# Patient Record
Sex: Female | Born: 1940
Health system: Southern US, Community
[De-identification: ages and names within clinical notes are randomized; demographics above are authoritative.]

## PROBLEM LIST (undated history)

## (undated) DIAGNOSIS — I499 Cardiac arrhythmia, unspecified: Secondary | ICD-10-CM

## (undated) DIAGNOSIS — I482 Chronic atrial fibrillation, unspecified: Secondary | ICD-10-CM

## (undated) DIAGNOSIS — R609 Edema, unspecified: Secondary | ICD-10-CM

## (undated) DIAGNOSIS — R011 Cardiac murmur, unspecified: Secondary | ICD-10-CM

## (undated) DIAGNOSIS — R0600 Dyspnea, unspecified: Secondary | ICD-10-CM

## (undated) DIAGNOSIS — R569 Unspecified convulsions: Secondary | ICD-10-CM

## (undated) DIAGNOSIS — B019 Varicella without complication: Secondary | ICD-10-CM

## (undated) DIAGNOSIS — N329 Bladder disorder, unspecified: Secondary | ICD-10-CM

## (undated) DIAGNOSIS — I10 Essential (primary) hypertension: Secondary | ICD-10-CM

## (undated) DIAGNOSIS — I639 Cerebral infarction, unspecified: Secondary | ICD-10-CM

## (undated) DIAGNOSIS — M419 Scoliosis, unspecified: Secondary | ICD-10-CM

## (undated) DIAGNOSIS — A419 Sepsis, unspecified organism: Secondary | ICD-10-CM

## (undated) DIAGNOSIS — J301 Allergic rhinitis due to pollen: Secondary | ICD-10-CM

## (undated) DIAGNOSIS — I82409 Acute embolism and thrombosis of unspecified deep veins of unspecified lower extremity: Secondary | ICD-10-CM

## (undated) DIAGNOSIS — I4891 Unspecified atrial fibrillation: Secondary | ICD-10-CM

## (undated) DIAGNOSIS — M1990 Unspecified osteoarthritis, unspecified site: Secondary | ICD-10-CM

## (undated) HISTORY — DX: Allergic rhinitis due to pollen: J30.1

## (undated) HISTORY — PX: COLONOSCOPY: SHX174

## (undated) HISTORY — PX: BREAST SURGERY: SHX581

## (undated) HISTORY — DX: Chronic atrial fibrillation, unspecified: I48.20

## (undated) HISTORY — DX: Unspecified atrial fibrillation: I48.91

## (undated) HISTORY — DX: Cardiac arrhythmia, unspecified: I49.9

## (undated) HISTORY — PX: DILATION AND CURETTAGE OF UTERUS: SHX78

## (undated) HISTORY — PX: LAMINECTOMY: SHX219

## (undated) HISTORY — DX: Scoliosis, unspecified: M41.9

## (undated) HISTORY — PX: ABLATION: SHX5711

## (undated) HISTORY — PX: TONSILLECTOMY: SHX5217

## (undated) HISTORY — PX: TOTAL KNEE ARTHROPLASTY: SHX125

## (undated) HISTORY — DX: Unspecified convulsions: R56.9

## (undated) HISTORY — PX: JOINT REPLACEMENT: SHX530

## (undated) HISTORY — DX: Cerebral infarction, unspecified: I63.9

## (undated) HISTORY — PX: BREAST BIOPSY: SHX20

## (undated) HISTORY — DX: Acute embolism and thrombosis of unspecified deep veins of unspecified lower extremity: I82.409

## (undated) HISTORY — DX: Varicella without complication: B01.9

## (undated) HISTORY — DX: Essential (primary) hypertension: I10

---
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
## 2001-10-23 HISTORY — PX: OTHER SURGICAL HISTORY: SHX169

## 2005-10-23 HISTORY — PX: OTHER SURGICAL HISTORY: SHX169

## 2005-10-23 HISTORY — PX: CARDIAC ELECTROPHYSIOLOGY MAPPING AND ABLATION: SHX1292

## 2007-06-12 ENCOUNTER — Ambulatory Visit: Payer: Self-pay | Admitting: General Practice

## 2007-07-12 ENCOUNTER — Ambulatory Visit: Payer: Self-pay | Admitting: General Practice

## 2007-07-26 ENCOUNTER — Ambulatory Visit: Payer: Self-pay | Admitting: General Practice

## 2009-01-19 ENCOUNTER — Encounter: Payer: Self-pay | Admitting: Cardiovascular Disease

## 2009-01-19 ENCOUNTER — Ambulatory Visit: Payer: Self-pay | Admitting: Cardiovascular Disease

## 2009-05-11 ENCOUNTER — Telehealth: Payer: Self-pay | Admitting: Cardiovascular Disease

## 2009-06-08 ENCOUNTER — Encounter: Payer: Self-pay | Admitting: Cardiovascular Disease

## 2009-06-29 ENCOUNTER — Ambulatory Visit: Payer: Self-pay | Admitting: Cardiovascular Disease

## 2009-06-29 DIAGNOSIS — I482 Chronic atrial fibrillation, unspecified: Secondary | ICD-10-CM | POA: Insufficient documentation

## 2009-06-29 DIAGNOSIS — R0602 Shortness of breath: Secondary | ICD-10-CM | POA: Insufficient documentation

## 2009-07-14 ENCOUNTER — Encounter: Payer: Self-pay | Admitting: Cardiovascular Disease

## 2009-07-14 LAB — CONVERTED CEMR LAB
BUN: 18 mg/dL
CO2: 22 meq/L
Calcium: 9.9 mg/dL
Chloride: 101 meq/L
Cholesterol: 197 mg/dL
Creatinine, Ser: 0.88 mg/dL
HDL: 56 mg/dL
LDL Cholesterol: 118 mg/dL
Potassium: 4.2 meq/L
Sodium: 139 meq/L
TSH: 2.27 microintl units/mL
Triglycerides: 117 mg/dL

## 2009-07-21 ENCOUNTER — Encounter: Payer: Self-pay | Admitting: Cardiovascular Disease

## 2009-09-07 ENCOUNTER — Encounter: Payer: Self-pay | Admitting: Cardiovascular Disease

## 2009-10-06 ENCOUNTER — Ambulatory Visit: Payer: Self-pay | Admitting: Surgery

## 2010-11-03 ENCOUNTER — Encounter: Payer: Self-pay | Admitting: Cardiovascular Disease

## 2010-11-22 NOTE — Progress Notes (Signed)
Summary: APPT  Phone Note Call from Patient Call back at Home Phone [phone]   Caller: SELF Call For: COOPER Summary of Call: PAT NEEDS AN APPT WITH A VEIN SPECIALIST BECAUSE THE STOCKINGS THAT COOPER PRESCRIBED DID NOT HELP. Initial call taken by: Harlon Flor,  May 11, 2009 10:44 AM  Follow-up for Phone Call        COOPER SAID TO REFER, WE WILL FOLLOW UP WITH DEW AND HEARN FOR AN APPOINTMENT. [facility] :) Follow-up by: Norva Karvonen, MD,  May 12, 2009 3:16 PM  Additional Follow-up for Phone Call Additional follow up Details #1::        noted, thanks.     Appended Document: APPT pt has appointment set up with dr dew 08/17 @ 9:30 am. We will send over the last O/V note. Cala Bradford :)

## 2010-11-22 NOTE — Letter (Signed)
Summary: Letter from [facility]  Letter from [facility]   Imported By: Harlon Flor 06/17/2009 11:48:45  _____________________________________________________________________  External Attachment:    Type:   Image     Comment:   External Document

## 2010-11-22 NOTE — Assessment & Plan Note (Signed)
Summary: f69m   Visit Type:  Follow-up Primary Provider:  Sherrie Mustache @ [facility]  CC:  6 month ROV;SOB/DOE.  History of Present Illness: This is a 70 year-old woman with chronic atrial fibrillation, HTN, and painful leg varicosities, presenting for follow-up today.  She fell onto her right chest one week ago, and has been feeling short of breath sporadically since then. She also c/o right sided chest pain with movement and deep breathing. Symptoms are improving slowly. No chest pressure, lightheadedness, edema, or syncope. Reports episodic fatigue that she relates to atrial fib. This occurs in spells, but denies palpitations or a feeling that her heart is racing. Has an upcoming appointment with Dr Wyn Quaker for a venous ultrasound to determine whether she is a candidate for treatment of her varicose veins.  No other complaints today.  Current Medications (verified): 1)  Metoprolol Tartrate 50 Mg Tabs (Metoprolol Tartrate) .... Take 1 1/2 Tablet By Mouth Twice A Day 2)  Warfarin Sodium 4 Mg Tabs (Warfarin Sodium) .... Use As Directed By Anticoagulation Clinic 3)  Co Q-10 100 Mg Caps (Coenzyme Q10) .Marland Kitchen.. 1 By Mouth Daily 4)  Cvs Vitamin E 400 Unit Caps (Vitamin E) .Marland Kitchen.. 1 By Mouth Daily 5)  Dilt-Cd 120 Mg Xr24h-Cap (Diltiazem Hcl Coated Beads) .Marland Kitchen.. 1 By Mouth Daily 6)  Digoxin 0.125 Mg Tabs (Digoxin) .Marland Kitchen.. 1 By Mouth Daily 7)  B Complex 100  Tabs (B Complex Vitamins) .Marland Kitchen.. 1 By Mouth Daily 8)  Vitamin C Cr 500 Mg Cr-Caps (Ascorbic Acid) .Marland Kitchen.. 1 By Mouth Daily 9)  Vitamin D 400mg  .... 1 By Mouth Daily 10)  All Day Calcium 600-500 Mg-Unit Xr24h-Tab (Calcium Carb-Cholecalciferol) .... 2 By Mouth Daily 11)  Eql Fish Oil 1000 Mg Caps (Omega-3 Fatty Acids) .Marland Kitchen.. 1 By Mouth Daily 12)  Probiotic Formula  Caps (Misc Intestinal Flora Regulat) .Marland Kitchen.. 1 By Mouth Daily 13)  Aspartate Mg & K 90-90 Mg Caps (Mag Aspart-Potassium Aspart) .Marland Kitchen.. 1 By Mouth Daily 14)  Chromium Picolinate Klb6  Tabs (Misc  Natural Products) .Marland Kitchen.. 1 By Mouth Daily (On Occasion) 15)  Acai Berry 500 Mg Caps (Acai) .... Take 2 Once Daily  Allergies (verified): No Known Drug Allergies  Past History:  Past medical history reviewed for relevance to current acute and chronic problems.  Past Medical History: Reviewed history from 01/19/2009 and no changes required. Chronic A-Fib Hx: DVT Hypertension  Review of Systems       Negative except as per HPI   Vital Signs:  Patient profile:   70 year old female Height:      62 inches Weight:      163 pounds BMI:     29.92 Pulse rate:   72 / minute BP sitting:   126 / 68  (left arm)  Vitals Entered By: Stanton Kidney, EMT-P (June 29, 2009 12:22 PM)  Physical Exam  General:  Pt is alert and oriented, in no acute distress. HEENT: normal Neck: normal carotid upstrokes without bruits, JVP normal Lungs: CTA CV: irregularly irregular without murmur or gallop Abd: soft, NT, positive BS, no bruit, no organomegaly Ext: no clubbing, cyanosis, or edema. peripheral pulses 2+ and equal. Lower extremity varicosities noted bilaterally. Skin: warm and dry without rash    EKG  Procedure date:  06/29/2009  Findings:      Atrial fibrillation, otherwise within normal limits.  Impression & Recommendations:  Problem # 1:  ATRIAL FIBRILLATION (ICD-427.31) Remains stable with apparent good rate-control. Anticoagulated chronically with coumadin.  She has upcoming physical with her PCP. Will ask that Dig level be drawn with her annual bloodwork. I discussed the option of an Event Recorder to determine if her episodic fatigue is related to herat rhythm, but she declined.  Her updated medication list for this problem includes:    Metoprolol Tartrate 50 Mg Tabs (Metoprolol tartrate) .Marland Kitchen... Take 1 1/2 tablet by mouth twice a day    Warfarin Sodium 4 Mg Tabs (Warfarin sodium) ..... Use as directed by anticoagulation clinic    Digoxin 0.125 Mg Tabs (Digoxin) .Marland Kitchen... 1 by mouth  daily  Orders: T-Digoxin ([address]-60454) EKG w/ Interpretation (93000)  Problem # 2:  SHORTNESS OF BREATH (ICD-786.05) Symptoms have occurred since her fall, and she also has pleuritic chest pain. Will check chest XRay with rib series to rule out rib fracture or other abnormality.  Her updated medication list for this problem includes:    Metoprolol Tartrate 50 Mg Tabs (Metoprolol tartrate) .Marland Kitchen... Take 1 1/2 tablet by mouth twice a day    Dilt-cd 120 Mg Xr24h-cap (Diltiazem hcl coated beads) .Marland Kitchen... 1 by mouth daily    Digoxin 0.125 Mg Tabs (Digoxin) .Marland Kitchen... 1 by mouth daily  Orders: T-2 View CXR (71020TC)  Patient Instructions: 1)  Your physician recommends that you schedule a follow-up appointment in: 6 months 2)  Your physician recommends that you get Dig level drawn by your primary care doctor.  3)  Your physician recommends that you continue on your current medications as directed. Please refer to the Current Medication list given to you today. 4)  A chest x-ray takes a picture of the organs and structures inside the chest, including the heart, lungs, and blood vessels. This test can show several things, including, whether the heart is enlarged; whether fluid is building up in the lungs; and whether pacemaker / defibrillator leads are still in place. Prescriptions: DIGOXIN 0.125 MG TABS (DIGOXIN) 1 by mouth daily  #90 x 3   Entered by:   Mercer Pod   Authorized by:   Norva Karvonen, MD   Signed by:   Mercer Pod on 06/29/2009   Method used:   Electronically to        [facility] [address] (retail)       [address], [address]       [place], [place]  [address]       Ph: [phone]       Fax: [phone]   RxID:   [id number] DILT-CD 120 MG XR24H-CAP (DILTIAZEM HCL COATED BEADS) 1 by mouth daily  #90 x 3   Entered by:   Mercer Pod   Authorized by:   Norva Karvonen, MD   Signed by:   Mercer Pod on  06/29/2009   Method used:   Electronically to        [facility] [address] (retail)       [address], [address]       [place], [place]  [address]       Ph: [phone]       Fax: [phone]   RxID:   [id number] METOPROLOL TARTRATE 50 MG TABS (METOPROLOL TARTRATE) Take 1 1/2 tablet by mouth twice a day  #270 x 3   Entered by:   Mercer Pod   Authorized by:   Norva Karvonen, MD   Signed by:   Mercer Pod  on 06/29/2009   Method used:   Electronically to        [facility] [address] (retail)       [address]       [place], [place]  [address]       Ph: [phone]       Fax: [phone]   RxID:   [id number]

## 2010-11-22 NOTE — Assessment & Plan Note (Signed)
Summary: [facility]   Visit Type:  New Patient Referring Provider:  Merrily Pew Primary Provider:  Sherrie Mustache @ [facility]   History of Present Illness: see dictated office note  Current Medications (verified): 1)  Metoprolol Tartrate 50 Mg Tabs (Metoprolol Tartrate) .... Take 1 1/2 Tablet By Mouth Twice A Day 2)  Warfarin Sodium 4 Mg Tabs (Warfarin Sodium) .... Use As Directed By Anticoagulation Clinic 3)  Co Q-10 100 Mg Caps (Coenzyme Q10) .Marland Kitchen.. 1 By Mouth Daily 4)  Cvs Vitamin E 400 Unit Caps (Vitamin E) .Marland Kitchen.. 1 By Mouth Daily 5)  Dilt-Cd 120 Mg Xr24h-Cap (Diltiazem Hcl Coated Beads) .Marland Kitchen.. 1 By Mouth Daily 6)  Celebrex 200 Mg Caps (Celecoxib) .Marland Kitchen.. 1 By Mouth Daily As Needed 7)  Digoxin 0.125 Mg Tabs (Digoxin) .Marland Kitchen.. 1 By Mouth Daily 8)  Vitamin A 8000iu .Marland Kitchen.. 1 By Mouth Daily 9)  B Complex 100  Tabs (B Complex Vitamins) .Marland Kitchen.. 1 By Mouth Daily 10)  Vitamin C Cr 500 Mg Cr-Caps (Ascorbic Acid) .Marland Kitchen.. 1 By Mouth Daily 11)  Vitamin D 400mg  .... 1 By Mouth Daily 12)  All Day Calcium 600-500 Mg-Unit Xr24h-Tab (Calcium Carb-Cholecalciferol) .... 2 By Mouth Daily 13)  Eql Fish Oil 1000 Mg Caps (Omega-3 Fatty Acids) .Marland Kitchen.. 1 By Mouth Daily 14)  Tonalin 1 Gram .... 1 By Mouth Daily 15)  Probiotic Formula  Caps (Misc Intestinal Flora Regulat) .Marland Kitchen.. 1 By Mouth Daily 16)  Aspartate Mg & K 90-90 Mg Caps (Mag Aspart-Potassium Aspart) .Marland Kitchen.. 1 By Mouth Daily 17)  Chromium Picolinate Klb6  Tabs (Misc Natural Products) .Marland Kitchen.. 1 By Mouth Daily  Allergies (verified): No Known Drug Allergies  Vital Signs:  Patient profile:   70 year old female Height:      5.3 inches Weight:      168.25 pounds BMI:     4226.42 Pulse rate:   66 / minute Pulse rhythm:   irregular BP sitting:   116 / 72  (left arm) Cuff size:   regular  Vitals Entered By: Mercer Pod (January 19, 2009 11:39 AM)

## 2010-11-22 NOTE — Miscellaneous (Signed)
Summary: labs from Dr Sherrie Mustache  Clinical Lists Changes  Observations: Added new observation of TSH: 2.270 microintl units/mL (07/14/2009 16:14) Added new observation of CALCIUM: 9.9 mg/dL (98/08/9146 82:95) Added new observation of CO2 PLSM/SER: 22 meq/L (07/14/2009 16:14) Added new observation of CL SERUM: 101 meq/L (07/14/2009 16:14) Added new observation of K SERUM: 4.2 meq/L (07/14/2009 16:14) Added new observation of NA: 139 meq/L (07/14/2009 16:14) Added new observation of CREATININE: .88 mg/dL (62/13/0865 78:46) Added new observation of BUN: 18 mg/dL (96/29/5284 13:24) Added new observation of LDL: 118 mg/dL (40/07/2724 36:64) Added new observation of HDL: 56 mg/dL (40/34/7425 95:63) Added new observation of TRIGLYC TOT: 117 mg/dL (87/56/4332 95:18) Added new observation of CHOLESTEROL: 197 mg/dL (84/16/6063 01:60)

## 2010-11-22 NOTE — Progress Notes (Signed)
Summary: [facility]  [facility]   Imported By: Harlon Flor 09/21/2009 12:31:37  _____________________________________________________________________  External Attachment:    Type:   Image     Comment:   External Document

## 2010-12-07 ENCOUNTER — Ambulatory Visit (INDEPENDENT_AMBULATORY_CARE_PROVIDER_SITE_OTHER): Payer: Medicare Other | Admitting: Cardiovascular Disease

## 2010-12-07 ENCOUNTER — Encounter: Payer: Self-pay | Admitting: Cardiovascular Disease

## 2010-12-07 DIAGNOSIS — I10 Essential (primary) hypertension: Secondary | ICD-10-CM

## 2010-12-07 DIAGNOSIS — I4891 Unspecified atrial fibrillation: Secondary | ICD-10-CM

## 2010-12-14 NOTE — Assessment & Plan Note (Signed)
Summary: NP-ESTABLISH CARE/SAB   Visit Type:  Initial Consult Referring Provider:  Merrily Pew Primary Provider:  Sherrie Mustache @ [facility]  CC:  Occas. has shortness of breath..  History of Present Illness: This is a 70 year-old woman with chronic atrial fibrillation, atrial flutter ablation, HTN, and painful leg varicosities, presenting for follow-up today.  she does have chronic fatigue in the evenings. This has been coming on for one year. She works out at [employer] 3-4 times per week and has no symptoms with shortness of breath or chest pain. She is stressed as she lives with her daughter and 4 grandchildren. No tachycardia palpitations. She did reduce her metoprolol tartrate from 75 mg b.i.d. to 50 mg b.i.d. secondary to hypotension and fatigue.  No other complaints today.  old EKG done at [facility] family practice shows atrial fibrillation with ventricular rate 74 beats per minute, no significant ST or T wave changes, left axis deviation  Preventive Screening-Counseling & Management  Alcohol-Tobacco     Smoking Status: never  Caffeine-Diet-Exercise     Does Patient Exercise: yes  Current Medications (verified): 1)  Metoprolol Tartrate 50 Mg Tabs (Metoprolol Tartrate) .... Take 1 1/2 Tablet By Mouth Twice A Day 2)  Warfarin Sodium 5 Mg Tabs (Warfarin Sodium) .... Take As Directed By Pcp 3)  Co Q-10 100 Mg Caps (Coenzyme Q10) .Marland Kitchen.. 1 By Mouth Daily 4)  Cvs Vitamin E 400 Unit Caps (Vitamin E) .Marland Kitchen.. 1 By Mouth Daily 5)  Dilt-Cd 120 Mg Xr24h-Cap (Diltiazem Hcl Coated Beads) .Marland Kitchen.. 1 By Mouth Daily 6)  Digoxin 0.125 Mg Tabs (Digoxin) .Marland Kitchen.. 1 By Mouth Daily 7)  B Complex 100  Tabs (B Complex Vitamins) .Marland Kitchen.. 1 By Mouth Daily 8)  Vitamin C Cr 500 Mg Cr-Caps (Ascorbic Acid) .Marland Kitchen.. 1 By Mouth Daily 9)  Vitamin D 400mg  .... 1 By Mouth Daily 10)  All Day Calcium 600-500 Mg-Unit Xr24h-Tab (Calcium Carb-Cholecalciferol) .... 2 By Mouth Daily 11)  Eql Fish Oil 1000 Mg Caps (Omega-3  Fatty Acids) .Marland Kitchen.. 1 By Mouth Daily 12)  Probiotic Formula  Caps (Misc Intestinal Flora Regulat) .Marland Kitchen.. 1 By Mouth Daily 13)  Aspartate Mg & K 90-90 Mg Caps (Mag Aspart-Potassium Aspart) .Marland Kitchen.. 1 By Mouth Daily 14)  Chromium Picolinate Klb6  Tabs (Misc Natural Products) .Marland Kitchen.. 1 By Mouth Daily (On Occasion) 15)  Gabapentin 100 Mg Caps (Gabapentin) .... One Capsule Two Times A Day  Allergies (verified): No Known Drug Allergies  Past History:  Family History: Last updated: 2011/01/02 Father: Deceased; unknown age 21 Mother:Deceased age 63  Social History: Last updated: 01/02/11 Retired  Divorced  Tobacco Use - No.  Alcohol Use - yes--wine occas. Regular Exercise - yes--[employer] 3 x weekly; walks her dog.  Risk Factors: Exercise: yes (02-Jan-2011)  Risk Factors: Smoking Status: never (01-02-11)  Past Medical History: Chronic A-Fib--Ablation 2007 Hx: DVT Hypertension  Past Surgical History: Ablastion 2007; [place] tonsillectomy bilateral wrist surgery 2003  Family History: Father: Deceased; unknown age 17 Mother:Deceased age 58  Social History: Retired  Divorced  Tobacco Use - No.  Alcohol Use - yes--wine occas. Regular Exercise - yes--[employer] 3 x weekly; walks her dog. Smoking Status:  never Does Patient Exercise:  yes  Review of Systems  The patient denies fever, weight loss, weight gain, vision loss, decreased hearing, hoarseness, chest pain, syncope, dyspnea on exertion, peripheral edema, prolonged cough, abdominal pain, incontinence, muscle weakness, depression, and enlarged lymph nodes.         fatigue  Vital Signs:  Patient profile:   70 year old female Height:      62 inches Weight:      157.75 pounds BMI:     28.96 Pulse rate:   80 / minute Pulse rhythm:   irregular BP sitting:   120 / 82  (left arm) Cuff size:   regular  Vitals Entered By: Bishop Dublin, CMA (December 07, 2010 11:14 AM)  Physical Exam  General:  Pt is alert and  oriented, in no acute distress. HEENT: normal Neck: normal carotid upstrokes without bruits, JVP normal Lungs: CTA CV: irregularly irregular without murmur or gallop Abd: soft, NT, positive BS, no bruit, no organomegaly Ext: no clubbing, cyanosis, or edema. peripheral pulses 2+ and equal. Lower extremity varicosities noted bilaterally. Skin: warm and dry without rash  Msk:  Back normal, normal gait. Muscle strength and tone normal. Neurologic:  Alert and oriented x 3. Psych:  Normal affect.   Impression & Recommendations:  Problem # 1:  ATRIAL FIBRILLATION (ICD-427.31) rate is well controlled, no medication changes made. We did discuss the various treatment options with her which would include repeat echocardiogram and possible cardioversion. She would like to think about this as she is relatively asymptomatic. Continue anticoagulation, followed at [facility] family [facility]. We will try to obtain the blood work from Dr. Sherrie Mustache for our records including digoxin level.  Her updated medication list for this problem includes:    Metoprolol Tartrate 50 Mg Tabs (Metoprolol tartrate) .Marland Kitchen... Take 1 tablet by mouth two times a day.    Warfarin Sodium 5 Mg Tabs (Warfarin sodium) .Marland Kitchen... Take as directed by pcp    Digoxin 0.125 Mg Tabs (Digoxin) .Marland Kitchen... 1 by mouth daily  Problem # 2:  HYPERTENSION, BENIGN (ICD-401.1) Well-controlled on her current medication regimen.  Her updated medication list for this problem includes:    Metoprolol Tartrate 50 Mg Tabs (Metoprolol tartrate) .Marland Kitchen... Take 1 tablet by mouth two times a day.    Dilt-cd 120 Mg Xr24h-cap (Diltiazem hcl coated beads) .Marland Kitchen... 1 by mouth daily  Problem # 3:  PREVENTIVE HEALTH CARE (ICD-V70.0) We will try to obtain her most recent lipid panel for a records  Patient Instructions: 1)  Your physician recommends that you schedule a follow-up appointment in: 1 year 2)  Your physician recommends that you continue on your current medications  as directed. Please refer to the Current Medication list given to you today.

## 2011-02-06 ENCOUNTER — Encounter: Payer: Self-pay | Admitting: Cardiovascular Disease

## 2011-03-07 NOTE — Assessment & Plan Note (Signed)
[facility] OFFICE NOTE   NAME:[name], [name]                    MRN:          [id number]  DATE:01/19/2009                            DOB:          [birth year]    REASON FOR EVALUATION:  Atrial fibrillation.   HISTORY OF PRESENT ILLNESS:  [name] [name] is a very nice 70 year old  woman here to establish care.  She has been followed by Dr. Mariah Milling with  [facility].  She is changing to our care, as Dr. Mariah Milling has moved to  [facility].  The patient has a long-standing history of atrial  fibrillation.  She has been managed with rate control and Coumadin for  anticoagulation.  She underwent an ablation procedure in [place] in  2007.  She is not sure of the details, but thinks may have been done for  atrial flutter.  Overall, she has done well with respect to her atrial  fib.  She has no history of TIA or stroke.  She has occasional episodes  of palpitations.  She had a pronounced episode approximately 1 week ago  while shopping.  She developed palpitations and felt very poorly during  an approximate 30-minute time period.  She ultimately was able to abort  the episode with a Valsalva maneuver.  This is her only recent episode  of similar symptoms.  She denies chest pain, dyspnea, edema, orthopnea,  PND, lightheadedness, or near-syncope.   Studies reviewed showed an echocardiogram from September 2008,  demonstrating normal left ventricular size and systolic function with an  LVEF of greater than 55%.  There was mild enlargement of the left atrium  with only trace valvular disease.  The patient also underwent an  adenosine myocardial perfusion study in September 2008 that showed  normal perfusion in all regions with a gated LVEF of 57%.  There is no  significant ischemia.   MEDICATIONS:  1. Digoxin 0.125 mg daily.  2. Metoprolol 75 mg twice daily.  3. Diltiazem ER 120 mg daily.  4. Warfarin as directed.  5.  Vitamin A.  6. Vitamin B.  7. Vitamin B12.  8. Vitamin C.  9. Vitamin D.  10.Vitamin E.  11.Calcium 600 mg plus D twice daily.  12.Coenzyme Q10 100 mg daily.  13.Multiple other over-the-counter supplements.   ALLERGIES:  NKDA.   PAST MEDICAL HISTORY:  Hypertension, chronic atrial fibrillation as  outlined above, history of an ablation procedure, left knee arthroscopy  in 2008, surgical repair of bilateral wrist fracture in 2003, and  hypertension.   FAMILY HISTORY:  The patient's mother is 60 years old.  Her father died  at 1 after a hip fracture.  There is no history of premature coronary  artery disease in the family.   SOCIAL HISTORY:  The patient is divorced.  She has 2 children, both are  grown.  She has never smoked cigarettes.  She does not drink alcohol.  She exercises at [employer] 5 times per week and she walks 1 mile 3 times  per week.  She has lost a few pounds since starting [employer] approximately  1 month ago.  REVIEW OF SYSTEMS:  Twelve point review of systems was performed.  The  patient complains of painful varicosities of both lower extremities.  Also positive for seasonal allergies.  All other systems were reviewed  are negative except as outlined above.   PHYSICAL EXAMINATION:  GENERAL:  The patient is alert and oriented.  She  is in no acute distress.  VITAL SIGNS:  Weight 168 pounds, blood pressure 116/72, heart rate 66,  and respiratory rate is 12.  HEENT:  Normal.  NECK:  Normal carotid upstrokes, no bruits.  JVP normal.  No thyromegaly  or thyroid nodules.  LUNGS:  Clear bilaterally.  HEART:  The apex is discrete, nondisplaced.  Irregularly irregular.  There are no murmurs or gallops.  There is no right ventricular heave or  lift.  ABDOMEN:  Soft, nontender, no organomegaly.  No abdominal bruits.  BACK:  No CVA tenderness.  EXTREMITIES:  No clubbing, cyanosis, or edema.  There are varicosities  of the bilateral lower extremities that are painful to  palpation.  The  patient also has extensive spider veins noted.  There is no edema.  SKIN:  Warm and dry without rash.  NEUROLOGIC:  Cranial nerves II-XII are intact.  Strength is intact and  equal bilaterally.   EKG shows atrial fibrillation with left axis deviation, otherwise within  normal limits.   ASSESSMENT:  1. Chronic atrial fibrillation.  The patient appears to be doing well      with a strategy of rate control and anticoagulation.  I suspect her      episode of palpitations was related to another arrhythmia.  If she      has truly had long-standing atrial fibrillation, it would make      sense that her ablation may have been done for a supraventricular      tachycardia such as atrioventricular nodal reentry tachycardia.      Her symptoms sound more consistent with that, as the recent episode      was aborted by the Valsalva maneuver.  I offered her event      recording in an electrophysiology evaluation.  She prefers to defer      that for the present time.  We will see if she has any recurrent      episodes.  Continue Atrioventricular nodal blockade with digoxin,      metoprolol, and diltiazem at present.  2. Painful varicosities of the low extremities.  The patient was      written a prescription for medium compression thigh-high stockings      and was referred to Vascular Surgery for consideration of a venous      ablative procedure.  3. Hypertension.  Blood pressure is under excellent control on her      current regimen.  No changes were made.  4. Followup.  I would like to see the patient back in 6 months for      followup.  She was advised to call if she has recurrent symptomatic      palpitations.      Veverly Fells. Excell Seltzer, MD  Electronically Signed    MDC/MedQ  DD: 01/19/2009  DT: 01/20/2009  Job #: [id number]

## 2011-04-13 ENCOUNTER — Encounter: Payer: Self-pay | Admitting: Cardiovascular Disease

## 2011-12-12 ENCOUNTER — Ambulatory Visit (INDEPENDENT_AMBULATORY_CARE_PROVIDER_SITE_OTHER): Payer: Medicare Other | Admitting: Cardiovascular Disease

## 2011-12-12 ENCOUNTER — Encounter: Payer: Self-pay | Admitting: Cardiovascular Disease

## 2011-12-12 VITALS — BP 117/79 | HR 49 | Ht 62.0 in | Wt 164.0 lb

## 2011-12-12 DIAGNOSIS — I4891 Unspecified atrial fibrillation: Secondary | ICD-10-CM

## 2011-12-12 DIAGNOSIS — I10 Essential (primary) hypertension: Secondary | ICD-10-CM

## 2011-12-12 NOTE — Patient Instructions (Addendum)
Please decrease the diltiazem to one a day to raise your heart rate If you continue to have fatigue,  Cut the metoprolol in 1/2 to 25 mg twice a day   Please call us if you have new issues that need to be addressed before your next appt.  Your physician wants you to follow-up in: 6 months.  You will receive a reminder letter in the mail two months in advance. If you don't receive a letter, please call our office to schedule the follow-up appointment.

## 2011-12-12 NOTE — Assessment & Plan Note (Signed)
Heart rate is well controlled and she does continue to have fatigue. We have suggested that she decrease the diltiazem to one a day. If she continues to have fatigue, she could decrease the metoprolol tartrate from 50 mg b.i.d. To 25 mg b.i.d..

## 2011-12-12 NOTE — Assessment & Plan Note (Signed)
Blood pressure is well controlled on today's visit. Blood pressure medication changes as above.

## 2011-12-12 NOTE — Progress Notes (Signed)
Patient ID: [name] [name], female    DOB: 03/24/1941, 71 y.o.   MRN: [id number]  HPI Comments:  71 year-old woman with chronic atrial fibrillation, atrial flutter ablation, HTN, and painful leg varicosities, presenting for follow-up today.   Overall she has been doing well. She does have fatigue sometimes in her arms and legs. She tries to push herself on these days to go to the gym.  She works out at [employer] 3-4 times per week and has no symptoms with shortness of breath or chest pain.  No tachycardia palpitations. Heart rate does run low frequently. She started taking diltiazem 120 mg b.i.d. As that's what it was written on the label.   No other complaints today.   EKG shows atrial fibrillation with ventricular rate 55 beats per minute with no significant ST or T wave changes   Outpatient Encounter Prescriptions as of 12/12/2011  Medication Sig Dispense Refill  . B Complex Vitamins (VITAMIN-B COMPLEX PO) Take 100 mg by mouth 1 dose over 46 hours.        . Calcium Carb-Cholecalciferol (ALL DAY CALCIUM) 600-500 MG-UNIT TB24 Take by mouth.        . Coenzyme Q10 (CO Q-10) 100 MG CAPS Take 100 mg by mouth 1 dose over 46 hours.        . digoxin (LANOXIN) 0.125 MG tablet Take 125 mcg by mouth daily.        Marland Kitchen diltiazem (CARDIZEM CD) 120 MG 24 hr capsule Take 120 mg by mouth 2 (two) times daily.       Marland Kitchen gabapentin (NEURONTIN) 100 MG capsule Take 1 tablet by mouth Twice daily.      Marland Kitchen Hyaluronic Acid-Vitamin C (HYALURONIC ACID PO) Take 100 mg by mouth daily.      . LevOCARNitine (CARNITINE, L,) POWD by Does not apply route daily.      Jodelle Green Aspart-Potassium Aspart (ASPARTATE MG & K) 90-90 MG CAPS Take by mouth.        . metoprolol (LOPRESSOR) 50 MG tablet Take 50 mg by mouth 2 (two) times daily.       . Misc Natural Products (CHROMIUM PICOLINATE KLB6 PO) Take by mouth. Take one tablet daily.       . Omega-3 Fatty Acids (FISH OIL) 1000 MG CAPS Take 1,000 mg by mouth 1 dose over 46 hours.        .  Probiotic Product (PROBIOTIC FORMULA) CAPS Take by mouth.        . vitamin E 400 UNIT capsule Take 400 Units by mouth daily.        Marland Kitchen warfarin (COUMADIN) 4 MG tablet Take 4 mg by mouth daily.        . Ascorbic Acid (VITAMIN C) 500 MG tablet Take 500 mg by mouth daily.          Review of Systems  Constitutional: Positive for fatigue.  HENT: Negative.   Eyes: Negative.   Respiratory: Negative.   Cardiovascular: Negative.   Gastrointestinal: Negative.   Musculoskeletal: Negative.   Skin: Negative.   Neurological: Negative.   Hematological: Negative.   Psychiatric/Behavioral: Negative.   All other systems reviewed and are negative.    BP 117/79  Pulse 49  Ht 5\' 2"  (1.575 m)  Wt 164 lb (74.39 kg)  BMI 30.00 kg/m2  Physical Exam  Nursing note and vitals reviewed. Constitutional: She is oriented to person, place, and time. She appears well-developed and well-nourished.  HENT:  Head: Normocephalic.  Nose:  Nose normal.  Mouth/Throat: Oropharynx is clear and moist.  Eyes: Conjunctivae are normal. Pupils are equal, round, and reactive to light.  Neck: Normal range of motion. Neck supple. No JVD present.  Cardiovascular: Normal rate, S1 normal, S2 normal, normal heart sounds and intact distal pulses.  An irregularly irregular rhythm present. Exam reveals no gallop and no friction rub.   No murmur heard. Pulmonary/Chest: Effort normal and breath sounds normal. No respiratory distress. She has no wheezes. She has no rales. She exhibits no tenderness.  Abdominal: Soft. Bowel sounds are normal. She exhibits no distension. There is no tenderness.  Musculoskeletal: Normal range of motion. She exhibits no edema and no tenderness.  Lymphadenopathy:    She has no cervical adenopathy.  Neurological: She is alert and oriented to person, place, and time. Coordination normal.  Skin: Skin is warm and dry. No rash noted. No erythema.  Psychiatric: She has a normal mood and affect. Her behavior is  normal. Judgment and thought content normal.         Assessment and Plan

## 2012-02-21 ENCOUNTER — Emergency Department: Payer: Self-pay | Admitting: Unknown Physician Specialty

## 2012-07-02 ENCOUNTER — Encounter: Payer: Self-pay | Admitting: Cardiovascular Disease

## 2012-07-02 ENCOUNTER — Ambulatory Visit (INDEPENDENT_AMBULATORY_CARE_PROVIDER_SITE_OTHER): Payer: Medicare Other | Admitting: Cardiovascular Disease

## 2012-07-02 VITALS — BP 108/78 | HR 79 | Ht 62.0 in | Wt 162.5 lb

## 2012-07-02 DIAGNOSIS — I4891 Unspecified atrial fibrillation: Secondary | ICD-10-CM

## 2012-07-02 DIAGNOSIS — I10 Essential (primary) hypertension: Secondary | ICD-10-CM

## 2012-07-02 MED ORDER — DILTIAZEM HCL 30 MG PO TABS: 30.0000 mg | ORAL_TABLET | Freq: Three times a day (TID) | ORAL | Status: DC | PRN

## 2012-07-02 NOTE — Assessment & Plan Note (Signed)
Rate is sometimes elevated, particularly with exertion. Uncertain if this is secondary to deconditioning. We have suggested she try diltiazem 30 mg when necessary for tachycardia, particularly before going to the gym.

## 2012-07-02 NOTE — Assessment & Plan Note (Signed)
Blood pressure is well controlled on today's visit. No changes made to the medications. 

## 2012-07-02 NOTE — Patient Instructions (Addendum)
You are doing well. Please take diltiazem 30 mg as needed for fast heart rate  Please call us if you have new issues that need to be addressed before your next appt.  Your physician wants you to follow-up in: 6 months.  You will receive a reminder letter in the mail two months in advance. If you don't receive a letter, please call our office to schedule the follow-up appointment.

## 2012-07-02 NOTE — Progress Notes (Signed)
Patient ID: [name] [name], female    DOB: Sep 27, 1941, 71 y.o.   MRN: [id number]  HPI Comments:  71 year-old woman with chronic atrial fibrillation, atrial flutter ablation, HTN, and painful leg varicosities, presenting for follow-up today. She reports having problems with atrial fibrillation and arrhythmia for the past 10 years.   Overall she has been doing well. She does have fatigue sometimes in her arms and legs. She has not been going to the gym recently. She joined the [employer]. She does have neuropathy in her feet and takes gabapentin. She reports that sometimes her heart rate is high with exertion. Blood pressure has been running borderline low on her current medication regimen.   EKG shows atrial fibrillation with ventricular rate 79 beats per minute with no significant ST or T wave changes   Outpatient Encounter Prescriptions as of 07/02/2012  Medication Sig Dispense Refill  . Ascorbic Acid (VITAMIN C) 500 MG tablet Take 500 mg by mouth as needed.       Marland Kitchen CALCIUM-VITAMIN D PO Take 600 mg by mouth 2 (two) times daily.      . Coenzyme Q10 (CO Q-10) 100 MG CAPS Take 100 mg by mouth 1 day or 1 dose.        . digoxin (LANOXIN) 0.125 MG tablet Take 125 mcg by mouth daily.        Marland Kitchen diltiazem (CARDIZEM CD) 120 MG 24 hr capsule Take 120 mg by mouth daily.       Marland Kitchen gabapentin (NEURONTIN) 100 MG capsule Take 1 tablet by mouth Twice daily.      Marland Kitchen MAG ASPART-POTASSIUM ASPART PO Take by mouth. Takes 600-198 mg daily.      . metoprolol (LOPRESSOR) 50 MG tablet Take 50 mg by mouth 2 (two) times daily.       . Omega-3 Fatty Acids (FISH OIL) 1000 MG CAPS Take 1,000 mg by mouth 1 dose over 46 hours.        . Probiotic Product (PROBIOTIC FORMULA) CAPS Take by mouth.        . vitamin E 400 UNIT capsule Take 400 Units by mouth daily.        Marland Kitchen warfarin (COUMADIN) 5 MG tablet As directed.      . LevOCARNitine (CARNITINE, L,) POWD 100 mg daily.         Review of Systems  Constitutional: Positive for  fatigue.  HENT: Negative.   Eyes: Negative.   Respiratory: Negative.   Cardiovascular: Positive for palpitations.  Gastrointestinal: Negative.   Musculoskeletal: Negative.   Skin: Negative.   Neurological: Negative.   Hematological: Negative.   Psychiatric/Behavioral: Negative.   All other systems reviewed and are negative.    BP 108/78  Pulse 79  Ht 5\' 2"  (1.575 m)  Wt 162 lb 8 oz (73.71 kg)  BMI 29.72 kg/m2  Physical Exam  Nursing note and vitals reviewed. Constitutional: She is oriented to person, place, and time. She appears well-developed and well-nourished.  HENT:  Head: Normocephalic.  Nose: Nose normal.  Mouth/Throat: Oropharynx is clear and moist.  Eyes: Conjunctivae are normal. Pupils are equal, round, and reactive to light.  Neck: Normal range of motion. Neck supple. No JVD present.  Cardiovascular: Normal rate, S1 normal, S2 normal, normal heart sounds and intact distal pulses.  An irregularly irregular rhythm present. Exam reveals no gallop and no friction rub.   No murmur heard. Pulmonary/Chest: Effort normal and breath sounds normal. No respiratory distress. She has no wheezes. She has  no rales. She exhibits no tenderness.  Abdominal: Soft. Bowel sounds are normal. She exhibits no distension. There is no tenderness.  Musculoskeletal: Normal range of motion. She exhibits no edema and no tenderness.  Lymphadenopathy:    She has no cervical adenopathy.  Neurological: She is alert and oriented to person, place, and time. Coordination normal.  Skin: Skin is warm and dry. No rash noted. No erythema.  Psychiatric: She has a normal mood and affect. Her behavior is normal. Judgment and thought content normal.         Assessment and Plan

## 2012-07-04 ENCOUNTER — Other Ambulatory Visit: Payer: Self-pay

## 2012-07-04 MED ORDER — DILTIAZEM HCL 30 MG PO TABS: 30.0000 mg | ORAL_TABLET | Freq: Three times a day (TID) | ORAL | Status: DC | PRN

## 2012-07-04 NOTE — Telephone Encounter (Signed)
Refill sent for dilitiazem 30 mg take one tablet three times a day as needed.

## 2012-11-19 ENCOUNTER — Telehealth: Payer: Self-pay | Admitting: *Deleted

## 2012-11-19 NOTE — Telephone Encounter (Signed)
Pt was suggested she take fosmax. Wants to know if this is ok to take.

## 2012-11-21 NOTE — Telephone Encounter (Signed)
Should be fine.

## 2012-11-21 NOTE — Telephone Encounter (Signed)
Is this ok?

## 2012-11-22 NOTE — Telephone Encounter (Signed)
Pt informed Understanding verb 

## 2013-01-01 ENCOUNTER — Encounter: Payer: Self-pay | Admitting: Cardiovascular Disease

## 2013-01-01 ENCOUNTER — Ambulatory Visit (INDEPENDENT_AMBULATORY_CARE_PROVIDER_SITE_OTHER): Payer: Medicare Other | Admitting: Cardiovascular Disease

## 2013-01-01 VITALS — BP 118/62 | HR 60 | Ht 60.0 in | Wt 164.5 lb

## 2013-01-01 DIAGNOSIS — R0602 Shortness of breath: Secondary | ICD-10-CM

## 2013-01-01 DIAGNOSIS — I10 Essential (primary) hypertension: Secondary | ICD-10-CM

## 2013-01-01 DIAGNOSIS — I4891 Unspecified atrial fibrillation: Secondary | ICD-10-CM

## 2013-01-01 MED ORDER — METOPROLOL TARTRATE 50 MG PO TABS: 50.0000 mg | ORAL_TABLET | Freq: Two times a day (BID) | ORAL | Status: DC

## 2013-01-01 MED ORDER — DILTIAZEM HCL ER COATED BEADS 120 MG PO CP24: 120.0000 mg | ORAL_CAPSULE | Freq: Two times a day (BID) | ORAL | Status: DC

## 2013-01-01 NOTE — Assessment & Plan Note (Signed)
Blood pressure is well controlled on today's visit. No changes made to the medications. 

## 2013-01-01 NOTE — Assessment & Plan Note (Signed)
Heart rate is well controlled. No changes made to her medications. Continue anticoagulation

## 2013-01-01 NOTE — Patient Instructions (Addendum)
You are doing well. No medication changes were made.  Please call us if you have new issues that need to be addressed before your next appt.  Your physician wants you to follow-up in: 6 months.  You will receive a reminder letter in the mail two months in advance. If you don't receive a letter, please call our office to schedule the follow-up appointment.   

## 2013-01-01 NOTE — Progress Notes (Signed)
Patient ID: [name] [name], female    DOB: Nov 18, 1940, 72 y.o.   MRN: [id number]  HPI Comments:  72 year-old woman with chronic atrial fibrillation, atrial flutter ablation, HTN, and painful leg varicosities, presenting for follow-up today. She reports having problems with atrial fibrillation and arrhythmia for the past 10 years.   Overall she has been doing well.  She has not been going to the gym recently.  She does have neuropathy in her feet.  She reports having occasional leg pain, but sometimes improves with exertion. No significant shortness of breath, chest pain, lightheadedness or dizziness   EKG shows atrial fibrillation with ventricular rate 60 beats per minute with no significant ST or T wave changes   Outpatient Encounter Prescriptions as of 01/01/2013  Medication Sig Dispense Refill  . CALCIUM-VITAMIN D PO Take 600 mg by mouth 2 (two) times daily.      . Coenzyme Q10 (CO Q-10) 100 MG CAPS Take 100 mg by mouth 1 day or 1 dose.        . digoxin (LANOXIN) 0.125 MG tablet Take 125 mcg by mouth daily.        Marland Kitchen diltiazem (CARDIZEM CD) 120 MG 24 hr capsule Take 1 capsule (120 mg total) by mouth 2 (two) times daily.  180 capsule  3  . diltiazem (CARDIZEM) 30 MG tablet Take 1 tablet (30 mg total) by mouth 3 (three) times daily as needed.  90 tablet  3  . gabapentin (NEURONTIN) 100 MG capsule Take 1 tablet by mouth Twice daily.      Boris Lown Oil 300 MG CAPS Take 300 mg by mouth daily.      . LevOCARNitine (CARNITINE, L,) POWD 500 mg daily.       Marland Kitchen MAG ASPART-POTASSIUM ASPART PO Take by mouth. Takes 600-198 mg daily.      . metoprolol (LOPRESSOR) 50 MG tablet Take 1 tablet (50 mg total) by mouth 2 (two) times daily.  180 tablet  3  . Misc Natural Products (TART CHERRY ADVANCED PO) Take 1,200 mg by mouth daily.      . Probiotic Product (PROBIOTIC FORMULA) CAPS Take by mouth.        . Turmeric 500 MG CAPS Take 500 mg by mouth daily.      . vitamin E 400 UNIT capsule Take 400 Units by  mouth daily.        Marland Kitchen warfarin (COUMADIN) 5 MG tablet As directed.        Review of Systems  HENT: Negative.   Eyes: Negative.   Respiratory: Negative.   Cardiovascular: Positive for palpitations.  Gastrointestinal: Negative.   Musculoskeletal: Negative.   Skin: Negative.   Neurological: Negative.   Psychiatric/Behavioral: Negative.   All other systems reviewed and are negative.    BP 118/62  Pulse 60  Ht 5' (1.524 m)  Wt 164 lb 8 oz (74.617 kg)  BMI 32.13 kg/m2  Physical Exam  Nursing note and vitals reviewed. Constitutional: She is oriented to person, place, and time. She appears well-developed and well-nourished.  HENT:  Head: Normocephalic.  Nose: Nose normal.  Mouth/Throat: Oropharynx is clear and moist.  Eyes: Conjunctivae are normal. Pupils are equal, round, and reactive to light.  Neck: Normal range of motion. Neck supple. No JVD present.  Cardiovascular: Normal rate, S1 normal, S2 normal, normal heart sounds and intact distal pulses.  An irregularly irregular rhythm present. Exam reveals no gallop and no friction rub.   No murmur heard. Pulmonary/Chest: Effort  normal and breath sounds normal. No respiratory distress. She has no wheezes. She has no rales. She exhibits no tenderness.  Abdominal: Soft. Bowel sounds are normal. She exhibits no distension. There is no tenderness.  Musculoskeletal: Normal range of motion. She exhibits no edema and no tenderness.  Lymphadenopathy:    She has no cervical adenopathy.  Neurological: She is alert and oriented to person, place, and time. Coordination normal.  Skin: Skin is warm and dry. No rash noted. No erythema.  Psychiatric: She has a normal mood and affect. Her behavior is normal. Judgment and thought content normal.    Assessment and Plan

## 2013-02-10 ENCOUNTER — Other Ambulatory Visit: Payer: Self-pay

## 2013-02-10 MED ORDER — DILTIAZEM HCL 30 MG PO TABS: 30.0000 mg | ORAL_TABLET | Freq: Three times a day (TID) | ORAL | Status: DC | PRN

## 2013-02-10 NOTE — Telephone Encounter (Signed)
Pt states this needs to be 3 months supply

## 2013-05-15 ENCOUNTER — Ambulatory Visit: Payer: Self-pay | Admitting: Family Medicine

## 2013-07-02 ENCOUNTER — Ambulatory Visit (INDEPENDENT_AMBULATORY_CARE_PROVIDER_SITE_OTHER): Payer: Medicare Other | Admitting: Cardiovascular Disease

## 2013-07-02 ENCOUNTER — Encounter: Payer: Self-pay | Admitting: Cardiovascular Disease

## 2013-07-02 VITALS — BP 118/82 | HR 81 | Ht 62.0 in | Wt 160.5 lb

## 2013-07-02 DIAGNOSIS — R0602 Shortness of breath: Secondary | ICD-10-CM

## 2013-07-02 DIAGNOSIS — I4891 Unspecified atrial fibrillation: Secondary | ICD-10-CM

## 2013-07-02 DIAGNOSIS — R5381 Other malaise: Secondary | ICD-10-CM

## 2013-07-02 DIAGNOSIS — I10 Essential (primary) hypertension: Secondary | ICD-10-CM

## 2013-07-02 DIAGNOSIS — R5383 Other fatigue: Secondary | ICD-10-CM

## 2013-07-02 NOTE — Assessment & Plan Note (Signed)
She stopped digoxin on her own. Suggested she stay on diltiazem and metoprolol. Rate is reasonably well controlled. Tolerating anticoagulation.

## 2013-07-02 NOTE — Progress Notes (Signed)
Patient ID: [name] [name], female    DOB: August 21, 1941, 72 y.o.   MRN: [id number]  HPI Comments: 72 year-old woman with chronic atrial fibrillation for over 10 years, atrial flutter ablation in 2007, HTN, and painful leg varicosities, presenting for follow-up today.    Overall she has been doing well. Tired at baseline. She does not do much exercise.  She has not been going to the gym recently.  She does have neuropathy in her feet and has been taking Neurontin with minimal improvement.  She reports having leg pain, below her knees bilaterally. No significant shortness of breath, chest pain, lightheadedness or dizziness Recently Painted the bathroom in her house.  Stopped digoxin for uncertain reasons. Has not noticed any change in her heart rate with the way she feels without digoxin.   EKG shows atrial fibrillation with ventricular rate 81 beats per minute with no significant ST or T wave changes   Outpatient Encounter Prescriptions as of 07/02/2013  Medication Sig Dispense Refill  . CALCIUM-VITAMIN D PO Take 600 mg by mouth 2 (two) times daily.      . Coenzyme Q10 (CO Q-10) 100 MG CAPS Take 100 mg by mouth 1 day or 1 dose.        . diltiazem (CARDIZEM CD) 120 MG 24 hr capsule Take 1 capsule (120 mg total) by mouth 2 (two) times daily.  180 capsule  3  . diltiazem (CARDIZEM) 30 MG tablet Take 1 tablet (30 mg total) by mouth 3 (three) times daily as needed.  90 tablet  3  . gabapentin (NEURONTIN) 100 MG capsule Take 1 tablet by mouth daily.       Boris Lown Oil 300 MG CAPS Take 300 mg by mouth daily.      . LevOCARNitine (CARNITINE, L,) POWD 500 mg daily.       Marland Kitchen MAG ASPART-POTASSIUM ASPART PO Take by mouth. Takes 600-198 mg daily.      . metoprolol (LOPRESSOR) 50 MG tablet Take 1 tablet (50 mg total) by mouth 2 (two) times daily.  180 tablet  3  . Probiotic Product (PROBIOTIC FORMULA) CAPS Take by mouth.        . Turmeric 500 MG CAPS Take 500 mg by mouth daily.      . vitamin E 400 UNIT  capsule Take 400 Units by mouth daily.        Marland Kitchen warfarin (COUMADIN) 5 MG tablet As directed.       Review of Systems  HENT: Negative.   Eyes: Negative.   Respiratory: Negative.   Cardiovascular: Positive for palpitations.  Gastrointestinal: Negative.   Musculoskeletal: Negative.   Skin: Negative.   Neurological: Negative.   Psychiatric/Behavioral: Negative.   All other systems reviewed and are negative.    BP 118/82  Pulse 81  Ht 5\' 2"  (1.575 m)  Wt 160 lb 8 oz (72.802 kg)  BMI 29.35 kg/m2  Physical Exam  Nursing note and vitals reviewed. Constitutional: She is oriented to person, place, and time. She appears well-developed and well-nourished.  HENT:  Head: Normocephalic.  Nose: Nose normal.  Mouth/Throat: Oropharynx is clear and moist.  Eyes: Conjunctivae are normal. Pupils are equal, round, and reactive to light.  Neck: Normal range of motion. Neck supple. No JVD present.  Cardiovascular: Normal rate, S1 normal, S2 normal, normal heart sounds and intact distal pulses.  An irregularly irregular rhythm present. Exam reveals no gallop and no friction rub.   No murmur heard. Pulmonary/Chest: Effort normal and  breath sounds normal. No respiratory distress. She has no wheezes. She has no rales. She exhibits no tenderness.  Abdominal: Soft. Bowel sounds are normal. She exhibits no distension. There is no tenderness.  Musculoskeletal: Normal range of motion. She exhibits no edema and no tenderness.  Lymphadenopathy:    She has no cervical adenopathy.  Neurological: She is alert and oriented to person, place, and time. Coordination normal.  Skin: Skin is warm and dry. No rash noted. No erythema.  Psychiatric: She has a normal mood and affect. Her behavior is normal. Judgment and thought content normal.    Assessment and Plan

## 2013-07-02 NOTE — Assessment & Plan Note (Signed)
Blood pressure is well controlled on today's visit. No changes made to the medications. 

## 2013-07-02 NOTE — Patient Instructions (Addendum)
You are doing well. Ok to hold the digoxin  As Dr. Sherrie Mustache about medications for leg pain. cymbalta maybe?  Please call us if you have new issues that need to be addressed before your next appt.  Your physician wants you to follow-up in: 6 months.  You will receive a reminder letter in the mail two months in advance. If you don't receive a letter, please call our office to schedule the follow-up appointment.

## 2013-07-02 NOTE — Assessment & Plan Note (Signed)
Suspect she is deconditioning, not exercising much. Encouraged her to be active daily. Watch her diet.

## 2013-08-24 ENCOUNTER — Observation Stay: Payer: Self-pay | Admitting: Internal Medicine

## 2013-08-24 LAB — COMPREHENSIVE METABOLIC PANEL
Albumin: 3.8 g/dL (ref 3.4–5.0)
Alkaline Phosphatase: 89 U/L (ref 50–136)
Anion Gap: 3 — ABNORMAL LOW (ref 7–16)
BUN: 14 mg/dL (ref 7–18)
Bilirubin,Total: 0.6 mg/dL (ref 0.2–1.0)
Calcium, Total: 9.6 mg/dL (ref 8.5–10.1)
Chloride: 107 mmol/L (ref 98–107)
Co2: 28 mmol/L (ref 21–32)
Creatinine: 0.75 mg/dL (ref 0.60–1.30)
EGFR (African American): >60
EGFR (Non-African Amer.): >60
Glucose: 103 mg/dL — ABNORMAL HIGH (ref 65–99)
Osmolality: 276 (ref 275–301)
Potassium: 3.5 mmol/L (ref 3.5–5.1)
SGOT(AST): 23 U/L (ref 15–37)
SGPT (ALT): 26 U/L (ref 12–78)
Sodium: 138 mmol/L (ref 136–145)
Total Protein: 7.4 g/dL (ref 6.4–8.2)

## 2013-08-24 LAB — CBC
HCT: 41.8 % (ref 35.0–47.0)
HGB: 14.8 g/dL (ref 12.0–16.0)
MCH: 33.6 pg (ref 26.0–34.0)
MCHC: 35.4 g/dL (ref 32.0–36.0)
MCV: 95 fL (ref 80–100)
Platelet: 151 10*3/uL (ref 150–440)
RBC: 4.39 10*6/uL (ref 3.80–5.20)
RDW: 13.5 % (ref 11.5–14.5)
WBC: 8.7 10*3/uL (ref 3.6–11.0)

## 2013-08-24 LAB — URINALYSIS, COMPLETE
Bacteria: NONE SEEN
Bilirubin,UR: NEGATIVE
Glucose,UR: NEGATIVE mg/dL (ref 0–75)
Ketone: NEGATIVE
Nitrite: NEGATIVE
Ph: 6 (ref 4.5–8.0)
Protein: NEGATIVE
RBC,UR: 3 /HPF (ref 0–5)
Specific Gravity: 1.005 (ref 1.003–1.030)
Squamous Epithelial: <1
WBC UR: 6 /HPF (ref 0–5)

## 2013-08-24 LAB — CK TOTAL AND CKMB (NOT AT ARMC)
CK, Total: 84 U/L (ref 21–215)
CK-MB: 1.7 ng/mL (ref 0.5–3.6)

## 2013-08-24 LAB — TROPONIN I: Troponin-I: <0.02 ng/mL

## 2013-08-24 LAB — APTT: Activated PTT: 29.7 secs (ref 23.6–35.9)

## 2013-08-24 LAB — PROTIME-INR
INR: 1.6
Prothrombin Time: 18.9 secs — ABNORMAL HIGH (ref 11.5–14.7)

## 2013-08-25 LAB — BASIC METABOLIC PANEL
Anion Gap: 3 — ABNORMAL LOW (ref 7–16)
BUN: 15 mg/dL (ref 7–18)
Calcium, Total: 9.1 mg/dL (ref 8.5–10.1)
Chloride: 108 mmol/L — ABNORMAL HIGH (ref 98–107)
Co2: 28 mmol/L (ref 21–32)
Creatinine: 0.66 mg/dL (ref 0.60–1.30)
EGFR (African American): >60
EGFR (Non-African Amer.): >60
Glucose: 83 mg/dL (ref 65–99)
Osmolality: 278 (ref 275–301)
Potassium: 3.8 mmol/L (ref 3.5–5.1)
Sodium: 139 mmol/L (ref 136–145)

## 2013-08-25 LAB — CBC WITH DIFFERENTIAL/PLATELET
Basophil #: 0.1 10*3/uL (ref 0.0–0.1)
Basophil %: 1 %
Eosinophil #: 0.4 10*3/uL (ref 0.0–0.7)
Eosinophil %: 6.7 %
HCT: 39.4 % (ref 35.0–47.0)
HGB: 13.7 g/dL (ref 12.0–16.0)
Lymphocyte #: 1.8 10*3/uL (ref 1.0–3.6)
Lymphocyte %: 26.5 %
MCH: 33.1 pg (ref 26.0–34.0)
MCHC: 34.8 g/dL (ref 32.0–36.0)
MCV: 95 fL (ref 80–100)
Monocyte #: 0.6 x10 3/mm (ref 0.2–0.9)
Monocyte %: 8.8 %
Neutrophil #: 3.8 10*3/uL (ref 1.4–6.5)
Neutrophil %: 57 %
Platelet: 148 10*3/uL — ABNORMAL LOW (ref 150–440)
RBC: 4.15 10*6/uL (ref 3.80–5.20)
RDW: 13.1 % (ref 11.5–14.5)
WBC: 6.6 10*3/uL (ref 3.6–11.0)

## 2013-08-25 LAB — LIPID PANEL
Cholesterol: 150 mg/dL (ref 0–200)
HDL Cholesterol: 66 mg/dL — ABNORMAL HIGH (ref 40–60)
Ldl Cholesterol, Calc: 71 mg/dL (ref 0–100)
Triglycerides: 64 mg/dL (ref 0–200)
VLDL Cholesterol, Calc: 13 mg/dL (ref 5–40)

## 2013-08-25 LAB — PROTIME-INR
INR: 1.6
Prothrombin Time: 19.2 secs — ABNORMAL HIGH (ref 11.5–14.7)

## 2013-08-25 LAB — MAGNESIUM: Magnesium: 1.8 mg/dL

## 2013-08-25 LAB — HEMOGLOBIN A1C: Hemoglobin A1C: 5.4 % (ref 4.2–6.3)

## 2013-08-25 LAB — TSH: Thyroid Stimulating Horm: 2.18 u[IU]/mL

## 2013-08-26 LAB — PROTIME-INR
INR: 1.6
Prothrombin Time: 18.5 s — ABNORMAL HIGH

## 2013-08-29 ENCOUNTER — Other Ambulatory Visit: Payer: Self-pay | Admitting: Family Medicine

## 2013-08-29 LAB — CBC WITH DIFFERENTIAL/PLATELET
Basophil #: 0.1 10*3/uL (ref 0.0–0.1)
Basophil %: 1.4 %
Eosinophil #: 0.4 10*3/uL (ref 0.0–0.7)
Eosinophil %: 6 %
HCT: 41.7 % (ref 35.0–47.0)
HGB: 14.4 g/dL (ref 12.0–16.0)
Lymphocyte #: 0.7 10*3/uL — ABNORMAL LOW (ref 1.0–3.6)
Lymphocyte %: 11.9 %
MCH: 33.1 pg (ref 26.0–34.0)
MCHC: 34.4 g/dL (ref 32.0–36.0)
MCV: 96 fL (ref 80–100)
Monocyte #: 0.7 x10 3/mm (ref 0.2–0.9)
Monocyte %: 11.2 %
Neutrophil #: 4.1 10*3/uL (ref 1.4–6.5)
Neutrophil %: 69.5 %
Platelet: 158 10*3/uL (ref 150–440)
RBC: 4.34 10*6/uL (ref 3.80–5.20)
RDW: 13.2 % (ref 11.5–14.5)
WBC: 5.9 10*3/uL (ref 3.6–11.0)

## 2014-02-16 ENCOUNTER — Encounter: Payer: Self-pay | Admitting: Cardiovascular Disease

## 2014-02-16 ENCOUNTER — Ambulatory Visit: Payer: Medicare Other | Admitting: Cardiovascular Disease

## 2014-02-16 ENCOUNTER — Ambulatory Visit (INDEPENDENT_AMBULATORY_CARE_PROVIDER_SITE_OTHER): Payer: Commercial Managed Care - HMO | Admitting: Cardiovascular Disease

## 2014-02-16 VITALS — BP 121/71 | HR 76 | Ht 62.0 in | Wt 163.5 lb

## 2014-02-16 DIAGNOSIS — R0602 Shortness of breath: Secondary | ICD-10-CM

## 2014-02-16 DIAGNOSIS — G459 Transient cerebral ischemic attack, unspecified: Secondary | ICD-10-CM

## 2014-02-16 DIAGNOSIS — Z8673 Personal history of transient ischemic attack (TIA), and cerebral infarction without residual deficits: Secondary | ICD-10-CM | POA: Insufficient documentation

## 2014-02-16 DIAGNOSIS — R569 Unspecified convulsions: Secondary | ICD-10-CM

## 2014-02-16 DIAGNOSIS — I4891 Unspecified atrial fibrillation: Secondary | ICD-10-CM

## 2014-02-16 DIAGNOSIS — M79606 Pain in leg, unspecified: Secondary | ICD-10-CM

## 2014-02-16 DIAGNOSIS — M79609 Pain in unspecified limb: Secondary | ICD-10-CM

## 2014-02-16 DIAGNOSIS — I10 Essential (primary) hypertension: Secondary | ICD-10-CM

## 2014-02-16 DIAGNOSIS — G8929 Other chronic pain: Secondary | ICD-10-CM

## 2014-02-16 NOTE — Assessment & Plan Note (Signed)
Etiology of her leg pain is unclear. Possibly from arthritis. Encouraged she start a regular walking program as tolerated. Consider aquatic therapy.

## 2014-02-16 NOTE — Patient Instructions (Signed)
You are doing well. No medication changes were made.  Please call us if you have new issues that need to be addressed before your next appt.  Your physician wants you to follow-up in: 6 months.  You will receive a reminder letter in the mail two months in advance. If you don't receive a letter, please call our office to schedule the follow-up appointment.   

## 2014-02-16 NOTE — Assessment & Plan Note (Signed)
Chronic atrial fibrillation, heart rate well controlled. Back on warfarin with therapeutic INR
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
## 2014-02-16 NOTE — Assessment & Plan Note (Signed)
Chronic mild shortness of breath likely from atrial fibrillation, general deconditioning, mild obesity. Encouraged regular exercise
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
## 2014-02-16 NOTE — Assessment & Plan Note (Signed)
Suspected TIA in November 2014. INR at that time was 1.6

## 2014-02-16 NOTE — Assessment & Plan Note (Signed)
Blood pressure is well controlled on today's visit. No changes made to the medications. 

## 2014-02-20 ENCOUNTER — Ambulatory Visit: Payer: Commercial Managed Care - HMO | Admitting: Cardiovascular Disease

## 2014-08-17 ENCOUNTER — Ambulatory Visit (INDEPENDENT_AMBULATORY_CARE_PROVIDER_SITE_OTHER): Payer: Commercial Managed Care - HMO | Admitting: Cardiovascular Disease

## 2014-08-17 ENCOUNTER — Encounter: Payer: Self-pay | Admitting: Cardiovascular Disease

## 2014-08-17 VITALS — BP 110/78 | HR 54 | Ht 62.0 in | Wt 166.8 lb

## 2014-08-17 DIAGNOSIS — R0602 Shortness of breath: Secondary | ICD-10-CM

## 2014-08-17 DIAGNOSIS — I4891 Unspecified atrial fibrillation: Secondary | ICD-10-CM

## 2014-08-17 DIAGNOSIS — R569 Unspecified convulsions: Secondary | ICD-10-CM

## 2014-08-17 DIAGNOSIS — I10 Essential (primary) hypertension: Secondary | ICD-10-CM

## 2014-08-17 NOTE — Assessment & Plan Note (Signed)
No seizure over the past year. Suggested she talk with neurology about whether she can drive again

## 2014-08-17 NOTE — Assessment & Plan Note (Signed)
Blood pressure is well controlled on today's visit. No changes made to the medications. 

## 2014-08-17 NOTE — Assessment & Plan Note (Signed)
Shortness of breath likely secondary to deconditioning and sedentary lifestyle. Recommended she start a regular walking program

## 2014-08-17 NOTE — Assessment & Plan Note (Signed)
Heart rate well controlled. Tolerating warfarin. No recent falls
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
## 2014-08-17 NOTE — Patient Instructions (Signed)
You are doing well. No medication changes were made.  Try omeprazole for hoarse throat  For shortness of breath, restart an exercise program  Please call us if you have new issues that need to be addressed before your next appt.  Your physician wants you to follow-up in: 12 months.  You will receive a reminder letter in the mail two months in advance. If you don't receive a letter, please call our office to schedule the follow-up appointment.

## 2015-02-05 ENCOUNTER — Ambulatory Visit
Admit: 2015-02-05 | Disposition: A | Discharge status: Home / Self Care | Payer: Self-pay | Attending: Neurology | Admitting: Neurology

## 2015-02-12 DIAGNOSIS — I671 Cerebral aneurysm, nonruptured: Secondary | ICD-10-CM | POA: Insufficient documentation
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
## 2015-02-12 NOTE — H&P (Signed)
PATIENT NAME:  [name] [name], [name] [name] MR#:  [id number] DATE OF BIRTH:  1940-11-27  DATE OF ADMISSION:  08/24/2013  PRIMARY CARE PHYSICIAN:  Kirstie Peri. Caryn Section, MD  REFERRING PHYSICIAN:  Conni Slipper, MD  CHIEF COMPLAINT:  Slurred speech this afternoon.   HISTORY OF PRESENT ILLNESS: The patient is a 74 year old Caucasian female with a history of chronic A. fib on Coumadin, DVT, hypertension presenting to the ED with slurred speech today. The patient is alert, awake, oriented, in no acute distress. The patient said that the patient started to have slurred speech at 2:00 p.m. today which lasted about 7 minutes. Slurred speech disappeared but she still feels throat is uncomfortable now. The patient had a left frontal cortical contusion due to a fall on October 22nd. A CAT scan of head shows small hemorrhage. The patient was taken off Coumadin for 3 days and then resumed the Coumadin on October 25th according to neurologist's recommendation. The patient's INR is 1.6 today. The patient denies any weakness, numbness, no tingling. No loss of consciousness, syncope or incontinence.   PAST MEDICAL HISTORY: Hypertension, A. fib on Coumadin, history of DVT 45 years ago.   SOCIAL HISTORY: No smoking or drinking or illicit drugs.   FAMILY HISTORY: Mother had stroke and A. fib.    PAST SURGICAL HISTORY: Bilateral wrist surgery polypectomy.   REVIEW OF SYSTEMS: CONSTITUTIONAL: The patient denies any fever or chills. No headache or dizziness.  EYES: No double vision or blurred vision.  ENT: No postnasal drip but has slurred speech. No dysphagia.  CARDIOVASCULAR: No chest pain, palpitation, orthopnea or nocturnal dyspnea. No leg edema.  PULMONARY: No cough, sputum, shortness of breath. No hemoptysis.  GASTROINTESTINAL: No abdominal pain, nausea, vomiting or diarrhea. No melena or bloody stool.  GENITOURINARY: No dysuria, hematuria or incontinence.  SKIN: No rash or jaundice.  NEUROLOGY: No syncope, loss of  consciousness or seizure.  ENDOCRINOLOGY: No polyuria, polydipsia, heat or cold intolerance.  HEMATOLOGIC: No easy bruising or bleeding.   ALLERGIES:  None.  HOME MEDICATIONS:   1.  Coumadin 5 mg p.o. daily.  2.  Lopressor 50 mg p.o. b.i.d. 3.  Gabapentin 100 mg p.o. p.o. 2 tablets b.i.d.  4.  Diltiazem120 mg p.o. daily.   PHYSICAL EXAMINATION: VITAL SIGNS: Temperature 97.4, blood pressure 135/84, pulse 102, respirations 18, O2  saturation 98% on room air.  GENERAL: The patient is alert, awake, oriented, in no acute distress.  HEENT: Pupils round, equal and reactive to light and accommodation. No discharge from ear or nose. Moist oral mucosa. Clear oropharynx.  NECK: Supple. No JVD or carotid bruit. No lymphadenopathy. No thyromegaly.  CARDIOVASCULAR: S1, S2. Regular rate and rhythm. No murmurs or gallops.   PULMONARY: Bilateral air entry. No wheezing or rales. No use of accessory muscle to breathe.  ABDOMEN: Soft. No distention. No tenderness. No organomegaly. Bowel sounds present.  EXTREMITIES: No edema, clubbing or cyanosis. No calf tenderness. Strong bilateral pedal pulses.  SKIN: No rash or jaundice.  NEUROLOGIC: A and O x 3. No focal deficit. Power 5/5. Sensation intact to light.   LABORATORY AND RADIOLOGICAL DATA:   1.  CAT scan of head showed mild atrophy, no acute intracranial abnormality.  2.  CBC in normal range.  3.  Glucose 103, BUN 14, creatinine 0.75, electrolytes normal.  4.  Troponin less than 0.02.  5.  INR 1.6.  6.  Urinalysis is negative.  7.  EKG shows A. fib at 88 BPM.   IMPRESSIONS: 1.  Transient  ischemic attack.  2.  Need to rule out cerebrovascular accident.  3.  Chronic atrial fibrillation.  4.  Hypertension.  5.  History of deep venous thrombosis.   PLAN OF TREATMENT: 1.  The patient will be placed for observation. We will continue telemonitor, neuro checks. We will get an MRI of brain and echocardiograph.  2.  We will start aspirin and statin,  follow up lipid panel.  3.  We will give Lovenox 1 mg/kg subcutaneous q.12 hours and continue Coumadin. Follow up INR.  4.  Continue Lopressor, Cardizem.   I discussed the patient's condition and plan of treatment with the patient and the patient's daughter.   TIME SPENT: About 50 minutes.     ____________________________ Demetrios Loll, MD qc:cs D: 08/24/2013 19:05:37 ET T: 08/24/2013 19:39:07 ET JOB#: [id number]  cc: Demetrios Loll, MD, <Dictator> Demetrios Loll MD ELECTRONICALLY SIGNED 08/26/2013 11:34

## 2015-03-11 ENCOUNTER — Other Ambulatory Visit: Payer: Self-pay | Admitting: Family Medicine

## 2015-03-11 DIAGNOSIS — M810 Age-related osteoporosis without current pathological fracture: Secondary | ICD-10-CM

## 2015-03-11 DIAGNOSIS — Z1231 Encounter for screening mammogram for malignant neoplasm of breast: Secondary | ICD-10-CM

## 2015-03-25 ENCOUNTER — Other Ambulatory Visit: Payer: Commercial Managed Care - HMO

## 2015-03-25 ENCOUNTER — Ambulatory Visit: Payer: Commercial Managed Care - HMO

## 2015-03-29 ENCOUNTER — Other Ambulatory Visit: Payer: Commercial Managed Care - HMO

## 2015-03-29 ENCOUNTER — Ambulatory Visit: Payer: Commercial Managed Care - HMO

## 2015-03-29 ENCOUNTER — Telehealth: Payer: Self-pay

## 2015-03-29 NOTE — Telephone Encounter (Signed)
Please try patient again she tried to return your call.

## 2015-03-29 NOTE — Telephone Encounter (Signed)
Pt called, states she has had leg pain. No other symptoms. No heart pain. Foot has been swelling. Please call.

## 2015-03-29 NOTE — Telephone Encounter (Signed)
S/w patient who indicates she is having bilateral leg pain, more in the left than right which is chronic. States she has feeling in her legs, toes are normal color. Legs slightly edematous. Recommended to patient to elevate legs, follow low sodium diet, continue current medications and notify PCP of chronic leg pain. Pt verbalized understanding and stated she would contact PCP

## 2015-03-29 NOTE — Telephone Encounter (Signed)
Left message on machine for patient to contact the office.   

## 2015-03-30 ENCOUNTER — Other Ambulatory Visit: Payer: Commercial Managed Care - HMO

## 2015-03-30 ENCOUNTER — Ambulatory Visit: Payer: Commercial Managed Care - HMO

## 2015-03-30 MED ORDER — OXYCODONE HCL 5 MG PO CAPS: 5.0000 mg | ORAL_CAPSULE | Freq: Four times a day (QID) | ORAL | Status: DC | PRN

## 2015-03-30 NOTE — Telephone Encounter (Signed)
Pt is requesting a refill for Oxycodone HCl 10 MG TABS.  JW#[phone]/[id number]

## 2015-04-01 ENCOUNTER — Ambulatory Visit
Admission: RE | Admit: 2015-04-01 | Discharge: 2015-04-01 | Disposition: A | Discharge status: Home / Self Care | Payer: PPO | Source: Ambulatory Visit | Attending: Family Medicine | Admitting: Family Medicine

## 2015-04-01 DIAGNOSIS — M810 Age-related osteoporosis without current pathological fracture: Secondary | ICD-10-CM

## 2015-04-01 DIAGNOSIS — Z1231 Encounter for screening mammogram for malignant neoplasm of breast: Secondary | ICD-10-CM | POA: Diagnosis not present

## 2015-04-06 ENCOUNTER — Encounter: Payer: Self-pay | Admitting: Family Medicine

## 2015-04-06 ENCOUNTER — Ambulatory Visit (INDEPENDENT_AMBULATORY_CARE_PROVIDER_SITE_OTHER): Payer: PPO | Admitting: Family Medicine

## 2015-04-06 VITALS — BP 116/58 | HR 62 | Temp 98.2°F | Resp 16 | Ht 61.0 in | Wt 167.0 lb

## 2015-04-06 DIAGNOSIS — M170 Bilateral primary osteoarthritis of knee: Secondary | ICD-10-CM | POA: Diagnosis not present

## 2015-04-06 MED ORDER — CELECOXIB 200 MG PO CAPS: 200.0000 mg | ORAL_CAPSULE | Freq: Every day | ORAL | Status: DC

## 2015-04-06 NOTE — Patient Instructions (Signed)
Have your blood thinning time checked a few days after your start Celebrex. May take Tylenol up to to 3000 mg/ day

## 2015-04-06 NOTE — Progress Notes (Signed)
Subjective:     Patient ID: [name] [name], female   DOB: [birth year], 74 y.o.   MRN: [id number]  HPI  Chief Complaint  Patient presents with  . Knee Pain    patient reports over the last few days she has had pain and swelling in both of her knees but mostly in right, patient reports decreased mobility and having to use walker. Patient states the more active she is in the day the more pain she feels. She states that this is not related to any injury or accident  States she has prior hx of knee arthritis and left arthroscopy. Walks her dog twice daily for 20-30 minutes and that is when she feels the pain (R>L) and that her right lower leg is fuller.Has pressure stockings due to significant varicose veins but does not wear them. Takes up to 3 Tylenol for pain daily.   Review of Systems  Musculoskeletal: Negative for joint swelling (no falls).       Objective:   Physical Exam  Constitutional: She appears well-developed and well-nourished. No distress.  Musculoskeletal: She exhibits no edema or tenderness.  right knee with increased pain on flexion > 90 degrees. No increased warmth or erythema.     Assessment:     1. Primary osteoarthritis of both knees  - celecoxib (CELEBREX) 200 MG capsule; Take 1 capsule (200 mg total) by mouth daily. Take with food  Dispense: 14 capsule; Refill: 0    Plan:     Check pro-time in a few days while on Celebrex. May use Tylenol up to 3000 mg/day.

## 2015-04-06 NOTE — Telephone Encounter (Signed)
Pt is requesting the Rx for Celebrex sent to [facility] not the mail order/[facility]

## 2015-05-25 ENCOUNTER — Encounter
Admission: RE | Admit: 2015-05-25 | Discharge: 2015-05-25 | Disposition: A | Discharge status: Home / Self Care | Payer: PPO | Source: Ambulatory Visit | Attending: Ophthalmology | Admitting: Ophthalmology

## 2015-05-25 DIAGNOSIS — Z01812 Encounter for preprocedural laboratory examination: Secondary | ICD-10-CM | POA: Diagnosis not present

## 2015-05-25 DIAGNOSIS — Z01810 Encounter for preprocedural cardiovascular examination: Secondary | ICD-10-CM | POA: Insufficient documentation

## 2015-05-25 LAB — PROTIME-INR
INR: 2.75
Prothrombin Time: 29.2 seconds — ABNORMAL HIGH (ref 11.4–15.0)

## 2015-05-26 ENCOUNTER — Other Ambulatory Visit: Payer: Self-pay | Admitting: Family Medicine

## 2015-05-27 ENCOUNTER — Encounter: Payer: Self-pay | Admitting: *Deleted

## 2015-05-27 DIAGNOSIS — Z955 Presence of coronary angioplasty implant and graft: Secondary | ICD-10-CM | POA: Diagnosis not present

## 2015-05-27 DIAGNOSIS — I10 Essential (primary) hypertension: Secondary | ICD-10-CM | POA: Diagnosis not present

## 2015-05-27 DIAGNOSIS — M1990 Unspecified osteoarthritis, unspecified site: Secondary | ICD-10-CM | POA: Diagnosis not present

## 2015-05-27 DIAGNOSIS — M7989 Other specified soft tissue disorders: Secondary | ICD-10-CM | POA: Diagnosis not present

## 2015-05-27 DIAGNOSIS — Z86718 Personal history of other venous thrombosis and embolism: Secondary | ICD-10-CM | POA: Diagnosis not present

## 2015-05-27 DIAGNOSIS — I4891 Unspecified atrial fibrillation: Secondary | ICD-10-CM | POA: Diagnosis not present

## 2015-05-27 DIAGNOSIS — I25119 Atherosclerotic heart disease of native coronary artery with unspecified angina pectoris: Secondary | ICD-10-CM | POA: Diagnosis not present

## 2015-05-27 DIAGNOSIS — I252 Old myocardial infarction: Secondary | ICD-10-CM | POA: Diagnosis not present

## 2015-05-27 DIAGNOSIS — R011 Cardiac murmur, unspecified: Secondary | ICD-10-CM | POA: Diagnosis not present

## 2015-05-27 DIAGNOSIS — Z951 Presence of aortocoronary bypass graft: Secondary | ICD-10-CM | POA: Diagnosis not present

## 2015-05-27 DIAGNOSIS — H2511 Age-related nuclear cataract, right eye: Secondary | ICD-10-CM | POA: Diagnosis not present

## 2015-05-27 DIAGNOSIS — M810 Age-related osteoporosis without current pathological fracture: Secondary | ICD-10-CM | POA: Diagnosis not present

## 2015-05-28 ENCOUNTER — Emergency Department
Admission: EM | Admit: 2015-05-28 | Discharge: 2015-05-28 | Disposition: A | Discharge status: Home / Self Care | Payer: PPO | Attending: Emergency Medicine | Admitting: Emergency Medicine

## 2015-05-28 ENCOUNTER — Telehealth: Payer: Self-pay | Admitting: Family Medicine

## 2015-05-28 ENCOUNTER — Emergency Department: Payer: PPO

## 2015-05-28 ENCOUNTER — Encounter: Payer: Self-pay | Admitting: Emergency Medicine

## 2015-05-28 DIAGNOSIS — I10 Essential (primary) hypertension: Secondary | ICD-10-CM | POA: Diagnosis not present

## 2015-05-28 DIAGNOSIS — M545 Low back pain: Secondary | ICD-10-CM | POA: Diagnosis not present

## 2015-05-28 DIAGNOSIS — R109 Unspecified abdominal pain: Secondary | ICD-10-CM | POA: Diagnosis not present

## 2015-05-28 LAB — URINALYSIS COMPLETE WITH MICROSCOPIC (ARMC ONLY)
Bilirubin Urine: NEGATIVE
Glucose, UA: NEGATIVE mg/dL
Ketones, ur: NEGATIVE mg/dL
Leukocytes, UA: NEGATIVE
Nitrite: NEGATIVE
Protein, ur: NEGATIVE mg/dL
Specific Gravity, Urine: 1.009 (ref 1.005–1.030)
WBC, UA: NONE SEEN WBC/hpf (ref 0–5)
pH: 8 (ref 5.0–8.0)

## 2015-05-28 LAB — CBC WITH DIFFERENTIAL/PLATELET
Basophils Absolute: 0.1 10*3/uL (ref 0–0.1)
Basophils Relative: 1 %
Eosinophils Absolute: 0.2 10*3/uL (ref 0–0.7)
Eosinophils Relative: 2 %
HCT: 44.4 % (ref 35.0–47.0)
Hemoglobin: 14.8 g/dL (ref 12.0–16.0)
Lymphocytes Relative: 14 %
Lymphs Abs: 1.3 10*3/uL (ref 1.0–3.6)
MCH: 31.9 pg (ref 26.0–34.0)
MCHC: 33.4 g/dL (ref 32.0–36.0)
MCV: 95.5 fL (ref 80.0–100.0)
Monocytes Absolute: 0.7 10*3/uL (ref 0.2–0.9)
Monocytes Relative: 8 %
Neutro Abs: 6.7 10*3/uL — ABNORMAL HIGH (ref 1.4–6.5)
Neutrophils Relative %: 75 %
Platelets: 165 10*3/uL (ref 150–440)
RBC: 4.66 MIL/uL (ref 3.80–5.20)
RDW: 14 % (ref 11.5–14.5)
WBC: 9 10*3/uL (ref 3.6–11.0)

## 2015-05-28 LAB — COMPREHENSIVE METABOLIC PANEL
ALT: 23 U/L (ref 14–54)
AST: 31 U/L (ref 15–41)
Albumin: 4.4 g/dL (ref 3.5–5.0)
Alkaline Phosphatase: 59 U/L (ref 38–126)
Anion gap: 7 (ref 5–15)
BUN: 16 mg/dL (ref 6–20)
CO2: 27 mmol/L (ref 22–32)
Calcium: 9.5 mg/dL (ref 8.9–10.3)
Chloride: 104 mmol/L (ref 101–111)
Creatinine, Ser: 0.58 mg/dL (ref 0.44–1.00)
GFR calc Af Amer: >60 mL/min (ref >60)
GFR calc non Af Amer: >60 mL/min (ref >60)
Glucose, Bld: 89 mg/dL (ref 65–99)
Potassium: 4.1 mmol/L (ref 3.5–5.1)
Sodium: 138 mmol/L (ref 135–145)
Total Bilirubin: 0.6 mg/dL (ref 0.3–1.2)
Total Protein: 7.3 g/dL (ref 6.5–8.1)

## 2015-05-28 LAB — PROTIME-INR
INR: 2.72
Prothrombin Time: 28.9 seconds — ABNORMAL HIGH (ref 11.4–15.0)

## 2015-05-28 MED ORDER — MORPHINE SULFATE 4 MG/ML IJ SOLN: 4.0000 mg | Freq: Once | INTRAMUSCULAR | Status: DC

## 2015-05-28 MED ORDER — OXYCODONE-ACETAMINOPHEN 5-325 MG PO TABS: 1.0000 | ORAL_TABLET | Freq: Four times a day (QID) | ORAL | Status: DC | PRN

## 2015-05-28 MED ORDER — MORPHINE SULFATE 4 MG/ML IJ SOLN
4.0000 mg | Freq: Once | INTRAMUSCULAR | Status: AC
  Administered 2015-05-28: 4 mg via INTRAVENOUS
  Filled 2015-05-28: qty 1

## 2015-05-28 MED ORDER — KETOROLAC TROMETHAMINE 30 MG/ML IJ SOLN
15.0000 mg | Freq: Once | INTRAMUSCULAR | Status: AC
  Administered 2015-05-28: 15 mg via INTRAVENOUS
  Filled 2015-05-28: qty 1

## 2015-05-28 NOTE — Telephone Encounter (Signed)
Pt would lik esomeone to call her back.  She has test results that she needs to give Dr, Caryn Section.  [phone]  Thank [name]

## 2015-05-28 NOTE — ED Provider Notes (Signed)
[facility] Emergency Department Provider Note     Time seen: ----------------------------------------- 9:54 AM on 05/28/2015 -----------------------------------------    I have reviewed the triage vital signs and the nursing notes.   HISTORY  Chief Complaint Back Pain    HPI [name] [name] is a 74 y.o. female who presents ER with left lower back pain and left flank pain. Patient states sharp and stabbing, nothing makes it better or worse. States she felt it when she rolled over in bed last night but his been hurting her for the last week, denies any known injury, doesn't feel like her sciatica. Denies any fevers chills or other complaints.   Past Medical History  Diagnosis Date  . Chronic a-fib   . DVT (deep venous thrombosis)     H/O  . Hypertension   . Seizure   . Dysrhythmia     a fib  . Heart murmur   . Arthritis   . Edema     Patient Active Problem List   Diagnosis Date Noted  . TIA (transient ischemic attack) 02/16/2014  . Seizures 02/16/2014  . Chronic leg pain 02/16/2014  . Fatigue 07/02/2013  . HYPERTENSION, BENIGN 12/07/2010  . ATRIAL FIBRILLATION 06/29/2009  . SHORTNESS OF BREATH 06/29/2009    Past Surgical History  Procedure Laterality Date  . Ablation  [place], [place]  . Tonsillectomy    . Bilateral wrist surgery  2003  . Colonoscopy    . Tonsillectomy    . Breast surgery      Allergies Review of patient's allergies indicates no known allergies.  Social History History  Substance Use Topics  . Smoking status: Never Smoker   . Smokeless tobacco: Never Used  . Alcohol Use: No     Comment: wine- occasionally    Review of Systems Constitutional: Negative for fever. Eyes: Negative for visual changes. ENT: Negative for sore throat. Cardiovascular: Negative for chest pain. Respiratory: Negative for shortness of breath. Gastrointestinal: Positive for left flank pain, negative for vomiting and  diarrhea. Genitourinary: Negative for dysuria. Musculoskeletal: Positive for left-sided lower back pain Skin: Negative for rash. Neurological: Negative for headaches, focal weakness or numbness.  10-point ROS otherwise negative.  ____________________________________________   PHYSICAL EXAM:  VITAL SIGNS: ED Triage Vitals  Enc Vitals Group     BP 05/28/15 0948 137/48 mmHg     Pulse Rate 05/28/15 0948 78     Resp 05/28/15 0948 18     Temp 05/28/15 0948 97.9 F (36.6 C)     Temp Source 05/28/15 0948 Oral     SpO2 05/28/15 0948 99 %     Weight 05/28/15 0948 165 lb (74.844 kg)     Height 05/28/15 0948 5\' 1"  (1.549 m)     Head Cir --      Peak Flow --      Pain Score 05/28/15 0949 10     Pain Loc --      Pain Edu? --      Excl. in [place]? --     Constitutional: Alert and oriented. Well appearing and in no distress. Eyes: Conjunctivae are normal. PERRL. Normal extraocular movements. ENT   Head: Normocephalic and atraumatic.   Nose: No congestion/rhinnorhea.   Mouth/Throat: Mucous membranes are moist.   Neck: No stridor. Cardiovascular: Normal rate, regular rhythm. Normal and symmetric distal pulses are present in all extremities. No murmurs, rubs, or gallops. Respiratory: Normal respiratory effort without tachypnea nor retractions. Breath sounds are  clear and equal bilaterally. No wheezes/rales/rhonchi. Gastrointestinal: Soft and nontender. No distention. No abdominal bruits.  Musculoskeletal: Nontender with normal range of motion in all extremities. No joint effusions.  No lower extremity tenderness nor edema. Neurologic:  Normal speech and language. No gross focal neurologic deficits are appreciated. Speech is normal. No gait instability. Skin:  Skin is warm, dry and intact. No rash noted. Psychiatric: Mood and affect are normal. Speech and behavior are normal. Patient exhibits appropriate insight and judgment. ____________________________________________  ED  COURSE:  Pertinent labs & imaging results that were available during my care of the patient were reviewed by me and considered in my medical decision making (see chart for details). We'll check basic abdominal labs, consider CT ____________________________________________    LABS (pertinent positives/negatives)  Labs Reviewed  CBC WITH DIFFERENTIAL/PLATELET - Abnormal; Notable for the following:    Neutro Abs 6.7 (*)    All other components within normal limits  PROTIME-INR - Abnormal; Notable for the following:    Prothrombin Time 28.9 (*)    All other components within normal limits  COMPREHENSIVE METABOLIC PANEL  URINALYSIS COMPLETEWITH MICROSCOPIC ([facility] ONLY)    RADIOLOGY Images were viewed by me  CT renal protocol  IMPRESSION: 1. There is dextroscoliosis and degenerative changes lumbar spine are noted. 2. No hydronephrosis or hydroureter. 3. No calcified ureteral calculi. 4. No pericecal inflammation. 5. Distal colonic diverticulosis without evidence of acute diverticulitis. 6. No calcified calculi are noted within urinary bladder.  ____________________________________________  FINAL ASSESSMENT AND PLAN  Flank pain, low back pain  Plan: Patient with labs and imaging as dictated above. No clear etiology for the pain is identified other than possible scoliosis or arthritis. She'll be discharged with pain medication and encouraged close follow-up with her doctor to ensure improvement. Labs are otherwise unremarkable.   Earleen Newport, MD   Earleen Newport, MD 05/28/15 [phone]

## 2015-05-28 NOTE — Telephone Encounter (Signed)
Pt would like to give you information regarding test results .  Please return her call.  [phone]  Thanks [name]

## 2015-05-28 NOTE — Discharge Instructions (Signed)
Flank Pain °Flank pain refers to pain that is located on the side of the body between the upper abdomen and the back. The pain may occur over a short period of time (acute) or may be long-term or reoccurring (chronic). It may be mild or severe. Flank pain can be caused by many things. °CAUSES  °Some of the more common causes of flank pain include: °· Muscle strains.   °· Muscle spasms.   °· A disease of your spine (vertebral disk disease).   °· A lung infection (pneumonia).   °· Fluid around your lungs (pulmonary edema).   °· A kidney infection.   °· Kidney stones.   °· A very painful skin rash caused by the chickenpox virus (shingles).   °· Gallbladder disease.   °HOME CARE INSTRUCTIONS  °Home care will depend on the cause of your pain. In general, °· Rest as directed by your caregiver. °· Drink enough fluids to keep your urine clear or pale yellow. °· Only take over-the-counter or prescription medicines as directed by your caregiver. Some medicines may help relieve the pain. °· Tell your caregiver about any changes in your pain. °· Follow up with your caregiver as directed. °SEEK IMMEDIATE MEDICAL CARE IF:  °· Your pain is not controlled with medicine.   °· You have new or worsening symptoms. °· Your pain increases.   °· You have abdominal pain.   °· You have shortness of breath.   °· You have persistent nausea or vomiting.   °· You have swelling in your abdomen.   °· You feel faint or pass out.   °· You have blood in your urine. °· You have a fever or persistent symptoms for more than 2-3 days. °· You have a fever and your symptoms suddenly get worse. °MAKE SURE YOU:  °· Understand these instructions. °· Will watch your condition. °· Will get help right away if you are not doing well or get worse. °Document Released: 11/30/2005 Document Revised: 07/03/2012 Document Reviewed: 05/23/2012 °Exit[employer]® Patient Information ©2015 [employer]. This information is not intended to replace advice given to you by your  health care provider. Make sure you discuss any questions you have with your health care provider. ° °

## 2015-05-28 NOTE — ED Notes (Addendum)
Patient to ER for c/o left lower back pain/left flank pain. States she rolled over in bed last night and back was hurting. Denies any known injury. Patient has h/o sciatica, but states feels worse than normal sciatica pain.

## 2015-05-28 NOTE — Telephone Encounter (Signed)
Patient wanted to let us know she was in ER at [facility] and had labs done. Also, pt is having cataract surgery Tuesday 9 2016.

## 2015-05-28 NOTE — ED Notes (Signed)
Patient in room getting discharge papers at this time.

## 2015-06-01 ENCOUNTER — Ambulatory Visit
Admission: RE | Admit: 2015-06-01 | Discharge: 2015-06-01 | Disposition: A | Discharge status: Home / Self Care | Payer: PPO | Source: Ambulatory Visit | Attending: Ophthalmology | Admitting: Ophthalmology

## 2015-06-01 ENCOUNTER — Ambulatory Visit: Payer: PPO | Admitting: Anesthesiology

## 2015-06-01 ENCOUNTER — Encounter: Payer: Self-pay | Admitting: *Deleted

## 2015-06-01 ENCOUNTER — Encounter
Admission: RE | Disposition: A | Discharge status: Home / Self Care | Payer: Self-pay | Source: Ambulatory Visit | Attending: Ophthalmology

## 2015-06-01 DIAGNOSIS — R011 Cardiac murmur, unspecified: Secondary | ICD-10-CM | POA: Insufficient documentation

## 2015-06-01 DIAGNOSIS — H2511 Age-related nuclear cataract, right eye: Secondary | ICD-10-CM | POA: Insufficient documentation

## 2015-06-01 DIAGNOSIS — Z955 Presence of coronary angioplasty implant and graft: Secondary | ICD-10-CM | POA: Insufficient documentation

## 2015-06-01 DIAGNOSIS — M1990 Unspecified osteoarthritis, unspecified site: Secondary | ICD-10-CM | POA: Insufficient documentation

## 2015-06-01 DIAGNOSIS — I10 Essential (primary) hypertension: Secondary | ICD-10-CM | POA: Insufficient documentation

## 2015-06-01 DIAGNOSIS — Z86718 Personal history of other venous thrombosis and embolism: Secondary | ICD-10-CM | POA: Insufficient documentation

## 2015-06-01 DIAGNOSIS — M810 Age-related osteoporosis without current pathological fracture: Secondary | ICD-10-CM | POA: Insufficient documentation

## 2015-06-01 DIAGNOSIS — I25119 Atherosclerotic heart disease of native coronary artery with unspecified angina pectoris: Secondary | ICD-10-CM | POA: Insufficient documentation

## 2015-06-01 DIAGNOSIS — M7989 Other specified soft tissue disorders: Secondary | ICD-10-CM | POA: Insufficient documentation

## 2015-06-01 DIAGNOSIS — I4891 Unspecified atrial fibrillation: Secondary | ICD-10-CM | POA: Insufficient documentation

## 2015-06-01 DIAGNOSIS — I252 Old myocardial infarction: Secondary | ICD-10-CM | POA: Insufficient documentation

## 2015-06-01 DIAGNOSIS — Z951 Presence of aortocoronary bypass graft: Secondary | ICD-10-CM | POA: Insufficient documentation

## 2015-06-01 HISTORY — PX: CATARACT EXTRACTION W/PHACO: SHX586

## 2015-06-01 HISTORY — DX: Unspecified osteoarthritis, unspecified site: M19.90

## 2015-06-01 HISTORY — DX: Edema, unspecified: R60.9

## 2015-06-01 HISTORY — DX: Cardiac arrhythmia, unspecified: I49.9

## 2015-06-01 HISTORY — DX: Cardiac murmur, unspecified: R01.1

## 2015-06-01 LAB — PROTIME-INR
INR: 2.53
Prothrombin Time: 27.4 seconds — ABNORMAL HIGH (ref 11.4–15.0)

## 2015-06-01 SURGERY — PHACOEMULSIFICATION, CATARACT, WITH IOL INSERTION
Anesthesia: Monitor Anesthesia Care | Site: Eye | Laterality: Right | Wound class: Clean

## 2015-06-01 MED ORDER — MOXIFLOXACIN HCL 0.5 % OP SOLN: 1.0000 [drp] | OPHTHALMIC | Status: DC | PRN

## 2015-06-01 MED ORDER — CEFUROXIME OPHTHALMIC INJECTION 1 MG/0.1 ML
INJECTION | OPHTHALMIC | Status: AC
  Filled 2015-06-01: qty 0.1

## 2015-06-01 MED ORDER — FENTANYL CITRATE (PF) 100 MCG/2ML IJ SOLN
INTRAMUSCULAR | Status: DC | PRN
  Administered 2015-06-01: 50 ug via INTRAVENOUS

## 2015-06-01 MED ORDER — BSS IO SOLN
INTRAOCULAR | Status: DC | PRN
  Administered 2015-06-01: 200 mL via OPHTHALMIC

## 2015-06-01 MED ORDER — ARMC OPHTHALMIC DILATING GEL
OPHTHALMIC | Status: AC
  Administered 2015-06-01: 1 via OPHTHALMIC
  Filled 2015-06-01: qty 0.25

## 2015-06-01 MED ORDER — TETRACAINE HCL 0.5 % OP SOLN
1.0000 [drp] | OPHTHALMIC | Status: AC | PRN
  Administered 2015-06-01: 1 [drp] via OPHTHALMIC

## 2015-06-01 MED ORDER — TETRACAINE HCL 0.5 % OP SOLN
OPHTHALMIC | Status: AC
  Administered 2015-06-01: 1 [drp] via OPHTHALMIC
  Filled 2015-06-01: qty 2

## 2015-06-01 MED ORDER — ARMC OPHTHALMIC DILATING GEL
1.0000 "application " | OPHTHALMIC | Status: DC | PRN
  Administered 2015-06-01: 1 via OPHTHALMIC

## 2015-06-01 MED ORDER — NA CHONDROIT SULF-NA HYALURON 40-17 MG/ML IO SOLN
INTRAOCULAR | Status: DC | PRN
  Administered 2015-06-01: 1 mL via INTRAOCULAR

## 2015-06-01 MED ORDER — NA CHONDROIT SULF-NA HYALURON 40-17 MG/ML IO SOLN
INTRAOCULAR | Status: AC
  Filled 2015-06-01: qty 1

## 2015-06-01 MED ORDER — POVIDONE-IODINE 5 % OP SOLN
1.0000 "application " | OPHTHALMIC | Status: AC | PRN
  Administered 2015-06-01: 1 via OPHTHALMIC

## 2015-06-01 MED ORDER — CEFUROXIME OPHTHALMIC INJECTION 1 MG/0.1 ML
INJECTION | OPHTHALMIC | Status: DC | PRN
  Administered 2015-06-01: 0.1 mL via INTRACAMERAL

## 2015-06-01 MED ORDER — CARBACHOL 0.01 % IO SOLN
INTRAOCULAR | Status: DC | PRN
  Administered 2015-06-01: 0.5 mL via INTRAOCULAR

## 2015-06-01 MED ORDER — MIDAZOLAM HCL 2 MG/2ML IJ SOLN
INTRAMUSCULAR | Status: DC | PRN
  Administered 2015-06-01: 1 mg via INTRAVENOUS

## 2015-06-01 MED ORDER — MOXIFLOXACIN HCL 0.5 % OP SOLN
OPHTHALMIC | Status: DC | PRN
  Administered 2015-06-01: 1 [drp] via OPHTHALMIC

## 2015-06-01 MED ORDER — SODIUM CHLORIDE 0.9 % IV SOLN: INTRAVENOUS | Status: DC

## 2015-06-01 MED ORDER — POVIDONE-IODINE 5 % OP SOLN
OPHTHALMIC | Status: AC
  Administered 2015-06-01: 1 via OPHTHALMIC
  Filled 2015-06-01: qty 30

## 2015-06-01 MED ORDER — SODIUM CHLORIDE 0.9 % IV SOLN
INTRAVENOUS | Status: DC
  Administered 2015-06-01 (×2): via INTRAVENOUS

## 2015-06-01 MED ORDER — MOXIFLOXACIN HCL 0.5 % OP SOLN
OPHTHALMIC | Status: AC
  Filled 2015-06-01: qty 3

## 2015-06-01 MED ORDER — EPINEPHRINE HCL 1 MG/ML IJ SOLN
INTRAMUSCULAR | Status: AC
  Filled 2015-06-01: qty 1

## 2015-06-01 SURGICAL SUPPLY — 22 items
CANNULA ANT/CHMB 27GA (MISCELLANEOUS) ×2 IMPLANT
CUP MEDICINE 2OZ PLAST GRAD ST (MISCELLANEOUS) ×2 IMPLANT
GLOVE BIO SURGEON STRL SZ8 (GLOVE) ×2 IMPLANT
GLOVE BIOGEL M 6.5 STRL (GLOVE) ×2 IMPLANT
GLOVE SURG LX 8.0 MICRO (GLOVE) ×1
GLOVE SURG LX STRL 8.0 MICRO (GLOVE) ×1 IMPLANT
GOWN STRL REUS W/ TWL LRG LVL3 (GOWN DISPOSABLE) ×2 IMPLANT
GOWN STRL REUS W/TWL LRG LVL3 (GOWN DISPOSABLE) ×2
LENS IOL TECNIS 16.0 (Intraocular Lens) ×2 IMPLANT
LENS IOL TECNIS MONO 1P 16.0 (Intraocular Lens) ×1 IMPLANT
PACK CATARACT (MISCELLANEOUS) ×2 IMPLANT
PACK CATARACT BRASINGTON LX (MISCELLANEOUS) ×2 IMPLANT
PACK EYE AFTER SURG (MISCELLANEOUS) ×2 IMPLANT
SOL BSS BAG (MISCELLANEOUS) ×2
SOL PREP PVP 2OZ (MISCELLANEOUS) ×2
SOLUTION BSS BAG (MISCELLANEOUS) ×1 IMPLANT
SOLUTION PREP PVP 2OZ (MISCELLANEOUS) ×1 IMPLANT
SYR 3ML LL SCALE MARK (SYRINGE) ×2 IMPLANT
SYR 5ML LL (SYRINGE) ×2 IMPLANT
SYR TB 1ML 27GX1/2 LL (SYRINGE) ×2 IMPLANT
WATER STERILE IRR 1000ML POUR (IV SOLUTION) ×2 IMPLANT
WIPE NON LINTING 3.25X3.25 (MISCELLANEOUS) ×2 IMPLANT

## 2015-06-01 NOTE — Anesthesia Postprocedure Evaluation (Signed)
  Anesthesia Post-op Note  Patient: [name] [name]  Procedure(s) Performed: Procedure(s) with comments: CATARACT EXTRACTION PHACO AND INTRAOCULAR LENS PLACEMENT (IOC) (Right) - [place]: 01:02.0 AP%: 23.1 CDE: 14.33 Fluid lot# [id number]   Anesthesia type:MAC  Patient location: PACU  Post pain: Pain level controlled  Post assessment: Post-op Vital signs reviewed, Patient's Cardiovascular Status Stable, Respiratory Function Stable, Patent Airway and No signs of Nausea or vomiting  Post vital signs: Reviewed and stable  Last Vitals:  Filed Vitals:   06/01/15 1145  BP: 126/59  Pulse:   Temp:   Resp: 16    Level of consciousness: awake, alert  and patient cooperative  Complications: No apparent anesthesia complications

## 2015-06-01 NOTE — Anesthesia Procedure Notes (Addendum)
Procedure Name: MAC Date/Time: 06/01/2015 11:22 AM Performed by: Nelda Marseille Pre-anesthesia Checklist: Patient identified, Emergency Drugs available, Suction available, Patient being monitored and Timeout performed Oxygen Delivery Method: Nasal cannula

## 2015-06-01 NOTE — Discharge Instructions (Signed)
AMBULATORY SURGERY  DISCHARGE INSTRUCTIONS   1) The drugs that you were given will stay in your system until tomorrow so for the next 24 hours you should not:  A) Drive an automobile B) Make any legal decisions C) Drink any alcoholic beverage   2) You may resume regular meals tomorrow.  Today it is better to start with liquids and gradually work up to solid foods.  You may eat anything you prefer, but it is better to start with liquids, then soup and crackers, and gradually work up to solid foods.   3) Please notify your doctor immediately if you have any unusual bleeding, trouble breathing, redness and pain at the surgery site, drainage, fever, or pain not relieved by medication.    4) Additional Instructions:    Eye Surgery Discharge Instructions  Expect mild scratchy sensation or mild soreness. DO NOT RUB YOUR EYE!  The day of surgery:  Minimal physical activity, but bed rest is not required  No reading, computer work, or close hand work  No bending, lifting, or straining.  May watch TV  For 24 hours:  No driving, legal decisions, or alcoholic beverages  Safety precautions  Eat anything you prefer: It is better to start with liquids, then soup then solid foods.  _____ Eye patch should be worn until postoperative exam tomorrow.  ____ Solar shield eyeglasses should be worn for comfort in the sunlight/patch while sleeping  Resume all regular medications including aspirin or Coumadin if these were discontinued prior to surgery. You may shower, bathe, shave, or wash your hair. Tylenol may be taken for mild discomfort.  Call your doctor if you experience significant pain, nausea, or vomiting, fever > 101 or other signs of infection. [phone] or [phone] Specific instructions:  Follow-up Information    Follow up with PORFILIO,WILLIAM LOUIS, MD In 1 day.   Specialty:  Ophthalmology   Why:  August 10 at 9:35am   Contact information:   [address] [place] [address] [phone]         Please contact your physician with any problems or Same Day Surgery at [phone], Monday through Friday 6 am to 4 pm, or [place] at [facility] number at [phone].

## 2015-06-01 NOTE — Anesthesia Preprocedure Evaluation (Signed)
Anesthesia Evaluation  Patient identified by MRN, date of birth, ID band Patient awake    Reviewed: Allergy & Precautions, H&P , NPO status , Patient's Chart, lab work & pertinent test results, reviewed documented beta blocker date and time   History of Anesthesia Complications Negative for: history of anesthetic complications  Airway Mallampati: II  TM Distance: >3 FB Neck ROM: full    Dental no notable dental hx.    Pulmonary neg pulmonary ROS,  breath sounds clear to auscultation  Pulmonary exam normal       Cardiovascular Exercise Tolerance: Good hypertension, - angina- CAD, - Past MI, - Cardiac Stents and - CABG Normal cardiovascular exam+ dysrhythmias Atrial Fibrillation + Valvular Problems/Murmurs Rhythm:regular Rate:Normal     Neuro/Psych Seizures -, Well Controlled,  TIAnegative psych ROS   GI/Hepatic negative GI ROS, Neg liver ROS,   Endo/Other  negative endocrine ROS  Renal/GU negative Renal ROS  negative genitourinary   Musculoskeletal   Abdominal   Peds  Hematology negative hematology ROS (+)   Anesthesia Other Findings Past Medical History:   Chronic a-fib                                                DVT (deep venous thrombosis)                                   Comment:H/O   Hypertension                                                 Seizure                                                      Dysrhythmia                                                    Comment:a fib   Heart murmur                                                 Arthritis                                                    Edema                                                        Reproductive/Obstetrics negative OB ROS  Anesthesia Physical Anesthesia Plan  ASA: III  Anesthesia Plan: MAC   Post-op Pain Management:    Induction:   Airway Management Planned:    Additional Equipment:   Intra-op Plan:   Post-operative Plan:   Informed Consent: I have reviewed the patients History and Physical, chart, labs and discussed the procedure including the risks, benefits and alternatives for the proposed anesthesia with the patient or authorized representative who has indicated his/her understanding and acceptance.   Dental Advisory Given  Plan Discussed with: Anesthesiologist, CRNA and Surgeon  Anesthesia Plan Comments:         Anesthesia Quick Evaluation

## 2015-06-01 NOTE — Transfer of Care (Signed)
Immediate Anesthesia Transfer of Care Note  Patient: [name]  Procedure(s) Performed: Procedure(s) with comments: CATARACT EXTRACTION PHACO AND INTRAOCULAR LENS PLACEMENT (IOC) (Right) - [place]: 01:02.0 AP%: 23.1 CDE: 14.33 Fluid lot# [id number]   Patient Location: PACU  Anesthesia Type:MAC  Level of Consciousness: awake, alert  and oriented  Airway & Oxygen Therapy: Patient Spontanous Breathing  Post-op Assessment: Report given to RN and Post -op Vital signs reviewed and stable  Post vital signs: Reviewed and stable  Last Vitals:  Filed Vitals:   06/01/15 0954  BP: 130/71  Pulse: 74  Temp: 36.6 C  Resp: 18    Complications: No apparent anesthesia complications

## 2015-06-01 NOTE — Op Note (Signed)
PREOPERATIVE DIAGNOSIS:  Nuclear sclerotic cataract of the right eye.   POSTOPERATIVE DIAGNOSIS: right nuclear sclerotic cataract   OPERATIVE PROCEDURE:  Procedure(s): CATARACT EXTRACTION PHACO AND INTRAOCULAR LENS PLACEMENT (IOC)   SURGEON:  Birder Robson, MD.   ANESTHESIA:  No anesthesia staff entered.  1.      Managed anesthesia care. 2.      Topical tetracaine drops followed by 2% Xylocaine jelly applied in the preoperative holding area.   COMPLICATIONS:  None.   TECHNIQUE:   Stop and chop   DESCRIPTION OF PROCEDURE:  The patient was examined and consented in the preoperative holding area where the aforementioned topical anesthesia was applied to the right eye and then brought back to the Operating Room where the right eye was prepped and draped in the usual sterile ophthalmic fashion and a lid speculum was placed. A paracentesis was created with the side port blade and the anterior chamber was filled with viscoelastic. A near clear corneal incision was performed with the steel keratome. A continuous curvilinear capsulorrhexis was performed with a cystotome followed by the capsulorrhexis forceps. Hydrodissection and hydrodelineation were carried out with BSS on a blunt cannula. The lens was removed in a stop and chop  technique and the remaining cortical material was removed with the irrigation-aspiration handpiece. The capsular bag was inflated with viscoelastic and the Technis ZCB00  lens was placed in the capsular bag without complication. The remaining viscoelastic was removed from the eye with the irrigation-aspiration handpiece. The wounds were hydrated. The anterior chamber was flushed with Miostat and the eye was inflated to physiologic pressure. 0.1 mL of cefuroxime concentration 10 mg/mL was placed in the anterior chamber. The wounds were found to be water tight. The eye was dressed with Vigamox. The patient was given protective glasses to wear throughout the day and a shield  with which to sleep tonight. The patient was also given drops with which to begin a drop regimen today and will follow-up with me in one day.  Implant Name Type Inv. Item Serial No. Manufacturer Lot No. LRB No. Used  LENS IMPL INTRAOC ZCB00 16.0 - [id number] Intraocular Lens LENS IMPL INTRAOC ZCB00 16.0 [id number]   Right 1   Procedure(s) with comments: CATARACT EXTRACTION PHACO AND INTRAOCULAR LENS PLACEMENT (IOC) (Right) - [place]: 01:02.0 AP%: 23.1 CDE: 14.33 Fluid lot# [id number]   Electronically signed: Perry Hall 06/01/2015 11:36 AM

## 2015-06-01 NOTE — H&P (Signed)
  All labs reviewed. Abnormal studies sent to patients PCP when indicated.  Previous H&P reviewed, patient examined, there are NO CHANGES.  [name] [name] LOUIS8/9/201611:05 AM

## 2015-06-21 DIAGNOSIS — H2512 Age-related nuclear cataract, left eye: Secondary | ICD-10-CM | POA: Diagnosis not present

## 2015-06-21 DIAGNOSIS — M1990 Unspecified osteoarthritis, unspecified site: Secondary | ICD-10-CM | POA: Diagnosis not present

## 2015-06-21 DIAGNOSIS — R0602 Shortness of breath: Secondary | ICD-10-CM | POA: Diagnosis not present

## 2015-06-21 DIAGNOSIS — M810 Age-related osteoporosis without current pathological fracture: Secondary | ICD-10-CM | POA: Diagnosis not present

## 2015-06-21 DIAGNOSIS — M7989 Other specified soft tissue disorders: Secondary | ICD-10-CM | POA: Diagnosis not present

## 2015-06-21 DIAGNOSIS — I10 Essential (primary) hypertension: Secondary | ICD-10-CM | POA: Diagnosis not present

## 2015-06-21 DIAGNOSIS — R011 Cardiac murmur, unspecified: Secondary | ICD-10-CM | POA: Diagnosis not present

## 2015-06-21 DIAGNOSIS — Z86718 Personal history of other venous thrombosis and embolism: Secondary | ICD-10-CM | POA: Diagnosis not present

## 2015-06-21 DIAGNOSIS — Z9841 Cataract extraction status, right eye: Secondary | ICD-10-CM | POA: Diagnosis not present

## 2015-06-21 DIAGNOSIS — I4891 Unspecified atrial fibrillation: Secondary | ICD-10-CM | POA: Diagnosis not present

## 2015-06-22 ENCOUNTER — Ambulatory Visit: Payer: PPO | Admitting: Anesthesiology

## 2015-06-22 ENCOUNTER — Ambulatory Visit
Admission: RE | Admit: 2015-06-22 | Discharge: 2015-06-22 | Disposition: A | Discharge status: Home / Self Care | Payer: PPO | Source: Ambulatory Visit | Attending: Ophthalmology | Admitting: Ophthalmology

## 2015-06-22 ENCOUNTER — Encounter: Payer: Self-pay | Admitting: *Deleted

## 2015-06-22 ENCOUNTER — Encounter
Admission: RE | Disposition: A | Discharge status: Home / Self Care | Payer: Self-pay | Source: Ambulatory Visit | Attending: Ophthalmology

## 2015-06-22 DIAGNOSIS — M810 Age-related osteoporosis without current pathological fracture: Secondary | ICD-10-CM | POA: Insufficient documentation

## 2015-06-22 DIAGNOSIS — Z9841 Cataract extraction status, right eye: Secondary | ICD-10-CM | POA: Insufficient documentation

## 2015-06-22 DIAGNOSIS — H2512 Age-related nuclear cataract, left eye: Secondary | ICD-10-CM | POA: Insufficient documentation

## 2015-06-22 DIAGNOSIS — R0602 Shortness of breath: Secondary | ICD-10-CM | POA: Insufficient documentation

## 2015-06-22 DIAGNOSIS — I10 Essential (primary) hypertension: Secondary | ICD-10-CM | POA: Insufficient documentation

## 2015-06-22 DIAGNOSIS — I4891 Unspecified atrial fibrillation: Secondary | ICD-10-CM | POA: Insufficient documentation

## 2015-06-22 DIAGNOSIS — Z86718 Personal history of other venous thrombosis and embolism: Secondary | ICD-10-CM | POA: Insufficient documentation

## 2015-06-22 DIAGNOSIS — M1990 Unspecified osteoarthritis, unspecified site: Secondary | ICD-10-CM | POA: Insufficient documentation

## 2015-06-22 DIAGNOSIS — R011 Cardiac murmur, unspecified: Secondary | ICD-10-CM | POA: Insufficient documentation

## 2015-06-22 DIAGNOSIS — M7989 Other specified soft tissue disorders: Secondary | ICD-10-CM | POA: Insufficient documentation

## 2015-06-22 HISTORY — PX: CATARACT EXTRACTION W/PHACO: SHX586

## 2015-06-22 LAB — PROTIME-INR
INR: 1.19
Prothrombin Time: 15.3 seconds — ABNORMAL HIGH (ref 11.4–15.0)

## 2015-06-22 SURGERY — PHACOEMULSIFICATION, CATARACT, WITH IOL INSERTION
Anesthesia: Monitor Anesthesia Care | Site: Eye | Laterality: Left | Wound class: Clean

## 2015-06-22 MED ORDER — TETRACAINE HCL 0.5 % OP SOLN
OPHTHALMIC | Status: AC
  Administered 2015-06-22: 1 [drp] via OPHTHALMIC
  Filled 2015-06-22: qty 2

## 2015-06-22 MED ORDER — MIDAZOLAM HCL 2 MG/2ML IJ SOLN
INTRAMUSCULAR | Status: DC | PRN
  Administered 2015-06-22: 1 mg via INTRAVENOUS

## 2015-06-22 MED ORDER — CEFUROXIME OPHTHALMIC INJECTION 1 MG/0.1 ML
INJECTION | OPHTHALMIC | Status: AC
  Filled 2015-06-22: qty 0.1

## 2015-06-22 MED ORDER — NA CHONDROIT SULF-NA HYALURON 40-17 MG/ML IO SOLN
INTRAOCULAR | Status: DC | PRN
  Administered 2015-06-22: 1 mL via INTRAOCULAR

## 2015-06-22 MED ORDER — POVIDONE-IODINE 5 % OP SOLN
1.0000 "application " | Freq: Once | OPHTHALMIC | Status: AC
  Administered 2015-06-22: 1 via OPHTHALMIC

## 2015-06-22 MED ORDER — CEFUROXIME OPHTHALMIC INJECTION 1 MG/0.1 ML
INJECTION | OPHTHALMIC | Status: DC | PRN
  Administered 2015-06-22: 0.1 mL via INTRACAMERAL

## 2015-06-22 MED ORDER — ARMC OPHTHALMIC DILATING GEL
OPHTHALMIC | Status: AC
  Administered 2015-06-22: 1 via OPHTHALMIC
  Filled 2015-06-22: qty 0.25

## 2015-06-22 MED ORDER — FENTANYL CITRATE (PF) 100 MCG/2ML IJ SOLN: 25.0000 ug | INTRAMUSCULAR | Status: DC | PRN

## 2015-06-22 MED ORDER — ONDANSETRON HCL 4 MG/2ML IJ SOLN: 4.0000 mg | Freq: Once | INTRAMUSCULAR | Status: DC | PRN

## 2015-06-22 MED ORDER — EPINEPHRINE HCL 1 MG/ML IJ SOLN
INTRAMUSCULAR | Status: AC
  Filled 2015-06-22: qty 1

## 2015-06-22 MED ORDER — MOXIFLOXACIN HCL 0.5 % OP SOLN
OPHTHALMIC | Status: DC | PRN
  Administered 2015-06-22: 1 [drp] via OPHTHALMIC

## 2015-06-22 MED ORDER — MOXIFLOXACIN HCL 0.5 % OP SOLN
OPHTHALMIC | Status: AC
  Filled 2015-06-22: qty 3

## 2015-06-22 MED ORDER — ARMC OPHTHALMIC DILATING GEL
1.0000 | OPHTHALMIC | Status: DC | PRN
Start: 2015-06-22 — End: 2015-06-22
  Administered 2015-06-22: 1 via OPHTHALMIC

## 2015-06-22 MED ORDER — SODIUM CHLORIDE 0.9 % IV SOLN
INTRAVENOUS | Status: DC
  Administered 2015-06-22: 11:00:00 via INTRAVENOUS

## 2015-06-22 MED ORDER — TETRACAINE HCL 0.5 % OP SOLN
1.0000 [drp] | Freq: Once | OPHTHALMIC | Status: AC
  Administered 2015-06-22: 1 [drp] via OPHTHALMIC

## 2015-06-22 MED ORDER — NA CHONDROIT SULF-NA HYALURON 40-17 MG/ML IO SOLN
INTRAOCULAR | Status: AC
  Filled 2015-06-22: qty 1

## 2015-06-22 MED ORDER — BSS IO SOLN
INTRAOCULAR | Status: DC | PRN
  Administered 2015-06-22: 200 mL via OPHTHALMIC

## 2015-06-22 MED ORDER — MOXIFLOXACIN HCL 0.5 % OP SOLN: 1.0000 [drp] | OPHTHALMIC | Status: DC | PRN

## 2015-06-22 MED ORDER — FENTANYL CITRATE (PF) 100 MCG/2ML IJ SOLN
INTRAMUSCULAR | Status: DC | PRN
  Administered 2015-06-22: 50 ug via INTRAVENOUS

## 2015-06-22 MED ORDER — POVIDONE-IODINE 5 % OP SOLN
OPHTHALMIC | Status: AC
  Administered 2015-06-22: 1 via OPHTHALMIC
  Filled 2015-06-22: qty 30

## 2015-06-22 SURGICAL SUPPLY — 22 items
CANNULA ANT/CHMB 27GA (MISCELLANEOUS) ×2 IMPLANT
CUP MEDICINE 2OZ PLAST GRAD ST (MISCELLANEOUS) ×2 IMPLANT
GLOVE BIO SURGEON STRL SZ8 (GLOVE) ×2 IMPLANT
GLOVE BIOGEL M 6.5 STRL (GLOVE) ×2 IMPLANT
GLOVE SURG LX 8.0 MICRO (GLOVE) ×1
GLOVE SURG LX STRL 8.0 MICRO (GLOVE) ×1 IMPLANT
GOWN STRL REUS W/ TWL LRG LVL3 (GOWN DISPOSABLE) ×2 IMPLANT
GOWN STRL REUS W/TWL LRG LVL3 (GOWN DISPOSABLE) ×2
LENS IOL TECNIS 20.5 (Intraocular Lens) ×2 IMPLANT
LENS IOL TECNIS MONO 1P 20.5 (Intraocular Lens) ×1 IMPLANT
PACK CATARACT (MISCELLANEOUS) ×2 IMPLANT
PACK CATARACT BRASINGTON LX (MISCELLANEOUS) ×2 IMPLANT
PACK EYE AFTER SURG (MISCELLANEOUS) ×2 IMPLANT
SOL BSS BAG (MISCELLANEOUS) ×2
SOL PREP PVP 2OZ (MISCELLANEOUS) ×2
SOLUTION BSS BAG (MISCELLANEOUS) ×1 IMPLANT
SOLUTION PREP PVP 2OZ (MISCELLANEOUS) ×1 IMPLANT
SYR 3ML LL SCALE MARK (SYRINGE) ×2 IMPLANT
SYR 5ML LL (SYRINGE) ×2 IMPLANT
SYR TB 1ML 27GX1/2 LL (SYRINGE) ×2 IMPLANT
WATER STERILE IRR 1000ML POUR (IV SOLUTION) ×2 IMPLANT
WIPE NON LINTING 3.25X3.25 (MISCELLANEOUS) ×2 IMPLANT

## 2015-06-22 NOTE — H&P (Signed)
  All labs reviewed. Abnormal studies sent to patients PCP when indicated.  Previous H&P reviewed, patient examined, there are NO CHANGES.  [name] [name] LOUIS8/30/201611:09 AM

## 2015-06-22 NOTE — Op Note (Signed)
PREOPERATIVE DIAGNOSIS:  Nuclear sclerotic cataract of the left eye.   POSTOPERATIVE DIAGNOSIS:  left nuclear sclerotic cataract   OPERATIVE PROCEDURE:  Procedure(s): CATARACT EXTRACTION PHACO AND INTRAOCULAR LENS PLACEMENT (IOC)   SURGEON:  Birder Robson, MD.   ANESTHESIA:   Anesthesiologist: Alvin Critchley, MD CRNA: Delaney Meigs, CRNA  1.      Managed anesthesia care. 2.      Topical tetracaine drops followed by 2% Xylocaine jelly applied in the preoperative holding area.   COMPLICATIONS:  None.   TECHNIQUE:   Stop and chop   DESCRIPTION OF PROCEDURE:  The patient was examined and consented in the preoperative holding area where the aforementioned topical anesthesia was applied to the left eye and then brought back to the Operating Room where the left eye was prepped and draped in the usual sterile ophthalmic fashion and a lid speculum was placed. A paracentesis was created with the side port blade and the anterior chamber was filled with viscoelastic. A near clear corneal incision was performed with the steel keratome. A continuous curvilinear capsulorrhexis was performed with a cystotome followed by the capsulorrhexis forceps. Hydrodissection and hydrodelineation were carried out with BSS on a blunt cannula. The lens was removed in a stop and chop  technique and the remaining cortical material was removed with the irrigation-aspiration handpiece. The capsular bag was inflated with viscoelastic and the Technis ZCB00 lens was placed in the capsular bag without complication. The remaining viscoelastic was removed from the eye with the irrigation-aspiration handpiece. The wounds were hydrated. The anterior chamber was flushed with Miostat and the eye was inflated to physiologic pressure. 0.1 mL of cefuroxime concentration 10 mg/mL was placed in the anterior chamber. The wounds were found to be water tight. The eye was dressed with Vigamox. The patient was given protective glasses to wear  throughout the day and a shield with which to sleep tonight. The patient was also given drops with which to begin a drop regimen today and will follow-up with me in one day.  Implant Name Type Inv. Item Serial No. Manufacturer Lot No. LRB No. Used  LENS IMPL INTRAOC ZCB00 20.5 - [id number] Intraocular Lens LENS IMPL INTRAOC ZCB00 20.5 [id number] AMO   Left 1   Procedure(s) with comments: CATARACT EXTRACTION PHACO AND INTRAOCULAR LENS PLACEMENT (IOC) (Left) - [place]: 01:00.7 AP%: 23.1 CDE: 14.01 Lot # [id number]  Electronically signed: [place] 06/22/2015 11:33 AM

## 2015-06-22 NOTE — Transfer of Care (Signed)
Immediate Anesthesia Transfer of Care Note  Patient: [name] [name]  Procedure(s) Performed: Procedure(s) with comments: CATARACT EXTRACTION PHACO AND INTRAOCULAR LENS PLACEMENT (IOC) (Left) - [place]: 01:00.7 AP%: 23.1 CDE: 14.01 Lot # [id number]  Patient Location: short stay  Anesthesia Type:MAC  Level of Consciousness: awake, alert  and oriented  Airway & Oxygen Therapy: Patient Spontanous Breathing and Patient connected to nasal cannula oxygen  Post-op Assessment: Report given to RN and Post -op Vital signs reviewed and stable  Post vital signs: Reviewed and stable  Last Vitals: 100% 74hr 97/79 Jorje Guild Vitals:   06/22/15 1028  BP: 132/67  Pulse: 69  Temp: 36.8 C  Resp: 16    Complications: No apparent anesthesia complications

## 2015-06-22 NOTE — Anesthesia Postprocedure Evaluation (Signed)
  Anesthesia Post-op Note  Patient: [name] [name]  Procedure(s) Performed: Procedure(s) with comments: CATARACT EXTRACTION PHACO AND INTRAOCULAR LENS PLACEMENT (IOC) (Left) - [place]: 01:00.7 AP%: 23.1 CDE: 14.01 Lot # [id number]  Anesthesia type:MAC  Patient location: short stay  Post pain: Pain level controlled  Post assessment: Post-op Vital signs reviewed, Patient's Cardiovascular Status Stable, Respiratory Function Stable, Patent Airway and No signs of Nausea or vomiting  Post vital signs: Reviewed and stable  Last Vitals:  Filed Vitals:   06/22/15 1028  BP: 132/67  Pulse: 69  Temp: 36.8 C  Resp: 16    Level of consciousness: awake, alert  and patient cooperative  Complications: No apparent anesthesia complications

## 2015-06-22 NOTE — Anesthesia Preprocedure Evaluation (Signed)
Anesthesia Evaluation  Patient identified by MRN, date of birth, ID band Patient awake    Reviewed: Allergy & Precautions, NPO status , Patient's Chart, lab work & pertinent test results, reviewed documented beta blocker date and time   Airway Mallampati: II       Dental  (+) Caps, Chipped   Pulmonary shortness of breath and with exertion,  breath sounds clear to auscultation  Pulmonary exam normal       Cardiovascular hypertension, Pt. on medications and Pt. on home beta blockers + dysrhythmias Atrial Fibrillation + Valvular Problems/Murmurs Rhythm:Irregular     Neuro/Psych Seizures -, Well Controlled,  TIAnegative psych ROS   GI/Hepatic negative GI ROS, Neg liver ROS,   Endo/Other  negative endocrine ROS  Renal/GU negative Renal ROS  negative genitourinary   Musculoskeletal  (+) Arthritis -, Osteoarthritis,    Abdominal Normal abdominal exam  (+)   Peds negative pediatric ROS (+)  Hematology   Anesthesia Other Findings   Reproductive/Obstetrics                             Anesthesia Physical Anesthesia Plan  ASA: III  Anesthesia Plan: MAC   Post-op Pain Management:    Induction: Intravenous  Airway Management Planned: Nasal Cannula  Additional Equipment:   Intra-op Plan:   Post-operative Plan:   Informed Consent: I have reviewed the patients History and Physical, chart, labs and discussed the procedure including the risks, benefits and alternatives for the proposed anesthesia with the patient or authorized representative who has indicated his/her understanding and acceptance.   Dental advisory given  Plan Discussed with: CRNA and Surgeon  Anesthesia Plan Comments:         Anesthesia Quick Evaluation

## 2015-06-22 NOTE — Discharge Instructions (Signed)
AMBULATORY SURGERY  DISCHARGE INSTRUCTIONS   1) The drugs that you were given will stay in your system until tomorrow so for the next 24 hours you should not:  A) Drive an automobile B) Make any legal decisions C) Drink any alcoholic beverage   2) You may resume regular meals tomorrow.  Today it is better to start with liquids and gradually work up to solid foods.  You may eat anything you prefer, but it is better to start with liquids, then soup and crackers, and gradually work up to solid foods.   3) Please notify your doctor immediately if you have any unusual bleeding, trouble breathing, redness and pain at the surgery site, drainage, fever, or pain not relieved by medication.    4) Additional Instructions:    Eye Surgery Discharge Instructions  Expect mild scratchy sensation or mild soreness. DO NOT RUB YOUR EYE!  The day of surgery:  Minimal physical activity, but bed rest is not required  No reading, computer work, or close hand work  No bending, lifting, or straining.  May watch TV  For 24 hours:  No driving, legal decisions, or alcoholic beverages  Safety precautions  Eat anything you prefer: It is better to start with liquids, then soup then solid foods.  _____ Eye patch should be worn until postoperative exam tomorrow.  ____ Solar shield eyeglasses should be worn for comfort in the sunlight/patch while sleeping  Resume all regular medications including aspirin or Coumadin if these were discontinued prior to surgery. You may shower, bathe, shave, or wash your hair. Tylenol may be taken for mild discomfort.  Call your doctor if you experience significant pain, nausea, or vomiting, fever > 101 or other signs of infection. [phone] or [phone] Specific instructions:  Follow-up Information    Follow up with PORFILIO,WILLIAM LOUIS, MD In 1 day.   Specialty:  Ophthalmology   Why:  August 31 at 9:10am   Contact information:   [address] [address] [phone]         Please contact your physician with any problems or Same Day Surgery at [phone], Monday through Friday 6 am to 4 pm, or [place] at [facility] number at [phone].

## 2015-06-29 ENCOUNTER — Telehealth: Payer: Self-pay | Admitting: Family Medicine

## 2015-06-29 NOTE — Telephone Encounter (Signed)
Pt contacted office for refill request on the following medications:  warfarin (COUMADIN) 5 MG tablet and

## 2015-07-15 ENCOUNTER — Ambulatory Visit (INDEPENDENT_AMBULATORY_CARE_PROVIDER_SITE_OTHER): Payer: PPO | Admitting: Family Medicine

## 2015-07-15 ENCOUNTER — Encounter: Payer: Self-pay | Admitting: Family Medicine

## 2015-07-15 VITALS — BP 134/76 | HR 90 | Temp 97.8°F | Resp 16 | Wt 166.0 lb

## 2015-07-15 DIAGNOSIS — S61259A Open bite of unspecified finger without damage to nail, initial encounter: Secondary | ICD-10-CM | POA: Diagnosis not present

## 2015-07-15 DIAGNOSIS — W540XXA Bitten by dog, initial encounter: Principal | ICD-10-CM

## 2015-07-15 MED ORDER — AMOXICILLIN-POT CLAVULANATE 875-125 MG PO TABS
1.0000 | ORAL_TABLET | Freq: Two times a day (BID) | ORAL | Status: DC
Start: 2015-07-15 — End: 2015-08-17

## 2015-07-15 NOTE — Patient Instructions (Signed)
Discussed use of warm salt water soaks twice daily.

## 2015-07-15 NOTE — Progress Notes (Signed)
Subjective:     Patient ID: [name] [name], female   DOB: 1941/09/28, 74 y.o.   MRN: [id number]  HPI  Chief Complaint  Patient presents with  . Animal Bite    Patient comes in office today with concerns of animal bite to left hand. Patient reports that jer dog had got a hold of a chicken bone and when she tried taking it out its mouth it bit down. Patient last reported Tdap 12/2010.  States this occurred in the last two days. Reports she washed with soap and water and applied peroxide.   Review of Systems  Constitutional: Negative for fever and chills.       Objective:   Physical Exam  Constitutional: She appears well-developed and well-nourished. No distress.  Skin:  Left fifth finger with two superficial lacerations on both anterior and posterior surfaces. Posterior fourth finger is erythematous and mildy tender without deformity. Erythema extends to dorsum of hand distally.       Assessment:    1. Dog bite of finger, initial encounter - amoxicillin-clavulanate (AUGMENTIN) 875-125 MG per tablet; Take 1 tablet by mouth 2 (two) times daily.  Dispense: 14 tablet; Refill: 0    Plan:    Discussed warm salt water soaks twice daily and application of bandaids. To return if not improving over the next 2-3 days.

## 2015-08-04 ENCOUNTER — Emergency Department: Payer: PPO

## 2015-08-04 ENCOUNTER — Ambulatory Visit: Payer: PPO | Admitting: Family Medicine

## 2015-08-04 ENCOUNTER — Emergency Department
Admission: EM | Admit: 2015-08-04 | Discharge: 2015-08-04 | Disposition: A | Discharge status: Home / Self Care | Payer: PPO | Attending: Emergency Medicine | Admitting: Emergency Medicine

## 2015-08-04 DIAGNOSIS — Y9389 Activity, other specified: Secondary | ICD-10-CM | POA: Diagnosis not present

## 2015-08-04 DIAGNOSIS — S8991XA Unspecified injury of right lower leg, initial encounter: Secondary | ICD-10-CM | POA: Diagnosis not present

## 2015-08-04 DIAGNOSIS — Y998 Other external cause status: Secondary | ICD-10-CM | POA: Diagnosis not present

## 2015-08-04 DIAGNOSIS — W010XXA Fall on same level from slipping, tripping and stumbling without subsequent striking against object, initial encounter: Secondary | ICD-10-CM | POA: Diagnosis not present

## 2015-08-04 DIAGNOSIS — N3000 Acute cystitis without hematuria: Secondary | ICD-10-CM | POA: Insufficient documentation

## 2015-08-04 DIAGNOSIS — Z7901 Long term (current) use of anticoagulants: Secondary | ICD-10-CM | POA: Insufficient documentation

## 2015-08-04 DIAGNOSIS — Z792 Long term (current) use of antibiotics: Secondary | ICD-10-CM | POA: Diagnosis not present

## 2015-08-04 DIAGNOSIS — S79911A Unspecified injury of right hip, initial encounter: Secondary | ICD-10-CM | POA: Diagnosis not present

## 2015-08-04 DIAGNOSIS — I10 Essential (primary) hypertension: Secondary | ICD-10-CM | POA: Insufficient documentation

## 2015-08-04 DIAGNOSIS — M7918 Myalgia, other site: Secondary | ICD-10-CM

## 2015-08-04 DIAGNOSIS — Y92002 Bathroom of unspecified non-institutional (private) residence single-family (private) house as the place of occurrence of the external cause: Secondary | ICD-10-CM | POA: Diagnosis not present

## 2015-08-04 DIAGNOSIS — M545 Low back pain: Secondary | ICD-10-CM | POA: Diagnosis present

## 2015-08-04 DIAGNOSIS — Z79899 Other long term (current) drug therapy: Secondary | ICD-10-CM | POA: Insufficient documentation

## 2015-08-04 DIAGNOSIS — M1990 Unspecified osteoarthritis, unspecified site: Secondary | ICD-10-CM | POA: Diagnosis not present

## 2015-08-04 LAB — URINALYSIS COMPLETE WITH MICROSCOPIC (ARMC ONLY)
Bacteria, UA: NONE SEEN
Bilirubin Urine: NEGATIVE
Glucose, UA: NEGATIVE mg/dL
Ketones, ur: NEGATIVE mg/dL
Nitrite: NEGATIVE
Protein, ur: NEGATIVE mg/dL
Specific Gravity, Urine: 1.029 (ref 1.005–1.030)
pH: 5 (ref 5.0–8.0)

## 2015-08-04 MED ORDER — DIAZEPAM 2 MG PO TABS: 2.0000 mg | ORAL_TABLET | Freq: Three times a day (TID) | ORAL | Status: DC | PRN

## 2015-08-04 MED ORDER — ACETAMINOPHEN 500 MG PO TABS
ORAL_TABLET | ORAL | Status: AC
  Filled 2015-08-04: qty 2

## 2015-08-04 MED ORDER — NITROFURANTOIN MONOHYD MACRO 100 MG PO CAPS: 100.0000 mg | ORAL_CAPSULE | Freq: Two times a day (BID) | ORAL | Status: AC

## 2015-08-04 MED ORDER — DIAZEPAM 2 MG PO TABS
2.0000 mg | ORAL_TABLET | Freq: Once | ORAL | Status: AC
  Administered 2015-08-04: 2 mg via ORAL
  Filled 2015-08-04: qty 1

## 2015-08-04 NOTE — ED Provider Notes (Signed)
[facility] Emergency Department Provider Note  ____________________________________________  Time seen: Approximately 3:02 PM  I have reviewed the triage vital signs and the nursing notes.   HISTORY  Chief Complaint Leg Pain    HPI [name] [name] is a 74 y.o. female who presents to the emergency department for evaluation of right lower back pain and right knee pain after slipping while getting out of the bathtub yesterday. She denies actually falling, but feels as though she may have pulled a muscle in her leg or knee. She states that she has had some lower back pain for a few months but has had negative exams and urinalysis at the primary care provider's office. She states that the slip has increased pain in the lower back. She has not had recent x-rays.   Past Medical History  Diagnosis Date  . Chronic a-fib ([place])   . DVT (deep venous thrombosis) ([place])     H/O  . Hypertension   . Seizure ([place])   . Dysrhythmia     a fib  . Heart murmur   . Arthritis   . Edema     Patient Active Problem List   Diagnosis Date Noted  . 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 Arthritis of knee, degenerative 05/26/2014  . TIA (transient ischemic attack) 02/16/2014  . Seizures ([place]) 02/16/2014  . Chronic leg pain 02/16/2014  . Fatigue 07/02/2013  . HYPERTENSION, BENIGN 12/07/2010  . ATRIAL FIBRILLATION 06/29/2009  . SHORTNESS OF BREATH 06/29/2009    Past Surgical History  Procedure Laterality Date  . Ablation  [place], [place]  . Tonsillectomy    . Bilateral wrist surgery  2003  . Colonoscopy    . Tonsillectomy    . Breast surgery    . Cataract extraction w/phaco Right 06/01/2015    Procedure: CATARACT EXTRACTION PHACO AND INTRAOCULAR LENS PLACEMENT (IOC);  Surgeon: Birder Robson, MD;  Location: [facility] ORS;  Service: Ophthalmology;  Laterality: Right;  [place]: 01:02.0 AP%: 23.1 CDE: 14.33 Fluid lot# [id number]   . Cataract  extraction w/phaco Left 06/22/2015    Procedure: CATARACT EXTRACTION PHACO AND INTRAOCULAR LENS PLACEMENT (IOC);  Surgeon: Birder Robson, MD;  Location: [facility] ORS;  Service: Ophthalmology;  Laterality: Left;  [place]: 01:00.7 AP%: 23.1 CDE: 14.01 Lot # [id number]    Current Outpatient Rx  Name  Route  Sig  Dispense  Refill  . amoxicillin-clavulanate (AUGMENTIN) 875-125 MG per tablet   Oral   Take 1 tablet by mouth 2 (two) times daily.   14 tablet   0   . B Complex-Biotin-FA (HM VITAMIN B100 COMPLEX PO)   Oral   Take 1 tablet by mouth daily.         . Biotin 5000 MCG CAPS   Oral   Take 1 capsule by mouth daily.         Marland Kitchen CALCIUM-VITAMIN D PO   Oral   Take 600 mg by mouth daily.          . Coenzyme Q10 (CO Q-10) 100 MG CAPS   Oral   Take 100 mg by mouth 1 day or 1 dose.           . Cyanocobalamin (VITAMIN B 12 PO)   Oral   Take by mouth daily.         . diazepam (VALIUM) 2 MG tablet   Oral   Take 1 tablet (2 mg total) by mouth every  8 (eight) hours as needed.   12 tablet   0   . diltiazem (CARDIZEM CD) 120 MG 24 hr capsule   Oral   Take 1 capsule (120 mg total) by mouth 2 (two) times daily. Patient taking differently: Take 120 mg by mouth daily.    180 capsule   3   . donepezil (ARICEPT) 10 MG tablet   Oral   Take 10 mg by mouth daily.         Marland Kitchen gabapentin (NEURONTIN) 100 MG capsule   Oral   Take 1 tablet by mouth daily.          Javier Docker Oil 300 MG CAPS   Oral   Take 500 mg by mouth daily.          Marland Kitchen lamoTRIgine (LAMICTAL) 100 MG tablet   Oral   Take 25 mg by mouth 2 (two) times daily.          . metoprolol (LOPRESSOR) 50 MG tablet   Oral   Take 1 tablet (50 mg total) by mouth 2 (two) times daily.   180 tablet   3   . nitrofurantoin, macrocrystal-monohydrate, (MACROBID) 100 MG capsule   Oral   Take 1 capsule (100 mg total) by mouth 2 (two) times daily.   14 capsule   0   . oxycodone (OXY-IR) 5 MG capsule   Oral   Take 5 mg by  mouth 4 (four) times daily as needed.         Marland Kitchen oxyCODONE-acetaminophen (ROXICET) 5-325 MG per tablet   Oral   Take 1-2 tablets by mouth every 6 (six) hours as needed.   20 tablet   0   . Probiotic Product (PROBIOTIC FORMULA) CAPS   Oral   Take by mouth.           . Turmeric 500 MG CAPS   Oral   Take 800 mg by mouth daily.          . vitamin C (ASCORBIC ACID) 500 MG tablet   Oral   Take 500 mg by mouth daily.         . Vitamin D, Cholecalciferol, 1000 UNITS TABS   Oral   Take 1,000 Units by mouth daily.         . vitamin E 400 UNIT capsule   Oral   Take 400 Units by mouth daily.           Marland Kitchen warfarin (COUMADIN) 5 MG tablet      Take 1 tablet by mouth once daily or as directed by physician Patient taking differently: Take 1 tablet by mouth once daily or as directed by physician. Take 7.5mg  on Wednesdays   90 tablet   4     Refill [id number]     Allergies Review of patient's allergies indicates no known allergies.  Family History  Problem Relation Age of Onset  . Family history unknown: Yes    Social History Social History  Substance Use Topics  . Smoking status: Never Smoker   . Smokeless tobacco: Never Used  . Alcohol Use: No     Comment: wine- occasionally    Review of Systems Constitutional: No recent illness. Eyes: No visual changes. ENT: No sore throat. Cardiovascular: Denies chest pain or palpitations. Respiratory: Denies shortness of breath. Gastrointestinal: No abdominal pain.  Genitourinary: Negative for dysuria. Musculoskeletal: Pain in right lower back/hip and knee. Skin: Negative for rash. Neurological: Negative for headaches, focal weakness or numbness. 10-point  ROS otherwise negative.  ____________________________________________   PHYSICAL EXAM:  VITAL SIGNS: ED Triage Vitals  Enc Vitals Group     BP 08/04/15 1358 107/55 mmHg     Pulse Rate 08/04/15 1358 68     Resp 08/04/15 1358 16     Temp 08/04/15 1358 98.2 F  (36.8 C)     Temp Source 08/04/15 1358 Oral     SpO2 08/04/15 1358 98 %     Weight 08/04/15 1358 160 lb (72.576 kg)     Height 08/04/15 1358 5\' 2"  (1.575 m)     Head Cir --      Peak Flow --      Pain Score 08/04/15 1359 9     Pain Loc --      Pain Edu? --      Excl. in [place]? --    Constitutional: Alert and oriented. Well appearing and in no acute distress. Eyes: Conjunctivae are normal. EOMI. Head: Atraumatic. Nose: No congestion/rhinnorhea. Neck: No stridor.  Respiratory: Normal respiratory effort.   Musculoskeletal: No pain to hip with external rotation. Generalized tenderness to the right knee anterior and posterior.  Neurologic:  Normal speech and language. No gross focal neurologic deficits are appreciated. Speech is normal. No gait instability. Skin:  Skin is warm, dry and intact. Atraumatic. Psychiatric: Mood and affect are normal. Speech and behavior are normal.  ____________________________________________   LABS (all labs ordered are listed, but only abnormal results are displayed)  Labs Reviewed  URINALYSIS COMPLETEWITH MICROSCOPIC ([place] ONLY) - Abnormal; Notable for the following:    Color, Urine AMBER (*)    APPearance HAZY (*)    Hgb urine dipstick 1+ (*)    Leukocytes, UA TRACE (*)    Squamous Epithelial / LPF 6-30 (*)    All other components within normal limits   ____________________________________________  RADIOLOGY  Right Knee, Hip, and Pelvis without acute bony abnormality. Degenerative joint disease is noted in the knee. Early osteoarthritis is present in the hip joints. ____________________________________________   PROCEDURES  Procedure(s) performed: None   ____________________________________________   INITIAL IMPRESSION / ASSESSMENT AND PLAN / ED COURSE  Pertinent labs & imaging results that were available during my care of the patient were reviewed by me and considered in my medical decision making (see chart for details).  Patient  was advised to follow up with orthopedics. She was advised to follow up with the primary care provider for recheck of the urine in about 10 days. She was advised to return to the ER for symptoms that change or worsen if unable to schedule an appointment. ____________________________________________   FINAL CLINICAL IMPRESSION(S) / ED DIAGNOSES  Final diagnoses:  Acute cystitis without hematuria  Musculoskeletal pain  Osteoarthritis, unspecified osteoarthritis type, unspecified site       Victorino Dike, FNP 08/04/15 1811  Carrie Mew, MD 08/05/15 2127

## 2015-08-04 NOTE — ED Notes (Signed)
Patient comes in complaining that yesterday she slipped getting out of bathtub and caught self before falling and this morning she woke with increased pain to right leg and right lower back.  Patient stated "I may have pulled something yesterday when I slipped".  Patient able to move about, but complains that she is sore and slower than normal.

## 2015-08-05 ENCOUNTER — Other Ambulatory Visit: Payer: Self-pay | Admitting: Family Medicine

## 2015-08-05 MED ORDER — OXYCODONE-ACETAMINOPHEN 5-325 MG PO TABS: 1.0000 | ORAL_TABLET | Freq: Four times a day (QID) | ORAL | Status: DC | PRN

## 2015-08-05 NOTE — Telephone Encounter (Signed)
Pt contacted office for refill request on the following medications:  oxyCODONE-acetaminophen (ROXICET) 5-325 MG.  CB#[phone]/MW

## 2015-08-12 ENCOUNTER — Telehealth: Payer: Self-pay | Admitting: Family Medicine

## 2015-08-12 NOTE — Telephone Encounter (Signed)
Pt contacted office for refill request on the following medications: metoprolol (LOPRESSOR) 50 MG tablet to [employer]. Pt would also like the mg changed from 50 to 25 mg since she is supposed to take a half tablet twice a day. Pt doesn't want to have to cut the pills in half every day like she has been. Pt stated that she is also having a lot of lower back pain and her legs are hurting. Pt stated this is keeping her up and wanted to know if she could get Flexeril or something like it to be sent to [facility]. Thanks TNP

## 2015-08-13 MED ORDER — METOPROLOL TARTRATE 25 MG PO TABS: 25.0000 mg | ORAL_TABLET | Freq: Two times a day (BID) | ORAL | Status: DC

## 2015-08-13 NOTE — Telephone Encounter (Signed)
Pt advised, appt made-aa

## 2015-08-13 NOTE — Telephone Encounter (Signed)
Left message to call back.  Thanks, 

## 2015-08-13 NOTE — Telephone Encounter (Signed)
Metoprolol order sent to [facility]. She will need to have back and leg pain evaluated n office before they can be treated. Also, I don't see that she has had PT done since August. If she is still taking warfarin then she needs to schedule a PT check.

## 2015-08-17 ENCOUNTER — Encounter: Payer: Self-pay | Admitting: Family Medicine

## 2015-08-17 ENCOUNTER — Ambulatory Visit (INDEPENDENT_AMBULATORY_CARE_PROVIDER_SITE_OTHER): Payer: PPO | Admitting: Family Medicine

## 2015-08-17 VITALS — BP 102/52 | HR 68 | Temp 97.5°F | Resp 16 | Wt 166.0 lb

## 2015-08-17 DIAGNOSIS — I4891 Unspecified atrial fibrillation: Secondary | ICD-10-CM

## 2015-08-17 DIAGNOSIS — M1712 Unilateral primary osteoarthritis, left knee: Secondary | ICD-10-CM | POA: Diagnosis not present

## 2015-08-17 DIAGNOSIS — M545 Low back pain, unspecified: Secondary | ICD-10-CM

## 2015-08-17 DIAGNOSIS — N390 Urinary tract infection, site not specified: Secondary | ICD-10-CM

## 2015-08-17 LAB — POCT URINALYSIS DIPSTICK
Bilirubin, UA: NEGATIVE
Glucose, UA: NEGATIVE
Ketones, UA: NEGATIVE
Leukocytes, UA: NEGATIVE
Nitrite, UA: NEGATIVE
Protein, UA: NEGATIVE
Spec Grav, UA: >=1.03
Urobilinogen, UA: 0.2
pH, UA: 6

## 2015-08-17 LAB — POCT INR
INR: 2.5
PT: 30.6

## 2015-08-17 MED ORDER — OXYCODONE-ACETAMINOPHEN 5-325 MG PO TABS: 1.0000 | ORAL_TABLET | Freq: Four times a day (QID) | ORAL | Status: DC | PRN

## 2015-08-17 NOTE — Progress Notes (Signed)
Patient: [name] [name] Female    DOB: 1941/02/07   74 y.o.   MRN: [id number] Visit Date: 08/17/2015  Today's Provider: Lelon Huh, MD   Chief Complaint  Patient presents with  . Follow-up  . Cystitis   Subjective:    HPI   Follow up ER visit  Patient was seen in ER for Cystitis on 08/04/2015. She was treated for Cystitis. Treatment for this included starting Cipro. She reports good compliance with treatment. She reports this condition is Improved.  ------------------------------------------------------------------------------------ Was seen in ED on 08/04/2015 for leg pain and back pain after slipping while getting out of the bath tube. She was prescribed diazepam for muscle spasm and nitrofurantoin for UTI. Only UTI symptom at the time was burning in her back which has resolved, but still having lower back pain. She states that diazepam made her very sleepy. Next day we prescrived oxycodone/apap which has helped, and only taken about 10 of them. However she states back and knee pain have been bothering her for months and only worsened when she slipped in tub. She had Synvisc injections and arthroplasty in left knee by Dr. Marry Guan many years ago, which she states helped quite a bit. She is wondering if it may be time for injections or surgeries  Dg Knee Complete 4 Views Right  08/04/2015  RIGHT KNEE - COMPLETE 4+ VIEW COMPARISON:  None. FINDINGS: There is no evidence of fracture, dislocation, or joint effusion. Moderate narrowing of patellofemoral space is noted with osteophyte formation. Moderate narrowing of medial joint space is noted with osteophyte formation. Soft tissues are unremarkable. IMPRESSION: Moderate degenerative joint disease is noted. No acute abnormality seen in the right knee. Electronically Signed   By: Marijo Conception, M.D.   On: 08/04/2015 15:41    Dg Hip Unilat With Pelvis 2-3 Views Right  08/04/2015  DG HIP (WITH OR WITHOUT PELVIS) 2-3V RIGHT  COMPARISON:  None. FINDINGS: Mild joint space narrowing and early spurring in the hip joints bilaterally. Degenerative changes in the visualized lower lumbar spine. SI joints are symmetric and unremarkable. No acute bony abnormality. Specifically, no fracture, subluxation, or dislocation. Soft tissues are intact. IMPRESSION: Early osteoarthritis in the hips bilaterally. No acute bony abnormality. Electronically Signed   By: Rolm Baptise M.D.   On: 08/04/2015 15:45         No Known Allergies Previous Medications   B COMPLEX-BIOTIN-FA (HM VITAMIN B100 COMPLEX PO)    Take 1 tablet by mouth daily.   BIOTIN 5000 MCG CAPS    Take 1 capsule by mouth daily.   CALCIUM-VITAMIN D PO    Take 600 mg by mouth daily.    COENZYME Q10 (CO Q-10) 100 MG CAPS    Take 100 mg by mouth 1 day or 1 dose.     CYANOCOBALAMIN (VITAMIN B 12 PO)    Take by mouth daily.   DIAZEPAM (VALIUM) 2 MG TABLET    Take 1 tablet (2 mg total) by mouth every 8 (eight) hours as needed.   DILTIAZEM (CARDIZEM CD) 120 MG 24 HR CAPSULE    Take 1 capsule (120 mg total) by mouth 2 (two) times daily.   DONEPEZIL (ARICEPT) 10 MG TABLET    Take 10 mg by mouth daily.   GABAPENTIN (NEURONTIN) 100 MG CAPSULE    Take 1 tablet by mouth daily.    KRILL OIL 300 MG CAPS    Take 500 mg by mouth daily.  LAMOTRIGINE (LAMICTAL) 100 MG TABLET    Take 25 mg by mouth 2 (two) times daily.    METOPROLOL (LOPRESSOR) 25 MG TABLET    Take 1 tablet (25 mg total) by mouth 2 (two) times daily.   OXYCODONE (OXY-IR) 5 MG CAPSULE    Take 5 mg by mouth 4 (four) times daily as needed.   OXYCODONE-ACETAMINOPHEN (ROXICET) 5-325 MG TABLET    Take 1-2 tablets by mouth every 6 (six) hours as needed.   PROBIOTIC PRODUCT (PROBIOTIC FORMULA) CAPS    Take by mouth.     TURMERIC 500 MG CAPS    Take 800 mg by mouth daily.    VITAMIN C (ASCORBIC ACID) 500 MG TABLET    Take 500 mg by mouth daily.   VITAMIN D, CHOLECALCIFEROL, 1000 UNITS TABS    Take 1,000 Units by mouth daily.    VITAMIN E 400 UNIT CAPSULE    Take 400 Units by mouth daily.     WARFARIN (COUMADIN) 5 MG TABLET    Take 1 tablet by mouth once daily or as directed by physician    Review of Systems  Constitutional: Negative for fever, chills, appetite change and fatigue.  Respiratory: Negative for chest tightness and shortness of breath.   Cardiovascular: Negative for chest pain and palpitations.  Gastrointestinal: Negative for nausea, vomiting and abdominal pain.  Genitourinary: Positive for urgency and frequency. Negative for dysuria, flank pain, vaginal bleeding, vaginal discharge and vaginal pain.  Musculoskeletal: Positive for back pain and arthralgias.  Neurological: Negative for dizziness and weakness.    Social History  Substance Use Topics  . Smoking status: Never Smoker   . Smokeless tobacco: Never Used  . Alcohol Use: 0.0 oz/week    0 Standard drinks or equivalent per week     Comment: wine- occasionally   Objective:   BP 102/52 mmHg  Pulse 68  Temp(Src) 97.5 F (36.4 C) (Oral)  Resp 16  Wt 166 lb (75.297 kg)  SpO2 97%  Physical Exam  General appearance: alert, well developed, well nourished, cooperative and in no distress Head: Normocephalic, without obvious abnormality, atraumatic Lungs: Respirations even and unlabored Extremities: No gross deformities Skin: Skin color, texture, turgor normal. No rashes seen  Psych: Appropriate mood and affect. Neurologic: Mental status: Alert, oriented to person, place, and time, thought content appropriate.  Results for orders placed or performed in visit on 08/17/15  POCT Urinalysis Dipstick  Result Value Ref Range   Color, UA yellow    Clarity, UA clear    Glucose, UA negative    Bilirubin, UA negative    Ketones, UA negative    Spec Grav, UA >=1.030    Blood, UA moderate (hem)    pH, UA 6.0    Protein, UA negative    Urobilinogen, UA 0.2    Nitrite, UA negative    Leukocytes, UA Negative Negative  POCT INR  Result Value Ref  Range   INR 2.5    PT 30.6        Assessment & Plan:     1. Bilateral low back pain without sciatica Recommend she see PT, but she states it is very expensive with her insurance. She will consider and call back if she changes her mind. Refilled #30 oxycodone/apap 5/325 today.   2. Primary osteoarthritis of left knee  - AMB referral to orthopedics  3. Urinary tract infection without hematuria, site unspecified Symptomatically resolved.   4. Atrial fibrillation Therapeutic INR, repeat in 1 month  Lelon Huh, MD  [facility]

## 2015-08-17 NOTE — Patient Instructions (Signed)
No change. Continue taking 5mg  daily. Recheck in 4 weeks (around  09/14/2015)

## 2015-08-18 ENCOUNTER — Encounter: Payer: Self-pay | Admitting: Cardiovascular Disease

## 2015-08-18 ENCOUNTER — Ambulatory Visit (INDEPENDENT_AMBULATORY_CARE_PROVIDER_SITE_OTHER): Payer: PPO | Admitting: Cardiovascular Disease

## 2015-08-18 VITALS — BP 108/54 | HR 61 | Ht 61.0 in | Wt 165.5 lb

## 2015-08-18 DIAGNOSIS — R5382 Chronic fatigue, unspecified: Secondary | ICD-10-CM | POA: Diagnosis not present

## 2015-08-18 DIAGNOSIS — I4891 Unspecified atrial fibrillation: Secondary | ICD-10-CM | POA: Diagnosis not present

## 2015-08-18 DIAGNOSIS — I10 Essential (primary) hypertension: Secondary | ICD-10-CM | POA: Diagnosis not present

## 2015-08-18 NOTE — Patient Instructions (Addendum)
You are doing well. No medication changes were made.  For fatigue, Decrease the metoprolol down to 1/2 pill twice a day If you have low blood pressure, fatigue, Ok to hold the metoprolol  Please call us if you have new issues that need to be addressed before your next appt.  Your physician wants you to follow-up in: 6 months.  You will receive a reminder letter in the mail two months in advance. If you don't receive a letter, please call our office to schedule the follow-up appointment.
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
## 2015-08-18 NOTE — Assessment & Plan Note (Signed)
Fatigue likely secondary to poor sleep hygiene. Also recommended she start a regular exercise program in addition to walking her dog Weight loss

## 2015-08-18 NOTE — Assessment & Plan Note (Signed)
Chronic atrial fibrillation, rate well controlled No changes to her medications

## 2015-08-18 NOTE — Assessment & Plan Note (Signed)
Blood pressure is well controlled on today's visit. No changes made to the medications. 

## 2015-08-30 ENCOUNTER — Other Ambulatory Visit: Payer: Self-pay | Admitting: Family Medicine

## 2015-08-30 NOTE — Telephone Encounter (Signed)
Pt wants to know if she can get the reg and nor the ER.diltiazem (CARDIZEM CD) 120 MG 24 hr capsule  She uses [employer] order.  Call back is [phone]  Thanks [name]

## 2015-09-01 NOTE — Telephone Encounter (Signed)
Pt is returning call.  CB#[phone]/MW

## 2015-09-02 NOTE — Telephone Encounter (Signed)
Patient is requesting call. CB# [phone]

## 2015-09-03 NOTE — Telephone Encounter (Signed)
No, the regular diltiazem increases rates of heart attack and death, and is only used short term for extremely high blood pressure that needs to be brought down right away.

## 2015-09-06 NOTE — Telephone Encounter (Signed)
Advised patient as directed. Patient is requesting 90 day supply of diltaizem to be sent into her mail order pharmacy ([facility]). Thanks!

## 2015-09-06 NOTE — Telephone Encounter (Signed)
Tried calling Patient. No answer. Left message to call back.  

## 2015-09-22 ENCOUNTER — Ambulatory Visit (INDEPENDENT_AMBULATORY_CARE_PROVIDER_SITE_OTHER): Payer: PPO

## 2015-09-22 DIAGNOSIS — I4891 Unspecified atrial fibrillation: Secondary | ICD-10-CM

## 2015-09-22 DIAGNOSIS — Z23 Encounter for immunization: Secondary | ICD-10-CM | POA: Diagnosis not present

## 2015-09-22 LAB — POCT INR
INR: 2
PT: 24.3

## 2015-09-22 MED ORDER — DILTIAZEM HCL ER 120 MG PO CP12: 120.0000 mg | ORAL_CAPSULE | Freq: Two times a day (BID) | ORAL | Status: DC

## 2015-09-22 NOTE — Patient Instructions (Signed)
No change. Continue 5mg  daily. Call if any questions or concerns. Return in 4 weeks.

## 2015-09-22 NOTE — Progress Notes (Signed)
Patient ID: [name] [name], female   DOB: [birth year], 74 y.o.   MRN: [id number] Anticoagulation Dose Instructions as of 09/22/2015      Dorene Grebe Tue Wed Thu Fri Sat   New Dose 5 mg 5 mg 5 mg 5 mg 5 mg 5 mg 5 mg    Description        No change. Continue 5mg  daily. Call if any questions or concerns. Return in 4 weeks.

## 2015-09-27 ENCOUNTER — Ambulatory Visit (INDEPENDENT_AMBULATORY_CARE_PROVIDER_SITE_OTHER): Payer: PPO | Admitting: Family Medicine

## 2015-09-27 ENCOUNTER — Encounter: Payer: Self-pay | Admitting: Family Medicine

## 2015-09-27 VITALS — BP 118/72 | HR 60 | Temp 97.9°F | Resp 16 | Wt 162.0 lb

## 2015-09-27 DIAGNOSIS — R531 Weakness: Secondary | ICD-10-CM

## 2015-09-27 NOTE — Progress Notes (Signed)
Patient ID: [name] [name], female   DOB: [birth year], 74 y.o.   MRN: [id number]       Patient: [name] [name] Female    DOB: Jul 14, 1941   74 y.o.   MRN: [id number] Visit Date: 09/27/2015  Today's Provider: Lelon Huh, MD   Chief Complaint  Patient presents with  . Extremity Weakness    X 2 days.    Subjective:    Extremity Weakness  This is a new problem. The current episode started in the past 7 days. There has been no history of extremity trauma. The problem has been gradually worsening. The patient is experiencing no pain. Associated symptoms include an inability to bear weight, a limited range of motion and stiffness. Pertinent negatives include no numbness or tingling.  Patient reports that 2 days ago she rapid onset of generalized weakness of arms and legs and had trouble getting off of her couch. She felt well all day prior to onset of weakness. She reports that her legs were so weak, that she could barely lift herself up. It lasted about 30 minutes before she got enough of her strength back to get herself up and around. She states she had some trouble formulating speech for a few hours as well. She had no chest pains, dyspnea, or palpiations.  She feels very fatigued today, but feels muscle strength is about back to normal.   Patient reports that today is the first day she has not had to use her walker but she is still very weak. She contacted Dr. Trena Platt office to see if may be related to recent reduction in dose of lamotrigine and was advised to see her PCP     No Known Allergies Previous Medications   B COMPLEX-BIOTIN-FA (HM VITAMIN B100 COMPLEX PO)    Take 1 tablet by mouth daily.   BIOTIN 5000 MCG CAPS    Take 1 capsule by mouth daily.   CALCIUM-VITAMIN D PO    Take 600 mg by mouth daily.    COENZYME Q10 (CO Q-10) 100 MG CAPS    Take 100 mg by mouth 1 day or 1 dose.     CYANOCOBALAMIN (VITAMIN B 12 PO)    Take by mouth daily.   DILTIAZEM (CARDIZEM CD) 120 MG 24  HR CAPSULE    Take 1 capsule (120 mg total) by mouth 2 (two) times daily.   DILTIAZEM (CARDIZEM SR) 120 MG 12 HR CAPSULE    Take 1 capsule (120 mg total) by mouth 2 (two) times daily.   DONEPEZIL (ARICEPT) 10 MG TABLET    Take 10 mg by mouth daily.   GABAPENTIN (NEURONTIN) 100 MG CAPSULE    Take 1 tablet by mouth daily.    KRILL OIL 300 MG CAPS    Take 500 mg by mouth daily.    LAMOTRIGINE (LAMICTAL) 100 MG TABLET    Take 25 mg by mouth 2 (two) times daily.    METOPROLOL (LOPRESSOR) 25 MG TABLET    Take 1 tablet (25 mg total) by mouth 2 (two) times daily.   OXYCODONE-ACETAMINOPHEN (ROXICET) 5-325 MG TABLET    Take 1-2 tablets by mouth every 6 (six) hours as needed.   PROBIOTIC PRODUCT (PROBIOTIC FORMULA) CAPS    Take by mouth.     TURMERIC 500 MG CAPS    Take 800 mg by mouth daily.    VITAMIN C (ASCORBIC ACID) 500 MG TABLET    Take 500 mg by mouth daily.   VITAMIN D,  CHOLECALCIFEROL, 1000 UNITS TABS    Take 1,000 Units by mouth daily.   VITAMIN E 400 UNIT CAPSULE    Take 400 Units by mouth daily.     WARFARIN (COUMADIN) 5 MG TABLET    Take 1 tablet by mouth once daily or as directed by physician    Review of Systems  Constitutional: Positive for activity change and fatigue.  Respiratory: Positive for cough and shortness of breath. Negative for apnea, choking, chest tightness, wheezing and stridor.   Cardiovascular: Positive for leg swelling. Negative for chest pain and palpitations.  Musculoskeletal: Positive for myalgias, gait problem, extremity weakness and stiffness.  Neurological: Positive for weakness. Negative for tingling and numbness.  Psychiatric/Behavioral: Positive for decreased concentration.    Social History  Substance Use Topics  . Smoking status: Never Smoker   . Smokeless tobacco: Never Used  . Alcohol Use: No     Comment: wine- occasionally   Objective:   BP 118/72 mmHg  Pulse 60  Temp(Src) 97.9 F (36.6 C)  Resp 16  Wt 162 lb (73.483 kg)  Physical  Exam   General Appearance:    Alert, cooperative, no distress  Eyes:    PERRL, conjunctiva/corneas clear, EOM's intact       Lungs:     Clear to auscultation bilaterally, respirations unlabored  Heart:    Irregularly irregular rate and rhythm, II/VI systolic murmur. No carotid bruits.   Neurologic:   Awake, alert, oriented x 3. No apparent focal neurological           defect. +5/5 UE and LE muscle strength. No somatosensory deficits. Normal speech. CN II/XII grossly intact. DTR +3 UE and LEs. (this is typical for her by patient report)          Assessment & Plan:     1. Weakness Rapid onset, mostly resolve, but still some residual fatigue. She is not aware of any altered consciousness so I doubt this is related to seizures. TIA is a possibility. Possible she had some type of cardiac event. She is wondering about flu vaccine which she 3 days prior to episode, although this would be a very unusual event considering the time frame. Check labs as below. Consider carotids and neuroimaging if labs normal.  - CBC - Magnesium - Comprehensive metabolic panel - TSH - Troponin I       Lelon Huh, MD  [facility]

## 2015-09-28 LAB — CBC
Hematocrit: 45.6 % (ref 34.0–46.6)
Hemoglobin: 15.3 g/dL (ref 11.1–15.9)
MCH: 31.9 pg (ref 26.6–33.0)
MCHC: 33.6 g/dL (ref 31.5–35.7)
MCV: 95 fL (ref 79–97)
Platelets: 189 10*3/uL (ref 150–379)
RBC: 4.8 x10E6/uL (ref 3.77–5.28)
RDW: 13.9 % (ref 12.3–15.4)
WBC: 9.9 10*3/uL (ref 3.4–10.8)

## 2015-09-28 LAB — COMPREHENSIVE METABOLIC PANEL
ALT: 21 IU/L (ref 0–32)
AST: 23 IU/L (ref 0–40)
Albumin/Globulin Ratio: 2.2 (ref 1.1–2.5)
Albumin: 4.6 g/dL (ref 3.5–4.8)
Alkaline Phosphatase: 69 IU/L (ref 39–117)
BUN/Creatinine Ratio: 16 (ref 11–26)
BUN: 21 mg/dL (ref 8–27)
Bilirubin Total: 0.5 mg/dL (ref 0.0–1.2)
CO2: 23 mmol/L (ref 18–29)
Calcium: 10.2 mg/dL (ref 8.7–10.3)
Chloride: 98 mmol/L (ref 97–106)
Creatinine, Ser: 1.34 mg/dL — ABNORMAL HIGH (ref 0.57–1.00)
GFR calc Af Amer: 45 mL/min/{1.73_m2} — ABNORMAL LOW (ref >59)
GFR calc non Af Amer: 39 mL/min/{1.73_m2} — ABNORMAL LOW (ref >59)
Globulin, Total: 2.1 g/dL (ref 1.5–4.5)
Glucose: 76 mg/dL (ref 65–99)
Potassium: 4.1 mmol/L (ref 3.5–5.2)
Sodium: 139 mmol/L (ref 136–144)
Total Protein: 6.7 g/dL (ref 6.0–8.5)

## 2015-09-28 LAB — MAGNESIUM: Magnesium: 2.4 mg/dL — ABNORMAL HIGH (ref 1.6–2.3)

## 2015-09-28 LAB — TROPONIN I: Troponin I: <0.01 ng/mL (ref 0.00–0.04)

## 2015-09-28 LAB — TSH: TSH: 2.17 u[IU]/mL (ref 0.450–4.500)

## 2015-09-29 ENCOUNTER — Telehealth: Payer: Self-pay | Admitting: Family Medicine

## 2015-09-29 DIAGNOSIS — R531 Weakness: Secondary | ICD-10-CM

## 2015-09-29 NOTE — Telephone Encounter (Signed)
Patient notified of results. Patient expressed understanding. MRI and [place] ordered.

## 2015-09-29 NOTE — Telephone Encounter (Signed)
-----   Message from Birdie Sons, MD sent at 09/28/2015  7:47 AM EST ----- Labs are normal. Need to order MRI of brain can carotid doppler ultrasound for generalized weakness. Rule out CVA/TIA  Also please have [facility] add Total CK to labs that were drawn.

## 2015-09-29 NOTE — Telephone Encounter (Signed)
Pt is calling for lab results.  CB#[phone]/MW

## 2015-09-29 NOTE — Telephone Encounter (Signed)
Please schedule MRI and [place]. Thanks

## 2015-09-30 LAB — CK: Total CK: 95 U/L (ref 24–173)

## 2015-09-30 LAB — SPECIMEN STATUS REPORT

## 2015-10-06 ENCOUNTER — Ambulatory Visit
Admission: RE | Admit: 2015-10-06 | Discharge: 2015-10-06 | Disposition: A | Discharge status: Home / Self Care | Payer: PPO | Source: Ambulatory Visit | Attending: Family Medicine | Admitting: Family Medicine

## 2015-10-06 DIAGNOSIS — R531 Weakness: Secondary | ICD-10-CM

## 2015-10-06 DIAGNOSIS — I6523 Occlusion and stenosis of bilateral carotid arteries: Secondary | ICD-10-CM | POA: Diagnosis not present

## 2015-10-07 ENCOUNTER — Telehealth: Payer: Self-pay

## 2015-10-07 NOTE — Telephone Encounter (Signed)
Patient called wanting to know results of Ultrasound she had done yesterday. Results are in patient chart waiting to be reviewed. Call patient at [phone] with results.

## 2015-10-08 ENCOUNTER — Telehealth: Payer: Self-pay | Admitting: Family Medicine

## 2015-10-08 NOTE — Telephone Encounter (Signed)
Pt called for her results from her carotid Doppler that she had done earlier this week..  Please call her back at [phone]  Thanks teri

## 2015-10-08 NOTE — Telephone Encounter (Signed)
See result note. Patient is scheduled for MRI

## 2015-10-08 NOTE — Telephone Encounter (Signed)
Patient was notified of results. Patient expressed understanding. 

## 2015-10-21 ENCOUNTER — Ambulatory Visit: Payer: PPO

## 2015-10-27 ENCOUNTER — Other Ambulatory Visit: Payer: Self-pay | Admitting: Family Medicine

## 2015-10-27 MED ORDER — OXYCODONE-ACETAMINOPHEN 5-325 MG PO TABS: 1.0000 | ORAL_TABLET | Freq: Four times a day (QID) | ORAL | Status: DC | PRN

## 2015-10-27 NOTE — Telephone Encounter (Signed)
Pt contacted office for refill request on the following medications:  oxyCODONE-acetaminophen (ROXICET) 5-325 MG tablet.  CB#[phone]/MW

## 2015-10-28 ENCOUNTER — Ambulatory Visit
Admission: RE | Admit: 2015-10-28 | Discharge: 2015-10-28 | Disposition: A | Discharge status: Home / Self Care | Payer: PPO | Source: Ambulatory Visit | Attending: Family Medicine | Admitting: Family Medicine

## 2015-10-28 DIAGNOSIS — R531 Weakness: Secondary | ICD-10-CM | POA: Insufficient documentation

## 2015-10-28 DIAGNOSIS — I638 Other cerebral infarction: Secondary | ICD-10-CM | POA: Insufficient documentation

## 2015-10-28 DIAGNOSIS — G319 Degenerative disease of nervous system, unspecified: Secondary | ICD-10-CM | POA: Insufficient documentation

## 2015-10-28 MED ORDER — GADOBENATE DIMEGLUMINE 529 MG/ML IV SOLN
10.0000 mL | Freq: Once | INTRAVENOUS | Status: AC | PRN
  Administered 2015-10-28: 7 mL via INTRAVENOUS

## 2015-10-29 ENCOUNTER — Encounter: Payer: Self-pay | Admitting: Family Medicine

## 2015-10-29 ENCOUNTER — Ambulatory Visit (INDEPENDENT_AMBULATORY_CARE_PROVIDER_SITE_OTHER): Payer: PPO

## 2015-10-29 DIAGNOSIS — I639 Cerebral infarction, unspecified: Secondary | ICD-10-CM | POA: Diagnosis not present

## 2015-10-29 DIAGNOSIS — I482 Chronic atrial fibrillation, unspecified: Secondary | ICD-10-CM

## 2015-10-29 LAB — POCT INR
INR: 3.1
PT: 36.9

## 2015-10-29 NOTE — Addendum Note (Signed)
Addended by: Birdie Sons on: 10/29/2015 01:36 PM   Modules accepted: Orders

## 2015-10-29 NOTE — Patient Instructions (Signed)
Patient is to take Coumadin 5mg  daily and recheck in 3 weeks. Patient verbalized understanding and agrees to treatment plan.

## 2015-10-29 NOTE — Progress Notes (Addendum)
Anticoagulation Dose Instructions as of 10/29/2015      Dorene Grebe Tue Wed Thu Fri Sat   New Dose 5 mg 5 mg 5 mg 5 mg 5 mg 5 mg 5 mg    Description        Take Coumadin 5mg  daily.       MRI done yesterday shows subacute left frontal CVA. Patient states she has been having weakness in her left foot which often drags when she walks and is unable to use her left thumb. Will arrange early follow up with Dr. Manuella Ghazi for follow up new CVA and OT of left hand therapy.

## 2015-11-03 ENCOUNTER — Telehealth: Payer: Self-pay | Admitting: Family Medicine

## 2015-11-03 DIAGNOSIS — R531 Weakness: Secondary | ICD-10-CM

## 2015-11-03 DIAGNOSIS — Z8673 Personal history of transient ischemic attack (TIA), and cerebral infarction without residual deficits: Secondary | ICD-10-CM

## 2015-11-03 DIAGNOSIS — M1712 Unilateral primary osteoarthritis, left knee: Secondary | ICD-10-CM

## 2015-11-03 NOTE — Telephone Encounter (Signed)
OK for PT referral. Order in EMR

## 2015-11-03 NOTE — Telephone Encounter (Signed)
Pt would like an order sent to [facility] for physical therapy.She states that she has a lot of back and knee pain.She also has issues with gait instability

## 2015-11-08 ENCOUNTER — Encounter: Payer: Self-pay | Admitting: Physical Therapy

## 2015-11-08 ENCOUNTER — Encounter: Payer: PPO | Admitting: Occupational Therapy

## 2015-11-08 ENCOUNTER — Ambulatory Visit: Payer: PPO | Attending: Family Medicine | Admitting: Physical Therapy

## 2015-11-08 DIAGNOSIS — R531 Weakness: Secondary | ICD-10-CM | POA: Diagnosis not present

## 2015-11-08 DIAGNOSIS — R269 Unspecified abnormalities of gait and mobility: Secondary | ICD-10-CM | POA: Insufficient documentation

## 2015-11-09 DIAGNOSIS — R531 Weakness: Secondary | ICD-10-CM | POA: Diagnosis not present

## 2015-11-09 NOTE — Therapy (Signed)
[place] MAIN [facility] SERVICES [address] [place], [place], [address] Phone: [phone]   Fax:  [phone]  Physical Therapy Evaluation  Patient Details  Name: [name] [name] MRN: [id number] Date of Birth: [birth year] Referring Provider: Kirstie Peri. Fisher MD  Encounter Date: 11/08/2015      PT End of Session - 11/09/15 1057    Visit Number 1   Number of Visits 17   Date for PT Re-Evaluation 01/04/16   Authorization Type Gcode 1   Authorization Time Period 10   PT Start Time 1332   PT Stop Time 1430   PT Time Calculation (min) 58 min   Equipment Utilized During Treatment Gait belt   Activity Tolerance Patient tolerated treatment well;Patient limited by pain   Behavior During Therapy WFL for tasks assessed/performed      Past Medical History  Diagnosis Date  . Chronic a-fib ([place])   . DVT (deep venous thrombosis) ([place])     H/O  . Dysrhythmia     a fib  . Heart murmur   . Edema   . Hypertension     controlled  . Seizure ([place])     2 years ago knocked unconscious; hospitalized and diagnosed with seizures; no spells in last year;   . Arthritis     osteo; in B knees;   . Stroke [facility])     1 month ago (possible stroke)    Past Surgical History  Procedure Laterality Date  . Ablation  [place], [place]  . Tonsillectomy    . Bilateral wrist surgery  2003  . Colonoscopy    . Tonsillectomy    . Breast surgery    . Cataract extraction w/phaco Right 06/01/2015    Procedure: CATARACT EXTRACTION PHACO AND INTRAOCULAR LENS PLACEMENT (IOC);  Surgeon: Birder Robson, MD;  Location: [facility] ORS;  Service: Ophthalmology;  Laterality: Right;  [place]: 01:02.0 AP%: 23.1 CDE: 14.33 Fluid lot# [id number]   . Cataract extraction w/phaco Left 06/22/2015    Procedure: CATARACT EXTRACTION PHACO AND INTRAOCULAR LENS PLACEMENT (IOC);  Surgeon: Birder Robson, MD;  Location: [facility] ORS;  Service: Ophthalmology;  Laterality: Left;  [place]:  01:00.7 AP%: 23.1 CDE: 14.01 Lot # [id number]    There were no vitals filed for this visit.  Visit Diagnosis:  Weakness - Plan: PT plan of care cert/re-cert  Abnormality of gait - Plan: PT plan of care cert/re-cert      Subjective Assessment - 11/08/15 1339    Subjective 75 yo Female s/p CVA and B knee pain; Patient reports using a 3 wheeled walker for last few months due to increased back pain after walking dogs; Patient reports 1 month ago she came home after a concert and she fell in the floor off the couch. She was then able to get up and was okay. She reports "I have been deteriorating in my balance and having increased difficulty walking without knee/back pain"; Patient is s/p LLE knee arthroscope in 2007 and otherwise denies any history of knee surgery; She denies any numbness/tingling;    Pertinent History personal factors affecting rehab: fall risk, OA (progressive knee pain);    How long can you stand comfortably? 5-10 min   How long can you walk comfortably? walks 1 mile 2x a day with 3 wheeled walker   Diagnostic tests X-ray of R knee a few months ago which was unremarkable except of arthritis; has had MRI of brain and carotid doppler; Status post right  frontal lobe CVA 09/2015   Patient Stated Goals " Be able to walk without a walker and without severe pain"    Currently in Pain? Yes   Pain Score 4    Pain Location Back   Pain Orientation Right;Lower   Pain Descriptors / Indicators Stabbing   Pain Type Chronic pain   Pain Onset More than a month ago  4 months   Pain Frequency Intermittent   Pain Relieving Factors take pain medicine   Effect of Pain on Daily Activities decreased tolerance with walking;             [facility] PT Assessment - 11/09/15 0001    Assessment   Medical Diagnosis s/p CVA with B knee pain;    Onset Date/Surgical Date 07/24/15   Hand Dominance Right   Next MD Visit beginning of February   Prior Therapy has had PT following knee surgery; reports  good results; no PT for this current condition;    Precautions   Precautions Fall   Restrictions   Weight Bearing Restrictions No   Home Environment   Additional Comments lives in retirement community in an apartment; no stairs to enter house;    Prior Function   Level of Independence Independent with gait;Independent with transfers;Independent with basic ADLs  only started using walking about 3 months ago   Vocation Retired   Leisure likes to walk Automotive engineer   Overall Cognitive Status Within Functional Limits for tasks assessed   Sensation   Light Touch Appears Intact   Additional Comments denies any numbness/tingling   Coordination   Gross Motor Movements are Fluid and Coordinated Yes   Posture/Postural Control   Posture Comments has scoliosis "S" curve throughout thoracic/lumbar spine (right hip lower than left); increased prominence of right thoracolumbar muscles;    AROM   Overall AROM Comments BUE and BLE AROM is WFL; lumbar AROM functional flexion, decreased lumbar extension;    Strength   Overall Strength Comments BUE gross strength is 4/5; BLE: hip flexion 3+/5, abduction 4/5, adduction 4/5, knee flexion: 4+/5, extension: 3+/5 R, 4/5 L; ankle 4/5;    Palpation   Palpation comment denies any tenderness to palpation;   Ambulation/Gait   Gait Comments ambulates modified independent with 3 wheeled walker; demonstrates increased foot drag, reciprocal gait pattern, slower gait speed, increased forward trunk flexion;    6 Minute Walk- Baseline   BP (mmHg) 124/59 mmHg   HR (bpm) 76   02 Sat (%RA) 98 %   6 Minute walk- Post Test   BP (mmHg) 115/60 mmHg   HR (bpm) 75   02 Sat (%RA) 98 %   6 minute walk test results    Aerobic Endurance Distance Walked 785   Endurance additional comments with 3 wheeled walker with frequent foot drag and increased forward flexed posture;    Standardized Balance Assessment   10 Meter Walk 0.74 m/s with 3 wheeled walker; (limited community  ambulator, increased risk for falls;    High Level Balance   High Level Balance Comments able to hold SLS for approximately 3 sec each LE; tandem stance for 5 sec each LE without rail assist;                            PT Education - 11/09/15 1057    Education provided Yes   Education Details findings, recommendations   Person(s) Educated Patient   Methods Explanation  Comprehension Verbalized understanding             PT Long Term Goals - 02-Dec-2015 1108    PT LONG TERM GOAL #1   Title Patient will be independent in home exercise program to improve strength/mobility for better functional independence with ADLs by 01/04/16   Time 8   Period Weeks   Status New   PT LONG TERM GOAL #2   Title Patient (> 42 years old) will complete five times sit to stand test in < 15 seconds indicating an increased LE strength and improved balance by 01/04/16   Time 8   Period Weeks   Status New   PT LONG TERM GOAL #3   Title Patient will increase six minute walk test distance to >1000 for progression to community ambulator and improve gait ability by 01/04/16   Time 8   Period Weeks   Status New   PT LONG TERM GOAL #4   Title Patient will increase 10 meter walk test to >1.2m/s as to improve gait speed for better community ambulation and to reduce fall risk by 01/04/16   Time 8   Period Weeks   Status New   PT LONG TERM GOAL #5   Title Patient will tolerate 5 seconds of single leg stance without loss of balance to improve ability to get in and out of shower safely by 01/04/16   Time 8   Period Weeks   Status New               Plan - 12-02-2015 1058    Clinical Impression Statement 75 yo Female reports increased B knee pain due to OA and has impaired balance. She is s/p right fronta lobe CVA in Dec 2016. In addition, patient has chronic back pain which has been getting worse with walking. She walks her dog 2x a day and reports increased difficulty requiring AD over last  few months. Patient has scoliosis which she reports she has had since birth. She tested as an increased risk for falls and also demonstrates decreased gait endurance with decreased distance on 6 min walk test. Patient would benefit from additional skilled PT intervention to improve balance/gait safety and reduce knee/back pain;    Pt will benefit from skilled therapeutic intervention in order to improve on the following deficits Abnormal gait;Decreased endurance;Obesity;Cardiopulmonary status limiting activity;Decreased activity tolerance;Decreased strength;Decreased knowledge of use of DME;Pain;Difficulty walking;Decreased mobility;Decreased balance;Postural dysfunction;Impaired flexibility;Decreased safety awareness   Rehab Potential Fair   Clinical Impairments Affecting Rehab Potential positive: motivated, walks dogs 2x a day; negative: chronic condition, co-morbidities; Patient's clinical presentation is evolving as she has new back pain (approx 3 months) which increases with gait tasks, her OA is progressive in B knees;    PT Frequency 2x / week   PT Duration 8 weeks   PT Treatment/Interventions Aquatic Therapy;Cryotherapy;Moist Heat;Balance training;Therapeutic exercise;Therapeutic activities;Functional mobility training;Stair training;Gait training;DME Instruction;Neuromuscular re-education;Patient/family education;Energy conservation   PT Next Visit Plan initiate HEP- balance and LE strengthening   PT Home Exercise Plan will initiate next visit   Consulted and Agree with Plan of Care Patient          G-Codes - 2015/12/02 1111    Functional Assessment Tool Used 10 meter walk, 6 min walk, clinical judgement   Functional Limitation Mobility: Walking and moving around   Mobility: Walking and Moving Around Current Status [id number]) At least 40 percent but less than 60 percent impaired, limited or restricted   Mobility: Walking and Moving Around  Goal Status [phone]) At least 1 percent but less than 20  percent impaired, limited or restricted       Problem List Patient Active Problem List   Diagnosis Date Noted  . Weakness 09/27/2015  . 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 Arthritis of knee, degenerative 05/26/2014  . History of CVA (cerebrovascular accident) 02/16/2014  . Seizures ([place]) 02/16/2014  . Chronic leg pain 02/16/2014  . Fatigue 07/02/2013  . HYPERTENSION, BENIGN 12/07/2010  . ATRIAL FIBRILLATION 06/29/2009  . SHORTNESS OF BREATH 06/29/2009    Bobbe Quilter PT, DPT 11/09/2015, 11:12 AM  [place] MAIN [facility] SERVICES [address] [place], [place], [address] Phone: [phone]   Fax:  [phone]  Name: AMELIAMAE HECKEL MRN: [id number] Date of Birth: 1941/04/28

## 2015-11-11 DIAGNOSIS — I63511 Cerebral infarction due to unspecified occlusion or stenosis of right middle cerebral artery: Secondary | ICD-10-CM | POA: Diagnosis not present

## 2015-11-11 DIAGNOSIS — I69319 Unspecified symptoms and signs involving cognitive functions following cerebral infarction: Secondary | ICD-10-CM | POA: Diagnosis not present

## 2015-11-11 DIAGNOSIS — F0280 Dementia in other diseases classified elsewhere without behavioral disturbance: Secondary | ICD-10-CM | POA: Diagnosis not present

## 2015-11-11 DIAGNOSIS — G309 Alzheimer's disease, unspecified: Secondary | ICD-10-CM | POA: Diagnosis not present

## 2015-11-11 DIAGNOSIS — G40909 Epilepsy, unspecified, not intractable, without status epilepticus: Secondary | ICD-10-CM | POA: Diagnosis not present

## 2015-11-15 ENCOUNTER — Encounter: Payer: PPO | Admitting: Occupational Therapy

## 2015-11-16 ENCOUNTER — Encounter: Payer: Self-pay | Admitting: Physical Therapy

## 2015-11-16 ENCOUNTER — Ambulatory Visit: Payer: PPO | Admitting: Physical Therapy

## 2015-11-16 NOTE — Therapy (Signed)
[place] MAIN [facility] SERVICES [address] [place], [place], [address] Phone: [phone]   Fax:  [phone]  November 16, 2015   @[email or web address] @  Physical Therapy Discharge Summary  Patient: [name] [name]  MRN: [id number]  Date of Birth: August 21, 1941   Diagnosis: No diagnosis found. Referring Provider: Kirstie Peri. Fisher MD  The above patient had been seen in Physical Therapy 1 times of 2 treatments scheduled with 0 no shows and 1 cancellations.  The treatment consisted of evaluation; Patient called and cancelled all remaining appointments as she was going to go to [facility] PT because it was closer to her.  Please see evaluation for objective findings.   Sincerely,   Trotter,Margaret, PT, DPT   CC @[email or web address] @  [place] [address] [place], [place], [address] Phone: [phone]   Fax:  [phone]  Patient: [name] [name]  MRN: [id number]  Date of Birth: 1941-04-27

## 2015-11-17 ENCOUNTER — Encounter: Payer: PPO | Admitting: Occupational Therapy

## 2015-11-17 DIAGNOSIS — M545 Low back pain: Secondary | ICD-10-CM | POA: Diagnosis not present

## 2015-11-17 DIAGNOSIS — R262 Difficulty in walking, not elsewhere classified: Secondary | ICD-10-CM | POA: Diagnosis not present

## 2015-11-17 DIAGNOSIS — M25569 Pain in unspecified knee: Secondary | ICD-10-CM | POA: Diagnosis not present

## 2015-11-18 ENCOUNTER — Ambulatory Visit: Payer: PPO | Admitting: Physical Therapy

## 2015-11-22 ENCOUNTER — Encounter: Payer: PPO | Admitting: Occupational Therapy

## 2015-11-22 DIAGNOSIS — M545 Low back pain: Secondary | ICD-10-CM | POA: Diagnosis not present

## 2015-11-22 DIAGNOSIS — M25569 Pain in unspecified knee: Secondary | ICD-10-CM | POA: Diagnosis not present

## 2015-11-22 DIAGNOSIS — R262 Difficulty in walking, not elsewhere classified: Secondary | ICD-10-CM | POA: Diagnosis not present

## 2015-11-23 ENCOUNTER — Ambulatory Visit: Payer: PPO | Admitting: Physical Therapy

## 2015-11-24 ENCOUNTER — Encounter: Payer: PPO | Admitting: Occupational Therapy

## 2015-11-25 ENCOUNTER — Ambulatory Visit: Payer: PPO | Admitting: Physical Therapy

## 2015-11-25 DIAGNOSIS — M25569 Pain in unspecified knee: Secondary | ICD-10-CM | POA: Diagnosis not present

## 2015-11-25 DIAGNOSIS — R262 Difficulty in walking, not elsewhere classified: Secondary | ICD-10-CM | POA: Diagnosis not present

## 2015-11-25 DIAGNOSIS — M545 Low back pain: Secondary | ICD-10-CM | POA: Diagnosis not present

## 2015-11-26 ENCOUNTER — Telehealth: Payer: Self-pay | Admitting: Family Medicine

## 2015-11-26 ENCOUNTER — Ambulatory Visit (INDEPENDENT_AMBULATORY_CARE_PROVIDER_SITE_OTHER): Payer: PPO

## 2015-11-26 DIAGNOSIS — I482 Chronic atrial fibrillation, unspecified: Secondary | ICD-10-CM

## 2015-11-26 LAB — POCT INR
INR: 2
PT: 24.5

## 2015-11-26 NOTE — Patient Instructions (Signed)
Anticoagulation Dose Instructions as of 11/26/2015      [name] [name] Tue Wed Thu Fri Sat   New Dose 5 mg 5 mg 5 mg 5 mg 5 mg 5 mg 5 mg    Description        Take Coumadin 5mg  daily.

## 2015-11-26 NOTE — Telephone Encounter (Signed)
Please advise patient, due to recent stroke and after reviewing Dr. Janyth Pupa records, we want to keep her INR 2.5 to 3.5.  recommend she increase coumadin to 7.5 mg on Mondays and Fridays, and continue 5 mg on all other days. . Please move her from the coumadin clinic to my schedule on March 3rd for follow up. Thanks.

## 2015-11-26 NOTE — Telephone Encounter (Signed)
Patient was notified. Patient expressed understanding. Appointment was changed to follow-up.

## 2015-11-29 ENCOUNTER — Encounter: Payer: PPO | Admitting: Occupational Therapy

## 2015-11-30 ENCOUNTER — Ambulatory Visit: Payer: PPO | Admitting: Physical Therapy

## 2015-11-30 DIAGNOSIS — R262 Difficulty in walking, not elsewhere classified: Secondary | ICD-10-CM | POA: Diagnosis not present

## 2015-11-30 DIAGNOSIS — M545 Low back pain: Secondary | ICD-10-CM | POA: Diagnosis not present

## 2015-11-30 DIAGNOSIS — M25569 Pain in unspecified knee: Secondary | ICD-10-CM | POA: Diagnosis not present

## 2015-12-01 ENCOUNTER — Encounter: Payer: PPO | Admitting: Occupational Therapy

## 2015-12-02 ENCOUNTER — Ambulatory Visit: Payer: PPO | Admitting: Physical Therapy

## 2015-12-02 DIAGNOSIS — R262 Difficulty in walking, not elsewhere classified: Secondary | ICD-10-CM | POA: Diagnosis not present

## 2015-12-02 DIAGNOSIS — M25569 Pain in unspecified knee: Secondary | ICD-10-CM | POA: Diagnosis not present

## 2015-12-02 DIAGNOSIS — M545 Low back pain: Secondary | ICD-10-CM | POA: Diagnosis not present

## 2015-12-07 ENCOUNTER — Ambulatory Visit: Payer: PPO | Admitting: Physical Therapy

## 2015-12-07 DIAGNOSIS — M25569 Pain in unspecified knee: Secondary | ICD-10-CM | POA: Diagnosis not present

## 2015-12-07 DIAGNOSIS — R262 Difficulty in walking, not elsewhere classified: Secondary | ICD-10-CM | POA: Diagnosis not present

## 2015-12-07 DIAGNOSIS — M545 Low back pain: Secondary | ICD-10-CM | POA: Diagnosis not present

## 2015-12-09 ENCOUNTER — Ambulatory Visit: Payer: PPO | Admitting: Physical Therapy

## 2015-12-10 DIAGNOSIS — M545 Low back pain: Secondary | ICD-10-CM | POA: Diagnosis not present

## 2015-12-10 DIAGNOSIS — R262 Difficulty in walking, not elsewhere classified: Secondary | ICD-10-CM | POA: Diagnosis not present

## 2015-12-10 DIAGNOSIS — M25569 Pain in unspecified knee: Secondary | ICD-10-CM | POA: Diagnosis not present

## 2015-12-13 DIAGNOSIS — M545 Low back pain: Secondary | ICD-10-CM | POA: Diagnosis not present

## 2015-12-13 DIAGNOSIS — R262 Difficulty in walking, not elsewhere classified: Secondary | ICD-10-CM | POA: Diagnosis not present

## 2015-12-13 DIAGNOSIS — M25569 Pain in unspecified knee: Secondary | ICD-10-CM | POA: Diagnosis not present

## 2015-12-14 ENCOUNTER — Other Ambulatory Visit: Payer: Self-pay | Admitting: *Deleted

## 2015-12-14 ENCOUNTER — Ambulatory Visit: Payer: PPO | Admitting: Physical Therapy

## 2015-12-14 MED ORDER — GABAPENTIN 100 MG PO CAPS: 100.0000 mg | ORAL_CAPSULE | Freq: Two times a day (BID) | ORAL | Status: DC

## 2015-12-16 ENCOUNTER — Ambulatory Visit: Payer: PPO | Admitting: Physical Therapy

## 2015-12-16 DIAGNOSIS — R262 Difficulty in walking, not elsewhere classified: Secondary | ICD-10-CM | POA: Diagnosis not present

## 2015-12-16 DIAGNOSIS — M545 Low back pain: Secondary | ICD-10-CM | POA: Diagnosis not present

## 2015-12-16 DIAGNOSIS — M25569 Pain in unspecified knee: Secondary | ICD-10-CM | POA: Diagnosis not present

## 2015-12-21 ENCOUNTER — Ambulatory Visit: Payer: PPO | Admitting: Physical Therapy

## 2015-12-23 ENCOUNTER — Ambulatory Visit: Payer: PPO | Admitting: Physical Therapy

## 2015-12-24 ENCOUNTER — Ambulatory Visit: Payer: PPO

## 2015-12-24 ENCOUNTER — Ambulatory Visit: Payer: Self-pay | Admitting: Family Medicine

## 2015-12-24 DIAGNOSIS — M170 Bilateral primary osteoarthritis of knee: Secondary | ICD-10-CM | POA: Diagnosis not present

## 2015-12-24 DIAGNOSIS — M25562 Pain in left knee: Secondary | ICD-10-CM | POA: Diagnosis not present

## 2015-12-24 DIAGNOSIS — M25561 Pain in right knee: Secondary | ICD-10-CM | POA: Diagnosis not present

## 2015-12-28 ENCOUNTER — Ambulatory Visit: Payer: PPO | Admitting: Physical Therapy

## 2015-12-29 ENCOUNTER — Ambulatory Visit (INDEPENDENT_AMBULATORY_CARE_PROVIDER_SITE_OTHER): Payer: PPO | Admitting: Family Medicine

## 2015-12-29 ENCOUNTER — Encounter: Payer: Self-pay | Admitting: Family Medicine

## 2015-12-29 VITALS — BP 98/54 | HR 55 | Temp 98.3°F | Resp 16 | Ht 61.0 in | Wt 160.0 lb

## 2015-12-29 DIAGNOSIS — N3001 Acute cystitis with hematuria: Secondary | ICD-10-CM | POA: Diagnosis not present

## 2015-12-29 DIAGNOSIS — M79606 Pain in leg, unspecified: Secondary | ICD-10-CM | POA: Diagnosis not present

## 2015-12-29 DIAGNOSIS — Z8673 Personal history of transient ischemic attack (TIA), and cerebral infarction without residual deficits: Secondary | ICD-10-CM | POA: Diagnosis not present

## 2015-12-29 DIAGNOSIS — G8929 Other chronic pain: Secondary | ICD-10-CM

## 2015-12-29 DIAGNOSIS — I482 Chronic atrial fibrillation, unspecified: Secondary | ICD-10-CM

## 2015-12-29 DIAGNOSIS — R350 Frequency of micturition: Secondary | ICD-10-CM

## 2015-12-29 LAB — POCT URINALYSIS DIPSTICK
Bilirubin, UA: NEGATIVE
Glucose, UA: NEGATIVE
Leukocytes, UA: NEGATIVE
Nitrite, UA: NEGATIVE
Protein, UA: NEGATIVE
Spec Grav, UA: 1.02
Urobilinogen, UA: 0.2
pH, UA: 5

## 2015-12-29 LAB — POCT INR
INR: 3.7
INR: 44.7

## 2015-12-29 MED ORDER — CIPROFLOXACIN HCL 500 MG PO TABS: 500.0000 mg | ORAL_TABLET | Freq: Two times a day (BID) | ORAL | Status: AC

## 2015-12-29 MED ORDER — OXYCODONE-ACETAMINOPHEN 5-325 MG PO TABS: 1.0000 | ORAL_TABLET | Freq: Four times a day (QID) | ORAL | Status: DC | PRN

## 2015-12-29 NOTE — Patient Instructions (Signed)
Stop taking coumadin for the next two days. Then we will decide whether to change to Xarelto

## 2015-12-29 NOTE — Progress Notes (Signed)
Patient: [name] [name] Female    DOB: 1940-12-04   75 y.o.   MRN: [id number] Visit Date: 12/29/2015  Today's Provider: Lelon Huh, MD   Chief Complaint  Patient presents with  . Follow-up  . Cerebrovascular Accident   Subjective:    HPI  Follow-up for history of cva from 11/26/15; we set her INR goal to 2.5-3.5 due to new CVA occuring when she had INR of 2.0 in December.. We increased coumadin to 7.5 mg on Mondays and fridays. Patient to continue 5 mg al other days.     No Known Allergies Previous Medications   B COMPLEX-BIOTIN-FA (HM VITAMIN B100 COMPLEX PO)    Take 1 tablet by mouth daily.   BIOTIN 5000 MCG CAPS    Take 1 capsule by mouth daily.   CALCIUM-VITAMIN D PO    Take 600 mg by mouth daily.    COENZYME Q10 (CO Q-10) 100 MG CAPS    Take 100 mg by mouth 1 day or 1 dose.     CYANOCOBALAMIN (VITAMIN B 12 PO)    Take by mouth daily.   DILTIAZEM (CARDIZEM CD) 120 MG 24 HR CAPSULE    Take 1 capsule (120 mg total) by mouth 2 (two) times daily.   DILTIAZEM (CARDIZEM SR) 120 MG 12 HR CAPSULE    Take 1 capsule (120 mg total) by mouth 2 (two) times daily.   DONEPEZIL (ARICEPT) 10 MG TABLET    Take 10 mg by mouth daily.   GABAPENTIN (NEURONTIN) 100 MG CAPSULE    Take 1 capsule (100 mg total) by mouth 2 (two) times daily.   KRILL OIL 300 MG CAPS    Take 500 mg by mouth daily.    LAMOTRIGINE (LAMICTAL) 100 MG TABLET    Take 25 mg by mouth 2 (two) times daily.    METOPROLOL (LOPRESSOR) 25 MG TABLET    Take 1 tablet (25 mg total) by mouth 2 (two) times daily.   OXYCODONE-ACETAMINOPHEN (ROXICET) 5-325 MG TABLET    Take 1-2 tablets by mouth every 6 (six) hours as needed.   PROBIOTIC PRODUCT (PROBIOTIC FORMULA) CAPS    Take by mouth.     TURMERIC 500 MG CAPS    Take 800 mg by mouth daily.    VITAMIN C (ASCORBIC ACID) 500 MG TABLET    Take 500 mg by mouth daily.   VITAMIN D, CHOLECALCIFEROL, 1000 UNITS TABS    Take 1,000 Units by mouth daily.   VITAMIN E 400 UNIT CAPSULE     Take 400 Units by mouth daily.     WARFARIN (COUMADIN) 5 MG TABLET    Take 1 tablet by mouth once daily or as directed by physician    Review of Systems  Constitutional: Negative for fever, chills, appetite change and fatigue.  Respiratory: Negative for chest tightness and shortness of breath.   Cardiovascular: Negative for chest pain and palpitations.  Gastrointestinal: Negative for nausea, vomiting and abdominal pain.  Genitourinary: Positive for dysuria, urgency and frequency.  Musculoskeletal: Positive for back pain.  Neurological: Negative for dizziness and weakness.    Social History  Substance Use Topics  . Smoking status: Never Smoker   . Smokeless tobacco: Never Used  . Alcohol Use: No     Comment: wine- occasionally   Objective:   BP 98/54 mmHg  Pulse 55  Temp(Src) 98.3 F (36.8 C) (Oral)  Resp 16  Ht _0  (1.549 m)  Wt 160 lb (  72.576 kg)  BMI 30.25 kg/m2  SpO2 96%  Physical Exam   General Appearance:    Alert, cooperative, no distress  Eyes:    PERRL, conjunctiva/corneas clear, EOM's intact       Lungs:     Clear to auscultation bilaterally, respirations unlabored  Heart:    Irregularly irregular  Neurologic:   Awake, alert, oriented x 3. No apparent focal neurological           defect.   Skin:   Small non-tender lipoma medial aspect left upper arm   Results for orders placed or performed in visit on 12/29/15  POCT INR  Result Value Ref Range   INR 3.7    INR 44.7   POCT urinalysis dipstick  Result Value Ref Range   Color, UA Dark Yellow    Clarity, UA Clear    Glucose, UA Neg    Bilirubin, UA Neg    Ketones, UA Large    Spec Grav, UA 1.020    Blood, UA Moderate    pH, UA 5.0    Protein, UA Neg    Urobilinogen, UA 0.2    Nitrite, UA Neg    Leukocytes, UA Negative Negative       Assessment & Plan:     1. Chronic atrial fibrillation (HCC) She is interested in changing to Xarelto, however her eGFR was under 45 when last checked. She was not  feeling well at that time. Will recheck renal functions and consider switch to Xarelto if improved. Put warfarin on hold while awaiting labs since INR was 3.7 - POCT INR - Renal function panel - CBC  2. Frequency of urination  - POCT urinalysis dipstick - Urine culture - Renal function panel - CBC  3. Chronic leg pain, unspecified laterality Refiled hydrocodone today which states has been effetive.  - Renal function panel - CBC  4. History of CVA (cerebrovascular accident) Last CVA occurred while on therapeutic dose of warfarin. Consider change to xarelto - Renal function panel - CBC  5. Acute cystitis with hematuria  - ciprofloxacin (CIPRO) 500 MG tablet; Take 1 tablet (500 mg total) by mouth 2 (two) times daily.  Dispense: 14 tablet; Refill: 0       Lelon Huh, MD  [facility]

## 2015-12-30 ENCOUNTER — Other Ambulatory Visit: Payer: Self-pay | Admitting: Family Medicine

## 2015-12-30 ENCOUNTER — Ambulatory Visit: Payer: PPO | Admitting: Physical Therapy

## 2015-12-30 ENCOUNTER — Telehealth: Payer: Self-pay

## 2015-12-30 LAB — CBC
Hematocrit: 40.4 % (ref 34.0–46.6)
Hemoglobin: 13.8 g/dL (ref 11.1–15.9)
MCH: 32.2 pg (ref 26.6–33.0)
MCHC: 34.2 g/dL (ref 31.5–35.7)
MCV: 94 fL (ref 79–97)
Platelets: 203 10*3/uL (ref 150–379)
RBC: 4.28 x10E6/uL (ref 3.77–5.28)
RDW: 14 % (ref 12.3–15.4)
WBC: 8.1 10*3/uL (ref 3.4–10.8)

## 2015-12-30 LAB — RENAL FUNCTION PANEL
Albumin: 4.1 g/dL (ref 3.5–4.8)
BUN/Creatinine Ratio: 20 (ref 11–26)
BUN: 20 mg/dL (ref 8–27)
CO2: 23 mmol/L (ref 18–29)
Calcium: 9.9 mg/dL (ref 8.7–10.3)
Chloride: 99 mmol/L (ref 96–106)
Creatinine, Ser: 0.99 mg/dL (ref 0.57–1.00)
GFR calc Af Amer: 65 mL/min/{1.73_m2} (ref >59)
GFR calc non Af Amer: 57 mL/min/{1.73_m2} — ABNORMAL LOW (ref >59)
Glucose: 100 mg/dL — ABNORMAL HIGH (ref 65–99)
Phosphorus: 3.6 mg/dL (ref 2.5–4.5)
Potassium: 4.6 mmol/L (ref 3.5–5.2)
Sodium: 139 mmol/L (ref 134–144)

## 2015-12-30 MED ORDER — RIVAROXABAN 20 MG PO TABS: 20.0000 mg | ORAL_TABLET | Freq: Every day | ORAL | Status: DC

## 2015-12-30 NOTE — Telephone Encounter (Signed)
-----   Message from Birdie Sons, MD sent at 12/30/2015  1:42 PM EST ----- Kidney functions are back to normal. Can go ahead and switch from warfarin to Xarelto 20mg  per day. Recommend start with 30day prescription to local pharmacy. If doing well with this , will send 90 day rx to her mail order. She should start medication before warfarin gets out of her system, so I would recommend starting on Friday.

## 2015-12-30 NOTE — Telephone Encounter (Signed)
LMTCB

## 2015-12-30 NOTE — Telephone Encounter (Signed)
Patient advised as directed below. Patient verbalized understanding. RX sent to local pharmacy.

## 2015-12-31 LAB — URINE CULTURE: Organism ID, Bacteria: NO GROWTH

## 2016-01-03 ENCOUNTER — Telehealth: Payer: Self-pay | Admitting: Family Medicine

## 2016-01-03 NOTE — Telephone Encounter (Signed)
There was no sign of infection on the culture. If she is still having urinary frequency then she will need to see a urologist, otherwise she will need to come in to recheck urinalysis to see if blood has cleared up... She may have had blood in urine due to her INR being elevated last week but we do need to recheck it.

## 2016-01-03 NOTE — Telephone Encounter (Signed)
Pt stated she spoke with Lexine Baton last week and got her lab results but the urine culture wasn't back at that time. Pt would like a call back to get the results of her urine culture that was done on 12/29/15. Please advise. Thanks TNP

## 2016-01-03 NOTE — Telephone Encounter (Signed)
Dr. Caryn Section, patients urine culture results are in and showed no growth. Is it ok to advise patient of this, and do you have any other recommendations for urinary frequency ( in case patient asks)? Thanks

## 2016-01-04 NOTE — Telephone Encounter (Signed)
LMOVM for pt to return call 

## 2016-01-05 NOTE — Telephone Encounter (Signed)
Patient advised as directed below. Patient verbalized understanding. Patient scheduled for a follow up to recheck urine on 01/19/2016.

## 2016-01-19 ENCOUNTER — Encounter: Payer: Self-pay | Admitting: Family Medicine

## 2016-01-19 ENCOUNTER — Ambulatory Visit (INDEPENDENT_AMBULATORY_CARE_PROVIDER_SITE_OTHER): Payer: PPO | Admitting: Family Medicine

## 2016-01-19 VITALS — BP 120/80 | HR 83 | Temp 97.8°F | Resp 16 | Ht 61.0 in | Wt 162.0 lb

## 2016-01-19 DIAGNOSIS — R319 Hematuria, unspecified: Secondary | ICD-10-CM

## 2016-01-19 DIAGNOSIS — R350 Frequency of micturition: Secondary | ICD-10-CM

## 2016-01-19 LAB — POCT URINALYSIS DIPSTICK
Bilirubin, UA: NEGATIVE
Glucose, UA: NEGATIVE
Leukocytes, UA: NEGATIVE
Nitrite, UA: NEGATIVE
Spec Grav, UA: 1.025
Urobilinogen, UA: 0.2
pH, UA: 5

## 2016-01-19 NOTE — Progress Notes (Signed)
Patient: [name] [name] Female    DOB: 12-11-1940   75 y.o.   MRN: [id number] Visit Date: 01/19/2016  Today's Provider: Lelon Huh, MD   Chief Complaint  Patient presents with  . Follow-up  . Urinary Frequency   Subjective:    HPI  Follow-up for urinary frequency from 12/29/2015; UA showed blood. Urine culture was negative for infection. Patient advised to return for office visit to recheck urine to make sure blood has cleared. She states she has had blood in urine for many years and had extensive evaluation for the before becoming a patient here.   Patient is still having urinary frequency with urgency. Patient also reports some irritation in vaginal area.  She is also here to follow up since changing from warfarin to Xarelto at last office visit. Changed was made due to labile INR readings and patient having CVA in December despite therapeutic INR at the time. She states she is doing well with Xarelto with no adverse effects.    No Known Allergies Previous Medications   BIOTIN 5000 MCG CAPS    Take 1 capsule by mouth daily.   CALCIUM-VITAMIN D PO    Take 600 mg by mouth daily.    COENZYME Q10 (CO Q-10) 100 MG CAPS    Take 100 mg by mouth 1 day or 1 dose.     CYANOCOBALAMIN (VITAMIN B 12 PO)    Take by mouth daily.   DILTIAZEM (CARDIZEM CD) 120 MG 24 HR CAPSULE    Take 1 capsule (120 mg total) by mouth 2 (two) times daily.   DONEPEZIL (ARICEPT) 10 MG TABLET    Take 10 mg by mouth daily.   GABAPENTIN (NEURONTIN) 100 MG CAPSULE    Take 1 capsule (100 mg total) by mouth 2 (two) times daily.   KRILL OIL 300 MG CAPS    Take 500 mg by mouth daily.    METOPROLOL (LOPRESSOR) 25 MG TABLET    Take 1 tablet (25 mg total) by mouth 2 (two) times daily.   OXYCODONE-ACETAMINOPHEN (ROXICET) 5-325 MG TABLET    Take 1-2 tablets by mouth every 6 (six) hours as needed.   PROBIOTIC PRODUCT (PROBIOTIC FORMULA) CAPS    Take by mouth.     RIVAROXABAN (XARELTO) 20 MG TABS TABLET    Take 1  tablet (20 mg total) by mouth daily with supper.   TURMERIC PO    Take 800 mg by mouth 2 (two) times daily.   VITAMIN C (ASCORBIC ACID) 500 MG TABLET    Take 500 mg by mouth daily.   VITAMIN D, CHOLECALCIFEROL, 1000 UNITS TABS    Take 1,000 Units by mouth daily.    Review of Systems  Constitutional: Negative for fever, chills, appetite change and fatigue.  Respiratory: Negative for chest tightness and shortness of breath.   Cardiovascular: Negative for chest pain and palpitations.  Gastrointestinal: Negative for nausea, vomiting and abdominal pain.  Genitourinary: Positive for urgency and frequency.  Musculoskeletal: Positive for arthralgias.  Neurological: Negative for dizziness and weakness.    Social History  Substance Use Topics  . Smoking status: Never Smoker   . Smokeless tobacco: Never Used  . Alcohol Use: No     Comment: wine- occasionally   Objective:   BP 120/80 mmHg  Pulse 83  Temp(Src) 97.8 F (36.6 C) (Oral)  Resp 16  Ht 5\' 1"  (1.549 m)  Wt 162 lb (73.483 kg)  BMI 30.63 kg/m2  SpO2  98%  Physical Exam  General appearance: alert, well developed, well nourished, cooperative and in no distress Head: Normocephalic, without obvious abnormality, atraumatic Lungs: Respirations even and unlabored Extremities: No gross deformities Skin: Skin color, texture, turgor normal. No rashes seen  Psych: Appropriate mood and affect. Neurologic: Mental status: Alert, oriented to person, place, and time, thought content appropriate.       Assessment & Plan:     1. Frequency of urination I think this is likely due to irritable bladder. If it becomes more bothersome will consider anti-cholinergic medication.  - POCT urinalysis dipstick - Urinalysis, Routine w reflex microscopic  2. Hematuria Send to [facility] for microscopic evaluation. By patient report this has been throroughly evaluated in remote past and may be exacerbated by chronic anticoagulation.  - Urinalysis,  Routine w reflex microscopic       Lelon Huh, MD  [place]

## 2016-01-20 DIAGNOSIS — M1711 Unilateral primary osteoarthritis, right knee: Secondary | ICD-10-CM | POA: Diagnosis not present

## 2016-01-20 LAB — URINALYSIS, ROUTINE W REFLEX MICROSCOPIC
Bilirubin, UA: NEGATIVE
Glucose, UA: NEGATIVE
Ketones, UA: NEGATIVE
Leukocytes, UA: NEGATIVE
Nitrite, UA: NEGATIVE
RBC, UA: NEGATIVE
Specific Gravity, UA: 1.028 (ref 1.005–1.030)
Urobilinogen, Ur: 0.2 mg/dL (ref 0.2–1.0)
pH, UA: 6 (ref 5.0–7.5)

## 2016-01-25 DIAGNOSIS — R0781 Pleurodynia: Secondary | ICD-10-CM | POA: Diagnosis not present

## 2016-01-25 DIAGNOSIS — M1712 Unilateral primary osteoarthritis, left knee: Secondary | ICD-10-CM | POA: Diagnosis not present

## 2016-01-28 ENCOUNTER — Telehealth: Payer: Self-pay | Admitting: Family Medicine

## 2016-01-28 DIAGNOSIS — M79606 Pain in leg, unspecified: Principal | ICD-10-CM

## 2016-01-28 DIAGNOSIS — G8929 Other chronic pain: Secondary | ICD-10-CM

## 2016-01-28 MED ORDER — OXYCODONE-ACETAMINOPHEN 5-325 MG PO TABS: 1.0000 | ORAL_TABLET | Freq: Four times a day (QID) | ORAL | Status: DC | PRN

## 2016-01-28 NOTE — Telephone Encounter (Signed)
Advised patient as below.  

## 2016-01-28 NOTE — Telephone Encounter (Signed)
Pt contacted office for refill request on the following medications: oxyCODONE-acetaminophen (ROXICET) 5-325 MG tablet Last written on 12/29/15. Last OV: 01/19/16  Pt stated that on Tuesday 01/25/16 she fell off a park bench. Pt stated she went to Urgent Care later that evening and was advised that nothing was broken. Pt stated that she doesn't have any pain as long as she is taking the oxyCODONE-acetaminophen (ROXICET) 5-325 MG tablet. Pt stated that she is concerned about possibly developing a blood clot and wanted to speak with a nurse. Pt stated she doesn't have any swelling or bruising. Pt also request a call back as soon as the RX is ready b/c she only has 4 pills left and she has to find a ride to the office. I advised pt that refill request can take 24 to 48 hours. Please advise. Thanks TNP

## 2016-01-28 NOTE — Telephone Encounter (Signed)
Patient reports that she was sitting on a park bench and it broke and fell with her sitting in it. She reports that she has some soreness on her right side, but does not have any other symptoms. She reports that she went to urgent care and they told her that nothing was broken. She reports that she is on Xarelto and she wants to know due to the fall, is she at risk for a blood clot? She is also requesting a refill on her pain medication (Oxycodone) because she is in a lot of pain. Patient denies any shortness of breath, chest pain, or dizziness. Contact info is correct. Thanks!

## 2016-01-28 NOTE — Telephone Encounter (Signed)
The Xarelto will help prevent blood clots, but she will likely bruise more since she is taking it. She should apply ice to any bruised areas every 3-4 hours.... But it is probably not an issue now since the fall was 3 days ago.   Have printed up oxycodone prescription for her to pick up.

## 2016-01-31 ENCOUNTER — Other Ambulatory Visit: Payer: Self-pay | Admitting: Family Medicine

## 2016-01-31 MED ORDER — RIVAROXABAN 20 MG PO TABS: 20.0000 mg | ORAL_TABLET | Freq: Every day | ORAL | Status: DC

## 2016-01-31 NOTE — Telephone Encounter (Signed)
Pt needs refill on her rivaroxaban (XARELTO) 20 MG TABS tablet  She wants to know if she can have 3 months supply.  She uses [facility]  Thanks, [employer]

## 2016-01-31 NOTE — Telephone Encounter (Signed)
Requesting 90 day supply.

## 2016-02-01 DIAGNOSIS — M1712 Unilateral primary osteoarthritis, left knee: Secondary | ICD-10-CM | POA: Diagnosis not present

## 2016-02-08 DIAGNOSIS — M1712 Unilateral primary osteoarthritis, left knee: Secondary | ICD-10-CM | POA: Diagnosis not present

## 2016-02-09 DIAGNOSIS — H26491 Other secondary cataract, right eye: Secondary | ICD-10-CM | POA: Diagnosis not present

## 2016-02-16 ENCOUNTER — Ambulatory Visit (INDEPENDENT_AMBULATORY_CARE_PROVIDER_SITE_OTHER): Payer: PPO | Admitting: Family Medicine

## 2016-02-16 ENCOUNTER — Encounter: Payer: Self-pay | Admitting: Family Medicine

## 2016-02-16 ENCOUNTER — Ambulatory Visit (INDEPENDENT_AMBULATORY_CARE_PROVIDER_SITE_OTHER): Payer: PPO | Admitting: Cardiovascular Disease

## 2016-02-16 ENCOUNTER — Encounter: Payer: Self-pay | Admitting: Cardiovascular Disease

## 2016-02-16 ENCOUNTER — Encounter
Admission: RE | Admit: 2016-02-16 | Discharge: 2016-02-16 | Disposition: A | Discharge status: Home / Self Care | Payer: PPO | Source: Ambulatory Visit | Attending: Orthopedic Surgery | Admitting: Orthopedic Surgery

## 2016-02-16 VITALS — BP 118/58 | HR 77 | Temp 98.2°F | Resp 16 | Ht 61.0 in | Wt 159.0 lb

## 2016-02-16 VITALS — BP 130/64 | HR 65 | Ht 61.0 in | Wt 159.0 lb

## 2016-02-16 DIAGNOSIS — I482 Chronic atrial fibrillation, unspecified: Secondary | ICD-10-CM

## 2016-02-16 DIAGNOSIS — M170 Bilateral primary osteoarthritis of knee: Secondary | ICD-10-CM

## 2016-02-16 DIAGNOSIS — R569 Unspecified convulsions: Secondary | ICD-10-CM

## 2016-02-16 DIAGNOSIS — I10 Essential (primary) hypertension: Secondary | ICD-10-CM | POA: Diagnosis not present

## 2016-02-16 DIAGNOSIS — Z01812 Encounter for preprocedural laboratory examination: Secondary | ICD-10-CM | POA: Diagnosis not present

## 2016-02-16 DIAGNOSIS — Z8673 Personal history of transient ischemic attack (TIA), and cerebral infarction without residual deficits: Secondary | ICD-10-CM

## 2016-02-16 DIAGNOSIS — M1712 Unilateral primary osteoarthritis, left knee: Secondary | ICD-10-CM | POA: Diagnosis not present

## 2016-02-16 DIAGNOSIS — M1710 Unilateral primary osteoarthritis, unspecified knee: Secondary | ICD-10-CM | POA: Insufficient documentation

## 2016-02-16 DIAGNOSIS — Z01818 Encounter for other preprocedural examination: Secondary | ICD-10-CM | POA: Diagnosis not present

## 2016-02-16 DIAGNOSIS — M179 Osteoarthritis of knee, unspecified: Secondary | ICD-10-CM | POA: Insufficient documentation

## 2016-02-16 LAB — URINALYSIS COMPLETE WITH MICROSCOPIC (ARMC ONLY)
Bacteria, UA: NONE SEEN
Bilirubin Urine: NEGATIVE
Glucose, UA: NEGATIVE mg/dL
Ketones, ur: NEGATIVE mg/dL
Leukocytes, UA: NEGATIVE
Nitrite: NEGATIVE
Protein, ur: NEGATIVE mg/dL
Specific Gravity, Urine: 1.026 (ref 1.005–1.030)
pH: 5 (ref 5.0–8.0)

## 2016-02-16 LAB — PROTIME-INR
INR: 1.79
Prothrombin Time: 20.8 seconds — ABNORMAL HIGH (ref 11.4–15.0)

## 2016-02-16 LAB — COMPREHENSIVE METABOLIC PANEL
ALT: 19 U/L (ref 14–54)
AST: 24 U/L (ref 15–41)
Albumin: 4.4 g/dL (ref 3.5–5.0)
Alkaline Phosphatase: 79 U/L (ref 38–126)
Anion gap: 11 (ref 5–15)
BUN: 21 mg/dL — ABNORMAL HIGH (ref 6–20)
CO2: 25 mmol/L (ref 22–32)
Calcium: 10 mg/dL (ref 8.9–10.3)
Chloride: 103 mmol/L (ref 101–111)
Creatinine, Ser: 0.82 mg/dL (ref 0.44–1.00)
GFR calc Af Amer: >60 mL/min (ref >60)
GFR calc non Af Amer: >60 mL/min (ref >60)
Glucose, Bld: 119 mg/dL — ABNORMAL HIGH (ref 65–99)
Potassium: 4 mmol/L (ref 3.5–5.1)
Sodium: 139 mmol/L (ref 135–145)
Total Bilirubin: 0.8 mg/dL (ref 0.3–1.2)
Total Protein: 7.2 g/dL (ref 6.5–8.1)

## 2016-02-16 LAB — ABO/RH: ABO/RH(D): A POS

## 2016-02-16 LAB — CBC
HCT: 44.2 % (ref 35.0–47.0)
Hemoglobin: 15 g/dL (ref 12.0–16.0)
MCH: 32.8 pg (ref 26.0–34.0)
MCHC: 33.9 g/dL (ref 32.0–36.0)
MCV: 96.9 fL (ref 80.0–100.0)
Platelets: 187 10*3/uL (ref 150–440)
RBC: 4.57 MIL/uL (ref 3.80–5.20)
RDW: 12.8 % (ref 11.5–14.5)
WBC: 8.7 10*3/uL (ref 3.6–11.0)

## 2016-02-16 LAB — TYPE AND SCREEN
ABO/RH(D): A POS
Antibody Screen: NEGATIVE

## 2016-02-16 LAB — APTT: aPTT: 30 seconds (ref 24–36)

## 2016-02-16 LAB — MRSA PCR SCREENING: MRSA by PCR: NEGATIVE

## 2016-02-16 LAB — SEDIMENTATION RATE: Sed Rate: 6 mm/hr (ref 0–30)

## 2016-02-16 NOTE — Patient Instructions (Addendum)
You are doing well. No medication changes were made.  Please call us if you have new issues that need to be addressed before your next appt.  Your physician wants you to follow-up in: 12 months.  You will receive a reminder letter in the mail two months in advance. If you don't receive a letter, please call our office to schedule the follow-up appointment. 

## 2016-02-16 NOTE — Patient Instructions (Addendum)
  Your procedure is scheduled on: 03/01/16 Wed Report to Same Day Surgery 2nd floor medical mall To find out your arrival time please call [phone] between 1PM - 3PM on  02/29/16 Tues   Remember: Instructions that are not followed completely may result in serious medical risk, up to and including death, or upon the discretion of your surgeon and anesthesiologist your surgery may need to be rescheduled.    _x___ 1. Do not eat food or drink liquids after midnight. No gum chewing or hard candies.     ____ 2. No Alcohol for 24 hours before or after surgery.   ____ 3. Bring all medications with you on the day of surgery if instructed.    __x__ 4. Notify your doctor if there is any change in your medical condition     (cold, fever, infections).     Do not wear jewelry, make-up, hairpins, clips or nail polish.  Do not wear lotions, powders, or perfumes. You may wear deodorant.  Do not shave 48 hours prior to surgery. Men may shave face and neck.  Do not bring valuables to the hospital.    [facility] is not responsible for any belongings or valuables.               Contacts, dentures or bridgework may not be worn into surgery.  Leave your suitcase in the car. After surgery it may be brought to your room.  For patients admitted to the hospital, discharge time is determined by your treatment team.   Patients discharged the day of surgery will not be allowed to drive home.    Please read over the following fact sheets that you were given:   [facility] Preparing for Surgery and or MRSA Information   _x___ Take these medicines the morning of surgery with A SIP OF WATER:    1. diltiazem (CARDIZEM CD) 120 MG 24 hr capsule  2.donepezil (ARICEPT) 5 MG tablet  3.gabapentin (NEURONTIN) 100 MG capsule  4.oxyCODONE-acetaminophen (ROXICET) 5-325 MG tablet if needed  5.  6.  ____ Fleet Enema (as directed)   ____ Use CHG Soap or sage wipes as directed on instruction sheet   ____ Use inhalers  on the day of surgery and bring to hospital day of surgery  ____ Stop metformin 2 days prior to surgery    ____ Take 1/2 of usual insulin dose the night before surgery and none on the morning of           surgery.   __x__ Stop aspirin or coumadin, or plavix  Stop xarelto 2 days before surgery or per Dr Rockey Situ  _x__ Stop Anti-inflammatories such as Advil, Aleve, Ibuprofen, Motrin, Naproxen,          Naprosyn, Goodies powders or aspirin products. Ok to take Tylenol.   _x___ Stop supplements until after surgery.  Stop fish oil 1 week before surgery ____ Bring C-Pap to the hospital.

## 2016-02-16 NOTE — Assessment & Plan Note (Signed)
Blood pressure is well controlled on today's visit. No changes made to the medications. 

## 2016-02-16 NOTE — Assessment & Plan Note (Signed)
Chronic atrial fibrillation, rate well controlled No changes to her medications Tolerating anticoagulation, NOAC

## 2016-02-16 NOTE — Progress Notes (Signed)
Patient: [name] [name] Female    DOB: 1941/09/18   75 y.o.   MRN: [id number] Visit Date: 02/16/2016  Today's Provider: Lelon Huh, MD   Chief Complaint  Patient presents with  . Pre-op Exam   Subjective:    HPI  Surgical clearance visit; computer assisted total knee arthoplasty, of right knee. Surgeon Dr. Marry Guan. Date of surgery:03/01/2016. She is on Xareto for stroke prophylaxis due to atrial fibrillation and is tolerating it well. She takes it at 6pm every night. Has had no neurological symptoms of cerebral ischemia, or seizure activity since CVA in November. She had follow up with Dr. Manuella Ghazi in January and advised to follow up yearly. She last saw Dr. Rockey Situ and October, 2016, and advised to follow up after six months. She denies chest pain or dyspnea on exertion. She has no history of surgical or anesthesia complications.    No Known Allergies Previous Medications   BIOTIN 5000 MCG CAPS    Take 1 capsule by mouth daily.   CALCIUM-VITAMIN D PO    Take 600 mg by mouth daily.    CHOLECALCIFEROL (VITAMIN D3) 2000 UNITS TABS    Take 200 Units by mouth daily.   COENZYME Q10 (CO Q-10) 100 MG CAPS    Take 100 mg by mouth 1 day or 1 dose.     CYANOCOBALAMIN (VITAMIN B 12 PO)    Take 100 mg by mouth daily.    DILTIAZEM (CARDIZEM CD) 120 MG 24 HR CAPSULE    Take 1 capsule (120 mg total) by mouth 2 (two) times daily.   DONEPEZIL (ARICEPT) 10 MG TABLET    Take 10 mg by mouth daily.   GABAPENTIN (NEURONTIN) 100 MG CAPSULE    Take 1 capsule (100 mg total) by mouth 2 (two) times daily.   KRILL OIL 300 MG CAPS    Take 350 mg by mouth daily.    MAG ASPART-POTASSIUM ASPART (POTASSIUM & MAGNESIUM ASPARTAT PO)    Take by mouth daily.   METOPROLOL (LOPRESSOR) 25 MG TABLET    Take 1 tablet (25 mg total) by mouth 2 (two) times daily.   OXYCODONE-ACETAMINOPHEN (ROXICET) 5-325 MG TABLET    Take 1-2 tablets by mouth every 6 (six) hours as needed.   PROBIOTIC PRODUCT (PROBIOTIC FORMULA) CAPS     Take by mouth.     RIVAROXABAN (XARELTO) 20 MG TABS TABLET    Take 1 tablet (20 mg total) by mouth daily with supper.   TURMERIC PO    Take by mouth. Takes 800 mg in the morning and 500 mg in the evening   Past Surgical History  Procedure Laterality Date  . Ablation  [place], [place]  . Tonsillectomy    . Bilateral wrist surgery  2003  . Colonoscopy    . Tonsillectomy    . Breast surgery    . Cataract extraction w/phaco Right 06/01/2015    Procedure: CATARACT EXTRACTION PHACO AND INTRAOCULAR LENS PLACEMENT (IOC);  Surgeon: Birder Robson, MD;  Location: [facility] ORS;  Service: Ophthalmology;  Laterality: Right;  [place]: 01:02.0 AP%: 23.1 CDE: 14.33 Fluid lot# [id number]   . Cataract extraction w/phaco Left 06/22/2015    Procedure: CATARACT EXTRACTION PHACO AND INTRAOCULAR LENS PLACEMENT (IOC);  Surgeon: Birder Robson, MD;  Location: [facility] ORS;  Service: Ophthalmology;  Laterality: Left;  [place]: 01:00.7 AP%: 23.1 CDE: 14.01 Lot # [id number]    Review of Systems  Constitutional: Negative for fever, chills, appetite  change and fatigue.  Respiratory: Negative for chest tightness and shortness of breath.   Cardiovascular: Negative for chest pain and palpitations.  Gastrointestinal: Negative for nausea, vomiting and abdominal pain.  Musculoskeletal: Positive for myalgias.       Back ache  Neurological: Negative for dizziness and weakness.    Social History  Substance Use Topics  . Smoking status: Never Smoker   . Smokeless tobacco: Never Used  . Alcohol Use: No     Comment: wine- occasionally   Objective:   BP 118/58 mmHg  Pulse 77  Temp(Src) 98.2 F (36.8 C) (Oral)  Resp 16  Ht 5\' 1"  (1.549 m)  Wt 159 lb (72.122 kg)  BMI 30.06 kg/m2  SpO2 97%  Physical Exam   General Appearance:    Alert, cooperative, no distress  Eyes:    PERRL, conjunctiva/corneas clear, EOM's intact       Lungs:     Clear to auscultation bilaterally, respirations unlabored  Heart:    Irregularly  irregular rhythm. Normal rate No murmurs or gallops. No carotid bruits.    Neurologic:   Awake, alert, oriented x 3. No apparent focal neurological           defect.           Assessment & Plan:     1. Pre-op exam Is due for routine follow up cardiology and needs cardiac clearance for surgery. Is otherwise at low risk for complications and has no relative or absolution contraindications for surgery or GA.   2. HYPERTENSION, BENIGN Well controlled.  Continue current medications.    3. Chronic atrial fibrillation (HCC) Doing well on Plavix. Advised she should not take Plavix within 24 prior to surgery, but period off of anticoagulation should be as short as possible to minimize risk of atrial thrombus.  - Ambulatory referral to Cardiology for pre-op  4. History of CVA (cerebrovascular accident) Now asymptomatic. Continue anticoagulation.   5. Seizures ([place]) No seizures on current medication regiment.   6. Primary osteoarthritis of left knee Knee replacement planned for May 10th, 2017.    Patient Instructions   Do not take Xarelto the day before and the day of your surgery.                                                                                         Your last dose of Xarelto should be at least 24 hours before the start of your surgery.   You should resume Xarelto within 24 hours of completing surgery if there are no bleeding complications.          Lelon Huh, MD  [facility]

## 2016-02-16 NOTE — Assessment & Plan Note (Signed)
She has indicated that she would like to have knee or placement surgery in the future as pain is severe. We will hold the anticoagulation several days prior to the surgery, no further testing would be needed. She would be acceptable risk.

## 2016-02-16 NOTE — Patient Instructions (Addendum)
   Do not take Xarelto the day before and the day of your surgery.                                                                                         Your last dose of Xarelto should be at least 24 hours before the start of your surgery.   You should resume Xarelto within 24 hours of completing surgery if there are no bleeding complications.
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
## 2016-02-18 LAB — URINE CULTURE: Special Requests: NORMAL

## 2016-02-24 ENCOUNTER — Ambulatory Visit: Payer: PPO | Admitting: Nurse Practitioner

## 2016-03-01 ENCOUNTER — Encounter
Admission: RE | Disposition: A | Discharge status: Skilled Nursing Facility | Payer: Self-pay | Source: Ambulatory Visit | Attending: Orthopedic Surgery

## 2016-03-01 ENCOUNTER — Encounter: Payer: Self-pay | Admitting: Orthopedic Surgery

## 2016-03-01 ENCOUNTER — Inpatient Hospital Stay: Payer: PPO | Admitting: Registered Nurse

## 2016-03-01 ENCOUNTER — Inpatient Hospital Stay
Admission: RE | Admit: 2016-03-01 | Discharge: 2016-03-04 | DRG: 470 | Disposition: A | Discharge status: Skilled Nursing Facility | Payer: PPO | Source: Ambulatory Visit | Attending: Orthopedic Surgery | Admitting: Orthopedic Surgery

## 2016-03-01 ENCOUNTER — Inpatient Hospital Stay: Payer: PPO

## 2016-03-01 DIAGNOSIS — Z79899 Other long term (current) drug therapy: Secondary | ICD-10-CM

## 2016-03-01 DIAGNOSIS — Z471 Aftercare following joint replacement surgery: Secondary | ICD-10-CM | POA: Diagnosis not present

## 2016-03-01 DIAGNOSIS — Z86718 Personal history of other venous thrombosis and embolism: Secondary | ICD-10-CM | POA: Diagnosis not present

## 2016-03-01 DIAGNOSIS — Z8673 Personal history of transient ischemic attack (TIA), and cerebral infarction without residual deficits: Secondary | ICD-10-CM | POA: Diagnosis not present

## 2016-03-01 DIAGNOSIS — R569 Unspecified convulsions: Secondary | ICD-10-CM | POA: Diagnosis not present

## 2016-03-01 DIAGNOSIS — I10 Essential (primary) hypertension: Secondary | ICD-10-CM | POA: Diagnosis not present

## 2016-03-01 DIAGNOSIS — M1711 Unilateral primary osteoarthritis, right knee: Secondary | ICD-10-CM | POA: Diagnosis not present

## 2016-03-01 DIAGNOSIS — Z96651 Presence of right artificial knee joint: Secondary | ICD-10-CM | POA: Diagnosis not present

## 2016-03-01 DIAGNOSIS — I482 Chronic atrial fibrillation: Secondary | ICD-10-CM | POA: Diagnosis not present

## 2016-03-01 DIAGNOSIS — Z96659 Presence of unspecified artificial knee joint: Secondary | ICD-10-CM

## 2016-03-01 HISTORY — PX: KNEE ARTHROPLASTY: SHX992

## 2016-03-01 SURGERY — ARTHROPLASTY, KNEE, TOTAL, USING IMAGELESS COMPUTER-ASSISTED NAVIGATION
Anesthesia: Spinal | Laterality: Right | Wound class: Clean

## 2016-03-01 MED ORDER — FAMOTIDINE 20 MG PO TABS
ORAL_TABLET | ORAL | Status: AC
  Administered 2016-03-01: 20 mg
  Filled 2016-03-01: qty 1

## 2016-03-01 MED ORDER — MORPHINE SULFATE (PF) 2 MG/ML IV SOLN
2.0000 mg | INTRAVENOUS | Status: DC | PRN
  Administered 2016-03-01: 2 mg via INTRAVENOUS
  Filled 2016-03-01: qty 1

## 2016-03-01 MED ORDER — CALCIUM CARBONATE-VITAMIN D 500-200 MG-UNIT PO TABS
1.0000 | ORAL_TABLET | Freq: Every day | ORAL | Status: DC
  Administered 2016-03-02 – 2016-03-04 (×3): 1 via ORAL
  Filled 2016-03-01 (×3): qty 1

## 2016-03-01 MED ORDER — OMEGA-3-ACID ETHYL ESTERS 1 G PO CAPS
1000.0000 mg | ORAL_CAPSULE | Freq: Every day | ORAL | Status: DC
  Administered 2016-03-02 – 2016-03-04 (×3): 1000 mg via ORAL
  Filled 2016-03-01 (×3): qty 1

## 2016-03-01 MED ORDER — RENA-VITE PO TABS
1.0000 | ORAL_TABLET | Freq: Every day | ORAL | Status: DC
Start: 2016-03-01 — End: 2016-03-04
  Administered 2016-03-02 – 2016-03-04 (×3): 1 via ORAL
  Filled 2016-03-01 (×4): qty 1

## 2016-03-01 MED ORDER — VITAMIN B-12 100 MCG PO TABS
100.0000 ug | ORAL_TABLET | Freq: Every day | ORAL | Status: DC
  Administered 2016-03-02 – 2016-03-04 (×3): 100 ug via ORAL
  Filled 2016-03-01 (×5): qty 1

## 2016-03-01 MED ORDER — DILTIAZEM HCL ER COATED BEADS 120 MG PO CP24
120.0000 mg | ORAL_CAPSULE | Freq: Every day | ORAL | Status: DC
  Administered 2016-03-02 – 2016-03-04 (×3): 120 mg via ORAL
  Filled 2016-03-01 (×3): qty 1

## 2016-03-01 MED ORDER — MAGNESIUM HYDROXIDE 400 MG/5ML PO SUSP: 30.0000 mL | Freq: Every day | ORAL | Status: DC | PRN

## 2016-03-01 MED ORDER — CO Q-10 100 MG PO CAPS: 100.0000 mg | ORAL_CAPSULE | ORAL | Status: DC

## 2016-03-01 MED ORDER — ONDANSETRON HCL 4 MG/2ML IJ SOLN: 4.0000 mg | Freq: Once | INTRAMUSCULAR | Status: DC | PRN

## 2016-03-01 MED ORDER — ACETAMINOPHEN 10 MG/ML IV SOLN
1000.0000 mg | Freq: Four times a day (QID) | INTRAVENOUS | Status: AC
  Administered 2016-03-01 – 2016-03-02 (×4): 1000 mg via INTRAVENOUS
  Filled 2016-03-01 (×4): qty 100

## 2016-03-01 MED ORDER — OXYCODONE HCL 5 MG PO TABS
5.0000 mg | ORAL_TABLET | ORAL | Status: DC | PRN
Start: 2016-03-01 — End: 2016-03-04
  Administered 2016-03-01: 10 mg via ORAL
  Administered 2016-03-01 (×2): 5 mg via ORAL
  Administered 2016-03-02: 10 mg via ORAL
  Administered 2016-03-02: 5 mg via ORAL
  Administered 2016-03-02 – 2016-03-04 (×6): 10 mg via ORAL
  Filled 2016-03-01: qty 2
  Filled 2016-03-01: qty 1
  Filled 2016-03-01 (×5): qty 2
  Filled 2016-03-01: qty 1
  Filled 2016-03-01: qty 2
  Filled 2016-03-01: qty 1
  Filled 2016-03-01 (×2): qty 2

## 2016-03-01 MED ORDER — PROBIOTIC FORMULA PO CAPS: 1.0000 | ORAL_CAPSULE | Freq: Every day | ORAL | Status: DC

## 2016-03-01 MED ORDER — VITAMIN D 1000 UNITS PO TABS
2000.0000 [IU] | ORAL_TABLET | Freq: Every day | ORAL | Status: DC
  Administered 2016-03-02 – 2016-03-04 (×3): 2000 [IU] via ORAL
  Filled 2016-03-01 (×3): qty 2

## 2016-03-01 MED ORDER — RIVAROXABAN 20 MG PO TABS
20.0000 mg | ORAL_TABLET | Freq: Every day | ORAL | Status: DC
  Administered 2016-03-02 – 2016-03-03 (×2): 20 mg via ORAL
  Filled 2016-03-01 (×2): qty 1

## 2016-03-01 MED ORDER — POTASSIUM CHLORIDE CRYS ER 10 MEQ PO TBCR
10.0000 meq | EXTENDED_RELEASE_TABLET | Freq: Every day | ORAL | Status: DC
  Administered 2016-03-01 – 2016-03-03 (×3): 10 meq via ORAL
  Filled 2016-03-01 (×3): qty 1

## 2016-03-01 MED ORDER — LACTATED RINGERS IV SOLN
INTRAVENOUS | Status: DC
  Administered 2016-03-01: 11:00:00 via INTRAVENOUS

## 2016-03-01 MED ORDER — CEFAZOLIN SODIUM-DEXTROSE 2-4 GM/100ML-% IV SOLN
2.0000 g | Freq: Four times a day (QID) | INTRAVENOUS | Status: AC
  Administered 2016-03-01 – 2016-03-02 (×4): 2 g via INTRAVENOUS
  Filled 2016-03-01 (×5): qty 100

## 2016-03-01 MED ORDER — ACETAMINOPHEN 650 MG RE SUPP: 650.0000 mg | Freq: Four times a day (QID) | RECTAL | Status: DC | PRN

## 2016-03-01 MED ORDER — ACETAMINOPHEN 10 MG/ML IV SOLN
INTRAVENOUS | Status: AC
  Filled 2016-03-01: qty 100

## 2016-03-01 MED ORDER — NAPHAZOLINE-GLYCERIN 0.012-0.2 % OP SOLN: 1.0000 [drp] | Freq: Every day | OPHTHALMIC | Status: DC | PRN

## 2016-03-01 MED ORDER — METOCLOPRAMIDE HCL 10 MG PO TABS
10.0000 mg | ORAL_TABLET | Freq: Three times a day (TID) | ORAL | Status: AC
  Administered 2016-03-01 – 2016-03-03 (×8): 10 mg via ORAL
  Filled 2016-03-01 (×8): qty 1

## 2016-03-01 MED ORDER — TRAMADOL HCL 50 MG PO TABS
50.0000 mg | ORAL_TABLET | ORAL | Status: DC | PRN
  Administered 2016-03-02 – 2016-03-03 (×2): 100 mg via ORAL
  Administered 2016-03-03: 50 mg via ORAL
  Filled 2016-03-01: qty 1
  Filled 2016-03-01 (×2): qty 2

## 2016-03-01 MED ORDER — PROPOFOL 10 MG/ML IV BOLUS
INTRAVENOUS | Status: DC | PRN
  Administered 2016-03-01: 20 mg via INTRAVENOUS
  Administered 2016-03-01: 30 mg via INTRAVENOUS
  Administered 2016-03-01: 20 mg via INTRAVENOUS
  Administered 2016-03-01: 30 mg via INTRAVENOUS

## 2016-03-01 MED ORDER — CEFAZOLIN SODIUM-DEXTROSE 2-4 GM/100ML-% IV SOLN: 2.0000 g | INTRAVENOUS | Status: DC

## 2016-03-01 MED ORDER — NEOMYCIN-POLYMYXIN B GU 40-200000 IR SOLN
Status: AC
  Filled 2016-03-01: qty 20

## 2016-03-01 MED ORDER — FENTANYL CITRATE (PF) 100 MCG/2ML IJ SOLN
INTRAMUSCULAR | Status: DC | PRN
  Administered 2016-03-01 (×5): 25 ug via INTRAVENOUS
  Administered 2016-03-01: 50 ug via INTRAVENOUS
  Administered 2016-03-01: 25 ug via INTRAVENOUS

## 2016-03-01 MED ORDER — FENTANYL CITRATE (PF) 100 MCG/2ML IJ SOLN
25.0000 ug | INTRAMUSCULAR | Status: AC | PRN
  Administered 2016-03-01 (×6): 25 ug via INTRAVENOUS

## 2016-03-01 MED ORDER — ACETAMINOPHEN 325 MG PO TABS: 650.0000 mg | ORAL_TABLET | Freq: Four times a day (QID) | ORAL | Status: DC | PRN

## 2016-03-01 MED ORDER — MIDAZOLAM HCL 5 MG/5ML IJ SOLN
INTRAMUSCULAR | Status: DC | PRN
  Administered 2016-03-01: 2 mg via INTRAVENOUS

## 2016-03-01 MED ORDER — BUPIVACAINE LIPOSOME 1.3 % IJ SUSP
INTRAMUSCULAR | Status: AC
  Filled 2016-03-01: qty 20

## 2016-03-01 MED ORDER — LIDOCAINE HCL 2 % EX GEL
CUTANEOUS | Status: DC | PRN
  Administered 2016-03-01: 1 via TOPICAL

## 2016-03-01 MED ORDER — FERROUS SULFATE 325 (65 FE) MG PO TABS
325.0000 mg | ORAL_TABLET | Freq: Two times a day (BID) | ORAL | Status: DC
  Administered 2016-03-02 – 2016-03-03 (×3): 325 mg via ORAL
  Filled 2016-03-01 (×5): qty 1

## 2016-03-01 MED ORDER — TETRACAINE HCL 1 % IJ SOLN
INTRAMUSCULAR | Status: DC | PRN
  Administered 2016-03-01: 10 mg via INTRASPINAL

## 2016-03-01 MED ORDER — SODIUM CHLORIDE 0.9 % IV SOLN
INTRAVENOUS | Status: DC | PRN
  Administered 2016-03-01: 60 mL

## 2016-03-01 MED ORDER — PHENYLEPHRINE HCL 10 MG/ML IJ SOLN
INTRAMUSCULAR | Status: DC | PRN
  Administered 2016-03-01 (×3): 100 ug via INTRAVENOUS

## 2016-03-01 MED ORDER — SODIUM CHLORIDE 0.9 % IV SOLN
Freq: Once | INTRAVENOUS | Status: AC
  Administered 2016-03-01: 18:00:00 via INTRAVENOUS

## 2016-03-01 MED ORDER — SODIUM CHLORIDE 0.9 % IV SOLN
INTRAVENOUS | Status: DC
  Administered 2016-03-01 – 2016-03-02 (×2): via INTRAVENOUS

## 2016-03-01 MED ORDER — BIOTIN 5000 MCG PO CAPS: 1.0000 | ORAL_CAPSULE | Freq: Every day | ORAL | Status: DC

## 2016-03-01 MED ORDER — PHENOL 1.4 % MT LIQD: 1.0000 | OROMUCOSAL | Status: DC | PRN

## 2016-03-01 MED ORDER — ONDANSETRON HCL 4 MG PO TABS: 4.0000 mg | ORAL_TABLET | Freq: Four times a day (QID) | ORAL | Status: DC | PRN

## 2016-03-01 MED ORDER — PANTOPRAZOLE SODIUM 40 MG PO TBEC
40.0000 mg | DELAYED_RELEASE_TABLET | Freq: Two times a day (BID) | ORAL | Status: DC
  Administered 2016-03-01 – 2016-03-04 (×6): 40 mg via ORAL
  Filled 2016-03-01 (×6): qty 1

## 2016-03-01 MED ORDER — SODIUM CHLORIDE FLUSH 0.9 % IV SOLN
INTRAVENOUS | Status: AC
  Administered 2016-03-01: 10 mL
  Filled 2016-03-01: qty 10

## 2016-03-01 MED ORDER — BISACODYL 10 MG RE SUPP: 10.0000 mg | Freq: Every day | RECTAL | Status: DC | PRN

## 2016-03-01 MED ORDER — FAMOTIDINE 20 MG PO TABS: 20.0000 mg | ORAL_TABLET | Freq: Once | ORAL | Status: DC

## 2016-03-01 MED ORDER — DONEPEZIL HCL 5 MG PO TABS
5.0000 mg | ORAL_TABLET | Freq: Every morning | ORAL | Status: DC
  Administered 2016-03-02 – 2016-03-04 (×3): 5 mg via ORAL
  Filled 2016-03-01 (×3): qty 1

## 2016-03-01 MED ORDER — MAGNESIUM OXIDE 400 (241.3 MG) MG PO TABS
400.0000 mg | ORAL_TABLET | Freq: Every day | ORAL | Status: DC
  Administered 2016-03-02 – 2016-03-04 (×3): 400 mg via ORAL
  Filled 2016-03-01 (×3): qty 1

## 2016-03-01 MED ORDER — GABAPENTIN 100 MG PO CAPS
100.0000 mg | ORAL_CAPSULE | Freq: Two times a day (BID) | ORAL | Status: DC
  Administered 2016-03-01 – 2016-03-04 (×6): 100 mg via ORAL
  Filled 2016-03-01 (×6): qty 1

## 2016-03-01 MED ORDER — FENTANYL CITRATE (PF) 100 MCG/2ML IJ SOLN
INTRAMUSCULAR | Status: AC
  Filled 2016-03-01: qty 2

## 2016-03-01 MED ORDER — MENTHOL 3 MG MT LOZG: 1.0000 | LOZENGE | OROMUCOSAL | Status: DC | PRN

## 2016-03-01 MED ORDER — PROPOFOL 500 MG/50ML IV EMUL
INTRAVENOUS | Status: DC | PRN
  Administered 2016-03-01: 50 ug/kg/min via INTRAVENOUS

## 2016-03-01 MED ORDER — EPHEDRINE SULFATE 50 MG/ML IJ SOLN
INTRAMUSCULAR | Status: DC | PRN
  Administered 2016-03-01 (×2): 10 mg via INTRAVENOUS

## 2016-03-01 MED ORDER — METOPROLOL TARTRATE 25 MG PO TABS
25.0000 mg | ORAL_TABLET | Freq: Two times a day (BID) | ORAL | Status: DC
  Administered 2016-03-01 – 2016-03-04 (×6): 25 mg via ORAL
  Filled 2016-03-01 (×6): qty 1

## 2016-03-01 MED ORDER — CHLORHEXIDINE GLUCONATE 4 % EX LIQD: 60.0000 mL | Freq: Once | CUTANEOUS | Status: DC

## 2016-03-01 MED ORDER — BUPIVACAINE HCL (PF) 0.5 % IJ SOLN
INTRAMUSCULAR | Status: DC | PRN
  Administered 2016-03-01: 2 mL

## 2016-03-01 MED ORDER — ACETAMINOPHEN 10 MG/ML IV SOLN
INTRAVENOUS | Status: DC | PRN
  Administered 2016-03-01: 1000 mg via INTRAVENOUS

## 2016-03-01 MED ORDER — ALUM & MAG HYDROXIDE-SIMETH 200-200-20 MG/5ML PO SUSP
30.0000 mL | ORAL | Status: DC | PRN
  Administered 2016-03-02: 30 mL via ORAL
  Filled 2016-03-01: qty 30

## 2016-03-01 MED ORDER — SENNOSIDES-DOCUSATE SODIUM 8.6-50 MG PO TABS
1.0000 | ORAL_TABLET | Freq: Two times a day (BID) | ORAL | Status: DC
  Administered 2016-03-01 – 2016-03-03 (×4): 1 via ORAL
  Filled 2016-03-01 (×6): qty 1

## 2016-03-01 MED ORDER — TETRACAINE HCL 1 % IJ SOLN
INTRAMUSCULAR | Status: AC
  Filled 2016-03-01: qty 2

## 2016-03-01 MED ORDER — BUPIVACAINE-EPINEPHRINE (PF) 0.25% -1:200000 IJ SOLN
INTRAMUSCULAR | Status: AC
  Filled 2016-03-01: qty 30

## 2016-03-01 MED ORDER — BUPIVACAINE-EPINEPHRINE 0.25% -1:200000 IJ SOLN
INTRAMUSCULAR | Status: DC | PRN
  Administered 2016-03-01: 30 mL

## 2016-03-01 MED ORDER — NEOMYCIN-POLYMYXIN B GU 40-200000 IR SOLN
Status: DC | PRN
  Administered 2016-03-01: 14 mL

## 2016-03-01 MED ORDER — ONDANSETRON HCL 4 MG/2ML IJ SOLN: 4.0000 mg | Freq: Four times a day (QID) | INTRAMUSCULAR | Status: DC | PRN

## 2016-03-01 MED ORDER — LACTATED RINGERS IV SOLN: INTRAVENOUS | Status: DC

## 2016-03-01 MED ORDER — DIPHENHYDRAMINE HCL 12.5 MG/5ML PO ELIX
12.5000 mg | ORAL_SOLUTION | ORAL | Status: DC | PRN
  Filled 2016-03-01: qty 10

## 2016-03-01 MED ORDER — SODIUM CHLORIDE 0.9 % IJ SOLN
INTRAMUSCULAR | Status: AC
  Filled 2016-03-01: qty 50

## 2016-03-01 MED ORDER — FLEET ENEMA 7-19 GM/118ML RE ENEM: 1.0000 | ENEMA | Freq: Once | RECTAL | Status: DC | PRN

## 2016-03-01 MED ORDER — CEFAZOLIN SODIUM-DEXTROSE 2-4 GM/100ML-% IV SOLN
INTRAVENOUS | Status: AC
  Filled 2016-03-01: qty 100

## 2016-03-01 SURGICAL SUPPLY — 59 items
AUTOTRANSFUS HAS 1/8 (MISCELLANEOUS) ×2
BATTERY INSTRU NAVIGATION (MISCELLANEOUS) ×8 IMPLANT
BLADE SAW 1 (BLADE) ×2 IMPLANT
BLADE SAW 1/2 (BLADE) ×2 IMPLANT
CANISTER SUCT 1200ML W/VALVE (MISCELLANEOUS) ×2 IMPLANT
CANISTER SUCT 3000ML (MISCELLANEOUS) ×4 IMPLANT
CAPT KNEE TOTAL 3 ATTUNE ×2 IMPLANT
CATH TRAY METER 16FR LF (MISCELLANEOUS) ×2 IMPLANT
CEMENT HV SMART SET (Cement) ×4 IMPLANT
COOLER POLAR GLACIER W/PUMP (MISCELLANEOUS) ×2 IMPLANT
CUFF TOURN 24 STER (MISCELLANEOUS) ×2 IMPLANT
CUFF TOURN 30 STER DUAL PORT (MISCELLANEOUS) IMPLANT
DECANTER SPIKE VIAL GLASS SM (MISCELLANEOUS) ×4 IMPLANT
DRAPE SHEET LG 3/4 BI-LAMINATE (DRAPES) ×2 IMPLANT
DRSG DERMACEA 8X12 NADH (GAUZE/BANDAGES/DRESSINGS) ×2 IMPLANT
DRSG OPSITE POSTOP 4X14 (GAUZE/BANDAGES/DRESSINGS) ×2 IMPLANT
DRSG TEGADERM 4X4.75 (GAUZE/BANDAGES/DRESSINGS) ×2 IMPLANT
DURAPREP 26ML APPLICATOR (WOUND CARE) ×4 IMPLANT
ELECT CAUTERY BLADE 6.4 (BLADE) ×2 IMPLANT
ELECT REM PT RETURN 9FT ADLT (ELECTROSURGICAL) ×2
ELECTRODE REM PT RTRN 9FT ADLT (ELECTROSURGICAL) ×1 IMPLANT
EX-PIN ORTHOLOCK NAV 4X150 (PIN) ×4 IMPLANT
GLOVE BIOGEL M STRL SZ7.5 (GLOVE) ×4 IMPLANT
GLOVE INDICATOR 8.0 STRL GRN (GLOVE) ×2 IMPLANT
GLOVE SURG 9.0 ORTHO LTXF (GLOVE) ×2 IMPLANT
GLOVE SURG ORTHO 9.0 STRL STRW (GLOVE) ×2 IMPLANT
GOWN STRL REUS W/ TWL LRG LVL3 (GOWN DISPOSABLE) ×2 IMPLANT
GOWN STRL REUS W/TWL 2XL LVL3 (GOWN DISPOSABLE) ×2 IMPLANT
GOWN STRL REUS W/TWL LRG LVL3 (GOWN DISPOSABLE) ×2
HANDPIECE SUCTION TUBG SURGILV (MISCELLANEOUS) ×2 IMPLANT
HOLDER FOLEY CATH W/STRAP (MISCELLANEOUS) ×2 IMPLANT
HOOD PEEL AWAY FLYTE STAYCOOL (MISCELLANEOUS) ×4 IMPLANT
KIT RM TURNOVER STRD PROC AR (KITS) ×2 IMPLANT
KNIFE SCULPS 14X20 (INSTRUMENTS) ×2 IMPLANT
NDL SAFETY 18GX1.5 (NEEDLE) ×2 IMPLANT
NEEDLE SPNL 20GX3.5 QUINCKE YW (NEEDLE) ×2 IMPLANT
NS IRRIG 500ML POUR BTL (IV SOLUTION) ×2 IMPLANT
PACK TOTAL KNEE (MISCELLANEOUS) ×2 IMPLANT
PAD WRAPON POLAR KNEE (MISCELLANEOUS) ×1 IMPLANT
PIN DRILL QUICK PACK ×2 IMPLANT
PIN FIXATION 1/8DIA X 3INL (PIN) ×2 IMPLANT
SOL .9 NS 3000ML IRR  AL (IV SOLUTION) ×1
SOL .9 NS 3000ML IRR UROMATIC (IV SOLUTION) ×1 IMPLANT
SOL PREP PVP 2OZ (MISCELLANEOUS) ×2
SOLUTION PREP PVP 2OZ (MISCELLANEOUS) ×1 IMPLANT
SPONGE DRAIN TRACH 4X4 STRL 2S (GAUZE/BANDAGES/DRESSINGS) ×2 IMPLANT
STAPLER SKIN PROX 35W (STAPLE) ×2 IMPLANT
SUCTION FRAZIER HANDLE 10FR (MISCELLANEOUS) ×1
SUCTION TUBE FRAZIER 10FR DISP (MISCELLANEOUS) ×1 IMPLANT
SUT VIC AB 0 CT1 36 (SUTURE) ×2 IMPLANT
SUT VIC AB 1 CT1 36 (SUTURE) ×4 IMPLANT
SUT VIC AB 2-0 CT2 27 (SUTURE) ×2 IMPLANT
SYR 20CC LL (SYRINGE) ×2 IMPLANT
SYR 30ML LL (SYRINGE) ×2 IMPLANT
SYR 50ML LL SCALE MARK (SYRINGE) ×2 IMPLANT
SYSTEM AUTOTRANSFUS DUAL TROCR (MISCELLANEOUS) ×1 IMPLANT
TOWEL OR 17X26 4PK STRL BLUE (TOWEL DISPOSABLE) ×2 IMPLANT
TOWER CARTRIDGE SMART MIX (DISPOSABLE) ×2 IMPLANT
WRAPON POLAR PAD KNEE (MISCELLANEOUS) ×2

## 2016-03-01 NOTE — Brief Op Note (Signed)
03/01/2016  3:11 PM  PATIENT:  [name] [name]  75 y.o. female  PRE-OPERATIVE DIAGNOSIS:  PRIMARY OSTEOARTHRITIS of the right knee  POST-OPERATIVE DIAGNOSIS:  same  PROCEDURE:  Procedure(s): COMPUTER ASSISTED TOTAL KNEE ARTHROPLASTY (Right)  SURGEON:  Surgeon(s) and Role:    * Dereck Leep, MD - Primary  ASSISTANTS: Vance Peper, PA   ANESTHESIA:   spinal  EBL:  Total I/O In: 1200 [I.V.:1200] Out: 550 [Urine:500; Blood:50]  BLOOD ADMINISTERED:none  DRAINS: 2 medium drains to a reinfusion system   LOCAL MEDICATIONS USED:  MARCAINE    and OTHER Exparel  SPECIMEN:  No Specimen  DISPOSITION OF SPECIMEN:  N/A  COUNTS:  YES  TOURNIQUET:   99 minutes  DICTATION: .Dragon Dictation  PLAN OF CARE: Admit to inpatient   PATIENT DISPOSITION:  PACU - hemodynamically stable.   Delay start of Pharmacological VTE agent (>24hrs) due to surgical blood loss or risk of bleeding: yes

## 2016-03-01 NOTE — Anesthesia Procedure Notes (Signed)
Spinal Patient location during procedure: OR Start time: 03/01/2016 11:27 AM End time: 03/01/2016 11:31 AM Staffing Anesthesiologist: Martha Clan Resident/CRNA: Doreen Salvage Performed by: resident/CRNA  Preanesthetic Checklist Completed: patient identified, site marked, surgical consent, pre-op evaluation, timeout performed, IV checked, risks and benefits discussed and monitors and equipment checked Spinal Block Patient position: sitting Prep: ChloraPrep Patient monitoring: heart rate, continuous pulse ox, blood pressure and cardiac monitor Approach: midline Location: L4-5 Injection technique: single-shot Needle Needle type: Whitacre and Introducer  Needle gauge: 24 G Needle length: 9 cm Additional Notes Negative paresthesia. Negative blood return. Positive free-flowing CSF. Expiration date of kit checked and confirmed. Patient tolerated procedure well, without complications.

## 2016-03-01 NOTE — NC FL2 (Signed)
[place] LEVEL OF CARE SCREENING TOOL     IDENTIFICATION  Patient Name: [name] [name] Birthdate: Mar 15, 1941 Sex: female Admission Date (Current Location): 03/01/2016  [place] and [place] Number:  Engineering geologist and Address:  [facility], [address] [place], [place] [address]      Provider Number: [id number]  Attending Physician Name and Address:  Dereck Leep, MD  Relative Name and Phone Number:       Current Level of Care: Hospital Recommended Level of Care: [place] Prior Approval Number:    Date Approved/Denied:   PASRR Number:  ([id number])  Discharge Plan: SNF    Current Diagnoses: Patient Active Problem List   Diagnosis Date Noted  . S/P total knee arthroplasty 03/01/2016  . Knee osteoarthritis 02/16/2016  . Weakness 09/27/2015  . 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 Arthritis of knee, degenerative 05/26/2014  . History of CVA (cerebrovascular accident) 02/16/2014  . Seizures ([place]) 02/16/2014  . Chronic leg pain 02/16/2014  . Fatigue 07/02/2013  . HYPERTENSION, BENIGN 12/07/2010  . ATRIAL FIBRILLATION 06/29/2009  . SHORTNESS OF BREATH 06/29/2009    Orientation RESPIRATION BLADDER Height & Weight     Self, Time, Place, Situation  Normal Continent Weight: 174 lb 1.6 oz (78.971 kg) Height:  5\' 1"  (154.9 cm)  BEHAVIORAL SYMPTOMS/MOOD NEUROLOGICAL BOWEL NUTRITION STATUS   (none )  (none ) Continent Diet (Regular Diet )  AMBULATORY STATUS COMMUNICATION OF NEEDS Skin   Extensive Assist Verbally Surgical wounds (Incision: Right Knee )                       Personal Care Assistance Level of Assistance  Bathing, Feeding, Dressing Bathing Assistance: Limited assistance Feeding assistance: Independent Dressing Assistance: Limited assistance     Functional Limitations Info  Sight, Hearing, Speech Sight Info: Adequate Hearing Info: Adequate Speech Info:  Adequate    SPECIAL CARE FACTORS FREQUENCY  PT (By licensed PT), OT (By licensed OT)     PT Frequency:  (5) OT Frequency:  (5)            Contractures      Additional Factors Info  Code Status, Allergies Code Status Info:  (Not on File ) Allergies Info:  (Aspirin )           Current Medications (03/01/2016):  This is the current hospital active medication list Current Facility-Administered Medications  Medication Dose Route Frequency Provider Last Rate Last Dose  . 0.9 %  sodium chloride infusion   Intravenous Continuous Dereck Leep, MD 100 mL/hr at 03/01/16 1702    . acetaminophen (OFIRMEV) IV 1,000 mg  1,000 mg Intravenous Q6H Dereck Leep, MD      . acetaminophen (TYLENOL) tablet 650 mg  650 mg Oral Q6H PRN Dereck Leep, MD       Or  . acetaminophen (TYLENOL) suppository 650 mg  650 mg Rectal Q6H PRN Dereck Leep, MD      . alum & mag hydroxide-simeth (MAALOX/MYLANTA) 200-200-20 MG/5ML suspension 30 mL  30 mL Oral Q4H PRN Dereck Leep, MD      . bisacodyl (DULCOLAX) suppository 10 mg  10 mg Rectal Daily PRN Dereck Leep, MD      . calcium-vitamin D (OSCAL WITH D) 500-200 MG-UNIT per tablet 1 tablet  1 tablet Oral Daily Dereck Leep, MD   1 tablet at 03/01/16 1630  .  ceFAZolin (ANCEF) 2-4 GM/100ML-% IVPB           . ceFAZolin (ANCEF) IVPB 2g/100 mL premix  2 g Intravenous Q6H Dereck Leep, MD      . cholecalciferol (VITAMIN D) tablet 2,000 Units  2,000 Units Oral Daily Dereck Leep, MD   2,000 Units at 03/01/16 1630  . diltiazem (CARDIZEM CD) 24 hr capsule 120 mg  120 mg Oral Daily Dereck Leep, MD   120 mg at 03/01/16 1630  . diphenhydrAMINE (BENADRYL) 12.5 MG/5ML elixir 12.5-25 mg  12.5-25 mg Oral Q4H PRN Dereck Leep, MD      . Derrill Memo ON 03/02/2016] donepezil (ARICEPT) tablet 5 mg  5 mg Oral q morning - 10a Dereck Leep, MD      . fentaNYL (SUBLIMAZE) 100 MCG/2ML injection           . fentaNYL (SUBLIMAZE) 100 MCG/2ML injection           .  ferrous sulfate tablet 325 mg  325 mg Oral BID WC Dereck Leep, MD      . gabapentin (NEURONTIN) capsule 100 mg  100 mg Oral BID Dereck Leep, MD      . lactated ringers infusion   Intravenous Continuous Martha Clan, MD 50 mL/hr at 03/01/16 1054    . magnesium hydroxide (MILK OF MAGNESIA) suspension 30 mL  30 mL Oral Daily PRN Dereck Leep, MD      . magnesium oxide (MAG-OX) tablet 400 mg  400 mg Oral Daily Dereck Leep, MD   400 mg at 03/01/16 1645  . menthol-cetylpyridinium (CEPACOL) lozenge 3 mg  1 lozenge Oral PRN Dereck Leep, MD       Or  . phenol (CHLORASEPTIC) mouth spray 1 spray  1 spray Mouth/Throat PRN Dereck Leep, MD      . metoCLOPramide (REGLAN) tablet 10 mg  10 mg Oral TID AC & HS Dereck Leep, MD      . metoprolol tartrate (LOPRESSOR) tablet 25 mg  25 mg Oral BID Dereck Leep, MD      . morphine 2 MG/ML injection 2 mg  2 mg Intravenous Q2H PRN Dereck Leep, MD   2 mg at 03/01/16 1625  . multivitamin (RENA-VIT) tablet 1 tablet  1 tablet Oral Daily Dereck Leep, MD   1 tablet at 03/01/16 1630  . naphazoline-glycerin (CLEAR EYES) ophth solution 1 drop  1 drop Both Eyes Daily PRN Dereck Leep, MD      . omega-3 acid ethyl esters (LOVAZA) capsule 1,000 mg  1,000 mg Oral Daily Dereck Leep, MD   1,000 mg at 03/01/16 1630  . ondansetron (ZOFRAN) tablet 4 mg  4 mg Oral Q6H PRN Dereck Leep, MD       Or  . ondansetron (ZOFRAN) injection 4 mg  4 mg Intravenous Q6H PRN Dereck Leep, MD      . oxyCODONE (Oxy IR/ROXICODONE) immediate release tablet 5-10 mg  5-10 mg Oral Q4H PRN Dereck Leep, MD   10 mg at 03/01/16 1700  . pantoprazole (PROTONIX) EC tablet 40 mg  40 mg Oral BID Dereck Leep, MD      . potassium chloride (K-DUR,KLOR-CON) CR tablet 10 mEq  10 mEq Oral QHS Dereck Leep, MD      . Derrill Memo ON 03/02/2016] rivaroxaban (XARELTO) tablet 20 mg  20 mg Oral Q supper Dereck Leep, MD      .  senna-docusate (Senokot-S) tablet 1 tablet  1 tablet Oral BID  Dereck Leep, MD      . sodium phosphate (FLEET) 7-19 GM/118ML enema 1 enema  1 enema Rectal Once PRN Dereck Leep, MD      . traMADol Veatrice Bourbon) tablet 50-100 mg  50-100 mg Oral Q4H PRN Dereck Leep, MD      . vitamin B-12 (CYANOCOBALAMIN) tablet 100 mcg  100 mcg Oral Daily Dereck Leep, MD   100 mcg at 03/01/16 1630     Discharge Medications: Please see discharge summary for a list of discharge medications.  Relevant Imaging Results:  Relevant Lab Results:   Additional Information  (SSN: [id number])  Loralyn Freshwater, LCSW
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
## 2016-03-01 NOTE — Op Note (Signed)
OPERATIVE NOTE  DATE OF SURGERY:  03/01/2016  PATIENT NAME:  [name] [name]   DOB: Jul 05, 1941  MRN: [id number]  PRE-OPERATIVE DIAGNOSIS: Degenerative arthrosis of the right knee, primary  POST-OPERATIVE DIAGNOSIS:  Same  PROCEDURE:  Right total knee arthroplasty using computer-assisted navigation  SURGEON:  Marciano Sequin. M.D.  ASSISTANT:  Vance Peper, PA (present and scrubbed throughout the case, critical for assistance with exposure, retraction, instrumentation, and closure)  ANESTHESIA: spinal  ESTIMATED BLOOD LOSS: 50 mL  FLUIDS REPLACED: 1200 mL of crystalloid  TOURNIQUET TIME: 99 minutes  DRAINS: 2 medium drains to a reinfusion system  SOFT TISSUE RELEASES: Anterior cruciate ligament, posterior cruciate ligament, deep medial collateral ligament, patellofemoral ligament   IMPLANTS UTILIZED: DePuy Attune size 6 posterior stabilized femoral component (cemented), size 5 rotating platform tibial component (cemented), 38 mm medialized dome patella (cemented), and a 10 mm stabilized rotating platform polyethylene insert.  INDICATIONS FOR SURGERY: [name] [name] is a 75 y.o. year old female with a long history of progressive knee pain. X-rays demonstrated severe degenerative changes in tricompartmental fashion. The patient had not seen any significant improvement despite conservative nonsurgical intervention. After discussion of the risks and benefits of surgical intervention, the patient expressed understanding of the risks benefits and agree with plans for total knee arthroplasty.   The risks, benefits, and alternatives were discussed at length including but not limited to the risks of infection, bleeding, nerve injury, stiffness, blood clots, the need for revision surgery, cardiopulmonary complications, among others, and they were willing to proceed.  PROCEDURE IN DETAIL: The patient was brought into the operating room and, after adequate spinal anesthesia was achieved,  a tourniquet was placed on the patient's upper thigh. The patient's knee and leg were cleaned and prepped with alcohol and DuraPrep and draped in the usual sterile fashion. A "timeout" was performed as per usual protocol. The lower extremity was exsanguinated using an Esmarch, and the tourniquet was inflated to 300 mmHg. An anterior longitudinal incision was made followed by a standard mid vastus approach. The deep fibers of the medial collateral ligament were elevated in a subperiosteal fashion off of the medial flare of the tibia so as to maintain a continuous soft tissue sleeve. The patella was subluxed laterally and the patellofemoral ligament was incised. Inspection of the knee demonstrated severe degenerative changes with full-thickness loss of articular cartilage. Osteophytes were debrided using a rongeur. Anterior and posterior cruciate ligaments were excised. Two 4.0 mm Schanz pins were inserted in the femur and into the tibia for attachment of the array of trackers used for computer-assisted navigation. Hip center was identified using a circumduction technique. Distal landmarks were mapped using the computer. The distal femur and proximal tibia were mapped using the computer. The distal femoral cutting guide was positioned using computer-assisted navigation so as to achieve a 5 distal valgus cut. The femur was sized and it was felt that a size 6 femoral component was appropriate. A size 6 femoral cutting guide was positioned and the anterior cut was performed and verified using the computer. This was followed by completion of the posterior and chamfer cuts. Femoral cutting guide for the central box was then positioned in the center box cut was performed.  Attention was then directed to the proximal tibia. Medial and lateral menisci were excised. The extramedullary tibial cutting guide was positioned using computer-assisted navigation so as to achieve a 0 varus-valgus alignment and 3 posterior slope.  The cut was performed and verified using the  computer. The proximal tibia was sized and it was felt that a size 5 tibial tray was appropriate. Tibial and femoral trials were inserted followed by insertion of a 10 mm polyethylene insert. This allowed for excellent mediolateral soft tissue balancing both in flexion and in full extension. Finally, the patella was cut and prepared so as to accommodate a 38 mm medialized dome patella. A patella trial was placed and the knee was placed through a range of motion with excellent patellar tracking appreciated. The femoral trial was removed after debridement of posterior osteophytes. The central post-hole for the tibial component was reamed followed by insertion of a keel punch. Tibial trials were then removed. Cut surfaces of bone were irrigated with copious amounts of normal saline with antibiotic solution using pulsatile lavage and then suctioned dry. Polymethylmethacrylate cement was prepared in the usual fashion using a vacuum mixer. Cement was applied to the cut surface of the proximal tibia as well as along the undersurface of a size 5 rotating platform tibial component. Tibial component was positioned and impacted into place. Excess cement was removed using [employer] fast streamer. Cement was then applied to the cut surfaces of the femur as well as along the posterior flanges of the size 6 femoral component. The femoral component was positioned and impacted into place. Excess cement was removed using [employer] fast streamer. A 10 mm polyethylene trial was inserted and the knee was brought into full extension with steady axial compression applied. Finally, cement was applied to the backside of a 38 mm medialized dome patella and the patellar component was positioned and patellar clamp applied. Excess cement was removed using [employer] fast streamer. After adequate curing of the cement, the tourniquet was deflated after a total tourniquet time of 99 minutes. Hemostasis was achieved using  electrocautery. The knee was irrigated with copious amounts of normal saline with antibiotic solution using pulsatile lavage and then suctioned dry. 20 mL of 1.3% Exparel in 40 mL of normal saline was injected along the posterior capsule, medial and lateral gutters, and along the arthrotomy site. A 10 mm stabilized rotating platform polyethylene insert was inserted and the knee was placed through a range of motion with excellent mediolateral soft tissue balancing appreciated and excellent patellar tracking noted. 2 medium drains were placed in the wound bed and brought out through separate stab incisions to be attached to a reinfusion system. The medial parapatellar portion of the incision was reapproximated using interrupted sutures of #1 Vicryl. Subcutaneous tissue was then injected with a total of 30 cc of 0.25% Marcaine with epinephrine. Subcutaneous tissue was approximated in layers using first #0 Vicryl followed #2-0 Vicryl. The skin was approximated with skin staples. A sterile dressing was applied.  The patient tolerated the procedure well and was transported to the recovery room in stable condition.    James P. Holley Bouche., M.D.

## 2016-03-01 NOTE — Transfer of Care (Signed)
Immediate Anesthesia Transfer of Care Note  Patient: [name] [name]  Procedure(s) Performed: Procedure(s): COMPUTER ASSISTED TOTAL KNEE ARTHROPLASTY (Right)  Patient Location: PACU  Anesthesia Type:Spinal  Level of Consciousness: awake and alert   Airway & Oxygen Therapy: Patient Spontanous Breathing  Post-op Assessment: Report given to RN  Post vital signs: Reviewed and stable  Last Vitals:  Filed Vitals:   03/01/16 1011 03/01/16 1509  BP: 138/79 137/78  Pulse: 82 74  Temp: 36.6 C 36.3 C  Resp: 16 16    Last Pain:  Filed Vitals:   03/01/16 1510  PainSc: 7          Complications: No apparent anesthesia complications

## 2016-03-01 NOTE — Progress Notes (Signed)
PHARMACIST - PHYSICIAN ORDER COMMUNICATION  CONCERNING: P&T Medication Policy on Herbal Medications  DESCRIPTION:  This patient's order for:  Co Q-10,  Biotin has been noted.  This product(s) is classified as an "herbal" or natural product. Due to a lack of definitive safety studies or FDA approval, nonstandard manufacturing practices, plus the potential risk of unknown drug-drug interactions while on inpatient medications, the Pharmacy and Therapeutics Committee does not permit the use of "herbal" or natural products of this type within [facility].   ACTION TAKEN: The pharmacy department is unable to verify this order at this time and your patient has been informed of this safety policy. Please reevaluate patient's clinical condition at discharge and address if the herbal or natural product(s) should be resumed at that time.  

## 2016-03-01 NOTE — H&P (Signed)
The patient has been re-examined, and the chart reviewed, and there have been no interval changes to the documented history and physical.    The risks, benefits, and alternatives have been discussed at length. The patient expressed understanding of the risks benefits and agreed with plans for surgical intervention.  James P. Hooten, Jr. M.D.    

## 2016-03-02 ENCOUNTER — Encounter: Payer: Self-pay | Admitting: Orthopedic Surgery

## 2016-03-02 LAB — CBC
HCT: 36.5 % (ref 35.0–47.0)
Hemoglobin: 12.5 g/dL (ref 12.0–16.0)
MCH: 33.2 pg (ref 26.0–34.0)
MCHC: 34.3 g/dL (ref 32.0–36.0)
MCV: 96.8 fL (ref 80.0–100.0)
Platelets: 115 10*3/uL — ABNORMAL LOW (ref 150–440)
RBC: 3.77 MIL/uL — ABNORMAL LOW (ref 3.80–5.20)
RDW: 13.1 % (ref 11.5–14.5)
WBC: 6.9 10*3/uL (ref 3.6–11.0)

## 2016-03-02 LAB — BASIC METABOLIC PANEL
Anion gap: 9 (ref 5–15)
BUN: 14 mg/dL (ref 6–20)
CO2: 23 mmol/L (ref 22–32)
Calcium: 9 mg/dL (ref 8.9–10.3)
Chloride: 107 mmol/L (ref 101–111)
Creatinine, Ser: 0.58 mg/dL (ref 0.44–1.00)
GFR calc Af Amer: >60 mL/min (ref >60)
GFR calc non Af Amer: >60 mL/min (ref >60)
Glucose, Bld: 92 mg/dL (ref 65–99)
Potassium: 4 mmol/L (ref 3.5–5.1)
Sodium: 139 mmol/L (ref 135–145)

## 2016-03-02 MED ORDER — OXYCODONE HCL 5 MG PO TABS: 5.0000 mg | ORAL_TABLET | ORAL | Status: DC | PRN

## 2016-03-02 MED ORDER — TRAMADOL HCL 50 MG PO TABS: 50.0000 mg | ORAL_TABLET | ORAL | Status: DC | PRN

## 2016-03-02 NOTE — Evaluation (Signed)
Occupational Therapy Evaluation Patient Details Name: [name] [name] MRN: [id number] DOB: 22-Feb-1941 Today's Date: 03/02/2016    History of Present Illness Pt. is a 75 y.o. female who was admitted yo [facility] for an elective Right TKR.   Clinical Impression   Pt. Is a 75 y.o. female who was admitted for a Right TKR. Pt presents with limited ROM, Pain, weakness, and impaired functional mobility which hinder her ability to complete ADL and IADL tasks. Pt. could benefit from skilled OT services to review A/E use for LE ADLs, to review necessary home modifications, and to improve functional mobility for ADL/IADLs in order to work towards regaining Independence with ADL/IADLs.     Follow Up Recommendations  Home health OT    Equipment Recommendations  3 in 1 bedside comode;Tub/shower bench    Recommendations for Other Services Rehab consult     Precautions / Restrictions Precautions Precautions: None Restrictions Weight Bearing Restrictions: Yes RLE Weight Bearing: Weight bearing as tolerated      Mobility Bed Mobility Overal bed mobility: Needs Assistance Bed Mobility: Supine to Sit;Sit to Supine     Supine to sit: Min assist Sit to supine: Min assist (With trapeze bar)      Transfers Overall transfer level: Needs assistance Equipment used: Rolling walker (2 wheeled) Transfers: Stand Pivot Transfers   Stand pivot transfers: Min assist with increased time            Balance                                            ADL Overall ADL's : Needs assistance/impaired Eating/Feeding: Independent   Grooming: Set up           Upper Body Dressing : Independent   Lower Body Dressing: Moderate assistance      Toileting transfers: [place] and Hygiene: Moderate assistance               Vision     Perception     Praxis      Pertinent Vitals/Pain Pain Assessment: 0-10 Pain Score: 8  Pain Location:  Right Knee     Hand Dominance Right   Extremity/Trunk Assessment             Communication     Cognition                           General Comments   OT Treatment: Pt. Education was provided about A/E use for LE dressiing. Pt. was able to demonstrate proper A/E use with minA, and verbal cues.    Exercises       Shoulder Instructions      Home Living Family/patient expects to be discharged to:: Private residence   Available Help at Discharge: Family Type of Home: Apartment Home Access: Stairs to enter     Home Layout: One level     Bathroom Shower/Tub: Occupational psychologist: Standard Bathroom Accessibility: Yes   Home Equipment: Environmental consultant - 4 wheels          Prior Functioning/Environment Level of Independence: Needs assistance    ADL's / Homemaking Assistance Needed: Pt. goes to a common dining are for 1 meal a day. Pt. is indepndently able to fix the other meals. Pt. does not drive.  OT Diagnosis: Generalized weakness;Acute pain   OT Problem List: Decreased strength;Decreased range of motion;Decreased activity tolerance;Pain;Decreased knowledge of use of DME or AE   OT Treatment/Interventions: Self-care/ADL training;Therapeutic activities;Therapeutic exercise;DME and/or AE instruction;Balance training;Patient/family education;Energy conservation    OT Goals(Current goals can be found in the care plan section) Acute Rehab OT Goals Patient Stated Goal: To return home, regain Independence OT Goal Formulation: With patient Time For Goal Achievement: 03/16/16 Potential to Achieve Goals: Good  OT Frequency: Min 2X/week   Barriers to D/C:            Co-evaluation              End of Session    Activity Tolerance: Patient tolerated treatment well;Patient limited by pain Patient left: in bed;with call bell/phone within reach;with bed alarm set   Time: [id number] OT Time Calculation (min): 31 min Charges:  OT General  Charges $OT Visit: 1 Procedure OT Evaluation $OT Eval Moderate Complexity: 1 Procedure OT Treatments $Self Care/Home Management : 8-22 mins G-Codes:    Harrel Carina, MS, OTR/L Harrel Carina 03/02/2016, 10:37 AM

## 2016-03-02 NOTE — Discharge Instructions (Signed)

## 2016-03-02 NOTE — Anesthesia Postprocedure Evaluation (Signed)
Anesthesia Post Note  Patient: [name] [name]  Procedure(s) Performed: Procedure(s) (LRB): COMPUTER ASSISTED TOTAL KNEE ARTHROPLASTY (Right)  Patient location during evaluation: Nursing Unit Anesthesia Type: Spinal Level of consciousness: awake and alert Pain management: satisfactory to patient Vital Signs Assessment: post-procedure vital signs reviewed and stable Respiratory status: spontaneous breathing Cardiovascular status: stable Postop Assessment: no headache Anesthetic complications: no    Last Vitals:  Filed Vitals:   03/02/16 0327 03/02/16 0742  BP: 124/58 130/79  Pulse: 87 110  Temp: 37 C 36.8 C  Resp: 16 18    Last Pain:  Filed Vitals:   03/02/16 1252  PainSc: 0-No pain                 Rolla Plate P

## 2016-03-02 NOTE — Progress Notes (Signed)
   Subjective: 1 Day Post-Op Procedure(s) (LRB): COMPUTER ASSISTED TOTAL KNEE ARTHROPLASTY (Right) Patient reports pain as mild.   Patient is well, and has had no acute complaints or problems We will start therapy today.  Plan is to go Rehab after hospital stay. no nausea and no vomiting Patient denies any chest pains or shortness of breath. Objective: Vital signs in last 24 hours: Temp:  [97.3 F (36.3 C)-99.1 F (37.3 C)] 98.6 F (37 C) (05/11 0327) Pulse Rate:  [63-119] 87 (05/11 0327) Resp:  [12-18] 16 (05/11 0327) BP: (110-147)/(53-85) 124/58 mmHg (05/11 0327) SpO2:  [95 %-100 %] 95 % (05/11 0327) Weight:  [72.122 kg (159 lb)-78.971 kg (174 lb 1.6 oz)] 78.971 kg (174 lb 1.6 oz) (05/10 1616) Heels are non tender and elevated off the bed using rolled towels Intake/Output from previous day: 05/10 0701 - 05/11 0700 In: 3797.5 [P.O.:170; I.V.:2475; IV Piggyback:400] Out: 3070 [Urine:2000; Drains:1020; Blood:50] Intake/Output this shift:     Recent Labs  03/02/16 0350  HGB 12.5    Recent Labs  03/02/16 0350  WBC 6.9  RBC 3.77*  HCT 36.5  PLT 115*    Recent Labs  03/02/16 0350  NA 139  K 4.0  CL 107  CO2 23  BUN 14  CREATININE 0.58  GLUCOSE 92  CALCIUM 9.0   No results for input(s): LABPT, INR in the last 72 hours.  EXAM General - Patient is Alert, Appropriate and Oriented Extremity - Neurologically intact Neurovascular intact Sensation intact distally Intact pulses distally Dorsiflexion/Plantar flexion intact Compartment soft Dressing - dressing C/D/I Motor Function - intact, moving foot and toes well on exam.    Past Medical History  Diagnosis Date  . Chronic a-fib ([place])   . DVT (deep venous thrombosis) ([place])     H/O  . Dysrhythmia     a fib  . Heart murmur   . Edema   . Hypertension     controlled  . Seizure ([place])     2 years ago knocked unconscious; hospitalized and diagnosed with seizures; no spells in last year;   . Arthritis    osteo; in B knees;   . Stroke ([place])     1 month ago (possible stroke)    Assessment/Plan: 1 Day Post-Op Procedure(s) (LRB): COMPUTER ASSISTED TOTAL KNEE ARTHROPLASTY (Right) Active Problems:   S/P total knee arthroplasty  Estimated body mass index is 32.91 kg/(m^2) as calculated from the following:   Height as of this encounter: 5\' 1"  (1.549 m).   Weight as of this encounter: 78.971 kg (174 lb 1.6 oz). Advance diet Up with therapy D/C IV fluids Plan for discharge tomorrow Discharge to SNF  Labs: were reviewed DVT Prophylaxis - Xarelto Weight-Bearing as tolerated to right leg D/C O2 and Pulse OX and try on Room Air Begin working on having a bowel movement Labs tomorrow morning  Keerthana Vanrossum R. Guffey Manvel 03/02/2016, 7:35 AM

## 2016-03-02 NOTE — Care Management Note (Signed)
Case Management Note  Patient Details  Name: [name] [name] MRN: [id number] Date of Birth: 04/19/1941  Subjective/Objective:                   Met with patient to discuss discharge planning. She plans to go to [facility] at discharge. PT is pending. She lives alone with a dog that is being cared for at her daughter's Veterinarian's office. She does not have a rolling walker at home to use. She has a rollator. She uses [facility] for Rx. She lives at [facility] however her health insurance (per patient) is not accepted at [facility] (the owner of [facility]).   Action/Plan: RNCM will continue to follow. List of home health agencies left with patient .Cost per speciality/visit is $25 for home health.   Expected Discharge Date:                  Expected Discharge Plan:     In-House Referral:     Discharge planning Services  CM Consult  Post Acute Care Choice:  Home Health, Durable Medical Equipment Choice offered to:  Patient  DME Arranged:    DME Agency:     HH Arranged:    HH Agency:     Status of Service:  In process, will continue to follow  Medicare Important Message Given:    Date Medicare IM Given:    Medicare IM give by:    Date Additional Medicare IM Given:    Additional Medicare Important Message give by:     If discussed at Long Length of Stay Meetings, dates discussed:    Additional Comments:   , RN 03/02/2016, 9:35 AM  

## 2016-03-02 NOTE — Progress Notes (Signed)
   03/02/16 1900  Clinical Encounter Type  Visited With Patient  Visit Type Follow-up  Referral From Chaplain  Consult/Referral To Chaplain  Patient has not completed paperwork and I made the suggestion that if she doesn't complete it before leaving that she could always have it completed at her bank or come back here and we could assist her.   [place] [phone]

## 2016-03-02 NOTE — Discharge Summary (Signed)
Physician Discharge Summary  Patient ID: [name] [name] MRN: [id number] DOB/AGE: 74/17/1942 75 y.o.  Admit date: 03/01/2016 Discharge date: 03/04/16  Admission Diagnoses:  PRIMARY OSTEOARTHRITIS   Discharge Diagnoses: Patient Active Problem List   Diagnosis Date Noted  . S/P total knee arthroplasty 03/01/2016  . Knee osteoarthritis 02/16/2016  . Weakness 09/27/2015  . 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 Arthritis of knee, degenerative 05/26/2014  . History of CVA (cerebrovascular accident) 02/16/2014  . Seizures ([place]) 02/16/2014  . Chronic leg pain 02/16/2014  . Fatigue 07/02/2013  . HYPERTENSION, BENIGN 12/07/2010  . ATRIAL FIBRILLATION 06/29/2009  . SHORTNESS OF BREATH 06/29/2009    Past Medical History  Diagnosis Date  . Chronic a-fib ([place])   . DVT (deep venous thrombosis) ([place])     H/O  . Dysrhythmia     a fib  . Heart murmur   . Edema   . Hypertension     controlled  . Seizure ([place])     2 years ago knocked unconscious; hospitalized and diagnosed with seizures; no spells in last year;   . Arthritis     osteo; in B knees;   . Stroke [facility])     1 month ago (possible stroke)     Transfusion: Autovac transfusions given the first 6 hours postoperatively   Consultants (if any):  case management for placement  Discharged Condition: Improved  Hospital Course: [name] [name] is an 75 y.o. female who was admitted 03/01/2016 with a diagnosis of degenerative arthrosis right knee and went to the operating room on 03/01/2016 and underwent the above named procedures.    Surgeries:Procedure(s): COMPUTER ASSISTED TOTAL KNEE ARTHROPLASTY on 03/01/2016  PRE-OPERATIVE DIAGNOSIS: Degenerative arthrosis of the right knee, primary  POST-OPERATIVE DIAGNOSIS: Same  PROCEDURE: Right total knee arthroplasty using computer-assisted navigation  SURGEON: Marciano Sequin. M.D.  ASSISTANT: Vance Peper, PA (present and scrubbed throughout the  case, critical for assistance with exposure, retraction, instrumentation, and closure)  ANESTHESIA: spinal  ESTIMATED BLOOD LOSS: 50 mL  FLUIDS REPLACED: 1200 mL of crystalloid  TOURNIQUET TIME: 99 minutes  DRAINS: 2 medium drains to a reinfusion system  SOFT TISSUE RELEASES: Anterior cruciate ligament, posterior cruciate ligament, deep medial collateral ligament, patellofemoral ligament   IMPLANTS UTILIZED: DePuy Attune size 6 posterior stabilized femoral component (cemented), size 5 rotating platform tibial component (cemented), 38 mm medialized dome patella (cemented), and a 10 mm stabilized rotating platform polyethylene insert.  INDICATIONS FOR SURGERY: [name] [name] is a 75 y.o. year old female with a long history of progressive knee pain. X-rays demonstrated severe degenerative changes in tricompartmental fashion. The patient had not seen any significant improvement despite conservative nonsurgical intervention. After discussion of the risks and benefits of surgical intervention, the patient expressed understanding of the risks benefits and agree with plans for total knee arthroplasty.   The risks, benefits, and alternatives were discussed at length including but not limited to the risks of infection, bleeding, nerve injury, stiffness, blood clots, the need for revision surgery, cardiopulmonary complications, among others, and they were willing to proceed. Patient tolerated the surgery well. No complications .Patient was taken to PACU where she was stabilized and then transferred to the orthopedic floor.  Patient started on Xarelto. Foot pumps applied bilaterally at 80 mm hg. Heels elevated off bed with rolled towels. No evidence of DVT. Calves non tender. Negative Homan. Physical therapy started on day #1 for gait training and transfer with OT starting on  day #1 for ADL and assisted devices. Patient has done well with therapy. Ambulated 10 feet upon being discharged.  Patient's  IV and Foley were discontinued on day 1 with the Hemovac being discontinued on day #2   She was given perioperative antibiotics:  Anti-infectives    Start     Dose/Rate Route Frequency Ordered Stop   03/01/16 1630  ceFAZolin (ANCEF) IVPB 2g/100 mL premix     2 g 200 mL/hr over 30 Minutes Intravenous Every 6 hours 03/01/16 1619 03/02/16 1629   03/01/16 0847  ceFAZolin (ANCEF) 2-4 GM/100ML-% IVPB    Comments:  Hallaji, Violet Ann: cabinet override      03/01/16 0847 03/01/16 2059   03/01/16 0208  ceFAZolin (ANCEF) IVPB 2g/100 mL premix  Status:  Discontinued     2 g 200 mL/hr over 30 Minutes Intravenous On call to O.R. 03/01/16 [id number] 03/01/16 1003    .  She was Fitted with AV 1 compression foot pump devices, instructed on heel pumps, early ambulation, and fitted with TED stockings bilaterally for DVT prophylaxis.  She benefited maximally from the hospital stay and there were no complications.    Recent vital signs:  Filed Vitals:   03/01/16 2205 03/02/16 0327  BP: 118/53 124/58  Pulse: 86 87  Temp: 98.2 F (36.8 C) 98.6 F (37 C)  Resp: 16 16    Recent laboratory studies:  Lab Results  Component Value Date   HGB 12.5 03/02/2016   HGB 15.0 02/16/2016   HGB 14.8 05/28/2015   Lab Results  Component Value Date   WBC 6.9 03/02/2016   PLT 115* 03/02/2016   Lab Results  Component Value Date   INR 1.79 02/16/2016   Lab Results  Component Value Date   NA 139 03/02/2016   K 4.0 03/02/2016   CL 107 03/02/2016   CO2 23 03/02/2016   BUN 14 03/02/2016   CREATININE 0.58 03/02/2016   GLUCOSE 92 03/02/2016    Discharge Medications:     Medication List    TAKE these medications        acetaminophen 500 MG tablet  Commonly known as:  TYLENOL  Take 1,000 mg by mouth every 6 (six) hours as needed.     b complex vitamins tablet  Take 1 tablet by mouth daily.     Biotin 5000 MCG Caps  Take 1 capsule by mouth daily.     CALCIUM-VITAMIN D PO  Take 800 mg by mouth  daily. 1000 iu vitamin d     Co Q-10 100 MG Caps  Take 100 mg by mouth 1 day or 1 dose.     diltiazem 120 MG 24 hr capsule  Commonly known as:  CARDIZEM CD  Take 1 capsule (120 mg total) by mouth 2 (two) times daily.     donepezil 5 MG tablet  Commonly known as:  ARICEPT  Take 5 mg by mouth every morning.     EYE DROPS RELIEF OP  Apply 1 Dose to eye daily as needed.     gabapentin 100 MG capsule  Commonly known as:  NEURONTIN  Take 1 capsule (100 mg total) by mouth 2 (two) times daily.     Krill Oil 300 MG Caps  Take 350 mg by mouth daily.     metoprolol tartrate 25 MG tablet  Commonly known as:  LOPRESSOR  Take 1 tablet (25 mg total) by mouth 2 (two) times daily.     oxyCODONE 5 MG immediate release  tablet  Commonly known as:  Oxy IR/ROXICODONE  Take 1-2 tablets (5-10 mg total) by mouth every 4 (four) hours as needed for severe pain or breakthrough pain.     oxyCODONE-acetaminophen 5-325 MG tablet  Commonly known as:  ROXICET  Take 1-2 tablets by mouth every 6 (six) hours as needed.     POTASSIUM & MAGNESIUM ASPARTAT PO  Take 1 tablet by mouth at bedtime.     PROBIOTIC FORMULA Caps  Take 1 capsule by mouth at bedtime.     rivaroxaban 20 MG Tabs tablet  Commonly known as:  XARELTO  Take 1 tablet (20 mg total) by mouth daily with supper.     traMADol 50 MG tablet  Commonly known as:  ULTRAM  Take 1-2 tablets (50-100 mg total) by mouth every 4 (four) hours as needed for moderate pain.     TURMERIC PO  Take 1 capsule by mouth 2 (two) times daily. Takes 800 mg in the morning and 500 mg in the evening     VITAMIN B 12 PO  Take 100 mg by mouth daily.     Vitamin D3 2000 units Tabs  Take 200 Units by mouth daily.        Diagnostic Studies: Dg Knee Right Port  03/01/2016  CLINICAL DATA:  Status post right knee replacement. EXAM: PORTABLE RIGHT KNEE - 1-2 VIEW COMPARISON:  August 04, 2015. FINDINGS: The femoral and tibial components appear to be well situated.  Surgical drains are present. No fracture or dislocation is noted. IMPRESSION: Status post right total knee arthroplasty. Electronically Signed   By: Marijo Conception, M.D.   On: 03/01/2016 15:41    Disposition: 01-Home or Self Care      Discharge Instructions    Diet - low sodium heart healthy    Complete by:  As directed      Increase activity slowly    Complete by:  As directed            Follow-up Information    Follow up with [facility] PA On 03/07/2016.   Specialty:  Physician Assistant   Why:  at 2:15pm   Contact information:   [place] [address] [phone]       Follow up with Dereck Leep, MD On 04/11/2016.   Specialty:  Orthopedic Surgery   Why:  at 1:30pm   Contact information:   [place] [address] [phone]        Signed: Watt Climes 03/02/2016, 7:43 AM

## 2016-03-02 NOTE — Evaluation (Signed)
Physical Therapy Evaluation Patient Details Name: [name] [name] MRN: [id number] DOB: [birth year] Today's Date: 03/02/2016   History of Present Illness  Pt is a 75 y.o. female who was admitted to [facility] for an elective Right TKR secondary to primary OA 03/01/16.  Clinical Impression  Prior to admission, pt was modified independent ambulating with rollator.  Pt lives at [place].  Currently pt is min assist with bed mobility, transfers, and ambulation 5 feet with RW (limited distance/activity d/t c/o significant R knee pain).  Pt able to perform x10 R LE SLR independently (although pt reporting pain with this activity).  Pt would benefit from skilled PT to address noted impairments and functional limitations.  Recommend pt discharge to STR when medically appropriate.     Follow Up Recommendations SNF    Equipment Recommendations  Rolling walker with 5" wheels    Recommendations for Other Services       Precautions / Restrictions Precautions Precautions: Fall;Knee Precaution Booklet Issued: Yes (comment) Restrictions Weight Bearing Restrictions: Yes RLE Weight Bearing: Weight bearing as tolerated      Mobility  Bed Mobility Overal bed mobility: Needs Assistance Bed Mobility: Supine to Sit     Supine to sit: Min assist   General bed mobility comments: assist for R LE; vc's for technique required  Transfers Overall transfer level: Needs assistance Equipment used: Rolling walker (2 wheeled) Transfers: Sit to/from [facility] Sit to Stand: Min assist Stand pivot transfers: Min assist (bed to bedside commode)       General transfer comment: vc's required for hand and feet placement; increased effort to stand  Ambulation/Gait Ambulation/Gait assistance: Min assist Ambulation Distance (Feet): 5 Feet Assistive device: Rolling walker (2 wheeled)   Gait velocity: decreased   General Gait Details: antalgic/decreased stance time R LE;  limited distance d/t c/o R knee pain; vc's required for gait technique and walker use  Stairs            Wheelchair Mobility    Modified Rankin (Stroke Patients Only)       Balance Overall balance assessment: Needs assistance Sitting-balance support: Bilateral upper extremity supported;Feet supported Sitting balance-Leahy Scale: Fair     Standing balance support: Bilateral upper extremity supported (on RW) Standing balance-Leahy Scale: Fair                               Pertinent Vitals/Pain Pain Assessment: 0-10 Pain Score: 8  Pain Location: R knee Pain Descriptors / Indicators: Sore;Tender;Operative site guarding;Aching Pain Intervention(s): Limited activity within patient's tolerance;Monitored during session;Premedicated before session;Repositioned;Ice applied  See flow sheet for HR and O2 vitals.    Home Living Family/patient expects to be discharged to:: Private residence Living Arrangements: Alone Available Help at Discharge: Family Type of Home: Independent living facility [facility]) Home Access: Level entry     Home Layout: One Gross: Environmental consultant - 4 wheels;Cane - single point;Shower seat - built in      Prior Function Level of Independence: Independent with assistive device(s)      Comments: Pt mostly utilizes 4ww with seat to ambulate with; denies any falls in past 6 months.  Does not drive.     Hand Dominance   Dominant Hand: Right    Extremity/Trunk Assessment   Upper Extremity Assessment: Overall WFL for tasks assessed           Lower Extremity Assessment: RLE deficits/detail;LLE deficits/detail RLE Deficits /  Details: R hip flexion at least 3/5; knee flexion/extension at least 2+/5 (limited d/t R knee pain); DF at least 4/5; able to perform x10 R LE SLR independently with increased effort LLE Deficits / Details: L LE strength and ROM WFL  Cervical / Trunk Assessment: Normal  Communication   Communication: No  difficulties  Cognition Arousal/Alertness: Awake/alert Behavior During Therapy: WFL for tasks assessed/performed Overall Cognitive Status: Within Functional Limits for tasks assessed                      General Comments General comments (skin integrity, edema, etc.): R knee dressings, polar care, and hemovac in place  Pt agreeable to PT session.    Exercises Total Joint Exercises Straight Leg Raises: Strengthening;10 reps;Supine;Right;AROM Goniometric ROM: R knee extension 14 degrees short of neutral semi-supine in bed; R knee flexion 65 degrees in sitting      Assessment/Plan    PT Assessment Patient needs continued PT services  PT Diagnosis Difficulty walking;Acute pain   PT Problem List Decreased strength;Decreased range of motion;Decreased activity tolerance;Decreased balance;Decreased mobility;Decreased knowledge of use of DME;Decreased knowledge of precautions;Pain  PT Treatment Interventions DME instruction;Gait training;Functional mobility training;Therapeutic activities;Therapeutic exercise;Balance training;Patient/family education   PT Goals (Current goals can be found in the Care Plan section) Acute Rehab PT Goals Patient Stated Goal: to have less pain PT Goal Formulation: With patient Time For Goal Achievement: 03/16/16 Potential to Achieve Goals: Good    Frequency BID   Barriers to discharge Decreased caregiver support      Co-evaluation               End of Session Equipment Utilized During Treatment: Gait belt Activity Tolerance: Patient limited by pain Patient left: in chair;with call bell/phone within reach;with chair alarm set;with SCD's reapplied (B heels elevated via towel rolls; polar care in place and activated) Nurse Communication: Mobility status;Precautions;Weight bearing status         Time: [id number] PT Time Calculation (min) (ACUTE ONLY): 27 min   Charges:   PT Evaluation $PT Eval Low Complexity: 1 Procedure PT  Treatments $Therapeutic Activity: 8-22 mins   PT G CodesLeitha Bleak 2016-03-13, 1:53 PM Leitha Bleak, [place]

## 2016-03-02 NOTE — Clinical Social Work Note (Signed)
Clinical Social Work Assessment  Patient Details  Name: [name] [name] MRN: [id number] Date of Birth: 09-21-1941  Date of referral:  03/02/16               Reason for consult:  Facility Placement                Permission sought to share information with:  Chartered certified accountant granted to share information::  Yes, Verbal Permission Granted  Name::      [place]::   [place]   Relationship::     Contact Information:     Housing/Transportation Living arrangements for the past 2 months:  [place] of Information:  Patient Patient Interpreter Needed:  None Criminal Activity/Legal Involvement Pertinent to Current Situation/Hospitalization:  No - Comment as needed Significant Relationships:  Adult Children Lives with:  Self Do you feel safe going back to the place where you live?  Yes Need for family participation in patient care:  Yes (Comment)  Care giving concerns:  Patient lives at [facility] independent living [facility] to [facility].    Social Worker assessment / plan:  Holiday representative (CSW) received SNF consult. PT is recommending SNF. CSW met with patient alone at bedside. Patient was alert and oriented and was sitting up in the chair. CSW introduced self and explained role of CSW department. Patient stated that she lives at [facility] independent living. CSW explained SNF process. Patient is agreeable to SNF search and prefers [facility]. CSW explained that patient's insurance Health Team requires pre-authorization for SNF. Patient verbalized her understanding. Per patient her daughter [name] is her primary support.   CSW presented bed offers. Patient chose [facility]. [facility] Health Team case manager is aware of above. CSW will continue to follow and assist as needed.   Employment status:  Retired Nurse, adult PT Recommendations:  [place] /  Referral to community resources:  [place]  Patient/Family's Response to care:  Patient is agreeable to go to [place].   Patient/Family's Understanding of and Emotional Response to Diagnosis, Current Treatment, and Prognosis:  Patient was pleasant and thanked CSW for visit.   Emotional Assessment Appearance:  Appears stated age Attitude/Demeanor/Rapport:    Affect (typically observed):  Accepting, Adaptable, Pleasant Orientation:  Oriented to Self, Oriented to Place, Oriented to  Time, Oriented to Situation Alcohol / Substance use:  Not Applicable Psych involvement (Current and /or in the community):  No (Comment)  Discharge Needs  Concerns to be addressed:  Discharge Planning Concerns Readmission within the last 30 days:  No Current discharge risk:  Dependent with Mobility Barriers to Discharge:  Continued Medical Work up   Loralyn Freshwater, LCSW 03/02/2016, 12:07 PM

## 2016-03-02 NOTE — Clinical Social Work Placement (Signed)
   CLINICAL SOCIAL WORK PLACEMENT  NOTE  Date:  03/02/2016  Patient Details  Name: [name] MRN: [id number] Date of Birth: 20-Mar-1941  Clinical Social Work is seeking post-discharge placement for this patient at the [place] level of care (*CSW will initial, date and re-position this form in  chart as items are completed):  Yes   Patient/family provided with [place] Work Department's list of facilities offering this level of care within the geographic area requested by the patient (or if unable, by the patient's family).  Yes   Patient/family informed of their freedom to choose among providers that offer the needed level of care, that participate in Medicare, Medicaid or managed care program needed by the patient, have an available bed and are willing to accept the patient.  Yes   Patient/family informed of [facility]'s ownership interest in [facility] and [facility], as well as of the fact that they are under no obligation to receive care at these facilities.  PASRR submitted to EDS on 03/02/16     PASRR number received on 03/02/16     Existing PASRR number confirmed on       FL2 transmitted to all facilities in geographic area requested by pt/family on 03/02/16     FL2 transmitted to all facilities within larger geographic area on       Patient informed that his/her managed care company has contracts with or will negotiate with certain facilities, including the following:        Yes   Patient/family informed of bed offers received.  Patient chooses bed at  [facility] )     Physician recommends and patient chooses bed at      Patient to be transferred to   on  .  Patient to be transferred to facility by       Patient family notified on   of transfer.  Name of family member notified:        PHYSICIAN       Additional Comment:    _______________________________________________ Loralyn Freshwater,  LCSW 03/02/2016, 12:06 PM

## 2016-03-02 NOTE — Plan of Care (Signed)
Problem: Pain Management: Goal: Pain level will decrease Outcome: Progressing Pain controlled with PRN medication. Resting between care no acute distress noted.

## 2016-03-02 NOTE — Progress Notes (Signed)
Physical Therapy Treatment Patient Details Name: AMBOR HRNCIR MRN: [id number] DOB: 10/28/1940 Today's Date: 03/02/2016    History of Present Illness Pt is a 75 y.o. female who was admitted to [facility] for an elective Right TKR secondary to primary OA 03/01/16.    PT Comments    Pt limited with ambulation distance 15 feet with RW min assist d/t R knee pain and fatigue.  Pt required increased assist with LE ex's semi-supine in bed d/t fatigue.  Will continue to progress pt with ambulation distance, R knee ROM, and strengthening next session per pt tolerance.   Follow Up Recommendations  SNF     Equipment Recommendations  Rolling walker with 5" wheels    Recommendations for Other Services       Precautions / Restrictions Precautions Precautions: Fall;Knee Precaution Booklet Issued: Yes (comment) Restrictions Weight Bearing Restrictions: Yes RLE Weight Bearing: Weight bearing as tolerated    Mobility  Bed Mobility Overal bed mobility: Needs Assistance Bed Mobility: Sit to Supine     Sit to supine: Min assist   General bed mobility comments: assist for R LE; vc's for technique required; 2 assist to scoot pt up in bed d/t fatigue  Transfers Overall transfer level: Needs assistance Equipment used: Rolling walker (2 wheeled) Transfers: Sit to/from [facility] Sit to Stand: Min assist Stand pivot transfers: Min assist (recliner to bedside commode)       General transfer comment: vc's required for hand and feet placement; increased effort to stand  Ambulation/Gait Ambulation/Gait assistance: Min assist Ambulation Distance (Feet): 15 Feet Assistive device: Rolling walker (2 wheeled)   Gait velocity: decreased   General Gait Details: antalgic/decreased stance time R LE; limited distance d/t c/o R knee pain and fatigue; vc's required for gait technique and walker use   Stairs            Wheelchair Mobility    Modified Rankin (Stroke  Patients Only)       Balance Overall balance assessment: Needs assistance Sitting-balance support: Bilateral upper extremity supported;Feet supported Sitting balance-Leahy Scale: Fair     Standing balance support: Bilateral upper extremity supported (on RW) Standing balance-Leahy Scale: Fair                      Cognition Arousal/Alertness: Awake/alert Behavior During Therapy: WFL for tasks assessed/performed Overall Cognitive Status: Within Functional Limits for tasks assessed                      Exercises Total Joint Exercises Ankle Circles/Pumps: AROM;Strengthening;Both;10 reps;Supine Quad Sets: AROM;Strengthening;Both;10 reps;Supine Short Arc Quad: Strengthening;Both;10 reps;Supine (AAROM R; AROM L) Heel Slides: Strengthening;Both;10 reps;Supine (AAROM R; AROM L) Hip ABduction/ADduction: Strengthening;Both;10 reps;Supine (AAROM R; AROM L) Straight Leg Raises: Strengthening;Both;10 reps;Supine (AAROM R; AROM L) Goniometric ROM: R knee extension 14 degrees short of neutral semi-supine in bed; R knee flexion 65 degrees in sitting (performed in AM session)    General Comments General comments (skin integrity, edema, etc.): R knee dressings, polar care, and hemovac in place  Pt agreeable to PT session.      Pertinent Vitals/Pain Pain Assessment: 0-10 Pain Score: 6  Pain Location: R knee Pain Descriptors / Indicators: Sore;Tender;Operative site guarding;Discomfort Pain Intervention(s): Limited activity within patient's tolerance;Monitored during session;Premedicated before session;Repositioned;Ice applied    Home Living Family/patient expects to be discharged to:: Private residence Living Arrangements: Alone   Type of Home: Independent living facility [facility]) Home Access: Level entry   Home  Layout: One level Home Equipment: Walker - 4 wheels;Cane - single point;Shower seat - built in      Prior Function Level of Independence: Independent with  assistive device(s)      Comments: Pt mostly utilizes 4ww with seat to ambulate with; denies any falls in past 6 months.  Does not drive.   PT Goals (current goals can now be found in the care plan section) Acute Rehab PT Goals Patient Stated Goal: to have less pain PT Goal Formulation: With patient Time For Goal Achievement: 03/16/16 Potential to Achieve Goals: Good Progress towards PT goals: PT to reassess next treatment (pt still with increased pain with activities)    Frequency  BID    PT Plan Current plan remains appropriate    Co-evaluation             End of Session Equipment Utilized During Treatment: Gait belt Activity Tolerance: Patient limited by pain;Patient limited by fatigue Patient left: in bed;with call bell/phone within reach;with bed alarm set;with family/visitor present;with SCD's reapplied (B heels elevated via towel rolls; polar care in place and activated)     Time: [id number] PT Time Calculation (min) (ACUTE ONLY): 38 min  Charges:  $Gait Training: 8-22 mins $Therapeutic Exercise: 8-22 mins $Therapeutic Activity: 8-22 mins                    G CodesLeitha Bleak 11-Mar-2016, 5:10 PM Leitha Bleak, [place]

## 2016-03-03 ENCOUNTER — Encounter
Admission: RE | Admit: 2016-03-03 | Discharge: 2016-03-03 | Disposition: A | Discharge status: Home / Self Care | Payer: PPO | Source: Ambulatory Visit | Attending: Internal Medicine | Admitting: Internal Medicine

## 2016-03-03 ENCOUNTER — Encounter: Admission: RE | Admit: 2016-03-03 | Payer: PPO | Source: Ambulatory Visit | Admitting: Internal Medicine

## 2016-03-03 DIAGNOSIS — R3915 Urgency of urination: Secondary | ICD-10-CM | POA: Insufficient documentation

## 2016-03-03 DIAGNOSIS — M545 Low back pain: Secondary | ICD-10-CM | POA: Insufficient documentation

## 2016-03-03 DIAGNOSIS — J029 Acute pharyngitis, unspecified: Secondary | ICD-10-CM | POA: Insufficient documentation

## 2016-03-03 LAB — CBC
HCT: 35.9 % (ref 35.0–47.0)
Hemoglobin: 12.2 g/dL (ref 12.0–16.0)
MCH: 32.9 pg (ref 26.0–34.0)
MCHC: 34 g/dL (ref 32.0–36.0)
MCV: 96.6 fL (ref 80.0–100.0)
Platelets: 102 10*3/uL — ABNORMAL LOW (ref 150–440)
RBC: 3.72 MIL/uL — ABNORMAL LOW (ref 3.80–5.20)
RDW: 12.8 % (ref 11.5–14.5)
WBC: 9.6 10*3/uL (ref 3.6–11.0)

## 2016-03-03 LAB — BASIC METABOLIC PANEL
Anion gap: 5 (ref 5–15)
BUN: 12 mg/dL (ref 6–20)
CO2: 27 mmol/L (ref 22–32)
Calcium: 9.1 mg/dL (ref 8.9–10.3)
Chloride: 105 mmol/L (ref 101–111)
Creatinine, Ser: 0.51 mg/dL (ref 0.44–1.00)
GFR calc Af Amer: >60 mL/min (ref >60)
GFR calc non Af Amer: >60 mL/min (ref >60)
Glucose, Bld: 113 mg/dL — ABNORMAL HIGH (ref 65–99)
Potassium: 3.7 mmol/L (ref 3.5–5.1)
Sodium: 137 mmol/L (ref 135–145)

## 2016-03-03 MED ORDER — LACTULOSE 10 GM/15ML PO SOLN
10.0000 g | Freq: Two times a day (BID) | ORAL | Status: DC | PRN
  Administered 2016-03-03: 10 g via ORAL
  Filled 2016-03-03: qty 30

## 2016-03-03 NOTE — Progress Notes (Signed)
Physical Therapy Treatment Patient Details Name: [name] [name] MRN: [id number] DOB: 02/20/1941 Today's Date: 03/03/2016    History of Present Illness Pt is a 75 y.o. female who was admitted to [facility] for an elective Right TKR secondary to primary OA 03/01/16.    PT Comments    Pt limited with ambulation distance d/t R knee pain and fatigue this afternoon.  Pt motivated to participate in PT and improve independence with functional mobility.  Will continue to progress pt with R knee ROM, strengthening, and increase ambulation distance per pt tolerance.   Follow Up Recommendations  SNF     Equipment Recommendations  Rolling walker with 5" wheels    Recommendations for Other Services       Precautions / Restrictions Precautions Precautions: Fall;Knee Precaution Booklet Issued: Yes (comment) Restrictions Weight Bearing Restrictions: Yes RLE Weight Bearing: Weight bearing as tolerated    Mobility  Bed Mobility Overal bed mobility: Needs Assistance Bed Mobility: Sit to Supine     Supine to sit: Min assist Sit to supine: Min assist   General bed mobility comments: assist for R LE; vc's for technique required  Transfers Overall transfer level: Needs assistance Equipment used: Rolling walker (2 wheeled) Transfers: Sit to/from [facility] Sit to Stand: Min assist Stand pivot transfers: Min assist (to commode over toilet in bathroom)       General transfer comment: vc's required for hand and feet placement; increased effort to stand  Ambulation/Gait Ambulation/Gait assistance: Min guard;Min assist Ambulation Distance (Feet):  (35 feet; 20 feet) Assistive device: Rolling walker (2 wheeled)   Gait velocity: decreased   General Gait Details: antalgic/decreased stance time R LE; limited distance d/t c/o R knee pain and fatigue; vc's required for gait technique and walker use (stay closer to RW and increase UE support through RW)   Financial trader Rankin (Stroke Patients Only)       Balance Overall balance assessment: Needs assistance Sitting-balance support: Bilateral upper extremity supported;Feet supported Sitting balance-Leahy Scale: Good     Standing balance support: Bilateral upper extremity supported (on RW) Standing balance-Leahy Scale: Fair Standing balance comment: min assist to steady with donning/doffing underwear with toileting                    Cognition Arousal/Alertness: Awake/alert Behavior During Therapy: WFL for tasks assessed/performed Overall Cognitive Status: Within Functional Limits for tasks assessed                      Exercises Total Joint Exercises  Short Arc Quad: AAROM;Strengthening;Right;15 reps;Supine Heel Slides: AAROM;Right;15 reps;Supine;Strengthening Straight Leg Raises: AAROM;Strengthening;Right;15 reps;Supine (minimal assist by therapist) Goniometric ROM: R knee extension 10 degrees short of neutral semi-supine in bed; R knee flexion 81 degrees in sitting (ROM measurements performed in AM session)    General Comments General comments (skin integrity, edema, etc.): R knee dressing in place; polar care off  Nursing cleared pt for participation in physical therapy.  Pt agreeable to PT session.      Pertinent Vitals/Pain Pain Assessment: 0-10 Pain Score: 5  Pain Location: R knee Pain Descriptors / Indicators: Sore;Tender;Operative site guarding Pain Intervention(s): Limited activity within patient's tolerance;Monitored during session;Premedicated before session;Repositioned;Ice applied    Home Living                      Prior Function  PT Goals (current goals can now be found in the care plan section) Acute Rehab PT Goals Patient Stated Goal: to have less pain PT Goal Formulation: With patient Time For Goal Achievement: 03/16/16 Potential to Achieve Goals: Fair Progress towards PT goals: Progressing  toward goals    Frequency  BID    PT Plan Current plan remains appropriate    Co-evaluation             End of Session Equipment Utilized During Treatment: Gait belt Activity Tolerance: Patient limited by pain;Patient limited by fatigue Patient left: in bed;with call bell/phone within reach;with bed alarm set;with SCD's reapplied (B heels elevated via towel rolls; polar care in place and activated)     Time: [id number] PT Time Calculation (min) (ACUTE ONLY): 43 min  Charges:  $Gait Training: 8-22 mins $Therapeutic Exercise: 8-22 mins $Therapeutic Activity: 8-22 mins                    G CodesLeitha Bleak 03/05/16, 3:10 PM Leitha Bleak, [place]

## 2016-03-03 NOTE — Progress Notes (Signed)
Plan is for patient to D/C to [facility] tomorrow 03/04/16 pending medical clearance. Health Team authorization has been received. Auth # [id number]. Patient will go to room 201-A. RN will will call report at [phone]. Clinical Education officer, museum (CSW) sent D/C Summary, FL2 and D/C Packet to [employer] today via [employer]. Patient is aware of above. CSW will continue to follow and assist as needed.   Blima Rich, LCSW [phone]

## 2016-03-03 NOTE — Progress Notes (Signed)
   Subjective: 2 Days Post-Op Procedure(s) (LRB): COMPUTER ASSISTED TOTAL KNEE ARTHROPLASTY (Right) Patient reports pain as 6 on 0-10 scale.   Patient is well, and has had no acute complaints or problems Continue with physical therapy today.  Plan is to go Rehab after hospital stay. no nausea and no vomiting Patient denies any chest pains or shortness of breath. Objective: Vital signs in last 24 hours: Temp:  [98.1 F (36.7 C)-98.6 F (37 C)] 98.1 F (36.7 C) (05/12 0447) Pulse Rate:  [97-117] 112 (05/12 0447) Resp:  [18] 18 (05/12 0447) BP: (121-156)/(60-79) 143/75 mmHg (05/12 0447) SpO2:  [95 %-98 %] 95 % (05/12 0447) well approximated incision Heels are non tender and elevated off the bed using rolled towels Intake/Output from previous day: 05/11 0701 - 05/12 0700 In: 663.3 [P.O.:290; I.V.:373.3] Out: 495 [Urine:325; Drains:170] Intake/Output this shift:     Recent Labs  03/02/16 0350 03/03/16 0558  HGB 12.5 12.2    Recent Labs  03/02/16 0350 03/03/16 0558  WBC 6.9 9.6  RBC 3.77* 3.72*  HCT 36.5 35.9  PLT 115* 102*    Recent Labs  03/02/16 0350 03/03/16 0558  NA 139 137  K 4.0 3.7  CL 107 105  CO2 23 27  BUN 14 12  CREATININE 0.58 0.51  GLUCOSE 92 113*  CALCIUM 9.0 9.1   No results for input(s): LABPT, INR in the last 72 hours.  EXAM General - Patient is Alert, Appropriate and Oriented Extremity - Neurologically intact Neurovascular intact Sensation intact distally Intact pulses distally Dorsiflexion/Plantar flexion intact Compartment soft Dressing - scant drainage Motor Function - intact, moving foot and toes well on exam.  Unable to do a SLR on own 2/2 pain  Past Medical History  Diagnosis Date  . Chronic a-fib ([place])   . DVT (deep venous thrombosis) ([place])     H/O  . Dysrhythmia     a fib  . Heart murmur   . Edema   . Hypertension     controlled  . Seizure ([place])     2 years ago knocked unconscious; hospitalized and diagnosed  with seizures; no spells in last year;   . Arthritis     osteo; in B knees;   . Stroke ([place])     1 month ago (possible stroke)    Assessment/Plan: 2 Days Post-Op Procedure(s) (LRB): COMPUTER ASSISTED TOTAL KNEE ARTHROPLASTY (Right) Active Problems:   S/P total knee arthroplasty  Estimated body mass index is 32.91 kg/(m^2) as calculated from the following:   Height as of this encounter: 5\' 1"  (1.549 m).   Weight as of this encounter: 78.971 kg (174 lb 1.6 oz). Up with therapy Plan for discharge tomorrow Discharge to SNF  Labs: reviewed DVT Prophylaxis - Xarelto, Foot Pumps and TED hose Weight-Bearing as tolerated to right leg Hemovac was discontinued today. Patient needs to have a bowel movement today. Change change dressing today  Jillyn Ledger. Angus [place] 03/03/2016, 7:28 AM

## 2016-03-03 NOTE — Progress Notes (Signed)
Occupational Therapy Treatment Patient Details Name: [name] [name] MRN: [id number] DOB: 06-28-1941 Today's Date: 03/03/2016    History of present illness Pt is a 75 y.o. female who was admitted to [facility] for an elective Right TKR secondary to primary OA 03/01/16.   OT comments  Pt. education was provided about A/E use for LE ADLs. Pt. continues to present with pain, limited ROM, decreased activity tolerance, and decreased functional mobility for ADL/IADLs. Pt. could benefit from continued skilled OT services for ADL retraining, and A/E training to improve ADL and IADL functioning.     Follow Up Recommendations  Home health OT    Equipment Recommendations  3 in 1 bedside comode;Tub/shower bench    Recommendations for Other Services      Precautions / Restrictions Precautions Precautions: Fall;Knee Precaution Booklet Issued: Yes (comment) Restrictions Weight Bearing Restrictions: Yes RLE Weight Bearing: Weight bearing as tolerated       Mobility   Balance                 ADL                       Lower Body Dressing: Moderate assistance                 General ADL Comments: Pt. education was provided about A/E use for LE dressing.      Vision                     Perception     Praxis      Cognition   Behavior During Therapy: WFL for tasks assessed/performed Overall Cognitive Status: Within Functional Limits for tasks assessed                       Extremity/Trunk Assessment               Exercises   Shoulder Instructions   General Comments      Pertinent Vitals/ Pain       Pain Assessment: 0-10 Pain Score: 5  Pain Location: Right knee Pain Descriptors / Indicators: Sore;Tender;Operative site guarding Pain Intervention(s): Limited activity within patient's tolerance;Monitored during session;Premedicated before session;Repositioned;Ice applied  Home Living                                          Prior Functioning/Environment              Frequency Min 2X/week     Progress Toward Goals  OT Goals(current goals can now be found in the care plan section)     Acute Rehab OT Goals Patient Stated Goal: to have less pain OT Goal Formulation: With patient Time For Goal Achievement: 03/16/16 Potential to Achieve Goals: Good  Plan      Co-evaluation                 End of Session     Activity Tolerance Patient tolerated treatment well;Patient limited by pain   Patient Left in bed;with call bell/phone within reach;with bed alarm set   Nurse Communication          Time: [id number] OT Time Calculation (min): 15 min  Charges: OT General Charges $OT Visit: 1 Procedure OT Treatments $Self Care/Home Management : 8-22 mins   Harrel Carina, MS, OTR/L  Harrel Carina 03/03/2016, 3:57 PM

## 2016-03-03 NOTE — Progress Notes (Signed)
Physical Therapy Treatment Patient Details Name: [name] [name] MRN: [id number] DOB: Feb 17, 1941 Today's Date: 03/03/2016    History of Present Illness Pt is a 75 y.o. female who was admitted to [facility] for an elective Right TKR secondary to primary OA 03/01/16.    PT Comments    Pt able to gradually progress distance ambulating to 40 feet with RW today (limited d/t R knee pain and fatigue).  Pt progressing with LE ex's and also R knee ROM.  Plan to discharge to [facility].  Will continue to progress pt with strengthening, ROM of R knee, and increasing ambulation distance per pt tolerance during hospital stay.   Follow Up Recommendations  SNF     Equipment Recommendations  Rolling walker with 5" wheels    Recommendations for Other Services       Precautions / Restrictions Precautions Precautions: Fall;Knee Precaution Booklet Issued: Yes (comment) Restrictions Weight Bearing Restrictions: Yes RLE Weight Bearing: Weight bearing as tolerated    Mobility  Bed Mobility Overal bed mobility: Needs Assistance Bed Mobility: Supine to Sit     Supine to sit: Min assist     General bed mobility comments: assist for R LE; vc's for technique required; increased time required to perform by pt  Transfers Overall transfer level: Needs assistance Equipment used: Rolling walker (2 wheeled) Transfers: Sit to/from [facility] Sit to Stand: Min assist Stand pivot transfers: Min assist (bed to bedside commode)       General transfer comment: vc's required for hand and feet placement; increased effort to stand  Ambulation/Gait Ambulation/Gait assistance: Min guard;Min assist Ambulation Distance (Feet): 40 Feet Assistive device: Rolling walker (2 wheeled)   Gait velocity: decreased   General Gait Details: antalgic/decreased stance time R LE; limited distance d/t c/o R knee pain and fatigue; vc's required for gait technique and walker use   Stairs             Wheelchair Mobility    Modified Rankin (Stroke Patients Only)       Balance Overall balance assessment: Needs assistance Sitting-balance support: Bilateral upper extremity supported;Feet supported Sitting balance-Leahy Scale: Good     Standing balance support: Bilateral upper extremity supported (on RW) Standing balance-Leahy Scale: Fair Standing balance comment: min assist to steady with donning/doffing underwear with toileting                    Cognition Arousal/Alertness: Awake/alert Behavior During Therapy: WFL for tasks assessed/performed Overall Cognitive Status: Within Functional Limits for tasks assessed                      Exercises Total Joint Exercises Ankle Circles/Pumps: AROM;Strengthening;Both;Supine;15 reps Quad Sets: AROM;Strengthening;Both;Supine;15 reps Short Arc Quad: Strengthening;Both;Supine;15 reps (AAROM R; AROM L) Heel Slides: Strengthening;Both;Supine;15 reps (AAROM R; AROM L) Hip ABduction/ADduction: Strengthening;Both;Supine;15 reps (AAROM R; AROM L) Straight Leg Raises: Strengthening;Both;Supine;15 reps (AAROM R (very minimal assist); AROM L) Goniometric ROM: R knee extension 10 degrees short of neutral semi-supine in bed; R knee flexion 81 degrees in sitting    General Comments General comments (skin integrity, edema, etc.): R knee dressing and polar care in place  Nursing cleared pt for participation in physical therapy.  Pt agreeable to PT session. Pt educated on incentive spirometer use and pt performed x10 reps with minimal vc's for technique.     Pertinent Vitals/Pain Pain Assessment: 0-10 Pain Score: 5  Pain Location: R knee Pain Descriptors / Indicators: Sore;Tender;Operative site guarding Pain Intervention(s): Limited  activity within patient's tolerance;Monitored during session;Premedicated before session;Repositioned;Ice applied    Home Living                      Prior Function            PT  Goals (current goals can now be found in the care plan section) Acute Rehab PT Goals Patient Stated Goal: to have less pain PT Goal Formulation: With patient Time For Goal Achievement: 03/16/16 Potential to Achieve Goals: Good Progress towards PT goals: Progressing toward goals    Frequency  BID    PT Plan Current plan remains appropriate    Co-evaluation             End of Session Equipment Utilized During Treatment: Gait belt Activity Tolerance: Patient limited by pain;Patient limited by fatigue Patient left: in chair;with call bell/phone within reach;with chair alarm set;with SCD's reapplied (B heels elevated via towel rolls; polar care in place and activated)     Time: 0905-1000 PT Time Calculation (min) (ACUTE ONLY): 55 min  Charges:  $Gait Training: 8-22 mins $Therapeutic Exercise: 23-37 mins $Therapeutic Activity: 8-22 mins                    G CodesLeitha Bleak 03/15/2016, 12:34 PM Leitha Bleak, [place]

## 2016-03-03 NOTE — Care Management Important Message (Signed)
Important Message  Patient Details  Name: [name] [name] MRN: [id number] Date of Birth: May 03, 1941   Medicare Important Message Given:  Yes    [name] A Tashena Ibach 03/03/2016, 10:33 AM

## 2016-03-03 NOTE — Progress Notes (Signed)
Patient refused iron stated it upsets stomach.

## 2016-03-04 DIAGNOSIS — M25569 Pain in unspecified knee: Secondary | ICD-10-CM | POA: Diagnosis not present

## 2016-03-04 DIAGNOSIS — I10 Essential (primary) hypertension: Secondary | ICD-10-CM | POA: Diagnosis not present

## 2016-03-04 DIAGNOSIS — Z471 Aftercare following joint replacement surgery: Secondary | ICD-10-CM | POA: Diagnosis not present

## 2016-03-04 DIAGNOSIS — M6281 Muscle weakness (generalized): Secondary | ICD-10-CM | POA: Diagnosis not present

## 2016-03-04 DIAGNOSIS — Z96651 Presence of right artificial knee joint: Secondary | ICD-10-CM | POA: Diagnosis not present

## 2016-03-04 DIAGNOSIS — I482 Chronic atrial fibrillation: Secondary | ICD-10-CM | POA: Diagnosis not present

## 2016-03-04 NOTE — Progress Notes (Signed)
   Subjective: 3 Days Post-Op Procedure(s) (LRB): COMPUTER ASSISTED TOTAL KNEE ARTHROPLASTY (Right) Patient reports pain as mild.   Patient is well, and has had no acute complaints or problems Denies any CP, SOB, ABD pain. We will continue therapy today.  Plan is to go Rehab after hospital stay.  Objective: Vital signs in last 24 hours: Temp:  [97.5 F (36.4 C)-98.6 F (37 C)] 98.1 F (36.7 C) (05/13 0802) Pulse Rate:  [78-101] 99 (05/13 0802) Resp:  [18] 18 (05/13 0802) BP: (130-136)/(46-75) 134/72 mmHg (05/13 0802) SpO2:  [95 %-100 %] 98 % (05/13 0802)  Intake/Output from previous day: 05/12 0701 - 05/13 0700 In: 290 [P.O.:290] Out: -  Intake/Output this shift:     Recent Labs  03/02/16 0350 03/03/16 0558  HGB 12.5 12.2    Recent Labs  03/02/16 0350 03/03/16 0558  WBC 6.9 9.6  RBC 3.77* 3.72*  HCT 36.5 35.9  PLT 115* 102*    Recent Labs  03/02/16 0350 03/03/16 0558  NA 139 137  K 4.0 3.7  CL 107 105  CO2 23 27  BUN 14 12  CREATININE 0.58 0.51  GLUCOSE 92 113*  CALCIUM 9.0 9.1   No results for input(s): LABPT, INR in the last 72 hours.  EXAM General - Patient is Alert, Appropriate and Oriented Extremity - Neurovascular intact Sensation intact distally Intact pulses distally Dorsiflexion/Plantar flexion intact No cellulitis present Dressing - dressing C/D/I, scant drainage and dressing changed Motor Function - intact, moving foot and toes well on exam.   Past Medical History  Diagnosis Date  . Chronic a-fib ([place])   . DVT (deep venous thrombosis) ([place])     H/O  . Dysrhythmia     a fib  . Heart murmur   . Edema   . Hypertension     controlled  . Seizure ([place])     2 years ago knocked unconscious; hospitalized and diagnosed with seizures; no spells in last year;   . Arthritis     osteo; in B knees;   . Stroke ([place])     1 month ago (possible stroke)    Assessment/Plan:   3 Days Post-Op Procedure(s) (LRB): COMPUTER ASSISTED TOTAL  KNEE ARTHROPLASTY (Right) Active Problems:   S/P total knee arthroplasty  Estimated body mass index is 32.91 kg/(m^2) as calculated from the following:   Height as of this encounter: 5\' 1"  (1.549 m).   Weight as of this encounter: 78.971 kg (174 lb 1.6 oz). Advance diet Up with therapy  Discharged to rehabilitation facility today. Follow-up [facility] in 2 weeks for staple removal and Steri-Strip application.  DVT Prophylaxis - Xarelto, Foot Pumps and TED hose Weight-Bearing as tolerated to right leg D/C O2 and Pulse OX and try on Room Air  T. Rachelle Hora, PA-C [place] 03/04/2016, 8:06 AM

## 2016-03-04 NOTE — Progress Notes (Signed)
DISCHARGE NOTE:  Report called to [place] at [place]. EMS called for pick up.

## 2016-03-04 NOTE — Progress Notes (Signed)
Clinical Social Worker informed by MD that patient is medically ready to discharge to [facility] place, Patient and  Daughter are in a agreement with plan.   . All discharge information faxed to  Facility. Rx's added to discharge packet.   RN will call report and patient will discharge to [facility] room 201-A via EMS.  Casimer Lanius. [place] Work,  9:40 AM

## 2016-03-04 NOTE — Clinical Social Work Placement (Signed)
   CLINICAL SOCIAL WORK PLACEMENT  NOTE  Date:  03/04/2016  Patient Details  Name: [name] [name] MRN: [id number] Date of Birth: 12-Jul-1941  Clinical Social Work is seeking post-discharge placement for this patient at the [place] level of care (*CSW will initial, date and re-position this form in  chart as items are completed):  Yes   Patient/family provided with [place] Work Department's list of facilities offering this level of care within the geographic area requested by the patient (or if unable, by the patient's family).  Yes   Patient/family informed of their freedom to choose among providers that offer the needed level of care, that participate in Medicare, Medicaid or managed care program needed by the patient, have an available bed and are willing to accept the patient.  Yes   Patient/family informed of [facility]'s ownership interest in [facility] and [facility], as well as of the fact that they are under no obligation to receive care at these facilities.  PASRR submitted to EDS on 03/02/16     PASRR number received on 03/02/16     Existing PASRR number confirmed on       FL2 transmitted to all facilities in geographic area requested by pt/family on 03/02/16     FL2 transmitted to all facilities within larger geographic area on       Patient informed that his/her managed care company has contracts with or will negotiate with certain facilities, including the following:        Yes   Patient/family informed of bed offers received.  Patient chooses bed at  [facility] )     Physician recommends and patient chooses bed at      Patient to be transferred to  [facility]) on 03/04/16.  Patient to be transferred to facility by EMS     Patient family notified on 03/04/16 of transfer.  Name of family member notified:  Daughter Natalie     PHYSICIAN       Additional Comment:     _______________________________________________ Maurine Cane, LCSW 03/04/2016, 10:44 AM

## 2016-03-04 NOTE — Progress Notes (Signed)
Physical Therapy Treatment Patient Details Name: [name] [name] MRN: [id number] DOB: 1941/07/18 Today's Date: 03/04/2016    History of Present Illness Pt is a 75 y.o. female who was admitted to [facility] for an elective Right TKR secondary to primary OA 03/01/16.    PT Comments    Pt tolerating treatment session well, motivated and able to complete entire PT sesssion as planned. Pt continues to make progress toward goals as evidenced by mild improved ROM, improved standing balance during functional activities, and improved pain tolerance. Pt's greatest limitation continues to be weakness and joint stiffness which continue to limit ability to perform all mobility and ADL at baseline function. Patient presenting with impairment of strength, pain, range of motion, balance, and activity tolerance, limiting ability to perform ADL and mobility tasks at  baseline level of function. Patient will benefit from skilled intervention to address the above impairments and limitations, in order to restore to prior level of function, improve patient safety upon discharge, and to decrease caregiver burden.    Follow Up Recommendations  SNF     Equipment Recommendations       Recommendations for Other Services       Precautions / Restrictions Precautions Precautions: Fall;Knee Precaution Booklet Issued: Yes (comment) Restrictions Weight Bearing Restrictions: Yes RLE Weight Bearing: Weight bearing as tolerated    Mobility  Bed Mobility Overal bed mobility: Needs Assistance Bed Mobility: Sit to Supine     Supine to sit: Supervision Sit to supine: Supervision   General bed mobility comments: Pt required Min assist for R LE and VC for technique for sit>sup  Transfers Overall transfer level: Needs assistance Equipment used: Rolling walker (2 wheeled) Transfers: Sit to/from Stand Sit to Stand: Supervision Stand pivot transfers: Supervision       General transfer comment: Some cues for RW  placement for safety.   Ambulation/Gait Ambulation/Gait assistance: Min guard Ambulation Distance (Feet):  (2x36ft; to bathroom and return; previously AMB c OT. ) Assistive device: Rolling walker (2 wheeled)     Gait velocity interpretation: <1.8 ft/sec, indicative of risk for recurrent falls General Gait Details: decreased step length on left, cues to correct.    Stairs            Wheelchair Mobility    Modified Rankin (Stroke Patients Only)       Balance 3x60 sec eyes open, firm surface, dynamic stability during functional activities for toiletting.   Overall balance assessment: Modified Independent Sitting-balance support: Bilateral upper extremity supported Sitting balance-Leahy Scale: Good     Standing balance support: Bilateral upper extremity supported Standing balance-Leahy Scale: Fair Standing balance comment: Min guard to steady during clothing mgt and hygiene for toileting                    Cognition Arousal/Alertness: Awake/alert Behavior During Therapy: WFL for tasks assessed/performed Overall Cognitive Status: Within Functional Limits for tasks assessed                      Exercises Total Joint Exercises Goniometric ROM: R Knee PROM Supine: 6-84 degrees.  Other Exercises Other Exercises: Left Quad sets, manually resisted x15 (3sH) Other Exercises: Left Heel slides 15x3sec Other Exercises: Left SAQx15, Bridging bilat 1x15 (knees at 65 degrees)    General Comments        Pertinent Vitals/Pain Pain Assessment: 0-10 Pain Score: 5  (none upon arrival, but 5/10 at end of session. ) Pain Location: R knee  Pain Descriptors /  Indicators: Operative site guarding Pain Intervention(s): Limited activity within patient's tolerance    Home Living                      Prior Function            PT Goals (current goals can now be found in the care plan section) Acute Rehab PT Goals Patient Stated Goal: to have less pain PT  Goal Formulation: With patient Time For Goal Achievement: 03/16/16 Potential to Achieve Goals: Good Progress towards PT goals: Progressing toward goals    Frequency  BID    PT Plan Current plan remains appropriate    Co-evaluation             End of Session Equipment Utilized During Treatment: Gait belt Activity Tolerance: Patient limited by pain;Patient tolerated treatment well;Patient limited by fatigue Patient left: in bed;with bed alarm set;with SCD's reapplied     Time: [id number] PT Time Calculation (min) (ACUTE ONLY): 31 min  Charges:  $Gait Training: 8-22 mins $Therapeutic Exercise: 8-22 mins                    G Codes:      10:56 AM, Mar 28, 2016 Etta Grandchild, PT, DPT PRN Physical Therapist - [place] License # [id number] [phone] (mobile)

## 2016-03-04 NOTE — Progress Notes (Signed)
Occupational Therapy Treatment Patient Details Name: [name] [name] MRN: [id number] DOB: [birth year] Today's Date: 03/04/2016    History of present illness Pt is a 75 y.o. female who was admitted to [facility] for an elective Right TKR secondary to primary OA 03/01/16.   OT comments  75 y.o. Female s/p R TKR on 03/01/16 presenting with pain, decreased strength, balance, ROM, and safety, and need for assistance for functional mobility and self care. Pt will benefit from skilled OT services to maximize safety and functional independence with mobility and self care in order to return to ILF.     Follow Up Recommendations  Home health OT    Equipment Recommendations  3 in 1 bedside comode;Tub/shower bench    Recommendations for Other Services Rehab consult    Precautions / Restrictions Precautions Precautions: Fall;Knee Precaution Booklet Issued: Yes (comment) Restrictions Weight Bearing Restrictions: Yes RLE Weight Bearing: Weight bearing as tolerated       Mobility Bed Mobility Overal bed mobility: Needs Assistance Bed Mobility: Sit to Supine     Supine to sit: Min assist Sit to supine: Min guard   General bed mobility comments: Pt required Min assist for R LE and VC for technique for sit>sup  Transfers Overall transfer level: Needs assistance Equipment used: Rolling walker (2 wheeled) Transfers: Sit to/from Stand Sit to Stand: Min guard         General transfer comment: vc's required for hand placement; increased effort to stand    Balance Overall balance assessment: Needs assistance Sitting-balance support: Bilateral upper extremity supported;Feet supported Sitting balance-Leahy Scale: Good     Standing balance support: Bilateral upper extremity supported Standing balance-Leahy Scale: Fair Standing balance comment: Min guard to steady during clothing mgt and hygiene for toileting                   ADL Overall ADL's : Needs  assistance/impaired Eating/Feeding: Independent   Grooming: Wash/dry hands;Standing;Set up Grooming Details (indicate cue type and reason): Pt performed washing hands standing at sink with Min guard with walker                 Toilet Transfer: BSC;RW;Min guard;Cueing for safety Toilet Transfer Details (indicate cue type and reason): Pt performed tiolet transfer to [facility] over standard toilet with Min guard and VC for safety Toileting- Clothing Manipulation and Hygiene: Min guard;Sit to/from stand       Functional mobility during ADLs: Min guard;Cueing for safety;Rolling walker General ADL Comments: Pt educated in use of pursed lip breathing techniques       Vision                     Perception     Praxis      Cognition   Behavior During Therapy: WFL for tasks assessed/performed Overall Cognitive Status: Within Functional Limits for tasks assessed                       Extremity/Trunk Assessment               Exercises     Shoulder Instructions       General Comments      Pertinent Vitals/ Pain       Pain Assessment: 0-10 Pain Score: 6  Pain Location: R knee Pain Descriptors / Indicators: Operative site guarding;Sore Pain Intervention(s): Limited activity within patient's tolerance;Monitored during session;Repositioned;Ice applied;Utilized relaxation techniques  Home Living  Prior Functioning/Environment              Frequency Min 2X/week     Progress Toward Goals  OT Goals(current goals can now be found in the care plan section)  Progress towards OT goals: Progressing toward goals  Acute Rehab OT Goals Patient Stated Goal: to have less pain OT Goal Formulation: With patient Time For Goal Achievement: 03/16/16 Potential to Achieve Goals: Good  Plan Discharge plan remains appropriate    Co-evaluation                 End of Session Equipment Utilized  During Treatment: Gait belt;Rolling walker   Activity Tolerance Patient tolerated treatment well;Patient limited by pain   Patient Left in bed;with call bell/phone within reach;with bed alarm set;with nursing/sitter in room   Nurse Communication          Time: [id number] OT Time Calculation (min): 31 min  Charges: OT General Charges $OT Visit: 1 Procedure OT Treatments $Self Care/Home Management : 23-37 mins  Corky Sox, OTR/L 03/04/2016, 9:25 AM

## 2016-03-06 DIAGNOSIS — I4891 Unspecified atrial fibrillation: Secondary | ICD-10-CM | POA: Diagnosis not present

## 2016-03-06 DIAGNOSIS — I10 Essential (primary) hypertension: Secondary | ICD-10-CM | POA: Diagnosis not present

## 2016-03-06 DIAGNOSIS — Z86718 Personal history of other venous thrombosis and embolism: Secondary | ICD-10-CM | POA: Diagnosis not present

## 2016-03-06 DIAGNOSIS — F0390 Unspecified dementia without behavioral disturbance: Secondary | ICD-10-CM | POA: Diagnosis not present

## 2016-03-06 DIAGNOSIS — M159 Polyosteoarthritis, unspecified: Secondary | ICD-10-CM | POA: Diagnosis not present

## 2016-03-09 DIAGNOSIS — R3915 Urgency of urination: Secondary | ICD-10-CM | POA: Diagnosis not present

## 2016-03-09 DIAGNOSIS — M545 Low back pain: Secondary | ICD-10-CM | POA: Diagnosis not present

## 2016-03-09 DIAGNOSIS — J029 Acute pharyngitis, unspecified: Secondary | ICD-10-CM | POA: Diagnosis not present

## 2016-03-10 DIAGNOSIS — J029 Acute pharyngitis, unspecified: Secondary | ICD-10-CM | POA: Diagnosis not present

## 2016-03-10 LAB — URINALYSIS COMPLETE WITH MICROSCOPIC (ARMC ONLY)
Bacteria, UA: NONE SEEN
Bilirubin Urine: NEGATIVE
Glucose, UA: NEGATIVE mg/dL
Hgb urine dipstick: NEGATIVE
Ketones, ur: NEGATIVE mg/dL
Nitrite: NEGATIVE
Protein, ur: NEGATIVE mg/dL
Specific Gravity, Urine: 1.026 (ref 1.005–1.030)
WBC, UA: NONE SEEN WBC/hpf (ref 0–5)
pH: 5 (ref 5.0–8.0)

## 2016-03-12 LAB — CULTURE, GROUP A STREP (THRC)

## 2016-03-12 LAB — URINE CULTURE

## 2016-03-13 ENCOUNTER — Telehealth: Payer: Self-pay | Admitting: Family Medicine

## 2016-03-13 NOTE — Telephone Encounter (Signed)
Pt stated she was discharged from rehab from [facility] today. Pt stated that she was there recovering from knee surgery. Pt has been on Keflex 500 mg 3 X a day since Saturday 03/11/16 for colored drainage, cough, & congestion. Pt wanted a nurse to return her call to see if that is what she should be taking. I advised that she would need to schedule an appt. Pt wanted to speak with a nurse first. Please advise. Thanks TNP

## 2016-03-13 NOTE — Telephone Encounter (Signed)
Left message to call back  

## 2016-03-14 DIAGNOSIS — M25561 Pain in right knee: Secondary | ICD-10-CM | POA: Diagnosis not present

## 2016-03-15 NOTE — Telephone Encounter (Signed)
Pt called back regarding message below.  [name]

## 2016-03-15 NOTE — Telephone Encounter (Signed)
Advised patient that per Dr. Caryn Section, patient should take Mucinex for cough and congestion. Needs OV if worsening. Patient verbalized understanding.

## 2016-03-17 DIAGNOSIS — M25561 Pain in right knee: Secondary | ICD-10-CM | POA: Diagnosis not present

## 2016-03-17 LAB — CULTURE, GROUP A STREP (THRC)

## 2016-03-21 DIAGNOSIS — M25561 Pain in right knee: Secondary | ICD-10-CM | POA: Diagnosis not present

## 2016-03-24 DIAGNOSIS — M25561 Pain in right knee: Secondary | ICD-10-CM | POA: Diagnosis not present

## 2016-03-27 ENCOUNTER — Telehealth: Payer: Self-pay | Admitting: Family Medicine

## 2016-03-27 NOTE — Telephone Encounter (Signed)
Just need to make sure Dentist is aware that she is taking Xarelto, but no additional precautions are recommended.

## 2016-03-27 NOTE — Telephone Encounter (Signed)
LMOVM for pt to return call 

## 2016-03-27 NOTE — Telephone Encounter (Signed)
Patient wanted to know if there are any precautions she needs to take before procedure? Please advise?

## 2016-03-27 NOTE — Telephone Encounter (Signed)
Pt states she is going to have dental work done to replace a crown.  Pt is concerned with her medications.  CB#[phone]/MW

## 2016-03-27 NOTE — Telephone Encounter (Signed)
Patient was notified.

## 2016-03-28 DIAGNOSIS — M25561 Pain in right knee: Secondary | ICD-10-CM | POA: Diagnosis not present

## 2016-03-31 DIAGNOSIS — M25561 Pain in right knee: Secondary | ICD-10-CM | POA: Diagnosis not present

## 2016-04-04 DIAGNOSIS — M25561 Pain in right knee: Secondary | ICD-10-CM | POA: Diagnosis not present

## 2016-04-07 DIAGNOSIS — M25561 Pain in right knee: Secondary | ICD-10-CM | POA: Diagnosis not present

## 2016-04-11 DIAGNOSIS — Z96651 Presence of right artificial knee joint: Secondary | ICD-10-CM | POA: Diagnosis not present

## 2016-04-11 DIAGNOSIS — M25561 Pain in right knee: Secondary | ICD-10-CM | POA: Diagnosis not present

## 2016-04-19 DIAGNOSIS — M5441 Lumbago with sciatica, right side: Secondary | ICD-10-CM | POA: Diagnosis not present

## 2016-04-19 DIAGNOSIS — M5442 Lumbago with sciatica, left side: Secondary | ICD-10-CM | POA: Diagnosis not present

## 2016-04-19 DIAGNOSIS — G8929 Other chronic pain: Secondary | ICD-10-CM | POA: Diagnosis not present

## 2016-05-02 DIAGNOSIS — M5442 Lumbago with sciatica, left side: Secondary | ICD-10-CM | POA: Diagnosis not present

## 2016-05-02 DIAGNOSIS — G8929 Other chronic pain: Secondary | ICD-10-CM | POA: Diagnosis not present

## 2016-05-02 DIAGNOSIS — M5441 Lumbago with sciatica, right side: Secondary | ICD-10-CM | POA: Diagnosis not present

## 2016-05-05 DIAGNOSIS — M5441 Lumbago with sciatica, right side: Secondary | ICD-10-CM | POA: Diagnosis not present

## 2016-05-05 DIAGNOSIS — G8929 Other chronic pain: Secondary | ICD-10-CM | POA: Diagnosis not present

## 2016-05-05 DIAGNOSIS — M5442 Lumbago with sciatica, left side: Secondary | ICD-10-CM | POA: Diagnosis not present

## 2016-05-10 DIAGNOSIS — M5441 Lumbago with sciatica, right side: Secondary | ICD-10-CM | POA: Diagnosis not present

## 2016-05-10 DIAGNOSIS — G8929 Other chronic pain: Secondary | ICD-10-CM | POA: Diagnosis not present

## 2016-05-10 DIAGNOSIS — M5442 Lumbago with sciatica, left side: Secondary | ICD-10-CM | POA: Diagnosis not present

## 2016-05-12 DIAGNOSIS — G8929 Other chronic pain: Secondary | ICD-10-CM | POA: Diagnosis not present

## 2016-05-12 DIAGNOSIS — M5441 Lumbago with sciatica, right side: Secondary | ICD-10-CM | POA: Diagnosis not present

## 2016-05-12 DIAGNOSIS — M5442 Lumbago with sciatica, left side: Secondary | ICD-10-CM | POA: Diagnosis not present

## 2016-05-16 ENCOUNTER — Other Ambulatory Visit: Payer: Self-pay | Admitting: *Deleted

## 2016-05-17 DIAGNOSIS — M5442 Lumbago with sciatica, left side: Secondary | ICD-10-CM | POA: Diagnosis not present

## 2016-05-17 DIAGNOSIS — M5441 Lumbago with sciatica, right side: Secondary | ICD-10-CM | POA: Diagnosis not present

## 2016-05-17 DIAGNOSIS — G8929 Other chronic pain: Secondary | ICD-10-CM | POA: Diagnosis not present

## 2016-05-18 ENCOUNTER — Other Ambulatory Visit: Payer: Self-pay | Admitting: Family Medicine

## 2016-05-18 DIAGNOSIS — Z1231 Encounter for screening mammogram for malignant neoplasm of breast: Secondary | ICD-10-CM

## 2016-05-26 ENCOUNTER — Other Ambulatory Visit: Payer: Self-pay | Admitting: Orthopedic Surgery

## 2016-05-26 DIAGNOSIS — M5416 Radiculopathy, lumbar region: Secondary | ICD-10-CM

## 2016-05-27 ENCOUNTER — Encounter: Payer: Self-pay | Admitting: Family Medicine

## 2016-05-29 NOTE — Telephone Encounter (Signed)
Looks like it has been over a year for a physical.  Last Bone density was 03/29/2015  Thanks,   -Mickel Baas

## 2016-06-02 ENCOUNTER — Other Ambulatory Visit: Payer: Self-pay | Admitting: Family Medicine

## 2016-06-02 ENCOUNTER — Ambulatory Visit
Admission: RE | Admit: 2016-06-02 | Discharge: 2016-06-02 | Disposition: A | Discharge status: Home / Self Care | Payer: PPO | Source: Ambulatory Visit | Attending: Family Medicine | Admitting: Family Medicine

## 2016-06-02 DIAGNOSIS — Z1231 Encounter for screening mammogram for malignant neoplasm of breast: Secondary | ICD-10-CM

## 2016-06-02 LAB — HM MAMMOGRAPHY

## 2016-06-07 ENCOUNTER — Ambulatory Visit
Admission: RE | Admit: 2016-06-07 | Discharge: 2016-06-07 | Disposition: A | Discharge status: Home / Self Care | Payer: PPO | Source: Ambulatory Visit | Attending: Orthopedic Surgery | Admitting: Orthopedic Surgery

## 2016-06-07 DIAGNOSIS — M5416 Radiculopathy, lumbar region: Secondary | ICD-10-CM | POA: Diagnosis not present

## 2016-06-07 DIAGNOSIS — M5137 Other intervertebral disc degeneration, lumbosacral region: Secondary | ICD-10-CM | POA: Insufficient documentation

## 2016-06-07 DIAGNOSIS — M47896 Other spondylosis, lumbar region: Secondary | ICD-10-CM | POA: Insufficient documentation

## 2016-06-07 DIAGNOSIS — M47816 Spondylosis without myelopathy or radiculopathy, lumbar region: Secondary | ICD-10-CM | POA: Diagnosis not present

## 2016-06-07 DIAGNOSIS — M5136 Other intervertebral disc degeneration, lumbar region: Secondary | ICD-10-CM | POA: Insufficient documentation

## 2016-06-20 ENCOUNTER — Encounter: Payer: Self-pay | Admitting: Family Medicine

## 2016-06-20 ENCOUNTER — Ambulatory Visit (INDEPENDENT_AMBULATORY_CARE_PROVIDER_SITE_OTHER): Payer: PPO | Admitting: Family Medicine

## 2016-06-20 VITALS — BP 110/60 | HR 73 | Temp 98.2°F | Resp 16 | Ht 61.0 in | Wt 158.0 lb

## 2016-06-20 DIAGNOSIS — M545 Low back pain, unspecified: Secondary | ICD-10-CM | POA: Insufficient documentation

## 2016-06-20 DIAGNOSIS — Z8673 Personal history of transient ischemic attack (TIA), and cerebral infarction without residual deficits: Secondary | ICD-10-CM

## 2016-06-20 DIAGNOSIS — I482 Chronic atrial fibrillation, unspecified: Secondary | ICD-10-CM

## 2016-06-20 MED ORDER — AMOXICILLIN 500 MG PO CAPS
ORAL_CAPSULE | ORAL | 0 refills | Status: DC
Start: 2016-06-20 — End: 2016-06-20

## 2016-06-20 MED ORDER — TRAMADOL HCL 50 MG PO TABS
50.0000 mg | ORAL_TABLET | Freq: Three times a day (TID) | ORAL | 2 refills | Status: DC | PRN
Start: 2016-06-20 — End: 2017-04-23

## 2016-06-20 MED ORDER — DILTIAZEM HCL ER COATED BEADS 120 MG PO CP24: 120.0000 mg | ORAL_CAPSULE | Freq: Every day | ORAL | 3 refills | Status: DC

## 2016-06-20 MED ORDER — OXYCODONE HCL 5 MG PO TABS: 5.0000 mg | ORAL_TABLET | ORAL | 0 refills | Status: DC | PRN

## 2016-06-20 MED ORDER — AMOXICILLIN 500 MG PO CAPS: ORAL_CAPSULE | ORAL | 0 refills | Status: AC

## 2016-06-20 MED ORDER — RIVAROXABAN 20 MG PO TABS: 20.0000 mg | ORAL_TABLET | Freq: Every day | ORAL | 0 refills | Status: DC

## 2016-06-20 NOTE — Progress Notes (Signed)
Patient: [name] [name] Female    DOB: 08-13-1941   75 y.o.   MRN: [id number] Visit Date: 06/20/2016  Today's Provider: Lelon Huh, MD   Chief Complaint  Patient presents with  . Medication Management   Subjective:    HPI   Patient is here to discuss the cost increase for diltiazem and xarelto since she is now in the doughnut hole. Cardiology has her on diltiazem for history of atrial fibrillation which she is tolerating well. She has accumulated quite a bit of this, but it will cost over a hundred dollars to refill. She is not sure when she will be out  She has multiple CVAs, the last was while therapeutic on warfarin so she was changed to xarelto. She has had no new neurological symptoms since then and no abnormal bleeding. She states refills for xarelto will cost around $150  She also has frequent low back pain and cannot take NSAIDs. Tramadol is usually sufficient, but sometimes pain is so severe she has to take oxycodone/apap which she states works well. She doesn't have any more of either of these.     ----------------------------------------------------------------    Allergies  Allergen Reactions  . Aspirin Other (See Comments)    On xarelto   Current Meds  Medication Sig  . acetaminophen (TYLENOL) 500 MG tablet Take 1,000 mg by mouth every 6 (six) hours as needed.  Marland Kitchen b complex vitamins tablet Take 1 tablet by mouth daily.  . Biotin 5000 MCG CAPS Take 1 capsule by mouth daily.  Marland Kitchen CALCIUM-VITAMIN D PO Take 800 mg by mouth daily. 1000 iu vitamin d  . Cholecalciferol (VITAMIN D3) 2000 units TABS Take 200 Units by mouth daily.  . Coenzyme Q10 (CO Q-10) 100 MG CAPS Take 100 mg by mouth 1 day or 1 dose.    . Cyanocobalamin (VITAMIN B 12 PO) Take 100 mg by mouth daily.   Marland Kitchen diltiazem (CARDIZEM CD) 120 MG 24 hr capsule Take 1 capsule (120 mg total) by mouth 2 (two) times daily. (Patient taking differently: Take 120 mg by mouth daily. )  . donepezil (ARICEPT) 5  MG tablet Take 5 mg by mouth every morning.  . gabapentin (NEURONTIN) 100 MG capsule Take 1 capsule (100 mg total) by mouth 2 (two) times daily.  Javier Docker Oil 300 MG CAPS Take 350 mg by mouth daily.   . Mag Aspart-Potassium Aspart (POTASSIUM & MAGNESIUM ASPARTAT PO) Take 1 tablet by mouth at bedtime.   . metoprolol (LOPRESSOR) 25 MG tablet Take 1 tablet (25 mg total) by mouth 2 (two) times daily.  . Probiotic Product (PROBIOTIC FORMULA) CAPS Take 1 capsule by mouth at bedtime.   . rivaroxaban (XARELTO) 20 MG TABS tablet Take 1 tablet (20 mg total) by mouth daily with supper.  . TURMERIC PO Take 1 capsule by mouth 2 (two) times daily. Takes 800 mg in the morning and 500 mg in the evening    Review of Systems  Constitutional: Negative for appetite change, chills, fatigue and fever.  Respiratory: Negative for chest tightness and shortness of breath.   Cardiovascular: Negative for chest pain and palpitations.  Gastrointestinal: Negative for abdominal pain, nausea and vomiting.  Musculoskeletal: Positive for back pain.  Neurological: Negative for dizziness and weakness.    Social History  Substance Use Topics  . Smoking status: Never Smoker  . Smokeless tobacco: Never Used  . Alcohol use No     Comment: wine- occasionally  Objective:   BP 110/60 (BP Location: Right Arm, Patient Position: Sitting, Cuff Size: Normal)   Pulse 73   Temp 98.2 F (36.8 C) (Oral)   Resp 16   Ht 5\' 1"  (1.549 m)   Wt 158 lb (71.7 kg)   SpO2 97%   BMI 29.85 kg/m   Physical Exam   General Appearance:    Alert, cooperative, no distress  Eyes:    PERRL, conjunctiva/corneas clear, EOM's intact       Lungs:     Clear to auscultation bilaterally, respirations unlabored  Heart:    Regular rate and rhythm  Neurologic:   Awake, alert, oriented x 3. No apparent focal neurological           defect.           Assessment & Plan:      1. Chronic atrial fibrillation (HCC) [name] [name] is listed at much lower  wholesale cost. Patient given written prescription to fill if she runs out of dilt before the end of the year  She has voucher for 30 days Xarelto. Given 30 day rx and advised she can check with [place] to see if we have refills in 3-4 weeks.  - diltiazem (CARTIA XT) 120 MG 24 hr capsule; Take 1 capsule (120 mg total) by mouth daily.  Dispense: 30 capsule; Refill: 3 - rivaroxaban (XARELTO) 20 MG TABS tablet; Take 1 tablet (20 mg total) by mouth daily with supper.  Dispense: 30 tablet; Refill: 0  2. History of CVA (cerebrovascular accident)  - rivaroxaban (XARELTO) 20 MG TABS tablet; Take 1 tablet (20 mg total) by mouth daily with supper.  Dispense: 30 tablet; Refill: 0  3. Low back pain without sciatica, unspecified back pain laterality Needs refill for : - oxyCODONE (OXY IR/ROXICODONE) 5 MG immediate release tablet; Take 1-2 tablets (5-10 mg total) by mouth every 4 (four) hours as needed for severe pain.  Dispense: 60 tablet; Refill: 0 - traMADol (ULTRAM) 50 MG tablet; Take 1 tablet (50 mg total) by mouth every 8 (eight) hours as needed for moderate pain.  Dispense: 30 tablet; Refill: 2  She has upcoming dental work and given rx for amoxicllin 400, 4 tabs prior to procedure.       Lelon Huh, MD  [facility] [name] [name]

## 2016-07-11 DIAGNOSIS — M5416 Radiculopathy, lumbar region: Secondary | ICD-10-CM | POA: Diagnosis not present

## 2016-08-02 ENCOUNTER — Encounter: Payer: Self-pay | Admitting: Family Medicine

## 2016-08-02 ENCOUNTER — Ambulatory Visit (INDEPENDENT_AMBULATORY_CARE_PROVIDER_SITE_OTHER): Payer: PPO | Admitting: Family Medicine

## 2016-08-02 VITALS — BP 120/70 | HR 57 | Temp 97.8°F | Resp 16 | Ht 61.0 in | Wt 160.0 lb

## 2016-08-02 DIAGNOSIS — M810 Age-related osteoporosis without current pathological fracture: Secondary | ICD-10-CM | POA: Diagnosis not present

## 2016-08-02 DIAGNOSIS — M545 Low back pain, unspecified: Secondary | ICD-10-CM

## 2016-08-02 DIAGNOSIS — Z23 Encounter for immunization: Secondary | ICD-10-CM

## 2016-08-02 DIAGNOSIS — Z860100 Personal history of colon polyps, unspecified: Secondary | ICD-10-CM

## 2016-08-02 DIAGNOSIS — Z0000 Encounter for general adult medical examination without abnormal findings: Secondary | ICD-10-CM | POA: Diagnosis not present

## 2016-08-02 DIAGNOSIS — R5383 Other fatigue: Secondary | ICD-10-CM

## 2016-08-02 DIAGNOSIS — I10 Essential (primary) hypertension: Secondary | ICD-10-CM

## 2016-08-02 NOTE — Progress Notes (Signed)
Patient: [name] [name], Female    DOB: April 19, 1941, 75 y.o.   MRN: [id number] Visit Date: 08/02/2016  Today's Provider: Lelon Huh, MD   Chief Complaint  Patient presents with  . Annual Exam  . Hypertension  . Fatigue   Subjective:    Annual physical [name] [name] is a 75 y.o. female. She feels fairly well. She reports exercising; yes therapy. She reports she is sleeping fairly well.  -----------------------------------------------------------   Seizures follow up.  From 02/16/2016-No seizures on current medication regiment. Has had no seizures or any other neurological symptoms this year. .   Chronic atrial fibrillation: From 06/20/2016-Cartia is listed at much lower wholesale cost. Patient given written prescription to fill if she runs out of dilt before the end of the year  Low back pain without sciatica, unspecified back pain laterality From 06/20/2016-no changes.   Hypertension, follow-up:  BP Readings from Last 3 Encounters:  08/02/16 120/70  06/20/16 110/60  03/04/16 134/72    She was last seen for hypertension 6 months ago.  BP at that visit was 119/61. Management since that visit includes; no changes.She reports good compliance with treatment. She is not having side effects. none She is exercising. She is adherent to low salt diet.   Outside blood pressures are normal. She is experiencing none.  Patient denies none.   Cardiovascular risk factors include none.  Use of agents associated with hypertension: none.   ----------------------------------------------------------------     Review of Systems  Constitutional: Positive for activity change.  HENT: Positive for dental problem.   Eyes: Negative.   Respiratory: Negative.   Cardiovascular: Negative.   Gastrointestinal: Negative.   Endocrine: Negative.   Genitourinary: Negative.   Musculoskeletal: Positive for arthralgias, back pain and gait problem.  Skin: Negative.     Allergic/Immunologic: Negative.   Neurological: Positive for weakness.  Hematological: Negative.   Psychiatric/Behavioral: Negative.     Social History   Social History  . Marital status: Divorced    Spouse name: N/A  . Number of children: 2  . Years of education: N/A   Occupational History  . Retired    Social History Main Topics  . Smoking status: Never Smoker  . Smokeless tobacco: Never Used  . Alcohol use No     Comment: wine- occasionally  . Drug use: No  . Sexual activity: Not on file   Other Topics Concern  . Not on file   Social History Narrative   Retired, divorced, gets regular exercise ([employer]- 3x weekly; walks dog)    Past Medical History:  Diagnosis Date  . Arthritis    osteo; in B knees;   . Chronic a-fib ([place])   . DVT (deep venous thrombosis) ([place])    H/O  . Dysrhythmia    a fib  . Edema   . Heart murmur   . Hypertension    controlled  . Seizure ([place])    2 years ago knocked unconscious; hospitalized and diagnosed with seizures; no spells in last year;   . Stroke [facility])    1 month ago (possible stroke)     Patient Active Problem List   Diagnosis Date Noted  . Low back pain 06/20/2016  . S/P total knee arthroplasty 03/01/2016  . Knee osteoarthritis 02/16/2016  . Weakness 09/27/2015  . 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 Arthritis of knee, degenerative 05/26/2014  . History of CVA (cerebrovascular accident) 02/16/2014  . Seizures (  [place]) 02/16/2014  . Chronic leg pain 02/16/2014  . Fatigue 07/02/2013  . HYPERTENSION, BENIGN 12/07/2010  . ATRIAL FIBRILLATION 06/29/2009  . SHORTNESS OF BREATH 06/29/2009    Past Surgical History:  Procedure Laterality Date  . ablation  2007   [place], [place]  . bilateral wrist surgery  2003  . BREAST BIOPSY Left    stereo  . BREAST SURGERY    . CATARACT EXTRACTION W/PHACO Right 06/01/2015   Procedure: CATARACT EXTRACTION PHACO AND INTRAOCULAR LENS PLACEMENT (IOC);  Surgeon:  Birder Robson, MD;  Location: [facility] ORS;  Service: Ophthalmology;  Laterality: Right;  [place]: 01:02.0 AP%: 23.1 CDE: 14.33 Fluid lot# [id number]   . CATARACT EXTRACTION W/PHACO Left 06/22/2015   Procedure: CATARACT EXTRACTION PHACO AND INTRAOCULAR LENS PLACEMENT (IOC);  Surgeon: Birder Robson, MD;  Location: [facility] ORS;  Service: Ophthalmology;  Laterality: Left;  [place]: 01:00.7 AP%: 23.1 CDE: 14.01 Lot # [id number]  . COLONOSCOPY    . KNEE ARTHROPLASTY Right 03/01/2016   Procedure: COMPUTER ASSISTED TOTAL KNEE ARTHROPLASTY;  Surgeon: Dereck Leep, MD;  Location: [facility] ORS;  Service: Orthopedics;  Laterality: Right;  . TONSILLECTOMY    . TONSILLECTOMY      Her family history includes Arthritis in her paternal grandmother; Atrial fibrillation in her mother; Congestive Heart Failure in her mother; Diabetes in her paternal grandfather; Melanoma in her mother; Stroke in her maternal grandmother and mother.    Current Meds  Medication Sig  . acetaminophen (TYLENOL) 500 MG tablet Take 1,000 mg by mouth every 6 (six) hours as needed.  Marland Kitchen b complex vitamins tablet Take 1 tablet by mouth daily.  . Biotin 5000 MCG CAPS Take 1 capsule by mouth daily.  Marland Kitchen CALCIUM-VITAMIN D PO Take 800 mg by mouth daily. 1000 iu vitamin d  . Cholecalciferol (VITAMIN D3) 2000 units TABS Take 200 Units by mouth daily.  . Coenzyme Q10 (CO Q-10) 100 MG CAPS Take 100 mg by mouth 1 day or 1 dose.    . Cyanocobalamin (VITAMIN B 12 PO) Take 100 mg by mouth daily.   Marland Kitchen diltiazem (CARTIA XT) 120 MG 24 hr capsule Take 1 capsule (120 mg total) by mouth daily.  Marland Kitchen donepezil (ARICEPT) 5 MG tablet Take 5 mg by mouth every morning.  . gabapentin (NEURONTIN) 100 MG capsule Take 1 capsule (100 mg total) by mouth 2 (two) times daily.  Javier Docker Oil 300 MG CAPS Take 350 mg by mouth daily.   . Mag Aspart-Potassium Aspart (POTASSIUM & MAGNESIUM ASPARTAT PO) Take 1 tablet by mouth at bedtime.   . metoprolol (LOPRESSOR) 25 MG tablet Take 1 tablet (25  mg total) by mouth 2 (two) times daily.  Marland Kitchen oxyCODONE (OXY IR/ROXICODONE) 5 MG immediate release tablet Take 1-2 tablets (5-10 mg total) by mouth every 4 (four) hours as needed for severe pain.  . Probiotic Product (PROBIOTIC FORMULA) CAPS Take 1 capsule by mouth at bedtime.   . rivaroxaban (XARELTO) 20 MG TABS tablet Take 1 tablet (20 mg total) by mouth daily with supper.  . traMADol (ULTRAM) 50 MG tablet Take 1 tablet (50 mg total) by mouth every 8 (eight) hours as needed for moderate pain.  . TURMERIC PO Take 1 capsule by mouth 2 (two) times daily. Takes 800 mg in the morning and 500 mg in the evening    Patient Care Team: Birdie Sons, MD as PCP - General (Family Medicine) Birdie Sons, MD (Family Medicine) [facility] Candyce Churn, MD (Neurology) Minna Merritts, MD  as Consulting Physician (Cardiology)    Objective:   Vitals: BP 120/70 (BP Location: Right Arm, Patient Position: Sitting, Cuff Size: Normal)   Pulse (!) 57   Temp 97.8 F (36.6 C) (Oral)   Resp 16   Ht 5\' 1"  (1.549 m)   Wt 160 lb (72.6 kg)   SpO2 98%   BMI 30.23 kg/m   Physical Exam   General Appearance:    Alert, cooperative, no distress, appears stated age  Head:    Normocephalic, without obvious abnormality, atraumatic  Eyes:    PERRL, conjunctiva/corneas clear, EOM's intact, fundi    benign, both eyes  Ears:    Normal TM's and external ear canals, both ears  Nose:   Nares normal, septum midline, mucosa normal, no drainage    or sinus tenderness  Throat:   Lips, mucosa, and tongue normal; teeth and gums normal  Neck:   Supple, symmetrical, trachea midline, no adenopathy;    thyroid:  no enlargement/tenderness/nodules; no carotid   bruit or JVD  Back:     Symmetric, no curvature, ROM normal, no CVA tenderness  Lungs:     Clear to auscultation bilaterally, respirations unlabored  Chest Wall:    No tenderness or deformity   Heart:    Regular rate and rhythm, S1 and S2 normal, no murmur, rub   or gallop    Breast Exam:    normal appearance, no masses or tenderness  Abdomen:     Soft, non-tender, bowel sounds active all four quadrants,    no masses, no organomegaly  Pelvic:    deferred  Extremities:   Extremities normal, atraumatic, no cyanosis or edema  Pulses:   2+ and symmetric all extremities  Skin:   Skin color, texture, turgor normal, no rashes or lesions  Lymph nodes:   Cervical, supraclavicular, and axillary nodes normal  Neurologic:   CNII-XII intact, normal strength, sensation and reflexes    throughout    Activities of Daily Living In your present state of health, do you have any difficulty performing the following activities: 08/02/2016 03/01/2016  Hearing? N -  Vision? N -  Difficulty concentrating or making decisions? Y -  Walking or climbing stairs? Y -  Dressing or bathing? N -  Doing errands, shopping? N Y  Some recent data might be hidden    Fall Risk Assessment Fall Risk  08/02/2016  Falls in the past year? Yes  Number falls in past yr: 1  Injury with Fall? No     Depression Screen PHQ 2/9 Scores 08/02/2016  PHQ - 2 Score 0  PHQ- 9 Score 3    Cognitive Testing - 6-CIT  Correct? Score   What year is it? yes 0 0 or 4  What month is it? yes 0 0 or 3  Memorize:    Pia Mau,  [birth year],  [place],      What time is it? (within 1 hour) yes 0 0 or 3  Count backwards from 20 yes 0 0, 2, or 4  Name the months of the year yes 0 0, 2, or 4  Repeat name & address above yes 0 0, 2, 4, 6, 8, or 10       TOTAL SCORE  0/28   Interpretation:  Normal  Normal (0-7) Abnormal (8-28)    Audit-C Alcohol Use Screening  Question Answer Points  How often do you have alcoholic drink? never 0  On days you do drink alcohol, how  many drinks do you typically consume? 0 0  How oftey will you drink 6 or more in a total? never 0  Total Score:  0   A score of 3 or more in women, and 4 or more in men indicates increased risk for alcohol abuse, EXCEPT if all of the  points are from question 1.     Assessment & Plan:     Annual physical  Reviewed patient's Family Medical History Reviewed and updated list of patient's medical providers Assessment of cognitive impairment was done Assessed patient's functional ability Established a written schedule for health screening [place] Completed and Reviewed  Exercise Activities and Dietary recommendations Goals    None      Immunization History  Administered Date(s) Administered  . Influenza, High Dose Seasonal PF 09/22/2015  . Pneumococcal Conjugate-13 07/24/2014  . Pneumococcal Polysaccharide-23 07/14/2009  . Tdap 01/05/2011  . Zoster 11/29/2011    Health Maintenance  Topic Date Due  . COLONOSCOPY  04/01/1992  . INFLUENZA VACCINE  05/23/2016  . MAMMOGRAM  06/02/2018  . TETANUS/TDAP  01/04/2021  . DEXA SCAN  Completed  . ZOSTAVAX  Completed  . PNA vac Low Risk Adult  Completed      Discussed health benefits of physical activity, and encouraged her to engage in regular exercise appropriate for her age and condition.    --------------------------------------------------------------------------  1. Annual physical exam   2. Other fatigue  - CBC - Comprehensive metabolic panel - TSH  3. Osteoporosis, unspecified osteoporosis type, unspecified pathological fracture presence States she is taking vitamin d 5,000 units a day. Discussed recommendations for bidphophonates. Will see how BMD looks this year.  - DG Bone Density; Future - VITAMIN D 25 Hydroxy (Vit-D Deficiency, Fractures)  4. Need for influenza vaccination  - Flu vaccine HIGH DOSE PF  5. HYPERTENSION, BENIGN Well controlled.  Continue current medications.    6. Low back pain without sciatica, unspecified back pain laterality, unspecified chronicity Chronic and stable. Doing well with occasional oxycodone.   7. History of colon polyps Colonoscopy in 2018   Lelon Huh, MD  [facility]

## 2016-08-03 LAB — CBC
Hematocrit: 41.9 % (ref 34.0–46.6)
Hemoglobin: 14.2 g/dL (ref 11.1–15.9)
MCH: 30.1 pg (ref 26.6–33.0)
MCHC: 33.9 g/dL (ref 31.5–35.7)
MCV: 89 fL (ref 79–97)
Platelets: 189 10*3/uL (ref 150–379)
RBC: 4.72 x10E6/uL (ref 3.77–5.28)
RDW: 16.8 % — ABNORMAL HIGH (ref 12.3–15.4)
WBC: 5.8 10*3/uL (ref 3.4–10.8)

## 2016-08-03 LAB — COMPREHENSIVE METABOLIC PANEL
ALT: 17 IU/L (ref 0–32)
AST: 24 IU/L (ref 0–40)
Albumin/Globulin Ratio: 2 (ref 1.2–2.2)
Albumin: 4.5 g/dL (ref 3.5–4.8)
Alkaline Phosphatase: 89 IU/L (ref 39–117)
BUN/Creatinine Ratio: 25 (ref 12–28)
BUN: 17 mg/dL (ref 8–27)
Bilirubin Total: 0.7 mg/dL (ref 0.0–1.2)
CO2: 26 mmol/L (ref 18–29)
Calcium: 9.8 mg/dL (ref 8.7–10.3)
Chloride: 100 mmol/L (ref 96–106)
Creatinine, Ser: 0.69 mg/dL (ref 0.57–1.00)
GFR calc Af Amer: 99 mL/min/{1.73_m2} (ref >59)
GFR calc non Af Amer: 86 mL/min/{1.73_m2} (ref >59)
Globulin, Total: 2.2 g/dL (ref 1.5–4.5)
Glucose: 90 mg/dL (ref 65–99)
Potassium: 5 mmol/L (ref 3.5–5.2)
Sodium: 141 mmol/L (ref 134–144)
Total Protein: 6.7 g/dL (ref 6.0–8.5)

## 2016-08-03 LAB — VITAMIN D 25 HYDROXY (VIT D DEFICIENCY, FRACTURES): Vit D, 25-Hydroxy: 39 ng/mL (ref 30.0–100.0)

## 2016-08-03 LAB — TSH: TSH: 2.13 u[IU]/mL (ref 0.450–4.500)

## 2016-08-05 ENCOUNTER — Encounter: Payer: Self-pay | Admitting: Family Medicine

## 2016-08-14 DIAGNOSIS — M5136 Other intervertebral disc degeneration, lumbar region: Secondary | ICD-10-CM | POA: Diagnosis not present

## 2016-08-14 DIAGNOSIS — M5416 Radiculopathy, lumbar region: Secondary | ICD-10-CM | POA: Diagnosis not present

## 2016-08-14 DIAGNOSIS — M48062 Spinal stenosis, lumbar region with neurogenic claudication: Secondary | ICD-10-CM | POA: Diagnosis not present

## 2016-08-30 DIAGNOSIS — M48062 Spinal stenosis, lumbar region with neurogenic claudication: Secondary | ICD-10-CM | POA: Diagnosis not present

## 2016-08-30 DIAGNOSIS — M5136 Other intervertebral disc degeneration, lumbar region: Secondary | ICD-10-CM | POA: Diagnosis not present

## 2016-08-30 DIAGNOSIS — M5416 Radiculopathy, lumbar region: Secondary | ICD-10-CM | POA: Diagnosis not present

## 2016-09-01 ENCOUNTER — Telehealth: Payer: Self-pay | Admitting: Family Medicine

## 2016-09-01 DIAGNOSIS — I482 Chronic atrial fibrillation, unspecified: Secondary | ICD-10-CM

## 2016-09-01 MED ORDER — DILTIAZEM HCL ER COATED BEADS 240 MG PO CP24: 240.0000 mg | ORAL_CAPSULE | Freq: Every day | ORAL | 3 refills | Status: DC

## 2016-09-01 NOTE — Telephone Encounter (Signed)
Rx sent to [facility]

## 2016-09-01 NOTE — Telephone Encounter (Signed)
Pt stated that she just got off the phone with her insurance about diltiazem (CARTIA XT) 120 MG 24 hr capsule. Pt stated that the bottle she had was 120 mg 12 hr capsule take twice a day and the printed Rx for diltiazem (CARTIA XT) 120 MG 24 hr capsule, she hadn't gotten it filled. Pt stated that her insurance would cover the 24 hr capsule at 240 mg not 120 mg. Pt would like to speak with a nurse about getting new Rx. Please advise. Thanks TNP

## 2016-09-01 NOTE — Telephone Encounter (Signed)
Please advise new rx for diltiazem?

## 2016-09-01 NOTE — Telephone Encounter (Signed)
OK. Can send rx for 240mg  Cartia XT to pharmacy of his choice.

## 2016-09-12 DIAGNOSIS — G40909 Epilepsy, unspecified, not intractable, without status epilepticus: Secondary | ICD-10-CM | POA: Diagnosis not present

## 2016-09-12 DIAGNOSIS — I69319 Unspecified symptoms and signs involving cognitive functions following cerebral infarction: Secondary | ICD-10-CM | POA: Diagnosis not present

## 2016-09-12 DIAGNOSIS — R2689 Other abnormalities of gait and mobility: Secondary | ICD-10-CM | POA: Diagnosis not present

## 2016-09-20 ENCOUNTER — Ambulatory Visit
Admission: RE | Admit: 2016-09-20 | Discharge: 2016-09-20 | Disposition: A | Discharge status: Home / Self Care | Payer: PPO | Source: Ambulatory Visit | Attending: Neurology | Admitting: Neurology

## 2016-09-20 DIAGNOSIS — M810 Age-related osteoporosis without current pathological fracture: Secondary | ICD-10-CM | POA: Diagnosis not present

## 2016-09-21 ENCOUNTER — Telehealth: Payer: Self-pay

## 2016-09-21 MED ORDER — ALENDRONATE SODIUM 70 MG PO TABS: 70.0000 mg | ORAL_TABLET | ORAL | 4 refills | Status: DC

## 2016-09-21 NOTE — Telephone Encounter (Signed)
-----   Message from Birdie Sons, MD sent at 09/21/2016  7:58 AM EST ----- Bone density test shows osteoporosis in her hip that has gotten worse since 2015. Need to start Alendronate 70mg  one tablet once a week, #12, rf x 4. Need follow up office visit 4 months.

## 2016-09-21 NOTE — Telephone Encounter (Signed)
[id number] 

## 2016-09-21 NOTE — Telephone Encounter (Signed)
Patient was advised and prescription sent to [facility] Follow up scheduled for 01/18/16. KW

## 2016-09-28 DIAGNOSIS — M5136 Other intervertebral disc degeneration, lumbar region: Secondary | ICD-10-CM | POA: Diagnosis not present

## 2016-09-28 DIAGNOSIS — M5416 Radiculopathy, lumbar region: Secondary | ICD-10-CM | POA: Diagnosis not present

## 2016-10-04 ENCOUNTER — Encounter: Payer: Self-pay | Admitting: Physical Therapy

## 2016-10-04 ENCOUNTER — Ambulatory Visit: Payer: PPO | Attending: Neurology | Admitting: Physical Therapy

## 2016-10-04 DIAGNOSIS — M6281 Muscle weakness (generalized): Secondary | ICD-10-CM | POA: Diagnosis not present

## 2016-10-04 DIAGNOSIS — R262 Difficulty in walking, not elsewhere classified: Secondary | ICD-10-CM | POA: Insufficient documentation

## 2016-10-04 NOTE — Patient Instructions (Addendum)
(  Home) Extension: Caudal Unilateral Hip - Prone    Lean across table on elbows. Lift one leg. Do not lift it higher than trunk. Repeat ____ times per set. Do ____ sets per session. Do ____ sessions per week.  Copyright  VHI. All rights reserved.  (Clinic) Balance: With Hip Abduction - Unilateral Stance (Resist)    Opposite side toward pulley, strap around left ankle, leg in. Swing leg across body and out to side. Use arm support only as needed for balance. Repeat ____ times per set. Do ____ sets per session. Do ____ sessions per week. Use ____ lb weights.  Copyright  VHI. All rights reserved.  Heel Raises    Stand with support. Tighten pelvic floor and hold. With knees straight, raise heels off ground. Hold ___ seconds. Relax for ___ seconds. Repeat ___ times. Do ___ times a day.  Copyright  VHI. All rights reserved.

## 2016-10-04 NOTE — Therapy (Addendum)
[place] MAIN [facility] SERVICES [address] [place], [place], [address] Phone: [phone]   Fax:  [phone]  Physical Therapy Evaluation  Patient Details  Name: [name] [name] MRN: [id number] Date of Birth: Mar 20, 1941 Referring Provider: Vladimir Crofts  Encounter Date: 10/04/2016      PT End of Session - 10/04/16 1021    Visit Number 1   Number of Visits 17   Date for PT Re-Evaluation December 08, 2016   Authorization Type g codes   Authorization Time Period 1/10   PT Start Time 1005   PT Stop Time 1100   PT Time Calculation (min) 55 min   Equipment Utilized During Treatment Gait belt   Activity Tolerance Patient tolerated treatment well;Patient limited by fatigue;Patient limited by pain   Behavior During Therapy WFL for tasks assessed/performed      Past Medical History:  Diagnosis Date  . Arthritis    osteo; in B knees;   . Chronic a-fib ([place])   . DVT (deep venous thrombosis) ([place])    H/O  . Dysrhythmia    a fib  . Edema   . Heart murmur   . Hypertension    controlled  . Seizure ([place])    2 years ago knocked unconscious; hospitalized and diagnosed with seizures; no spells in last year;   . Stroke [facility])    1 month ago (possible stroke)    Past Surgical History:  Procedure Laterality Date  . ablation  2007   [place], [place]  . bilateral wrist surgery  2003  . BREAST BIOPSY Left    stereo  . BREAST SURGERY    . CATARACT EXTRACTION W/PHACO Right 06/01/2015   Procedure: CATARACT EXTRACTION PHACO AND INTRAOCULAR LENS PLACEMENT (IOC);  Surgeon: Birder Robson, MD;  Location: [facility] ORS;  Service: Ophthalmology;  Laterality: Right;  [place]: 01:02.0 AP%: 23.1 CDE: 14.33 Fluid lot# [id number]   . CATARACT EXTRACTION W/PHACO Left 06/22/2015   Procedure: CATARACT EXTRACTION PHACO AND INTRAOCULAR LENS PLACEMENT (IOC);  Surgeon: Birder Robson, MD;  Location: [facility] ORS;  Service: Ophthalmology;  Laterality: Left;  [place]: 01:00.7 AP%:  23.1 CDE: 14.01 Lot # [id number]  . COLONOSCOPY    . KNEE ARTHROPLASTY Right 03/01/2016   Procedure: COMPUTER ASSISTED TOTAL KNEE ARTHROPLASTY;  Surgeon: Dereck Leep, MD;  Location: [facility] ORS;  Service: Orthopedics;  Laterality: Right;  . TONSILLECTOMY    . TONSILLECTOMY      There were no vitals filed for this visit.       Subjective Assessment - 10/04/16 1012    Subjective Patient has back pain that is chronic, 6 months, and has imbalance and unsteady gait.   Pertinent History Chronic back pain, R TKR, Bilateral wrist surgery, CVA, a-fib   Currently in Pain? Yes   Pain Score 8    Pain Location Back   Pain Descriptors / Indicators Aching   Pain Type Chronic pain   Pain Onset More than a month ago   Pain Frequency Constant            [facility] PT Assessment - 10/04/16 0001      Assessment   Medical Diagnosis imbalance   Referring Provider [facility] K   Onset Date/Surgical Date 09/13/16   Hand Dominance Right   Prior Therapy 1 year ago     Precautions   Precautions Fall     Restrictions   Weight Bearing Restrictions No     Balance Screen   Has the patient fallen  in the past 6 months Yes   Has the patient had a decrease in activity level because of a fear of falling?  Yes   Is the patient reluctant to leave their home because of a fear of falling?  No     Home Environment   Living Environment Private residence   Living Arrangements Alone   Available Help at Discharge Friend(s);Family   Type of Home Apartment   Home Access Level entry   Home Layout One level   [place] - single point;Walker - 2 wheels;Walker - 4 wheels;Grab bars - tub/shower;Shower seat - built in   Additional Comments lives in retirement community in an apartment; no stairs to enter house;      Prior Function   Level of Independence Independent;Requires assistive device for independence   Vocation Retired   Leisure TV, play cards, walk Orthoptist   Overall Cognitive  Status Within Functional Limits for tasks assessed       PAIN: 8/10 low back and RLE  POSTURE: fwd   PROM/AROM: WNL  STRENGTH:  Graded on a 0-5 scale Muscle Group Left Right                          Hip Flex 3/5 4/5  Hip Abd 3/5 3+/5  Hip Add 1/5 3/5  Hip Ext 2/5 2/5  Hip IR/ER 4/5 4/5  Knee Flex    Knee Ext    Ankle DF    Ankle PF     SENSATION: WNL    FUNCTIONAL MOBILITY: guarded mobility for transfers and rolling   BALANCE:unable to tandem stand, unable to single leg stand   GAIT: Patient ambulates with spc and slow gait speed  OUTCOME MEASURES: TEST Outcome Interpretation  5 times sit<>stand 19.54sec >60 yo, >15 sec indicates increased risk for falls  10 meter walk test     . 56            m/s <1.0 m/s indicates increased risk for falls; limited community ambulator  Timed up and Go   20.15              sec <14 sec indicates increased risk for falls  6 minute walk test  550            Feet 1000 feet is community ambulator              HEP instructed hip abd and hip extension with RTB, heel raises x 15                     PT Education - 10/04/16 1020    Education provided Yes   Education Details plan of care   Person(s) Educated Patient   Methods Explanation   Comprehension Verbalized understanding             PT Long Term Goals - 10/04/16 1025      PT LONG TERM GOAL #1   Title Patient will be independent in home exercise program to improve strength/mobility for better functional independence with ADLs by 11/29/16   Time 8   Period Weeks   Status New     PT LONG TERM GOAL #2   Title Patient (> 45 years old) will complete five times sit to stand test in < 15 seconds indicating an increased LE strength and improved balance by 11/29/16   Time 8   Period Weeks  Status New     PT LONG TERM GOAL #3   Title Patient will increase six minute walk test distance to >1000 for progression to community ambulator and improve gait  ability by 11/29/16   Time 8   Period Weeks   Status New     PT LONG TERM GOAL #4   Title Patient will increase 10 meter walk test to >1.15m/s as to improve gait speed for better community ambulation and to reduce fall risk by 11/29/16   Time 8   Period Weeks   Status New     PT LONG TERM GOAL #5   Title Patient will tolerate 5 seconds of single leg stance without loss of balance to improve ability to get in and out of shower safely by 11/29/16   Time 8   Period Weeks   Status New               Plan - 2016-10-14 1022    Clinical Impression Statement Patient has unsteady gait, decreased LE strength, decreased dynamic standing balance and decreased outcome measures that indicate a increased falls risk.    Rehab Potential Fair   Clinical Impairments Affecting Rehab Potential pain in RLE down to foot intermittently, pain across back   PT Frequency 2x / week   PT Duration 8 weeks   PT Treatment/Interventions Aquatic Therapy;Cryotherapy;Moist Heat;Gait training;Therapeutic exercise;Balance training;Neuromuscular re-education;Passive range of motion   PT Next Visit Plan balance trainng and strengthening of LE and core   PT Home Exercise Plan standing hip abd and standing hip extension   Consulted and Agree with Plan of Care Patient      Patient will benefit from skilled therapeutic intervention in order to improve the following deficits and impairments:  Abnormal gait, Decreased balance, Decreased endurance, Difficulty walking, Decreased range of motion, Decreased strength, Pain, Impaired sensation  Visit Diagnosis: Muscle weakness (generalized)  Difficulty in walking, not elsewhere classified      G-Codes - 2016-10-14 1027    Functional Assessment Tool Used TUG, 10 MW, 6 MW, 5 x sit to stand   Functional Limitation Mobility: Walking and moving around   Mobility: Walking and Moving Around Current Status [phone]) At least 60 percent but less than 80 percent impaired, limited or  restricted   Mobility: Walking and Moving Around Goal Status [phone]) At least 40 percent but less than 60 percent impaired, limited or restricted       Problem List Patient Active Problem List   Diagnosis Date Noted  . Osteoporosis 08/02/2016  . History of colon polyps 08/02/2016  . Low back pain 06/20/2016  . S/P total knee arthroplasty 03/01/2016  . Knee osteoarthritis 02/16/2016  . Weakness 09/27/2015  . 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 Arthritis of knee, degenerative 05/26/2014  . History of CVA (cerebrovascular accident) 02/16/2014  . Seizures ([place]) 02/16/2014  . Chronic leg pain 02/16/2014  . Fatigue 07/02/2013  . HYPERTENSION, BENIGN 12/07/2010  . ATRIAL FIBRILLATION 06/29/2009  . SHORTNESS OF BREATH 06/29/2009   Alanson Puls, PT, DPT [place], [place] S Oct 14, 2016, 10:28 AM  [place] MAIN [facility] SERVICES [address] [place], [place], [address] Phone: [phone]   Fax:  [phone]  Name: [name] [name] MRN: [id number] Date of Birth: 18-Mar-1941

## 2016-10-11 ENCOUNTER — Ambulatory Visit: Payer: PPO | Admitting: Physical Therapy

## 2016-10-18 ENCOUNTER — Encounter: Payer: PPO | Admitting: Physical Therapy

## 2016-10-25 ENCOUNTER — Encounter: Payer: Self-pay | Admitting: Physical Therapy

## 2016-10-25 ENCOUNTER — Ambulatory Visit: Payer: PPO | Attending: Neurology | Admitting: Physical Therapy

## 2016-10-25 DIAGNOSIS — M6281 Muscle weakness (generalized): Secondary | ICD-10-CM | POA: Insufficient documentation

## 2016-10-25 DIAGNOSIS — R262 Difficulty in walking, not elsewhere classified: Secondary | ICD-10-CM | POA: Insufficient documentation

## 2016-10-25 NOTE — Patient Instructions (Addendum)
Heel Raises    Stand with support. Tighten pelvic floor and hold. With knees straight, raise heels off ground. Hold ___ seconds. Relax for ___ seconds. Repeat ___ times. Do ___ times a day.  Copyright  VHI. All rights reserved.    

## 2016-10-25 NOTE — Therapy (Addendum)
[place] MAIN [facility] SERVICES [address] [place], [place], [address] Phone: [phone]   Fax:  [phone]  Physical Therapy Treatment  Patient Details  Name: [name] [name] MRN: [id number] Date of Birth: 10-28-1940 Referring Provider: Vladimir Crofts  Encounter Date: 10/25/2016      PT End of Session - 10/25/16 1011    Visit Number 2   Number of Visits 17   Date for PT Re-Evaluation 2016-12-02   Authorization Type g codes   Authorization Time Period 2/10   PT Start Time 1006   PT Stop Time 1046   PT Time Calculation (min) 40 min   Equipment Utilized During Treatment Gait belt   Activity Tolerance Patient tolerated treatment well;Patient limited by fatigue;Patient limited by pain   Behavior During Therapy [facility] for tasks assessed/performed      Past Medical History:  Diagnosis Date  . Arthritis    osteo; in B knees;   . Chronic a-fib ([place])   . DVT (deep venous thrombosis) ([place])    H/O  . Dysrhythmia    a fib  . Edema   . Heart murmur   . Hypertension    controlled  . Seizure ([place])    2 years ago knocked unconscious; hospitalized and diagnosed with seizures; no spells in last year;   . Stroke [facility])    1 month ago (possible stroke)    Past Surgical History:  Procedure Laterality Date  . ablation  2007   [place], [place]  . bilateral wrist surgery  2003  . BREAST BIOPSY Left    stereo  . BREAST SURGERY    . CATARACT EXTRACTION W/PHACO Right 06/01/2015   Procedure: CATARACT EXTRACTION PHACO AND INTRAOCULAR LENS PLACEMENT (IOC);  Surgeon: Birder Robson, MD;  Location: [facility] ORS;  Service: Ophthalmology;  Laterality: Right;  [place]: 01:02.0 AP%: 23.1 CDE: 14.33 Fluid lot# [id number]   . CATARACT EXTRACTION W/PHACO Left 06/22/2015   Procedure: CATARACT EXTRACTION PHACO AND INTRAOCULAR LENS PLACEMENT (IOC);  Surgeon: Birder Robson, MD;  Location: [facility] ORS;  Service: Ophthalmology;  Laterality: Left;  [place]: 01:00.7 AP%:  23.1 CDE: 14.01 Lot # [id number]  . COLONOSCOPY    . KNEE ARTHROPLASTY Right 03/01/2016   Procedure: COMPUTER ASSISTED TOTAL KNEE ARTHROPLASTY;  Surgeon: Dereck Leep, MD;  Location: [facility] ORS;  Service: Orthopedics;  Laterality: Right;  . TONSILLECTOMY    . TONSILLECTOMY      There were no vitals filed for this visit.      Subjective Assessment - 10/25/16 1008    Subjective Patient has back pain that is chronic, 6 months, and has imbalance and unsteady gait. Today she his having right buttock/ hip pain and is 8/10.    Pertinent History Chronic back pain, R TKR, Bilateral wrist surgery, CVA, a-fib   Limitations Standing;Walking;House hold activities   Patient Stated Goals Patient wants to be able to walk without AD   Pain Score 8    Pain Location Back   Pain Descriptors / Indicators Aching   Pain Type Acute pain   Pain Onset More than a month ago   Pain Frequency Constant      NMR: Tapping 6 inch step BLE x 20, CGA Side tapping 6 inch step BLE x 20, CGA Step ups to 6 inch stool x 20 , CGA Standing on 1/2 foam flat side up and down CGA x 20 minutes with head turns Side stepping in parallel bars without UE support and CGA Standing  on foam and reaching across midline with cones   Therapeutic exercise; Hip abd/ext/flex with RTB x 15 x 2 Leg press x 20 x 3 90 lbs, heel raises x 15 with 45 lbs  Patient needs occasional verbal cueing to improve posture and cueing to correctly perform exercises slowly,and cues to weight shift fwd.    She says that his right hip feels better after her treatment.                           PT Education - 10/25/16 1049    Education Details progressed HEP and reviewed HEP        Education provided Yes   Education Details progressed HEP and reviewed HEP   Person(s) Educated Patient   Methods Explanation   Comprehension Verbalized understanding              PT Long Term Goals - 10/04/16 1025      PT LONG  TERM GOAL #1   Title Patient will be independent in home exercise program to improve strength/mobility for better functional independence with ADLs by 11/29/16   Time 8   Period Weeks   Status New     PT LONG TERM GOAL #2   Title Patient (> 75 years old) will complete five times sit to stand test in < 15 seconds indicating an increased LE strength and improved balance by 11/29/16   Time 8   Period Weeks   Status New     PT LONG TERM GOAL #3   Title Patient will increase six minute walk test distance to >1000 for progression to community ambulator and improve gait ability by 11/29/16   Time 8   Period Weeks   Status New     PT LONG TERM GOAL #4   Title Patient will increase 10 meter walk test to >1.2m/s as to improve gait speed for better community ambulation and to reduce fall risk by 11/29/16   Time 8   Period Weeks   Status New     PT LONG TERM GOAL #5   Title Patient will tolerate 5 seconds of single leg stance without loss of balance to improve ability to get in and out of shower safely by 11/29/16   Time 8   Period Weeks   Status New               Plan - 10/25/16 1017    Clinical Impression Statement Patient has unsteady gait using a spc. She has difficulty with single leg standing activities and reaching fwd and across her midline. She has weakness in BLE that limits her ability to stand and walk. She needs CGA for all dynamic standing activities and was progressed in her HEP for strengthening. She reports that her right hip/buttock / back pain is better following therapy.    Rehab Potential Fair   Clinical Impairments Affecting Rehab Potential pain in RLE down to foot intermittently, pain across back   PT Frequency 2x / week   PT Duration 8 weeks   PT Treatment/Interventions Aquatic Therapy;Cryotherapy;Moist Heat;Gait training;Therapeutic exercise;Balance training;Neuromuscular re-education;Passive range of motion   PT Next Visit Plan balance trainng and strengthening of LE  and core   PT Home Exercise Plan standing hip abd and standing hip extension   Consulted and Agree with Plan of Care Patient      Patient will benefit from skilled therapeutic intervention in order to improve the following deficits and  impairments:  Abnormal gait, Decreased balance, Decreased endurance, Difficulty walking, Decreased range of motion, Decreased strength, Pain, Impaired sensation  Visit Diagnosis: Muscle weakness (generalized)  Difficulty in walking, not elsewhere classified     Problem List Patient Active Problem List   Diagnosis Date Noted  . Osteoporosis 08/02/2016  . History of colon polyps 08/02/2016  . Low back pain 06/20/2016  . S/P total knee arthroplasty 03/01/2016  . Knee osteoarthritis 02/16/2016  . Weakness 09/27/2015  . 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 Arthritis of knee, degenerative 05/26/2014  . History of CVA (cerebrovascular accident) 02/16/2014  . Seizures ([place]) 02/16/2014  . Chronic leg pain 02/16/2014  . Fatigue 07/02/2013  . HYPERTENSION, BENIGN 12/07/2010  . ATRIAL FIBRILLATION 06/29/2009  . SHORTNESS OF BREATH 06/29/2009   Alanson Puls, PT, DPT [facility], Minette Headland S 10/25/2016, 10:51 AM  [place] MAIN [facility] SERVICES [address] [place], [place], [address] Phone: [phone]   Fax:  [phone]  Name: [name] [name] MRN: [id number] Date of Birth: 1941-05-12

## 2016-11-01 ENCOUNTER — Encounter: Payer: Self-pay | Admitting: Physical Therapy

## 2016-11-01 ENCOUNTER — Ambulatory Visit: Payer: PPO | Admitting: Physical Therapy

## 2016-11-01 DIAGNOSIS — R262 Difficulty in walking, not elsewhere classified: Secondary | ICD-10-CM

## 2016-11-01 DIAGNOSIS — M6281 Muscle weakness (generalized): Secondary | ICD-10-CM

## 2016-11-01 NOTE — Therapy (Addendum)
[place] MAIN [facility] SERVICES [address] [place], [place], [address] Phone: [phone]   Fax:  [phone]  Physical Therapy Treatment  Patient Details  Name: [name] [name] MRN: [id number] Date of Birth: January 29, 1941 Referring Provider: Vladimir Crofts  Encounter Date: 11/01/2016      PT End of Session - 11/01/16 1013    Visit Number 3   Number of Visits 17   Date for PT Re-Evaluation 12-05-2016   Authorization Type g codes   Authorization Time Period 3/10   PT Start Time 1005   PT Stop Time 1045   PT Time Calculation (min) 40 min   Equipment Utilized During Treatment Gait belt   Activity Tolerance Patient tolerated treatment well;Patient limited by fatigue;Patient limited by pain   Behavior During Therapy [facility] for tasks assessed/performed      Past Medical History:  Diagnosis Date  . Arthritis    osteo; in B knees;   . Chronic a-fib ([place])   . DVT (deep venous thrombosis) ([place])    H/O  . Dysrhythmia    a fib  . Edema   . Heart murmur   . Hypertension    controlled  . Seizure ([place])    2 years ago knocked unconscious; hospitalized and diagnosed with seizures; no spells in last year;   . Stroke [facility])    1 month ago (possible stroke)    Past Surgical History:  Procedure Laterality Date  . ablation  2007   [place], [place]  . bilateral wrist surgery  2003  . BREAST BIOPSY Left    stereo  . BREAST SURGERY    . CATARACT EXTRACTION W/PHACO Right 06/01/2015   Procedure: CATARACT EXTRACTION PHACO AND INTRAOCULAR LENS PLACEMENT (IOC);  Surgeon: Birder Robson, MD;  Location: [facility] ORS;  Service: Ophthalmology;  Laterality: Right;  [place]: 01:02.0 AP%: 23.1 CDE: 14.33 Fluid lot# [id number]   . CATARACT EXTRACTION W/PHACO Left 06/22/2015   Procedure: CATARACT EXTRACTION PHACO AND INTRAOCULAR LENS PLACEMENT (IOC);  Surgeon: Birder Robson, MD;  Location: [facility] ORS;  Service: Ophthalmology;  Laterality: Left;  [place]: 01:00.7 AP%:  23.1 CDE: 14.01 Lot # [id number]  . COLONOSCOPY    . KNEE ARTHROPLASTY Right 03/01/2016   Procedure: COMPUTER ASSISTED TOTAL KNEE ARTHROPLASTY;  Surgeon: Dereck Leep, MD;  Location: [facility] ORS;  Service: Orthopedics;  Laterality: Right;  . TONSILLECTOMY    . TONSILLECTOMY      There were no vitals filed for this visit.      Subjective Assessment - 11/01/16 1009    Subjective Patient has back pain that is chronic, 6 months, and has imbalance and unsteady gait. Today she his having left knee pain 5/10.    Pertinent History Chronic back pain, R TKR, Bilateral wrist surgery, CVA, a-fib   Limitations Standing;Walking;House hold activities   Patient Stated Goals Patient wants to be able to walk without AD   Pain Score 5    Pain Location Knee   Pain Type Acute pain   Pain Onset Today   Pain Frequency Rarely   Multiple Pain Sites No      NMR: Tapping 6 inch step BLE x 20, CGA Blue foam and step ups to 6 inch step x 20 CGA Matrix fwd/bwd/side stepping x 5 CGA Step ups to 6 inch stool side side stepping left and right  x 20 , CGA   Patient needs occasional verbal cueing to improve posture and cueing to correctly perform exercises slowly,and cues to  weight shift fwd.    She says that her left knee feels better after her treatment.                           PT Education - 11/01/16 1013    Education provided Yes   Education Details progressed HEP and reviewed HEP   Person(s) Educated Patient   Methods Explanation   Comprehension Verbalized understanding             PT Long Term Goals - 10/04/16 1025      PT LONG TERM GOAL #1   Title Patient will be independent in home exercise program to improve strength/mobility for better functional independence with ADLs by 11/29/16   Time 8   Period Weeks   Status New     PT LONG TERM GOAL #2   Title Patient (> 25 years old) will complete five times sit to stand test in < 15 seconds indicating an increased LE  strength and improved balance by 11/29/16   Time 8   Period Weeks   Status New     PT LONG TERM GOAL #3   Title Patient will increase six minute walk test distance to >1000 for progression to community ambulator and improve gait ability by 11/29/16   Time 8   Period Weeks   Status New     PT LONG TERM GOAL #4   Title Patient will increase 10 meter walk test to >1.66m/s as to improve gait speed for better community ambulation and to reduce fall risk by 11/29/16   Time 8   Period Weeks   Status New     PT LONG TERM GOAL #5   Title Patient will tolerate 5 seconds of single leg stance without loss of balance to improve ability to get in and out of shower safely by 11/29/16   Time 8   Period Weeks   Status New               Plan - 11/01/16 1014    Clinical Impression Statement Patient continues to have unsteady gait using a spc. She has loss of balance with single leg activities, uneven surface changes and reaching across midline with dynamic standing balance changes.    Rehab Potential Fair   Clinical Impairments Affecting Rehab Potential pain in RLE down to foot intermittently, pain across back   PT Frequency 2x / week   PT Duration 8 weeks   PT Treatment/Interventions Aquatic Therapy;Cryotherapy;Moist Heat;Gait training;Therapeutic exercise;Balance training;Neuromuscular re-education;Passive range of motion   PT Next Visit Plan balance trainng and strengthening of LE and core   PT Home Exercise Plan standing hip abd and standing hip extension   Consulted and Agree with Plan of Care Patient      Patient will benefit from skilled therapeutic intervention in order to improve the following deficits and impairments:  Abnormal gait, Decreased balance, Decreased endurance, Difficulty walking, Decreased range of motion, Decreased strength, Pain, Impaired sensation  Visit Diagnosis: Muscle weakness (generalized)  Difficulty in walking, not elsewhere classified     Problem  List Patient Active Problem List   Diagnosis Date Noted  . Osteoporosis 08/02/2016  . History of colon polyps 08/02/2016  . Low back pain 06/20/2016  . S/P total knee arthroplasty 03/01/2016  . Knee osteoarthritis 02/16/2016  . Weakness 09/27/2015  . 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 Arthritis of knee, degenerative 05/26/2014  . History  of CVA (cerebrovascular accident) 02/16/2014  . Seizures ([place]) 02/16/2014  . Chronic leg pain 02/16/2014  . Fatigue 07/02/2013  . HYPERTENSION, BENIGN 12/07/2010  . ATRIAL FIBRILLATION 06/29/2009  . SHORTNESS OF BREATH 06/29/2009   Alanson Puls, PT, DPT [place], Minette Headland S 11/01/2016, 10:54 AM  [place] MAIN [facility] SERVICES [address] [place], [place], [address] Phone: [phone]   Fax:  [phone]  Name: [name] [name] MRN: [id number] Date of Birth: 26-Jul-1941

## 2016-11-01 NOTE — Patient Instructions (Addendum)
(  Clinic) Balance: With Hip Abduction - Unilateral Stance (Resist)    Opposite side toward pulley, strap around left ankle, leg in. Swing leg across body and out to side. Use arm support only as needed for balance. Repeat ____ times per set. Do ____ sets per session. Do ____ sessions per week. Use ____ lb weights.  Copyright  VHI. All rights reserved.  EXTENSION: Standing - Resistance Band (Active)    Stand, both feet flat. Against yellow resistance band, draw right leg behind body as far as possible. Complete ___ sets of ___ repetitions. Perform ___ sessions per day.  http://gtsc.exer.us/83   Copyright  VHI. All rights reserved.  Chair Push-Up    With hands pushing down on armrests, lean forward and try to lift buttocks. Return to sitting position. Repeat ____ times. Do ____ sessions per day.  http://gt2.exer.us/466   Copyright  VHI. All rights reserved.

## 2016-11-08 ENCOUNTER — Ambulatory Visit: Payer: PPO | Admitting: Physical Therapy

## 2016-11-13 DIAGNOSIS — M5416 Radiculopathy, lumbar region: Secondary | ICD-10-CM | POA: Diagnosis not present

## 2016-11-13 DIAGNOSIS — M48062 Spinal stenosis, lumbar region with neurogenic claudication: Secondary | ICD-10-CM | POA: Diagnosis not present

## 2016-11-13 DIAGNOSIS — M5136 Other intervertebral disc degeneration, lumbar region: Secondary | ICD-10-CM | POA: Diagnosis not present

## 2016-11-15 ENCOUNTER — Encounter: Payer: Self-pay | Admitting: Physical Therapy

## 2016-11-15 ENCOUNTER — Ambulatory Visit: Payer: PPO | Admitting: Physical Therapy

## 2016-11-15 DIAGNOSIS — M6281 Muscle weakness (generalized): Secondary | ICD-10-CM

## 2016-11-15 DIAGNOSIS — R262 Difficulty in walking, not elsewhere classified: Secondary | ICD-10-CM

## 2016-11-15 NOTE — Therapy (Addendum)
[place] MAIN [facility] SERVICES [address] [place], [place], [address] Phone: [phone]   Fax:  [phone]  Physical Therapy Treatment  Patient Details  Name: [name] [name] MRN: [id number] Date of Birth: 01-26-1941 Referring Provider: Vladimir Crofts  Encounter Date: 11/15/2016      PT End of Session - 11/15/16 1016    Visit Number 4   Number of Visits 17   Date for PT Re-Evaluation 12/27/16   Authorization Type g codes   Authorization Time Period 4/10   PT Start Time 1000   PT Stop Time 1040   PT Time Calculation (min) 40 min   Equipment Utilized During Treatment Gait belt   Activity Tolerance Patient tolerated treatment well;Patient limited by fatigue;Patient limited by pain   Behavior During Therapy [facility] for tasks assessed/performed      Past Medical History:  Diagnosis Date  . Arthritis    osteo; in B knees;   . Chronic a-fib ([place])   . DVT (deep venous thrombosis) ([place])    H/O  . Dysrhythmia    a fib  . Edema   . Heart murmur   . Hypertension    controlled  . Seizure ([place])    2 years ago knocked unconscious; hospitalized and diagnosed with seizures; no spells in last year;   . Stroke [facility])    1 month ago (possible stroke)    Past Surgical History:  Procedure Laterality Date  . ablation  2007   [place], [place]  . bilateral wrist surgery  2003  . BREAST BIOPSY Left    stereo  . BREAST SURGERY    . CATARACT EXTRACTION W/PHACO Right 06/01/2015   Procedure: CATARACT EXTRACTION PHACO AND INTRAOCULAR LENS PLACEMENT (IOC);  Surgeon: Birder Robson, MD;  Location: [facility] ORS;  Service: Ophthalmology;  Laterality: Right;  [place]: 01:02.0 AP%: 23.1 CDE: 14.33 Fluid lot# [id number]   . CATARACT EXTRACTION W/PHACO Left 06/22/2015   Procedure: CATARACT EXTRACTION PHACO AND INTRAOCULAR LENS PLACEMENT (IOC);  Surgeon: Birder Robson, MD;  Location: [facility] ORS;  Service: Ophthalmology;  Laterality: Left;  [place]: 01:00.7 AP%:  23.1 CDE: 14.01 Lot # [id number]  . COLONOSCOPY    . KNEE ARTHROPLASTY Right 03/01/2016   Procedure: COMPUTER ASSISTED TOTAL KNEE ARTHROPLASTY;  Surgeon: Dereck Leep, MD;  Location: [facility] ORS;  Service: Orthopedics;  Laterality: Right;  . TONSILLECTOMY    . TONSILLECTOMY      There were no vitals filed for this visit.      Subjective Assessment - 11/15/16 1014    Subjective Patient has back pain that is chronic, 6 months, and has imbalance and unsteady gait. Today she her left knee pain is better.  She is worried about her 25 dollar co-pay each visit and only wants to come 1 time a week.    Pertinent History Chronic back pain, R TKR, Bilateral wrist surgery, CVA, a-fib   Limitations Standing;Walking;House hold activities   Patient Stated Goals Patient wants to be able to walk without AD   Currently in Pain? Yes   Pain Score 7    Pain Location Back   Pain Descriptors / Indicators Aching   Pain Onset Today   Multiple Pain Sites No     Neuromuscular training: Nu-step for 5 min warm up L 1 Matrix fwd and bwd waking with 7. 5 lbs x 5 with small step length and decreased step height and difficulty controlling her pace and foot placement Foam standing in parallel bars  and tapping alternating LE's to 6 inch stool  Step ups from foam to 6 inch stool in parallel bars x 20  Side stepping on blue foam left and right  X 5  Standing 4 way hip with RTB x 15   Marching with RTB x 15 Patient needs occasional verbal cueing to improve posture and cueing to correctly perform exercises slowly, tightening each LE to increase motor firing of desired muscle to encourage fatigue.                            PT Education - 11/15/16 1016    Education provided Yes   Education Details HEP   Person(s) Educated Patient   Methods Explanation   Comprehension Verbalized understanding             PT Long Term Goals - 10/04/16 1025      PT LONG TERM GOAL #1   Title Patient will  be independent in home exercise program to improve strength/mobility for better functional independence with ADLs by 11/29/16   Time 8   Period Weeks   Status New     PT LONG TERM GOAL #2   Title Patient (> 21 years old) will complete five times sit to stand test in < 15 seconds indicating an increased LE strength and improved balance by 11/29/16   Time 8   Period Weeks   Status New     PT LONG TERM GOAL #3   Title Patient will increase six minute walk test distance to >1000 for progression to community ambulator and improve gait ability by 11/29/16   Time 8   Period Weeks   Status New     PT LONG TERM GOAL #4   Title Patient will increase 10 meter walk test to >1.28m/s as to improve gait speed for better community ambulation and to reduce fall risk by 11/29/16   Time 8   Period Weeks   Status New     PT LONG TERM GOAL #5   Title Patient will tolerate 5 seconds of single leg stance without loss of balance to improve ability to get in and out of shower safely by 11/29/16   Time 8   Period Weeks   Status New               Plan - 11/15/16 1017    Clinical Impression Statement Patient is educated about correct posture and looking up ahead during ambulation. She uses a spc and has slow gait speed and performs balance and exericses to imrpove her mobiltiy but has back pain that limits her standing  tolerance.    Rehab Potential Fair   Clinical Impairments Affecting Rehab Potential pain in RLE down to foot intermittently, pain across back   PT Frequency 2x / week   PT Duration 8 weeks   PT Treatment/Interventions Aquatic Therapy;Cryotherapy;Moist Heat;Gait training;Therapeutic exercise;Balance training;Neuromuscular re-education;Passive range of motion   PT Next Visit Plan balance trainng and strengthening of LE and core   PT Home Exercise Plan standing hip abd and standing hip extension   Consulted and Agree with Plan of Care Patient      Patient will benefit from skilled  therapeutic intervention in order to improve the following deficits and impairments:  Abnormal gait, Decreased balance, Decreased endurance, Difficulty walking, Decreased range of motion, Decreased strength, Pain, Impaired sensation  Visit Diagnosis: Muscle weakness (generalized)  Difficulty in walking, not elsewhere classified  Problem List Patient Active Problem List   Diagnosis Date Noted  . Osteoporosis 08/02/2016  . History of colon polyps 08/02/2016  . Low back pain 06/20/2016  . S/P total knee arthroplasty 03/01/2016  . Knee osteoarthritis 02/16/2016  . Weakness 09/27/2015  . 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 Arthritis of knee, degenerative 05/26/2014  . History of CVA (cerebrovascular accident) 02/16/2014  . Seizures ([place]) 02/16/2014  . Chronic leg pain 02/16/2014  . Fatigue 07/02/2013  . HYPERTENSION, BENIGN 12/07/2010  . ATRIAL FIBRILLATION 06/29/2009  . SHORTNESS OF BREATH 06/29/2009   Alanson Puls, PT, DPT [place], Minette Headland S 11/15/2016, 10:45 AM  [place] MAIN [facility] SERVICES [address] [place], [place], [address] Phone: [phone]   Fax:  [phone]  Name: [name] [name] MRN: [id number] Date of Birth: 05-13-1941

## 2016-11-22 ENCOUNTER — Ambulatory Visit: Payer: PPO | Admitting: Physical Therapy

## 2016-11-22 ENCOUNTER — Encounter: Payer: Self-pay | Admitting: Physical Therapy

## 2016-11-22 DIAGNOSIS — R262 Difficulty in walking, not elsewhere classified: Secondary | ICD-10-CM

## 2016-11-22 DIAGNOSIS — M6281 Muscle weakness (generalized): Secondary | ICD-10-CM | POA: Diagnosis not present

## 2016-11-22 NOTE — Therapy (Signed)
[place] MAIN [facility] SERVICES [address] [place], [place], [address] Phone: [phone]   Fax:  [phone]  Physical Therapy Treatment/Progress Note  Patient Details  Name: [name] [name] MRN: [id number] Date of Birth: 1941-07-23 Referring Provider: Vladimir Crofts  Encounter Date: 11/22/2016      PT End of Session - 11/22/16 1018    Visit Number 5   Number of Visits 17   Date for PT Re-Evaluation 12/14/16   Authorization Type g codes   Authorization Time Period 5/10   PT Start Time 1005   PT Stop Time 1045   PT Time Calculation (min) 40 min   Equipment Utilized During Treatment Gait belt   Activity Tolerance Patient tolerated treatment well;Patient limited by fatigue;Patient limited by pain   Behavior During Therapy [facility] for tasks assessed/performed      Past Medical History:  Diagnosis Date  . Arthritis    osteo; in B knees;   . Chronic a-fib ([place])   . DVT (deep venous thrombosis) ([place])    H/O  . Dysrhythmia    a fib  . Edema   . Heart murmur   . Hypertension    controlled  . Seizure ([place])    2 years ago knocked unconscious; hospitalized and diagnosed with seizures; no spells in last year;   . Stroke [facility])    1 month ago (possible stroke)    Past Surgical History:  Procedure Laterality Date  . ablation  2007   [place], [place]  . bilateral wrist surgery  2003  . BREAST BIOPSY Left    stereo  . BREAST SURGERY    . CATARACT EXTRACTION W/PHACO Right 06/01/2015   Procedure: CATARACT EXTRACTION PHACO AND INTRAOCULAR LENS PLACEMENT (IOC);  Surgeon: Birder Robson, MD;  Location: [facility] ORS;  Service: Ophthalmology;  Laterality: Right;  [place]: 01:02.0 AP%: 23.1 CDE: 14.33 Fluid lot# [id number]   . CATARACT EXTRACTION W/PHACO Left 06/22/2015   Procedure: CATARACT EXTRACTION PHACO AND INTRAOCULAR LENS PLACEMENT (IOC);  Surgeon: Birder Robson, MD;  Location: [facility] ORS;  Service: Ophthalmology;  Laterality: Left;  [place]:  01:00.7 AP%: 23.1 CDE: 14.01 Lot # [id number]  . COLONOSCOPY    . KNEE ARTHROPLASTY Right 03/01/2016   Procedure: COMPUTER ASSISTED TOTAL KNEE ARTHROPLASTY;  Surgeon: Dereck Leep, MD;  Location: [facility] ORS;  Service: Orthopedics;  Laterality: Right;  . TONSILLECTOMY    . TONSILLECTOMY      There were no vitals filed for this visit.      Subjective Assessment - 11/22/16 1016    Subjective Patient reports that she is doing her HEP more regularly. She is having back pain today 5/10. She was having pain in her knees and now she is having pain in her back.    Pertinent History Chronic back pain, R TKR, Bilateral wrist surgery, CVA, a-fib   Limitations Standing;Walking;House hold activities   Patient Stated Goals Patient wants to be able to walk without AD   Currently in Pain? Yes   Pain Score 5    Pain Location Back   Pain Orientation Right   Pain Descriptors / Indicators Aching   Pain Type Acute pain   Pain Onset Today   Aggravating Factors  different things aggravate it, If she sits down too hard   Pain Relieving Factors ice   Multiple Pain Sites No      OUTCOME MEASURES: TEST Outcome Interpretation  5 times sit<>stand 16.4sec >93 yo, >15 sec indicates increased risk for  falls  10 meter walk test     . 83            m/s <1.0 m/s indicates increased risk for falls; limited community ambulator  Timed up and Go   16.15              sec <14 sec indicates increased risk for falls  6 minute walk test  620           Feet 1000 feet is community ambulator     Therapeutic Exercise:  Sit<>stand 2x15 with RTB around knees for glute activation and cues to slow down for improved control, pt reports fatigue during last 5 reps of each set  Seated theraband around ankles and LAQ x 20 x 2 BLE Clam with t-band around knees x 20 x 2 left and right sidelying abd x 10 x 2 BLE and cues for posture correction Step ups sideways with CGA x 10 x 2 CGA and Min  verbal cues used throughout.  Continues  to have balance deficits typical with diagnosis. Patient has wide base of support with ambulation during 6 MW test.                         PT Education - 11/22/16 1018    Education provided Yes   Education Details benefit of exercise and ice   Person(s) Educated Patient   Methods Explanation   Comprehension Verbalized understanding             PT Long Term Goals - 11/22/16 1032      PT LONG TERM GOAL #1   Title Patient will be independent in home exercise program to improve strength/mobility for better functional independence with ADLs by 11/29/16   Time 8   Period Weeks   Status On-going     PT LONG TERM GOAL #2   Title Patient (> 74 years old) will complete five times sit to stand test in < 15 seconds indicating an increased LE strength and improved balance by 11/29/16   Baseline 16.02 sec   Time 8   Period Weeks   Status Partially Met     PT LONG TERM GOAL #3   Title Patient will increase six minute walk test distance to >1000 for progression to community ambulator and improve gait ability by 11/29/16   Baseline 620   Time 8   Period Weeks   Status Partially Met     PT LONG TERM GOAL #4   Title Patient will increase 10 meter walk test to >1.63ms as to improve gait speed for better community ambulation and to reduce fall risk by 11/29/16   Baseline .83   Time 8   Period Weeks   Status Partially Met     PT LONG TERM GOAL #5   Title Patient will tolerate 5 seconds of single leg stance without loss of balance to improve ability to get in and out of shower safely by 11/29/16   Baseline 7 sec   Time 8   Period Weeks   Status On-going               Plan - 11/22/16 1050    Clinical Impression Statement As demonstrated by her TUG and 5 x sit to stand,  pt has improved her balance since her initial evaluation.  She does, however, continue to demonstrate imbalance with SLS and tandem stance. Her 6 MW test score did show a significant change  and she   will benefit from continued skilled PT interventions with  focus on strengthening exercises in combination with balance exercises.   Rehab Potential Fair   Clinical Impairments Affecting Rehab Potential pain in RLE down to foot intermittently, pain across back   PT Frequency 2x / week   PT Duration 8 weeks   PT Treatment/Interventions Aquatic Therapy;Cryotherapy;Moist Heat;Gait training;Therapeutic exercise;Balance training;Neuromuscular re-education;Passive range of motion   PT Next Visit Plan balance trainng and strengthening of LE and core   PT Home Exercise Plan standing hip abd and standing hip extension   Consulted and Agree with Plan of Care Patient      Patient will benefit from skilled therapeutic intervention in order to improve the following deficits and impairments:  Abnormal gait, Decreased balance, Decreased endurance, Difficulty walking, Decreased range of motion, Decreased strength, Pain, Impaired sensation  Visit Diagnosis: Muscle weakness (generalized)  Difficulty in walking, not elsewhere classified       G-Codes - Dec 18, 2016 1053    Functional Assessment Tool Used TUG, 10 MW, 6 MW, 5 x sit to stand   Functional Limitation Mobility: Walking and moving around   Mobility: Walking and Moving Around Current Status [phone]) At least 60 percent but less than 80 percent impaired, limited or restricted   Mobility: Walking and Moving Around Goal Status [phone]) At least 20 percent but less than 40 percent impaired, limited or restricted      Problem List Patient Active Problem List   Diagnosis Date Noted  . Osteoporosis 08/02/2016  . History of colon polyps 08/02/2016  . Low back pain 06/20/2016  . S/P total knee arthroplasty 03/01/2016  . Knee osteoarthritis 02/16/2016  . Weakness 09/27/2015  . 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 Arthritis of knee, degenerative 05/26/2014  . History of CVA (cerebrovascular accident) 02/16/2014  . Seizures  ([place]) 02/16/2014  . Chronic leg pain 02/16/2014  . Fatigue 07/02/2013  . HYPERTENSION, BENIGN 12/07/2010  . ATRIAL FIBRILLATION 06/29/2009  . SHORTNESS OF BREATH 06/29/2009   Alanson Puls, PT, DPT [place], Minette Headland S 12/18/2016, 3:18 PM  [place] MAIN [facility] SERVICES [address] [place], [place], [address] Phone: [phone]   Fax:  [phone]  Name: [name] [name] MRN: [id number] Date of Birth: 1941/09/03

## 2016-11-22 NOTE — Patient Instructions (Addendum)
Abduction Lift    Lie on residual limb side. Tighten muscles on outside of hip to raise sound limb ____ inches. Hold ____ seconds. Repeat ____ times. Do ____ sessions per day.  Copyright  VHI. All rights reserved.  Abduction: Clam (Eccentric) - Side-Lying    Lie on side with knees bent. Lift top knee, keeping feet together. Keep trunk steady. Slowly lower for 3-5 seconds. ___ reps per set, ___ sets per day, ___ days per week. Add ___ lbs when you achieve ___ repetitions.  http://ecce.exer.us/65   Copyright  VHI. All rights reserved.  Abduction: Side Leg Lift (Eccentric) - Side-Lying    Lie on side. Lift top leg slightly higher than shoulder level. Keep top leg straight with body, toes pointing forward. Slowly lower for 3-5 seconds. ___ reps per set, ___ sets per day, ___ days per week. Add ___ lbs when you achieve ___ repetitions.  http://ecce.exer.us/63   Copyright  VHI. All rights reserved.  Gait: Weight Shift    Support child at hips. Place left foot on step or phone book. Shift child's weight over. No toe walking. Go slowly, allow child to respond. Repeat ____ times. Do ____ sessions per day.  Copyright  VHI. All rights reserved.  (Clinic) Knee: Extension - Sitting    Pulley behind, sit with knee over edge of bench. Lift leg, straighten knee. Repeat ____ times per set. Do ____ sets per session. Do ____ sessions per week. Use ____ lb weights.   Copyright  VHI. All rights reserved.

## 2016-11-27 ENCOUNTER — Encounter: Payer: PPO | Admitting: Physical Therapy

## 2016-11-29 ENCOUNTER — Encounter: Payer: PPO | Admitting: Physical Therapy

## 2016-11-30 ENCOUNTER — Encounter: Payer: Self-pay | Admitting: Physical Therapy

## 2016-11-30 ENCOUNTER — Ambulatory Visit: Payer: PPO | Attending: Neurology | Admitting: Physical Therapy

## 2016-11-30 DIAGNOSIS — R262 Difficulty in walking, not elsewhere classified: Secondary | ICD-10-CM | POA: Insufficient documentation

## 2016-11-30 DIAGNOSIS — M6281 Muscle weakness (generalized): Secondary | ICD-10-CM | POA: Insufficient documentation

## 2016-11-30 NOTE — Addendum Note (Signed)
Addended by: Alanson Puls on: 11/30/2016 11:45 AM   Modules accepted: Orders

## 2016-11-30 NOTE — Therapy (Addendum)
[place] MAIN [facility] SERVICES [address] [place], [place], [address] Phone: [phone]   Fax:  [phone]  Physical Therapy Treatment  Patient Details  Name: [name] [name] MRN: [id number] Date of Birth: [birth year] Referring Provider: Vladimir Crofts  Encounter Date: 11/30/2016      PT End of Session - 11/30/16 1123    Visit Number 6   Number of Visits 17   Date for PT Re-Evaluation 2017/02/03   Authorization Type g codes   Authorization Time Period 6/10   PT Start Time 1115   PT Stop Time 1200   PT Time Calculation (min) 45 min   Equipment Utilized During Treatment Gait belt   Activity Tolerance Patient tolerated treatment well;Patient limited by fatigue;Patient limited by pain   Behavior During Therapy [facility] for tasks assessed/performed      Past Medical History:  Diagnosis Date  . Arthritis    osteo; in B knees;   . Chronic a-fib ([place])   . DVT (deep venous thrombosis) ([place])    H/O  . Dysrhythmia    a fib  . Edema   . Heart murmur   . Hypertension    controlled  . Seizure ([place])    2 years ago knocked unconscious; hospitalized and diagnosed with seizures; no spells in last year;   . Stroke [facility])    1 month ago (possible stroke)    Past Surgical History:  Procedure Laterality Date  . ablation  2007   [place], [place]  . bilateral wrist surgery  2003  . BREAST BIOPSY Left    stereo  . BREAST SURGERY    . CATARACT EXTRACTION W/PHACO Right 06/01/2015   Procedure: CATARACT EXTRACTION PHACO AND INTRAOCULAR LENS PLACEMENT (IOC);  Surgeon: Birder Robson, MD;  Location: [facility] ORS;  Service: Ophthalmology;  Laterality: Right;  [place]: 01:02.0 AP%: 23.1 CDE: 14.33 Fluid lot# [id number]   . CATARACT EXTRACTION W/PHACO Left 06/22/2015   Procedure: CATARACT EXTRACTION PHACO AND INTRAOCULAR LENS PLACEMENT (IOC);  Surgeon: Birder Robson, MD;  Location: [facility] ORS;  Service: Ophthalmology;  Laterality: Left;  [place]: 01:00.7 AP%:  23.1 CDE: 14.01 Lot # [id number]  . COLONOSCOPY    . KNEE ARTHROPLASTY Right 03/01/2016   Procedure: COMPUTER ASSISTED TOTAL KNEE ARTHROPLASTY;  Surgeon: Dereck Leep, MD;  Location: [facility] ORS;  Service: Orthopedics;  Laterality: Right;  . TONSILLECTOMY    . TONSILLECTOMY      There were no vitals filed for this visit.      Subjective Assessment - 11/30/16 1121    Subjective Patient reports that she feels tired and she cant seem to get going.    Pertinent History Chronic back pain, R TKR, Bilateral wrist surgery, CVA, a-fib   Limitations Standing;Walking;House hold activities   Patient Stated Goals Patient wants to be able to walk without AD   Currently in Pain? No/denies   Pain Score 0-No pain   Pain Onset Today   Multiple Pain Sites No      NMR: Tapping 6 inch step BLE x 20, CGA Side tapping 6 inch step BLE x 20, CGA Step ups to 6 inch stool x 20 , CGA Rocker board fwd bwd x 2 mins Side stepping in parallel bars without UE support and CGA Standing on foam and reaching across midline with cones Walking on TM . 4 m/hr without UE X 5 MINS   Therapeutic exercise; Hip abd/ext/flex with RTB x 15 x 2 Leg press x 20 x 3  90 lbs, heel raises x 15 with 45 lbs  Patient needs occasional verbal cueing to improve posture and cueing to correctly perform exercises slowly,and cues to weight shift fwd                            PT Education - 11/30/16 1121    Education provided Yes   Education Details HEP   Person(s) Educated Patient   Methods Explanation   Comprehension Verbalized understanding             PT Long Term Goals - 11/30/16 1126      PT LONG TERM GOAL #1   Title Patient will be independent in home exercise program to improve strength/mobility for better functional independence with ADLs by 11/29/16   Time 8   Period Weeks   Status On-going     PT LONG TERM GOAL #2   Title Patient (> 75 years old) will complete five times sit to stand  test in < 15 seconds indicating an increased LE strength and improved balance by 11/29/16   Baseline 16.02 sec   Time 8   Period Weeks   Status Partially Met     PT LONG TERM GOAL #3   Title Patient will increase six minute walk test distance to >1000 for progression to community ambulator and improve gait ability by 11/29/16   Baseline 620   Time 8   Period Weeks   Status Partially Met     PT LONG TERM GOAL #4   Title Patient will increase 10 meter walk test to >1.50ms as to improve gait speed for better community ambulation and to reduce fall risk by 11/29/16   Baseline .83   Time 8   Period Weeks   Status Partially Met     PT LONG TERM GOAL #5   Title Patient will tolerate 5 seconds of single leg stance without loss of balance to improve ability to get in and out of shower safely by 11/29/16   Baseline 7 sec   Time 8   Period Weeks   Status On-going               Plan - 11/30/16 1125    Clinical Impression Statement Patient instructed in open chain and closed chain exercises for  strengthening and dynamic standing balance exercises with challenges for single leg stand and weight shifting.  As demonstrated by her TUG and 5 x sit to stand,  Patient has improved her balance since her initial evaluation.  She does, however, continue to demonstrate imbalance with SLS and tandem stance. Her 6 MW test score did show a significant change and she  will benefit from continued skilled PT interventions with  focus on strengthening exercises in combination with balance exercises.   Rehab Potential Fair   Clinical Impairments Affecting Rehab Potential pain in RLE down to foot intermittently, pain across back   PT Frequency 1x / week   PT Duration 8 weeks   PT Treatment/Interventions Aquatic Therapy;Cryotherapy;Moist Heat;Gait training;Therapeutic exercise;Balance training;Neuromuscular re-education;Passive range of motion   PT Next Visit Plan balance trainng and strengthening of LE and core    PT Home Exercise Plan standing hip abd and standing hip extension   Consulted and Agree with Plan of Care Patient      Patient will benefit from skilled therapeutic intervention in order to improve the following deficits and impairments:  Abnormal gait, Decreased balance, Decreased endurance, Difficulty walking,  Decreased range of motion, Decreased strength, Pain, Impaired sensation  Visit Diagnosis: Muscle weakness (generalized)  Difficulty in walking, not elsewhere classified       G-Codes - 2016-12-07 1127    Functional Assessment Tool Used TUG, 10 MW, 6 MW, 5 x sit to stand   Functional Limitation Mobility: Walking and moving around   Mobility: Walking and Moving Around Current Status [phone]) At least 40 percent but less than 60 percent impaired, limited or restricted   Mobility: Walking and Moving Around Goal Status [phone]) At least 20 percent but less than 40 percent impaired, limited or restricted      Problem List Patient Active Problem List   Diagnosis Date Noted  . Osteoporosis 08/02/2016  . History of colon polyps 08/02/2016  . Low back pain 06/20/2016  . S/P total knee arthroplasty 03/01/2016  . Knee osteoarthritis 02/16/2016  . Weakness 09/27/2015  . 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 Arthritis of knee, degenerative 05/26/2014  . History of CVA (cerebrovascular accident) 02/16/2014  . Seizures ([place]) 02/16/2014  . Chronic leg pain 02/16/2014  . Fatigue 07/02/2013  . HYPERTENSION, BENIGN 12/07/2010  . ATRIAL FIBRILLATION 06/29/2009  . SHORTNESS OF BREATH 06/29/2009  Alanson Puls, PT, DPT [place], Minette Headland S 07-Dec-2016, 11:29 AM  [place] MAIN [facility] SERVICES [address] [place], [place], [address] Phone: [phone]   Fax:  [phone]  Name: [name] [name] MRN: [id number] Date of Birth: 1941-01-03

## 2016-12-05 ENCOUNTER — Encounter: Payer: PPO | Admitting: Physical Therapy

## 2016-12-05 ENCOUNTER — Encounter: Payer: Self-pay | Admitting: Physical Therapy

## 2016-12-05 ENCOUNTER — Ambulatory Visit: Payer: PPO | Admitting: Physical Therapy

## 2016-12-05 DIAGNOSIS — M6281 Muscle weakness (generalized): Secondary | ICD-10-CM

## 2016-12-05 DIAGNOSIS — R262 Difficulty in walking, not elsewhere classified: Secondary | ICD-10-CM

## 2016-12-05 NOTE — Therapy (Signed)
[place] MAIN [facility] SERVICES [address] [place], [place], [address] Phone: [phone]   Fax:  [phone]  Physical Therapy Treatment  Patient Details  Name: [name] [name] MRN: [id number] Date of Birth: 02-Feb-1941 Referring Provider: Vladimir Crofts  Encounter Date: 12/05/2016      PT End of Session - 12/05/16 1040    Visit Number 7   Number of Visits 17   Date for PT Re-Evaluation 02/11/17   Authorization Type g codes   Authorization Time Period 7/10   PT Start Time 1030   PT Stop Time 1110   PT Time Calculation (min) 40 min   Equipment Utilized During Treatment Gait belt   Activity Tolerance Patient tolerated treatment well;Patient limited by fatigue;Patient limited by pain   Behavior During Therapy [facility] for tasks assessed/performed      Past Medical History:  Diagnosis Date  . Arthritis    osteo; in B knees;   . Chronic a-fib ([place])   . DVT (deep venous thrombosis) ([place])    H/O  . Dysrhythmia    a fib  . Edema   . Heart murmur   . Hypertension    controlled  . Seizure ([place])    2 years ago knocked unconscious; hospitalized and diagnosed with seizures; no spells in last year;   . Stroke [facility])    1 month ago (possible stroke)    Past Surgical History:  Procedure Laterality Date  . ablation  2007   [place], [place]  . bilateral wrist surgery  2003  . BREAST BIOPSY Left    stereo  . BREAST SURGERY    . CATARACT EXTRACTION W/PHACO Right 06/01/2015   Procedure: CATARACT EXTRACTION PHACO AND INTRAOCULAR LENS PLACEMENT (IOC);  Surgeon: Birder Robson, MD;  Location: [facility] ORS;  Service: Ophthalmology;  Laterality: Right;  [place]: 01:02.0 AP%: 23.1 CDE: 14.33 Fluid lot# [id number]   . CATARACT EXTRACTION W/PHACO Left 06/22/2015   Procedure: CATARACT EXTRACTION PHACO AND INTRAOCULAR LENS PLACEMENT (IOC);  Surgeon: Birder Robson, MD;  Location: [facility] ORS;  Service: Ophthalmology;  Laterality: Left;  [place]: 01:00.7 AP%:  23.1 CDE: 14.01 Lot # [id number]  . COLONOSCOPY    . KNEE ARTHROPLASTY Right 03/01/2016   Procedure: COMPUTER ASSISTED TOTAL KNEE ARTHROPLASTY;  Surgeon: Dereck Leep, MD;  Location: [facility] ORS;  Service: Orthopedics;  Laterality: Right;  . TONSILLECTOMY    . TONSILLECTOMY      There were no vitals filed for this visit.      Subjective Assessment - 12/05/16 1039    Subjective Patient reports that she feels tired and she cant seem to get going. She is having 5/10 pain in her right side of her back.    Pertinent History Chronic back pain, R TKR, Bilateral wrist surgery, CVA, a-fib   Limitations Standing;Walking;House hold activities   Patient Stated Goals Patient wants to be able to walk without AD   Currently in Pain? Yes   Pain Score 5    Pain Location Back   Pain Orientation Right   Pain Type Acute pain   Pain Onset Today   Multiple Pain Sites Yes  left knee 2/5      NEUROMUSCULAR RE-EDUCATION   Toe tapping 6 inch stool without UE assist Tandem gait in // bars x 4 laps  Side stepping on blue foam balance beam x 5 lengths of the parallel bars  Standing on foam NBOS sorting balls/ sorting shapes reaching   Therapeutic exercise Quantum double  leg press 105# x 10, 120# x 10, 135# x 10; Matrix resisted gait forward, retro x 5 each, and lateral x 3 each direction all 12.5# tried 17.5# but too much; Sit to stand without UE support x 10;   Patient needs occasional verbal cueing to improve posture and cueing to correctly perform exercises slowly, holding at end of range to increase motor firing of desired muscle to encourage fatigue.                          PT Education - 12/05/16 1040    Education provided Yes   Education Details HEP   Person(s) Educated Patient   Methods Explanation   Comprehension Verbalized understanding             PT Long Term Goals - 11/30/16 1126      PT LONG TERM GOAL #1   Title Patient will be independent in home  exercise program to improve strength/mobility for better functional independence with ADLs by 11/29/16   Time 8   Period Weeks   Status On-going     PT LONG TERM GOAL #2   Title Patient (76 years old) will complete five times sit to stand test in < 15 seconds indicating an increased LE strength and improved balance by 11/29/16   Baseline 16.02 sec   Time 8   Period Weeks   Status Partially Met     PT LONG TERM GOAL #3   Title Patient will increase six minute walk test distance to >1000 for progression to community ambulator and improve gait ability by 11/29/16   Baseline 620   Time 8   Period Weeks   Status Partially Met     PT LONG TERM GOAL #4   Title Patient will increase 10 meter walk test to >1.5ms as to improve gait speed for better community ambulation and to reduce fall risk by 11/29/16   Baseline .83   Time 8   Period Weeks   Status Partially Met     PT LONG TERM GOAL #5   Title Patient will tolerate 5 seconds of single leg stance without loss of balance to improve ability to get in and out of shower safely by 11/29/16   Baseline 7 sec   Time 8   Period Weeks   Status On-going               Plan - 12/05/16 1040    Clinical Impression Statement Despite reporting soreness in patients back patient tolerated all interventions well this session without an increase in pain.  Patient demonstrates imbalance with stepping activities and when balancing on uneven surfaces.  Patient will benefit from continued skilled PT interventions for improved strengthening and balance and decrease patients risk of falling.   Rehab Potential Fair   Clinical Impairments Affecting Rehab Potential pain in RLE down to foot intermittently, pain across back   PT Frequency 1x / week   PT Duration 8 weeks   PT Treatment/Interventions Aquatic Therapy;Cryotherapy;Moist Heat;Gait training;Therapeutic exercise;Balance training;Neuromuscular re-education;Passive range of motion   PT Next Visit Plan  balance trainng and strengthening of LE and core   PT Home Exercise Plan standing hip abd and standing hip extension   Consulted and Agree with Plan of Care Patient      Patient will benefit from skilled therapeutic intervention in order to improve the following deficits and impairments:  Abnormal gait, Decreased balance, Decreased endurance, Difficulty walking, Decreased  range of motion, Decreased strength, Pain, Impaired sensation  Visit Diagnosis: Muscle weakness (generalized)  Difficulty in walking, not elsewhere classified     Problem List Patient Active Problem List   Diagnosis Date Noted  . Osteoporosis 08/02/2016  . History of colon polyps 08/02/2016  . Low back pain 06/20/2016  . S/P total knee arthroplasty 03/01/2016  . Knee osteoarthritis 02/16/2016  . Weakness 09/27/2015  . 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 Arthritis of knee, degenerative 05/26/2014  . History of CVA (cerebrovascular accident) 02/16/2014  . Seizures ([place]) 02/16/2014  . Chronic leg pain 02/16/2014  . Fatigue 07/02/2013  . HYPERTENSION, BENIGN 12/07/2010  . ATRIAL FIBRILLATION 06/29/2009  . SHORTNESS OF BREATH 06/29/2009   Alanson Puls, PT, DPT [place], [place] S 12/05/2016, 11:16 AM  [place] MAIN [facility] SERVICES [address] [place], [place], [address] Phone: [phone]   Fax:  [phone]  Name: ANNICA MARINELLO MRN: [id number] Date of Birth: 05-Nov-1940

## 2016-12-06 ENCOUNTER — Encounter: Payer: PPO | Admitting: Physical Therapy

## 2016-12-07 ENCOUNTER — Encounter: Payer: PPO | Admitting: Physical Therapy

## 2016-12-07 DIAGNOSIS — M5136 Other intervertebral disc degeneration, lumbar region: Secondary | ICD-10-CM | POA: Diagnosis not present

## 2016-12-07 DIAGNOSIS — M5416 Radiculopathy, lumbar region: Secondary | ICD-10-CM | POA: Diagnosis not present

## 2016-12-07 DIAGNOSIS — M48062 Spinal stenosis, lumbar region with neurogenic claudication: Secondary | ICD-10-CM | POA: Diagnosis not present

## 2016-12-13 ENCOUNTER — Ambulatory Visit: Payer: PPO | Admitting: Physical Therapy

## 2016-12-13 ENCOUNTER — Encounter: Payer: PPO | Admitting: Physical Therapy

## 2016-12-13 ENCOUNTER — Encounter: Payer: Self-pay | Admitting: Physical Therapy

## 2016-12-13 DIAGNOSIS — M6281 Muscle weakness (generalized): Secondary | ICD-10-CM

## 2016-12-13 DIAGNOSIS — R262 Difficulty in walking, not elsewhere classified: Secondary | ICD-10-CM

## 2016-12-13 NOTE — Therapy (Signed)
[place] MAIN [facility] SERVICES [address] [place], [place], [address] Phone: [phone]   Fax:  [phone]  Physical Therapy Treatment  Patient Details  Name: [name] [name] MRN: [id number] Date of Birth: [birth year] Referring Provider: Vladimir Crofts  Encounter Date: 12/13/2016      PT End of Session - 12/13/16 1516    Visit Number 8   Number of Visits 17   Date for PT Re-Evaluation 2017-02-16   Authorization Type g codes   Authorization Time Period 8/10   PT Start Time 0305   PT Stop Time 0345   PT Time Calculation (min) 40 min   Equipment Utilized During Treatment Gait belt   Activity Tolerance Patient tolerated treatment well;Patient limited by fatigue;Patient limited by pain   Behavior During Therapy [facility] for tasks assessed/performed      Past Medical History:  Diagnosis Date  . Arthritis    osteo; in B knees;   . Chronic a-fib ([place])   . DVT (deep venous thrombosis) ([place])    H/O  . Dysrhythmia    a fib  . Edema   . Heart murmur   . Hypertension    controlled  . Seizure ([place])    2 years ago knocked unconscious; hospitalized and diagnosed with seizures; no spells in last year;   . Stroke [facility])    1 month ago (possible stroke)    Past Surgical History:  Procedure Laterality Date  . ablation  2007   [place], [place]  . bilateral wrist surgery  2003  . BREAST BIOPSY Left    stereo  . BREAST SURGERY    . CATARACT EXTRACTION W/PHACO Right 06/01/2015   Procedure: CATARACT EXTRACTION PHACO AND INTRAOCULAR LENS PLACEMENT (IOC);  Surgeon: Birder Robson, MD;  Location: [facility] ORS;  Service: Ophthalmology;  Laterality: Right;  [place]: 01:02.0 AP%: 23.1 CDE: 14.33 Fluid lot# [id number]   . CATARACT EXTRACTION W/PHACO Left 06/22/2015   Procedure: CATARACT EXTRACTION PHACO AND INTRAOCULAR LENS PLACEMENT (IOC);  Surgeon: Birder Robson, MD;  Location: [facility] ORS;  Service: Ophthalmology;  Laterality: Left;  [place]: 01:00.7 AP%:  23.1 CDE: 14.01 Lot # [id number]  . COLONOSCOPY    . KNEE ARTHROPLASTY Right 03/01/2016   Procedure: COMPUTER ASSISTED TOTAL KNEE ARTHROPLASTY;  Surgeon: Dereck Leep, MD;  Location: [facility] ORS;  Service: Orthopedics;  Laterality: Right;  . TONSILLECTOMY    . TONSILLECTOMY      There were no vitals filed for this visit.      Subjective Assessment - 12/13/16 1514    Subjective Patient reports that she feels tired and she was dropped off at the wrong area in the hospital and had a long walk in. She had a shot in her right side of her back last tuesday. one week ago.    Pertinent History Chronic back pain, R TKR, Bilateral wrist surgery, CVA, a-fib   Limitations Standing;Walking;House hold activities   Patient Stated Goals Patient wants to be able to walk without AD   Currently in Pain? Yes   Pain Score 3    Pain Location Back   Pain Orientation Right   Pain Descriptors / Indicators Aching   Pain Type Chronic pain   Pain Onset Today   Pain Frequency Intermittent   Multiple Pain Sites No      Therapeutic exercise;  Leg press x 20 x 3 130 lbs, heel raises with 90 lbs x 20 x 3 Heel raises standing  4 way hip RTB  x 20, side steps in // bars with yellow band x 5 laps Neuromuscular training: Toe tapping 6 inch stool without UE assist Tandem gait in // bars x 4 laps  Side stepping on blue foam balance beam x 5 lengths of the parallel bars  Patient needs occasional verbal cueing to improve posture and cueing to correctly perform exercises slowly, with cues for technique.                          PT Education - 12/13/16 1516    Education provided Yes   Education Details HEP   Person(s) Educated Patient   Methods Explanation   Comprehension Verbalized understanding             PT Long Term Goals - 11/30/16 1126      PT LONG TERM GOAL #1   Title Patient will be independent in home exercise program to improve strength/mobility for better functional  independence with ADLs by 11/29/16   Time 8   Period Weeks   Status On-going     PT LONG TERM GOAL #2   Title Patient (> 18 years old) will complete five times sit to stand test in < 15 seconds indicating an increased LE strength and improved balance by 11/29/16   Baseline 16.02 sec   Time 8   Period Weeks   Status Partially Met     PT LONG TERM GOAL #3   Title Patient will increase six minute walk test distance to >1000 for progression to community ambulator and improve gait ability by 11/29/16   Baseline 620   Time 8   Period Weeks   Status Partially Met     PT LONG TERM GOAL #4   Title Patient will increase 10 meter walk test to >1.36ms as to improve gait speed for better community ambulation and to reduce fall risk by 11/29/16   Baseline .83   Time 8   Period Weeks   Status Partially Met     PT LONG TERM GOAL #5   Title Patient will tolerate 5 seconds of single leg stance without loss of balance to improve ability to get in and out of shower safely by 11/29/16   Baseline 7 sec   Time 8   Period Weeks   Status On-going               Plan - 12/13/16 1517    Clinical Impression Statement Patient performed therapeutic exericses and standing balance exericse .  Patient required occasional rest breaks between exercises due to fatigue. Patient tolerated exercise well with minimal increase in back pain 3/10. She continues to have loss of balance with single leg tasks and needs CGA for all balance exercises.  Patient will continue to benefit from skilled therapy in order to improve her dynamic standing balance activities and increase gait speed to reduce her risk for falls   Rehab Potential Fair   Clinical Impairments Affecting Rehab Potential pain in RLE down to foot intermittently, pain across back   PT Frequency 1x / week   PT Duration 8 weeks   PT Treatment/Interventions Aquatic Therapy;Cryotherapy;Moist Heat;Gait training;Therapeutic exercise;Balance training;Neuromuscular  re-education;Passive range of motion   PT Next Visit Plan balance trainng and strengthening of LE and core   PT Home Exercise Plan standing hip abd and standing hip extension   Consulted and Agree with Plan of Care Patient      Patient will benefit from skilled therapeutic  intervention in order to improve the following deficits and impairments:  Abnormal gait, Decreased balance, Decreased endurance, Difficulty walking, Decreased range of motion, Decreased strength, Pain, Impaired sensation  Visit Diagnosis: Muscle weakness (generalized)  Difficulty in walking, not elsewhere classified     Problem List Patient Active Problem List   Diagnosis Date Noted  . Osteoporosis 08/02/2016  . History of colon polyps 08/02/2016  . Low back pain 06/20/2016  . S/P total knee arthroplasty 03/01/2016  . Knee osteoarthritis 02/16/2016  . Weakness 09/27/2015  . 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 Arthritis of knee, degenerative 05/26/2014  . History of CVA (cerebrovascular accident) 02/16/2014  . Seizures ([place]) 02/16/2014  . Chronic leg pain 02/16/2014  . Fatigue 07/02/2013  . HYPERTENSION, BENIGN 12/07/2010  . ATRIAL FIBRILLATION 06/29/2009  . SHORTNESS OF BREATH 06/29/2009   Alanson Puls, PT, DPT [place], [place] S 12/13/2016, 3:24 PM  [place] MAIN [facility] SERVICES [address] [place], [place], [address] Phone: [phone]   Fax:  [phone]  Name: [name] [name] MRN: [id number] Date of Birth: 07-23-1941

## 2016-12-17 IMAGING — DX DG KNEE 1-2V PORT*R*
2 series · 2 of 2 positions shown · non-contrast
Comparison: August 04, 2015.

CLINICAL DATA: Status post right knee replacement.

EXAM:
PORTABLE RIGHT KNEE - 1-2 VIEW

[knee ap]
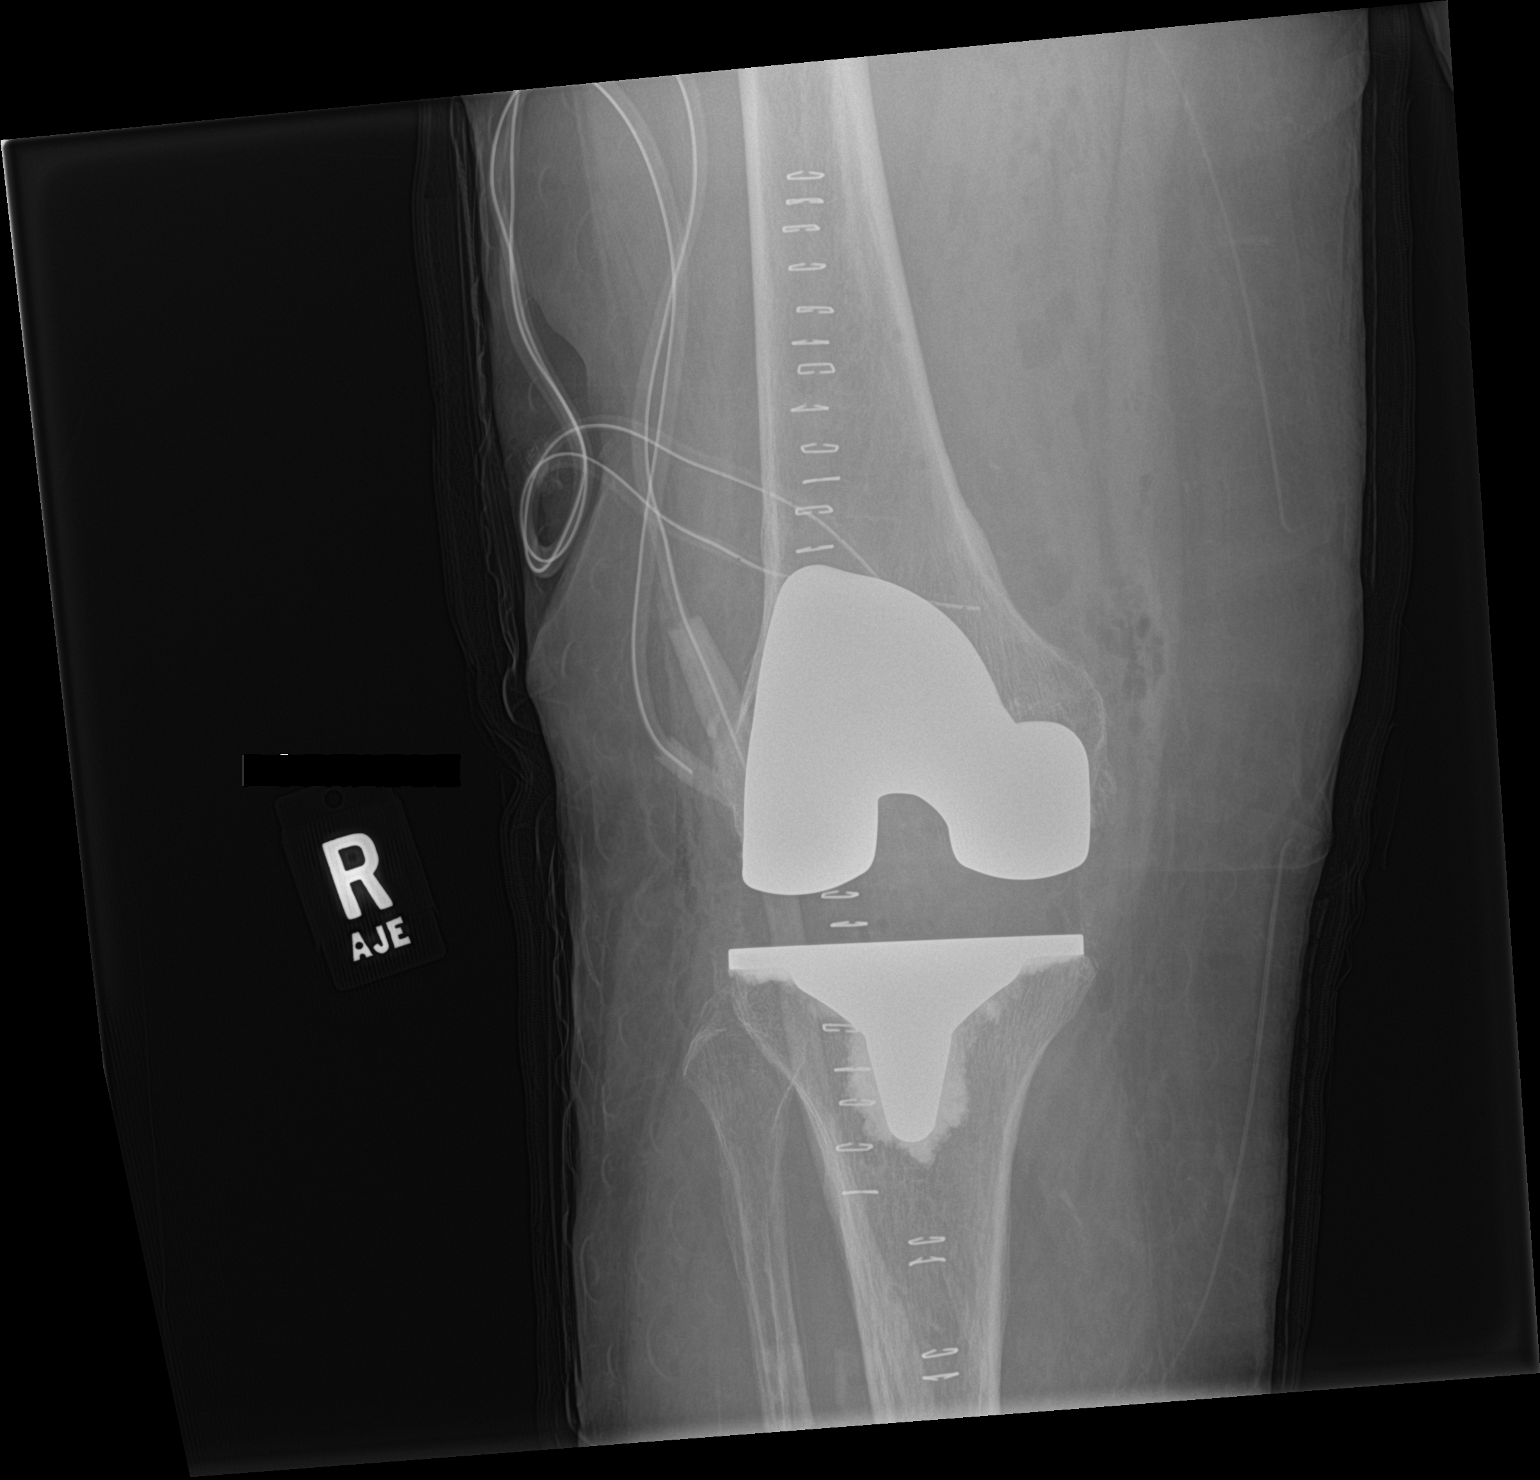

[knee lat]
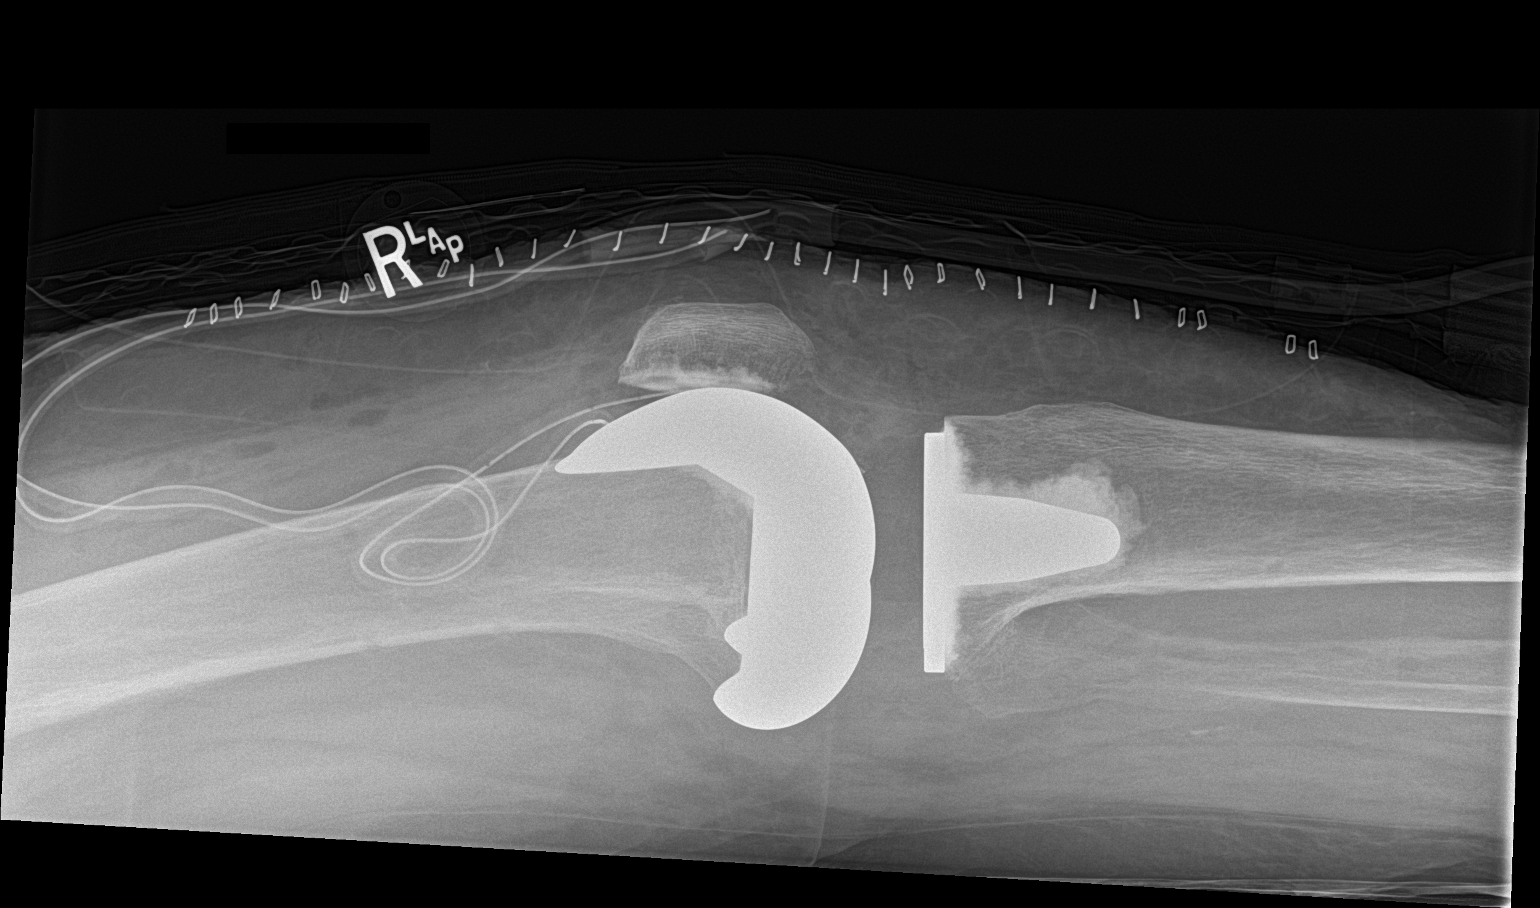

[2 of 2 positions shown; findings below may reference images not displayed]

FINDINGS: The femoral and tibial components appear to be well situated.
Surgical drains are present. No fracture or dislocation is noted.
IMPRESSION: Status post right total knee arthroplasty.

## 2016-12-20 ENCOUNTER — Encounter: Payer: Self-pay | Admitting: Physical Therapy

## 2016-12-20 ENCOUNTER — Ambulatory Visit: Payer: PPO | Admitting: Physical Therapy

## 2016-12-20 ENCOUNTER — Encounter: Payer: PPO | Admitting: Physical Therapy

## 2016-12-20 DIAGNOSIS — M6281 Muscle weakness (generalized): Secondary | ICD-10-CM

## 2016-12-20 DIAGNOSIS — R262 Difficulty in walking, not elsewhere classified: Secondary | ICD-10-CM

## 2016-12-20 NOTE — Therapy (Addendum)
[place] MAIN [facility] SERVICES [address] [place], [place], [address] Phone: [phone]   Fax:  [phone]  Physical Therapy Treatment  Patient Details  Name: [name] [name] MRN: [id number] Date of Birth: 04-26-1941 Referring Provider: Vladimir Crofts  Encounter Date: 12/20/2016      PT End of Session - 12/20/16 1525    Visit Number 9   Number of Visits 17   Date for PT Re-Evaluation 02-Feb-2017   Authorization Type g codes   Authorization Time Period 9/10   PT Start Time 0400   PT Stop Time 0445   PT Time Calculation (min) 45 min   Equipment Utilized During Treatment Gait belt   Activity Tolerance Patient tolerated treatment well;Patient limited by fatigue;Patient limited by pain   Behavior During Therapy [facility] for tasks assessed/performed      Past Medical History:  Diagnosis Date  . Arthritis    osteo; in B knees;   . Chronic a-fib ([place])   . DVT (deep venous thrombosis) ([place])    H/O  . Dysrhythmia    a fib  . Edema   . Heart murmur   . Hypertension    controlled  . Seizure ([place])    2 years ago knocked unconscious; hospitalized and diagnosed with seizures; no spells in last year;   . Stroke [facility])    1 month ago (possible stroke)    Past Surgical History:  Procedure Laterality Date  . ablation  2007   [place], [place]  . bilateral wrist surgery  2003  . BREAST BIOPSY Left    stereo  . BREAST SURGERY    . CATARACT EXTRACTION W/PHACO Right 06/01/2015   Procedure: CATARACT EXTRACTION PHACO AND INTRAOCULAR LENS PLACEMENT (IOC);  Surgeon: Birder Robson, MD;  Location: [facility] ORS;  Service: Ophthalmology;  Laterality: Right;  [place]: 01:02.0 AP%: 23.1 CDE: 14.33 Fluid lot# [id number]   . CATARACT EXTRACTION W/PHACO Left 06/22/2015   Procedure: CATARACT EXTRACTION PHACO AND INTRAOCULAR LENS PLACEMENT (IOC);  Surgeon: Birder Robson, MD;  Location: [facility] ORS;  Service: Ophthalmology;  Laterality: Left;  [place]: 01:00.7 AP%:  23.1 CDE: 14.01 Lot # [id number]  . COLONOSCOPY    . KNEE ARTHROPLASTY Right 03/01/2016   Procedure: COMPUTER ASSISTED TOTAL KNEE ARTHROPLASTY;  Surgeon: Dereck Leep, MD;  Location: [facility] ORS;  Service: Orthopedics;  Laterality: Right;  . TONSILLECTOMY    . TONSILLECTOMY      There were no vitals filed for this visit.      Subjective Assessment - 12/20/16 1525    Subjective Patient reports that she fell down yesterday and she is doing fine. She feel like she is getting better with her walking but does have fatigue. She thinks that her balance is better and doesn't know exactly why she fell.    Pertinent History Chronic back pain, R TKR, Bilateral wrist surgery, CVA, a-fib   Limitations Standing;Walking;House hold activities   Patient Stated Goals Patient wants to be able to walk without AD   Currently in Pain? No/denies   Pain Score 0-No pain   Pain Onset Today     Neuromuscular training:  Tandem stand with head turns x 2 minutes Stepping onto AIREX, then on to 4 inch step followed by stepping down onto AIREX and then level surface in //bars x15.  Pt required occasional UE assist, and performance improved with each repetition   Stepping over 1/2 foam  and back x10 bilaterally with Verbal cuing  Side step and  back over 1/2 foam  x10 bilaterally   Therapeutic exercise: Leg press 100 lbs x 20 x 3, heel raises 100 lbs 20 x 3 Step ups x 6 inch stool x 20    CGA and Min to mod verbal cues used throughout with increased in postural sway and LOB most seen with narrow base of support and while on uneven surfaces. Continues to have balance deficits  . Patient performs intermediate level exercises without pain behaviors and needs verbal cuing for postural alignment and head positioning T                       PT Education - 12/20/16 1525    Education provided Yes   Education Details progress HEP   Person(s) Educated Patient   Methods Explanation   Comprehension  Verbalized understanding             PT Long Term Goals - 11/30/16 1126      PT LONG TERM GOAL #1   Title Patient will be independent in home exercise program to improve strength/mobility for better functional independence with ADLs by 11/29/16   Time 8   Period Weeks   Status On-going     PT LONG TERM GOAL #2   Title Patient (> 79 years old) will complete five times sit to stand test in < 15 seconds indicating an increased LE strength and improved balance by 11/29/16   Baseline 16.02 sec   Time 8   Period Weeks   Status Partially Met     PT LONG TERM GOAL #3   Title Patient will increase six minute walk test distance to >1000 for progression to community ambulator and improve gait ability by 11/29/16   Baseline 620   Time 8   Period Weeks   Status Partially Met     PT LONG TERM GOAL #4   Title Patient will increase 10 meter walk test to >1.82ms as to improve gait speed for better community ambulation and to reduce fall risk by 11/29/16   Baseline .83   Time 8   Period Weeks   Status Partially Met     PT LONG TERM GOAL #5   Title Patient will tolerate 5 seconds of single leg stance without loss of balance to improve ability to get in and out of shower safely by 11/29/16   Baseline 7 sec   Time 8   Period Weeks   Status On-going               Plan - 12/20/16 1526    Clinical Impression Statement Patient instructed in advanced strengthening and balance exercise. She requires min Vcs for correct exercise technique including to improve trunk control with standing exercise. Patient demonstrates better quad control with SLS tasks with rail assist. She would benefit from additional skilled PT intervention to improve balance/gait safety and reduce fall risk;   Rehab Potential Fair   Clinical Impairments Affecting Rehab Potential pain in RLE down to foot intermittently, pain across back   PT Frequency 1x / week   PT Duration 8 weeks   PT Treatment/Interventions Aquatic  Therapy;Cryotherapy;Moist Heat;Gait training;Therapeutic exercise;Balance training;Neuromuscular re-education;Passive range of motion   PT Next Visit Plan balance trainng and strengthening of LE and core   PT Home Exercise Plan standing hip abd and standing hip extension   Consulted and Agree with Plan of Care Patient      Patient will benefit from skilled therapeutic intervention in order  to improve the following deficits and impairments:  Abnormal gait, Decreased balance, Decreased endurance, Difficulty walking, Decreased range of motion, Decreased strength, Pain, Impaired sensation  Visit Diagnosis: Muscle weakness (generalized)  Difficulty in walking, not elsewhere classified     Problem List Patient Active Problem List   Diagnosis Date Noted  . Osteoporosis 08/02/2016  . History of colon polyps 08/02/2016  . Low back pain 06/20/2016  . S/P total knee arthroplasty 03/01/2016  . Knee osteoarthritis 02/16/2016  . Weakness 09/27/2015  . 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 Arthritis of knee, degenerative 05/26/2014  . History of CVA (cerebrovascular accident) 02/16/2014  . Seizures ([place]) 02/16/2014  . Chronic leg pain 02/16/2014  . Fatigue 07/02/2013  . HYPERTENSION, BENIGN 12/07/2010  . ATRIAL FIBRILLATION 06/29/2009  . SHORTNESS OF BREATH 06/29/2009   Alanson Puls, PT, DPT [place], [place] S 12/20/2016, 3:29 PM  [place] MAIN [facility] SERVICES [address] [place], [place], [address] Phone: [phone]   Fax:  [phone]  Name: [name] [name] MRN: [id number] Date of Birth: [birth year]

## 2016-12-22 ENCOUNTER — Other Ambulatory Visit: Payer: Self-pay | Admitting: Family Medicine

## 2016-12-22 MED ORDER — METOPROLOL TARTRATE 25 MG PO TABS: 25.0000 mg | ORAL_TABLET | Freq: Two times a day (BID) | ORAL | 4 refills | Status: DC

## 2016-12-22 NOTE — Telephone Encounter (Signed)
Please review. Thanks!  

## 2016-12-22 NOTE — Telephone Encounter (Signed)
Pt needs refill on her  metoprolol (LOPRESSOR) 25 MG tablet [facility]  Pt states she will be out today.   Thanks [name]

## 2016-12-25 ENCOUNTER — Other Ambulatory Visit: Payer: Self-pay

## 2016-12-25 MED ORDER — GABAPENTIN 100 MG PO CAPS: 100.0000 mg | ORAL_CAPSULE | Freq: Two times a day (BID) | ORAL | 12 refills | Status: DC

## 2016-12-25 NOTE — Telephone Encounter (Signed)
Pharmacy requesting refills. Thanks!  

## 2016-12-28 ENCOUNTER — Encounter: Payer: Self-pay | Admitting: Physical Therapy

## 2016-12-28 ENCOUNTER — Ambulatory Visit: Payer: PPO | Attending: Neurology | Admitting: Physical Therapy

## 2016-12-28 VITALS — BP 117/64 | HR 96

## 2016-12-28 DIAGNOSIS — M6281 Muscle weakness (generalized): Secondary | ICD-10-CM | POA: Diagnosis not present

## 2016-12-28 DIAGNOSIS — R262 Difficulty in walking, not elsewhere classified: Secondary | ICD-10-CM | POA: Insufficient documentation

## 2016-12-28 DIAGNOSIS — R269 Unspecified abnormalities of gait and mobility: Secondary | ICD-10-CM | POA: Diagnosis not present

## 2016-12-28 DIAGNOSIS — R531 Weakness: Secondary | ICD-10-CM | POA: Diagnosis not present

## 2016-12-28 NOTE — Therapy (Signed)
[place] MAIN [facility] SERVICES [address] [place], [place], [address] Phone: [phone]   Fax:  [phone]  Physical Therapy Treatment  Patient Details  Name: [name] [name] MRN: [id number] Date of Birth: 07-03-1941 Referring Provider: Vladimir Crofts  Encounter Date: 12/28/2016      PT End of Session - 12/28/16 1429    Visit Number 10   Number of Visits 17   Date for PT Re-Evaluation 2017/01/31   Authorization Type g codes   Authorization Time Period 10/10   PT Start Time 1430   PT Stop Time 1511   PT Time Calculation (min) 41 min   Equipment Utilized During Treatment Gait belt   Activity Tolerance Patient tolerated treatment well;Patient limited by fatigue;Patient limited by pain   Behavior During Therapy [facility] for tasks assessed/performed      Past Medical History:  Diagnosis Date  . Arthritis    osteo; in B knees;   . Chronic a-fib ([place])   . DVT (deep venous thrombosis) ([place])    H/O  . Dysrhythmia    a fib  . Edema   . Heart murmur   . Hypertension    controlled  . Seizure ([place])    2 years ago knocked unconscious; hospitalized and diagnosed with seizures; no spells in last year;   . Stroke [facility])    1 month ago (possible stroke)    Past Surgical History:  Procedure Laterality Date  . ablation  2007   [place], [place]  . bilateral wrist surgery  2003  . BREAST BIOPSY Left    stereo  . BREAST SURGERY    . CATARACT EXTRACTION W/PHACO Right 06/01/2015   Procedure: CATARACT EXTRACTION PHACO AND INTRAOCULAR LENS PLACEMENT (IOC);  Surgeon: Birder Robson, MD;  Location: [facility] ORS;  Service: Ophthalmology;  Laterality: Right;  [place]: 01:02.0 AP%: 23.1 CDE: 14.33 Fluid lot# [id number]   . CATARACT EXTRACTION W/PHACO Left 06/22/2015   Procedure: CATARACT EXTRACTION PHACO AND INTRAOCULAR LENS PLACEMENT (IOC);  Surgeon: Birder Robson, MD;  Location: [facility] ORS;  Service: Ophthalmology;  Laterality: Left;  [place]: 01:00.7 AP%:  23.1 CDE: 14.01 Lot # [id number]  . COLONOSCOPY    . KNEE ARTHROPLASTY Right 03/01/2016   Procedure: COMPUTER ASSISTED TOTAL KNEE ARTHROPLASTY;  Surgeon: Dereck Leep, MD;  Location: [facility] ORS;  Service: Orthopedics;  Laterality: Right;  . TONSILLECTOMY    . TONSILLECTOMY      Vitals:   12/28/16 1437  BP: 117/64  Pulse: 96  SpO2: 98%        Subjective Assessment - 12/28/16 1437    Subjective Pt reports her back and Bil hips are sore which she attributes to cleaning behind her couch.  Additionally she reports swelling and pain over dorsal aspect of R foot which has been painful for ~1 wk.  She says this has happened before and that it usually gradually goes away.   Pertinent History Chronic back pain, R TKR, Bilateral wrist surgery, CVA, a-fib   Limitations Standing;Walking;House hold activities   Patient Stated Goals Patient wants to be able to walk without AD   Currently in Pain? Yes   Pain Score 7    Pain Location Foot   Pain Orientation Right   Pain Descriptors / Indicators Aching   Pain Type Acute pain   Pain Onset 1 to 4 weeks ago   Multiple Pain Sites Yes   Pain Score 5   Pain Location Back   Pain Orientation Lower  Pain Descriptors / Indicators Aching   Pain Type Chronic pain   Pain Onset More than a month ago   Pain Frequency Constant       TREATMENT   Examined pt's R foot which was slightly swollen but not tender to the touch. This PT was able to squeeze pt's R foot and calf painfree.   Outcome measures completed and results explained to the pt (no AD used):  5xSTS: 12.51 sec  51mT: 0.74 m/s  TUG: 13.29 sec  64m: 794 ft  SLS: 9 seconds    Neuromuscular training:  SLS each LE 2x30 seconds with up to minA to steady  Tandem stance with vertical and horizontal head turns with L foot forward 2x30 seconds and then with R foot forward 2x30  Step ups to airex pad x10 with up to minA to steady  Side step ups to airex pad x10 with up to minA to steady   Marching on airex 2x20 with up to minA to steady            PT Education - 12/28/16 1429    Education provided Yes   Education Details Exercise technique; results from outcome measures   Person(s) Educated Patient   Methods Explanation;Demonstration;Verbal cues   Comprehension Verbalized understanding;Returned demonstration;Need further instruction             PT Long Term Goals - 12/28/16 1702      PT LONG TERM GOAL #1   Title Patient will be independent in home exercise program to improve strength/mobility for better functional independence with ADLs by 11/29/16   Time 8   Period Weeks   Status On-going     PT LONG TERM GOAL #2   Title Patient (> 6069ears old) will complete five times sit to stand test in < 15 seconds indicating an increased LE strength and improved balance by 11/29/16   Baseline 16.02 sec; 3/8: 12.51 sec   Time 8   Period Weeks   Status Achieved     PT LONG TERM GOAL #3   Title Patient will increase six minute walk test distance to >1000 for progression to community ambulator and improve gait ability by 11/29/16   Baseline 620; 3/8: 794 ft   Time 8   Period Weeks   Status Partially Met     PT LONG TERM GOAL #4   Title Patient will increase 10 meter walk test to >1.69m61mas to improve gait speed for better community ambulation and to reduce fall risk by 11/29/16   Baseline .83; 3/8: 0.74 m/s   Time 8   Period Weeks   Status On-going     PT LONG TERM GOAL #5   Title Patient will tolerate 5 seconds of single leg stance without loss of balance to improve ability to get in and out of shower safely by 11/29/16   Baseline 7 sec; 3/8: 9 sec   Time 8   Period Weeks   Status Achieved               Plan - 12/28/16 1507    Clinical Impression Statement Pt demonstrates improvements in BLE strength and balance as evidenced by her SLS, 5xSTS, TUG, and 6mW92m Her 169mW29md not show improvement which pt attributes to her soreness in her back.  Overall pt  is improving with PT interventions but still has gains to be made as she continues to demonstrates deficits in strength, balance, and ambulatory endurance.  She will benefit  from continued skilled PT interventions to address these deficits.   Rehab Potential Fair   Clinical Impairments Affecting Rehab Potential pain in RLE down to foot intermittently, pain across back   PT Frequency 1x / week   PT Duration 8 weeks   PT Treatment/Interventions Aquatic Therapy;Cryotherapy;Moist Heat;Gait training;Therapeutic exercise;Balance training;Neuromuscular re-education;Passive range of motion   PT Next Visit Plan balance trainng and strengthening of LE and core   PT Home Exercise Plan standing hip abd and standing hip extension   Consulted and Agree with Plan of Care Patient      Patient will benefit from skilled therapeutic intervention in order to improve the following deficits and impairments:  Abnormal gait, Decreased balance, Decreased endurance, Difficulty walking, Decreased range of motion, Decreased strength, Pain, Impaired sensation  Visit Diagnosis: Muscle weakness (generalized)  Difficulty in walking, not elsewhere classified       G-Codes - Dec 29, 2016 1508    Functional Assessment Tool Used (Outpatient Only) TUG, 5xSTS, 56mT, 152m, SLS, Clinical Judgement   Functional Limitation Mobility: Walking and moving around   Mobility: Walking and Moving Around Current Status ([phone] least 40 percent but less than 60 percent impaired, limited or restricted   Mobility: Walking and Moving Around Goal Status ([phone] least 20 percent but less than 40 percent impaired, limited or restricted      Problem List Patient Active Problem List   Diagnosis Date Noted  . Osteoporosis 08/02/2016  . History of colon polyps 08/02/2016  . Low back pain 06/20/2016  . S/P total knee arthroplasty 03/01/2016  . Knee osteoarthritis 02/16/2016  . Weakness 09/27/2015  . Balloon like swelling of an artery of  the brain 02/12/2015  . Arthritis of knee, degenerative 05/26/2014  . History of CVA (cerebrovascular accident) 02/16/2014  . Seizures [place]  . Chronic leg pain 02/16/2014  . Fatigue 07/02/2013  . HYPERTENSION, BENIGN 12/07/2010  . ATRIAL FIBRILLATION 06/29/2009  . SHORTNESS OF BREATH 06/29/2009     AsCollie SiadT, DPT 12/24/07/20185:07 PM  [place] [facility]ICES [address]hone: [phone] Fax:  [phone]: [name] [name]: [id number] of Birth: [birth year]

## 2017-01-02 ENCOUNTER — Ambulatory Visit: Payer: PPO | Admitting: Physical Therapy

## 2017-01-02 ENCOUNTER — Encounter: Payer: Self-pay | Admitting: Physical Therapy

## 2017-01-02 DIAGNOSIS — M6281 Muscle weakness (generalized): Secondary | ICD-10-CM

## 2017-01-02 DIAGNOSIS — R262 Difficulty in walking, not elsewhere classified: Secondary | ICD-10-CM

## 2017-01-02 NOTE — Addendum Note (Signed)
Addended by: Alanson Puls on: 01/02/2017 05:16 PM   Modules accepted: Orders

## 2017-01-02 NOTE — Therapy (Signed)
[place] MAIN [facility] SERVICES [address] [place], [place], [address] Phone: [phone]   Fax:  [phone]  Physical Therapy Treatment  Patient Details  Name: [name] [name] MRN: [id number] Date of Birth: Mar 01, 1941 Referring Provider: Vladimir Crofts  Encounter Date: 01/02/2017      PT End of Session - 01/02/17 1423    Visit Number 11   Number of Visits 17   Date for PT Re-Evaluation 02/17/17   Authorization Type g codes   Authorization Time Period 11/10   PT Start Time 1415   PT Stop Time 1500   PT Time Calculation (min) 45 min   Equipment Utilized During Treatment Gait belt   Activity Tolerance Patient tolerated treatment well;Patient limited by fatigue;Patient limited by pain   Behavior During Therapy [facility] for tasks assessed/performed      Past Medical History:  Diagnosis Date  . Arthritis    osteo; in B knees;   . Chronic a-fib ([place])   . DVT (deep venous thrombosis) ([place])    H/O  . Dysrhythmia    a fib  . Edema   . Heart murmur   . Hypertension    controlled  . Seizure ([place])    2 years ago knocked unconscious; hospitalized and diagnosed with seizures; no spells in last year;   . Stroke [facility])    1 month ago (possible stroke)    Past Surgical History:  Procedure Laterality Date  . ablation  2007   [place], [place]  . bilateral wrist surgery  2003  . BREAST BIOPSY Left    stereo  . BREAST SURGERY    . CATARACT EXTRACTION W/PHACO Right 06/01/2015   Procedure: CATARACT EXTRACTION PHACO AND INTRAOCULAR LENS PLACEMENT (IOC);  Surgeon: Birder Robson, MD;  Location: [facility] ORS;  Service: Ophthalmology;  Laterality: Right;  [place]: 01:02.0 AP%: 23.1 CDE: 14.33 Fluid lot# [id number]   . CATARACT EXTRACTION W/PHACO Left 06/22/2015   Procedure: CATARACT EXTRACTION PHACO AND INTRAOCULAR LENS PLACEMENT (IOC);  Surgeon: Birder Robson, MD;  Location: [facility] ORS;  Service: Ophthalmology;  Laterality: Left;  [place]: 01:00.7 AP%:  23.1 CDE: 14.01 Lot # [id number]  . COLONOSCOPY    . KNEE ARTHROPLASTY Right 03/01/2016   Procedure: COMPUTER ASSISTED TOTAL KNEE ARTHROPLASTY;  Surgeon: Dereck Leep, MD;  Location: [facility] ORS;  Service: Orthopedics;  Laterality: Right;  . TONSILLECTOMY    . TONSILLECTOMY      There were no vitals filed for this visit.      Subjective Assessment - 01/02/17 1422    Subjective Pt reports her back and Bil hips are sore   Additionally she reports swelling and pain over dorsal aspect of R foot which has been painful for 2  wk.  She says this has happened before and that it usually gradually goes away.   Pertinent History Chronic back pain, R TKR, Bilateral wrist surgery, CVA, a-fib   Limitations Standing;Walking;House hold activities   Patient Stated Goals Patient wants to be able to walk without AD   Currently in Pain? Yes   Pain Location Foot   Pain Orientation Right   Pain Descriptors / Indicators Aching   Pain Type Acute pain   Pain Onset 1 to 4 weeks ago   Pain Frequency Intermittent   Aggravating Factors  walking   Pain Relieving Factors ice   Multiple Pain Sites No   Pain Onset More than a month ago     Treatment: Tapping 6 inch step BLE  x 20, CGA Blue foam and step ups to 6 inch step x 20 CGA Matrix fwd/bwd/side stepping x 5 CGA Step ups to 6 inch stool side side stepping left and right  x 20 , CGA Step ups to airex pad x10 with up to minA to steady  Side step ups to airex pad x10 with up to minA to steady  Marching on airex 2x20 with up to minA to steady    Patient needs occasional verbal cueing to improve posture and cueing to correctly perform exercises slowly,and cues to weight shift fwd.  She says that her left knee feels better after her treatment.                           PT Education - 01/02/17 1423    Education provided Yes   Education Details posture correction   Person(s) Educated Patient   Methods Explanation   Comprehension  Verbalized understanding             PT Long Term Goals - 12/28/16 1702      PT LONG TERM GOAL #1   Title Patient will be independent in home exercise program to improve strength/mobility for better functional independence with ADLs by 11/29/16   Time 8   Period Weeks   Status On-going     PT LONG TERM GOAL #2   Title Patient (> 75 years old) will complete five times sit to stand test in < 15 seconds indicating an increased LE strength and improved balance by 11/29/16   Baseline 16.02 sec; 3/8: 12.51 sec   Time 8   Period Weeks   Status Achieved     PT LONG TERM GOAL #3   Title Patient will increase six minute walk test distance to >1000 for progression to community ambulator and improve gait ability by 11/29/16   Baseline 620; 3/8: 794 ft   Time 8   Period Weeks   Status Partially Met     PT LONG TERM GOAL #4   Title Patient will increase 10 meter walk test to >1.27ms as to improve gait speed for better community ambulation and to reduce fall risk by 11/29/16   Baseline .83; 3/8: 0.74 m/s   Time 8   Period Weeks   Status On-going     PT LONG TERM GOAL #5   Title Patient will tolerate 5 seconds of single leg stance without loss of balance to improve ability to get in and out of shower safely by 11/29/16   Baseline 7 sec; 3/8: 9 sec   Time 8   Period Weeks   Status Achieved               Plan - 01/02/17 1424    Clinical Impression Statement Patient required min verbal cueing during hip exercise and all balance training to correct posture and form. Patient demonstrates ability to perform strengthening exercises and dynamic standing balance exercises with no increase in pain but with moderate fatigue. Patient will continue to benefit from continued skilled therapy in order to improve dynamic standing balance and increase strength in order to decrease falls   Rehab Potential Fair   Clinical Impairments Affecting Rehab Potential pain in RLE down to foot intermittently, pain  across back   PT Frequency 1x / week   PT Duration 8 weeks   PT Treatment/Interventions Aquatic Therapy;Cryotherapy;Moist Heat;Gait training;Therapeutic exercise;Balance training;Neuromuscular re-education;Passive range of motion   PT Next Visit Plan balance  trainng and strengthening of LE and core   PT Home Exercise Plan standing hip abd and standing hip extension   Consulted and Agree with Plan of Care Patient      Patient will benefit from skilled therapeutic intervention in order to improve the following deficits and impairments:  Abnormal gait, Decreased balance, Decreased endurance, Difficulty walking, Decreased range of motion, Decreased strength, Pain, Impaired sensation  Visit Diagnosis: Muscle weakness (generalized)  Difficulty in walking, not elsewhere classified     Problem List Patient Active Problem List   Diagnosis Date Noted  . Osteoporosis 08/02/2016  . History of colon polyps 08/02/2016  . Low back pain 06/20/2016  . S/P total knee arthroplasty 03/01/2016  . Knee osteoarthritis 02/16/2016  . Weakness 09/27/2015  . 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 Arthritis of knee, degenerative 05/26/2014  . History of CVA (cerebrovascular accident) 02/16/2014  . Seizures ([place]) 02/16/2014  . Chronic leg pain 02/16/2014  . Fatigue 07/02/2013  . HYPERTENSION, BENIGN 12/07/2010  . ATRIAL FIBRILLATION 06/29/2009  . SHORTNESS OF BREATH 06/29/2009  Alanson Puls, PT, DPT  [place], [place] S 01/02/2017, 2:25 PM  [place] MAIN [facility] SERVICES [address] [place], [place], [address] Phone: [phone]   Fax:  [phone]  Name: KARLEA MCKIBBIN MRN: [id number] Date of Birth: 1940/12/04

## 2017-01-09 ENCOUNTER — Other Ambulatory Visit: Payer: Self-pay | Admitting: Family Medicine

## 2017-01-09 MED ORDER — DILTIAZEM HCL ER COATED BEADS 240 MG PO CP24: 240.0000 mg | ORAL_CAPSULE | Freq: Every day | ORAL | 12 refills | Status: DC

## 2017-01-09 NOTE — Telephone Encounter (Signed)
Pt contacted office for refill request on the following medications: diltiazem (CARTIA XT) 240 MG 24 hr capsule  [facility]. Please advise. Thanks TNP

## 2017-01-09 NOTE — Telephone Encounter (Signed)
LOV 08/02/2016. Last refill 09/01/2016. Renaldo Fiddler, CMA

## 2017-01-10 ENCOUNTER — Encounter: Payer: Self-pay | Admitting: Physical Therapy

## 2017-01-10 ENCOUNTER — Ambulatory Visit: Payer: PPO | Admitting: Physical Therapy

## 2017-01-10 DIAGNOSIS — R262 Difficulty in walking, not elsewhere classified: Secondary | ICD-10-CM

## 2017-01-10 DIAGNOSIS — M6281 Muscle weakness (generalized): Secondary | ICD-10-CM

## 2017-01-10 DIAGNOSIS — R269 Unspecified abnormalities of gait and mobility: Secondary | ICD-10-CM

## 2017-01-10 DIAGNOSIS — R531 Weakness: Secondary | ICD-10-CM

## 2017-01-10 NOTE — Therapy (Signed)
[place] MAIN [facility] SERVICES [address] [place], [place], [address] Phone: [phone]   Fax:  [phone]  Physical Therapy Treatment  Patient Details  Name: [name] [name] MRN: [id number] Date of Birth: 16-Jul-1941 Referring Provider: Vladimir Crofts  Encounter Date: 01/10/2017      PT End of Session - 01/10/17 1313    Visit Number 12   Number of Visits 17   Date for PT Re-Evaluation 03-Feb-2017   Authorization Type g codes   Authorization Time Period 2/10   PT Start Time 0110   PT Stop Time 0148   PT Time Calculation (min) 38 min   Equipment Utilized During Treatment Gait belt   Activity Tolerance Patient tolerated treatment well;Patient limited by fatigue;Patient limited by pain   Behavior During Therapy [facility] for tasks assessed/performed      Past Medical History:  Diagnosis Date  . Arthritis    osteo; in B knees;   . Chronic a-fib ([place])   . DVT (deep venous thrombosis) ([place])    H/O  . Dysrhythmia    a fib  . Edema   . Heart murmur   . Hypertension    controlled  . Seizure ([place])    2 years ago knocked unconscious; hospitalized and diagnosed with seizures; no spells in last year;   . Stroke [facility])    1 month ago (possible stroke)    Past Surgical History:  Procedure Laterality Date  . ablation  2007   [place], [place]  . bilateral wrist surgery  2003  . BREAST BIOPSY Left    stereo  . BREAST SURGERY    . CATARACT EXTRACTION W/PHACO Right 06/01/2015   Procedure: CATARACT EXTRACTION PHACO AND INTRAOCULAR LENS PLACEMENT (IOC);  Surgeon: Birder Robson, MD;  Location: [facility] ORS;  Service: Ophthalmology;  Laterality: Right;  [place]: 01:02.0 AP%: 23.1 CDE: 14.33 Fluid lot# [id number]   . CATARACT EXTRACTION W/PHACO Left 06/22/2015   Procedure: CATARACT EXTRACTION PHACO AND INTRAOCULAR LENS PLACEMENT (IOC);  Surgeon: Birder Robson, MD;  Location: [facility] ORS;  Service: Ophthalmology;  Laterality: Left;  [place]: 01:00.7 AP%:  23.1 CDE: 14.01 Lot # [id number]  . COLONOSCOPY    . KNEE ARTHROPLASTY Right 03/01/2016   Procedure: COMPUTER ASSISTED TOTAL KNEE ARTHROPLASTY;  Surgeon: Dereck Leep, MD;  Location: [facility] ORS;  Service: Orthopedics;  Laterality: Right;  . TONSILLECTOMY    . TONSILLECTOMY      There were no vitals filed for this visit.      Subjective Assessment - 01/10/17 1311    Subjective Pt reports her back is hurting. No new complaints. He foot keep swelling up.    Pertinent History Chronic back pain, R TKR, Bilateral wrist surgery, CVA, a-fib   Limitations Standing;Walking;House hold activities   Patient Stated Goals Patient wants to be able to walk without AD   Currently in Pain? Yes   Pain Score 6    Pain Location Back   Pain Orientation Lower   Pain Descriptors / Indicators Aching   Pain Type Chronic pain   Pain Onset 1 to 4 weeks ago   Pain Frequency Constant   Aggravating Factors  walking   Pain Relieving Factors heat   Effect of Pain on Daily Activities needs to sit down   Multiple Pain Sites No   Pain Onset More than a month ago     Treatment: Therapeutic exercise and neuromuscular training: Leg press 100 lbs 20 x 3 sets Heel raises x 20  x 3 1/2 foam flat side up and balance with head turns left and right feet apart and feet together, tandem standing on 1/2 foam   standing hip abd with YTB x 20   side stepping left and right in parallel bars 10 feet x 3 step ups from floor to 6 inch stool x 20 bilateral marching in parallel bars x 20 Tilt board fwd/bwd, side to side left and right Stepping over bolster left and right and fwd/bwd Tm walking 1. 0 m/hour x 5 mins  Patient needs occasional verbal cueing to improve posture and cueing to correctly perform exercises slowly, holding at end of range to increase motor firing of desired muscle to encourage fatigue.                           PT Education - 01/10/17 1312    Education provided Yes   Education  Details posture correction, heel toe gait   Person(s) Educated Patient   Methods Explanation   Comprehension Verbalized understanding             PT Long Term Goals - 12/28/16 1702      PT LONG TERM GOAL #1   Title Patient will be independent in home exercise program to improve strength/mobility for better functional independence with ADLs by 11/29/16   Time 8   Period Weeks   Status On-going     PT LONG TERM GOAL #2   Title Patient (> 72 years old) will complete five times sit to stand test in < 15 seconds indicating an increased LE strength and improved balance by 11/29/16   Baseline 16.02 sec; 3/8: 12.51 sec   Time 8   Period Weeks   Status Achieved     PT LONG TERM GOAL #3   Title Patient will increase six minute walk test distance to >1000 for progression to community ambulator and improve gait ability by 11/29/16   Baseline 620; 3/8: 794 ft   Time 8   Period Weeks   Status Partially Met     PT LONG TERM GOAL #4   Title Patient will increase 10 meter walk test to >1.22ms as to improve gait speed for better community ambulation and to reduce fall risk by 11/29/16   Baseline .83; 3/8: 0.74 m/s   Time 8   Period Weeks   Status On-going     PT LONG TERM GOAL #5   Title Patient will tolerate 5 seconds of single leg stance without loss of balance to improve ability to get in and out of shower safely by 11/29/16   Baseline 7 sec; 3/8: 9 sec   Time 8   Period Weeks   Status Achieved               Plan - 01/10/17 1314    Clinical Impression Statement Despite reporting soreness in patients back patient tolerated all interventions well this session without an increase in pain.  Patient demonstrates imbalance with stepping activities and when balancing on uneven surfaces.  Patient will benefit from continued skilled PT interventions for improved strengthening and balance and decrease patients risk of falling.   Rehab Potential Fair   Clinical Impairments Affecting Rehab  Potential pain in RLE down to foot intermittently, pain across back   PT Frequency 1x / week   PT Duration 8 weeks   PT Treatment/Interventions Aquatic Therapy;Cryotherapy;Moist Heat;Gait training;Therapeutic exercise;Balance training;Neuromuscular re-education;Passive range of motion   PT Next  Visit Plan balance trainng and strengthening of LE and core   PT Home Exercise Plan standing hip abd and standing hip extension   Consulted and Agree with Plan of Care Patient      Patient will benefit from skilled therapeutic intervention in order to improve the following deficits and impairments:  Abnormal gait, Decreased balance, Decreased endurance, Difficulty walking, Decreased range of motion, Decreased strength, Pain, Impaired sensation  Visit Diagnosis: Muscle weakness (generalized)  Difficulty in walking, not elsewhere classified  Weakness  Abnormality of gait     Problem List Patient Active Problem List   Diagnosis Date Noted  . Osteoporosis 08/02/2016  . History of colon polyps 08/02/2016  . Low back pain 06/20/2016  . S/P total knee arthroplasty 03/01/2016  . Knee osteoarthritis 02/16/2016  . Weakness 09/27/2015  . 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 Arthritis of knee, degenerative 05/26/2014  . History of CVA (cerebrovascular accident) 02/16/2014  . Seizures ([place]) 02/16/2014  . Chronic leg pain 02/16/2014  . Fatigue 07/02/2013  . HYPERTENSION, BENIGN 12/07/2010  . ATRIAL FIBRILLATION 06/29/2009  . SHORTNESS OF BREATH 06/29/2009   Alanson Puls, PT, DPT [place], [place] S 01/10/2017, 1:34 PM  [place] MAIN [facility] SERVICES [address] [place], [place], [address] Phone: [phone]   Fax:  [phone]  Name: [name] [name] MRN: [id number] Date of Birth: 1940/12/30

## 2017-01-11 ENCOUNTER — Ambulatory Visit (INDEPENDENT_AMBULATORY_CARE_PROVIDER_SITE_OTHER): Payer: PPO | Admitting: Family Medicine

## 2017-01-11 ENCOUNTER — Encounter: Payer: Self-pay | Admitting: Family Medicine

## 2017-01-11 VITALS — BP 118/62 | Temp 97.8°F | Resp 16 | Wt 165.0 lb

## 2017-01-11 DIAGNOSIS — I482 Chronic atrial fibrillation, unspecified: Secondary | ICD-10-CM

## 2017-01-11 DIAGNOSIS — M545 Low back pain, unspecified: Secondary | ICD-10-CM

## 2017-01-11 DIAGNOSIS — G8929 Other chronic pain: Secondary | ICD-10-CM

## 2017-01-11 MED ORDER — DILTIAZEM HCL ER COATED BEADS 240 MG PO CP24: 240.0000 mg | ORAL_CAPSULE | Freq: Every day | ORAL | 4 refills | Status: DC

## 2017-01-11 MED ORDER — GABAPENTIN 100 MG PO CAPS: 200.0000 mg | ORAL_CAPSULE | Freq: Two times a day (BID) | ORAL | 1 refills | Status: DC

## 2017-01-11 NOTE — Patient Instructions (Signed)
Try OTC 4% Lidocaine patch and/or TENs units

## 2017-01-11 NOTE — Progress Notes (Signed)
Patient: [name] [name] Female    DOB: 1941/06/02   76 y.o.   MRN: [id number] Visit Date: 01/11/2017  Today's Provider: Lelon Huh, MD   Chief Complaint  Patient presents with  . Hip Pain   Subjective:    Hip Pain   The incident occurred more than 1 week ago (3 weeks ago ). The incident occurred at home. The injury mechanism was a fall. The pain is present in the left hip and right hip. The quality of the pain is described as aching. The pain is at a severity of 8/10. The pain is moderate. The pain has been intermittent (patient reports that the pain eases up when she is sitting. She expriences the pain when moving) since onset. Associated symptoms include an inability to bear weight. Pertinent negatives include no numbness or tingling. She has tried rest for the symptoms. The treatment provided mild relief.  States her walker got stuck and collapsed and she gradually fell to the ground, no injuries from fall.  Patient reports that it feels like a "achy band" around her waist. She reports that her pain sometimes radiate to her feet, which are not new but increasing limiting her activity. Has taken occasional oxycodone which provides minimal relief.  Had MRI ordered by Dr. Marry Guan in August with lumbar spondylosis and DDD at multiple levels. Has been doing physical therapy weekly off and on over the last several months. Has seen Dr. Sharlet Salina and states which helps for a few months. Has follow up scheduled in early April MRI 06/08/17 IMPRESSION: 1. Lumbar spondylosis, scoliosis, and degenerative disc disease, resulting in moderate impingement at L5-S1 and L3-4; mild to moderate impingement at L4-5 ; and mild impingement at L1- 2 and L2- 3, as detailed above. 2. Left facet effusion at L5-S1.     Allergies  Allergen Reactions  . Aspirin Other (See Comments)    On xarelto     Current Outpatient Prescriptions:  .  acetaminophen (TYLENOL) 500 MG tablet, Take 1,000 mg by  mouth every 6 (six) hours as needed., Disp: , Rfl:  .  alendronate (FOSAMAX) 70 MG tablet, Take 1 tablet (70 mg total) by mouth every 7 (seven) days. Take with a full glass of water on an empty stomach., Disp: 12 tablet, Rfl: 4 .  b complex vitamins tablet, Take 1 tablet by mouth daily., Disp: , Rfl:  .  Biotin 5000 MCG CAPS, Take 1 capsule by mouth daily., Disp: , Rfl:  .  CALCIUM-VITAMIN D PO, Take 800 mg by mouth daily. 1000 iu vitamin d, Disp: , Rfl:  .  Cholecalciferol (VITAMIN D3) 2000 units TABS, Take 200 Units by mouth daily., Disp: , Rfl:  .  Coenzyme Q10 (CO Q-10) 100 MG CAPS, Take 100 mg by mouth 1 day or 1 dose.  , Disp: , Rfl:  .  Cyanocobalamin (VITAMIN B 12 PO), Take 100 mg by mouth daily. , Disp: , Rfl:  .  diltiazem (CARTIA XT) 240 MG 24 hr capsule, Take 1 capsule (240 mg total) by mouth daily., Disp: 30 capsule, Rfl: 12 .  gabapentin (NEURONTIN) 100 MG capsule, Take 1 capsule (100 mg total) by mouth 2 (two) times daily., Disp: 180 capsule, Rfl: 12 .  Krill Oil 300 MG CAPS, Take 350 mg by mouth daily. , Disp: , Rfl:  .  Mag Aspart-Potassium Aspart (POTASSIUM & MAGNESIUM ASPARTAT PO), Take 1 tablet by mouth at bedtime. , Disp: , Rfl:  .  metoprolol tartrate (LOPRESSOR) 25 MG tablet, Take 1 tablet (25 mg total) by mouth 2 (two) times daily., Disp: 180 tablet, Rfl: 4 .  oxyCODONE (OXY IR/ROXICODONE) 5 MG immediate release tablet, Take 1-2 tablets (5-10 mg total) by mouth every 4 (four) hours as needed for severe pain., Disp: 60 tablet, Rfl: 0 .  Probiotic Product (PROBIOTIC FORMULA) CAPS, Take 1 capsule by mouth at bedtime. , Disp: , Rfl:  .  rivaroxaban (XARELTO) 20 MG TABS tablet, Take 1 tablet (20 mg total) by mouth daily with supper., Disp: 30 tablet, Rfl: 0 .  traMADol (ULTRAM) 50 MG tablet, Take 1 tablet (50 mg total) by mouth every 8 (eight) hours as needed for moderate pain., Disp: 30 tablet, Rfl: 2 .  TURMERIC PO, Take 1 capsule by mouth 2 (two) times daily. Takes 800 mg in  the morning and 500 mg in the evening, Disp: , Rfl:   Review of Systems  Constitutional: Negative.   Musculoskeletal: Positive for arthralgias, back pain, gait problem and myalgias.  Skin: Negative.   Neurological: Negative for tingling, weakness and numbness.    Social History  Substance Use Topics  . Smoking status: Former Research scientist (life sciences)  . Smokeless tobacco: Never Used  . Alcohol use No     Comment: wine- occasionally   Objective:   BP 118/62 (BP Location: Left Arm, Patient Position: Sitting, Cuff Size: Normal)   Temp 97.8 F (36.6 C)   Resp 16   Wt 165 lb (74.8 kg)   BMI 31.18 kg/m  Vitals:   01/11/17 1130  BP: 118/62  Resp: 16  Temp: 97.8 F (36.6 C)  Weight: 165 lb (74.8 kg)     Physical Exam   General Appearance:    Alert, cooperative, no distress  Eyes:    PERRL, conjunctiva/corneas clear, EOM's intact       Lungs:     Clear to auscultation bilaterally, respirations unlabored  Heart:    Regular rate and rhythm  Neurologic:   Awake, alert, oriented x 3. No apparent focal neurological           defect.          Assessment & Plan:     1. Chronic bilateral low back pain without sciatica Patient Instructions  Try OTC 4% Lidocaine patch and/or TENs units  Increase gabapentin to 2x100 twice a day.Folllow up Dr. Sharlet Salina in April as scheduled.   2. Chronic atrial fibrillation (HCC) Rate well controlled. Refilled diltiazem for 90 day.   Over half of this 25 minute visit were spent in counseling and coordinating care of multiple medical problems.       Lelon Huh, MD  [facility]

## 2017-01-16 NOTE — Addendum Note (Signed)
Addended by: Alanson Puls on: 01/16/2017 05:09 PM   Modules accepted: Orders

## 2017-01-17 ENCOUNTER — Encounter: Payer: Self-pay | Admitting: Physical Therapy

## 2017-01-17 ENCOUNTER — Ambulatory Visit: Payer: PPO | Admitting: Physical Therapy

## 2017-01-17 ENCOUNTER — Ambulatory Visit: Payer: Self-pay | Admitting: Family Medicine

## 2017-01-17 DIAGNOSIS — R269 Unspecified abnormalities of gait and mobility: Secondary | ICD-10-CM

## 2017-01-17 DIAGNOSIS — R262 Difficulty in walking, not elsewhere classified: Secondary | ICD-10-CM

## 2017-01-17 DIAGNOSIS — M6281 Muscle weakness (generalized): Secondary | ICD-10-CM

## 2017-01-17 DIAGNOSIS — R531 Weakness: Secondary | ICD-10-CM
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
## 2017-02-06 DIAGNOSIS — H02052 Trichiasis without entropian right lower eyelid: Secondary | ICD-10-CM | POA: Diagnosis not present

## 2017-02-06 DIAGNOSIS — M48062 Spinal stenosis, lumbar region with neurogenic claudication: Secondary | ICD-10-CM | POA: Diagnosis not present

## 2017-02-06 DIAGNOSIS — M5416 Radiculopathy, lumbar region: Secondary | ICD-10-CM | POA: Diagnosis not present

## 2017-02-06 DIAGNOSIS — M5136 Other intervertebral disc degeneration, lumbar region: Secondary | ICD-10-CM | POA: Diagnosis not present

## 2017-02-06 DIAGNOSIS — H0014 Chalazion left upper eyelid: Secondary | ICD-10-CM | POA: Diagnosis not present

## 2017-02-07 ENCOUNTER — Other Ambulatory Visit: Payer: Self-pay | Admitting: Family Medicine
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
## 2017-02-15 ENCOUNTER — Encounter: Payer: Self-pay | Admitting: Cardiovascular Disease

## 2017-02-15 ENCOUNTER — Ambulatory Visit (INDEPENDENT_AMBULATORY_CARE_PROVIDER_SITE_OTHER): Payer: PPO | Admitting: Cardiovascular Disease

## 2017-02-15 VITALS — BP 110/70 | HR 77 | Ht 61.0 in | Wt 164.2 lb

## 2017-02-15 DIAGNOSIS — I482 Chronic atrial fibrillation, unspecified: Secondary | ICD-10-CM

## 2017-02-15 DIAGNOSIS — R0602 Shortness of breath: Secondary | ICD-10-CM

## 2017-02-15 DIAGNOSIS — I10 Essential (primary) hypertension: Secondary | ICD-10-CM

## 2017-02-15 DIAGNOSIS — R531 Weakness: Secondary | ICD-10-CM

## 2017-02-15 DIAGNOSIS — R5383 Other fatigue: Secondary | ICD-10-CM | POA: Diagnosis not present

## 2017-02-15 NOTE — Patient Instructions (Signed)

## 2017-02-23 DIAGNOSIS — M1712 Unilateral primary osteoarthritis, left knee: Secondary | ICD-10-CM | POA: Diagnosis not present

## 2017-02-28 ENCOUNTER — Other Ambulatory Visit: Payer: Self-pay | Admitting: Family Medicine

## 2017-02-28 ENCOUNTER — Other Ambulatory Visit: Payer: Self-pay

## 2017-02-28 NOTE — Telephone Encounter (Signed)
Pt needs refill on   gabapentin (NEURONTIN) 100 MG capsule  [facility]  Thank sTeri

## 2017-02-28 NOTE — Patient Outreach (Signed)
[facility]) Care Management  02/28/2017  Hessie Varone Feltman [birth year] [id number]  TELEPHONE SCREENING for EMMI Prevent Referral date: 02/27/17 Referral source: EMMI prevent referral Referral reason: Assess for patient needs Insurance: [employer] Outreach attempt #1  Telephone call to patient regarding EMMI prevent referral. Patient states she is currently out and request call.   PLAN: RNCM will attempt 2nd telephone outreach within 1 week.   Quinn Plowman RN,BSN,CCM [facility] Telephonic  [phone]

## 2017-03-01 ENCOUNTER — Other Ambulatory Visit: Payer: Self-pay

## 2017-03-01 NOTE — Telephone Encounter (Signed)
We increase gabapentin to 2 x 100mg  twice a day at her last appointment. Does she want [place] to change to 200mg  capsules?

## 2017-03-01 NOTE — Patient Outreach (Signed)
[name] [name]) Care Management  03/01/2017  [name] [name] Oct 31, 1940 [id number]  .TELEPHONE SCREENING Referral date:02/27/17 Referral source: EMMI prevent Referral reason: telephone screening  Insurance: Health team advantage   PROVIDERS:  Dr. Lelon Huh - Primary MD Dr. Ida Rogue - cardiology   SOCIAL/ SUPPORTIVE CARE:  Patient lives alone in a retirement community.  Next of kin: Daughter Koleen Distance  SUBJECTIVE: Telephone call to patient regarding EMMI prevent screening. HIPAA verified with patient. Discussed and offered [facility] care management services to patient. Patient states she would like to have assistance with an advance directive and transportation.  Patient reports she has atrial fibrillation and high blood pressure. Patient reports both conditions are controlled and denied need for nursing follow up at this time.  Patient reports she has had 1 fall in the past 3 months. Patient states she was using a 3 wheel walker which was unsteady. Patient states she did not sustain an injury from the fall.  Patient reports she is now using a 4 wheel walker which is more steady for her.  RNCM advised patient to notify MD of any changes in condition prior to scheduled appointment. Patient reports she has her medications and is able to afford.  RNCM provided contact name and number: [phone] or main office number [phone] and 24 hour nurse advise line [phone].  RNCM verified patient aware of 911 services for urgent/ emergent needs.Marland Kitchen   PLAN: RNCM will refer patient to social worker for transportation and advance directive assistance.   Quinn Plowman RN,BSN,CCM [name] Telephonic  [phone]

## 2017-03-02 MED ORDER — GABAPENTIN 100 MG PO CAPS: 200.0000 mg | ORAL_CAPSULE | Freq: Two times a day (BID) | ORAL | 1 refills | Status: DC

## 2017-03-02 NOTE — Telephone Encounter (Signed)
Patient states she wants Gabapentin as per new dose 2 capsules of 100 mg twice daily please. Thank you. She states that has been [place]

## 2017-03-05 NOTE — Addendum Note (Signed)
Addended by: Quinn Plowman E on: 03/05/2017 09:56 AM   Modules accepted: Orders

## 2017-03-08 DIAGNOSIS — M5416 Radiculopathy, lumbar region: Secondary | ICD-10-CM | POA: Diagnosis not present

## 2017-03-08 DIAGNOSIS — M5136 Other intervertebral disc degeneration, lumbar region: Secondary | ICD-10-CM | POA: Diagnosis not present

## 2017-03-08 DIAGNOSIS — M48062 Spinal stenosis, lumbar region with neurogenic claudication: Secondary | ICD-10-CM | POA: Diagnosis not present

## 2017-03-09 ENCOUNTER — Other Ambulatory Visit: Payer: Self-pay | Admitting: *Deleted

## 2017-03-09 NOTE — Patient Outreach (Addendum)
[facility]) Care Management  03/09/2017  [name] [name] Dec 02, 1940 [id number]   Return call from patient to discuss transportation needs and assistance with here Advanced Directive. Per patient, she currently resides in [place] at [facility]. Patient reports being aware of the [facility] and has been placed on their low income list. Per patient, they will often not charge her if she has multiple medical visits in one day. Per patient, she will continue to use this service, however was looking for something less expensive. This Education officer, museum explained that [employer] is probobly  the least expensive mode of transportation in [place].    Patient's Advanced Directive discussed. Per patient, she was loking for someone to assist her with her Will. This Education officer, museum explained the difference between the Advanced Directive, a Will and Hospital doctor. Patient requested that the Advanced Directive be mailed to her. She denied need for assistance completing the document. Per patient , they have a notary where she lives. Patient verbalized having no additional community resource needs. Patient continues to deny need for a [facility] nurse as she as access to the nurses at [facility] next door to where she lives.  Patient to be closed to [facility] social work.   Plan: This social worker will mail the Advanced Directive to patient to complete per her request.    Sheralyn Boatman [facility] Care Management [phone]

## 2017-03-09 NOTE — Patient Outreach (Signed)
[facility]) Care Management  03/09/2017  [name] [name] September 08, 1941 [id number]   Patient referred to this social worker to assist with her advanced directive and community resources for transportation. HIPPA compliant voicemail message left for a return call.    Sheralyn Boatman [facility] Care Management [phone]

## 2017-03-12 ENCOUNTER — Ambulatory Visit: Payer: Self-pay | Admitting: Family Medicine

## 2017-03-14 ENCOUNTER — Encounter: Payer: Self-pay | Admitting: Family Medicine

## 2017-03-14 ENCOUNTER — Ambulatory Visit (INDEPENDENT_AMBULATORY_CARE_PROVIDER_SITE_OTHER): Payer: PPO | Admitting: Family Medicine

## 2017-03-14 VITALS — BP 104/60 | HR 66 | Temp 97.6°F | Resp 16 | Wt 163.0 lb

## 2017-03-14 DIAGNOSIS — I482 Chronic atrial fibrillation, unspecified: Secondary | ICD-10-CM

## 2017-03-14 DIAGNOSIS — G8929 Other chronic pain: Secondary | ICD-10-CM

## 2017-03-14 DIAGNOSIS — N390 Urinary tract infection, site not specified: Secondary | ICD-10-CM

## 2017-03-14 DIAGNOSIS — I10 Essential (primary) hypertension: Secondary | ICD-10-CM

## 2017-03-14 DIAGNOSIS — M545 Low back pain: Secondary | ICD-10-CM

## 2017-03-14 DIAGNOSIS — M79606 Pain in leg, unspecified: Secondary | ICD-10-CM

## 2017-03-14 DIAGNOSIS — R350 Frequency of micturition: Secondary | ICD-10-CM

## 2017-03-14 LAB — POCT URINALYSIS DIPSTICK
Bilirubin, UA: NEGATIVE
Glucose, UA: NEGATIVE
Ketones, UA: NEGATIVE
Leukocytes, UA: NEGATIVE
Nitrite, UA: NEGATIVE
Spec Grav, UA: 1.025 (ref 1.010–1.025)
Urobilinogen, UA: 0.2 E.U./dL
pH, UA: 6 (ref 5.0–8.0)

## 2017-03-14 MED ORDER — METOPROLOL TARTRATE 25 MG PO TABS: 25.0000 mg | ORAL_TABLET | Freq: Two times a day (BID) | ORAL | 1 refills | Status: DC

## 2017-03-14 MED ORDER — METOPROLOL TARTRATE 25 MG PO TABS: 25.0000 mg | ORAL_TABLET | Freq: Every day | ORAL | 1 refills | Status: DC

## 2017-03-14 MED ORDER — HYDROCODONE-ACETAMINOPHEN 5-325 MG PO TABS
1.0000 | ORAL_TABLET | Freq: Four times a day (QID) | ORAL | 0 refills | Status: DC | PRN
Start: 2017-03-14 — End: 2017-04-12

## 2017-03-14 NOTE — Progress Notes (Signed)
Patient: [name] [name] Female    DOB: 23-Jul-1941   76 y.o.   MRN: [id number] Visit Date: 03/14/2017  Today's Provider: Lelon Huh, MD   Chief Complaint  Patient presents with  . Atrial Fibrillation    follow up  . Hypertension    follow up  . Osteoporosis    follow up  . Back Pain    follow up   Subjective:    HPI    Hypertension, follow-up:  BP Readings from Last 3 Encounters:  02/15/17 110/70  01/11/17 118/62  12/28/16 117/64    She was last seen for hypertension 7 months ago.  BP at that visit was . Management since that visit includes 120/70. She reports good compliance with treatment. She is not having side effects.  She is exercising. She is not adherent to low salt diet.   Outside blood pressures are not being checked. She is experiencing fatigue.  Patient denies chest pain, chest pressure/discomfort, claudication, dyspnea, exertional chest pressure/discomfort, irregular heart beat, lower extremity edema, near-syncope, orthopnea, palpitations, paroxysmal nocturnal dyspnea, syncope and tachypnea.   Cardiovascular risk factors include advanced age (older than 30 for men, 55 for women) and hypertension.  Use of agents associated with hypertension: none.     Weight trend: stable Wt Readings from Last 3 Encounters:  02/15/17 164 lb 4 oz (74.5 kg)  01/11/17 165 lb (74.8 kg)  08/02/16 160 lb (72.6 kg)    Current diet: well balanced  ------------------------------------------------------------------------ Follow up of Osteoporosis:  Patient was last seen for this problem 6 months ago. Management during that visit includes ordering BMD which showed Osteoporosis. Patient was started on Alendronate 70mg  every week, and advised to follow up in 4 months. Patient reports good compliance with treatment and good tolerance.  Follow up of Atrial Fibrillation:  Patient was last seen for this problem 2 months ago and no changes were made. States BP has  been running low. She has started skipping nighttime dose of metoprolol   Follow up of Low back pain:  Patient was last seen for this problem 2 months ago. Management during that visit includes advising patient to try OTC 4% Lidocaine patches and TENS units. Gabapentin was also increased to 2 x 100mg  twice a day. Patient was advised to follow up with Dr. Sharlet Salina. Since the last visit, patient states her back pain is unchanged. She has seen Dr. Sharlet Salina and had an injection with no relief. Patient has increased Gabapentin as she was told and nothing seems to help improve her back pain  Complains or urgency and urinary frequency. .     Allergies  Allergen Reactions  . Aspirin Other (See Comments)    On xarelto     Current Outpatient Prescriptions:  .  acetaminophen (TYLENOL) 500 MG tablet, Take 1,000 mg by mouth every 6 (six) hours as needed., Disp: , Rfl:  .  alendronate (FOSAMAX) 70 MG tablet, Take 1 tablet (70 mg total) by mouth every 7 (seven) days. Take with a full glass of water on an empty stomach., Disp: 12 tablet, Rfl: 4 .  b complex vitamins tablet, Take 1 tablet by mouth daily., Disp: , Rfl:  .  Biotin 5000 MCG CAPS, Take 1 capsule by mouth daily., Disp: , Rfl:  .  CALCIUM-VITAMIN D PO, Take 800 mg by mouth daily. 1000 iu vitamin d, Disp: , Rfl:  .  Cholecalciferol (VITAMIN D3) 2000 units TABS, Take 200 Units by mouth daily., Disp: ,  Rfl:  .  Coenzyme Q10 (CO Q-10) 100 MG CAPS, Take 100 mg by mouth 1 day or 1 dose.  , Disp: , Rfl:  .  Cyanocobalamin (VITAMIN B 12 PO), Take 100 mg by mouth daily. , Disp: , Rfl:  .  diltiazem (CARTIA XT) 240 MG 24 hr capsule, Take 1 capsule (240 mg total) by mouth daily., Disp: 90 capsule, Rfl: 4 .  gabapentin (NEURONTIN) 100 MG capsule, Take 2 capsules (200 mg total) by mouth 2 (two) times daily., Disp: 120 capsule, Rfl: 1 .  Krill Oil 300 MG CAPS, Take 350 mg by mouth daily. , Disp: , Rfl:  .  Mag Aspart-Potassium Aspart (POTASSIUM & MAGNESIUM  ASPARTAT PO), Take 1 tablet by mouth at bedtime. , Disp: , Rfl:  .  metoprolol tartrate (LOPRESSOR) 25 MG tablet, Take 1 tablet (25 mg total) by mouth 2 (two) times daily., Disp: 180 tablet, Rfl: 4 .  oxyCODONE (OXY IR/ROXICODONE) 5 MG immediate release tablet, Take 1-2 tablets (5-10 mg total) by mouth every 4 (four) hours as needed for severe pain., Disp: 60 tablet, Rfl: 0 .  Probiotic Product (PROBIOTIC FORMULA) CAPS, Take 1 capsule by mouth at bedtime. , Disp: , Rfl:  .  traMADol (ULTRAM) 50 MG tablet, Take 1 tablet (50 mg total) by mouth every 8 (eight) hours as needed for moderate pain., Disp: 30 tablet, Rfl: 2 .  TURMERIC PO, Take 1 capsule by mouth 2 (two) times daily. Takes 800 mg in the morning and 500 mg in the evening, Disp: , Rfl:  .  XARELTO 20 MG TABS tablet, TAKE ONE TABLEY BY MOUTH DAILY WITH SUPPER, Disp: 90 tablet, Rfl: 4  Review of Systems  Constitutional: Positive for fatigue. Negative for appetite change, chills and fever.  Respiratory: Negative for chest tightness and shortness of breath.   Cardiovascular: Negative for chest pain and palpitations.  Gastrointestinal: Negative for abdominal pain, nausea and vomiting.  Musculoskeletal: Positive for back pain.  Neurological: Positive for weakness (in legs). Negative for dizziness.    Social History  Substance Use Topics  . Smoking status: Former Research scientist (life sciences)  . Smokeless tobacco: Never Used  . Alcohol use No     Comment: wine- occasionally   Objective:   BP 104/60 (BP Location: Right Arm, Patient Position: Sitting, Cuff Size: Normal)   Pulse 66   Temp 97.6 F (36.4 C) (Oral)   Resp 16   Wt 163 lb (73.9 kg)   SpO2 98% Comment: room air  BMI 30.80 kg/m  There were no vitals filed for this visit.   Physical Exam   General Appearance:    Alert, cooperative, no distress  Eyes:    PERRL, conjunctiva/corneas clear, EOM's intact       Lungs:     Clear to auscultation bilaterally, respirations unlabored  Heart:     Regular rate and rhythm  Neurologic:   Awake, alert, oriented x 3. No apparent focal neurological           defect.       Results for orders placed or performed in visit on 03/14/17  POCT urinalysis dipstick  Result Value Ref Range   Color, UA amber    Clarity, UA clear    Glucose, UA negative    Bilirubin, UA negative    Ketones, UA negative    Spec Grav, UA 1.025 1.010 - 1.025   Blood, UA Trace (Non-hemolyzed)    pH, UA 6.0 5.0 - 8.0   Protein, UA  Trace    Urobilinogen, UA 0.2 0.2 or 1.0 E.U./dL   Nitrite, UA negative    Leukocytes, UA Negative Negative       Assessment & Plan:     1. Urinary frequency Discussed trial of anticholinergic which she will think about.  - POCT urinalysis dipstick  2. HYPERTENSION, BENIGN Well controlled.  . Having some dizzy spells when she takes metoprolol BID. Advised she may reduce to one in the morning and continue diltiazem in the pm.   3. Chronic atrial fibrillation (HCC) Rate well controlled, continue Xarelto.  - metoprolol tartrate (LOPRESSOR) 25 MG tablet; Take 1 tablet (25 mg total) by mouth daily.  Dispense: 1 tablet; Refill: 1  4. Chronic bilateral low back pain without sciatica   5. Chronic pain of lower extremity, unspecified laterality Oxycodone not helping and didn't improved with injections by Dr. Sharlet Salina.  - HYDROcodone-acetaminophen (NORCO/VICODIN) 5-325 MG tablet; Take 1 tablet by mouth every 6 (six) hours as needed for moderate pain.  Dispense: 30 tablet; Refill: 0  6. Urinary tract infection symptoms.  - Urine Culture     The entirety of the information documented in the History of Present Illness, Review of Systems and Physical Exam were personally obtained by me. Portions of this information were initially documented by Meyer Cory, CMA and reviewed by me for thoroughness and accuracy.    Lelon Huh, MD  [facility]

## 2017-03-16 ENCOUNTER — Telehealth: Payer: Self-pay

## 2017-03-16 LAB — URINE CULTURE

## 2017-03-16 MED ORDER — OXYBUTYNIN CHLORIDE ER 5 MG PO TB24: ORAL_TABLET | ORAL | 3 refills | Status: DC

## 2017-03-16 NOTE — Telephone Encounter (Signed)
Advised patient as below. Medication was sent into the pharmacy.  

## 2017-03-16 NOTE — Telephone Encounter (Signed)
-----   Message from Birdie Sons, MD sent at 03/16/2017  8:16 AM EDT ----- No sign of infection on urine culture, if she like she can try oxybutynin 2.5mg  BID for urinary frequency, #60,rf x 3.

## 2017-04-10 ENCOUNTER — Other Ambulatory Visit: Payer: PPO

## 2017-04-11 ENCOUNTER — Encounter
Admission: RE | Admit: 2017-04-11 | Discharge: 2017-04-11 | Disposition: A | Discharge status: Home / Self Care | Payer: PPO | Source: Ambulatory Visit | Attending: Orthopedic Surgery | Admitting: Orthopedic Surgery

## 2017-04-11 ENCOUNTER — Other Ambulatory Visit: Payer: Self-pay | Admitting: Family Medicine

## 2017-04-11 DIAGNOSIS — R5383 Other fatigue: Secondary | ICD-10-CM | POA: Diagnosis not present

## 2017-04-11 DIAGNOSIS — M79606 Pain in leg, unspecified: Secondary | ICD-10-CM | POA: Diagnosis not present

## 2017-04-11 DIAGNOSIS — Z01812 Encounter for preprocedural laboratory examination: Secondary | ICD-10-CM | POA: Insufficient documentation

## 2017-04-11 DIAGNOSIS — Z86718 Personal history of other venous thrombosis and embolism: Secondary | ICD-10-CM | POA: Insufficient documentation

## 2017-04-11 DIAGNOSIS — I10 Essential (primary) hypertension: Secondary | ICD-10-CM | POA: Diagnosis not present

## 2017-04-11 DIAGNOSIS — R569 Unspecified convulsions: Secondary | ICD-10-CM | POA: Diagnosis not present

## 2017-04-11 DIAGNOSIS — M810 Age-related osteoporosis without current pathological fracture: Secondary | ICD-10-CM | POA: Diagnosis not present

## 2017-04-11 DIAGNOSIS — G8929 Other chronic pain: Secondary | ICD-10-CM | POA: Insufficient documentation

## 2017-04-11 DIAGNOSIS — I671 Cerebral aneurysm, nonruptured: Secondary | ICD-10-CM | POA: Diagnosis not present

## 2017-04-11 DIAGNOSIS — I4891 Unspecified atrial fibrillation: Secondary | ICD-10-CM | POA: Insufficient documentation

## 2017-04-11 HISTORY — DX: Dyspnea, unspecified: R06.00

## 2017-04-11 HISTORY — DX: Bladder disorder, unspecified: N32.9

## 2017-04-11 LAB — APTT: aPTT: 34 seconds (ref 24–36)

## 2017-04-11 LAB — SURGICAL PCR SCREEN
MRSA, PCR: NEGATIVE
Staphylococcus aureus: NEGATIVE

## 2017-04-11 LAB — URINALYSIS, MICROSCOPIC (REFLEX): Bacteria, UA: NONE SEEN

## 2017-04-11 LAB — COMPREHENSIVE METABOLIC PANEL
ALT: 22 U/L (ref 14–54)
AST: 26 U/L (ref 15–41)
Albumin: 4.5 g/dL (ref 3.5–5.0)
Alkaline Phosphatase: 40 U/L (ref 38–126)
Anion gap: 6 (ref 5–15)
BUN: 20 mg/dL (ref 6–20)
CO2: 27 mmol/L (ref 22–32)
Calcium: 9.5 mg/dL (ref 8.9–10.3)
Chloride: 101 mmol/L (ref 101–111)
Creatinine, Ser: 0.76 mg/dL (ref 0.44–1.00)
GFR calc Af Amer: >60 mL/min (ref >60)
GFR calc non Af Amer: >60 mL/min (ref >60)
Glucose, Bld: 88 mg/dL (ref 65–99)
Potassium: 4 mmol/L (ref 3.5–5.1)
Sodium: 134 mmol/L — ABNORMAL LOW (ref 135–145)
Total Bilirubin: 0.9 mg/dL (ref 0.3–1.2)
Total Protein: 7.4 g/dL (ref 6.5–8.1)

## 2017-04-11 LAB — TYPE AND SCREEN
ABO/RH(D): A POS
Antibody Screen: NEGATIVE

## 2017-04-11 LAB — SEDIMENTATION RATE: Sed Rate: 8 mm/hr (ref 0–30)

## 2017-04-11 LAB — URINALYSIS, ROUTINE W REFLEX MICROSCOPIC
Glucose, UA: NEGATIVE mg/dL
Leukocytes, UA: NEGATIVE
Nitrite: NEGATIVE
Protein, ur: 30 mg/dL — AB
Specific Gravity, Urine: 1.02 (ref 1.005–1.030)
pH: 6.5 (ref 5.0–8.0)

## 2017-04-11 LAB — CBC
HCT: 44.3 % (ref 35.0–47.0)
Hemoglobin: 15 g/dL (ref 12.0–16.0)
MCH: 32.3 pg (ref 26.0–34.0)
MCHC: 33.8 g/dL (ref 32.0–36.0)
MCV: 95.6 fL (ref 80.0–100.0)
Platelets: 191 10*3/uL (ref 150–440)
RBC: 4.64 MIL/uL (ref 3.80–5.20)
RDW: 13.2 % (ref 11.5–14.5)
WBC: 7.2 10*3/uL (ref 3.6–11.0)

## 2017-04-11 LAB — C-REACTIVE PROTEIN: CRP: <0.8 mg/dL (ref <1.0)

## 2017-04-11 LAB — PROTIME-INR
INR: 1.78
Prothrombin Time: 20.9 seconds — ABNORMAL HIGH (ref 11.4–15.2)

## 2017-04-11 MED ORDER — OXYBUTYNIN CHLORIDE ER 5 MG PO TB24: ORAL_TABLET | ORAL | 11 refills | Status: DC

## 2017-04-11 MED ORDER — GABAPENTIN 100 MG PO CAPS: 200.0000 mg | ORAL_CAPSULE | Freq: Two times a day (BID) | ORAL | 6 refills | Status: DC

## 2017-04-11 NOTE — Telephone Encounter (Signed)
Pt needs refill on her   gabapentin (NEURONTIN) 100 MG capsule  oxybutynin (DITROPAN-XL) 5 MG 24 hr tablet  She uses [facility]

## 2017-04-11 NOTE — Patient Instructions (Signed)
Your procedure is scheduled on: 04/23/17 Mon Report to Same Day Surgery 2nd floor medical mall [facility] Entrance-take elevator on left to 2nd floor.  Check in with surgery information desk.) To find out your arrival time please call [phone] between 1PM - 3PM on 04/20/17 Fri  Remember: Instructions that are not followed completely may result in serious medical risk, up to and including death, or upon the discretion of your surgeon and anesthesiologist your surgery may need to be rescheduled.    _x___ 1. Do not eat food or drink liquids after midnight. No gum chewing or                              hard candies.     __x__ 2. No Alcohol for 24 hours before or after surgery.   __x__3. No Smoking for 24 prior to surgery.   ____  4. Bring all medications with you on the day of surgery if instructed.    __x__ 5. Notify your doctor if there is any change in your medical condition     (cold, fever, infections).     Do not wear jewelry, make-up, hairpins, clips or nail polish.  Do not wear lotions, powders, or perfumes. You may wear deodorant.  Do not shave 48 hours prior to surgery. Men may shave face and neck.  Do not bring valuables to the hospital.    [facility] is not responsible for any belongings or valuables.               Contacts, dentures or bridgework may not be worn into surgery.  Leave your suitcase in the car. After surgery it may be brought to your room.  For patients admitted to the hospital, discharge time is determined by your                       treatment team.   Patients discharged the day of surgery will not be allowed to drive home.  You will need someone to drive you home and stay with you the night of your procedure.    Please read over the following fact sheets that you were given:   [facility] Preparing for Surgery and or MRSA Information   _x___ Take anti-hypertensive (unless it includes a diuretic), cardiac, seizure, asthma,     anti-reflux and  psychiatric medicines. These include:  1. gabapentin (NEURONTIN  2.metoprolol tartrate   3.  4.  5.  6.  ____Fleets enema or Magnesium Citrate as directed.   _x___ Use CHG Soap or sage wipes as directed on instruction sheet   ____ Use inhalers on the day of surgery and bring to hospital day of surgery  ____ Stop Metformin and Janumet 2 days prior to surgery.    ____ Take 1/2 of usual insulin dose the night before surgery and none on the morning     surgery.   _x___ Follow recommendations from Cardiologist, Pulmonologist or PCP regarding          stopping Aspirin, Coumadin, Pllavix ,Eliquis, Effient, or Pradaxa, and Pletal.   Follow Dr Clydell Hakim order regarding Xarelto  X____Stop Anti-inflammatories such as Advil, Aleve, Ibuprofen, Motrin, Naproxen, Naprosyn, Goodies powders or aspirin products. OK to take Tylenol and                          Celebrex.   _x___ Stop supplements  until after surgery.  But may continue Vitamin D, Vitamin B,       and multivitamin.   ____ Bring C-Pap to the hospital.

## 2017-04-12 ENCOUNTER — Other Ambulatory Visit: Payer: Self-pay | Admitting: Family Medicine

## 2017-04-12 DIAGNOSIS — M79606 Pain in leg, unspecified: Principal | ICD-10-CM

## 2017-04-12 DIAGNOSIS — G8929 Other chronic pain: Secondary | ICD-10-CM

## 2017-04-12 LAB — URINE CULTURE
Culture: <10000 — AB
Special Requests: NORMAL

## 2017-04-12 MED ORDER — HYDROCODONE-ACETAMINOPHEN 5-325 MG PO TABS: 1.0000 | ORAL_TABLET | Freq: Four times a day (QID) | ORAL | 0 refills | Status: DC | PRN

## 2017-04-22 MED ORDER — TRANEXAMIC ACID 1000 MG/10ML IV SOLN
1000.0000 mg | INTRAVENOUS | Status: AC
  Administered 2017-04-23: 1000 mg via INTRAVENOUS
  Filled 2017-04-22: qty 10

## 2017-04-22 MED ORDER — CEFAZOLIN SODIUM-DEXTROSE 2-4 GM/100ML-% IV SOLN
2.0000 g | INTRAVENOUS | Status: AC
  Administered 2017-04-23: 2 g via INTRAVENOUS

## 2017-04-23 ENCOUNTER — Inpatient Hospital Stay: Payer: PPO

## 2017-04-23 ENCOUNTER — Encounter
Admission: RE | Disposition: A | Discharge status: Skilled Nursing Facility | Payer: Self-pay | Source: Ambulatory Visit | Attending: Orthopedic Surgery

## 2017-04-23 ENCOUNTER — Encounter: Payer: Self-pay | Admitting: Orthopedic Surgery

## 2017-04-23 ENCOUNTER — Inpatient Hospital Stay
Admission: RE | Admit: 2017-04-23 | Discharge: 2017-04-25 | DRG: 470 | Disposition: A | Discharge status: Skilled Nursing Facility | Payer: PPO | Source: Ambulatory Visit | Attending: Orthopedic Surgery | Admitting: Orthopedic Surgery

## 2017-04-23 ENCOUNTER — Inpatient Hospital Stay: Payer: PPO | Admitting: Anesthesiology

## 2017-04-23 DIAGNOSIS — Z79899 Other long term (current) drug therapy: Secondary | ICD-10-CM

## 2017-04-23 DIAGNOSIS — Z471 Aftercare following joint replacement surgery: Secondary | ICD-10-CM | POA: Diagnosis not present

## 2017-04-23 DIAGNOSIS — Z87891 Personal history of nicotine dependence: Secondary | ICD-10-CM

## 2017-04-23 DIAGNOSIS — R350 Frequency of micturition: Secondary | ICD-10-CM | POA: Diagnosis not present

## 2017-04-23 DIAGNOSIS — Z86718 Personal history of other venous thrombosis and embolism: Secondary | ICD-10-CM

## 2017-04-23 DIAGNOSIS — Z886 Allergy status to analgesic agent status: Secondary | ICD-10-CM

## 2017-04-23 DIAGNOSIS — R569 Unspecified convulsions: Secondary | ICD-10-CM | POA: Diagnosis present

## 2017-04-23 DIAGNOSIS — Z96651 Presence of right artificial knee joint: Secondary | ICD-10-CM | POA: Diagnosis not present

## 2017-04-23 DIAGNOSIS — M6281 Muscle weakness (generalized): Secondary | ICD-10-CM | POA: Diagnosis not present

## 2017-04-23 DIAGNOSIS — M1712 Unilateral primary osteoarthritis, left knee: Secondary | ICD-10-CM | POA: Diagnosis not present

## 2017-04-23 DIAGNOSIS — Z7901 Long term (current) use of anticoagulants: Secondary | ICD-10-CM

## 2017-04-23 DIAGNOSIS — R262 Difficulty in walking, not elsewhere classified: Secondary | ICD-10-CM | POA: Diagnosis not present

## 2017-04-23 DIAGNOSIS — Z96659 Presence of unspecified artificial knee joint: Secondary | ICD-10-CM

## 2017-04-23 DIAGNOSIS — Z8673 Personal history of transient ischemic attack (TIA), and cerebral infarction without residual deficits: Secondary | ICD-10-CM | POA: Diagnosis not present

## 2017-04-23 DIAGNOSIS — Z96642 Presence of left artificial hip joint: Secondary | ICD-10-CM | POA: Diagnosis not present

## 2017-04-23 DIAGNOSIS — I482 Chronic atrial fibrillation: Secondary | ICD-10-CM | POA: Diagnosis not present

## 2017-04-23 DIAGNOSIS — M810 Age-related osteoporosis without current pathological fracture: Secondary | ICD-10-CM | POA: Diagnosis not present

## 2017-04-23 DIAGNOSIS — I10 Essential (primary) hypertension: Secondary | ICD-10-CM | POA: Diagnosis not present

## 2017-04-23 DIAGNOSIS — R0602 Shortness of breath: Secondary | ICD-10-CM | POA: Diagnosis not present

## 2017-04-23 DIAGNOSIS — M25562 Pain in left knee: Secondary | ICD-10-CM | POA: Diagnosis present

## 2017-04-23 HISTORY — PX: KNEE ARTHROPLASTY: SHX992

## 2017-04-23 LAB — PROTIME-INR
INR: 1.98
Prothrombin Time: 22.8 seconds — ABNORMAL HIGH (ref 11.4–15.2)

## 2017-04-23 SURGERY — ARTHROPLASTY, KNEE, TOTAL, USING IMAGELESS COMPUTER-ASSISTED NAVIGATION
Anesthesia: General | Site: Knee | Laterality: Left | Wound class: Clean

## 2017-04-23 MED ORDER — FERROUS SULFATE 325 (65 FE) MG PO TABS
325.0000 mg | ORAL_TABLET | Freq: Two times a day (BID) | ORAL | Status: DC
  Administered 2017-04-23 – 2017-04-25 (×4): 325 mg via ORAL
  Filled 2017-04-23 (×4): qty 1

## 2017-04-23 MED ORDER — ROCURONIUM BROMIDE 50 MG/5ML IV SOLN
INTRAVENOUS | Status: AC
  Filled 2017-04-23: qty 1

## 2017-04-23 MED ORDER — GLYCOPYRROLATE 0.2 MG/ML IJ SOLN
INTRAMUSCULAR | Status: AC
  Filled 2017-04-23: qty 1

## 2017-04-23 MED ORDER — METOCLOPRAMIDE HCL 10 MG PO TABS
10.0000 mg | ORAL_TABLET | Freq: Three times a day (TID) | ORAL | Status: DC
  Administered 2017-04-23 – 2017-04-25 (×7): 10 mg via ORAL
  Filled 2017-04-23 (×7): qty 1

## 2017-04-23 MED ORDER — RIVAROXABAN 20 MG PO TABS
20.0000 mg | ORAL_TABLET | Freq: Every day | ORAL | Status: DC
  Administered 2017-04-24: 20 mg via ORAL
  Filled 2017-04-23 (×3): qty 1

## 2017-04-23 MED ORDER — MAGNESIUM HYDROXIDE 400 MG/5ML PO SUSP
30.0000 mL | Freq: Every day | ORAL | Status: DC | PRN
  Administered 2017-04-23 – 2017-04-25 (×2): 30 mL via ORAL
  Filled 2017-04-23 (×2): qty 30

## 2017-04-23 MED ORDER — BISACODYL 10 MG RE SUPP: 10.0000 mg | Freq: Every day | RECTAL | Status: DC | PRN

## 2017-04-23 MED ORDER — SODIUM CHLORIDE 0.9 % IJ SOLN
INTRAMUSCULAR | Status: AC
  Filled 2017-04-23: qty 50

## 2017-04-23 MED ORDER — SUGAMMADEX SODIUM 200 MG/2ML IV SOLN
INTRAVENOUS | Status: DC | PRN
  Administered 2017-04-23: 150 mg via INTRAVENOUS

## 2017-04-23 MED ORDER — FENTANYL CITRATE (PF) 100 MCG/2ML IJ SOLN
INTRAMUSCULAR | Status: AC
  Administered 2017-04-23: 25 ug via INTRAVENOUS
  Filled 2017-04-23: qty 2

## 2017-04-23 MED ORDER — ALUM & MAG HYDROXIDE-SIMETH 200-200-20 MG/5ML PO SUSP: 30.0000 mL | ORAL | Status: DC | PRN

## 2017-04-23 MED ORDER — FENTANYL CITRATE (PF) 250 MCG/5ML IJ SOLN
INTRAMUSCULAR | Status: AC
  Filled 2017-04-23: qty 5

## 2017-04-23 MED ORDER — ACETAMINOPHEN 10 MG/ML IV SOLN
INTRAVENOUS | Status: AC
  Filled 2017-04-23: qty 100

## 2017-04-23 MED ORDER — SUGAMMADEX SODIUM 200 MG/2ML IV SOLN
INTRAVENOUS | Status: AC
  Filled 2017-04-23: qty 2

## 2017-04-23 MED ORDER — DIPHENHYDRAMINE HCL 12.5 MG/5ML PO ELIX: 12.5000 mg | ORAL_SOLUTION | ORAL | Status: DC | PRN

## 2017-04-23 MED ORDER — ONDANSETRON HCL 4 MG/2ML IJ SOLN
INTRAMUSCULAR | Status: AC
  Filled 2017-04-23: qty 2

## 2017-04-23 MED ORDER — PHENYLEPHRINE HCL 10 MG/ML IJ SOLN
INTRAMUSCULAR | Status: AC
  Filled 2017-04-23: qty 1

## 2017-04-23 MED ORDER — VITAMIN D 1000 UNITS PO TABS
1000.0000 [IU] | ORAL_TABLET | Freq: Every day | ORAL | Status: DC
  Administered 2017-04-24 – 2017-04-25 (×2): 1000 [IU] via ORAL
  Filled 2017-04-23 (×2): qty 1

## 2017-04-23 MED ORDER — OXYCODONE HCL 5 MG PO TABS
5.0000 mg | ORAL_TABLET | ORAL | Status: DC | PRN
  Administered 2017-04-23 (×2): 5 mg via ORAL
  Administered 2017-04-24 – 2017-04-25 (×2): 10 mg via ORAL
  Filled 2017-04-23: qty 1
  Filled 2017-04-23 (×2): qty 2
  Filled 2017-04-23: qty 1

## 2017-04-23 MED ORDER — SODIUM CHLORIDE 0.9 % IV SOLN
INTRAVENOUS | Status: DC
  Administered 2017-04-23 – 2017-04-24 (×2): via INTRAVENOUS

## 2017-04-23 MED ORDER — LIDOCAINE HCL (CARDIAC) 20 MG/ML IV SOLN
INTRAVENOUS | Status: DC | PRN
  Administered 2017-04-23: 100 mg via INTRAVENOUS

## 2017-04-23 MED ORDER — METOPROLOL TARTRATE 25 MG PO TABS
25.0000 mg | ORAL_TABLET | Freq: Every day | ORAL | Status: DC
  Administered 2017-04-23 – 2017-04-25 (×3): 25 mg via ORAL
  Filled 2017-04-23 (×3): qty 1

## 2017-04-23 MED ORDER — LACTATED RINGERS IV SOLN
INTRAVENOUS | Status: DC
  Administered 2017-04-23 (×2): via INTRAVENOUS

## 2017-04-23 MED ORDER — ACETAMINOPHEN 10 MG/ML IV SOLN
INTRAVENOUS | Status: DC | PRN
  Administered 2017-04-23: 1000 mg via INTRAVENOUS

## 2017-04-23 MED ORDER — TRANEXAMIC ACID 1000 MG/10ML IV SOLN
1000.0000 mg | Freq: Once | INTRAVENOUS | Status: AC
  Administered 2017-04-23: 1000 mg via INTRAVENOUS
  Filled 2017-04-23: qty 10

## 2017-04-23 MED ORDER — FENTANYL CITRATE (PF) 100 MCG/2ML IJ SOLN
INTRAMUSCULAR | Status: AC
  Filled 2017-04-23: qty 2

## 2017-04-23 MED ORDER — KETAMINE HCL 50 MG/ML IJ SOLN
INTRAMUSCULAR | Status: AC
  Filled 2017-04-23: qty 10

## 2017-04-23 MED ORDER — DILTIAZEM HCL ER COATED BEADS 240 MG PO CP24
240.0000 mg | ORAL_CAPSULE | Freq: Every day | ORAL | Status: DC
  Administered 2017-04-24: 240 mg via ORAL
  Filled 2017-04-23 (×3): qty 1

## 2017-04-23 MED ORDER — MORPHINE SULFATE (PF) 2 MG/ML IV SOLN: 2.0000 mg | INTRAVENOUS | Status: DC | PRN

## 2017-04-23 MED ORDER — PHENYLEPHRINE HCL 10 MG/ML IJ SOLN
INTRAMUSCULAR | Status: DC | PRN
  Administered 2017-04-23: 100 ug via INTRAVENOUS

## 2017-04-23 MED ORDER — FAMOTIDINE 20 MG PO TABS
20.0000 mg | ORAL_TABLET | Freq: Once | ORAL | Status: AC
  Administered 2017-04-23: 20 mg via ORAL

## 2017-04-23 MED ORDER — PANTOPRAZOLE SODIUM 40 MG PO TBEC
40.0000 mg | DELAYED_RELEASE_TABLET | Freq: Two times a day (BID) | ORAL | Status: DC
  Administered 2017-04-23 – 2017-04-25 (×4): 40 mg via ORAL
  Filled 2017-04-23 (×4): qty 1

## 2017-04-23 MED ORDER — BUPIVACAINE LIPOSOME 1.3 % IJ SUSP
INTRAMUSCULAR | Status: AC
  Filled 2017-04-23: qty 20

## 2017-04-23 MED ORDER — GABAPENTIN 100 MG PO CAPS
200.0000 mg | ORAL_CAPSULE | Freq: Two times a day (BID) | ORAL | Status: DC
  Administered 2017-04-23 – 2017-04-25 (×4): 200 mg via ORAL
  Filled 2017-04-23 (×4): qty 2

## 2017-04-23 MED ORDER — LIDOCAINE HCL (PF) 2 % IJ SOLN
INTRAMUSCULAR | Status: AC
  Filled 2017-04-23: qty 2

## 2017-04-23 MED ORDER — CEFAZOLIN SODIUM-DEXTROSE 2-4 GM/100ML-% IV SOLN
2.0000 g | Freq: Four times a day (QID) | INTRAVENOUS | Status: AC
  Administered 2017-04-23 – 2017-04-24 (×4): 2 g via INTRAVENOUS
  Filled 2017-04-23 (×4): qty 100

## 2017-04-23 MED ORDER — ONDANSETRON HCL 4 MG/2ML IJ SOLN
INTRAMUSCULAR | Status: DC | PRN
  Administered 2017-04-23: 4 mg via INTRAVENOUS

## 2017-04-23 MED ORDER — CALCIUM CARBONATE-VITAMIN D 500-200 MG-UNIT PO TABS
1.0000 | ORAL_TABLET | Freq: Two times a day (BID) | ORAL | Status: DC
  Administered 2017-04-23 – 2017-04-25 (×4): 1 via ORAL
  Filled 2017-04-23 (×4): qty 1

## 2017-04-23 MED ORDER — DEXAMETHASONE SODIUM PHOSPHATE 4 MG/ML IJ SOLN
INTRAMUSCULAR | Status: DC | PRN
  Administered 2017-04-23: 5 mg via INTRAVENOUS

## 2017-04-23 MED ORDER — SODIUM CHLORIDE 0.9 % IV SOLN
INTRAVENOUS | Status: DC | PRN
  Administered 2017-04-23: 60 mL

## 2017-04-23 MED ORDER — PROPOFOL 10 MG/ML IV BOLUS
INTRAVENOUS | Status: DC | PRN
  Administered 2017-04-23: 110 mg via INTRAVENOUS
  Administered 2017-04-23: 10 mg via INTRAVENOUS
  Administered 2017-04-23: 20 mg via INTRAVENOUS

## 2017-04-23 MED ORDER — TRAMADOL HCL 50 MG PO TABS
50.0000 mg | ORAL_TABLET | ORAL | Status: DC | PRN
  Administered 2017-04-23 – 2017-04-25 (×2): 100 mg via ORAL
  Filled 2017-04-23 (×2): qty 2

## 2017-04-23 MED ORDER — ACETAMINOPHEN 325 MG PO TABS
650.0000 mg | ORAL_TABLET | Freq: Four times a day (QID) | ORAL | Status: DC | PRN
Start: 2017-04-23 — End: 2017-04-25
  Administered 2017-04-24 – 2017-04-25 (×2): 650 mg via ORAL
  Filled 2017-04-23 (×4): qty 2

## 2017-04-23 MED ORDER — RISAQUAD PO CAPS
1.0000 | ORAL_CAPSULE | Freq: Every evening | ORAL | Status: DC
  Administered 2017-04-23 – 2017-04-24 (×2): 1 via ORAL
  Filled 2017-04-23 (×3): qty 1

## 2017-04-23 MED ORDER — ACETAMINOPHEN 10 MG/ML IV SOLN
1000.0000 mg | Freq: Four times a day (QID) | INTRAVENOUS | Status: AC
  Administered 2017-04-23 – 2017-04-24 (×4): 1000 mg via INTRAVENOUS
  Filled 2017-04-23 (×4): qty 100

## 2017-04-23 MED ORDER — FAMOTIDINE 20 MG PO TABS
ORAL_TABLET | ORAL | Status: AC
  Filled 2017-04-23: qty 1

## 2017-04-23 MED ORDER — DEXAMETHASONE SODIUM PHOSPHATE 10 MG/ML IJ SOLN
INTRAMUSCULAR | Status: AC
Start: 2017-04-23 — End: 2017-04-23
  Filled 2017-04-23: qty 1

## 2017-04-23 MED ORDER — ROCURONIUM BROMIDE 100 MG/10ML IV SOLN
INTRAVENOUS | Status: DC | PRN
  Administered 2017-04-23: 50 mg via INTRAVENOUS
  Administered 2017-04-23: 30 mg via INTRAVENOUS

## 2017-04-23 MED ORDER — FENTANYL CITRATE (PF) 100 MCG/2ML IJ SOLN
25.0000 ug | INTRAMUSCULAR | Status: DC | PRN
  Administered 2017-04-23 (×2): 25 ug via INTRAVENOUS

## 2017-04-23 MED ORDER — KRILL OIL 300 MG PO CAPS: 350.0000 mg | ORAL_CAPSULE | Freq: Every day | ORAL | Status: DC

## 2017-04-23 MED ORDER — PROPOFOL 10 MG/ML IV BOLUS
INTRAVENOUS | Status: AC
  Filled 2017-04-23: qty 20

## 2017-04-23 MED ORDER — BUPIVACAINE HCL (PF) 0.25 % IJ SOLN
INTRAMUSCULAR | Status: DC | PRN
Start: 2017-04-23 — End: 2017-04-23
  Administered 2017-04-23: 60 mL

## 2017-04-23 MED ORDER — SENNOSIDES-DOCUSATE SODIUM 8.6-50 MG PO TABS
1.0000 | ORAL_TABLET | Freq: Two times a day (BID) | ORAL | Status: DC
  Administered 2017-04-23 – 2017-04-25 (×4): 1 via ORAL
  Filled 2017-04-23 (×4): qty 1

## 2017-04-23 MED ORDER — MENTHOL 3 MG MT LOZG
1.0000 | LOZENGE | OROMUCOSAL | Status: DC | PRN
  Filled 2017-04-23: qty 9

## 2017-04-23 MED ORDER — ONDANSETRON HCL 4 MG/2ML IJ SOLN: 4.0000 mg | Freq: Once | INTRAMUSCULAR | Status: DC | PRN

## 2017-04-23 MED ORDER — ALENDRONATE SODIUM 70 MG PO TABS: 70.0000 mg | ORAL_TABLET | ORAL | Status: DC

## 2017-04-23 MED ORDER — ONDANSETRON HCL 4 MG PO TABS: 4.0000 mg | ORAL_TABLET | Freq: Four times a day (QID) | ORAL | Status: DC | PRN

## 2017-04-23 MED ORDER — CHLORHEXIDINE GLUCONATE 4 % EX LIQD: 60.0000 mL | Freq: Once | CUTANEOUS | Status: DC

## 2017-04-23 MED ORDER — NEOMYCIN-POLYMYXIN B GU 40-200000 IR SOLN
Status: AC
  Filled 2017-04-23: qty 20

## 2017-04-23 MED ORDER — FLEET ENEMA 7-19 GM/118ML RE ENEM: 1.0000 | ENEMA | Freq: Once | RECTAL | Status: DC | PRN

## 2017-04-23 MED ORDER — HYDROMORPHONE HCL 1 MG/ML IJ SOLN
INTRAMUSCULAR | Status: DC | PRN
  Administered 2017-04-23: 1 mg via INTRAVENOUS

## 2017-04-23 MED ORDER — ONDANSETRON HCL 4 MG/2ML IJ SOLN: 4.0000 mg | Freq: Four times a day (QID) | INTRAMUSCULAR | Status: DC | PRN

## 2017-04-23 MED ORDER — MIDAZOLAM HCL 2 MG/2ML IJ SOLN
INTRAMUSCULAR | Status: AC
  Filled 2017-04-23: qty 2

## 2017-04-23 MED ORDER — HYDROMORPHONE HCL 1 MG/ML IJ SOLN
INTRAMUSCULAR | Status: AC
  Filled 2017-04-23: qty 1

## 2017-04-23 MED ORDER — BUPIVACAINE HCL (PF) 0.25 % IJ SOLN
INTRAMUSCULAR | Status: AC
  Filled 2017-04-23: qty 60

## 2017-04-23 MED ORDER — KETAMINE HCL 50 MG/ML IJ SOLN
INTRAMUSCULAR | Status: DC | PRN
  Administered 2017-04-23: 10 mg via INTRAMUSCULAR
  Administered 2017-04-23: 40 mg via INTRAMUSCULAR
  Administered 2017-04-23: 10 mg via INTRAMUSCULAR

## 2017-04-23 MED ORDER — ACETAMINOPHEN 650 MG RE SUPP: 650.0000 mg | Freq: Four times a day (QID) | RECTAL | Status: DC | PRN

## 2017-04-23 MED ORDER — NEOMYCIN-POLYMYXIN B GU 40-200000 IR SOLN
Status: DC | PRN
  Administered 2017-04-23: 14 mL

## 2017-04-23 MED ORDER — POLYVINYL ALCOHOL 1.4 % OP SOLN
1.0000 [drp] | Freq: Two times a day (BID) | OPHTHALMIC | Status: DC | PRN
  Filled 2017-04-23: qty 15

## 2017-04-23 MED ORDER — PHENOL 1.4 % MT LIQD
1.0000 | OROMUCOSAL | Status: DC | PRN
  Filled 2017-04-23: qty 177

## 2017-04-23 MED ORDER — FENTANYL CITRATE (PF) 100 MCG/2ML IJ SOLN
INTRAMUSCULAR | Status: DC | PRN
  Administered 2017-04-23: 250 ug via INTRAVENOUS
  Administered 2017-04-23: 100 ug via INTRAVENOUS

## 2017-04-23 MED ORDER — PROPOFOL 500 MG/50ML IV EMUL
INTRAVENOUS | Status: AC
  Filled 2017-04-23: qty 50

## 2017-04-23 MED ORDER — OXYBUTYNIN CHLORIDE ER 5 MG PO TB24
5.0000 mg | ORAL_TABLET | Freq: Every day | ORAL | Status: DC
  Administered 2017-04-23 – 2017-04-24 (×2): 5 mg via ORAL
  Filled 2017-04-23 (×2): qty 1

## 2017-04-23 SURGICAL SUPPLY — 66 items
BATTERY INSTRU NAVIGATION (MISCELLANEOUS) ×8 IMPLANT
BLADE CLIPPER SURG (BLADE) ×2 IMPLANT
BLADE SAW 1 (BLADE) ×2 IMPLANT
BLADE SAW 1/2 (BLADE) ×2 IMPLANT
BLADE SAW 70X12.5 (BLADE) IMPLANT
CANISTER SUCT 1200ML W/VALVE (MISCELLANEOUS) ×2 IMPLANT
CANISTER SUCT 3000ML PPV (MISCELLANEOUS) ×4 IMPLANT
CAPT KNEE TOTAL 3 ATTUNE ×2 IMPLANT
CATH TRAY METER 16FR LF (MISCELLANEOUS) ×2 IMPLANT
CEMENT HV SMART SET (Cement) ×4 IMPLANT
COOLER POLAR GLACIER W/PUMP (MISCELLANEOUS) ×2 IMPLANT
CUFF TOURN 24 STER (MISCELLANEOUS) IMPLANT
CUFF TOURN 30 STER DUAL PORT (MISCELLANEOUS) ×2 IMPLANT
DRAPE SHEET LG 3/4 BI-LAMINATE (DRAPES) ×2 IMPLANT
DRSG DERMACEA 8X12 NADH (GAUZE/BANDAGES/DRESSINGS) ×2 IMPLANT
DRSG OPSITE POSTOP 4X14 (GAUZE/BANDAGES/DRESSINGS) ×2 IMPLANT
DRSG TEGADERM 4X4.75 (GAUZE/BANDAGES/DRESSINGS) ×2 IMPLANT
DURAPREP 26ML APPLICATOR (WOUND CARE) ×4 IMPLANT
ELECT CAUTERY BLADE 6.4 (BLADE) ×2 IMPLANT
ELECT REM PT RETURN 9FT ADLT (ELECTROSURGICAL) ×2
ELECTRODE REM PT RTRN 9FT ADLT (ELECTROSURGICAL) ×1 IMPLANT
EX-PIN ORTHOLOCK NAV 4X150 (PIN) ×4 IMPLANT
GLOVE BIO SURGEON STRL SZ 6.5 (GLOVE) ×8 IMPLANT
GLOVE BIOGEL M STRL SZ7.5 (GLOVE) ×4 IMPLANT
GLOVE BIOGEL PI IND STRL 7.0 (GLOVE) ×5 IMPLANT
GLOVE BIOGEL PI IND STRL 9 (GLOVE) ×1 IMPLANT
GLOVE BIOGEL PI INDICATOR 7.0 (GLOVE) ×5
GLOVE BIOGEL PI INDICATOR 9 (GLOVE) ×1
GLOVE INDICATOR 8.0 STRL GRN (GLOVE) ×2 IMPLANT
GLOVE SURG SYN 9.0  PF PI (GLOVE) ×1
GLOVE SURG SYN 9.0 PF PI (GLOVE) ×1 IMPLANT
GOWN STRL REUS W/ TWL LRG LVL3 (GOWN DISPOSABLE) ×4 IMPLANT
GOWN STRL REUS W/TWL 2XL LVL3 (GOWN DISPOSABLE) ×2 IMPLANT
GOWN STRL REUS W/TWL LRG LVL3 (GOWN DISPOSABLE) ×4
HEMOVAC 400CC 10FR (MISCELLANEOUS) ×2 IMPLANT
HOLDER FOLEY CATH W/STRAP (MISCELLANEOUS) ×2 IMPLANT
HOOD PEEL AWAY FLYTE STAYCOOL (MISCELLANEOUS) ×4 IMPLANT
KIT RM TURNOVER STRD PROC AR (KITS) ×2 IMPLANT
KNIFE SCULPS 14X20 (INSTRUMENTS) ×2 IMPLANT
LABEL OR SOLS (LABEL) ×2 IMPLANT
NDL SAFETY 18GX1.5 (NEEDLE) ×2 IMPLANT
NEEDLE SPNL 20GX3.5 QUINCKE YW (NEEDLE) ×2 IMPLANT
NS IRRIG 500ML POUR BTL (IV SOLUTION) ×2 IMPLANT
PACK TOTAL KNEE (MISCELLANEOUS) ×2 IMPLANT
PAD WRAPON POLAR KNEE (MISCELLANEOUS) ×1 IMPLANT
PIN DRILL QUICK PACK ×2 IMPLANT
PIN FIXATION 1/8DIA X 3INL (PIN) ×2 IMPLANT
PULSAVAC PLUS IRRIG FAN TIP (DISPOSABLE) ×2
SOL .9 NS 3000ML IRR  AL (IV SOLUTION) ×1
SOL .9 NS 3000ML IRR UROMATIC (IV SOLUTION) ×1 IMPLANT
SOL PREP PVP 2OZ (MISCELLANEOUS) ×2
SOLUTION PREP PVP 2OZ (MISCELLANEOUS) ×1 IMPLANT
SPONGE DRAIN TRACH 4X4 STRL 2S (GAUZE/BANDAGES/DRESSINGS) ×2 IMPLANT
STAPLER SKIN PROX 35W (STAPLE) ×2 IMPLANT
STRAP TIBIA SHORT (MISCELLANEOUS) ×2 IMPLANT
SUCTION FRAZIER HANDLE 10FR (MISCELLANEOUS) ×1
SUCTION TUBE FRAZIER 10FR DISP (MISCELLANEOUS) ×1 IMPLANT
SUT VIC AB 0 CT1 36 (SUTURE) ×2 IMPLANT
SUT VIC AB 1 CT1 36 (SUTURE) ×4 IMPLANT
SUT VIC AB 2-0 CT2 27 (SUTURE) ×2 IMPLANT
SYR 20CC LL (SYRINGE) ×2 IMPLANT
SYR 30ML LL (SYRINGE) ×4 IMPLANT
TIP FAN IRRIG PULSAVAC PLUS (DISPOSABLE) ×1 IMPLANT
TOWEL OR 17X26 4PK STRL BLUE (TOWEL DISPOSABLE) ×2 IMPLANT
TOWER CARTRIDGE SMART MIX (DISPOSABLE) ×2 IMPLANT
WRAPON POLAR PAD KNEE (MISCELLANEOUS) ×2

## 2017-04-23 NOTE — Progress Notes (Signed)
CH. responded to OR from Pt. requested information on AD. CH. explained the process to Pt. and her sister.

## 2017-04-23 NOTE — Op Note (Signed)
OPERATIVE NOTE  DATE OF SURGERY:  04/23/2017  PATIENT NAME:  [name] [name]   DOB: 09-Apr-1941  MRN: [id number]  PRE-OPERATIVE DIAGNOSIS: Degenerative arthrosis of the left knee, primary  POST-OPERATIVE DIAGNOSIS:  Same  PROCEDURE:  Left total knee arthroplasty using computer-assisted navigation  SURGEON:  Marciano Sequin. M.D.  ASSISTANT:  Vance Peper, PA (present and scrubbed throughout the case, critical for assistance with exposure, retraction, instrumentation, and closure)  ANESTHESIA: general  ESTIMATED BLOOD LOSS: 100 mL  FLUIDS REPLACED: 1500 mL of crystalloid  TOURNIQUET TIME: 73 minutes  DRAINS: 2 medium Hemovac drains  SOFT TISSUE RELEASES: Anterior cruciate ligament, posterior cruciate ligament, deep medial collateral ligament, patellofemoral ligament  IMPLANTS UTILIZED: DePuy Attune size 6 posterior stabilized femoral component (cemented), size 5 rotating platform tibial component (cemented), 35 mm medialized dome patella (cemented), and a 10 mm stabilized rotating platform polyethylene insert.  INDICATIONS FOR SURGERY: [name] [name] is a 76 y.o. year old female with a long history of progressive knee pain. X-rays demonstrated severe degenerative changes in tricompartmental fashion. The patient had not seen any significant improvement despite conservative nonsurgical intervention. After discussion of the risks and benefits of surgical intervention, the patient expressed understanding of the risks benefits and agree with plans for total knee arthroplasty.   The risks, benefits, and alternatives were discussed at length including but not limited to the risks of infection, bleeding, nerve injury, stiffness, blood clots, the need for revision surgery, cardiopulmonary complications, among others, and they were willing to proceed.  PROCEDURE IN DETAIL: The patient was brought into the operating room and, after adequate general anesthesia was achieved, a tourniquet  was placed on the patient's upper thigh. The patient's knee and leg were cleaned and prepped with alcohol and DuraPrep and draped in the usual sterile fashion. A "timeout" was performed as per usual protocol. The lower extremity was exsanguinated using an Esmarch, and the tourniquet was inflated to 300 mmHg. An anterior longitudinal incision was made followed by a standard mid vastus approach. The deep fibers of the medial collateral ligament were elevated in a subperiosteal fashion off of the medial flare of the tibia so as to maintain a continuous soft tissue sleeve. The patella was subluxed laterally and the patellofemoral ligament was incised. Inspection of the knee demonstrated severe degenerative changes with full-thickness loss of articular cartilage. Osteophytes were debrided using a rongeur. Anterior and posterior cruciate ligaments were excised. Two 4.0 mm Schanz pins were inserted in the femur and into the tibia for attachment of the array of trackers used for computer-assisted navigation. Hip center was identified using a circumduction technique. Distal landmarks were mapped using the computer. The distal femur and proximal tibia were mapped using the computer. The distal femoral cutting guide was positioned using computer-assisted navigation so as to achieve a 5 distal valgus cut. The femur was sized and it was felt that a size 6 femoral component was appropriate. A size 6 femoral cutting guide was positioned and the anterior cut was performed and verified using the computer. This was followed by completion of the posterior and chamfer cuts. Femoral cutting guide for the central box was then positioned in the center box cut was performed.  Attention was then directed to the proximal tibia. Medial and lateral menisci were excised. The extramedullary tibial cutting guide was positioned using computer-assisted navigation so as to achieve a 0 varus-valgus alignment and 3 posterior slope. The cut was  performed and verified using the computer. The proximal tibia  was sized and it was felt that a size 5 tibial tray was appropriate. Tibial and femoral trials were inserted followed by insertion of a 10 mm polyethylene insert. This allowed for excellent mediolateral soft tissue balancing both in flexion and in full extension. Finally, the patella was cut and prepared so as to accommodate a 35 mm medialized dome patella. A patella trial was placed and the knee was placed through a range of motion with excellent patellar tracking appreciated. The femoral trial was removed after debridement of posterior osteophytes. The central post-hole for the tibial component was reamed followed by insertion of a keel punch. Tibial trials were then removed. Cut surfaces of bone were irrigated with copious amounts of normal saline with antibiotic solution using pulsatile lavage and then suctioned dry. Polymethylmethacrylate cement was prepared in the usual fashion using a vacuum mixer. Cement was applied to the cut surface of the proximal tibia as well as along the undersurface of a size 5 rotating platform tibial component. Tibial component was positioned and impacted into place. Excess cement was removed using [employer] fast streamer. Cement was then applied to the cut surfaces of the femur as well as along the posterior flanges of the size 6 femoral component. The femoral component was positioned and impacted into place. Excess cement was removed using [employer] fast streamer. A 10 mm polyethylene trial was inserted and the knee was brought into full extension with steady axial compression applied. Finally, cement was applied to the backside of a 35 mm medialized dome patella and the patellar component was positioned and patellar clamp applied. Excess cement was removed using [employer] fast streamer. After adequate curing of the cement, the tourniquet was deflated after a total tourniquet time of 73 minutes. Hemostasis was achieved using electrocautery.  The knee was irrigated with copious amounts of normal saline with antibiotic solution using pulsatile lavage and then suctioned dry. 20 mL of 1.3% Exparel and 60 mL of 0.25% Marcaine in 40 mL of normal saline was injected along the posterior capsule, medial and lateral gutters, and along the arthrotomy site. A 10 mm stabilized rotating platform polyethylene insert was inserted and the knee was placed through a range of motion with excellent mediolateral soft tissue balancing appreciated and excellent patellar tracking noted. 2 medium drains were placed in the wound bed and brought out through separate stab incisions. The medial parapatellar portion of the incision was reapproximated using interrupted sutures of #1 Vicryl. Subcutaneous tissue was approximated in layers using first #0 Vicryl followed #2-0 Vicryl. The skin was approximated with skin staples. A sterile dressing was applied.  The patient tolerated the procedure well and was transported to the recovery room in stable condition.    Chloey Ricard P. Holley Bouche., M.D.

## 2017-04-23 NOTE — Evaluation (Signed)
Physical Therapy Evaluation Patient Details Name: [name] [name] MRN: [id number] DOB: Dec 25, 1940 Today's Date: 04/23/2017   History of Present Illness  admitted status post L TKR (04/23/17), WBAT.  Clinical Impression  Upon evaluation, patient alert and oriented; follows all commands and demonstrates good insight, good effort with all therapeutic tasks.  L LE strength and ROM (6-65 degrees) limited by pain, but demonstrates fair L quad contraction with cuing/facilitation from therapist; good ability to control knee in closed-chain activities.  Able to complete supine/sit with min assist; sit/stand, basic transfers and gait (5') with RW, min assist.  Decreased stance time L LE, but no overt buckling or LOB.  Decreased quality and fluidity of movement; anticipate improvement as pain controlled and patient more confident with movement patterns post-op. Would benefit from skilled PT to address above deficits and promote optimal return to PLOF; recommend transition to STR upon discharge from acute hospitalization.     Follow Up Recommendations SNF    Equipment Recommendations  Rolling walker with 5" wheels    Recommendations for Other Services       Precautions / Restrictions Precautions Precautions: Fall Restrictions Weight Bearing Restrictions: Yes LLE Weight Bearing: Weight bearing as tolerated      Mobility  Bed Mobility Overal bed mobility: Needs Assistance Bed Mobility: Supine to Sit     Supine to sit: Min assist        Transfers Overall transfer level: Needs assistance Equipment used: Rolling walker (2 wheeled) Transfers: Sit to/from Stand Sit to Stand: Min assist         General transfer comment: requires UE support to assist with movement transition; difficulty with bilat knee flexion (L > R) with movement transition  Ambulation/Gait Ambulation/Gait assistance: Min assist Ambulation Distance (Feet): 5 Feet Assistive device: Rolling walker (2 wheeled)        General Gait Details: step to gait pattern with decreased stance time/weight acceptance L LE; min assist for walker position/management.  Stairs            Wheelchair Mobility    Modified Rankin (Stroke Patients Only)       Balance Overall balance assessment: Needs assistance Sitting-balance support: No upper extremity supported;Feet supported Sitting balance-Leahy Scale: Good     Standing balance support: Bilateral upper extremity supported Standing balance-Leahy Scale: Fair                               Pertinent Vitals/Pain Pain Assessment: 0-10 Pain Score: 6  Pain Location: R knee Pain Descriptors / Indicators: Aching;Grimacing;Guarding Pain Intervention(s): Limited activity within patient's tolerance;Monitored during session;Repositioned    Home Living Family/patient expects to be discharged to:: Assisted living Living Arrangements: Alone             Home Equipment: Cane - single point (3WRW)      Prior Function Level of Independence: Independent with assistive device(s)         Comments: Ambulatory with SPC for most mobility (since difficulty with L knee), 3WRW when walking dog.  Mod indep for ADLs and household mobility; ambulates to/from dining hall at least 1x/day for meals     Hand Dominance        Extremity/Trunk Assessment   Upper Extremity Assessment Upper Extremity Assessment: Overall WFL for tasks assessed    Lower Extremity Assessment Lower Extremity Assessment:  (L knee generally 3-/5 throughout, limited by pain; otherwise, grossly [facility])       Communication  Communication: No difficulties  Cognition Arousal/Alertness: Awake/alert Behavior During Therapy: WFL for tasks assessed/performed Overall Cognitive Status: Within Functional Limits for tasks assessed                                        General Comments      Exercises Total Joint Exercises Goniometric ROM: 6-65 degrees, act  assist Other Exercises Other Exercises: Supine LE therex, 1x10, AROM: ankle pumps, quad sets, SAQs, heel slides, hip abduct/adduct and SLR.  Min facilitation for optimal L quad contraction with isolated therex   Assessment/Plan    PT Assessment Patient needs continued PT services  PT Problem List Decreased strength;Decreased range of motion;Decreased activity tolerance;Decreased balance;Decreased mobility;Decreased coordination;Decreased cognition;Decreased knowledge of precautions;Pain       PT Treatment Interventions Gait training;DME instruction;Functional mobility training;Therapeutic activities;Therapeutic exercise;Balance training;Patient/family education    PT Goals (Current goals can be found in the Care Plan section)  Acute Rehab PT Goals Patient Stated Goal: to get going again PT Goal Formulation: With patient Time For Goal Achievement: 05/07/17 Potential to Achieve Goals: Good    Frequency BID   Barriers to discharge Decreased caregiver support      Co-evaluation               AM-PAC PT "6 Clicks" Daily Activity  Outcome Measure Difficulty turning over in bed (including adjusting bedclothes, sheets and blankets)?: Total Difficulty moving from lying on back to sitting on the side of the bed? : Total Difficulty sitting down on and standing up from a chair with arms (e.g., wheelchair, bedside commode, etc,.)?: Total Help needed moving to and from a bed to chair (including a wheelchair)?: A Little Help needed walking in hospital room?: A Little Help needed climbing 3-5 steps with a railing? : A Lot 6 Click Score: 11    End of Session Equipment Utilized During Treatment: Gait belt Activity Tolerance: Patient tolerated treatment well Patient left: in chair;with call bell/phone within reach;with chair alarm set Nurse Communication: Mobility status PT Visit Diagnosis: Difficulty in walking, not elsewhere classified (R26.2);Muscle weakness (generalized)  (M62.81);Pain Pain - Right/Left: Left Pain - part of body: Knee    Time: 5364-6803 PT Time Calculation (min) (ACUTE ONLY): 33 min   Charges:   PT Evaluation $PT Eval Low Complexity: 1 Procedure PT Treatments $Therapeutic Exercise: 8-22 mins   PT G Codes:        Yale Golla H. Owens Shark, PT, DPT, NCS 04/23/17, 5:20 PM [phone]

## 2017-04-23 NOTE — NC FL2 (Signed)
[place] LEVEL OF CARE SCREENING TOOL     IDENTIFICATION  Patient Name: [name] [name] Birthdate: [birth year] Sex: female Admission Date (Current Location): 04/23/2017  [place] and [place] Number:  Engineering geologist and Address:  [facility], [address], [place], [place] [address]      Provider Number: [id number]  Attending Physician Name and Address:  Dereck Leep, MD  Relative Name and Phone Number:       Current Level of Care: Hospital Recommended Level of Care: [place] Prior Approval Number:    Date Approved/Denied:   PASRR Number:  ([id number] )  Discharge Plan: SNF    Current Diagnoses: Patient Active Problem List   Diagnosis Date Noted  . Osteoporosis 08/02/2016  . History of colon polyps 08/02/2016  . Low back pain 06/20/2016  . S/P total knee arthroplasty 03/01/2016  . Knee osteoarthritis 02/16/2016  . Weakness 09/27/2015  . 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 History of CVA (cerebrovascular accident) 02/16/2014  . Seizures ([place]) 02/16/2014  . Chronic leg pain 02/16/2014  . Fatigue 07/02/2013  . HYPERTENSION, BENIGN 12/07/2010  . ATRIAL FIBRILLATION 06/29/2009  . SHORTNESS OF BREATH 06/29/2009    Orientation RESPIRATION BLADDER Height & Weight     Self, Time, Situation, Place  O2 (2 Liters Oxygen ) Continent Weight: 163 lb (73.9 kg) Height:  5\' 1"  (154.9 cm)  BEHAVIORAL SYMPTOMS/MOOD NEUROLOGICAL BOWEL NUTRITION STATUS   (none )  (none) Continent Diet (Diet: Clear Liquid to be Advanced. )  AMBULATORY STATUS COMMUNICATION OF NEEDS Skin   Extensive Assist Verbally Surgical wounds (Incision: Left Knee )                       Personal Care Assistance Level of Assistance  Bathing, Feeding, Dressing Bathing Assistance: Limited assistance Feeding assistance: Independent Dressing Assistance: Limited assistance     Functional Limitations Info  Sight,  Hearing, Speech Sight Info: Adequate Hearing Info: Adequate Speech Info: Adequate    SPECIAL CARE FACTORS FREQUENCY  PT (By licensed PT), OT (By licensed OT)     PT Frequency:  (5) OT Frequency:  (5)            Contractures      Additional Factors Info  Code Status, Allergies Code Status Info:  (Full Code ) Allergies Info:  (Aspirin)           Current Medications (04/23/2017):  This is the current hospital active medication list Current Facility-Administered Medications  Medication Dose Route Frequency Provider Last Rate Last Dose  . 0.9 %  sodium chloride infusion   Intravenous Continuous Hooten, Laurice Record, MD 100 mL/hr at 04/23/17 1215    . acetaminophen (OFIRMEV) IV 1,000 mg  1,000 mg Intravenous Q6H Hooten, Laurice Record, MD 400 mL/hr at 04/23/17 1341 1,000 mg at 04/23/17 1341  . acetaminophen (TYLENOL) tablet 650 mg  650 mg Oral Q6H PRN Hooten, Laurice Record, MD       Or  . acetaminophen (TYLENOL) suppository 650 mg  650 mg Rectal Q6H PRN Hooten, Laurice Record, MD      . acidophilus (RISAQUAD) capsule 1 capsule  1 capsule Oral QPM Hooten, Laurice Record, MD      . alum & mag hydroxide-simeth (MAALOX/MYLANTA) 200-200-20 MG/5ML suspension 30 mL  30 mL Oral Q4H PRN Hooten, Laurice Record, MD      . bisacodyl (DULCOLAX) suppository 10 mg  10 mg  Rectal Daily PRN Hooten, Laurice Record, MD      . calcium-vitamin D (OSCAL WITH D) 500-200 MG-UNIT per tablet 1 tablet  1 tablet Oral BID Hooten, Laurice Record, MD      . ceFAZolin (ANCEF) IVPB 2g/100 mL premix  2 g Intravenous Q6H Hooten, Laurice Record, MD      . cholecalciferol (VITAMIN D) tablet 1,000 Units  1,000 Units Oral Daily Hooten, Laurice Record, MD      . diltiazem (CARDIZEM CD) 24 hr capsule 240 mg  240 mg Oral Daily Hooten, Laurice Record, MD      . diphenhydrAMINE (BENADRYL) 12.5 MG/5ML elixir 12.5-25 mg  12.5-25 mg Oral Q4H PRN Hooten, Laurice Record, MD      . famotidine (PEPCID) 20 MG tablet           . ferrous sulfate tablet 325 mg  325 mg Oral BID WC Hooten, Laurice Record, MD      .  gabapentin (NEURONTIN) capsule 200 mg  200 mg Oral BID Hooten, Laurice Record, MD      . HYDROmorphone (DILAUDID) 1 MG/ML injection           . magnesium hydroxide (MILK OF MAGNESIA) suspension 30 mL  30 mL Oral Daily PRN Hooten, Laurice Record, MD      . menthol-cetylpyridinium (CEPACOL) lozenge 3 mg  1 lozenge Oral PRN Hooten, Laurice Record, MD       Or  . phenol (CHLORASEPTIC) mouth spray 1 spray  1 spray Mouth/Throat PRN Hooten, Laurice Record, MD      . metoCLOPramide (REGLAN) tablet 10 mg  10 mg Oral TID AC & HS Hooten, Laurice Record, MD      . metoprolol tartrate (LOPRESSOR) tablet 25 mg  25 mg Oral Daily Hooten, Laurice Record, MD      . morphine 2 MG/ML injection 2 mg  2 mg Intravenous Q2H PRN Hooten, Laurice Record, MD      . ondansetron (ZOFRAN) tablet 4 mg  4 mg Oral Q6H PRN Hooten, Laurice Record, MD       Or  . ondansetron (ZOFRAN) injection 4 mg  4 mg Intravenous Q6H PRN Hooten, Laurice Record, MD      . oxybutynin (DITROPAN-XL) 24 hr tablet 5 mg  5 mg Oral QHS Hooten, Laurice Record, MD      . oxyCODONE (Oxy IR/ROXICODONE) immediate release tablet 5-10 mg  5-10 mg Oral Q4H PRN Hooten, Laurice Record, MD      . pantoprazole (PROTONIX) EC tablet 40 mg  40 mg Oral BID Hooten, Laurice Record, MD      . polyvinyl alcohol (LIQUIFILM TEARS) 1.4 % ophthalmic solution 1 drop  1 drop Both Eyes BID PRN Hooten, Laurice Record, MD      . Derrill Memo ON 04/24/2017] rivaroxaban (XARELTO) tablet 20 mg  20 mg Oral Daily Hooten, Laurice Record, MD      . senna-docusate (Senokot-S) tablet 1 tablet  1 tablet Oral BID Hooten, Laurice Record, MD      . sodium phosphate (FLEET) 7-19 GM/118ML enema 1 enema  1 enema Rectal Once PRN Hooten, Laurice Record, MD      . traMADol Veatrice Bourbon) tablet 50-100 mg  50-100 mg Oral Q4H PRN Hooten, Laurice Record, MD         Discharge Medications: Please see discharge summary for a list of discharge medications.  Relevant Imaging Results:  Relevant Lab Results:   Additional Information  (SSN: [id number])  Sample, Veronia Beets, LCSW

## 2017-04-23 NOTE — Anesthesia Procedure Notes (Signed)
Procedure Name: Intubation Date/Time: 04/23/2017 7:58 AM Performed by: Rosaria Ferries, Gethsemane Fischler Pre-anesthesia Checklist: Patient identified, Emergency Drugs available, Suction available and Patient being monitored Patient Re-evaluated:Patient Re-evaluated prior to inductionOxygen Delivery Method: Circle system utilized Preoxygenation: Pre-oxygenation with 100% oxygen Intubation Type: IV induction Laryngoscope Size: Mac and 3 Grade View: Grade I Tube size: 7.0 mm Number of attempts: 1 Placement Confirmation: ETT inserted through vocal cords under direct vision,  positive ETCO2 and breath sounds checked- equal and bilateral Secured at: 21 cm Tube secured with: Tape Dental Injury: Teeth and Oropharynx as per pre-operative assessment

## 2017-04-23 NOTE — Anesthesia Postprocedure Evaluation (Signed)
Anesthesia Post Note  Patient: [name] [name]  Procedure(s) Performed: Procedure(s) (LRB): COMPUTER ASSISTED TOTAL KNEE ARTHROPLASTY (Left)  Patient location during evaluation: PACU Anesthesia Type: General Level of consciousness: awake and alert and oriented Pain management: pain level controlled Vital Signs Assessment: post-procedure vital signs reviewed and stable Respiratory status: spontaneous breathing Cardiovascular status: blood pressure returned to baseline Anesthetic complications: no     Last Vitals:  Vitals:   04/23/17 1208 04/23/17 1242  BP: 140/62 129/68  Pulse: 70 92  Resp: 16 16  Temp: 36.6 C     Last Pain:  Vitals:   04/23/17 1208  TempSrc: Oral  PainSc:                  Shalaina Guardiola

## 2017-04-23 NOTE — Progress Notes (Signed)
CH. responded to Pt. desire for a AD. CH. explained the procedure to both the Pt. and her sister.

## 2017-04-23 NOTE — Anesthesia Post-op Follow-up Note (Cosign Needed)
Anesthesia QCDR form completed.        

## 2017-04-23 NOTE — Transfer of Care (Signed)
Immediate Anesthesia Transfer of Care Note  Patient: [name] [name]  Procedure(s) Performed: Procedure(s): COMPUTER ASSISTED TOTAL KNEE ARTHROPLASTY (Left)  Patient Location: PACU  Anesthesia Type:General  Level of Consciousness: awake, alert , oriented and patient cooperative  Airway & Oxygen Therapy: Patient Spontanous Breathing and Patient connected to nasal cannula oxygen  Post-op Assessment: Report given to RN and Post -op Vital signs reviewed and stable  Post vital signs: Reviewed and stable  Last Vitals:  Vitals:   04/23/17 0604  BP: 129/75  Pulse: 88  Resp: 16  Temp: 36.6 C    Last Pain:  Vitals:   04/23/17 0604  TempSrc: Tympanic  PainSc: 8          Complications: No apparent anesthesia complications

## 2017-04-23 NOTE — H&P (Signed)
The patient has been re-examined, and the chart reviewed, and there have been no interval changes to the documented history and physical.    The risks, benefits, and alternatives have been discussed at length. The patient expressed understanding of the risks benefits and agreed with plans for surgical intervention.  Lilton Pare P. Sadeel Fiddler, Jr. M.D.    
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
## 2017-04-24 ENCOUNTER — Encounter: Payer: Self-pay | Admitting: Orthopedic Surgery

## 2017-04-24 ENCOUNTER — Encounter
Admission: RE | Admit: 2017-04-24 | Discharge: 2017-04-24 | Disposition: A | Discharge status: Home / Self Care | Payer: PPO | Source: Ambulatory Visit | Attending: Internal Medicine | Admitting: Internal Medicine

## 2017-04-24 LAB — CBC
HCT: 34.6 % — ABNORMAL LOW (ref 35.0–47.0)
Hemoglobin: 11.9 g/dL — ABNORMAL LOW (ref 12.0–16.0)
MCH: 32.2 pg (ref 26.0–34.0)
MCHC: 34.3 g/dL (ref 32.0–36.0)
MCV: 94 fL (ref 80.0–100.0)
Platelets: 151 10*3/uL (ref 150–440)
RBC: 3.68 MIL/uL — ABNORMAL LOW (ref 3.80–5.20)
RDW: 13.4 % (ref 11.5–14.5)
WBC: 12.1 10*3/uL — ABNORMAL HIGH (ref 3.6–11.0)

## 2017-04-24 LAB — BASIC METABOLIC PANEL
Anion gap: 5 (ref 5–15)
BUN: 15 mg/dL (ref 6–20)
CO2: 25 mmol/L (ref 22–32)
Calcium: 8.8 mg/dL — ABNORMAL LOW (ref 8.9–10.3)
Chloride: 108 mmol/L (ref 101–111)
Creatinine, Ser: 0.64 mg/dL (ref 0.44–1.00)
GFR calc Af Amer: >60 mL/min (ref >60)
GFR calc non Af Amer: >60 mL/min (ref >60)
Glucose, Bld: 131 mg/dL — ABNORMAL HIGH (ref 65–99)
Potassium: 4.2 mmol/L (ref 3.5–5.1)
Sodium: 138 mmol/L (ref 135–145)

## 2017-04-24 MED ORDER — OXYCODONE HCL 5 MG PO TABS: 5.0000 mg | ORAL_TABLET | Freq: Four times a day (QID) | ORAL | 0 refills | Status: DC | PRN

## 2017-04-24 MED ORDER — TRAMADOL HCL 50 MG PO TABS: 50.0000 mg | ORAL_TABLET | ORAL | 0 refills | Status: DC | PRN

## 2017-04-24 NOTE — Progress Notes (Signed)
Physical Therapy Treatment Patient Details Name: [name] [name] MRN: [id number] DOB: 1941-02-15 Today's Date: 04/24/2017    History of Present Illness admitted status post L TKR (04/23/17), WBAT.    PT Comments    Pt agreeable to PT; reports mild increase in pain to a 5 from a 3 by session's end. Pt requesting medication. Pt able to demonstrate slightly further ambulation distance; however, continues to note feeling of unsteadiness without overt loss of balance. Pt participates in stretching and strengthening exercises this session. Pt fatigued with return to bed. Continue PT to progress strength, range, endurance to improve all functional mobility.    Follow Up Recommendations  SNF     Equipment Recommendations       Recommendations for Other Services       Precautions / Restrictions Precautions Precautions: Fall Restrictions Weight Bearing Restrictions: Yes LLE Weight Bearing: Weight bearing as tolerated    Mobility  Bed Mobility Overal bed mobility: Needs Assistance Bed Mobility: Sit to Supine     Supine to sit: Min assist     General bed mobility comments: Assist for LEs  Transfers Overall transfer level: Needs assistance Equipment used: Rolling walker (2 wheeled) Transfers: Sit to/from Stand Sit to Stand: Min assist         General transfer comment: slow to rise; hand placement cues.   Ambulation/Gait Ambulation/Gait assistance: Min assist Ambulation Distance (Feet): 10 Feet Assistive device: Rolling walker (2 wheeled) Gait Pattern/deviations: Step-to pattern Gait velocity: reduced   General Gait Details: Small steps, mild unsteadiness. focus on QS L for increased feeling of stability   Stairs            Wheelchair Mobility    Modified Rankin (Stroke Patients Only)       Balance Overall balance assessment: Needs assistance Sitting-balance support: No upper extremity supported;Feet supported;Bilateral upper extremity  supported Sitting balance-Leahy Scale: Good     Standing balance support: Bilateral upper extremity supported Standing balance-Leahy Scale: Fair                              Cognition Arousal/Alertness: Awake/alert Behavior During Therapy: WFL for tasks assessed/performed Overall Cognitive Status: Within Functional Limits for tasks assessed                                        Exercises Total Joint Exercises Quad Sets: Strengthening;Both;20 reps;Supine (also 10x in stand) Knee Flexion: AROM;Left;10 reps;Seated (3 positions each reps with 10 sec hold each) Goniometric ROM: 4-70    General Comments        Pertinent Vitals/Pain Pain Assessment: 0-10 Pain Score: 3  Pain Location: L knee Pain Descriptors / Indicators: Aching;Grimacing;Guarding Pain Intervention(s): Limited activity within patient's tolerance;Monitored during session;Ice applied    Home Living Family/patient expects to be discharged to:: Other (Comment) ([place]) Living Arrangements: Alone;Other (Comment) (Pt was living at [facility], [place] next to [address])           Home Equipment: Kasandra Knudsen - single point;Shower seat - built in;Grab bars - tub/shower;Hand held shower head;Adaptive equipment      Prior Function Level of Independence: Independent with assistive device(s)      Comments: Ambulatory with SPC for most mobility (since difficulty with L knee), 3WRW when walking dog.  Mod indep for ADLs and household  mobility; ambulates to/from dining hall at least 1x/day for meals   PT Goals (current goals can now be found in the care plan section) Acute Rehab PT Goals Patient Stated Goal: "to get my independence back" Progress towards PT goals: Progressing toward goals    Frequency    BID      PT Plan Current plan remains appropriate    Co-evaluation              AM-PAC PT "6 Clicks" Daily Activity  Outcome Measure   Difficulty turning over in bed (including adjusting bedclothes, sheets and blankets)?: Total Difficulty moving from lying on back to sitting on the side of the bed? : Total Difficulty sitting down on and standing up from a chair with arms (e.g., wheelchair, bedside commode, etc,.)?: Total Help needed moving to and from a bed to chair (including a wheelchair)?: A Little Help needed walking in hospital room?: A Little Help needed climbing 3-5 steps with a railing? : A Lot 6 Click Score: 11    End of Session Equipment Utilized During Treatment: Gait belt Activity Tolerance: Patient tolerated treatment well Patient left: with call bell/phone within reach;with bed alarm set;in bed (polar care in place) Nurse Communication: Other (comment) (with nsg asst on proper removal of equipment for mobility ) PT Visit Diagnosis: Difficulty in walking, not elsewhere classified (R26.2);Muscle weakness (generalized) (M62.81);Pain Pain - Right/Left: Left Pain - part of body: Knee     Time: 5038-8828 PT Time Calculation (min) (ACUTE ONLY): 29 min  Charges:  $Gait Training: 8-22 mins $Therapeutic Exercise: 8-22 mins                    G Codes:        Larae Grooms, PTA 04/24/2017, 3:50 PM

## 2017-04-24 NOTE — Discharge Instructions (Signed)

## 2017-04-24 NOTE — Progress Notes (Addendum)
Physical Therapy Treatment Patient Details Name: BRANDELYN HENNE MRN: [id number] DOB: 03-Aug-1941 Today's Date: 04/24/2017    History of Present Illness admitted status post L TKR (04/23/17), WBAT.    PT Comments    Pt agreeable to PT; reports pain increasing this morning from earlier to 5/10. Pt continues to require Min A for all mobility. Increased education regarding strengthening/stretching exercises for technique, frequency, duration, repetitions and as it pertains to function for improved understanding of importance, as pt questions need. Pt encouraged to continue exercises throughout the day. Plan to continue Pt this afternoon for continued work on range, endurance, strength to improve all function.    Follow Up Recommendations   SNF     Equipment Recommendations       Recommendations for Other Services       Precautions / Restrictions Precautions Precautions: Fall Restrictions Weight Bearing Restrictions: Yes LLE Weight Bearing: Weight bearing as tolerated    Mobility  Bed Mobility Overal bed mobility: Needs Assistance Bed Mobility: Supine to Sit     Supine to sit: Min assist     General bed mobility comments: Assist for LEs; attempted hooklying, but too painful/difficult. Needs physical assist  Transfers Overall transfer level: Needs assistance Equipment used: Rolling walker (2 wheeled) Transfers: Sit to/from Stand Sit to Stand: Min assist         General transfer comment: Slow to rise, cues for straightening knees/hips and QS. Unsteady initially requiring increased support to attain balance.   Ambulation/Gait Ambulation/Gait assistance: Min assist Ambulation Distance (Feet): 5 Feet Assistive device: Rolling walker (2 wheeled) Gait Pattern/deviations: Step-to pattern Gait velocity: reduced Gait velocity interpretation: <1.8 ft/sec, indicative of risk for recurrent falls General Gait Details: Small steps, mild unsteadiness. focus on QS L for increased  feeling of stability   Stairs            Wheelchair Mobility    Modified Rankin (Stroke Patients Only)       Balance Overall balance assessment: Needs assistance Sitting-balance support: No upper extremity supported;Feet supported;Bilateral upper extremity supported Sitting balance-Leahy Scale: Good     Standing balance support: Bilateral upper extremity supported Standing balance-Leahy Scale: Fair                              Cognition Arousal/Alertness: Awake/alert Behavior During Therapy: WFL for tasks assessed/performed Overall Cognitive Status: Within Functional Limits for tasks assessed                                        Exercises Total Joint Exercises Quad Sets: Strengthening;Both;20 reps;Supine (also 10x in stand) Knee Flexion: AROM;Left;10 reps;Seated (3 positions each reps with 10 sec hold each) Goniometric ROM: 7-70    General Comments        Pertinent Vitals/Pain Pain Assessment: 0-10 Pain Score: 5  Pain Location: L knee Pain Intervention(s): Monitored during session;Premedicated before session;Repositioned;Ice applied (Tylenol this a.m)    Home Living                      Prior Function            PT Goals (current goals can now be found in the care plan section) Progress towards PT goals: Progressing toward goals    Frequency    BID      PT Plan Current plan remains  appropriate    Co-evaluation              AM-PAC PT "6 Clicks" Daily Activity  Outcome Measure  Difficulty turning over in bed (including adjusting bedclothes, sheets and blankets)?: Total Difficulty moving from lying on back to sitting on the side of the bed? : Total Difficulty sitting down on and standing up from a chair with arms (e.g., wheelchair, bedside commode, etc,.)?: Total Help needed moving to and from a bed to chair (including a wheelchair)?: A Little Help needed walking in hospital room?: A Little Help  needed climbing 3-5 steps with a railing? : A Lot 6 Click Score: 11    End of Session Equipment Utilized During Treatment: Gait belt Activity Tolerance: Patient tolerated treatment well Patient left: in chair;with call bell/phone within reach;with chair alarm set (polar care in place) Nurse Communication: Other (comment) (with nsg asst on proper removal of equipment for mobility ) PT Visit Diagnosis: Difficulty in walking, not elsewhere classified (R26.2);Muscle weakness (generalized) (M62.81);Pain Pain - Right/Left: Left Pain - part of body: Knee     Time: 4827-0786 PT Time Calculation (min) (ACUTE ONLY): 51 min  Charges:  $Gait Training: 8-22 mins $Therapeutic Exercise: 8-22 mins $Therapeutic Activity: 8-22 mins                    G Codes:        Larae Grooms, PTA 04/24/2017, 10:51 AM

## 2017-04-24 NOTE — Progress Notes (Signed)
Patient resting in chair. Dressing in place dry and intact on left knee. Tolerating PO's well. No complaints at this time. Continue to monitor.

## 2017-04-24 NOTE — Progress Notes (Signed)
Plan is for patient to D/C to [facility] tomorrow, room 201-B if medically stable. RN will call report at [phone]. [facility] authorization has been received, auth # [id number]. Clinical Education officer, museum (CSW) sent D/C summary to [place] today via [place]. Patient is aware of above. [facility] admissions coordinator at [facility] is aware of above and stated that patient can come to [facility] tomorrow. CSW will continue to follow and assist as needed.   [employer], LCSW [phone]

## 2017-04-24 NOTE — Progress Notes (Signed)
ORTHOPAEDICS PROGRESS NOTE  PATIENT NAME: [name] [name] DOB: [birth year]  MRN: [id number]  POD # 1: Left total knee arthroplasty  Subjective: The patient states that the pain is been well-controlled. She denies any nausea. The patient tolerated physical therapy well on the afternoon of surgery.  Objective: Vital signs in last 24 hours: Temp:  [97.6 F (36.4 C)-98.8 F (37.1 C)] 98.3 F (36.8 C) (07/03 0714) Pulse Rate:  [59-117] 77 (07/03 0714) Resp:  [9-18] 18 (07/03 0714) BP: (113-174)/(53-83) 113/53 (07/03 0714) SpO2:  [92 %-100 %] 93 % (07/03 0714)  Intake/Output from previous day: 07/02 0701 - 07/03 0700 In: 4343.3 [I.V.:3533.3; IV Piggyback:810] Out: 2876 [Urine:3360; Blood:100]   Recent Labs  04/23/17 0720 04/24/17 0503  WBC  --  12.1*  HGB  --  11.9*  HCT  --  34.6*  PLT  --  151  K  --  4.2  CL  --  108  CO2  --  25  BUN  --  15  CREATININE  --  0.64  GLUCOSE  --  131*  CALCIUM  --  8.8*  INR 1.98  --     EXAM General: Well-developed well-nourished female seen in no acute distress Lungs: clear to auscultation Cardiac: Irregular rate and rhythm. No significant murmurs, gallops, or rubs. Left lower extremity: Dressing is dry and intact. Hemovac drains are in place. Bone foam is in place. The patient can perform a straight leg raise with assistance. Homans test is negative. Neurologic: Awake, alert, and oriented. Sensory and motor function are grossly intact.  Assessment: Status post left total knee arthroplasty  Secondary diagnoses: CVA Seizure Hypertension Atrial fibrillation History of DVT  Plan: Today's goal were reviewed with the patient.  Consider Hematology consult for elevated PT/INR. Continue with physical therapy and occupational therapy as per total knee arthroplasty rehabilitation protocol. Plan is to go Skilled nursing facility after hospital stay. DVT Prophylaxis - Xarelto, Foot Pumps and TED hose  James P. Holley Bouche M.D.

## 2017-04-24 NOTE — Clinical Social Work Placement (Signed)
   CLINICAL SOCIAL WORK PLACEMENT  NOTE  Date:  04/24/2017  Patient Details  Name: [name] [name] MRN: [id number] Date of Birth: 1941-08-07  Clinical Social Work is seeking post-discharge placement for this patient at the [place] level of care (*CSW will initial, date and re-position this form in  chart as items are completed):  Yes   Patient/family provided with [place] Work Department's list of facilities offering this level of care within the geographic area requested by the patient (or if unable, by the patient's family).  Yes   Patient/family informed of their freedom to choose among providers that offer the needed level of care, that participate in Medicare, Medicaid or managed care program needed by the patient, have an available bed and are willing to accept the patient.  Yes   Patient/family informed of [facility]'s ownership interest in [facility] and [facility], as well as of the fact that they are under no obligation to receive care at these facilities.  PASRR submitted to EDS on       PASRR number received on       Existing PASRR number confirmed on 04/24/17     FL2 transmitted to all facilities in geographic area requested by pt/family on 04/24/17     FL2 transmitted to all facilities within larger geographic area on       Patient informed that his/her managed care company has contracts with or will negotiate with certain facilities, including the following:        Yes   Patient/family informed of bed offers received.  Patient chooses bed at  [facility] )     Physician recommends and patient chooses bed at      Patient to be transferred to   on  .  Patient to be transferred to facility by       Patient family notified on   of transfer.  Name of family member notified:        PHYSICIAN       Additional Comment:    _______________________________________________ Oza Oberle, Veronia Beets, LCSW 04/24/2017,  11:56 AM

## 2017-04-24 NOTE — Care Management (Signed)
RNCM consult for discharge planning. PT recommending SNF. Will sign off. CSW following.

## 2017-04-24 NOTE — Evaluation (Signed)
Occupational Therapy Evaluation Patient Details Name: [name] [name] MRN: [id number] DOB: 1941/04/28 Today's Date: 04/24/2017    History of Present Illness admitted status post L TKR (04/23/17), WBAT.   Clinical Impression   Pt is 76 year old female s/p L TKR, WBAT.  Pt was independent in all ADLs prior to surgery living at [facility]. She is eager to return to [facility] and regain independence.  Pt currently requires min assist for LB dressing while in seated position due to pain and limited AROM of L knee.  Pt would benefit from instruction in dressing techniques with or without assistive devices for dressing and bathing skills.  Pt would also benefit from recommendations for home modifications to increase safety in the bathroom and prevent falls. Rec SNF to continue rehab after hospital stay.    Follow Up Recommendations  SNF    Equipment Recommendations       Recommendations for Other Services       Precautions / Restrictions Precautions Precautions: Fall Restrictions Weight Bearing Restrictions: Yes LLE Weight Bearing: Weight bearing as tolerated      Mobility Bed Mobility Overal bed mobility: Needs Assistance Bed Mobility: Supine to Sit     Supine to sit: Min assist     General bed mobility comments: Assist for LEs; attempted hooklying, but too painful/difficult. Needs physical assist  Transfers Overall transfer level: Needs assistance Equipment used: Rolling walker (2 wheeled) Transfers: Sit to/from Stand Sit to Stand: Min assist         General transfer comment: Slow to rise, cues for straightening knees/hips and QS. Unsteady initially requiring increased support to attain balance.     Balance Overall balance assessment: Needs assistance Sitting-balance support: No upper extremity supported;Feet supported;Bilateral upper extremity supported Sitting balance-Leahy Scale: Good     Standing balance support: Bilateral upper extremity supported Standing  balance-Leahy Scale: Fair                             ADL either performed or assessed with clinical judgement   ADL Overall ADL's : Needs assistance/impaired Eating/Feeding: Independent;Set up   Grooming: Wash/dry hands;Wash/dry face;Oral care;Applying deodorant;Brushing hair;Independent;Set up           Upper Body Dressing : Independent;Set up   Lower Body Dressing: Minimal assistance;Set up;Sit to/from stand;With adaptive equipment                       Vision Patient Visual Report: No change from baseline       Perception     Praxis      Pertinent Vitals/Pain Pain Assessment: 0-10 Pain Score: 3  Pain Location: L knee Pain Descriptors / Indicators: Aching;Grimacing;Guarding Pain Intervention(s): Monitored during session;Premedicated before session     Hand Dominance Right   Extremity/Trunk Assessment Upper Extremity Assessment Upper Extremity Assessment: Overall WFL for tasks assessed   Lower Extremity Assessment Lower Extremity Assessment: Defer to PT evaluation       Communication Communication Communication: No difficulties   Cognition Arousal/Alertness: Awake/alert Behavior During Therapy: WFL for tasks assessed/performed Overall Cognitive Status: Within Functional Limits for tasks assessed                                     General Comments       Exercises Exercises: Total Joint Total Joint Exercises Quad Sets: Strengthening;Both;20 reps;Supine (  also 10x in stand) Knee Flexion: AROM;Left;10 reps;Seated (3 positions each reps with 10 sec hold each) Goniometric ROM: 7-70   Shoulder Instructions      Home Living Family/patient expects to be discharged to:: Other (Comment) ([facility]--ILF next to [facility]) Living Arrangements: Alone;Other (Comment) (Pt was living at [facility] [place] next to [facility])                           Home Equipment: Kasandra Knudsen - single point;Shower seat -  built in;Grab bars - tub/shower;Hand held shower head;Adaptive equipment Adaptive Equipment: Reacher        Prior Functioning/Environment Level of Independence: Independent with assistive device(s)        Comments: Ambulatory with SPC for most mobility (since difficulty with L knee), 3WRW when walking dog.  Mod indep for ADLs and household mobility; ambulates to/from dining hall at least 1x/day for meals        OT Problem List: Decreased strength;Decreased range of motion;Decreased activity tolerance;Impaired balance (sitting and/or standing);Pain      OT Treatment/Interventions: Self-care/ADL training;Patient/family education;DME and/or AE instruction;Therapeutic activities    OT Goals(Current goals can be found in the care plan section) Acute Rehab OT Goals Patient Stated Goal: "to get my independence back" OT Goal Formulation: With patient Time For Goal Achievement: 05/08/17 Potential to Achieve Goals: Good ADL Goals Pt Will Perform Lower Body Dressing: with supervision;with adaptive equipment;sit to/from stand (using FWW and no LOB) Pt Will Transfer to Toilet: with min assist;stand pivot transfer;bedside commode;regular height toilet (BSC over toilet to simulate home set up at ILF)  OT Frequency: Min 1X/week   Barriers to D/C:    lives alone in [place] at [place] PT "6 Clicks" Daily Activity     Outcome Measure Help from another person eating meals?: None Help from another person taking care of personal grooming?: None Help from another person toileting, which includes using toliet, bedpan, or urinal?: A Little Help from another person bathing (including washing, rinsing, drying)?: A Little Help from another person to put on and taking off regular upper body clothing?: None Help from another person to put on and taking off regular lower body clothing?: A Little 6 Click Score: 21   End of Session    Activity Tolerance:  Patient tolerated treatment well Patient left: in chair;with call bell/phone within reach;with chair alarm set  OT Visit Diagnosis: Pain;Muscle weakness (generalized) (M62.81) Pain - Right/Left: Left Pain - part of body: Hip                Time: 1045-1130 OT Time Calculation (min): 45 min Charges:  OT General Charges $OT Visit: 1 Procedure OT Treatments $Self Care/Home Management : 38-52 mins G-Codes:     Chrys Racer, OTR/L ascom [phone] 04/24/17, 2:12 PM

## 2017-04-24 NOTE — Discharge Summary (Addendum)
Physician Discharge Summary  Patient ID: [name] [name] MRN: [id number] DOB/AGE: September 01, 1941 76 y.o.  Admit date: 04/23/2017 Discharge date: 04/25/2017  Admission Diagnoses:  PRIMARY OSTEOARTHRITIS OF LEFT KNEE    Discharge Diagnoses: Patient Active Problem List   Diagnosis Date Noted  . Osteoporosis 08/02/2016  . History of colon polyps 08/02/2016  . Low back pain 06/20/2016  . S/P total knee arthroplasty 03/01/2016  . Knee osteoarthritis 02/16/2016  . Weakness 09/27/2015  . 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 History of CVA (cerebrovascular accident) 02/16/2014  . Seizures ([place]) 02/16/2014  . Chronic leg pain 02/16/2014  . Fatigue 07/02/2013  . HYPERTENSION, BENIGN 12/07/2010  . ATRIAL FIBRILLATION 06/29/2009  . SHORTNESS OF BREATH 06/29/2009    Past Medical History:  Diagnosis Date  . Arthritis    osteo; in B knees;   . Bladder disorder   . Chronic a-fib ([place])   . DVT (deep venous thrombosis) ([place])    H/O  . Dyspnea   . Dysrhythmia    a fib  . Edema   . Heart murmur   . Hypertension    controlled  . Seizure ([place])    2 years ago knocked unconscious; hospitalized and diagnosed with seizures; no spells in last year;   . Stroke [facility])    1 month ago (possible stroke)     Transfusion: No transfusions given doing this admission   Consultants (if any):   Discharged Condition: Improved  Hospital Course: [name] [name] is an 76 y.o. female who was admitted 04/23/2017 with a diagnosis of degenerative arthrosis left knee and went to the operating room on 04/23/2017 and underwent the above named procedures.    Surgeries:Procedure(s): COMPUTER ASSISTED TOTAL KNEE ARTHROPLASTY on 04/23/2017  PRE-OPERATIVE DIAGNOSIS: Degenerative arthrosis of the left knee, primary  POST-OPERATIVE DIAGNOSIS:  Same  PROCEDURE:  Left total knee arthroplasty using computer-assisted navigation  SURGEON:  Marciano Sequin. M.D.  ASSISTANT:  Vance Peper, PA (present and scrubbed throughout the case, critical for assistance with exposure, retraction, instrumentation, and closure)  ANESTHESIA: general  ESTIMATED BLOOD LOSS: 100 mL  FLUIDS REPLACED: 1500 mL of crystalloid  TOURNIQUET TIME: 73 minutes  DRAINS: 2 medium Hemovac drains  SOFT TISSUE RELEASES: Anterior cruciate ligament, posterior cruciate ligament, deep medial collateral ligament, patellofemoral ligament  IMPLANTS UTILIZED: DePuy Attune size 6 posterior stabilized femoral component (cemented), size 5 rotating platform tibial component (cemented), 35 mm medialized dome patella (cemented), and a 10 mm stabilized rotating platform polyethylene insert.  INDICATIONS FOR SURGERY: [name] [name] is a 76 y.o. year old female with a long history of progressive knee pain. X-rays demonstrated severe degenerative changes in tricompartmental fashion. The patient had not seen any significant improvement despite conservative nonsurgical intervention. After discussion of the risks and benefits of surgical intervention, the patient expressed understanding of the risks benefits and agree with plans for total knee arthroplasty.   The risks, benefits, and alternatives were discussed at length including but not limited to the risks of infection, bleeding, nerve injury, stiffness, blood clots, the need for revision surgery, cardiopulmonary complications, among others, and they were willing to proceed.  Patient tolerated the surgery well. No complications .Patient was taken to PACU where she was stabilized and then transferred to the orthopedic floor.  Patient started on Xarelto 20 mg daily. Foot pumps applied bilaterally at 80 mm hgb. Heels elevated off bed with rolled towels. No evidence of DVT. Calves non tender. Negative Homan.  Physical therapy started on day #1 for gait training and transfer with OT starting on  day #1 for ADL and assisted devices. Patient has done well with  therapy. Ambulated less than 100 feet upon being discharged.  Patient's IV And Foley were discontinued on day #1 with Hemovac being discontinued on day #2. Dressing was changed on day 2 prior to patient being discharged   She was given perioperative antibiotics:  Anti-infectives    Start     Dose/Rate Route Frequency Ordered Stop   04/23/17 1400  ceFAZolin (ANCEF) IVPB 2g/100 mL premix     2 g 200 mL/hr over 30 Minutes Intravenous Every 6 hours 04/23/17 1203 04/24/17 0832   04/23/17 0600  ceFAZolin (ANCEF) IVPB 2g/100 mL premix     2 g 200 mL/hr over 30 Minutes Intravenous On call to O.R. 04/22/17 2229 04/23/17 0815    .  She was fitted with AV 1 compression foot pump devices, instructed on heel pumps, early ambulation, and fitted with TED stockings bilaterally for DVT prophylaxis.  She benefited maximally from the hospital stay and there were no complications.    Recent vital signs:  Vitals:   04/24/17 2345 04/25/17 0830  BP: 110/62 (!) 120/52  Pulse: 73   Resp: 19 18  Temp: 98.6 F (37 C) 98.3 F (36.8 C)    Recent laboratory studies:  Lab Results  Component Value Date   HGB 11.5 (L) 04/25/2017   HGB 11.9 (L) 04/24/2017   HGB 15.0 04/11/2017   Lab Results  Component Value Date   WBC 12.5 (H) 04/25/2017   PLT 150 04/25/2017   Lab Results  Component Value Date   INR 1.98 04/23/2017   Lab Results  Component Value Date   NA 137 04/25/2017   K 4.0 04/25/2017   CL 104 04/25/2017   CO2 28 04/25/2017   BUN 15 04/25/2017   CREATININE 0.59 04/25/2017   GLUCOSE 112 (H) 04/25/2017    Discharge Medications:   Allergies as of 04/25/2017      Reactions   Aspirin Other (See Comments)   On xarelto      Medication List    STOP taking these medications   HYDROcodone-acetaminophen 5-325 MG tablet Commonly known as:  NORCO/VICODIN     TAKE these medications   acetaminophen 650 MG CR tablet Commonly known as:  TYLENOL Take 650 mg by mouth every 8 (eight) hours as  needed for pain.   ACIDOPHILUS PO Take 1 tablet by mouth every evening.   alendronate 70 MG tablet Commonly known as:  FOSAMAX Take 1 tablet (70 mg total) by mouth every 7 (seven) days. Take with a full glass of water on an empty stomach. What changed:  additional instructions   ASPERCREME LIDOCAINE EX Apply 1 application topically daily as needed (pain).   b complex vitamins tablet Take 1 tablet by mouth daily. B 100   cholecalciferol 1000 units tablet Commonly known as:  VITAMIN D Take 1,000 Units by mouth daily.   CITRACAL PO Take 1-2 tablets by mouth 2 (two) times daily. 2 tablets in the morning and 1 tablet in the evening   Co Q-10 100 MG Caps Take 100 mg by mouth daily.   diltiazem 240 MG 24 hr capsule Commonly known as:  CARTIA XT Take 1 capsule (240 mg total) by mouth daily. What changed:  when to take this   gabapentin 100 MG capsule Commonly known as:  NEURONTIN Take 2 capsules (200 mg total) by  mouth 2 (two) times daily.   Krill Oil 300 MG Caps Take 350 mg by mouth daily.   MAGNESIUM-POTASSIUM PO Take 1 tablet by mouth at bedtime.   metoprolol tartrate 25 MG tablet Commonly known as:  LOPRESSOR Take 1 tablet (25 mg total) by mouth daily.   oxybutynin 5 MG 24 hr tablet Commonly known as:  DITROPAN-XL Take 1/2 tablet by mouth twice daily for urinary frequency.   oxyCODONE 5 MG immediate release tablet Commonly known as:  Oxy IR/ROXICODONE Take 1-2 tablets (5-10 mg total) by mouth every 6 (six) hours as needed for severe pain or breakthrough pain.   SELENIUM-VITAMIN E PO Take 1 tablet by mouth daily.   SOOTHE XP Soln Apply 1 drop to eye 2 (two) times daily as needed (dry eyes).   traMADol 50 MG tablet Commonly known as:  ULTRAM Take 1-2 tablets (50-100 mg total) by mouth every 4 (four) hours as needed for moderate pain.   TURMERIC PO Take 1 capsule by mouth 2 (two) times daily. 800 mg   VITAMIN B 12 PO Take 1 tablet by mouth daily.    XARELTO 20 MG Tabs tablet Generic drug:  rivaroxaban TAKE ONE TABLEY BY MOUTH DAILY WITH SUPPER            Durable Medical Equipment        Start     Ordered   04/23/17 1204  DME Walker rolling  Once    Question:  Patient needs a walker to treat with the following condition  Answer:  Total knee replacement status   04/23/17 1203   04/23/17 1204  DME Bedside commode  Once    Question:  Patient needs a bedside commode to treat with the following condition  Answer:  Total knee replacement status   04/23/17 1203      Diagnostic Studies: Dg Knee Left Port  Result Date: 04/23/2017 CLINICAL DATA:  Status post left total knee joint prosthesis placement. EXAM: PORTABLE LEFT KNEE - 1-2 VIEW COMPARISON:  None in PACs FINDINGS: The patient has undergone left total knee joint prosthesis placement. The positioning of the prosthetic components appears good. The interface with the native bone is normal. Surgical drain lines and skin staples are present. IMPRESSION: There is no acute postprocedure complication following left total knee joint prosthesis placement. Electronically Signed   By: David  Martinique M.D.   On: 04/23/2017 11:21    Disposition: 03-Skilled Nursing Facility  Discharge Instructions    Diet - low sodium heart healthy    Complete by:  As directed    Diet - low sodium heart healthy    Complete by:  As directed    Diet - low sodium heart healthy    Complete by:  As directed    Increase activity slowly    Complete by:  As directed    Increase activity slowly    Complete by:  As directed    Increase activity slowly    Complete by:  As directed        Contact information for follow-up providers    Watt Climes, PA On 05/08/2017.   Specialty:  Physician Assistant Why:  at 1:15pm Contact information: [place] [address] [phone]        Dereck Leep, MD On 06/05/2017.   Specialty:  Orthopedic Surgery Why:  at  9:45am Contact information: [place] [address] [phone]  Contact information for after-discharge care    Destination    [facility] .   Specialty:  [place] information: [place] [phone]                   Signed: Dereck Leep 04/25/2017, 9:43 AM

## 2017-04-24 NOTE — Clinical Social Work Note (Signed)
Clinical Social Work Assessment  Patient Details  Name: [name] [name] MRN: [id number] Date of Birth: January 20, 1941  Date of referral:  04/24/17               Reason for consult:  Facility Placement                Permission sought to share information with:  Chartered certified accountant granted to share information::  Yes, Verbal Permission Granted  Name::      [place]::   [place]   Relationship::     Contact Information:     Housing/Transportation Living arrangements for the past 2 months:  [place] of Information:  Patient Patient Interpreter Needed:  None Criminal Activity/Legal Involvement Pertinent to Current Situation/Hospitalization:  No - Comment as needed Significant Relationships:  Adult Children Lives with:  Self Do you feel safe going back to the place where you live?  Yes Need for family participation in patient care:  Yes (Comment)  Care giving concerns:  Patient lives at [facility] independent living through [facility] in [place].    Social Worker assessment / plan:  Holiday representative (CSW) received SNF consult. PT is recommending SNF. CSW met with patient alone at bedside to discuss D/C plan. Patient was alert and oriented X4 and was sitting up in the chair at bedside. CSW introduced self and explained role of CSW department. Patient reported that she lives alone in independent living and her daughter [name] is her primary support. CSW explained SNF process and that Health Team will have to approve SNF. Patient verbalized her understanding and is agreeable to SNF search in [place]. Patient prefers [facility] FL2 complete and faxed out.   CSW presented bed offers to patient and she chose [facility] admissions coordinator at [facility] is aware of accepted bed offer. Health Team case manager is aware of above. CSW will continue to follow and assist as needed.    Employment status:  Retired Nurse, adult PT Recommendations:  [place] / Referral to community resources:  [place]  Patient/Family's Response to care:  Patient accepted bed offer from [facility] and is okay with a semi-private room if that is all [facility] has available.   Patient/Family's Understanding of and Emotional Response to Diagnosis, Current Treatment, and Prognosis:  Patient was very pleasant and thanked CSW for assistance.   Emotional Assessment Appearance:  Appears stated age Attitude/Demeanor/Rapport:    Affect (typically observed):  Accepting, Adaptable, Pleasant Orientation:  Oriented to Self, Oriented to Place, Oriented to  Time, Oriented to Situation Alcohol / Substance use:  Not Applicable Psych involvement (Current and /or in the community):  No (Comment)  Discharge Needs  Concerns to be addressed:  Discharge Planning Concerns Readmission within the last 30 days:  No Current discharge risk:  Dependent with Mobility Barriers to Discharge:  Continued Medical Work up   [employer], Veronia Beets, LCSW 04/24/2017, 11:57 AM

## 2017-04-25 DIAGNOSIS — R000 Tachycardia, unspecified: Secondary | ICD-10-CM | POA: Diagnosis not present

## 2017-04-25 DIAGNOSIS — M1712 Unilateral primary osteoarthritis, left knee: Secondary | ICD-10-CM | POA: Diagnosis not present

## 2017-04-25 DIAGNOSIS — Z7901 Long term (current) use of anticoagulants: Secondary | ICD-10-CM | POA: Diagnosis not present

## 2017-04-25 DIAGNOSIS — M25562 Pain in left knee: Secondary | ICD-10-CM | POA: Diagnosis not present

## 2017-04-25 DIAGNOSIS — I10 Essential (primary) hypertension: Secondary | ICD-10-CM | POA: Diagnosis not present

## 2017-04-25 DIAGNOSIS — R262 Difficulty in walking, not elsewhere classified: Secondary | ICD-10-CM | POA: Diagnosis not present

## 2017-04-25 DIAGNOSIS — R29898 Other symptoms and signs involving the musculoskeletal system: Secondary | ICD-10-CM | POA: Diagnosis not present

## 2017-04-25 DIAGNOSIS — Z8673 Personal history of transient ischemic attack (TIA), and cerebral infarction without residual deficits: Secondary | ICD-10-CM | POA: Diagnosis not present

## 2017-04-25 DIAGNOSIS — R350 Frequency of micturition: Secondary | ICD-10-CM | POA: Diagnosis not present

## 2017-04-25 DIAGNOSIS — M6281 Muscle weakness (generalized): Secondary | ICD-10-CM | POA: Diagnosis not present

## 2017-04-25 DIAGNOSIS — R569 Unspecified convulsions: Secondary | ICD-10-CM | POA: Diagnosis not present

## 2017-04-25 DIAGNOSIS — Z96651 Presence of right artificial knee joint: Secondary | ICD-10-CM | POA: Diagnosis not present

## 2017-04-25 DIAGNOSIS — Z471 Aftercare following joint replacement surgery: Secondary | ICD-10-CM | POA: Diagnosis not present

## 2017-04-25 DIAGNOSIS — M810 Age-related osteoporosis without current pathological fracture: Secondary | ICD-10-CM | POA: Diagnosis not present

## 2017-04-25 DIAGNOSIS — Z96652 Presence of left artificial knee joint: Secondary | ICD-10-CM | POA: Diagnosis not present

## 2017-04-25 DIAGNOSIS — M25662 Stiffness of left knee, not elsewhere classified: Secondary | ICD-10-CM | POA: Diagnosis not present

## 2017-04-25 DIAGNOSIS — I482 Chronic atrial fibrillation: Secondary | ICD-10-CM | POA: Diagnosis not present

## 2017-04-25 LAB — CBC
HCT: 34.1 % — ABNORMAL LOW (ref 35.0–47.0)
Hemoglobin: 11.5 g/dL — ABNORMAL LOW (ref 12.0–16.0)
MCH: 32 pg (ref 26.0–34.0)
MCHC: 33.9 g/dL (ref 32.0–36.0)
MCV: 94.4 fL (ref 80.0–100.0)
Platelets: 150 10*3/uL (ref 150–440)
RBC: 3.61 MIL/uL — ABNORMAL LOW (ref 3.80–5.20)
RDW: 13.9 % (ref 11.5–14.5)
WBC: 12.5 10*3/uL — ABNORMAL HIGH (ref 3.6–11.0)

## 2017-04-25 LAB — BASIC METABOLIC PANEL
Anion gap: 5 (ref 5–15)
BUN: 15 mg/dL (ref 6–20)
CO2: 28 mmol/L (ref 22–32)
Calcium: 8.7 mg/dL — ABNORMAL LOW (ref 8.9–10.3)
Chloride: 104 mmol/L (ref 101–111)
Creatinine, Ser: 0.59 mg/dL (ref 0.44–1.00)
GFR calc Af Amer: >60 mL/min (ref >60)
GFR calc non Af Amer: >60 mL/min (ref >60)
Glucose, Bld: 112 mg/dL — ABNORMAL HIGH (ref 65–99)
Potassium: 4 mmol/L (ref 3.5–5.1)
Sodium: 137 mmol/L (ref 135–145)

## 2017-04-25 MED ORDER — LACTULOSE 10 GM/15ML PO SOLN: 10.0000 g | Freq: Two times a day (BID) | ORAL | Status: DC | PRN

## 2017-04-25 NOTE — Clinical Social Work Placement (Signed)
   CLINICAL SOCIAL WORK PLACEMENT  NOTE  Date:  04/25/2017  Patient Details  Name: [name] [name] MRN: [id number] Date of Birth: 02-22-1941  Clinical Social Work is seeking post-discharge placement for this patient at the [place] level of care (*CSW will initial, date and re-position this form in  chart as items are completed):  Yes   Patient/family provided with [place] Work Department's list of facilities offering this level of care within the geographic area requested by the patient (or if unable, by the patient's family).  Yes   Patient/family informed of their freedom to choose among providers that offer the needed level of care, that participate in Medicare, Medicaid or managed care program needed by the patient, have an available bed and are willing to accept the patient.  Yes   Patient/family informed of 's ownership interest in [facility] and [facility], as well as of the fact that they are under no obligation to receive care at these facilities.  PASRR submitted to EDS on       PASRR number received on       Existing PASRR number confirmed on 04/24/17     FL2 transmitted to all facilities in geographic area requested by pt/family on 04/24/17     FL2 transmitted to all facilities within larger geographic area on       Patient informed that his/her managed care company has contracts with or will negotiate with certain facilities, including the following:        Yes   Patient/family informed of bed offers received.  Patient chooses bed at  [facility] )     Physician recommends and patient chooses bed at      Patient to be transferred to  [facility] ) on 04/25/17.  Patient to be transferred to facility by  [facility] or daughter [name] will transport. )     Patient family notified on 04/25/17 of transfer.  Name of family member notified:   (Patient's daughter [name] is aware of D/C  today. )     PHYSICIAN       Additional Comment:    _______________________________________________ Jazlynne Milliner, Veronia Beets, LCSW 04/25/2017, 10:59 AM

## 2017-04-25 NOTE — Progress Notes (Addendum)
Patient is being discharged to [facility]. Report called and packet complete. Waiting for family to transport. LBM this shift. IV removed. Extra dressing sent in packet.

## 2017-04-25 NOTE — Progress Notes (Signed)
Patient is medically stable for D/C to [facility] today. Per [facility] admissions coordinator at [facility] patient can come today to room 201-B. Health Team SNF authorization has been received, auth # [id number]. RN will call report at [phone] and arrange EMS for transport if patient's daughter [name] is not able to transport patient. Clinical Education officer, museum (CSW) sent D/C orders to [facility] via [employer]. Patient is aware of above. CSW contacted patient's daughter [name] and made her aware of above. Patient requested for daughter to transport however if patient is limited with mobility then daughter and patient are agreeable to EMS transport. Please reconsult if future social work needs arise. CSW signing off.   [employer], LCSW [phone]

## 2017-04-25 NOTE — Progress Notes (Signed)
CH. stopped by to follow up on Pt. who was resting.

## 2017-04-25 NOTE — Progress Notes (Signed)
Physical Therapy Treatment Patient Details Name: [name] [name] MRN: [id number] DOB: 1940-11-13 Today's Date: 04/25/2017    History of Present Illness admitted status post L TKR (04/23/17), WBAT.    PT Comments    Participated in exercises as described below.  Pt able to ambulate 22' x 2 this am with min assist and walker. Participated in exercises as described below. Overall improved mobility but gait quality, strength and balance deficits remain.  She is unsafe to ambulate without +1 assist.  SNF remains appropriate for discharge.   Follow Up Recommendations  SNF     Equipment Recommendations  Rolling walker with 5" wheels    Recommendations for Other Services       Precautions / Restrictions Precautions Precautions: Fall Restrictions Weight Bearing Restrictions: Yes LLE Weight Bearing: Weight bearing as tolerated    Mobility  Bed Mobility Overal bed mobility: Needs Assistance Bed Mobility: Sit to Supine     Supine to sit: Min assist        Transfers Overall transfer level: Needs assistance Equipment used: Rolling walker (2 wheeled) Transfers: Sit to/from Stand           General transfer comment: slow to rise; hand placement cues.   Ambulation/Gait Ambulation/Gait assistance: Min assist Ambulation Distance (Feet): 22 Feet Assistive device: Rolling walker (2 wheeled)   Gait velocity: reduced   General Gait Details: 22' x 2 with slow gait parttern intitailly then increased gait speed and short choppy steps.  Verbal cues to correct.   Stairs            Wheelchair Mobility    Modified Rankin (Stroke Patients Only)       Balance Overall balance assessment: Needs assistance Sitting-balance support: No upper extremity supported;Feet supported;Bilateral upper extremity supported Sitting balance-Leahy Scale: Good     Standing balance support: Bilateral upper extremity supported Standing balance-Leahy Scale: Fair                               Cognition Arousal/Alertness: Awake/alert Behavior During Therapy: WFL for tasks assessed/performed Overall Cognitive Status: Within Functional Limits for tasks assessed                                        Exercises Total Joint Exercises Quad Sets: Strengthening;Both;20 reps;Supine Short Arc Quad: AAROM;Strengthening;Supine;Left;10 reps Heel Slides: AAROM;Strengthening;Supine;Left;10 reps Hip ABduction/ADduction: AAROM;Strengthening;Supine;Left;10 reps Straight Leg Raises: AAROM;Strengthening;Supine;Left;10 reps;AROM Long Arc Quad: AAROM;Strengthening;Left;10 reps;Seated Knee Flexion: AROM;AAROM;Strengthening;Left;Seated;10 reps Goniometric ROM: 2-80    General Comments        Pertinent Vitals/Pain Pain Assessment: 0-10 Pain Score: 3  Pain Location: L knee Pain Descriptors / Indicators: Aching;Grimacing;Guarding Pain Intervention(s): Limited activity within patient's tolerance;Ice applied;Monitored during session    Home Living                      Prior Function            PT Goals (current goals can now be found in the care plan section) Progress towards PT goals: Progressing toward goals    Frequency    BID      PT Plan Current plan remains appropriate    Co-evaluation              AM-PAC PT "6 Clicks" Daily Activity  Outcome Measure  Difficulty turning over in bed (including  adjusting bedclothes, sheets and blankets)?: Total Difficulty moving from lying on back to sitting on the side of the bed? : Total Difficulty sitting down on and standing up from a chair with arms (e.g., wheelchair, bedside commode, etc,.)?: Total Help needed moving to and from a bed to chair (including a wheelchair)?: A Little Help needed walking in hospital room?: A Little Help needed climbing 3-5 steps with a railing? : A Lot 6 Click Score: 11    End of Session Equipment Utilized During Treatment: Gait belt Activity Tolerance:  Patient tolerated treatment well Patient left: with call bell/phone within reach;in chair;with chair alarm set   Pain - Right/Left: Left Pain - part of body: Knee     Time: 6767-2094 PT Time Calculation (min) (ACUTE ONLY): 25 min  Charges:  $Gait Training: 8-22 mins $Therapeutic Exercise: 8-22 mins                    G Codes:       Chesley Noon, PTA 04/25/17, 9:58 AM

## 2017-04-25 NOTE — Progress Notes (Signed)
   Subjective: 2 Days Post-Op Procedure(s) (LRB): COMPUTER ASSISTED TOTAL KNEE ARTHROPLASTY (Left) Patient reports pain as 5 on 0-10 scale.  Complaining her more pain to the ankle heel area within the knee itself. Describes it as a burning sensation. Patient is well, and has had no acute complaints or problems We will start therapy today.  Plan is to go Rehab after hospital stay. no nausea and no vomiting Patient denies any chest pains or shortness of breath. Objective: Vital signs in last 24 hours: Temp:  [98.6 F (37 C)] 98.6 F (37 C) (07/03 2345) Pulse Rate:  [71-73] 73 (07/03 2345) Resp:  [19] 19 (07/03 2345) BP: (110)/(62) 110/62 (07/03 2345) SpO2:  [96 %-98 %] 98 % (07/03 2345) well approximated incision Heels are non tender and elevated off the bed using rolled towels Intake/Output from previous day: 07/03 0701 - 07/04 0700 In: 480 [P.O.:480] Out: 500 [Urine:500] Intake/Output this shift: No intake/output data recorded.   Recent Labs  04/24/17 0503 04/25/17 0314  HGB 11.9* 11.5*    Recent Labs  04/24/17 0503 04/25/17 0314  WBC 12.1* 12.5*  RBC 3.68* 3.61*  HCT 34.6* 34.1*  PLT 151 150    Recent Labs  04/24/17 0503 04/25/17 0314  NA 138 137  K 4.2 4.0  CL 108 104  CO2 25 28  BUN 15 15  CREATININE 0.64 0.59  GLUCOSE 131* 112*  CALCIUM 8.8* 8.7*    Recent Labs  04/23/17 0720  INR 1.98    EXAM General - Patient is Alert, Appropriate and Oriented Extremity - Neurologically intact Neurovascular intact Sensation intact distally Intact pulses distally Dorsiflexion/Plantar flexion intact No cellulitis present Compartment soft Dressing - dressing C/D/I Motor Function - intact, moving foot and toes well on exam.    Past Medical History:  Diagnosis Date  . Arthritis    osteo; in B knees;   . Bladder disorder   . Chronic a-fib ([place])   . DVT (deep venous thrombosis) ([place])    H/O  . Dyspnea   . Dysrhythmia    a fib  . Edema   . Heart  murmur   . Hypertension    controlled  . Seizure ([place])    2 years ago knocked unconscious; hospitalized and diagnosed with seizures; no spells in last year;   . Stroke ([place])    1 month ago (possible stroke)    Assessment/Plan: 2 Days Post-Op Procedure(s) (LRB): COMPUTER ASSISTED TOTAL KNEE ARTHROPLASTY (Left) Active Problems:   S/P total knee arthroplasty  Estimated body mass index is 30.8 kg/m as calculated from the following:   Height as of this encounter: 5\' 1"  (1.549 m).   Weight as of this encounter: 73.9 kg (163 lb). Up with therapy Discharge to SNF  Labs: Were reviewed and acceptable DVT Prophylaxis - Lovenox, Foot Pumps and TED hose Weight-Bearing as tolerated to left leg Hemovac discontinued on today's visit. Patient needs to have a bowel movement today. Lactulose was ordered. Patient may be discharged to rehabilitation once she has a bowel movement. Please wash the operative leg and apply TED stockings to both legs. Please change dressing prior to patient being discharged  Kamala Kolton R. [place] 04/25/2017, 7:25 AM

## 2017-04-27 DIAGNOSIS — R569 Unspecified convulsions: Secondary | ICD-10-CM | POA: Diagnosis not present

## 2017-04-27 DIAGNOSIS — M1712 Unilateral primary osteoarthritis, left knee: Secondary | ICD-10-CM | POA: Diagnosis not present

## 2017-04-27 DIAGNOSIS — Z8673 Personal history of transient ischemic attack (TIA), and cerebral infarction without residual deficits: Secondary | ICD-10-CM | POA: Diagnosis not present

## 2017-04-27 DIAGNOSIS — I482 Chronic atrial fibrillation: Secondary | ICD-10-CM | POA: Diagnosis not present

## 2017-05-03 ENCOUNTER — Encounter: Payer: Self-pay | Admitting: Gerontology

## 2017-05-03 ENCOUNTER — Non-Acute Institutional Stay (SKILLED_NURSING_FACILITY): Payer: PPO | Admitting: Gerontology

## 2017-05-03 DIAGNOSIS — R000 Tachycardia, unspecified: Secondary | ICD-10-CM

## 2017-05-03 DIAGNOSIS — M1712 Unilateral primary osteoarthritis, left knee: Secondary | ICD-10-CM

## 2017-05-03 DIAGNOSIS — Z96652 Presence of left artificial knee joint: Secondary | ICD-10-CM | POA: Diagnosis not present

## 2017-05-03 NOTE — Progress Notes (Signed)
Location:   [facility] Room Number: [place] of Service:  SNF [id number]) Provider:  Toni Arthurs, NP-C  Fisher, Kirstie Peri, MD  Patient Care Team: Birdie Sons, MD as PCP - General (Family Medicine) Birdie Sons, MD (Family Medicine) Vladimir Crofts, MD (Neurology) Minna Merritts, MD as Consulting Physician (Cardiology)  Extended Emergency Contact Information Primary Emergency Contact: [facility] Address: [address]          [place], [place] [address] Johnnette Litter of [place] Phone: [phone] Relation: Daughter  Code Status:  Full Goals of care: Advanced Directive information Advanced Directives 04/23/2017  Does Patient Have a Medical Advance Directive? No  Would patient like information on creating a medical advance directive? Yes (Inpatient - patient requests chaplain consult to create a medical advance directive)     Chief Complaint  Patient presents with  . Medical Management of Chronic Issues    Routine Visit    HPI:  Pt is a 76 y.o. female seen today for follow up. Pt was admitted to the facility for rehab following hospitalization for Left Total Knee Replacement. Pt has been working with PT and OT. Pt has been progressing well. She reports her appettie is good and is having regular BMs. Pt reports her pain is well controlled and is not having any concerns. On vitals review, it was noticed she has been tachycardic but is asymptomatic. Pt does have a h/o afib. Will increase metoprolol to BID. Otherwise, pt is doing well. Incision is CDI. VSS. No other complaints.      Past Medical History:  Diagnosis Date  . Arthritis    osteo; in B knees;   . Bladder disorder   . Chicken pox   . Chronic a-fib ([place])   . DVT (deep venous thrombosis) ([place])    H/O  . Dyspnea   . Dysrhythmia    a fib  . Edema   . Hay fever   . Heart murmur   . Hypertension    controlled  . Irregular heart beat   . Seizure ([place])    2 years ago  knocked unconscious; hospitalized and diagnosed with seizures; no spells in last year;   . Stroke [facility])    1 month ago (possible stroke)  . Unspecified atrial fibrillation [facility])    Past Surgical History:  Procedure Laterality Date  . ablation  [place], [place]  . ABLATION     for irregular heart beat  . bilateral wrist surgery  2003  . BREAST BIOPSY Left    stereo  . BREAST SURGERY    . CATARACT EXTRACTION W/PHACO Right 06/01/2015   Procedure: CATARACT EXTRACTION PHACO AND INTRAOCULAR LENS PLACEMENT (IOC);  Surgeon: Birder Robson, MD;  Location: [facility] ORS;  Service: Ophthalmology;  Laterality: Right;  [place]: 01:02.0 AP%: 23.1 CDE: 14.33 Fluid lot# [id number]   . CATARACT EXTRACTION W/PHACO Left 06/22/2015   Procedure: CATARACT EXTRACTION PHACO AND INTRAOCULAR LENS PLACEMENT (IOC);  Surgeon: Birder Robson, MD;  Location: [facility] ORS;  Service: Ophthalmology;  Laterality: Left;  [place]: 01:00.7 AP%: 23.1 CDE: 14.01 Lot # [id number]  . COLONOSCOPY    . DILATION AND CURETTAGE OF UTERUS    . JOINT REPLACEMENT    . KNEE ARTHROPLASTY Right 03/01/2016   Procedure: COMPUTER ASSISTED TOTAL KNEE ARTHROPLASTY;  Surgeon: Dereck Leep, MD;  Location: [facility] ORS;  Service: Orthopedics;  Laterality: Right;  . KNEE ARTHROPLASTY Left 04/23/2017   Procedure: COMPUTER ASSISTED TOTAL KNEE  ARTHROPLASTY;  Surgeon: Dereck Leep, MD;  Location: [facility] ORS;  Service: Orthopedics;  Laterality: Left;  . TONSILLECTOMY    . TOTAL KNEE ARTHROPLASTY Right     Allergies  Allergen Reactions  . Aspirin Other (See Comments)    On xarelto    Allergies as of 05/03/2017      Reactions   Aspirin Other (See Comments)   On xarelto      Medication List       Accurate as of 05/03/17  3:07 PM. Always use your most recent med list.          acetaminophen 650 MG CR tablet Commonly known as:  TYLENOL Take 650 mg by mouth every 8 (eight) hours as needed for pain.   ACIDOPHILUS PO Take 1 tablet by mouth at  bedtime.   ASPERCREME LIDOCAINE EX Apply 1 application topically daily as needed (pain). To area of pain   Calcium Citrate-Vitamin D 200-250 MG-UNIT Tabs Take 2 tabs by mouth each morning and 1 tab by mouth in evening.   cholecalciferol 1000 units tablet Commonly known as:  VITAMIN D Take 1,000 Units by mouth daily.   Co Q-10 100 MG Caps Take 100 mg by mouth daily.   diltiazem 240 MG 24 hr capsule Commonly known as:  CARDIZEM CD Take 240 mg by mouth daily.   EQL B COMPLEX 100 Tabs Take 0.4 mg tablet once daily by mouth   gabapentin 100 MG capsule Commonly known as:  NEURONTIN Take 2 capsules (200 mg total) by mouth 2 (two) times daily.   metoprolol tartrate 25 MG tablet Commonly known as:  LOPRESSOR Take 1 tablet (25 mg total) by mouth daily.   oxybutynin 5 MG 24 hr tablet Commonly known as:  DITROPAN-XL Take 1/2 tablet by mouth twice daily for urinary frequency.   oxyCODONE 5 MG immediate release tablet Commonly known as:  Oxy IR/ROXICODONE Take 1-2 tablets (5-10 mg total) by mouth every 6 (six) hours as needed for severe pain or breakthrough pain.   SOOTHE XP XTRA PROTECTION OP Place 1 drop into both eyes 2 (two) times daily as needed.   traMADol 50 MG tablet Commonly known as:  ULTRAM Take 1-2 tablets (50-100 mg total) by mouth every 4 (four) hours as needed for moderate pain.   XARELTO 20 MG Tabs tablet Generic drug:  rivaroxaban TAKE ONE TABLEY BY MOUTH DAILY WITH SUPPER       Review of Systems  Constitutional: Negative for activity change, appetite change, chills, diaphoresis and fever.  HENT: Negative for congestion, sneezing, sore throat, trouble swallowing and voice change.   Respiratory: Negative for apnea, cough, choking, chest tightness, shortness of breath and wheezing.   Cardiovascular: Negative for chest pain, palpitations and leg swelling.  Gastrointestinal: Negative for abdominal distention, abdominal pain, constipation, diarrhea and nausea.    Genitourinary: Negative for difficulty urinating, dysuria, frequency and urgency.  Musculoskeletal: Positive for arthralgias (typical arthritis). Negative for back pain, gait problem and myalgias.  Skin: Positive for wound. Negative for color change, pallor and rash.  Neurological: Negative for dizziness, tremors, syncope, speech difficulty, weakness, numbness and headaches.  Psychiatric/Behavioral: Negative for agitation and behavioral problems.  All other systems reviewed and are negative.   Immunization History  Administered Date(s) Administered  . Influenza Split 07/14/2009, 08/02/2010  . Influenza, High Dose Seasonal PF 07/24/2014, 09/22/2015, 08/02/2016  . Influenza,inj,Quad PF,36+ Mos 07/04/2013  . PPD Test 03/04/2016  . Pneumococcal Conjugate-13 07/24/2014  . Pneumococcal Polysaccharide-23 07/14/2009  .  Tdap 01/05/2011  . Zoster 11/29/2011   Pertinent  Health Maintenance Due  Topic Date Due  . COLONOSCOPY  01/16/2017  . INFLUENZA VACCINE  05/23/2017  . DEXA SCAN  Completed  . PNA vac Low Risk Adult  Completed   Fall Risk  08/02/2016  Falls in the past year? Yes  Number falls in past yr: 1  Injury with Fall? No   Functional Status Survey:    Vitals:   05/03/17 1436  BP: (!) 153/60  Pulse: 75  Resp: 20  Temp: 98.3 F (36.8 C)  SpO2: 98%  Weight: 169 lb 1.6 oz (76.7 kg)  Height: 5\' 1"  (1.549 m)   Body mass index is 31.95 kg/m. Physical Exam  Constitutional: She is oriented to person, place, and time. Vital signs are normal. She appears well-developed and well-nourished. She is active and cooperative. She does not appear ill. No distress.  HENT:  Head: Normocephalic and atraumatic.  Mouth/Throat: Uvula is midline, oropharynx is clear and moist and mucous membranes are normal. Mucous membranes are not pale, not dry and not cyanotic.  Eyes: Pupils are equal, round, and reactive to light. Conjunctivae, EOM and lids are normal.  Neck: Trachea normal, normal  range of motion and full passive range of motion without pain. Neck supple. No JVD present. No tracheal deviation, no edema and no erythema present. No thyromegaly present.  Cardiovascular: Regular rhythm, intact distal pulses and normal pulses.  Tachycardia present.  Exam reveals no gallop, no distant heart sounds and no friction rub.   No murmur heard. Pulses:      Dorsalis pedis pulses are 2+ on the right side, and 2+ on the left side.  Pulmonary/Chest: Effort normal and breath sounds normal. No accessory muscle usage. No respiratory distress. She has no decreased breath sounds. She has no wheezes. She has no rhonchi. She has no rales. She exhibits no tenderness.  Abdominal: Normal appearance and bowel sounds are normal. She exhibits no distension and no ascites. There is no tenderness.  Musculoskeletal: She exhibits no edema or tenderness.       Left knee: She exhibits decreased range of motion, swelling and laceration.  Expected osteoarthritis, stiffness; calves soft, supple. Negative Homan's sign  Neurological: She is alert and oriented to person, place, and time. She has normal strength.  Skin: Skin is warm and dry. Laceration (LTKA) noted. No rash noted. She is not diaphoretic. No cyanosis or erythema. No pallor. Nails show no clubbing.  Psychiatric: She has a normal mood and affect. Her speech is normal and behavior is normal. Judgment and thought content normal. Cognition and memory are normal.  Nursing note and vitals reviewed.   Labs reviewed:  Recent Labs  04/11/17 1124 04/24/17 0503 04/25/17 0314  NA 134* 138 137  K 4.0 4.2 4.0  CL 101 108 104  CO2 27 25 28   GLUCOSE 88 131* 112*  BUN 20 15 15   CREATININE 0.76 0.64 0.59  CALCIUM 9.5 8.8* 8.7*    Recent Labs  08/02/16 1109 04/11/17 1124  AST 24 26  ALT 17 22  ALKPHOS 89 40  BILITOT 0.7 0.9  PROT 6.7 7.4  ALBUMIN 4.5 4.5    Recent Labs  04/11/17 1124 04/24/17 0503 04/25/17 0314  WBC 7.2 12.1* 12.5*  HGB  15.0 11.9* 11.5*  HCT 44.3 34.6* 34.1*  MCV 95.6 94.0 94.4  PLT 191 151 150   Lab Results  Component Value Date   TSH 2.130 08/02/2016   Lab Results  Component Value Date   HGBA1C 5.4 08/25/2013   Lab Results  Component Value Date   CHOL 150 08/25/2013   HDL 66 (H) 08/25/2013   LDLCALC 71 08/25/2013   TRIG 64 08/25/2013    Significant Diagnostic Results in last 30 days:  Dg Knee Left Port  Result Date: 04/23/2017 CLINICAL DATA:  Status post left total knee joint prosthesis placement. EXAM: PORTABLE LEFT KNEE - 1-2 VIEW COMPARISON:  None in PACs FINDINGS: The patient has undergone left total knee joint prosthesis placement. The positioning of the prosthetic components appears good. The interface with the native bone is normal. Surgical drain lines and skin staples are present. IMPRESSION: There is no acute postprocedure complication following left total knee joint prosthesis placement. Electronically Signed   By: David  Martinique M.D.   On: 04/23/2017 11:21    Assessment/Plan 1. Primary osteoarthritis of left knee 2. Status post total left knee replacement  Continue PT/OT  Continue exercises as taught by PT/OT  Continue Oxycodone 5 mg 1-2 tablets po Q 6 hours prn pain  Continue Tramadol 50 mg 1-2 tablets po Q 4 hours prn pain  Continue Tylenol 650 mg po Q 8 hours prn pain  Polar care when at rest  Dressing changes per protocol  3. Tachycardia  Increase Metoprolol to 25 mg po BID  Family/ staff Communication:   Total Time:  Documentation:  Face to Face:  Family/Phone:   Labs/tests ordered:    Medication list reviewed and assessed for continued appropriateness. Monthly medication orders reviewed and signed.  Vikki Ports, NP-C Geriatrics [facility] [phone], [place] [address] Cell Phone (Mon-Fri 8am-5pm):  [phone] On Call:  [phone] & follow prompts after 5pm & weekends Office Phone:   [phone] Office Fax:  [phone]

## 2017-05-08 ENCOUNTER — Encounter: Payer: Self-pay | Admitting: Gerontology

## 2017-05-08 ENCOUNTER — Non-Acute Institutional Stay (SKILLED_NURSING_FACILITY): Payer: PPO | Admitting: Gerontology

## 2017-05-08 DIAGNOSIS — R000 Tachycardia, unspecified: Secondary | ICD-10-CM | POA: Diagnosis not present

## 2017-05-08 DIAGNOSIS — M25662 Stiffness of left knee, not elsewhere classified: Secondary | ICD-10-CM | POA: Diagnosis not present

## 2017-05-08 DIAGNOSIS — M1712 Unilateral primary osteoarthritis, left knee: Secondary | ICD-10-CM

## 2017-05-08 DIAGNOSIS — M25562 Pain in left knee: Secondary | ICD-10-CM | POA: Diagnosis not present

## 2017-05-08 DIAGNOSIS — R29898 Other symptoms and signs involving the musculoskeletal system: Secondary | ICD-10-CM | POA: Diagnosis not present

## 2017-05-08 DIAGNOSIS — Z96652 Presence of left artificial knee joint: Secondary | ICD-10-CM | POA: Diagnosis not present

## 2017-05-08 NOTE — Progress Notes (Signed)
Location:   [facility] Room Number: [place] of Service:  SNF [phone])  Provider: Toni Arthurs, NP-C  PCP: Birdie Sons, MD Patient Care Team: Birdie Sons, MD as PCP - General (Family Medicine) Birdie Sons, MD (Family Medicine) Vladimir Crofts, MD (Neurology) Minna Merritts, MD as Consulting Physician (Cardiology)  Extended Emergency Contact Information Primary Emergency Contact: [facility] Address: [address]          [place], [place] [address] Johnnette Litter of [place] Phone: [phone] Relation: Daughter  Code Status: Full Goals of care:  Advanced Directive information Advanced Directives 05/08/2017  Does Patient Have a Medical Advance Directive? No  Would patient like information on creating a medical advance directive? -     Allergies  Allergen Reactions  . Aspirin Other (See Comments)    On xarelto    Chief Complaint  Patient presents with  . Discharge Note    Discharged from SNF    HPI:  76 y.o. female seen today for discharge evaluation. Pt was admitted to the facility for rehab following hospitalization for Left Total Knee Replacement. Pt has been working with PT and OT. Pt has been progressing well. She reports her appettie is good and is having regular BMs. Pt reports her pain is well controlled and is not having any concerns. On vitals review last week, it was noticed she has been tachycardic but is asymptomatic. Pt does have a h/o afib. Increased metoprolol to BID. Pulse is WNL now. Otherwise, pt is doing well. Incision is CDI. Pt reports she is excited about going home. VSS. No other complaints.       Past Medical History:  Diagnosis Date  . Arthritis    osteo; in B knees;   . Bladder disorder   . Chicken pox   . Chronic a-fib ([place])   . DVT (deep venous thrombosis) ([address])    H/O  . Dyspnea   . Dysrhythmia    a fib  . Edema   . Hay fever   . Heart murmur   . Hypertension    controlled  .  Irregular heart beat   . Seizure ([place])    2 years ago knocked unconscious; hospitalized and diagnosed with seizures; no spells in last year;   . Stroke [facility])    1 month ago (possible stroke)  . Unspecified atrial fibrillation [facility])     Past Surgical History:  Procedure Laterality Date  . ablation  [place], [place]  . ABLATION     for irregular heart beat  . bilateral wrist surgery  2003  . BREAST BIOPSY Left    stereo  . BREAST SURGERY    . CATARACT EXTRACTION W/PHACO Right 06/01/2015   Procedure: CATARACT EXTRACTION PHACO AND INTRAOCULAR LENS PLACEMENT (IOC);  Surgeon: Birder Robson, MD;  Location: [facility] ORS;  Service: Ophthalmology;  Laterality: Right;  [place]: 01:02.0 AP%: 23.1 CDE: 14.33 Fluid lot# [id number]   . CATARACT EXTRACTION W/PHACO Left 06/22/2015   Procedure: CATARACT EXTRACTION PHACO AND INTRAOCULAR LENS PLACEMENT (IOC);  Surgeon: Birder Robson, MD;  Location: [facility] ORS;  Service: Ophthalmology;  Laterality: Left;  [place]: 01:00.7 AP%: 23.1 CDE: 14.01 Lot # [id number]  . COLONOSCOPY    . DILATION AND CURETTAGE OF UTERUS    . JOINT REPLACEMENT    . KNEE ARTHROPLASTY Right 03/01/2016   Procedure: COMPUTER ASSISTED TOTAL KNEE ARTHROPLASTY;  Surgeon: Dereck Leep, MD;  Location: [facility] ORS;  Service: Orthopedics;  Laterality: Right;  . KNEE ARTHROPLASTY Left 04/23/2017   Procedure: COMPUTER ASSISTED TOTAL KNEE ARTHROPLASTY;  Surgeon: Dereck Leep, MD;  Location: [facility] ORS;  Service: Orthopedics;  Laterality: Left;  . TONSILLECTOMY    . TOTAL KNEE ARTHROPLASTY Right       reports that she quit smoking about 45 years ago. She has never used smokeless tobacco. She reports that she does not drink alcohol or use drugs. Social History   Social History  . Marital status: Divorced    Spouse name: N/A  . Number of children: 2  . Years of education: N/A   Occupational History  . Retired    Social History Main Topics  . Smoking status: Former Smoker    Quit date:  04/11/1972  . Smokeless tobacco: Never Used  . Alcohol use No     Comment: wine- occasionally  . Drug use: No  . Sexual activity: Not on file   Other Topics Concern  . Not on file   Social History Narrative   Retired, divorced, gets regular exercise ([employer]- 3x weekly; walks dog)   Functional Status Survey:    Allergies  Allergen Reactions  . Aspirin Other (See Comments)    On xarelto    Pertinent  Health Maintenance Due  Topic Date Due  . COLONOSCOPY  01/16/2017  . INFLUENZA VACCINE  05/23/2017  . DEXA SCAN  Completed  . PNA vac Low Risk Adult  Completed    Medications: Allergies as of 05/08/2017      Reactions   Aspirin Other (See Comments)   On xarelto      Medication List       Accurate as of 05/08/17 12:50 PM. Always use your most recent med list.          acetaminophen 650 MG CR tablet Commonly known as:  TYLENOL Take 650 mg by mouth every 8 (eight) hours as needed for pain.   ACIDOPHILUS PO Take 1 tablet by mouth at bedtime.   ASPERCREME W/LIDOCAINE 4 % cream Generic drug:  lidocaine Apply 1 application topically daily as needed. Apply to affected area for pain   Calcium Citrate-Vitamin D 200-250 MG-UNIT Tabs Take 2 tabs by mouth each morning and 1 tab by mouth in evening.   cholecalciferol 1000 units tablet Commonly known as:  VITAMIN D Take 1,000 Units by mouth daily.   Co Q-10 100 MG Caps Take 100 mg by mouth daily.   diltiazem 240 MG 24 hr capsule Commonly known as:  CARDIZEM CD Take 240 mg by mouth daily.   EQL B COMPLEX 100 Tabs Take 0.4 mg tablet once daily by mouth   gabapentin 100 MG capsule Commonly known as:  NEURONTIN Take 2 capsules (200 mg total) by mouth 2 (two) times daily.   Melatonin 3 MG Tabs Take 1 tablet by mouth at bedtime.   metoprolol tartrate 25 MG tablet Commonly known as:  LOPRESSOR Take 25 mg by mouth 2 (two) times daily.   oxybutynin 5 MG 24 hr tablet Commonly known as:  DITROPAN-XL Take 1/2 tablet  by mouth twice daily for urinary frequency.   oxyCODONE 5 MG immediate release tablet Commonly known as:  Oxy IR/ROXICODONE Take 1-2 tablets (5-10 mg total) by mouth every 6 (six) hours as needed for severe pain or breakthrough pain.   SOOTHE XP XTRA PROTECTION OP Place 1 drop into both eyes 2 (two) times daily as needed.   traMADol 50 MG tablet Commonly known as:  ULTRAM Take  1-2 tablets (50-100 mg total) by mouth every 4 (four) hours as needed for moderate pain.   XARELTO 20 MG Tabs tablet Generic drug:  rivaroxaban TAKE ONE TABLEY BY MOUTH DAILY WITH SUPPER       Review of Systems  Constitutional: Negative for activity change, appetite change, chills, diaphoresis and fever.  HENT: Negative for congestion, sneezing, sore throat, trouble swallowing and voice change.   Respiratory: Negative for apnea, cough, choking, chest tightness, shortness of breath and wheezing.   Cardiovascular: Negative for chest pain, palpitations and leg swelling.  Gastrointestinal: Negative for abdominal distention, abdominal pain, constipation, diarrhea and nausea.  Genitourinary: Negative for difficulty urinating, dysuria, frequency and urgency.  Musculoskeletal: Positive for arthralgias (typical arthritis). Negative for back pain, gait problem and myalgias.  Skin: Positive for wound. Negative for color change, pallor and rash.  Neurological: Negative for dizziness, tremors, syncope, speech difficulty, weakness, numbness and headaches.  Psychiatric/Behavioral: Negative for agitation and behavioral problems.  All other systems reviewed and are negative.   Vitals:   05/08/17 1240  BP: 137/61  Pulse: 61  Resp: 20  Temp: 98 F (36.7 C)  SpO2: 98%  Weight: 165 lb 3.2 oz (74.9 kg)  Height: 5\' 1"  (1.549 m)   Body mass index is 31.21 kg/m. Physical Exam  Constitutional: She is oriented to person, place, and time. Vital signs are normal. She appears well-developed and well-nourished. She is active  and cooperative. She does not appear ill. No distress.  HENT:  Head: Normocephalic and atraumatic.  Mouth/Throat: Uvula is midline, oropharynx is clear and moist and mucous membranes are normal. Mucous membranes are not pale, not dry and not cyanotic.  Eyes: Pupils are equal, round, and reactive to light. Conjunctivae, EOM and lids are normal.  Neck: Trachea normal, normal range of motion and full passive range of motion without pain. Neck supple. No JVD present. No tracheal deviation, no edema and no erythema present. No thyromegaly present.  Cardiovascular: Regular rhythm, intact distal pulses and normal pulses.  Tachycardia present.  Exam reveals no gallop, no distant heart sounds and no friction rub.   No murmur heard. Pulses:      Dorsalis pedis pulses are 2+ on the right side, and 2+ on the left side.  Pulmonary/Chest: Effort normal and breath sounds normal. No accessory muscle usage. No respiratory distress. She has no decreased breath sounds. She has no wheezes. She has no rhonchi. She has no rales. She exhibits no tenderness.  Abdominal: Normal appearance and bowel sounds are normal. She exhibits no distension and no ascites. There is no tenderness.  Musculoskeletal: She exhibits no edema or tenderness.       Left knee: She exhibits decreased range of motion, swelling and laceration.  Expected osteoarthritis, stiffness; calves soft, supple. Negative Homan's sign  Neurological: She is alert and oriented to person, place, and time. She has normal strength.  Skin: Skin is warm and dry. Laceration (LTKA) noted. No rash noted. She is not diaphoretic. No cyanosis or erythema. No pallor. Nails show no clubbing.  Psychiatric: She has a normal mood and affect. Her speech is normal and behavior is normal. Judgment and thought content normal. Cognition and memory are normal.  Nursing note and vitals reviewed.   Labs reviewed: Basic Metabolic Panel:  Recent Labs  04/11/17 1124 04/24/17 0503  04/25/17 0314  NA 134* 138 137  K 4.0 4.2 4.0  CL 101 108 104  CO2 27 25 28   GLUCOSE 88 131* 112*  BUN  20 15 15   CREATININE 0.76 0.64 0.59  CALCIUM 9.5 8.8* 8.7*   Liver Function Tests:  Recent Labs  08/02/16 1109 04/11/17 1124  AST 24 26  ALT 17 22  ALKPHOS 89 40  BILITOT 0.7 0.9  PROT 6.7 7.4  ALBUMIN 4.5 4.5   No results for input(s): LIPASE, AMYLASE in the last 8760 hours. No results for input(s): AMMONIA in the last 8760 hours. CBC:  Recent Labs  04/11/17 1124 04/24/17 0503 04/25/17 0314  WBC 7.2 12.1* 12.5*  HGB 15.0 11.9* 11.5*  HCT 44.3 34.6* 34.1*  MCV 95.6 94.0 94.4  PLT 191 151 150   Cardiac Enzymes: No results for input(s): CKTOTAL, CKMB, CKMBINDEX, TROPONINI in the last 8760 hours. BNP: Invalid input(s): POCBNP CBG: No results for input(s): GLUCAP in the last 8760 hours.  Procedures and Imaging Studies During Stay: Dg Knee Left Port  Result Date: 04/23/2017 CLINICAL DATA:  Status post left total knee joint prosthesis placement. EXAM: PORTABLE LEFT KNEE - 1-2 VIEW COMPARISON:  None in PACs FINDINGS: The patient has undergone left total knee joint prosthesis placement. The positioning of the prosthetic components appears good. The interface with the native bone is normal. Surgical drain lines and skin staples are present. IMPRESSION: There is no acute postprocedure complication following left total knee joint prosthesis placement. Electronically Signed   By: David  Martinique M.D.   On: 04/23/2017 11:21    Assessment/Plan:   1. Primary osteoarthritis of left knee 2. Status post total left knee replacement  Continue PT/OT  Continue exercises as taught by PT/OT  Continue Oxycodone 5 mg 1-2 tablets po Q 6 hours prn pain  Continue Tramadol 50 mg 1-2 tablets po Q 4 hours prn pain  Continue Tylenol 650 mg po Q 8 hours prn pain  Polar care when at rest  Dressing changes per protocol  Follow up with cardiologist and/or PCP asap after discharge for  continuity of care  3. Tachycardia  Stable  Continue Metoprolol to 25 mg po BID  Follow up with orthopedist asap after discharge for continuity of care   Patient is being discharged with the following home health services: OPPT/OT via [facility]   Patient is being discharged with the following durable medical equipment:  none  Patient has been advised to f/u with their PCP in 1-2 weeks to bring them up to date on their rehab stay.  Social services at facility was responsible for arranging this appointment.  Pt was provided with a 30 day supply of prescriptions for medications and refills must be obtained from their PCP.  For controlled substances, a more limited supply may be provided adequate until PCP appointment only.  Future labs/tests needed:    Family/ staff Communication:   Total Time:  Documentation:  Face to Face:  Family/Phone:  Vikki Ports, NP-C [facility] [address], [place] [address] Cell Phone (Mon-Fri 8am-5pm):  [phone] On Call:  [phone] & follow prompts after 5pm & weekends Office Phone:  [phone] Office Fax:  [phone]

## 2017-05-11 ENCOUNTER — Ambulatory Visit (INDEPENDENT_AMBULATORY_CARE_PROVIDER_SITE_OTHER): Payer: PPO | Admitting: Family Medicine

## 2017-05-11 ENCOUNTER — Encounter: Payer: Self-pay | Admitting: Family Medicine

## 2017-05-11 VITALS — BP 104/64 | HR 93 | Temp 98.0°F | Resp 16 | Wt 169.0 lb

## 2017-05-11 DIAGNOSIS — Z96652 Presence of left artificial knee joint: Secondary | ICD-10-CM

## 2017-05-11 DIAGNOSIS — I482 Chronic atrial fibrillation, unspecified: Secondary | ICD-10-CM

## 2017-05-11 DIAGNOSIS — R791 Abnormal coagulation profile: Secondary | ICD-10-CM

## 2017-05-11 LAB — POCT INR
INR: 1.4
PT: 17.1

## 2017-05-11 MED ORDER — OXYBUTYNIN CHLORIDE ER 5 MG PO TB24: ORAL_TABLET | ORAL | 3 refills | Status: DC

## 2017-05-11 NOTE — Progress Notes (Signed)
Patient: [name] [name] Female    DOB: 08-14-1941   76 y.o.   MRN: [id number] Visit Date: 05/11/2017  Today's Provider: Lelon Huh, MD   Chief Complaint  Patient presents with  . Hospitalization Follow-up   Subjective:    HPI   Ms. Lung was had a left total knee replacement performed on 04/23/2017, performed by Dr. Marry Guan. Her INR at that time was 1.9, and she was advised to get this level rechecked.   Follow up Hospitalization  Patient was admitted to [facility] on 04/23/2017 and discharged on 04/25/2017. She was treated for left knee OA. Treatment for this included left TKR. She was D/C to [facility] for PT. Telephone follow up was done on N/A She reports good compliance with treatment. She reports this condition is Improved.  ------------------------------------------------------------------------------------     Allergies  Allergen Reactions  . Aspirin Other (See Comments)    On xarelto     Current Outpatient Prescriptions:  .  acetaminophen (TYLENOL) 650 MG CR tablet, Take 650 mg by mouth every 8 (eight) hours as needed for pain., Disp: , Rfl:  .  Artificial Tear Solution (SOOTHE XP XTRA PROTECTION OP), Place 1 drop into both eyes 2 (two) times daily as needed., Disp: , Rfl:  .  B Complex-Biotin-FA (EQL B COMPLEX 100) TABS, Take 0.4 mg tablet once daily by mouth, Disp: , Rfl:  .  Calcium Citrate-Vitamin D 200-250 MG-UNIT TABS, Take 2 tabs by mouth each morning and 1 tab by mouth in evening., Disp: , Rfl:  .  cholecalciferol (VITAMIN D) 1000 units tablet, Take 1,000 Units by mouth daily., Disp: , Rfl:  .  Coenzyme Q10 (CO Q-10) 100 MG CAPS, Take 100 mg by mouth daily. , Disp: , Rfl:  .  diltiazem (CARDIZEM CD) 240 MG 24 hr capsule, Take 240 mg by mouth daily., Disp: , Rfl:  .  gabapentin (NEURONTIN) 100 MG capsule, Take 2 capsules (200 mg total) by mouth 2 (two) times daily., Disp: 120 capsule, Rfl: 6 .  Lactobacillus (ACIDOPHILUS PO),  Take 1 tablet by mouth at bedtime. , Disp: , Rfl:  .  lidocaine (ASPERCREME W/LIDOCAINE) 4 % cream, Apply 1 application topically daily as needed. Apply to affected area for pain, Disp: , Rfl:  .  metoprolol tartrate (LOPRESSOR) 25 MG tablet, Take 25 mg by mouth 2 (two) times daily., Disp: , Rfl:  .  oxybutynin (DITROPAN-XL) 5 MG 24 hr tablet, Take 1/2 tablet by mouth twice daily for urinary frequency., Disp: 60 tablet, Rfl: 11 .  traMADol (ULTRAM) 50 MG tablet, Take 1-2 tablets (50-100 mg total) by mouth every 4 (four) hours as needed for moderate pain., Disp: 60 tablet, Rfl: 0 .  XARELTO 20 MG TABS tablet, TAKE ONE TABLEY BY MOUTH DAILY WITH SUPPER, Disp: 90 tablet, Rfl: 4 .  oxyCODONE (OXY IR/ROXICODONE) 5 MG immediate release tablet, Take 1-2 tablets (5-10 mg total) by mouth every 6 (six) hours as needed for severe pain or breakthrough pain. (Patient not taking: Reported on 05/11/2017), Disp: 30 tablet, Rfl: 0  Review of Systems  Social History  Substance Use Topics  . Smoking status: Former Smoker    Quit date: 04/11/1972  . Smokeless tobacco: Never Used  . Alcohol use No     Comment: wine- occasionally   Objective:   BP 104/64 (BP Location: Left Arm, Patient Position: Sitting, Cuff Size: Normal)   Pulse 93   Temp 98 F (36.7 C) (Oral)  Resp 16   Wt 169 lb (76.7 kg)   SpO2 98%   BMI 31.93 kg/m  Vitals:   05/11/17 1508  BP: 104/64  Pulse: 93  Resp: 16  Temp: 98 F (36.7 C)  TempSrc: Oral  SpO2: 98%  Weight: 169 lb (76.7 kg)     Physical Exam   General Appearance:    Alert, cooperative, no distress  Eyes:    PERRL, conjunctiva/corneas clear, EOM's intact       Lungs:     Clear to auscultation bilaterally, respirations unlabored  Heart:    Regular rate and rhythm  Neurologic:   Awake, alert, oriented x 3. No apparent focal neurological           defect.       Results for orders placed or performed in visit on 05/11/17  POCT INR  Result Value Ref Range   INR 1.4     PT 17.1        Assessment & Plan:     1. Abnormal INR Within ranged expected for patient being on Xarelto - POCT INR  2. Chronic atrial fibrillation (HCC) Asymptomatic. Compliant with medication.  Continue aggressive risk factor modification.    3. Status post total left knee replacement        Lelon Huh, MD  [facility]

## 2017-05-14 DIAGNOSIS — M25562 Pain in left knee: Secondary | ICD-10-CM | POA: Diagnosis not present

## 2017-05-14 DIAGNOSIS — Z96652 Presence of left artificial knee joint: Secondary | ICD-10-CM | POA: Diagnosis not present

## 2017-05-17 DIAGNOSIS — Z96652 Presence of left artificial knee joint: Secondary | ICD-10-CM | POA: Diagnosis not present

## 2017-05-22 DIAGNOSIS — M25562 Pain in left knee: Secondary | ICD-10-CM | POA: Diagnosis not present

## 2017-05-22 DIAGNOSIS — M25662 Stiffness of left knee, not elsewhere classified: Secondary | ICD-10-CM | POA: Diagnosis not present

## 2017-05-22 DIAGNOSIS — Z96652 Presence of left artificial knee joint: Secondary | ICD-10-CM | POA: Diagnosis not present

## 2017-05-24 DIAGNOSIS — M25562 Pain in left knee: Secondary | ICD-10-CM | POA: Diagnosis not present

## 2017-05-24 DIAGNOSIS — Z96652 Presence of left artificial knee joint: Secondary | ICD-10-CM | POA: Diagnosis not present

## 2017-05-28 DIAGNOSIS — M25562 Pain in left knee: Secondary | ICD-10-CM | POA: Diagnosis not present

## 2017-05-28 DIAGNOSIS — Z96652 Presence of left artificial knee joint: Secondary | ICD-10-CM | POA: Diagnosis not present

## 2017-05-28 DIAGNOSIS — M25662 Stiffness of left knee, not elsewhere classified: Secondary | ICD-10-CM | POA: Diagnosis not present

## 2017-05-30 DIAGNOSIS — M25662 Stiffness of left knee, not elsewhere classified: Secondary | ICD-10-CM | POA: Diagnosis not present

## 2017-05-30 DIAGNOSIS — Z96652 Presence of left artificial knee joint: Secondary | ICD-10-CM | POA: Diagnosis not present

## 2017-05-30 DIAGNOSIS — M25562 Pain in left knee: Secondary | ICD-10-CM | POA: Diagnosis not present

## 2017-06-04 ENCOUNTER — Telehealth: Payer: Self-pay | Admitting: Family Medicine

## 2017-06-04 DIAGNOSIS — Z96652 Presence of left artificial knee joint: Secondary | ICD-10-CM | POA: Diagnosis not present

## 2017-06-04 NOTE — Telephone Encounter (Signed)
Left message about need to schedule AWV °

## 2017-06-05 DIAGNOSIS — Z96652 Presence of left artificial knee joint: Secondary | ICD-10-CM | POA: Diagnosis not present

## 2017-06-19 DIAGNOSIS — M41126 Adolescent idiopathic scoliosis, lumbar region: Secondary | ICD-10-CM | POA: Diagnosis not present

## 2017-07-04 ENCOUNTER — Ambulatory Visit (INDEPENDENT_AMBULATORY_CARE_PROVIDER_SITE_OTHER): Payer: PPO

## 2017-07-04 VITALS — BP 118/68 | HR 72 | Temp 98.2°F | Ht 61.0 in | Wt 166.8 lb

## 2017-07-04 DIAGNOSIS — Z0000 Encounter for general adult medical examination without abnormal findings: Secondary | ICD-10-CM

## 2017-07-04 DIAGNOSIS — Z23 Encounter for immunization: Secondary | ICD-10-CM

## 2017-07-04 NOTE — Patient Instructions (Signed)
[name] , Thank you for taking time to come for your Medicare Wellness Visit. I appreciate your ongoing commitment to your health goals. Please review the following plan we discussed and let me know if I can assist you in the future.   Screening recommendations/referrals: Colonoscopy: declined Mammogram: due currently Bone Density: up to date Recommended yearly ophthalmology/optometry visit for glaucoma screening and checkup Recommended yearly dental visit for hygiene and checkup  Vaccinations: Influenza vaccine: completed today Pneumococcal vaccine: completed series Tdap vaccine: up to date Shingles vaccine: completed 12/09/11  Advanced directives: Advance directive discussed with you today. I have provided a copy for you to complete at home and have notarized. Once this is complete please bring a copy in to our office so we can scan it into your chart.  Conditions/risks identified: Fall risk prevention; Obesity- Recommend eating more fruits in daily diet and substituting sweets for fruits.   Next appointment: 08/06/17 @ 10:00 AM   Preventive Care 65 Years and Older, Female Preventive care refers to lifestyle choices and visits with your health care provider that can promote health and wellness. What does preventive care include?  A yearly physical exam. This is also called an annual well check.  Dental exams once or twice a year.  Routine eye exams. Ask your health care provider how often you should have your eyes checked.  Personal lifestyle choices, including:  Daily care of your teeth and gums.  Regular physical activity.  Eating a healthy diet.  Avoiding tobacco and drug use.  Limiting alcohol use.  Practicing safe sex.  Taking low-dose aspirin every day.  Taking vitamin and mineral supplements as recommended by your health care provider. What happens during an annual well check? The services and screenings done by your health care provider during your  annual well check will depend on your age, overall health, lifestyle risk factors, and family history of disease. Counseling  Your health care provider may ask you questions about your:  Alcohol use.  Tobacco use.  Drug use.  Emotional well-being.  Home and relationship well-being.  Sexual activity.  Eating habits.  History of falls.  Memory and ability to understand (cognition).  Work and work Statistician.  Reproductive health. Screening  You may have the following tests or measurements:  Height, weight, and BMI.  Blood pressure.  Lipid and cholesterol levels. These may be checked every 5 years, or more frequently if you are over 46 years old.  Skin check.  Lung cancer screening. You may have this screening every year starting at age 41 if you have a 30-pack-year history of smoking and currently smoke or have quit within the past 15 years.  Fecal occult blood test (FOBT) of the stool. You may have this test every year starting at age 47.  Flexible sigmoidoscopy or colonoscopy. You may have a sigmoidoscopy every 5 years or a colonoscopy every 10 years starting at age 74.  Hepatitis C blood test.  Hepatitis B blood test.  Sexually transmitted disease (STD) testing.  Diabetes screening. This is done by checking your blood sugar (glucose) after you have not eaten for a while (fasting). You may have this done every 1-3 years.  Bone density scan. This is done to screen for osteoporosis. You may have this done starting at age 24.  Mammogram. This may be done every 1-2 years. Talk to your health care provider about how often you should have regular mammograms. Talk with your health care provider about your test results, treatment  options, and if necessary, the need for more tests. Vaccines  Your health care provider may recommend certain vaccines, such as:  Influenza vaccine. This is recommended every year.  Tetanus, diphtheria, and acellular pertussis (Tdap, Td)  vaccine. You may need a Td booster every 10 years.  Zoster vaccine. You may need this after age 29.  Pneumococcal 13-valent conjugate (PCV13) vaccine. One dose is recommended after age 84.  Pneumococcal polysaccharide (PPSV23) vaccine. One dose is recommended after age 23. Talk to your health care provider about which screenings and vaccines you need and how often you need them. This information is not intended to replace advice given to you by your health care provider. Make sure you discuss any questions you have with your health care provider. Document Released: 11/05/2015 Document Revised: 06/28/2016 Document Reviewed: 08/10/2015 Elsevier Interactive Patient Education  2017 [place] Prevention in the Home Falls can cause injuries. They can happen to people of all ages. There are many things you can do to make your home safe and to help prevent falls. What can I do on the outside of my home?  Regularly fix the edges of walkways and driveways and fix any cracks.  Remove anything that might make you trip as you walk through a door, such as a raised step or threshold.  Trim any bushes or trees on the path to your home.  Use bright outdoor lighting.  Clear any walking paths of anything that might make someone trip, such as rocks or tools.  Regularly check to see if handrails are loose or broken. Make sure that both sides of any steps have handrails.  Any raised decks and porches should have guardrails on the edges.  Have any leaves, snow, or ice cleared regularly.  Use sand or salt on walking paths during winter.  Clean up any spills in your garage right away. This includes oil or grease spills. What can I do in the bathroom?  Use night lights.  Install grab bars by the toilet and in the tub and shower. Do not use towel bars as grab bars.  Use non-skid mats or decals in the tub or shower.  If you need to sit down in the shower, use a plastic, non-slip  stool.  Keep the floor dry. Clean up any water that spills on the floor as soon as it happens.  Remove soap buildup in the tub or shower regularly.  Attach bath mats securely with double-sided non-slip rug tape.  Do not have throw rugs and other things on the floor that can make you trip. What can I do in the bedroom?  Use night lights.  Make sure that you have a light by your bed that is easy to reach.  Do not use any sheets or blankets that are too big for your bed. They should not hang down onto the floor.  Have a firm chair that has side arms. You can use this for support while you get dressed.  Do not have throw rugs and other things on the floor that can make you trip. What can I do in the kitchen?  Clean up any spills right away.  Avoid walking on wet floors.  Keep items that you use a lot in easy-to-reach places.  If you need to reach something above you, use a strong step stool that has a grab bar.  Keep electrical cords out of the way.  Do not use floor polish or wax that makes floors slippery.  If you must use wax, use non-skid floor wax.  Do not have throw rugs and other things on the floor that can make you trip. What can I do with my stairs?  Do not leave any items on the stairs.  Make sure that there are handrails on both sides of the stairs and use them. Fix handrails that are broken or loose. Make sure that handrails are as long as the stairways.  Check any carpeting to make sure that it is firmly attached to the stairs. Fix any carpet that is loose or worn.  Avoid having throw rugs at the top or bottom of the stairs. If you do have throw rugs, attach them to the floor with carpet tape.  Make sure that you have a light switch at the top of the stairs and the bottom of the stairs. If you do not have them, ask someone to add them for you. What else can I do to help prevent falls?  Wear shoes that:  Do not have high heels.  Have rubber bottoms.  Are  comfortable and fit you well.  Are closed at the toe. Do not wear sandals.  If you use a stepladder:  Make sure that it is fully opened. Do not climb a closed stepladder.  Make sure that both sides of the stepladder are locked into place.  Ask someone to hold it for you, if possible.  Clearly mark and make sure that you can see:  Any grab bars or handrails.  First and last steps.  Where the edge of each step is.  Use tools that help you move around (mobility aids) if they are needed. These include:  Canes.  Walkers.  Scooters.  Crutches.  Turn on the lights when you go into a dark area. Replace any light bulbs as soon as they burn out.  Set up your furniture so you have a clear path. Avoid moving your furniture around.  If any of your floors are uneven, fix them.  If there are any pets around you, be aware of where they are.  Review your medicines with your doctor. Some medicines can make you feel dizzy. This can increase your chance of falling. Ask your doctor what other things that you can do to help prevent falls. This information is not intended to replace advice given to you by your health care provider. Make sure you discuss any questions you have with your health care provider. Document Released: 08/05/2009 Document Revised: 03/16/2016 Document Reviewed: 11/13/2014 Elsevier Interactive Patient Education  2017 [employer].

## 2017-07-04 NOTE — Progress Notes (Signed)
Subjective:   [name] [name] is a 76 y.o. female who presents for Medicare Annual (Subsequent) preventive examination.  Review of Systems:  N/A  Cardiac Risk Factors include: advanced age (>43men, >9 women);hypertension;obesity (BMI >30kg/m2)     Objective:     Vitals: BP 118/68 (BP Location: Left Arm)   Pulse 72   Temp 98.2 F (36.8 C) (Oral)   Ht 5\' 1"  (1.549 m)   Wt 166 lb 12.8 oz (75.7 kg)   BMI 31.52 kg/m   Body mass index is 31.52 kg/m.   Tobacco History  Smoking Status  . Former Smoker  . Quit date: 04/11/1972  Smokeless Tobacco  . Never Used     Counseling given: Not Answered   Past Medical History:  Diagnosis Date  . Arthritis    osteo; in B knees;   . Bladder disorder   . Chicken pox   . Chronic a-fib ([place])   . DVT (deep venous thrombosis) ([place])    H/O  . Dyspnea   . Dysrhythmia    a fib  . Edema   . Hay fever   . Heart murmur   . Hypertension    controlled  . Irregular heart beat   . Seizure ([place])    2 years ago knocked unconscious; hospitalized and diagnosed with seizures; no spells in last year;   . Stroke [facility])    1 month ago (possible stroke)  . Unspecified atrial fibrillation [facility])    Past Surgical History:  Procedure Laterality Date  . ablation  [place], [place]  . ABLATION     for irregular heart beat  . bilateral wrist surgery  2003  . BREAST BIOPSY Left    stereo  . BREAST SURGERY    . CATARACT EXTRACTION W/PHACO Right 06/01/2015   Procedure: CATARACT EXTRACTION PHACO AND INTRAOCULAR LENS PLACEMENT (IOC);  Surgeon: Birder Robson, MD;  Location: [facility] ORS;  Service: Ophthalmology;  Laterality: Right;  [place]: 01:02.0 AP%: 23.1 CDE: 14.33 Fluid lot# [id number]   . CATARACT EXTRACTION W/PHACO Left 06/22/2015   Procedure: CATARACT EXTRACTION PHACO AND INTRAOCULAR LENS PLACEMENT (IOC);  Surgeon: Birder Robson, MD;  Location: [facility] ORS;  Service: Ophthalmology;  Laterality: Left;  [place]: 01:00.7 AP%: 23.1 CDE:  14.01 Lot # [id number]  . COLONOSCOPY    . DILATION AND CURETTAGE OF UTERUS    . JOINT REPLACEMENT    . KNEE ARTHROPLASTY Right 03/01/2016   Procedure: COMPUTER ASSISTED TOTAL KNEE ARTHROPLASTY;  Surgeon: Dereck Leep, MD;  Location: [facility] ORS;  Service: Orthopedics;  Laterality: Right;  . KNEE ARTHROPLASTY Left 04/23/2017   Procedure: COMPUTER ASSISTED TOTAL KNEE ARTHROPLASTY;  Surgeon: Dereck Leep, MD;  Location: [facility] ORS;  Service: Orthopedics;  Laterality: Left;  . TONSILLECTOMY    . TOTAL KNEE ARTHROPLASTY Right    Family History  Problem Relation Age of Onset  . Stroke Mother   . Congestive Heart Failure Mother   . Atrial fibrillation Mother   . Melanoma Mother   . Stroke Maternal Grandmother   . Arthritis Paternal Grandmother        RA  . Diabetes Paternal Grandfather    History  Sexual Activity  . Sexual activity: Not on file    Outpatient Encounter Prescriptions as of 07/04/2017  Medication Sig  . acetaminophen (TYLENOL) 650 MG CR tablet Take 650 mg by mouth every 8 (eight) hours as needed for pain.  . Artificial Tear Solution (SOOTHE XP XTRA PROTECTION OP) Place 1  drop into both eyes 2 (two) times daily as needed.  . B Complex-Biotin-FA (EQL B COMPLEX 100) TABS Take 0.4 mg tablet once daily by mouth  . Calcium Citrate-Vitamin D 200-250 MG-UNIT TABS Take 2 tabs by mouth each morning and 1 tab by mouth in evening.  . cholecalciferol (VITAMIN D) 1000 units tablet Take 1,000 Units by mouth daily.  . Coenzyme Q10 (CO Q-10) 100 MG CAPS Take 100 mg by mouth daily.   Marland Kitchen diltiazem (CARDIZEM CD) 240 MG 24 hr capsule Take 240 mg by mouth daily.  Marland Kitchen gabapentin (NEURONTIN) 100 MG capsule Take 2 capsules (200 mg total) by mouth 2 (two) times daily.  Marland Kitchen HYDROcodone-acetaminophen (NORCO/VICODIN) 5-325 MG tablet Take 1 tablet by mouth every 6 (six) hours as needed for moderate pain.  . Lactobacillus (ACIDOPHILUS PO) Take 1 tablet by mouth at bedtime.   . lidocaine (ASPERCREME  W/LIDOCAINE) 4 % cream Apply 1 application topically daily as needed. Apply to affected area for pain  . metoprolol tartrate (LOPRESSOR) 25 MG tablet Take 25 mg by mouth daily.   Marland Kitchen oxybutynin (DITROPAN-XL) 5 MG 24 hr tablet Take 1/2 tablet by mouth twice daily for urinary frequency.  Alveda Reasons 20 MG TABS tablet TAKE ONE TABLEY BY MOUTH DAILY WITH SUPPER  . oxyCODONE (OXY IR/ROXICODONE) 5 MG immediate release tablet Take 1-2 tablets (5-10 mg total) by mouth every 6 (six) hours as needed for severe pain or breakthrough pain. (Patient not taking: Reported on 05/11/2017)  . traMADol (ULTRAM) 50 MG tablet Take 1-2 tablets (50-100 mg total) by mouth every 4 (four) hours as needed for moderate pain. (Patient not taking: Reported on 07/04/2017)   No facility-administered encounter medications on file as of 07/04/2017.     Activities of Daily Living In your present state of health, do you have any difficulty performing the following activities: 07/04/2017 04/23/2017  Hearing? N N  Vision? N N  Difficulty concentrating or making decisions? N N  Walking or climbing stairs? Y Y  Comment recent total knee replacement -  Dressing or bathing? N N  Doing errands, shopping? Y N  Comment does not drive Facilities manager and eating ? N -  Using the Toilet? N -  In the past six months, have you accidently leaked urine? N -  Comment controlled by oxybutynin -  Do you have problems with loss of bowel control? N -  Managing your Medications? N -  Managing your Finances? N -  Housekeeping or managing your Housekeeping? N -  Some recent data might be hidden    Patient Care Team: Birdie Sons, MD as PCP - General (Family Medicine) Vladimir Crofts, MD (Neurology) Minna Merritts, MD as Consulting Physician (Cardiology) Sharlet Salina, MD as Referring Physician (Physical Medicine and Rehabilitation) Deetta Perla, MD as Consulting Physician (Neurosurgery) Birder Robson, MD as Referring Physician  (Ophthalmology)    Assessment:     Exercise Activities and Dietary recommendations Current Exercise Habits: Home exercise routine, Time (Minutes): 20, Frequency (Times/Week): 3, Weekly Exercise (Minutes/Week): 60, Intensity: Mild, Exercise limited by: orthopedic condition(s);Other - see comments (arthritis)  Goals    . Eat more fruits and vegetables          Recommend eating more fruits in daily diet and substituting sweets for fruits.       Fall Risk Fall Risk  07/04/2017 08/02/2016  Falls in the past year? Yes Yes  Number falls in past yr: 2 or more 1  Injury  with Fall? No No  Follow up Falls prevention discussed -   Depression Screen PHQ 2/9 Scores 07/04/2017 03/01/2017 08/02/2016  PHQ - 2 Score 0 0 0  PHQ- 9 Score - - 3     Cognitive Function     6CIT Screen 07/04/2017  What Year? 0 points  What month? 0 points  What time? 0 points  Count back from 20 0 points  Months in reverse 2 points  Repeat phrase 2 points  Total Score 4    Immunization History  Administered Date(s) Administered  . Influenza Split 07/14/2009, 08/02/2010  . Influenza, High Dose Seasonal PF 07/24/2014, 09/22/2015, 08/02/2016, 07/04/2017  . Influenza,inj,Quad PF,6+ Mos 07/04/2013  . PPD Test 03/04/2016  . Pneumococcal Conjugate-13 07/24/2014  . Pneumococcal Polysaccharide-23 07/14/2009  . Tdap 01/05/2011  . Zoster 11/29/2011   Screening Tests Health Maintenance  Topic Date Due  . COLONOSCOPY  01/16/2017  . TETANUS/TDAP  01/04/2021  . INFLUENZA VACCINE  Completed  . DEXA SCAN  Completed  . PNA vac Low Risk Adult  Completed      Plan:  I have personally reviewed and addressed the Medicare Annual Wellness questionnaire and have noted the following in the patient's chart:  A. Medical and social history B. Use of alcohol, tobacco or illicit drugs  C. Current medications and supplements D. Functional ability and status E.  Nutritional status F.  Physical activity G. Advance  directives H. List of other physicians I.  Hospitalizations, surgeries, and ER visits in previous 12 months J.  [place] such as hearing and vision if needed, cognitive and depression L. Referrals and appointments - none  In addition, I have reviewed and discussed with patient certain preventive protocols, quality metrics, and best practice recommendations. A written personalized care plan for preventive services as well as general preventive health recommendations were provided to patient.  See attached scanned questionnaire for additional information.   Signed,  Fabio Neighbors, LPN Nurse Health Advisor   MD Recommendations: None. Pt declined colonoscopy referral today.

## 2017-07-13 DIAGNOSIS — M545 Low back pain: Secondary | ICD-10-CM | POA: Diagnosis not present

## 2017-07-16 ENCOUNTER — Telehealth: Payer: Self-pay | Admitting: Family Medicine

## 2017-07-16 MED ORDER — OXYBUTYNIN CHLORIDE 5 MG PO TABS: 5.0000 mg | ORAL_TABLET | Freq: Two times a day (BID) | ORAL | 3 refills | Status: DC

## 2017-07-16 NOTE — Telephone Encounter (Signed)
Pt is taking oxybutynin xl 5 mg and she wants to know if she can start taking regular not xl in a different mg.  She wants a 81month supply because it is less expensive.  She uses [facility]  Her call back [phone]  Thanks [name]

## 2017-07-16 NOTE — Telephone Encounter (Signed)
Please review. Thanks!  

## 2017-07-19 ENCOUNTER — Encounter: Payer: Self-pay | Admitting: Family Medicine

## 2017-07-25 DIAGNOSIS — M5136 Other intervertebral disc degeneration, lumbar region: Secondary | ICD-10-CM | POA: Diagnosis not present

## 2017-07-25 DIAGNOSIS — M48062 Spinal stenosis, lumbar region with neurogenic claudication: Secondary | ICD-10-CM | POA: Diagnosis not present

## 2017-07-25 DIAGNOSIS — M5416 Radiculopathy, lumbar region: Secondary | ICD-10-CM | POA: Diagnosis not present

## 2017-07-26 ENCOUNTER — Other Ambulatory Visit: Payer: Self-pay | Admitting: Family Medicine

## 2017-07-26 MED ORDER — GABAPENTIN 100 MG PO CAPS: 200.0000 mg | ORAL_CAPSULE | Freq: Two times a day (BID) | ORAL | 6 refills | Status: DC

## 2017-07-26 NOTE — Telephone Encounter (Signed)
Pt contacted office for refill request on the following medications:  gabapentin (NEURONTIN) 100 MG capsule.  90 day supply.  [facility].  CB#[phone]/MW  Pt is completely out

## 2017-07-28 ENCOUNTER — Other Ambulatory Visit: Payer: Self-pay | Admitting: Physician Assistant

## 2017-07-28 DIAGNOSIS — M79606 Pain in leg, unspecified: Principal | ICD-10-CM

## 2017-07-28 DIAGNOSIS — G8929 Other chronic pain: Secondary | ICD-10-CM

## 2017-07-28 MED ORDER — GABAPENTIN 100 MG PO CAPS: 200.0000 mg | ORAL_CAPSULE | Freq: Two times a day (BID) | ORAL | 0 refills | Status: DC

## 2017-07-28 NOTE — Progress Notes (Signed)
[facility] requested 90 day supply. Resent 90 day supply.

## 2017-08-06 ENCOUNTER — Encounter: Payer: PPO | Admitting: Family Medicine

## 2017-08-16 ENCOUNTER — Encounter: Payer: PPO | Admitting: Family Medicine

## 2017-08-23 DIAGNOSIS — M5416 Radiculopathy, lumbar region: Secondary | ICD-10-CM | POA: Diagnosis not present

## 2017-08-23 DIAGNOSIS — M48062 Spinal stenosis, lumbar region with neurogenic claudication: Secondary | ICD-10-CM | POA: Diagnosis not present

## 2017-08-23 DIAGNOSIS — M5136 Other intervertebral disc degeneration, lumbar region: Secondary | ICD-10-CM | POA: Diagnosis not present

## 2017-08-24 ENCOUNTER — Encounter: Payer: Self-pay | Admitting: Emergency Medicine

## 2017-08-24 ENCOUNTER — Encounter: Payer: PPO | Admitting: Family Medicine

## 2017-08-24 ENCOUNTER — Emergency Department: Payer: PPO

## 2017-08-24 ENCOUNTER — Emergency Department
Admission: EM | Admit: 2017-08-24 | Discharge: 2017-08-25 | Disposition: A | Discharge status: Home / Self Care | Payer: PPO | Attending: Emergency Medicine | Admitting: Emergency Medicine

## 2017-08-24 DIAGNOSIS — Z7983 Long term (current) use of bisphosphonates: Secondary | ICD-10-CM | POA: Diagnosis not present

## 2017-08-24 DIAGNOSIS — W01198A Fall on same level from slipping, tripping and stumbling with subsequent striking against other object, initial encounter: Secondary | ICD-10-CM | POA: Diagnosis not present

## 2017-08-24 DIAGNOSIS — Y93E9 Activity, other interior property and clothing maintenance: Secondary | ICD-10-CM | POA: Diagnosis not present

## 2017-08-24 DIAGNOSIS — Y929 Unspecified place or not applicable: Secondary | ICD-10-CM | POA: Insufficient documentation

## 2017-08-24 DIAGNOSIS — Z043 Encounter for examination and observation following other accident: Secondary | ICD-10-CM | POA: Diagnosis not present

## 2017-08-24 DIAGNOSIS — I10 Essential (primary) hypertension: Secondary | ICD-10-CM | POA: Diagnosis not present

## 2017-08-24 DIAGNOSIS — S0990XA Unspecified injury of head, initial encounter: Secondary | ICD-10-CM

## 2017-08-24 DIAGNOSIS — W19XXXA Unspecified fall, initial encounter: Secondary | ICD-10-CM

## 2017-08-24 DIAGNOSIS — Y999 Unspecified external cause status: Secondary | ICD-10-CM | POA: Diagnosis not present

## 2017-08-24 DIAGNOSIS — Z7901 Long term (current) use of anticoagulants: Secondary | ICD-10-CM | POA: Diagnosis not present

## 2017-08-24 NOTE — ED Triage Notes (Signed)
Pt reprots fell this evening hit her head, denies LOC pt ambulatory to triage history of brain aneurism taking xarelto

## 2017-08-25 ENCOUNTER — Telehealth: Payer: Self-pay | Admitting: Family Medicine

## 2017-08-25 NOTE — Telephone Encounter (Signed)
Pt will be coming in office for hospital f/u 08/29/17

## 2017-08-25 NOTE — ED Provider Notes (Signed)
[facility] Emergency Department Provider Note  ____________________________________________   First MD Initiated Contact with Patient 08/25/17 0001     (approximate)  I have reviewed the triage vital signs and the nursing notes.   HISTORY  Chief Complaint Fall   HPI [name] [name] is a 76 y.o. female who comes to the emergency department with mild right frontal headache that began suddenly when she had a mechanical fall shortly prior to arrival.  She was standing up trying to put on her socks when she leaned forward and fell and struck her right forehead against a wall.  She had no loss of consciousness.  Her pain began suddenly is moderate seems to be improved with time and nothing in particular makes it worse.  She has no numbness or weakness.  No double vision or blurred vision.  She does have a past medical history of atrial fibrillation for which she takes xarelto.  Past Medical History:  Diagnosis Date  . Arthritis    osteo; in B knees;   . Bladder disorder   . Chicken pox   . Chronic a-fib ([place])   . DVT (deep venous thrombosis) ([place])    H/O  . Dyspnea   . Dysrhythmia    a fib  . Edema   . Hay fever   . Heart murmur   . Hypertension    controlled  . Irregular heart beat   . Seizure ([place])    2 years ago knocked unconscious; hospitalized and diagnosed with seizures; no spells in last year;   . Stroke [facility])    1 month ago (possible stroke)  . Unspecified atrial fibrillation [facility])     Patient Active Problem List   Diagnosis Date Noted  . Osteoporosis 08/02/2016  . History of colon polyps 08/02/2016  . Low back pain 06/20/2016  . S/P total knee arthroplasty 03/01/2016  . Knee osteoarthritis 02/16/2016  . Weakness 09/27/2015  . 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 History of CVA (cerebrovascular accident) 02/16/2014  . Seizures ([place]) 02/16/2014  . Chronic leg pain 02/16/2014  . Fatigue 07/02/2013  .  HYPERTENSION, BENIGN 12/07/2010  . ATRIAL FIBRILLATION 06/29/2009    Past Surgical History:  Procedure Laterality Date  . ablation  [place], [place]  . ABLATION     for irregular heart beat  . bilateral wrist surgery  2003  . BREAST BIOPSY Left    stereo  . BREAST SURGERY    . CATARACT EXTRACTION W/PHACO Right 06/01/2015   Procedure: CATARACT EXTRACTION PHACO AND INTRAOCULAR LENS PLACEMENT (IOC);  Surgeon: Birder Robson, MD;  Location: [facility] ORS;  Service: Ophthalmology;  Laterality: Right;  [place]: 01:02.0 AP%: 23.1 CDE: 14.33 Fluid lot# [id number]   . CATARACT EXTRACTION W/PHACO Left 06/22/2015   Procedure: CATARACT EXTRACTION PHACO AND INTRAOCULAR LENS PLACEMENT (IOC);  Surgeon: Birder Robson, MD;  Location: [facility] ORS;  Service: Ophthalmology;  Laterality: Left;  [place]: 01:00.7 AP%: 23.1 CDE: 14.01 Lot # [id number]  . COLONOSCOPY    . DILATION AND CURETTAGE OF UTERUS    . JOINT REPLACEMENT    . KNEE ARTHROPLASTY Right 03/01/2016   Procedure: COMPUTER ASSISTED TOTAL KNEE ARTHROPLASTY;  Surgeon: Dereck Leep, MD;  Location: [facility] ORS;  Service: Orthopedics;  Laterality: Right;  . KNEE ARTHROPLASTY Left 04/23/2017   Procedure: COMPUTER ASSISTED TOTAL KNEE ARTHROPLASTY;  Surgeon: Dereck Leep, MD;  Location: [facility] ORS;  Service: Orthopedics;  Laterality: Left;  .  TONSILLECTOMY    . TOTAL KNEE ARTHROPLASTY Right     Prior to Admission medications   Medication Sig Start Date End Date Taking? Authorizing Provider  acetaminophen (TYLENOL) 650 MG CR tablet Take 650 mg by mouth every 8 (eight) hours as needed for pain.    [provider]  Artificial Tear Solution (SOOTHE XP XTRA PROTECTION OP) Place 1 drop into both eyes 2 (two) times daily as needed.    [provider]  B Complex-Biotin-FA (EQL B COMPLEX 100) TABS Take 0.4 mg tablet once daily by mouth    [provider]  Calcium Citrate-Vitamin D 200-250 MG-UNIT TABS Take 2 tabs by mouth each morning and 1  tab by mouth in evening.    [provider]  cholecalciferol (VITAMIN D) 1000 units tablet Take 1,000 Units by mouth daily.    [provider]  Coenzyme Q10 (CO Q-10) 100 MG CAPS Take 100 mg by mouth daily.     [provider]  diltiazem (CARDIZEM CD) 240 MG 24 hr capsule Take 240 mg by mouth daily.    [provider]  gabapentin (NEURONTIN) 100 MG capsule Take 2 capsules (200 mg total) by mouth 2 (two) times daily. 07/28/17 10/26/17  Trinna Post, PA-C  HYDROcodone-acetaminophen (NORCO/VICODIN) 5-325 MG tablet Take 1 tablet by mouth every 6 (six) hours as needed for moderate pain.    [provider]  Lactobacillus (ACIDOPHILUS PO) Take 1 tablet by mouth at bedtime.     [provider]  lidocaine (ASPERCREME W/LIDOCAINE) 4 % cream Apply 1 application topically daily as needed. Apply to affected area for pain    [provider]  metoprolol tartrate (LOPRESSOR) 25 MG tablet Take 25 mg by mouth daily.     [provider]  oxybutynin (DITROPAN) 5 MG tablet Take 1 tablet (5 mg total) by mouth 2 (two) times daily. 07/16/17   Birdie Sons, MD  oxyCODONE (OXY IR/ROXICODONE) 5 MG immediate release tablet Take 1-2 tablets (5-10 mg total) by mouth every 6 (six) hours as needed for severe pain or breakthrough pain. Patient not taking: Reported on 05/11/2017 04/24/17   Watt Climes, PA  traMADol (ULTRAM) 50 MG tablet Take 1-2 tablets (50-100 mg total) by mouth every 4 (four) hours as needed for moderate pain. Patient not taking: Reported on 07/04/2017 04/24/17   Watt Climes, PA  XARELTO 20 MG TABS tablet TAKE ONE TABLEY BY MOUTH DAILY WITH SUPPER 02/07/17   Birdie Sons, MD    Allergies Aspirin  Family History  Problem Relation Age of Onset  . Stroke Mother   . Congestive Heart Failure Mother   . Atrial fibrillation Mother   . Melanoma Mother   . Stroke Maternal Grandmother   . Arthritis Paternal Grandmother        RA  .  Diabetes Paternal Grandfather     Social History Social History  Substance Use Topics  . Smoking status: Former Smoker    Quit date: 04/11/1972  . Smokeless tobacco: Never Used  . Alcohol use No    Review of Systems Constitutional: No fever/chills Eyes: No visual changes. ENT: No sore throat. Cardiovascular: Denies chest pain. Respiratory: Denies shortness of breath. Gastrointestinal: No abdominal pain.  No nausea, no vomiting.  No diarrhea.  No constipation. Genitourinary: Negative for dysuria. Musculoskeletal: Negative for back pain. Skin: Negative for rash. Neurological: Positive for headache   ____________________________________________   PHYSICAL EXAM:  VITAL SIGNS: ED Triage Vitals  Enc Vitals Group     BP      Pulse      Resp      Temp      Temp src      SpO2      Weight      Height      Head Circumference      Peak Flow      Pain Score      Pain Loc      Pain Edu?      Excl. in [place]?     Constitutional: Alert and oriented x4 well-appearing nontoxic no diaphoresis speaks full clear sentences Eyes: PERRL EOMI. Head: Atraumatic. Nose: No congestion/rhinnorhea. Mouth/Throat: No trismus Neck: No stridor.   Cardiovascular: Normal rate, regular rhythm. Grossly normal heart sounds.  Good peripheral circulation. Respiratory: Normal respiratory effort.  No retractions. Lungs CTAB and moving good air Gastrointestinal: Soft nontender Musculoskeletal: No lower extremity edema   Neurologic:  Normal speech and language. No gross focal neurologic deficits are appreciated. Skin:  Skin is warm, dry and intact. No rash noted. Psychiatric: Mood and affect are normal. Speech and behavior are normal.    ____________________________________________   DIFFERENTIAL includes but not limited to  Intracerebral hemorrhage, cervical spine fracture, mechanical fall, syncope ____________________________________________   LABS (all labs ordered are listed, but only  abnormal results are displayed)  Labs Reviewed - No data to display   __________________________________________  EKG   ____________________________________________  RADIOLOGY  Head CT reviewed by me with no acute disease ____________________________________________   PROCEDURES  Procedure(s) performed: no  Procedures  Critical Care performed: no  Observation: no ____________________________________________   INITIAL IMPRESSION / ASSESSMENT AND PLAN / ED COURSE  Pertinent labs & imaging results that were available during my care of the patient were reviewed by me and considered in my medical decision making (see chart for details).  Patient is very well-appearing with no objective signs of trauma.  She had a head CT performed which is negative for acute bleed.  She is neuro intact.  Doubt cervical spine fracture.  Discussed with the patient strict return precautions and she is discharged home in improved condition.  She verbalizes understanding and agreement with the plan.      ____________________________________________   FINAL CLINICAL IMPRESSION(S) / ED DIAGNOSES  Final diagnoses:  Accident due to mechanical fall without injury, initial encounter  Minor head injury, initial encounter      NEW MEDICATIONS STARTED DURING THIS VISIT:  New Prescriptions   No medications on file     Note:  This document was prepared using Dragon voice recognition software and may include unintentional dictation errors.     Darel Hong, MD 08/25/17 [id number]

## 2017-08-25 NOTE — Discharge Instructions (Signed)
Please continue taking all of your medications as prescribed and follow-up with your primary care physician as needed.  Return to the ED for any concerns whatsoever.  It was a pleasure to take care of you today, and thank you for coming to our emergency department.  If you have any questions or concerns before leaving please ask the nurse to grab me and I'm more than happy to go through your aftercare instructions again.  If you were prescribed any opioid pain medication today such as Norco, Vicodin, Percocet, morphine, hydrocodone, or oxycodone please make sure you do not drive when you are taking this medication as it can alter your ability to drive safely.  If you have any concerns once you are home that you are not improving or are in fact getting worse before you can make it to your follow-up appointment, please do not hesitate to call 911 and come back for further evaluation.  Darel Hong, MD  Results for orders placed or performed in visit on 05/11/17  POCT INR  Result Value Ref Range   INR 1.4    PT 17.1    Ct Head Wo Contrast  Result Date: 08/24/2017 CLINICAL DATA:  Patient fell hitting head this evening. Patient on Xarelto. EXAM: CT HEAD WITHOUT CONTRAST TECHNIQUE: Contiguous axial images were obtained from the base of the skull through the vertex without intravenous contrast. COMPARISON:  None. FINDINGS: Brain: Superficial atrophy with chronic mild-to-moderate small vessel ischemia. No acute intracranial hemorrhage, midline shift or edema. No intra-axial mass nor extra-axial fluid collections. Vascular: Moderate atherosclerosis of the carotid siphons. No hyperdense vessels. No unexpected calcifications. Skull: Negative for fracture or focal osseous lesions. Sinuses/Orbits: No acute finding. Other: None IMPRESSION: Atrophy with chronic small vessel ischemic disease. No acute intracranial abnormality. Electronically Signed   By: Ashley Royalty M.D.   On: 08/24/2017 22:19

## 2017-08-27 NOTE — Telephone Encounter (Signed)
Pt was seen in the ED and not admitted to the hospital. Unable to make a TCM call due to this.  -MM

## 2017-08-29 ENCOUNTER — Ambulatory Visit: Payer: PPO | Admitting: Family Medicine

## 2017-08-29 ENCOUNTER — Ambulatory Visit
Admission: RE | Admit: 2017-08-29 | Discharge: 2017-08-29 | Disposition: A | Discharge status: Home / Self Care | Payer: PPO | Source: Ambulatory Visit | Attending: Family Medicine | Admitting: Family Medicine

## 2017-08-29 ENCOUNTER — Encounter: Payer: Self-pay | Admitting: Family Medicine

## 2017-08-29 VITALS — BP 110/68 | HR 60 | Temp 99.3°F | Resp 16 | Wt 171.0 lb

## 2017-08-29 DIAGNOSIS — S0083XA Contusion of other part of head, initial encounter: Secondary | ICD-10-CM | POA: Diagnosis not present

## 2017-08-29 DIAGNOSIS — M4185 Other forms of scoliosis, thoracolumbar region: Secondary | ICD-10-CM | POA: Insufficient documentation

## 2017-08-29 DIAGNOSIS — I700 Atherosclerosis of aorta: Secondary | ICD-10-CM | POA: Diagnosis not present

## 2017-08-29 DIAGNOSIS — M19011 Primary osteoarthritis, right shoulder: Secondary | ICD-10-CM | POA: Diagnosis not present

## 2017-08-29 DIAGNOSIS — R0781 Pleurodynia: Secondary | ICD-10-CM

## 2017-08-29 DIAGNOSIS — S299XXA Unspecified injury of thorax, initial encounter: Secondary | ICD-10-CM | POA: Diagnosis not present

## 2017-08-29 NOTE — Progress Notes (Signed)
Patient: [name] [name] Female    DOB: 1941-06-01   76 y.o.   MRN: [id number] Visit Date: 08/29/2017  Today's Provider: Lelon Huh, MD   Chief Complaint  Patient presents with  . Hospitalization Follow-up   Subjective:    HPI   Follow up ER visit  Patient was seen in ER for [facility] on 08/24/2017. She was treated for Fall and Head injury. Treatment for this included; Head CT obtained and was negative for acute bleed. Patient was discharged home in improved condition. She reports good compliance with treatment. She reports this condition is Improved.   She states she was reaching over a clothes hamper and fell forward to ground and hit right ribs on the edge of the hamper.She reports that she still has tenderness in her right rib area where she fell on the floor. She denies having any dizzy spells, but does have occasional headaches. And her right rib has been very painful.     Allergies  Allergen Reactions  . Aspirin Other (See Comments)    On xarelto     Current Outpatient Medications:  .  acetaminophen (TYLENOL) 650 MG CR tablet, Take 650 mg by mouth every 8 (eight) hours as needed for pain., Disp: , Rfl:  .  Artificial Tear Solution (SOOTHE XP XTRA PROTECTION OP), Place 1 drop into both eyes 2 (two) times daily as needed., Disp: , Rfl:  .  B Complex-Biotin-FA (EQL B COMPLEX 100) TABS, Take 0.4 mg tablet once daily by mouth, Disp: , Rfl:  .  Calcium Citrate-Vitamin D 200-250 MG-UNIT TABS, Take 2 tabs by mouth each morning and 1 tab by mouth in evening., Disp: , Rfl:  .  cholecalciferol (VITAMIN D) 1000 units tablet, Take 1,000 Units by mouth daily., Disp: , Rfl:  .  Coenzyme Q10 (CO Q-10) 100 MG CAPS, Take 100 mg by mouth daily. , Disp: , Rfl:  .  diltiazem (CARDIZEM CD) 240 MG 24 hr capsule, Take 240 mg by mouth daily., Disp: , Rfl:  .  gabapentin (NEURONTIN) 100 MG capsule, Take 2 capsules (200 mg total) by mouth 2 (two) times daily., Disp: 360 capsule, Rfl:  0 .  HYDROcodone-acetaminophen (NORCO/VICODIN) 5-325 MG tablet, Take 1 tablet by mouth every 6 (six) hours as needed for moderate pain., Disp: , Rfl:  .  Lactobacillus (ACIDOPHILUS PO), Take 1 tablet by mouth at bedtime. , Disp: , Rfl:  .  lidocaine (ASPERCREME W/LIDOCAINE) 4 % cream, Apply 1 application topically daily as needed. Apply to affected area for pain, Disp: , Rfl:  .  metoprolol tartrate (LOPRESSOR) 25 MG tablet, Take 25 mg by mouth daily. , Disp: , Rfl:  .  oxybutynin (DITROPAN) 5 MG tablet, Take 1 tablet (5 mg total) by mouth 2 (two) times daily., Disp: 180 tablet, Rfl: 3 .  traMADol (ULTRAM) 50 MG tablet, Take 1-2 tablets (50-100 mg total) by mouth every 4 (four) hours as needed for moderate pain., Disp: 60 tablet, Rfl: 0 .  XARELTO 20 MG TABS tablet, TAKE ONE TABLEY BY MOUTH DAILY WITH SUPPER, Disp: 90 tablet, Rfl: 4 .  oxyCODONE (OXY IR/ROXICODONE) 5 MG immediate release tablet, Take 1-2 tablets (5-10 mg total) by mouth every 6 (six) hours as needed for severe pain or breakthrough pain. (Patient not taking: Reported on 05/11/2017), Disp: 30 tablet, Rfl: 0  Review of Systems  Constitutional: Negative for appetite change, chills, fatigue and fever.  Respiratory: Negative for chest tightness and shortness of  breath.   Cardiovascular: Negative for chest pain and palpitations.  Gastrointestinal: Negative for abdominal pain, nausea and vomiting.  Musculoskeletal: Positive for arthralgias and myalgias.  Neurological: Positive for headaches. Negative for dizziness and weakness.    Social History   Tobacco Use  . Smoking status: Former Smoker    Last attempt to quit: 04/11/1972    Years since quitting: 45.4  . Smokeless tobacco: Never Used  Substance Use Topics  . Alcohol use: No    Alcohol/week: 0.0 oz   Objective:   BP 110/68 (BP Location: Right Arm, Patient Position: Sitting, Cuff Size: Normal)   Pulse 60   Temp 99.3 F (37.4 C)   Resp 16   Wt 171 lb (77.6 kg)   BMI  32.31 kg/m  Vitals:   08/29/17 1613  BP: 110/68  Pulse: 60  Resp: 16  Temp: 99.3 F (37.4 C)  Weight: 171 lb (77.6 kg)     Physical Exam   General Appearance:    Alert, cooperative, no distress  Head:   Mild contusion noted right forehead.   Eyes:    PERRL, conjunctiva/corneas clear, EOM's intact       Lungs:     Clear to auscultation bilaterally, respirations unlabored  Heart:    Regular rate and rhythm  Neurologic:   Awake, alert, oriented x 3. No apparent focal neurological           defect.   MS:   Point tenderness right lateral ribs and mild bruising noted.        Assessment & Plan:     1. Rib pain on right side  - DG Ribs Unilateral Right; Future  2. Contusion of forehead, initial encounter Self limited. No loc. Fall was purely mechanical.       Lelon Huh, MD  [facility]

## 2017-09-10 ENCOUNTER — Ambulatory Visit (INDEPENDENT_AMBULATORY_CARE_PROVIDER_SITE_OTHER): Payer: PPO | Admitting: Family Medicine

## 2017-09-10 ENCOUNTER — Encounter: Payer: Self-pay | Admitting: Family Medicine

## 2017-09-10 VITALS — BP 110/80 | HR 64 | Temp 97.7°F | Resp 16 | Ht 61.0 in | Wt 167.0 lb

## 2017-09-10 DIAGNOSIS — I482 Chronic atrial fibrillation, unspecified: Secondary | ICD-10-CM

## 2017-09-10 DIAGNOSIS — Z0000 Encounter for general adult medical examination without abnormal findings: Secondary | ICD-10-CM | POA: Diagnosis not present

## 2017-09-10 DIAGNOSIS — I10 Essential (primary) hypertension: Secondary | ICD-10-CM

## 2017-09-10 DIAGNOSIS — Z6831 Body mass index (BMI) 31.0-31.9, adult: Secondary | ICD-10-CM

## 2017-09-10 DIAGNOSIS — G8929 Other chronic pain: Secondary | ICD-10-CM | POA: Diagnosis not present

## 2017-09-10 DIAGNOSIS — M810 Age-related osteoporosis without current pathological fracture: Secondary | ICD-10-CM

## 2017-09-10 DIAGNOSIS — M545 Low back pain: Secondary | ICD-10-CM | POA: Diagnosis not present

## 2017-09-10 MED ORDER — OXYCODONE HCL 5 MG PO TABS: 5.0000 mg | ORAL_TABLET | Freq: Four times a day (QID) | ORAL | 0 refills | Status: DC | PRN

## 2017-09-10 NOTE — Progress Notes (Signed)
Patient: [name] [name], Female    DOB: 1941/03/07, 76 y.o.   MRN: [id number] Visit Date: 09/10/2017  Today's Provider: Lelon Huh, MD   Chief Complaint  Patient presents with  . Annual Exam  . Hypertension  . Atrial Fibrillation  . Osteoporosis   Subjective:   Patient saw McKenzie 07/04/2017 for AVW.   Annual physical exam [name] [name] is a 76 y.o. female who presents today for health maintenance and complete physical. She feels well. She reports exercising walking. She reports she is sleeping well.  ----------------------------------------------------------------    Hypertension, follow-up:  BP Readings from Last 3 Encounters:  09/10/17 110/80  08/29/17 110/68  08/25/17 (!) 157/88    She was last seen for hypertension 6 months ago.  BP at that visit was 104/60. Management since that visit includes; patient was advised she may reduce metoprolol to 1 tablet in the morning and continue diltiazem in the evening.She reports good compliance with treatment. She is not having side effects. none She is exercising. She is adherent to low salt diet.   Outside blood pressures are not checking. She is experiencing none.  Patient denies none.   Cardiovascular risk factors include none.  Use of agents associated with hypertension: none.   ----------------------------------------------------------------   Chronic bilateral low back pain without sciatica From 03/14/2017-discontinued Oxycodone and started HYDROcodone-acetaminophen (NORCO/VICODIN) 5-325 MG tablet. She is not candidate for NSAIDs since she is taking Xarelto for a-fib. She states oxycodone helps more that hydrocodone and tramadol, but still not controlling pain well ans is limiting her ADLs.  MRI 06/07/2016  IMPRESSION: 1. Lumbar spondylosis, scoliosis, and degenerative disc disease, resulting in moderate impingement at L5-S1 and L3-4; mild to moderate impingement at L4-5 ; and mild  impingement at L1- 2 and L2- 3, as detailed above. 2. Left facet effusion at L5-S1.   Osteoporosis, unspecified osteoporosis type, unspecified pathological fracture presence From 08/02/2016-ordered Bone Density, started Alendronate. States she is taking it consistently without adverse effects.   Chronic atrial fibrillation (HCC) From 05/11/2017-no changes.   No dyspnea, chest pains, or dizziness.      Review of Systems  Constitutional: Positive for fatigue.  Musculoskeletal: Positive for arthralgias, back pain and myalgias.  All other systems reviewed and are negative.   Social History      She  reports that she quit smoking about 45 years ago. she has never used smokeless tobacco. She reports that she does not drink alcohol or use drugs.       Social History   Socioeconomic History  . Marital status: Divorced    Spouse name: None  . Number of children: 2  . Years of education: None  . Highest education level: None  Social Needs  . Financial resource strain: None  . Food insecurity - worry: None  . Food insecurity - inability: None  . Transportation needs - medical: None  . Transportation needs - non-medical: None  Occupational History  . Occupation: Retired  Tobacco Use  . Smoking status: Former Smoker    Last attempt to quit: 04/11/1972    Years since quitting: 45.4  . Smokeless tobacco: Never Used  Substance and Sexual Activity  . Alcohol use: No    Alcohol/week: 0.0 oz  . Drug use: No  . Sexual activity: None  Other Topics Concern  . None  Social History Narrative   Retired, divorced, gets regular exercise ([employer]- 3x weekly; walks dog)    Past Medical History:  Diagnosis Date  . Arthritis    osteo; in B knees;   . Bladder disorder   . Chicken pox   . Chronic a-fib ([place])   . DVT (deep venous thrombosis) ([place])    H/O  . Dyspnea   . Dysrhythmia    a fib  . Edema   . Hay fever   . Heart murmur   . Hypertension    controlled  . Irregular heart  beat   . Seizure ([place])    2 years ago knocked unconscious; hospitalized and diagnosed with seizures; no spells in last year;   . Stroke [facility])    1 month ago (possible stroke)  . Unspecified atrial fibrillation [facility])      Patient Active Problem List   Diagnosis Date Noted  . Osteoporosis 08/02/2016  . History of colon polyps 08/02/2016  . Low back pain 06/20/2016  . S/P total knee arthroplasty 03/01/2016  . Knee osteoarthritis 02/16/2016  . Weakness 09/27/2015  . 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 History of CVA (cerebrovascular accident) 02/16/2014  . Seizures ([place]) 02/16/2014  . Chronic leg pain 02/16/2014  . Fatigue 07/02/2013  . HYPERTENSION, BENIGN 12/07/2010  . ATRIAL FIBRILLATION 06/29/2009    Past Surgical History:  Procedure Laterality Date  . ablation  [place], [place]  . ABLATION     for irregular heart beat  . bilateral wrist surgery  2003  . BREAST BIOPSY Left    stereo  . BREAST SURGERY    . CATARACT EXTRACTION PHACO AND INTRAOCULAR LENS PLACEMENT ([place]) Left 06/22/2015   Performed by Birder Robson, MD at [facility] ORS  . CATARACT EXTRACTION PHACO AND INTRAOCULAR LENS PLACEMENT ([place]) Right 06/01/2015   Performed by Birder Robson, MD at [facility] ORS  . COLONOSCOPY    . COMPUTER ASSISTED TOTAL KNEE ARTHROPLASTY Left 04/23/2017   Performed by Dereck Leep, MD at [facility] ORS  . COMPUTER ASSISTED TOTAL KNEE ARTHROPLASTY Right 03/01/2016   Performed by Dereck Leep, MD at [facility] ORS  . DILATION AND CURETTAGE OF UTERUS    . JOINT REPLACEMENT    . TONSILLECTOMY    . TOTAL KNEE ARTHROPLASTY Right     Family History        Family Status  Relation Name Status  . Father  Deceased at age 74  . Mother  Deceased at age 55       old age  . Sister  Alive  . MGM  Deceased  . MGF  Deceased       OLD AGE  . PGM  Deceased  . PGF  Deceased       complications with Dementia        Her family history includes Arthritis in her paternal  grandmother; Atrial fibrillation in her mother; Congestive Heart Failure in her mother; Diabetes in her paternal grandfather; Melanoma in her mother; Stroke in her maternal grandmother and mother.     Allergies  Allergen Reactions  . Aspirin Other (See Comments)    On xarelto     Current Outpatient Medications:  .  acetaminophen (TYLENOL) 650 MG CR tablet, Take 650 mg by mouth every 8 (eight) hours as needed for pain., Disp: , Rfl:  .  Artificial Tear Solution (SOOTHE XP XTRA PROTECTION OP), Place 1 drop into both eyes 2 (two) times daily as needed., Disp: , Rfl:  .  B Complex-Biotin-FA (EQL B COMPLEX 100) TABS, Take 0.4 mg tablet  once daily by mouth, Disp: , Rfl:  .  Calcium Citrate-Vitamin D 200-250 MG-UNIT TABS, Take 2 tabs by mouth each morning and 1 tab by mouth in evening., Disp: , Rfl:  .  cholecalciferol (VITAMIN D) 1000 units tablet, Take 1,000 Units by mouth daily., Disp: , Rfl:  .  Coenzyme Q10 (CO Q-10) 100 MG CAPS, Take 100 mg by mouth daily. , Disp: , Rfl:  .  diltiazem (CARDIZEM CD) 240 MG 24 hr capsule, Take 240 mg by mouth daily., Disp: , Rfl:  .  gabapentin (NEURONTIN) 100 MG capsule, Take 2 capsules (200 mg total) by mouth 2 (two) times daily., Disp: 360 capsule, Rfl: 0 .  Lactobacillus (ACIDOPHILUS PO), Take 1 tablet by mouth at bedtime. , Disp: , Rfl:  .  lidocaine (ASPERCREME W/LIDOCAINE) 4 % cream, Apply 1 application topically daily as needed. Apply to affected area for pain, Disp: , Rfl:  .  metoprolol tartrate (LOPRESSOR) 25 MG tablet, Take 25 mg by mouth daily. , Disp: , Rfl:  .  oxybutynin (DITROPAN) 5 MG tablet, Take 1 tablet (5 mg total) by mouth 2 (two) times daily., Disp: 180 tablet, Rfl: 3 .  traMADol (ULTRAM) 50 MG tablet, Take 1-2 tablets (50-100 mg total) by mouth every 4 (four) hours as needed for moderate pain., Disp: 60 tablet, Rfl: 0 .  XARELTO 20 MG TABS tablet, TAKE ONE TABLEY BY MOUTH DAILY WITH SUPPER, Disp: 90 tablet, Rfl: 4 .   HYDROcodone-acetaminophen (NORCO/VICODIN) 5-325 MG tablet, Take 1 tablet by mouth every 6 (six) hours as needed for moderate pain., Disp: , Rfl:  .  oxyCODONE (OXY IR/ROXICODONE) 5 MG immediate release tablet, Take 1-2 tablets (5-10 mg total) by mouth every 6 (six) hours as needed for severe pain or breakthrough pain. (Patient not taking: Reported on 09/10/2017), Disp: 30 tablet, Rfl: 0   Alendronate 70mg  1 (one) tablet weekly  Patient Care Team: Birdie Sons, MD as PCP - General (Family Medicine) Vladimir Crofts, MD (Neurology) Minna Merritts, MD as Consulting Physician (Cardiology) Sharlet Salina, MD as Referring Physician (Physical Medicine and Rehabilitation) Deetta Perla, MD as Consulting Physician (Neurosurgery) Birder Robson, MD as Referring Physician (Ophthalmology)      Objective:   Vitals: BP 110/80 (BP Location: Right Arm, Patient Position: Sitting, Cuff Size: Normal)   Pulse 64   Temp 97.7 F (36.5 C) (Oral)   Resp 16   Ht 5\' 1"  (1.549 m)   Wt 167 lb (75.8 kg)   SpO2 99%   BMI 31.55 kg/m    Vitals:   09/10/17 1010  BP: 110/80  Pulse: 64  Resp: 16  Temp: 97.7 F (36.5 C)  TempSrc: Oral  SpO2: 99%  Weight: 167 lb (75.8 kg)  Height: 5\' 1"  (1.549 m)     Physical Exam   General Appearance:    Alert, cooperative, no distress, appears stated age  Head:    Normocephalic, without obvious abnormality, atraumatic  Eyes:    PERRL, conjunctiva/corneas clear, EOM's intact, fundi    benign, both eyes  Ears:    Normal TM's and external ear canals, both ears  Nose:   Nares normal, septum midline, mucosa normal, no drainage    or sinus tenderness  Throat:   Lips, mucosa, and tongue normal; teeth and gums normal  Neck:   Supple, symmetrical, trachea midline, no adenopathy;    thyroid:  no enlargement/tenderness/nodules; no carotid   bruit or JVD  Back:     Symmetric, no  curvature, ROM normal, no CVA tenderness  Lungs:     Clear to auscultation bilaterally,  respirations unlabored  Chest Wall:    No tenderness or deformity   Heart:     Irregularly irregular rhythm. Normal rate I/VI systolic murmur, no rub  or gallop  Breast Exam:    normal appearance, no masses or tenderness  Abdomen:     Soft, non-tender, bowel sounds active all four quadrants,    no masses, no organomegaly  Pelvic:    deferred  Extremities:   Extremities normal, atraumatic, no cyanosis or edema  Pulses:   2+ and symmetric all extremities  Skin:   Skin color, texture, turgor normal, no rashes or lesions  Lymph nodes:   Cervical, supraclavicular, and axillary nodes normal  Neurologic:   CNII-XII intact, normal strength, sensation and reflexes    throughout      Depression Screen PHQ 2/9 Scores 07/04/2017 03/01/2017 08/02/2016  PHQ - 2 Score 0 0 0  PHQ- 9 Score - - 3      Assessment & Plan:     Routine Health Maintenance and Physical Exam  Exercise Activities and Dietary recommendations Goals    None      Immunization History  Administered Date(s) Administered  . Influenza Split 07/14/2009, 08/02/2010  . Influenza, High Dose Seasonal PF 07/24/2014, 09/22/2015, 08/02/2016, 07/04/2017  . Influenza,inj,Quad PF,6+ Mos 07/04/2013  . PPD Test 03/04/2016  . Pneumococcal Conjugate-13 07/24/2014  . Pneumococcal Polysaccharide-23 07/14/2009  . Td 06/16/2008  . Tdap 01/05/2011  . Zoster 11/29/2011    Health Maintenance  Topic Date Due  . COLONOSCOPY  01/16/2017  . TETANUS/TDAP  01/04/2021  . INFLUENZA VACCINE  Completed  . DEXA SCAN  Completed  . PNA vac Low Risk Adult  Completed     Discussed health benefits of physical activity, and encouraged her to engage in regular exercise appropriate for her age and condition.    --------------------------------------------------------------------  1. Annual physical exam   2. HYPERTENSION, BENIGN Fairly well controlled. Continue current medications.    3. Chronic bilateral low back pain without sciatica Can  change back to prn oxycodone while awaiting referral to pain clnic.   4. BMI 31.0-31.9,adult Encourage increasing physical activity.   5. Chronic atrial fibrillation (HCC) Rate controlled, on NOAC  6. Osteoporosis. Doing well with Alendronate started this year.    Lelon Huh, MD  [facility]

## 2017-10-09 ENCOUNTER — Encounter: Payer: Self-pay | Admitting: Student in an Organized Health Care Education/Training Program

## 2017-10-09 ENCOUNTER — Ambulatory Visit
Payer: PPO | Attending: Student in an Organized Health Care Education/Training Program | Admitting: Student in an Organized Health Care Education/Training Program

## 2017-10-09 ENCOUNTER — Other Ambulatory Visit: Payer: Self-pay

## 2017-10-09 VITALS — BP 128/50 | HR 73 | Temp 98.4°F | Resp 16 | Ht 61.0 in | Wt 165.0 lb

## 2017-10-09 DIAGNOSIS — Z7901 Long term (current) use of anticoagulants: Secondary | ICD-10-CM | POA: Insufficient documentation

## 2017-10-09 DIAGNOSIS — I4891 Unspecified atrial fibrillation: Secondary | ICD-10-CM | POA: Diagnosis not present

## 2017-10-09 DIAGNOSIS — G894 Chronic pain syndrome: Secondary | ICD-10-CM | POA: Diagnosis not present

## 2017-10-09 DIAGNOSIS — I10 Essential (primary) hypertension: Secondary | ICD-10-CM | POA: Diagnosis not present

## 2017-10-09 DIAGNOSIS — M47816 Spondylosis without myelopathy or radiculopathy, lumbar region: Secondary | ICD-10-CM | POA: Diagnosis not present

## 2017-10-09 DIAGNOSIS — M810 Age-related osteoporosis without current pathological fracture: Secondary | ICD-10-CM | POA: Diagnosis not present

## 2017-10-09 DIAGNOSIS — Z96653 Presence of artificial knee joint, bilateral: Secondary | ICD-10-CM | POA: Diagnosis not present

## 2017-10-09 DIAGNOSIS — M5416 Radiculopathy, lumbar region: Secondary | ICD-10-CM

## 2017-10-09 DIAGNOSIS — M5136 Other intervertebral disc degeneration, lumbar region: Secondary | ICD-10-CM | POA: Diagnosis not present

## 2017-10-09 DIAGNOSIS — Z8673 Personal history of transient ischemic attack (TIA), and cerebral infarction without residual deficits: Secondary | ICD-10-CM | POA: Insufficient documentation

## 2017-10-09 DIAGNOSIS — Z87891 Personal history of nicotine dependence: Secondary | ICD-10-CM | POA: Diagnosis not present

## 2017-10-09 MED ORDER — GABAPENTIN 300 MG PO CAPS: 300.0000 mg | ORAL_CAPSULE | Freq: Two times a day (BID) | ORAL | 1 refills | Status: DC

## 2017-10-09 NOTE — Patient Instructions (Addendum)
1. Increase Gabapentin to 300 mg twice daily 2. Follow up in 1 month. Will discuss lumbar facet blocks then.

## 2017-10-09 NOTE — Progress Notes (Signed)
Dictation #1 [id number]  [id number] Patient's Name: [name] [name]  MRN: [id number]  Referring Provider: Birdie Sons, MD  DOB: 1941/10/15  PCP: Birdie Sons, MD  DOS: 10/09/2017  Note by: Gillis Santa, MD  Service setting: Ambulatory outpatient  Specialty: Interventional Pain Management  Location: [facility] (AMB) Pain Management Facility  Visit type: Initial Patient Evaluation  Patient type: New Patient   Primary Reason(s) for Visit: Encounter for initial evaluation of one or more chronic problems (new to examiner) potentially causing chronic pain, and posing a threat to normal musculoskeletal function. (Level of risk: High) CC: Back Pain (lower)  HPI  [name] [name] is a 76 y.o. year old, female patient, who comes today to see [place] for the first time for an initial evaluation of her chronic pain. 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 Today she comes in for evaluation of her Back Pain (lower)  Pain Assessment: Location: Lower Back Radiating: right upper leg, sometimes in left leg, both hips Onset: More than a month ago Duration: Chronic pain Quality: Burning, Sharp Severity: 8 /10 (self-reported pain score)  Note: Reported level is inconsistent with clinical observations. Clinically the patient looks like a 2/10 A 2/10 is viewed as "Mild to Moderate" and described as noticeable and distracting. Impossible to hide from other people. More frequent flare-ups. Still possible to adapt and function close to normal. It can be very annoying and may have occasional stronger flare-ups. With discipline, patients may get used to it and adapt.       When using our objective Pain Scale, levels between 6 and 10/10  are said to belong in an emergency room, as it progressively worsens from a 6/10, described as severely limiting, requiring emergency care not usually available at an outpatient pain management facility. At a 6/10 level, communication becomes difficult and requires great effort. Assistance to reach the emergency department may be required. Facial flushing and profuse sweating along with potentially dangerous increases in heart rate and blood pressure will be evident. Effect on ADL:   Timing: Intermittent Modifying factors: rest  Onset and Duration: Gradual but severely worsened in May 2017 Cause of pain: Unknown, no motor vehicle accident Severity: Getting worse.  At its worst is a 9 out of 10.  At its best is a 0 out of 10.  Currently is a 2 out of 10.  Most of the time at 6 out of 10. Timing: Worse during activity or exercise, after activity exercise, after immobility. Aggravating factors: Climbing, kneeling, lifting, twisting, walking, walking up hill, walking downhill Alleviating factors: Cold packs, hot packs, medications, sitting, standing, epidural injections Associated problems: Numbness, tingling Quality: Burning, constant, intermittent, cool, deep, exhausting, horrible, sharp, stabbing, tingling, tiring Previous exams or tests: Bone scan, CT scan, MRI, nerve blocks, nerve conduction test done 8 years ago, orthopedic evaluation, chiropractic evaluation, neurological evaluation Previous treatments: Epidural steroid injections, narcotic medications, physical therapy.   The patient comes into the clinics today for the first time for a chronic pain management evaluation.  76 year old female who presents with axial low back pain and buttock pain as well as bilateral knee pain secondary to lumbar degenerative disc disease, chronic lumbar radiculopathy, lumbar spondylosis, bilateral knee osteoarthritis.  Patient states that she had knee replacement surgery in May  2017 followed by a left knee  replacement July 2018.  She states worsening back pain since her right knee replacement in May 2017.  For the knee pain, the patient has tried various medications including gabapentin, opioid medications including oxycodone and tramadol which were not very beneficial.  She has obtained  transforaminal epidural steroid injections in her lumbar spine by physiatry which have been moderately helpful.  Her last one was less than a month ago.  Patient is currently not on any opioid analgesics.  Otherwise the patient denies any chest pain, vision changes, shortness of breath, coughing, abdominal pain.  Today I took the time to provide the patient with information regarding my pain practice. The patient was informed that my practice is divided into two sections: an interventional pain management section, as well as a completely separate and distinct medication management section. I explained that I have procedure days for my interventional therapies, and evaluation days for follow-ups and medication management. Because of the amount of documentation required during both, they are kept separated. This means that there is the possibility that she may be scheduled for a procedure on one day, and medication management the next. I have also informed her that because of staffing and facility limitations, I no longer take patients for medication management only. To illustrate the reasons for this, I gave the patient the example of surgeons, and how inappropriate it would be to refer a patient to his/her care, just to write for the post-surgical antibiotics on a surgery done by a different surgeon.   Because interventional pain management is my board-certified specialty, the patient was informed that joining my practice means that they are open to any and all interventional therapies. I made it clear that this does not mean that they will be forced to have any procedures done. What this means is that I believe interventional  therapies to be essential part of the diagnosis and proper management of chronic pain conditions. Therefore, patients not interested in these interventional alternatives will be better served under the care of a different practitioner.  The patient was also made aware of my Comprehensive Pain Management Safety Guidelines where by joining my practice, they limit all of their nerve blocks and joint injections to those done by our practice, for as long as we are retained to manage their care.   Historic Controlled Substance Pharmacotherapy Review  PMP and historical list of controlled substances: Oxycodone 5 mg, quantity 30, last filled 09/10/2017.   She is not on active chronic opioid therapy. Medications: The patient did not bring the medication(s) to the appointment, as requested in our "New Patient Package" Pharmacodynamics: Desired effects: Analgesia: The patient reports >50% benefit. Reported improvement in function: The patient reports medication allows her to accomplish basic ADLs. Clinically meaningful improvement in function (CMIF): Sustained CMIF goals met Perceived effectiveness: Described as relatively effective, allowing for increase in activities of daily living (ADL) Undesirable effects: Side-effects or Adverse reactions: None reported Historical Monitoring: The patient  reports that she does not use drugs. List of all UDS Test(s): No results found for: MDMA, COCAINSCRNUR, [place], [place], CANNABQUANT, [place], [place] List of other Serum/Urine Drug Screening Test(s):  No results found for: AMPHSCRSER, BARBSCRSER, BENZOSCRSER, COCAINSCRSER, COCAINSCRNUR, PCPSCRSER, PCPQUANT, THCSCRSER, THCU, CANNABQUANT, OPIATESCRSER, OXYSCRSER, PROPOXSCRSER, ETH Historical Background Evaluation: [place] PMP: Six (6) year initial data search conducted.             PMP NARX Score Report:  [place] Department of public safety, offender search: Editor, commissioning  Information) Non-contributory Risk Assessment  Profile: Aberrant behavior: None observed or detected today Risk factors for fatal opioid overdose: None identified today Fatal overdose hazard ratio (HR): Calculation deferred Non-fatal overdose hazard ratio (HR): Calculation deferred Risk of opioid abuse or dependence: 0.7-3.0% with doses ? 36 MME/day and 6.1-26% with doses ? 120 MME/day. Substance use disorder (SUD) risk level: Low Opioid risk tool (ORT) (Total Score): 1 Opioid Risk Tool - 10/09/17 1423      Family History of Substance Abuse   Alcohol  Positive Female    Illegal Drugs  Negative    Rx Drugs  Negative      Personal History of Substance Abuse   Alcohol  Negative    Illegal Drugs  Negative    Rx Drugs  Negative      Age   Age between 38-45 years   No      History of Preadolescent Sexual Abuse   History of Preadolescent Sexual Abuse  Negative or Female      Psychological Disease   Psychological Disease  Negative    Depression  Negative      Total Score   Opioid Risk Tool Scoring  1    Opioid Risk Interpretation  Low Risk      ORT Scoring interpretation table:  Score <3 = Low Risk for SUD  Score between 4-7 = Moderate Risk for SUD  Score >8 = High Risk for Opioid Abuse     Pharmacologic Plan: None at this point.            Initial impression: No immediate contraindications found.  Meds   Current Outpatient Medications:  .  acetaminophen (TYLENOL) 650 MG CR tablet, Take 650 mg by mouth every 8 (eight) hours as needed for pain., Disp: , Rfl:  .  alendronate (FOSAMAX) 70 MG tablet, Take 1 tablet once a week by mouth., Disp: , Rfl:  .  Artificial Tear Solution (SOOTHE XP XTRA PROTECTION OP), Place 1 drop into both eyes 2 (two) times daily as needed., Disp: , Rfl:  .  B Complex-Biotin-FA (EQL B COMPLEX 100) TABS, Take 0.4 mg tablet once daily by mouth, Disp: , Rfl:  .  Calcium Citrate-Vitamin D 200-250 MG-UNIT TABS, Take 2 tabs by mouth each morning and 1 tab by mouth in evening., Disp: , Rfl:  .   cholecalciferol (VITAMIN D) 1000 units tablet, Take 1,000 Units by mouth daily., Disp: , Rfl:  .  Coenzyme Q10 (CO Q-10) 100 MG CAPS, Take 100 mg by mouth daily. , Disp: , Rfl:  .  diltiazem (CARDIZEM CD) 240 MG 24 hr capsule, Take 240 mg by mouth daily., Disp: , Rfl:  .  Lactobacillus (ACIDOPHILUS PO), Take 1 tablet by mouth at bedtime. , Disp: , Rfl:  .  lidocaine (ASPERCREME W/LIDOCAINE) 4 % cream, Apply 1 application topically daily as needed. Apply to affected area for pain, Disp: , Rfl:  .  MEGARED OMEGA-3 KRILL OIL PO, Take 300 mg by mouth., Disp: , Rfl:  .  metoprolol tartrate (LOPRESSOR) 25 MG tablet, Take 25 mg by mouth daily. , Disp: , Rfl:  .  oxybutynin (DITROPAN) 5 MG tablet, Take 1 tablet (5 mg total) by mouth 2 (two) times daily., Disp: 180 tablet, Rfl: 3 .  traMADol (ULTRAM) 50 MG tablet, Take 1-2 tablets (50-100 mg total) by mouth every 4 (four) hours as needed for moderate pain., Disp: 60 tablet, Rfl: 0 .  XARELTO 20 MG TABS tablet, TAKE ONE TABLEY BY  MOUTH DAILY WITH SUPPER, Disp: 90 tablet, Rfl: 4 .  gabapentin (NEURONTIN) 300 MG capsule, Take 1 capsule (300 mg total) by mouth 2 (two) times daily., Disp: 60 capsule, Rfl: 1 .  oxyCODONE (OXY IR/ROXICODONE) 5 MG immediate release tablet, Take 1-2 tablets (5-10 mg total) every 6 (six) hours as needed by mouth for severe pain or breakthrough pain. (Patient not taking: Reported on 10/09/2017), Disp: 30 tablet, Rfl: 0  Imaging Review    Lumbosacral Imaging: Lumbar MR wo contrast:  Results for orders placed during the hospital encounter of 06/07/16  MR Lumbar Spine Wo Contrast   Narrative CLINICAL DATA:  Low back pain with right buttock pain and weakness in both legs. Symptoms for 1 year.  EXAM: MRI LUMBAR SPINE WITHOUT CONTRAST  TECHNIQUE: Multiplanar, multisequence MR imaging of the lumbar spine was performed. No intravenous contrast was administered.  COMPARISON:  05/28/2015 CT scan  FINDINGS: Segmentation: The  lowest lumbar type non-rib-bearing vertebra is labeled as L5.  Alignment: Severe lumbar scoliosis with prominent dextroconvex scoliosis with rotary component centered at the L1 level and correctional levoconvex scoliosis centered at L4. There is 7 mm of degenerative anterolisthesis at L4-5. Due to the scoliosis, there some rightward subluxation of L 2 on L3 and especially L3 on L4 as shown for example on image 8/10.  Vertebrae: Mild type 1 degenerative endplate findings at U6-3. Left facet effusion at L5-S1 with prominent facet arthropathy on the right at L5-S1.  Left eccentric hemangioma in the L4 vertebral body.  Conus medullaris: Extends to the L1 level and appears normal.  Paraspinal and other soft tissues: Unremarkable  Disc levels:  L1-2: Mild left foraminal stenosis due to facet and intervertebral spurring. The nerve roots are localized to the left at this level due to the scoliosis.  L2-3: Mild left foraminal stenosis due to disc bulge and facet arthropathy.  L3-4: Moderate right foraminal stenosis and mild right subarticular lateral recess stenosis due to intervertebral spurring, right paracentral disc protrusion, and facet arthropathy.  L4-5: Mild to moderate right foraminal stenosis and mild right subarticular lateral recess stenosis due to prominent right facet arthropathy, intervertebral spurring, and disc bulge.  L5-S1: Moderate bilateral subarticular lateral recess stenosis with mild to moderate right foraminal stenosis due to right greater than left facet arthropathy and disc uncovering.  IMPRESSION: 1. Lumbar spondylosis, scoliosis, and degenerative disc disease, resulting in moderate impingement at L5-S1 and L3-4; mild to moderate impingement at L4-5 ; and mild impingement at L1- 2 and L2- 3, as detailed above. 2. Left facet effusion at L5-S1.   Electronically Signed   By: Van Clines M.D.   On: 06/07/2016 12:38     Spine O Results for  orders placed during the hospital encounter of 08/04/15  DG HIP UNILAT WITH PELVIS 2-3 VIEWS RIGHT   Narrative CLINICAL DATA:  Slipped stepping out of bathtub yesterday. Right hip pain. Difficulty bearing weight.  EXAM: DG HIP (WITH OR WITHOUT PELVIS) 2-3V RIGHT  COMPARISON:  None.  FINDINGS: Mild joint space narrowing and early spurring in the hip joints bilaterally. Degenerative changes in the visualized lower lumbar spine. SI joints are symmetric and unremarkable. No acute bony abnormality. Specifically, no fracture, subluxation, or dislocation. Soft tissues are intact.  IMPRESSION: Early osteoarthritis in the hips bilaterally. No acute bony abnormality.   Electronically Signed   By: Rolm Baptise M.D.   On: 08/04/2015 15:45     Knee-R DG 4 views:  Results for orders placed during the hospital  encounter of 08/04/15  DG Knee Complete 4 Views Right   Narrative CLINICAL DATA:  Acute right knee pain after falling out of bath tub yesterday. Initial encounter.  EXAM: RIGHT KNEE - COMPLETE 4+ VIEW  COMPARISON:  None.  FINDINGS: There is no evidence of fracture, dislocation, or joint effusion. Moderate narrowing of patellofemoral space is noted with osteophyte formation. Moderate narrowing of medial joint space is noted with osteophyte formation. Soft tissues are unremarkable.  IMPRESSION: Moderate degenerative joint disease is noted. No acute abnormality seen in the right knee.   Electronically Signed   By: Marijo Conception, M.D.   On: 08/04/2015 15:41      Complexity Note: Imaging results reviewed. Results shared with Ms. Kirchoff, using Layman's terms.                         ROS  Cardiovascular history: Abnormal heart rhythm, high blood pressure.  Negative for chest pain, heart trouble.  Positive for heart murmur.  On blood thinners: Xarelto for atrial fibrillation Pulmonary history: Shortness of breath with exertion, snoring Neurological history:  Stroke, scoliosis, numbness tingling Psychological history: Depression GI history: Negative for ulcers, irritable bowel syndrome, hepatitis GU history: Negative for blood in urine, pain with urination Hematological history: Bruises easily, easy bleeder Endocrine history: No increased thirst, temperature changes, heat or cold sensitivity Rheumatologic history: Osteoarthritis, joint pain Musculoskeletal history: Joint aches, stiffness, limited range of motion particularly her knees. Review of Past Neurological Studies:  Results for orders placed or performed during the hospital encounter of 08/24/17  CT Head Wo Contrast   Narrative   CLINICAL DATA:  Patient fell hitting head this evening. Patient on Xarelto.  EXAM: CT HEAD WITHOUT CONTRAST  TECHNIQUE: Contiguous axial images were obtained from the base of the skull through the vertex without intravenous contrast.  COMPARISON:  None.  FINDINGS: Brain: Superficial atrophy with chronic mild-to-moderate small vessel ischemia. No acute intracranial hemorrhage, midline shift or edema. No intra-axial mass nor extra-axial fluid collections.  Vascular: Moderate atherosclerosis of the carotid siphons. No hyperdense vessels. No unexpected calcifications.  Skull: Negative for fracture or focal osseous lesions.  Sinuses/Orbits: No acute finding.  Other: None  IMPRESSION: Atrophy with chronic small vessel ischemic disease. No acute intracranial abnormality.   Electronically Signed   By: Ashley Royalty M.D.   On: 08/24/2017 22:19   Results for orders placed or performed during the hospital encounter of 10/28/15  MR Brain W Wo Contrast   Narrative   CLINICAL DATA:  Generalized weakness. Fall 1 month ago. Abnormal gait. Left leg weakness. Weakness in the left hand.  EXAM: MRI HEAD WITHOUT AND WITH CONTRAST  TECHNIQUE: Multiplanar, multiecho pulse sequences of the brain and surrounding structures were obtained without and with  intravenous contrast.  CONTRAST:  31m MULTIHANCE GADOBENATE DIMEGLUMINE 529 MG/ML IV SOLN  COMPARISON:  CT head without contrast 08/24/2013.  FINDINGS: The diffusion weighted images demonstrate focal signal in the right frontal operculum. These areas are isointense on the ADC map. No focal restricted diffusion is evident.  There is T2 signal in the same areas compatible with T2 shine through.  Asymmetric remote lacunar infarcts are present in the right basal ganglia. There is moderate periventricular and subcortical white matter disease bilaterally. Moderate generalized atrophy is noted. The  The ventricles are of normal size. No significant extra-axial fluid collections are present.  Flow is present in the major intracranial arteries. Bilateral lens replacements are noted. The globes  and orbits are otherwise intact. The right maxillary sinus is shrunken. The paranasal sinuses and mastoid air cells are otherwise clear.  Skullbase is normal.  Midline sagittal images are unremarkable.  The postcontrast images demonstrate focal cortical enhancement in the right frontal operculum corresponding to the areas of diffusion signal abnormality. This also suggests subacute/early chronic infarcts. There is additional enhancement in the right lentiform nucleus.  IMPRESSION: 1. Subacute/early chronic infarcts involving the right frontal operculum. These correspond with the 1 month timeframe. 2. No acute cortical infarct within a 14 day timeframe. 3. Late subacute to early chronic infarct involving the right basal ganglia. This is potentially at the same time is the cortical infarcts. 4. Moderate atrophy and white matter disease otherwise likely reflects the sequela of chronic microvascular ischemia.   Electronically Signed   By: San Morelle M.D.   On: 10/28/2015 14:40    Work History: Retired  Allergies  Ms. Ovando is allergic to aspirin.  Laboratory Chemistry   Inflammation Markers (CRP: Acute Phase) (ESR: Chronic Phase) Lab Results  Component Value Date   CRP <0.8 04/11/2017   ESRSEDRATE 8 04/11/2017                 Rheumatology Markers No results found for: Elayne Guerin, [facility]              Renal Function Markers Lab Results  Component Value Date   BUN 15 04/25/2017   CREATININE 0.59 04/25/2017   GFRAA >60 04/25/2017   GFRNONAA >60 04/25/2017                 Hepatic Function Markers Lab Results  Component Value Date   AST 26 04/11/2017   ALT 22 04/11/2017   ALBUMIN 4.5 04/11/2017   ALKPHOS 40 04/11/2017                 Electrolytes Lab Results  Component Value Date   NA 137 04/25/2017   K 4.0 04/25/2017   CL 104 04/25/2017   CALCIUM 8.7 (L) 04/25/2017   MG 2.4 (H) 09/27/2015   PHOS 3.6 12/29/2015                 Neuropathy Markers Lab Results  Component Value Date   HGBA1C 5.4 08/25/2013                 Bone Pathology Markers Lab Results  Component Value Date   VD25OH 39.0 08/02/2016                 Coagulation Parameters Lab Results  Component Value Date   INR 1.4 05/11/2017   LABPROT 22.8 (H) 04/23/2017   APTT 34 04/11/2017   PLT 150 04/25/2017                 Cardiovascular Markers Lab Results  Component Value Date   CKTOTAL 95 09/27/2015   CKMB 1.7 08/24/2013   TROPONINI <0.01 09/27/2015   HGB 11.5 (L) 04/25/2017   HCT 34.1 (L) 04/25/2017                 CA Markers No results found for: CEA, CA125, LABCA2               Note: Lab results reviewed.  [place]  Drug: Ms. Nase  reports that she does not use drugs. Alcohol:  reports that she does not drink alcohol. Tobacco:  reports that she quit smoking about 45 years ago. she has never used  smokeless tobacco. Medical:  has a past medical history of Arthritis, Bladder disorder, Chicken pox, Chronic a-fib ([place]), DVT (deep venous thrombosis) ([place]), Dyspnea, Dysrhythmia, Edema, Hay fever, Heart murmur,  Hypertension, Irregular heart beat, Seizure ([place]), Stroke ([place]), and Unspecified atrial fibrillation ([place]). Family: family history includes Arthritis in her paternal grandmother; Atrial fibrillation in her mother; Congestive Heart Failure in her mother; Diabetes in her paternal grandfather; Melanoma in her mother; Stroke in her maternal grandmother and mother.  Past Surgical History:  Procedure Laterality Date  . ablation  [place], [place]  . ABLATION     for irregular heart beat  . bilateral wrist surgery  2003  . BREAST BIOPSY Left    stereo  . BREAST SURGERY    . CATARACT EXTRACTION W/PHACO Right 06/01/2015   Procedure: CATARACT EXTRACTION PHACO AND INTRAOCULAR LENS PLACEMENT (IOC);  Surgeon: Birder Robson, MD;  Location: [facility] ORS;  Service: Ophthalmology;  Laterality: Right;  [place]: 01:02.0 AP%: 23.1 CDE: 14.33 Fluid lot# [id number]   . CATARACT EXTRACTION W/PHACO Left 06/22/2015   Procedure: CATARACT EXTRACTION PHACO AND INTRAOCULAR LENS PLACEMENT (IOC);  Surgeon: Birder Robson, MD;  Location: [facility] ORS;  Service: Ophthalmology;  Laterality: Left;  [place]: 01:00.7 AP%: 23.1 CDE: 14.01 Lot # [id number]  . COLONOSCOPY    . DILATION AND CURETTAGE OF UTERUS    . JOINT REPLACEMENT    . KNEE ARTHROPLASTY Right 03/01/2016   Procedure: COMPUTER ASSISTED TOTAL KNEE ARTHROPLASTY;  Surgeon: Dereck Leep, MD;  Location: [facility] ORS;  Service: Orthopedics;  Laterality: Right;  . KNEE ARTHROPLASTY Left 04/23/2017   Procedure: COMPUTER ASSISTED TOTAL KNEE ARTHROPLASTY;  Surgeon: Dereck Leep, MD;  Location: [facility] ORS;  Service: Orthopedics;  Laterality: Left;  . TONSILLECTOMY    . TOTAL KNEE ARTHROPLASTY Right    Active Ambulatory Problems    Diagnosis Date Noted  . ATRIAL FIBRILLATION 06/29/2009  . HYPERTENSION, BENIGN 12/07/2010  . Fatigue 07/02/2013  . History of CVA (cerebrovascular accident) 02/16/2014  . Seizures ([place]) 02/16/2014  . Chronic leg pain 02/16/2014  . Balloon like  swelling of an artery of the brain 02/12/2015  . Weakness 09/27/2015  . Knee osteoarthritis 02/16/2016  . S/P total knee arthroplasty 03/01/2016  . Low back pain 06/20/2016  . Osteoporosis 08/02/2016  . History of colon polyps 08/02/2016   Resolved Ambulatory Problems    Diagnosis Date Noted  . Shortness of breath 06/29/2009   Past Medical History:  Diagnosis Date  . Arthritis   . Bladder disorder   . Chicken pox   . Chronic a-fib ([place])   . DVT (deep venous thrombosis) ([place])   . Dyspnea   . Dysrhythmia   . Edema   . Hay fever   . Heart murmur   . Hypertension   . Irregular heart beat   . Seizure ([place])   . Stroke ([place])   . Unspecified atrial fibrillation ([place])    Constitutional Exam  General appearance: Well nourished, well developed, and well hydrated. In no apparent acute distress Vitals:   10/09/17 1413  BP: (!) 128/50  Pulse: 73  Resp: 16  Temp: 98.4 F (36.9 C)  TempSrc: Oral  SpO2: 98%  Weight: 165 lb (74.8 kg)  Height: 5' 1" (1.549 m)   BMI Assessment: Estimated body mass index is 31.18 kg/m as calculated from the following:   Height as of this encounter: 5' 1" (1.549 m).   Weight as of this encounter: 165 lb (74.8 kg).  BMI interpretation  table: BMI level Category Range association with higher incidence of chronic pain  <18 kg/m2 Underweight   18.5-24.9 kg/m2 Ideal body weight   25-29.9 kg/m2 Overweight Increased incidence by 20%  30-34.9 kg/m2 Obese (Class I) Increased incidence by 68%  35-39.9 kg/m2 Severe obesity (Class II) Increased incidence by 136%  >40 kg/m2 Extreme obesity (Class III) Increased incidence by 254%   BMI Readings from Last 4 Encounters:  10/09/17 31.18 kg/m  09/10/17 31.55 kg/m  08/29/17 32.31 kg/m  07/04/17 31.52 kg/m   Wt Readings from Last 4 Encounters:  10/09/17 165 lb (74.8 kg)  09/10/17 167 lb (75.8 kg)  08/29/17 171 lb (77.6 kg)  07/04/17 166 lb 12.8 oz (75.7 kg)  Psych/Mental status: Alert, oriented x 3  (person, place, & time)       Eyes: PERLA Respiratory: No evidence of acute respiratory distress  Cervical Spine Area Exam  Skin & Axial Inspection: No masses, redness, edema, swelling, or associated skin lesions Alignment: Symmetrical Functional ROM: Unrestricted ROM      Stability: No instability detected Muscle Tone/Strength: Functionally intact. No obvious neuro-muscular anomalies detected. Sensory (Neurological): Unimpaired Palpation: No palpable anomalies              Upper Extremity (UE) Exam    Side: Right upper extremity  Side: Left upper extremity  Skin & Extremity Inspection: Skin color, temperature, and hair growth are WNL. No peripheral edema or cyanosis. No masses, redness, swelling, asymmetry, or associated skin lesions. No contractures.  Skin & Extremity Inspection: Skin color, temperature, and hair growth are WNL. No peripheral edema or cyanosis. No masses, redness, swelling, asymmetry, or associated skin lesions. No contractures.  Functional ROM: Unrestricted ROM          Functional ROM: Unrestricted ROM          Muscle Tone/Strength: Functionally intact. No obvious neuro-muscular anomalies detected.  Muscle Tone/Strength: Functionally intact. No obvious neuro-muscular anomalies detected.  Sensory (Neurological): Unimpaired          Sensory (Neurological): Unimpaired          Palpation: No palpable anomalies              Palpation: No palpable anomalies              Specialized Test(s): Deferred         Specialized Test(s): Deferred          Thoracic Spine Area Exam  Skin & Axial Inspection: No masses, redness, or swelling Alignment: Symmetrical Functional ROM: Unrestricted ROM Stability: No instability detected Muscle Tone/Strength: Functionally intact. No obvious neuro-muscular anomalies detected. Sensory (Neurological): Unimpaired Muscle strength & Tone: No palpable anomalies  Lumbar Spine Area Exam  Skin & Axial Inspection: No masses, redness, or  swelling Alignment: Symmetrical Functional ROM: Unrestricted ROM      Stability: No instability detected Muscle Tone/Strength: Functionally intact. No obvious neuro-muscular anomalies detected. Sensory (Neurological): Unimpaired Palpation: No palpable anomalies       Provocative Tests: Lumbar Hyperextension and rotation test: Positive bilaterally for facet joint pain. Lumbar Lateral bending test: Positive ipsilateral radicular pain, bilaterally. Positive for bilateral foraminal stenosis. Patrick's Maneuver: Positive                    Gait & Posture Assessment  Ambulation: Unassisted Gait: Relatively normal for age and body habitus Posture: WNL   Lower Extremity Exam    Side: Right lower extremity  Side: Left lower extremity  Skin & Extremity Inspection: Evidence of  prior arthroplastic surgery  Skin & Extremity Inspection: Evidence of prior arthroplastic surgery  Functional ROM: Unrestricted ROM          Functional ROM: Unrestricted ROM          Muscle Tone/Strength: Functionally intact. No obvious neuro-muscular anomalies detected.  Muscle Tone/Strength: Functionally intact. No obvious neuro-muscular anomalies detected.  Sensory (Neurological): Unimpaired  Sensory (Neurological): Unimpaired  Palpation: Tender  Palpation: Tender  Bilateral knees tender to palpation  Assessment  Primary Diagnosis & Pertinent Problem List: The primary encounter diagnosis was Lumbar spondylosis. Diagnoses of Lumbar degenerative disc disease, Lumbar radiculopathy, Chronic pain syndrome, and Status post total bilateral knee replacement were also pertinent to this visit.  Visit Diagnosis (New problems to examiner): 1. Lumbar spondylosis   2. Lumbar degenerative disc disease   3. Lumbar radiculopathy   4. Chronic pain syndrome   5. Status post total bilateral knee replacement    General Recommendations: The pain condition that the patient suffers from is best treated with a multidisciplinary approach  that involves an increase in physical activity to prevent de-conditioning and worsening of the pain cycle, as well as psychological counseling (formal and/or informal) to address the co-morbid psychological affects of pain. Treatment will often involve judicious use of pain medications and interventional procedures to decrease the pain, allowing the patient to participate in the physical activity that will ultimately produce long-lasting pain reductions. The goal of the multidisciplinary approach is to return the patient to a higher level of overall function and to restore their ability to perform activities of daily living.   Very pleasant 76 year old female who presents with chronic pain of her axial lumbar spine, bilateral buttocks, secondary to lumbar spondylosis, lumbar degenerative disc disease, chronic lumbar radiculopathy and knee pain secondary to bilateral knee osteoarthritis status post right and left knee replacement surgery (right May 2017, left July 2018).  Patient has obtained moderate benefit from transforaminal lumbar epidural steroid injections performed by Dr. Sharlet Salina, last of which was on August 23, 2017.  Patient continues to endorse axial low back pain that radiates along her sides in her lumbosacral region and down to her thighs.  Patient's lumbar MRI also shows lumbar spondylosis and she does have pain with facet loading and lateral rotation.  We discussed the potential risks and benefits of diagnostic lumbar medial branch nerve blocks with goal radiofrequency ablation if effective.  Patient is interested but wants to optimize medication management prior to proceeding with this.  I believe this is reasonable.  Furthermore the patient is anticoagulated on Xarelto for her atrial fibrillation.  She will need to be off this medication for 3 days prior to her scheduled procedure whenever we do proceed.  In the interim, the patient is not necessarily interested in opioid therapy at this time.   She is currently on gabapentin 200 mg twice daily.  This was increased from 100 mg twice daily in the last 3 months.  She does not endorse any side effects from this medication.  I will have her increase her dose to 300 mg twice daily.  Plan: -Increase gabapentin to 300 mg twice daily -Can further discuss lumbar medial branch nerve blocks and lumbar medial branch radiofrequency ablation at next visit if increase in gabapentin is not helping with her symptoms.  Patient must be off Xarelto for 3 days prior to scheduled procedure and must have cardiac clearance. -Continue Tylenol as needed.  Not to exceed 2 g daily.  Pharmacotherapy (current): Medications ordered:  Meds ordered  this encounter  Medications  . gabapentin (NEURONTIN) 300 MG capsule    Sig: Take 1 capsule (300 mg total) by mouth 2 (two) times daily.    Dispense:  60 capsule    Refill:  1    Do not place this medication, or any other prescription from our practice, on "Automatic Refill". Patient may have prescription filled one day early if pharmacy is closed on scheduled refill date.   Medications administered during this visit: Akeelah J. Knebel had no medications administered during this visit.    Provider-requested follow-up: Return in about 4 weeks (around 11/06/2017) for Medication Management.  Future Appointments  Date Time Provider [place]  11/05/2017  1:45 PM Gillis Santa, MD [facility] None    Primary Care Physician: Birdie Sons, MD Location: [facility] Note by: Gillis Santa, M.D, Date: 10/09/2017; Time: 5:26 PM  Patient Instructions  1. Increase Gabapentin to 300 mg twice daily 2. Follow up in 1 month. Will discuss lumbar facet blocks then.

## 2017-10-09 NOTE — Progress Notes (Signed)
Safety precautions to be maintained throughout the outpatient stay will include: orient to surroundings, keep bed in low position, maintain call bell within reach at all times, provide assistance with transfer out of bed and ambulation.  

## 2017-10-30 DIAGNOSIS — Z96652 Presence of left artificial knee joint: Secondary | ICD-10-CM | POA: Diagnosis not present

## 2017-11-05 ENCOUNTER — Ambulatory Visit
Payer: PPO | Attending: Student in an Organized Health Care Education/Training Program | Admitting: Student in an Organized Health Care Education/Training Program

## 2017-11-05 ENCOUNTER — Encounter: Payer: Self-pay | Admitting: Student in an Organized Health Care Education/Training Program

## 2017-11-05 VITALS — BP 125/51 | HR 69 | Temp 98.6°F | Resp 16 | Ht 61.0 in | Wt 165.0 lb

## 2017-11-05 DIAGNOSIS — M5136 Other intervertebral disc degeneration, lumbar region: Secondary | ICD-10-CM

## 2017-11-05 DIAGNOSIS — M19011 Primary osteoarthritis, right shoulder: Secondary | ICD-10-CM | POA: Diagnosis not present

## 2017-11-05 DIAGNOSIS — R569 Unspecified convulsions: Secondary | ICD-10-CM | POA: Diagnosis not present

## 2017-11-05 DIAGNOSIS — Z7901 Long term (current) use of anticoagulants: Secondary | ICD-10-CM | POA: Diagnosis not present

## 2017-11-05 DIAGNOSIS — M47816 Spondylosis without myelopathy or radiculopathy, lumbar region: Secondary | ICD-10-CM

## 2017-11-05 DIAGNOSIS — Z79899 Other long term (current) drug therapy: Secondary | ICD-10-CM | POA: Diagnosis not present

## 2017-11-05 DIAGNOSIS — I700 Atherosclerosis of aorta: Secondary | ICD-10-CM | POA: Insufficient documentation

## 2017-11-05 DIAGNOSIS — I10 Essential (primary) hypertension: Secondary | ICD-10-CM | POA: Insufficient documentation

## 2017-11-05 DIAGNOSIS — Z96653 Presence of artificial knee joint, bilateral: Secondary | ICD-10-CM | POA: Diagnosis not present

## 2017-11-05 DIAGNOSIS — I4891 Unspecified atrial fibrillation: Secondary | ICD-10-CM | POA: Diagnosis not present

## 2017-11-05 DIAGNOSIS — M170 Bilateral primary osteoarthritis of knee: Secondary | ICD-10-CM | POA: Insufficient documentation

## 2017-11-05 DIAGNOSIS — M4185 Other forms of scoliosis, thoracolumbar region: Secondary | ICD-10-CM | POA: Diagnosis not present

## 2017-11-05 DIAGNOSIS — G894 Chronic pain syndrome: Secondary | ICD-10-CM | POA: Diagnosis not present

## 2017-11-05 DIAGNOSIS — Z87891 Personal history of nicotine dependence: Secondary | ICD-10-CM | POA: Insufficient documentation

## 2017-11-05 DIAGNOSIS — M4726 Other spondylosis with radiculopathy, lumbar region: Secondary | ICD-10-CM | POA: Insufficient documentation

## 2017-11-05 DIAGNOSIS — Z8673 Personal history of transient ischemic attack (TIA), and cerebral infarction without residual deficits: Secondary | ICD-10-CM | POA: Insufficient documentation

## 2017-11-05 DIAGNOSIS — R0781 Pleurodynia: Secondary | ICD-10-CM | POA: Diagnosis not present

## 2017-11-05 DIAGNOSIS — M5416 Radiculopathy, lumbar region: Secondary | ICD-10-CM

## 2017-11-05 DIAGNOSIS — M5116 Intervertebral disc disorders with radiculopathy, lumbar region: Secondary | ICD-10-CM | POA: Insufficient documentation

## 2017-11-05 DIAGNOSIS — Z86718 Personal history of other venous thrombosis and embolism: Secondary | ICD-10-CM | POA: Insufficient documentation

## 2017-11-05 DIAGNOSIS — M25551 Pain in right hip: Secondary | ICD-10-CM | POA: Diagnosis present

## 2017-11-05 MED ORDER — DULOXETINE HCL 30 MG PO CPEP: 30.0000 mg | ORAL_CAPSULE | Freq: Every day | ORAL | 1 refills | Status: DC

## 2017-11-05 NOTE — Progress Notes (Signed)
Safety precautions to be maintained throughout the outpatient stay will include: orient to surroundings, keep bed in low position, maintain call bell within reach at all times, provide assistance with transfer out of bed and ambulation.  

## 2017-11-05 NOTE — Patient Instructions (Signed)
1. Will obtain cardiac clearance so that we can consider doing lumbar epidural if medications are not effective 2. Stop Gabapentin, start Cymbalta 30 mg daily

## 2017-11-05 NOTE — Progress Notes (Signed)
Patient's Name: [name] [name]  MRN: [id number]  Referring Provider: Birdie Sons, MD  DOB: [birth year]  PCP: Birdie Sons, MD  DOS: 11/05/2017  Note by: Gillis Santa, MD  Service setting: Ambulatory outpatient  Specialty: Interventional Pain Management  Location: [facility] (AMB) Pain Management Facility    Patient type: Established   Primary Reason(s) for Visit: Encounter for prescription drug management. (Level of risk: moderate)  CC: Hip Pain (right)  HPI  [name] [name] is a 77 y.o. year old, female patient, who comes today for a medication management evaluation. 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 Her primarily concern today is the Hip Pain (right)  Pain Assessment: Location: Right Hip Radiating: into the right leg to just below the knee  Onset: More than a month ago Duration: Chronic pain Quality: Burning, Sharp, Discomfort Severity: 8 /10 (self-reported pain score)  Note: Reported level is inconsistent with clinical observations. Clinically the patient looks like a 2/10 A 2/10 is viewed as "Mild to Moderate" and described as noticeable and distracting. Impossible to hide from other people. More frequent flare-ups. Still possible to adapt and function close to normal. It can be very annoying and may have occasional stronger flare-ups. With discipline, patients may get used to it and adapt.       When using our objective Pain Scale, levels between 6 and 10/10 are said to belong in an emergency room, as it progressively worsens from a 6/10, described as severely limiting, requiring emergency care not usually available at an outpatient pain management facility. At a 6/10 level, communication becomes  difficult and requires great effort. Assistance to reach the emergency department may be required. Facial flushing and profuse sweating along with potentially dangerous increases in heart rate and blood pressure will be evident. Effect on ADL: difficulty getting up from reclining or bed and pain is intensified when lying on her right  Timing: Intermittent Modifying factors: laying down  [name] [name] was last scheduled for an appointment on 10/09/2017 for medication management. During today's appointment we reviewed [name] [name]'s chronic pain status, as well as her outpatient medication regimen.  Patient presents today for follow-up.  At her initial visit, her gabapentin was increased to 300 mg twice daily.  She returns today stating no significant benefit after the dose increase.  She does notice side effects of changes in cognition that the patient does not like.  She also has had issues with balance associated with gabapentin increase, no falls.  Continues to endorse axial low back pain that radiates into her right leg around her knee.   The patient  reports that she does not use drugs. Her body mass index is 31.18 kg/m.  Further details on both, my assessment(s), as well as the proposed treatment plan, please see below.    Laboratory Chemistry  Inflammation Markers (CRP: Acute Phase) (ESR: Chronic Phase) Lab Results  Component Value Date   CRP <0.8 04/11/2017   ESRSEDRATE 8 04/11/2017                 Rheumatology Markers No results found for: RF, ANA, LABURIC, URICUR, LYMEIGGIGMAB, LYMEABIGMQN              Renal Function Markers Lab Results  Component Value Date   BUN  15 04/25/2017   CREATININE 0.59 04/25/2017   GFRAA >60 04/25/2017   GFRNONAA >60 04/25/2017                 Hepatic Function Markers Lab Results  Component Value Date   AST 26 04/11/2017   ALT 22 04/11/2017   ALBUMIN 4.5 04/11/2017   ALKPHOS 40 04/11/2017                 Electrolytes Lab Results   Component Value Date   NA 137 04/25/2017   K 4.0 04/25/2017   CL 104 04/25/2017   CALCIUM 8.7 (L) 04/25/2017   MG 2.4 (H) 09/27/2015   PHOS 3.6 12/29/2015                 Neuropathy Markers Lab Results  Component Value Date   HGBA1C 5.4 08/25/2013                 Bone Pathology Markers Lab Results  Component Value Date   VD25OH 39.0 08/02/2016                 Coagulation Parameters Lab Results  Component Value Date   INR 1.4 05/11/2017   LABPROT 22.8 (H) 04/23/2017   APTT 34 04/11/2017   PLT 150 04/25/2017                 Cardiovascular Markers Lab Results  Component Value Date   CKTOTAL 95 09/27/2015   CKMB 1.7 08/24/2013   TROPONINI <0.01 09/27/2015   HGB 11.5 (L) 04/25/2017   HCT 34.1 (L) 04/25/2017                 CA Markers No results found for: CEA, CA125, LABCA2               Note: Lab results reviewed.  Recent Diagnostic Imaging Results  DG Ribs Unilateral Right CLINICAL DATA:  77 year old female with right rib pain after tripping and falling on the floor in her home this past Friday.  EXAM: RIGHT RIBS - 2 VIEW  COMPARISON:  None.  FINDINGS: No fracture or other bone lesions are seen involving the ribs. Severe dextroconvex scoliosis and associated multilevel degenerative disc disease. Moderate degenerative osteoarthritis is present in the right acromioclavicular joint. Atherosclerotic calcifications present in the transverse aorta. The lungs are otherwise clear.  IMPRESSION: 1. No evidence of acute rib fracture. 2. Severe dextroconvex thoracolumbar scoliosis with associated multilevel degenerative disc disease. 3. Right AC joint osteoarthritis. 4.  Aortic Atherosclerosis (ICD10-170.0)  Electronically Signed   By: Jacqulynn Cadet M.D.   On: 08/30/2017 08:42  Complexity Note: Imaging results reviewed. Results shared with [name] [name], using Layman's terms.                         Meds   Current Outpatient Medications:  .   acetaminophen (TYLENOL) 650 MG CR tablet, Take 650 mg by mouth every 8 (eight) hours as needed for pain., Disp: , Rfl:  .  alendronate (FOSAMAX) 70 MG tablet, Take 1 tablet once a week by mouth., Disp: , Rfl:  .  Artificial Tear Solution (SOOTHE XP XTRA PROTECTION OP), Place 1 drop into both eyes 2 (two) times daily as needed., Disp: , Rfl:  .  B Complex-Biotin-FA (EQL B COMPLEX 100) TABS, Take 0.4 mg tablet once daily by mouth, Disp: , Rfl:  .  Calcium Citrate-Vitamin D 200-250 MG-UNIT TABS, Take 2 tabs by mouth each morning  and 1 tab by mouth in evening., Disp: , Rfl:  .  cholecalciferol (VITAMIN D) 1000 units tablet, Take 1,000 Units by mouth daily., Disp: , Rfl:  .  Coenzyme Q10 (CO Q-10) 100 MG CAPS, Take 100 mg by mouth daily. , Disp: , Rfl:  .  diltiazem (CARDIZEM CD) 240 MG 24 hr capsule, Take 240 mg by mouth daily., Disp: , Rfl:  .  Lactobacillus (ACIDOPHILUS PO), Take 1 tablet by mouth at bedtime. , Disp: , Rfl:  .  lidocaine (ASPERCREME W/LIDOCAINE) 4 % cream, Apply 1 application topically daily as needed. Apply to affected area for pain, Disp: , Rfl:  .  MEGARED OMEGA-3 KRILL OIL PO, Take 300 mg by mouth., Disp: , Rfl:  .  metoprolol tartrate (LOPRESSOR) 25 MG tablet, Take 25 mg by mouth daily. , Disp: , Rfl:  .  oxybutynin (DITROPAN) 5 MG tablet, Take 1 tablet (5 mg total) by mouth 2 (two) times daily., Disp: 180 tablet, Rfl: 3 .  traMADol (ULTRAM) 50 MG tablet, Take 1-2 tablets (50-100 mg total) by mouth every 4 (four) hours as needed for moderate pain., Disp: 60 tablet, Rfl: 0 .  XARELTO 20 MG TABS tablet, TAKE ONE TABLEY BY MOUTH DAILY WITH SUPPER, Disp: 90 tablet, Rfl: 4 .  DULoxetine (CYMBALTA) 30 MG capsule, Take 1 capsule (30 mg total) by mouth daily., Disp: 30 capsule, Rfl: 1 .  oxyCODONE (OXY IR/ROXICODONE) 5 MG immediate release tablet, Take 1-2 tablets (5-10 mg total) every 6 (six) hours as needed by mouth for severe pain or breakthrough pain. (Patient not taking: Reported on  10/09/2017), Disp: 30 tablet, Rfl: 0  ROS  Constitutional: Denies any fever or chills Gastrointestinal: No reported hemesis, hematochezia, vomiting, or acute GI distress Musculoskeletal: Denies any acute onset joint swelling, redness, loss of ROM, or weakness Neurological: No reported episodes of acute onset apraxia, aphasia, dysarthria, agnosia, amnesia, paralysis, loss of coordination, or loss of consciousness  Allergies  Ms. Bonnes is allergic to aspirin.  [place]  Drug: Ms. Vanvorst  reports that she does not use drugs. Alcohol:  reports that she does not drink alcohol. Tobacco:  reports that she quit smoking about 45 years ago. she has never used smokeless tobacco. Medical:  has a past medical history of Arthritis, Bladder disorder, Chicken pox, Chronic a-fib ([place]), DVT (deep venous thrombosis) ([place]), Dyspnea, Dysrhythmia, Edema, Hay fever, Heart murmur, Hypertension, Irregular heart beat, Seizure ([place]), Stroke ([place]), and Unspecified atrial fibrillation ([place]). Surgical: Ms. Betsill  has a past surgical history that includes ablation (2007); Tonsillectomy; bilateral wrist surgery (2003); Colonoscopy; Breast surgery; Cataract extraction w/PHACO (Right, 06/01/2015); Cataract extraction w/PHACO (Left, 06/22/2015); Knee Arthroplasty (Right, 03/01/2016); Breast biopsy (Left); Total knee arthroplasty (Right); Joint replacement; Dilation and curettage of uterus; Knee Arthroplasty (Left, 04/23/2017); and Ablation. Family: family history includes Arthritis in her paternal grandmother; Atrial fibrillation in her mother; Congestive Heart Failure in her mother; Diabetes in her paternal grandfather; Melanoma in her mother; Stroke in her maternal grandmother and mother.  Constitutional Exam  General appearance: Well nourished, well developed, and well hydrated. In no apparent acute distress Vitals:   11/05/17 1410  BP: (!) 125/51  Pulse: 69  Resp: 16  Temp: 98.6 F (37 C)  TempSrc: Oral  SpO2:  95%  Weight: 165 lb (74.8 kg)  Height: _0  (1.549 m)   BMI Assessment: Estimated body mass index is 31.18 kg/m as calculated from the following:   Height as of this encounter: _1  (1.549 m).  Weight as of this encounter: 165 lb (74.8 kg).  BMI interpretation table: BMI level Category Range association with higher incidence of chronic pain  <18 kg/m2 Underweight   18.5-24.9 kg/m2 Ideal body weight   25-29.9 kg/m2 Overweight Increased incidence by 20%  30-34.9 kg/m2 Obese (Class I) Increased incidence by 68%  35-39.9 kg/m2 Severe obesity (Class II) Increased incidence by 136%  >40 kg/m2 Extreme obesity (Class III) Increased incidence by 254%   BMI Readings from Last 4 Encounters:  11/05/17 31.18 kg/m  10/09/17 31.18 kg/m  09/10/17 31.55 kg/m  08/29/17 32.31 kg/m   Wt Readings from Last 4 Encounters:  11/05/17 165 lb (74.8 kg)  10/09/17 165 lb (74.8 kg)  09/10/17 167 lb (75.8 kg)  08/29/17 171 lb (77.6 kg)  Psych/Mental status: Alert, oriented x 3 (person, place, & time)       Eyes: PERLA Respiratory: No evidence of acute respiratory distress  Cervical Spine Area Exam  Skin & Axial Inspection: No masses, redness, edema, swelling, or associated skin lesions Alignment: Symmetrical Functional ROM: Unrestricted ROM      Stability: No instability detected Muscle Tone/Strength: Functionally intact. No obvious neuro-muscular anomalies detected. Sensory (Neurological): Unimpaired Palpation: No palpable anomalies              Upper Extremity (UE) Exam    Side: Right upper extremity  Side: Left upper extremity  Skin & Extremity Inspection: Skin color, temperature, and hair growth are WNL. No peripheral edema or cyanosis. No masses, redness, swelling, asymmetry, or associated skin lesions. No contractures.  Skin & Extremity Inspection: Skin color, temperature, and hair growth are WNL. No peripheral edema or cyanosis. No masses, redness, swelling, asymmetry, or associated skin  lesions. No contractures.  Functional ROM: Unrestricted ROM          Functional ROM: Unrestricted ROM          Muscle Tone/Strength: Functionally intact. No obvious neuro-muscular anomalies detected.  Muscle Tone/Strength: Functionally intact. No obvious neuro-muscular anomalies detected.  Sensory (Neurological): Unimpaired          Sensory (Neurological): Unimpaired          Palpation: No palpable anomalies              Palpation: No palpable anomalies              Specialized Test(s): Deferred         Specialized Test(s): Deferred          Thoracic Spine Area Exam  Skin & Axial Inspection: No masses, redness, or swelling Alignment: Symmetrical Functional ROM: Unrestricted ROM Stability: No instability detected Muscle Tone/Strength: Functionally intact. No obvious neuro-muscular anomalies detected. Sensory (Neurological): Unimpaired Muscle strength & Tone: No palpable anomalies  Lumbar Spine Area Exam  Skin & Axial Inspection: No masses, redness, or swelling Alignment: Symmetrical Functional ROM: Unrestricted ROM      Stability: No instability detected Muscle Tone/Strength: Functionally intact. No obvious neuro-muscular anomalies detected. Sensory (Neurological): Unimpaired Palpation: No palpable anomalies       Provocative Tests: Lumbar Hyperextension and rotation test: Positive bilaterally for facet joint pain. Lumbar Lateral bending test: Positive ipsilateral radicular pain, bilaterally. Positive for bilateral foraminal stenosis. Patrick's Maneuver: Positive                    Gait & Posture Assessment  Ambulation: Unassisted Gait: Relatively normal for age and body habitus Posture: WNL   Lower Extremity Exam    Side: Right lower extremity  Side: Left  lower extremity  Skin & Extremity Inspection: Evidence of prior arthroplastic surgery  Skin & Extremity Inspection: Evidence of prior arthroplastic surgery  Functional ROM: Unrestricted ROM          Functional ROM:  Unrestricted ROM          Muscle Tone/Strength: Functionally intact. No obvious neuro-muscular anomalies detected.  Muscle Tone/Strength: Functionally intact. No obvious neuro-muscular anomalies detected.  Sensory (Neurological): Unimpaired  Sensory (Neurological): Unimpaired  Palpation: Tender  Palpation: Tender  Bilateral knees tender to palpation  Assessment  Primary Diagnosis & Pertinent Problem List: The primary encounter diagnosis was Lumbar spondylosis. Diagnoses of Lumbar degenerative disc disease, Lumbar radiculopathy, and Chronic pain syndrome were also pertinent to this visit.  Status Diagnosis  Persistent Persistent Persistent 1. Lumbar spondylosis   2. Lumbar degenerative disc disease   3. Lumbar radiculopathy   4. Chronic pain syndrome      General Recommendations: The pain condition that the patient suffers from is best treated with a multidisciplinary approach that involves an increase in physical activity to prevent de-conditioning and worsening of the pain cycle, as well as psychological counseling (formal and/or informal) to address the co-morbid psychological affects of pain. Treatment will often involve judicious use of pain medications and interventional procedures to decrease the pain, allowing the patient to participate in the physical activity that will ultimately produce long-lasting pain reductions. The goal of the multidisciplinary approach is to return the patient to a higher level of overall function and to restore their ability to perform activities of daily living.   Very pleasant 77 year old female who presents with chronic pain of her axial lumbar spine, bilateral buttocks, secondary to lumbar spondylosis, lumbar degenerative disc disease, chronic lumbar radiculopathy and knee pain secondary to bilateral knee osteoarthritis status post right and left knee replacement surgery (right May 2017, left July 2018).  Patient has obtained moderate benefit from  transforaminal lumbar epidural steroid injections performed by Dr. Sharlet Salina, last of which was on August 23, 2017.  Patient continues to endorse axial low back pain that radiates along her sides in her lumbosacral region and down to her thighs, more pronounced on the right.  We discussed performing lumbar epidural steroid injections for her lumbar radicular pain versus lumbar facet blocks for her lumbar spondylosis and facet arthropathy.  Patient is anticoagulated with Xarelto for her atrial fibrillation.  In regards to medication management, patient did not find any significant benefit with her increase in gabapentin to 300 mg twice daily.  In addition she did endorse side effects of balance and cognitive issues which she did not like.  I have instructed the patient to discontinue her gabapentin and we will trial Cymbalta at 30 mg daily.  Furthermore we will have the pain clinic attempt to obtain cardiac clearance so that the patient can stop her Brilinta for 3 days in the event that we need to do an interventional procedure.  Patient in agreement with plan and no questions or concerns.   Plan: -Stop gabapentin -Start Cymbalta 30 mg daily -Clinic to obtain cardiac clearance so that interventional procedures can be scheduled in the future if patient's pain is not improving with medication adjustments..  Patient will need to be off Brilinta for approximately 3 days prior to any neuraxial procedure.   Plan of Care  Pharmacotherapy (Medications Ordered): Meds ordered this encounter  Medications  . DULoxetine (CYMBALTA) 30 MG capsule    Sig: Take 1 capsule (30 mg total) by mouth daily.    Dispense:  30 capsule    Refill:  1   Time Note: Greater than 50% of the 15 minute(s) of face-to-face time spent with Ms. Loge, was spent in counseling/coordination of care regarding: the treatment plan, treatment alternatives, medication side effects and the goals of pain management (increased in  functionality). Provider-requested follow-up: Return in about 4 weeks (around 12/03/2017) for Medication Management.  Future Appointments  Date Time Provider [place]  11/29/2017 10:45 AM Gillis Santa, MD [facility] None    Primary Care Physician: Birdie Sons, MD Location: [facility] Out[facility] Management Facility Note by: Gillis Santa, M.D Date: 11/05/2017; Time: 2:56 PM  Patient Instructions  1. Will obtain cardiac clearance so that we can consider doing lumbar epidural if medications are not effective 2. Stop Gabapentin, start Cymbalta 30 mg daily

## 2017-11-19 ENCOUNTER — Other Ambulatory Visit: Payer: Self-pay | Admitting: Family Medicine

## 2017-11-29 ENCOUNTER — Other Ambulatory Visit: Payer: Self-pay | Admitting: Student in an Organized Health Care Education/Training Program

## 2017-11-29 ENCOUNTER — Ambulatory Visit
Admission: RE | Admit: 2017-11-29 | Discharge: 2017-11-29 | Disposition: A | Discharge status: Home / Self Care | Payer: PPO | Source: Ambulatory Visit | Attending: Student in an Organized Health Care Education/Training Program | Admitting: Student in an Organized Health Care Education/Training Program

## 2017-11-29 ENCOUNTER — Encounter: Payer: Self-pay | Admitting: Student in an Organized Health Care Education/Training Program

## 2017-11-29 ENCOUNTER — Ambulatory Visit
Payer: PPO | Attending: Student in an Organized Health Care Education/Training Program | Admitting: Student in an Organized Health Care Education/Training Program

## 2017-11-29 ENCOUNTER — Telehealth: Payer: Self-pay

## 2017-11-29 ENCOUNTER — Other Ambulatory Visit: Payer: Self-pay

## 2017-11-29 VITALS — BP 128/66 | HR 94 | Temp 97.8°F | Resp 18 | Ht 61.0 in | Wt 160.0 lb

## 2017-11-29 DIAGNOSIS — Z79899 Other long term (current) drug therapy: Secondary | ICD-10-CM | POA: Insufficient documentation

## 2017-11-29 DIAGNOSIS — M5116 Intervertebral disc disorders with radiculopathy, lumbar region: Secondary | ICD-10-CM | POA: Diagnosis not present

## 2017-11-29 DIAGNOSIS — Z87891 Personal history of nicotine dependence: Secondary | ICD-10-CM | POA: Insufficient documentation

## 2017-11-29 DIAGNOSIS — Z5181 Encounter for therapeutic drug level monitoring: Secondary | ICD-10-CM | POA: Diagnosis not present

## 2017-11-29 DIAGNOSIS — Z96653 Presence of artificial knee joint, bilateral: Secondary | ICD-10-CM

## 2017-11-29 DIAGNOSIS — X58XXXA Exposure to other specified factors, initial encounter: Secondary | ICD-10-CM | POA: Insufficient documentation

## 2017-11-29 DIAGNOSIS — R0781 Pleurodynia: Secondary | ICD-10-CM | POA: Diagnosis not present

## 2017-11-29 DIAGNOSIS — M5416 Radiculopathy, lumbar region: Secondary | ICD-10-CM

## 2017-11-29 DIAGNOSIS — Z9889 Other specified postprocedural states: Secondary | ICD-10-CM | POA: Insufficient documentation

## 2017-11-29 DIAGNOSIS — Z86010 Personal history of colonic polyps: Secondary | ICD-10-CM | POA: Insufficient documentation

## 2017-11-29 DIAGNOSIS — G894 Chronic pain syndrome: Secondary | ICD-10-CM | POA: Diagnosis not present

## 2017-11-29 DIAGNOSIS — S2232XA Fracture of one rib, left side, initial encounter for closed fracture: Secondary | ICD-10-CM | POA: Diagnosis not present

## 2017-11-29 DIAGNOSIS — S299XXA Unspecified injury of thorax, initial encounter: Secondary | ICD-10-CM | POA: Diagnosis not present

## 2017-11-29 DIAGNOSIS — M51369 Other intervertebral disc degeneration, lumbar region without mention of lumbar back pain or lower extremity pain: Secondary | ICD-10-CM

## 2017-11-29 DIAGNOSIS — M5136 Other intervertebral disc degeneration, lumbar region: Secondary | ICD-10-CM | POA: Diagnosis not present

## 2017-11-29 DIAGNOSIS — I10 Essential (primary) hypertension: Secondary | ICD-10-CM | POA: Insufficient documentation

## 2017-11-29 DIAGNOSIS — M170 Bilateral primary osteoarthritis of knee: Secondary | ICD-10-CM | POA: Insufficient documentation

## 2017-11-29 DIAGNOSIS — M549 Dorsalgia, unspecified: Secondary | ICD-10-CM | POA: Insufficient documentation

## 2017-11-29 DIAGNOSIS — Z886 Allergy status to analgesic agent status: Secondary | ICD-10-CM | POA: Insufficient documentation

## 2017-11-29 DIAGNOSIS — Z8673 Personal history of transient ischemic attack (TIA), and cerebral infarction without residual deficits: Secondary | ICD-10-CM | POA: Insufficient documentation

## 2017-11-29 MED ORDER — NORTRIPTYLINE HCL 10 MG PO CAPS: 10.0000 mg | ORAL_CAPSULE | Freq: Every day | ORAL | 1 refills | Status: DC

## 2017-11-29 NOTE — Telephone Encounter (Signed)
Pt left message wanting advice, She fell and had pain on left side , wants to know what she can take

## 2017-11-29 NOTE — Progress Notes (Signed)
Safety precautions to be maintained throughout the outpatient stay will include: orient to surroundings, keep bed in low position, maintain call bell within reach at all times, provide assistance with transfer out of bed and ambulation.  

## 2017-11-29 NOTE — Patient Instructions (Addendum)
1. Schedule for R lumbar ESI. Stop Xarelto 3 days prior to procedure 2. Rx for Nortriptyline 10 mg qhs 3. Rib xrays Epidural Steroid Injection Patient Information  Description: The epidural space surrounds the nerves as they exit the spinal cord.  In some patients, the nerves can be compressed and inflamed by a bulging disc or a tight spinal canal (spinal stenosis).  By injecting steroids into the epidural space, we can bring irritated nerves into direct contact with a potentially helpful medication.  These steroids act directly on the irritated nerves and can reduce swelling and inflammation which often leads to decreased pain.  Epidural steroids may be injected anywhere along the spine and from the neck to the low back depending upon the location of your pain.   After numbing the skin with local anesthetic (like Novocaine), a small needle is passed into the epidural space slowly.  You may experience a sensation of pressure while this is being done.  The entire block usually last less than 10 minutes.  Conditions which may be treated by epidural steroids:   Low back and leg pain  Neck and arm pain  Spinal stenosis  Post-laminectomy syndrome  Herpes zoster (shingles) pain  Pain from compression fractures  Preparation for the injection:  1. Do not eat any solid food or dairy products within 8 hours of your appointment.  2. You may drink clear liquids up to 3 hours before appointment.  Clear liquids include water, black coffee, juice or soda.  No milk or cream please. 3. You may take your regular medication, including pain medications, with a sip of water before your appointment  Diabetics should hold regular insulin (if taken separately) and take 1/2 normal NPH dos the morning of the procedure.  Carry some sugar containing items with you to your appointment. 4. A driver must accompany you and be prepared to drive you home after your procedure.  5. Bring all your current medications with  your. 6. An IV may be inserted and sedation may be given at the discretion of the physician.   7. A blood pressure cuff, EKG and other monitors will often be applied during the procedure.  Some patients may need to have extra oxygen administered for a short period. 8. You will be asked to provide medical information, including your allergies, prior to the procedure.  We must know immediately if you are taking blood thinners (like Coumadin/Warfarin)  Or if you are allergic to IV iodine contrast (dye). We must know if you could possible be pregnant.  Possible side-effects:  Bleeding from needle site  Infection (rare, may require surgery)  Nerve injury (rare)  Numbness & tingling (temporary)  Difficulty urinating (rare, temporary)  Spinal headache ( a headache worse with upright posture)  Light -headedness (temporary)  Pain at injection site (several days)  Decreased blood pressure (temporary)  Weakness in arm/leg (temporary)  Pressure sensation in back/neck (temporary)  Call if you experience:  Fever/chills associated with headache or increased back/neck pain.  Headache worsened by an upright position.  New onset weakness or numbness of an extremity below the injection site  Hives or difficulty breathing (go to the emergency room)  Inflammation or drainage at the infection site  Severe back/neck pain  Any new symptoms which are concerning to you  Please note:  Although the local anesthetic injected can often make your back or neck feel good for several hours after the injection, the pain will likely return.  It takes 3-7 days  for steroids to work in the epidural space.  You may not notice any pain relief for at least that one week.  If effective, we will often do a series of three injections spaced 3-6 weeks apart to maximally decrease your pain.  After the initial series, we generally will wait several months before considering a repeat injection of the same  type.  If you have any questions, please call [phone] [facility]

## 2017-11-29 NOTE — Progress Notes (Signed)
Patient's Name: [name] [name]  MRN: [id number]  Referring Provider: Birdie Sons, MD  DOB: 10/05/1941  PCP: Birdie Sons, MD  DOS: 11/29/2017  Note by: Gillis Santa, MD  Service setting: Ambulatory outpatient  Specialty: Interventional Pain Management  Location: [facility] (AMB) Pain Management Facility    Patient type: Established   Primary Reason(s) for Visit: Encounter for prescription drug management. (Level of risk: moderate)  CC: Back Pain (right, lower)  HPI  [name] [name] is a 77 y.o. year old, female patient, who comes today for a medication management evaluation. 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 Her primarily concern today is the Back Pain (right, lower)  Pain Assessment: Location: Lower Back Radiating: back of right upper leg Onset: More than a month ago Duration: Chronic pain Quality: Burning, Sharp, Discomfort, Tightness Severity: 7 /10 (self-reported pain score)  Note: Reported level is compatible with observation.                         When using our objective Pain Scale, levels between 6 and 10/10 are said to belong in an emergency room, as it progressively worsens from a 6/10, described as severely limiting, requiring emergency care not usually available at an outpatient pain management facility. At a 6/10 level, communication becomes difficult and requires great effort. Assistance to reach the emergency department may be required. Facial flushing and profuse sweating along with potentially dangerous increases in heart rate and blood pressure will be evident. Effect on ADL:   Timing: Constant Modifying factors: lying or sitting down with feet elevated  [name] [name] was last  scheduled for an appointment on 11/05/2017 for medication management. During today's appointment we reviewed [name] [name]'s chronic pain status, as well as her outpatient medication regimen.  The patient's last visit on May 05, 2018, her gabapentin was discontinued given balance issues.  She was started on Cymbalta 30 mg daily.  She states that she does not like how this medication makes her feel.  If he also states lack of benefit from taking this medication.  Patient has stopped this medication. patient did have a fall a couple of days ago and is endorsing left thoracic pain that is worse with inspiration.  No bruising noted.  No significant shortness of breath noted.  Patient was also cleared by cardiology to stop her Xarelto 3 days prior to her scheduled procedure.  Patient had mistakenly stopped her Xarelto 3 days ago thinking that she would have an interventional procedure performed today.  This was a misunderstanding.  We will get her scheduled for a lumbar epidural steroid injection in 1-2 weeks.  She is instructed to stop her Xarelto 3 days prior to her procedure.  The patient  reports that she does not use drugs. Her body mass index is 30.23 kg/m.  Further details on both, my assessment(s), as well as the proposed treatment plan, please see below.   Laboratory Chemistry  Inflammation Markers (CRP: Acute Phase) (ESR: Chronic Phase) Lab Results  Component Value Date   CRP <0.8 04/11/2017   ESRSEDRATE 8 04/11/2017                 Rheumatology Markers No results found for: RF, ANA, LABURIC, URICUR, LYMEIGGIGMAB, LYMEABIGMQN  Renal Function Markers Lab Results  Component Value Date   BUN 15 04/25/2017   CREATININE 0.59 04/25/2017   GFRAA >60 04/25/2017   GFRNONAA >60 04/25/2017                 Hepatic Function Markers Lab Results  Component Value Date   AST 26 04/11/2017   ALT 22 04/11/2017   ALBUMIN 4.5 04/11/2017   ALKPHOS 40 04/11/2017                  Electrolytes Lab Results  Component Value Date   NA 137 04/25/2017   K 4.0 04/25/2017   CL 104 04/25/2017   CALCIUM 8.7 (L) 04/25/2017   MG 2.4 (H) 09/27/2015   PHOS 3.6 12/29/2015                 Neuropathy Markers Lab Results  Component Value Date   HGBA1C 5.4 08/25/2013                 Bone Pathology Markers Lab Results  Component Value Date   VD25OH 39.0 08/02/2016                 Coagulation Parameters Lab Results  Component Value Date   INR 1.4 05/11/2017   LABPROT 22.8 (H) 04/23/2017   APTT 34 04/11/2017   PLT 150 04/25/2017                 Cardiovascular Markers Lab Results  Component Value Date   CKTOTAL 95 09/27/2015   CKMB 1.7 08/24/2013   TROPONINI <0.01 09/27/2015   HGB 11.5 (L) 04/25/2017   HCT 34.1 (L) 04/25/2017                 CA Markers No results found for: CEA, CA125, LABCA2               Note: Lab results reviewed.  Recent Diagnostic Imaging Results  DG Ribs Unilateral Right CLINICAL DATA:  77 year old female with right rib pain after tripping and falling on the floor in her home this past Friday.  EXAM: RIGHT RIBS - 2 VIEW  COMPARISON:  None.  FINDINGS: No fracture or other bone lesions are seen involving the ribs. Severe dextroconvex scoliosis and associated multilevel degenerative disc disease. Moderate degenerative osteoarthritis is present in the right acromioclavicular joint. Atherosclerotic calcifications present in the transverse aorta. The lungs are otherwise clear.  IMPRESSION: 1. No evidence of acute rib fracture. 2. Severe dextroconvex thoracolumbar scoliosis with associated multilevel degenerative disc disease. 3. Right AC joint osteoarthritis. 4.  Aortic Atherosclerosis (ICD10-170.0)  Electronically Signed   By: Jacqulynn Cadet M.D.   On: 08/30/2017 08:42  Complexity Note: Imaging results reviewed. Results shared with [name] [name], using Layman's terms.                         Meds   Current  Outpatient Medications:  .  alendronate (FOSAMAX) 70 MG tablet, Take 1 tablet once a week by mouth., Disp: , Rfl:  .  Artificial Tear Solution (SOOTHE XP XTRA PROTECTION OP), Place 1 drop into both eyes 2 (two) times daily as needed., Disp: , Rfl:  .  B Complex-Biotin-FA (EQL B COMPLEX 100) TABS, Take 0.4 mg tablet once daily by mouth, Disp: , Rfl:  .  Calcium Citrate-Vitamin D 200-250 MG-UNIT TABS, Take 2 tabs by mouth each morning and 1 tab by mouth in evening., Disp: , Rfl:  .  cholecalciferol (VITAMIN D) 1000 units tablet, Take 1,000 Units by mouth daily., Disp: , Rfl:  .  Coenzyme Q10 (CO Q-10) 100 MG CAPS, Take 100 mg by mouth daily. , Disp: , Rfl:  .  diltiazem (CARDIZEM CD) 240 MG 24 hr capsule, Take 240 mg by mouth daily., Disp: , Rfl:  .  Lactobacillus (ACIDOPHILUS PO), Take 1 tablet by mouth at bedtime. , Disp: , Rfl:  .  lidocaine (ASPERCREME W/LIDOCAINE) 4 % cream, Apply 1 application topically daily as needed. Apply to affected area for pain, Disp: , Rfl:  .  MEGARED OMEGA-3 KRILL OIL PO, Take 300 mg by mouth., Disp: , Rfl:  .  metoprolol tartrate (LOPRESSOR) 25 MG tablet, Take 25 mg by mouth daily. , Disp: , Rfl:  .  oxybutynin (DITROPAN) 5 MG tablet, Take 1 tablet (5 mg total) by mouth 2 (two) times daily., Disp: 180 tablet, Rfl: 3 .  XARELTO 20 MG TABS tablet, TAKE ONE TABLEY BY MOUTH DAILY WITH SUPPER, Disp: 90 tablet, Rfl: 4 .  acetaminophen (TYLENOL) 650 MG CR tablet, Take 650 mg by mouth every 8 (eight) hours as needed for pain., Disp: , Rfl:  .  nortriptyline (PAMELOR) 10 MG capsule, Take 1 capsule (10 mg total) by mouth at bedtime., Disp: 30 capsule, Rfl: 1  ROS  Constitutional: Denies any fever or chills Gastrointestinal: No reported hemesis, hematochezia, vomiting, or acute GI distress Musculoskeletal: Denies any acute onset joint swelling, redness, loss of ROM, or weakness Neurological: No reported episodes of acute onset apraxia, aphasia, dysarthria, agnosia, amnesia,  paralysis, loss of coordination, or loss of consciousness  Allergies  Ms. Xia is allergic to aspirin.  [place]  Drug: Ms. Fahey  reports that she does not use drugs. Alcohol:  reports that she does not drink alcohol. Tobacco:  reports that she quit smoking about 45 years ago. she has never used smokeless tobacco. Medical:  has a past medical history of Arthritis, Bladder disorder, Chicken pox, Chronic a-fib ([place]), DVT (deep venous thrombosis) ([place]), Dyspnea, Dysrhythmia, Edema, Hay fever, Heart murmur, Hypertension, Irregular heart beat, Seizure ([place]), Stroke ([place]), and Unspecified atrial fibrillation ([place]). Surgical: Ms. Clemson  has a past surgical history that includes ablation (2007); Tonsillectomy; bilateral wrist surgery (2003); Colonoscopy; Breast surgery; Cataract extraction w/PHACO (Right, 06/01/2015); Cataract extraction w/PHACO (Left, 06/22/2015); Knee Arthroplasty (Right, 03/01/2016); Breast biopsy (Left); Total knee arthroplasty (Right); Joint replacement; Dilation and curettage of uterus; Knee Arthroplasty (Left, 04/23/2017); and Ablation. Family: family history includes Arthritis in her paternal grandmother; Atrial fibrillation in her mother; Congestive Heart Failure in her mother; Diabetes in her paternal grandfather; Melanoma in her mother; Stroke in her maternal grandmother and mother.  Constitutional Exam  General appearance: Well nourished, well developed, and well hydrated. In no apparent acute distress Vitals:   11/29/17 1036  BP: 128/66  Pulse: 94  Resp: 18  Temp: 97.8 F (36.6 C)  TempSrc: Oral  SpO2: 100%  Weight: 160 lb (72.6 kg)  Height: '5\' 1"'  (1.549 m)   BMI Assessment: Estimated body mass index is 30.23 kg/m as calculated from the following:   Height as of this encounter: '5\' 1"'  (1.549 m).   Weight as of this encounter: 160 lb (72.6 kg).  BMI interpretation table: BMI level Category Range association with higher incidence of chronic pain  <18  kg/m2 Underweight   18.5-24.9 kg/m2 Ideal body weight   25-29.9 kg/m2 Overweight Increased incidence by 20%  30-34.9 kg/m2 Obese (Class I) Increased incidence by 68%  35-39.9  kg/m2 Severe obesity (Class II) Increased incidence by 136%  >40 kg/m2 Extreme obesity (Class III) Increased incidence by 254%   BMI Readings from Last 4 Encounters:  11/29/17 30.23 kg/m  11/05/17 31.18 kg/m  10/09/17 31.18 kg/m  09/10/17 31.55 kg/m   Wt Readings from Last 4 Encounters:  11/29/17 160 lb (72.6 kg)  11/05/17 165 lb (74.8 kg)  10/09/17 165 lb (74.8 kg)  09/10/17 167 lb (75.8 kg)  Psych/Mental status: Alert, oriented x 3 (person, place, & time)       Eyes: PERLA Respiratory: No evidence of acute respiratory distress  Cervical Spine Area Exam  Skin & Axial Inspection: No masses, redness, edema, swelling, or associated skin lesions Alignment: Symmetrical Functional ROM: Unrestricted ROM      Stability: No instability detected Muscle Tone/Strength: Functionally intact. No obvious neuro-muscular anomalies detected. Sensory (Neurological): Unimpaired Palpation: No palpable anomalies              Upper Extremity (UE) Exam    Side: Right upper extremity  Side: Left upper extremity  Skin & Extremity Inspection: Skin color, temperature, and hair growth are WNL. No peripheral edema or cyanosis. No masses, redness, swelling, asymmetry, or associated skin lesions. No contractures.  Skin & Extremity Inspection: Skin color, temperature, and hair growth are WNL. No peripheral edema or cyanosis. No masses, redness, swelling, asymmetry, or associated skin lesions. No contractures.  Functional ROM: Unrestricted ROM          Functional ROM: Unrestricted ROM          Muscle Tone/Strength: Functionally intact. No obvious neuro-muscular anomalies detected.  Muscle Tone/Strength: Functionally intact. No obvious neuro-muscular anomalies detected.  Sensory (Neurological): Unimpaired          Sensory (Neurological):  Unimpaired          Palpation: No palpable anomalies              Palpation: No palpable anomalies              Specialized Test(s): Deferred         Specialized Test(s): Deferred          Thoracic Spine Area Exam  Skin & Axial Inspection: No masses, redness, or swelling Alignment: Symmetrical Functional ROM: Unrestricted ROM Stability: No instability detected Muscle Tone/Strength: Functionally intact. No obvious neuro-muscular anomalies detected. Sensory (Neurological): Unimpaired Muscle strength & Tone: No palpable anomalies Lumbar Spine Area Exam  Skin & Axial Inspection:No masses, redness, or swelling Alignment:Symmetrical Functional UEK:[id number] ROM Stability:No instability detected Muscle Tone/Strength:Functionally intact. No obvious neuro-muscular anomalies detected. Sensory (Neurological):Unimpaired Palpation:No palpable anomalies Provocative Tests: Lumbar Hyperextension and rotation test:Positivebilaterally for facet joint pain. Lumbar Lateral bending test:Positiveipsilateral radicular pain, bilaterally. Positive for bilateral foraminal stenosis. Patrick's Maneuver:Positive  Gait & Posture Assessment  Ambulation:Unassisted Gait:Relatively normal for age and body habitus Posture:WNL  Lower Extremity Exam    Side:Right lower extremity  Side:Left lower extremity  Skin & Extremity Inspection:Evidence of prior arthroplastic surgery  Skin & Extremity Inspection:Evidence of prior arthroplastic surgery  Functional VWP:[id number] ROM  Functional MOL:[id number] ROM  Muscle Tone/Strength:Functionally intact. No obvious neuro-muscular anomalies detected.  Muscle Tone/Strength:Functionally intact. No obvious neuro-muscular anomalies detected.  Sensory (Neurological):Unimpaired  Sensory (Neurological):Unimpaired  Palpation:Tender  Palpation:Tender  Bilateral knees tender to  palpation   Assessment  Primary Diagnosis & Pertinent Problem List: The primary encounter diagnosis was Lumbar radiculopathy. Diagnoses of Lumbar degenerative disc disease, Chronic pain syndrome, Status post total bilateral knee replacement, and Rib pain on left side were also pertinent  to this visit.  Status Diagnosis  Persistent Persistent Persistent 1. Lumbar radiculopathy   2. Lumbar degenerative disc disease   3. Chronic pain syndrome   4. Status post total bilateral knee replacement   5. Rib pain on left side       General Recommendations: The pain condition that the patient suffers from is best treated with a multidisciplinary approach that involves an increase in physical activity to prevent de-conditioning and worsening of the pain cycle, as well as psychological counseling (formal and/or informal) to address the co-morbid psychological affects of pain. Treatment will often involve judicious use of pain medications and interventional procedures to decrease the pain, allowing the patient to participate in the physical activity that will ultimately produce long-lasting pain reductions. The goal of the multidisciplinary approach is to return the patient to a higher level of overall function and to restore their ability to perform activities of daily living.  Very pleasant 77 year old female who presents with chronic pain of her axial lumbar spine, bilateral buttocks, secondary to lumbar spondylosis, lumbar degenerative disc disease, chronic lumbar radiculopathy and knee pain secondary to bilateral knee osteoarthritis status post right and left knee replacement surgery (right May 2017, left July 2018). Patient has obtained moderate benefit from transforaminal lumbar epidural steroid injections performed by Dr. Waynard Reeds of which was on August 23, 2017. Patient continues to endorse axial low back pain that radiates along her sides in her lumbosacral region and down to her thighs, more  pronounced on the right.  We discussed performing lumbar epidural steroid injections for her lumbar radicular pain versus lumbar facet blocks for her lumbar spondylosis and facet arthropathy.  Patient is anticoagulated with Xarelto for her atrial fibrillation.  Since her care with me, patient has tried gabapentin and most recently Cymbalta which resulted in balance issues and were not effective for her pain.  Furthermore the patient states that she does not like how the Cymbalta made her feel from a cognitive standpoint.  She has discontinued Cymbalta.  We will trial nortriptyline 10 mg nightly today.  Furthermore the patient has received cardiac clearance to stop her Xarelto 3 days prior to her scheduled procedure.  Patient mistakenly stopped her Xarelto 3 days prior to her scheduled appointment today.  I have instructed the patient to resume her Xarelto and stop it 3 days prior to her scheduled lumbar epidural steroid injection for lumbar radiculopathy.  Plan: -Stop Cymbalta. -Nortriptyline as below -Plan for lumbar epidural steroid injection.  Patient instructed to stop Xarelto 3 days prior to scheduled procedure.  Has already obtain cardiac clearance.  Plan of Care  Pharmacotherapy (Medications Ordered): Meds ordered this encounter  Medications  . nortriptyline (PAMELOR) 10 MG capsule    Sig: Take 1 capsule (10 mg total) by mouth at bedtime.    Dispense:  30 capsule    Refill:  1   Lab-work, procedure(s), and/or referral(s): Orders Placed This Encounter  Procedures  . Lumbar Epidural Injection    Previous medication trials: Gabapentin (balance issues, cognitive issues), Cymbalta (balance issues, cognitive issues)  Provider-requested follow-up: Return for Procedure. Time Note: Greater than 50% of the 25 minute(s) of face-to-face time spent with Ms. Kretzschmar, was spent in counseling/coordination of care regarding: Ms. Forrey primary cause of pain, the treatment plan, treatment  alternatives, the risks and possible complications of proposed treatment, going over the informed consent and realistic expectations. Future Appointments  Date Time Provider [place]  12/05/2017 11:00 AM Gillis Santa, MD [facility] None  Primary Care Physician: Birdie Sons, MD Location: [facility] Note by: Gillis Santa, M.D Date: 11/29/2017; Time: 1:13 PM  Patient Instructions  1. Schedule for R lumbar ESI. Stop Xarelto 3 days prior to procedure 2. Rx for Nortriptyline 10 mg qhs 3. Rib xrays Epidural Steroid Injection Patient Information  Description: The epidural space surrounds the nerves as they exit the spinal cord.  In some patients, the nerves can be compressed and inflamed by a bulging disc or a tight spinal canal (spinal stenosis).  By injecting steroids into the epidural space, we can bring irritated nerves into direct contact with a potentially helpful medication.  These steroids act directly on the irritated nerves and can reduce swelling and inflammation which often leads to decreased pain.  Epidural steroids may be injected anywhere along the spine and from the neck to the low back depending upon the location of your pain.   After numbing the skin with local anesthetic (like Novocaine), a small needle is passed into the epidural space slowly.  You may experience a sensation of pressure while this is being done.  The entire block usually last less than 10 minutes.  Conditions which may be treated by epidural steroids:   Low back and leg pain  Neck and arm pain  Spinal stenosis  Post-laminectomy syndrome  Herpes zoster (shingles) pain  Pain from compression fractures  Preparation for the injection:  1. Do not eat any solid food or dairy products within 8 hours of your appointment.  2. You may drink clear liquids up to 3 hours before appointment.  Clear liquids include water, black coffee, juice or soda.  No milk or cream  please. 3. You may take your regular medication, including pain medications, with a sip of water before your appointment  Diabetics should hold regular insulin (if taken separately) and take 1/2 normal NPH dos the morning of the procedure.  Carry some sugar containing items with you to your appointment. 4. A driver must accompany you and be prepared to drive you home after your procedure.  5. Bring all your current medications with your. 6. An IV may be inserted and sedation may be given at the discretion of the physician.   7. A blood pressure cuff, EKG and other monitors will often be applied during the procedure.  Some patients may need to have extra oxygen administered for a short period. 8. You will be asked to provide medical information, including your allergies, prior to the procedure.  We must know immediately if you are taking blood thinners (like Coumadin/Warfarin)  Or if you are allergic to IV iodine contrast (dye). We must know if you could possible be pregnant.  Possible side-effects:  Bleeding from needle site  Infection (rare, may require surgery)  Nerve injury (rare)  Numbness & tingling (temporary)  Difficulty urinating (rare, temporary)  Spinal headache ( a headache worse with upright posture)  Light -headedness (temporary)  Pain at injection site (several days)  Decreased blood pressure (temporary)  Weakness in arm/leg (temporary)  Pressure sensation in back/neck (temporary)  Call if you experience:  Fever/chills associated with headache or increased back/neck pain.  Headache worsened by an upright position.  New onset weakness or numbness of an extremity below the injection site  Hives or difficulty breathing (go to the emergency room)  Inflammation or drainage at the infection site  Severe back/neck pain  Any new symptoms which are concerning to you  Please note:  Although the local  anesthetic injected can often make your back or neck feel good  for several hours after the injection, the pain will likely return.  It takes 3-7 days for steroids to work in the epidural space.  You may not notice any pain relief for at least that one week.  If effective, we will often do a series of three injections spaced 3-6 weeks apart to maximally decrease your pain.  After the initial series, we generally will wait several months before considering a repeat injection of the same type.  If you have any questions, please call [phone] [facility]

## 2017-12-05 ENCOUNTER — Ambulatory Visit
Admission: RE | Admit: 2017-12-05 | Discharge: 2017-12-05 | Disposition: A | Discharge status: Home / Self Care | Payer: PPO | Source: Ambulatory Visit | Attending: Student in an Organized Health Care Education/Training Program | Admitting: Student in an Organized Health Care Education/Training Program

## 2017-12-05 ENCOUNTER — Encounter: Payer: Self-pay | Admitting: Student in an Organized Health Care Education/Training Program

## 2017-12-05 ENCOUNTER — Other Ambulatory Visit: Payer: Self-pay

## 2017-12-05 ENCOUNTER — Ambulatory Visit (HOSPITAL_BASED_OUTPATIENT_CLINIC_OR_DEPARTMENT_OTHER): Payer: PPO | Admitting: Student in an Organized Health Care Education/Training Program

## 2017-12-05 VITALS — BP 143/95 | HR 88 | Temp 98.3°F | Resp 11 | Ht 61.0 in | Wt 159.3 lb

## 2017-12-05 DIAGNOSIS — Z96653 Presence of artificial knee joint, bilateral: Secondary | ICD-10-CM | POA: Diagnosis not present

## 2017-12-05 DIAGNOSIS — M5416 Radiculopathy, lumbar region: Secondary | ICD-10-CM | POA: Insufficient documentation

## 2017-12-05 MED ORDER — LIDOCAINE HCL (PF) 1 % IJ SOLN
INTRAMUSCULAR | Status: AC
  Filled 2017-12-05: qty 5

## 2017-12-05 MED ORDER — LIDOCAINE HCL (PF) 1 % IJ SOLN
5.0000 mL | Freq: Once | INTRAMUSCULAR | Status: AC
  Administered 2017-12-05: 5 mL

## 2017-12-05 MED ORDER — SODIUM CHLORIDE 0.9 % IJ SOLN
INTRAMUSCULAR | Status: AC
  Filled 2017-12-05: qty 10

## 2017-12-05 MED ORDER — DEXAMETHASONE SODIUM PHOSPHATE 10 MG/ML IJ SOLN
10.0000 mg | Freq: Once | INTRAMUSCULAR | Status: AC
  Administered 2017-12-05: 10 mg
  Filled 2017-12-05: qty 1

## 2017-12-05 MED ORDER — IOPAMIDOL (ISOVUE-M 200) INJECTION 41%
10.0000 mL | Freq: Once | INTRAMUSCULAR | Status: AC
  Administered 2017-12-05: 10 mL via EPIDURAL
  Filled 2017-12-05: qty 10

## 2017-12-05 MED ORDER — ROPIVACAINE HCL 2 MG/ML IJ SOLN
2.0000 mL | Freq: Once | INTRAMUSCULAR | Status: AC
  Administered 2017-12-05: 10 mL via EPIDURAL
  Filled 2017-12-05: qty 10

## 2017-12-05 MED ORDER — SODIUM CHLORIDE 0.9% FLUSH
2.0000 mL | Freq: Once | INTRAVENOUS | Status: AC
  Administered 2017-12-05: 10 mL

## 2017-12-05 NOTE — Patient Instructions (Signed)
____________________________________________________________________________________________  Preparing for your procedure (without sedation) Instructions: . Oral Intake: Do not eat or drink anything for at least 3 hours prior to your procedure. . Transportation: Unless otherwise stated by your physician, you may drive yourself after the procedure. . Blood Pressure Medicine: Take your blood pressure medicine with a sip of water the morning of the procedure. . Blood thinners:  . Diabetics on insulin: Notify the staff so that you can be scheduled 1st case in the morning. If your diabetes requires high dose insulin, take only  of your normal insulin dose the morning of the procedure and notify the staff that you have done so. . Preventing infections: Shower with an antibacterial soap the morning of your procedure.  . Build-up your immune system: Take 1000 mg of Vitamin C with every meal (3 times a day) the day prior to your procedure. [place]land Kitchen Antibiotics: Inform the staff if you have a condition or reason that requires you to take antibiotics before dental procedures. . Pregnancy: If you are pregnant, call and cancel the procedure. . Sickness: If you have a cold, fever, or any active infections, call and cancel the procedure. . Arrival: You must be in the facility at least 30 minutes prior to your scheduled procedure. . Children: Do not bring any children with you. . Dress appropriately: Bring dark clothing that you would not mind if they get stained. . Valuables: Do not bring any jewelry or valuables. Procedure appointments are reserved for interventional treatments only. Marland Kitchen No Prescription Refills. . No medication changes will be discussed during procedure appointments. . No disability issues will be discussed. ____________________________________________________________________________________________   Epidural Steroid Injection Patient Information  Description: The epidural space surrounds  the nerves as they exit the spinal cord.  In some patients, the nerves can be compressed and inflamed by a bulging disc or a tight spinal canal (spinal stenosis).  By injecting steroids into the epidural space, we can bring irritated nerves into direct contact with a potentially helpful medication.  These steroids act directly on the irritated nerves and can reduce swelling and inflammation which often leads to decreased pain.  Epidural steroids may be injected anywhere along the spine and from the neck to the low back depending upon the location of your pain.   After numbing the skin with local anesthetic (like Novocaine), a small needle is passed into the epidural space slowly.  You may experience a sensation of pressure while this is being done.  The entire block usually last less than 10 minutes.  Conditions which may be treated by epidural steroids:   Low back and leg pain  Neck and arm pain  Spinal stenosis  Post-laminectomy syndrome  Herpes zoster (shingles) pain  Pain from compression fractures  Preparation for the injection:  1. Do not eat any solid food or dairy products within 8 hours of your appointment.  2. You may drink clear liquids up to 3 hours before appointment.  Clear liquids include water, black coffee, juice or soda.  No milk or cream please. 3. You may take your regular medication, including pain medications, with a sip of water before your appointment  Diabetics should hold regular insulin (if taken separately) and take 1/2 normal NPH dos the morning of the procedure.  Carry some sugar containing items with you to your appointment. 4. A driver must accompany you and be prepared to drive you home after your procedure.  5. Bring all your current medications with your. 6. An IV may  be inserted and sedation may be given at the discretion of the physician.   7. A blood pressure cuff, EKG and other monitors will often be applied during the procedure.  Some patients may  need to have extra oxygen administered for a short period. 8. You will be asked to provide medical information, including your allergies, prior to the procedure.  We must know immediately if you are taking blood thinners (like Coumadin/Warfarin)  Or if you are allergic to IV iodine contrast (dye). We must know if you could possible be pregnant.  Possible side-effects:  Bleeding from needle site  Infection (rare, may require surgery)  Nerve injury (rare)  Numbness & tingling (temporary)  Difficulty urinating (rare, temporary)  Spinal headache ( a headache worse with upright posture)  Light -headedness (temporary)  Pain at injection site (several days)  Decreased blood pressure (temporary)  Weakness in arm/leg (temporary)  Pressure sensation in back/neck (temporary)  Call if you experience:  Fever/chills associated with headache or increased back/neck pain.  Headache worsened by an upright position.  New onset weakness or numbness of an extremity below the injection site  Hives or difficulty breathing (go to the emergency room)  Inflammation or drainage at the infection site  Severe back/neck pain  Any new symptoms which are concerning to you  Please note:  Although the local anesthetic injected can often make your back or neck feel good for several hours after the injection, the pain will likely return.  It takes 3-7 days for steroids to work in the epidural space.  You may not notice any pain relief for at least that one week.  If effective, we will often do a series of three injections spaced 3-6 weeks apart to maximally decrease your pain.  After the initial series, we generally will wait several months before considering a repeat injection of the same type.  If you have any questions, please call [phone] [facility]

## 2017-12-05 NOTE — Progress Notes (Signed)
Patient's Name: [name] [name]  MRN: [id number]  Referring Provider: Gillis Santa, MD  DOB: 1941/01/02  PCP: Birdie Sons, MD  DOS: 12/05/2017  Note by: Gillis Santa, MD  Service setting: Ambulatory outpatient  Specialty: Interventional Pain Management  Patient type: Established  Location: [facility] (AMB) Pain Management Facility  Visit type: Interventional Procedure   Primary Reason for Visit: Interventional Pain Management Treatment. CC: Sciatica (right)  Procedure:  Anesthesia, Analgesia, Anxiolysis:  Type: Diagnostic Epidural Steroid Injection Region: Caudal Level: Sacrococcygeal   Laterality: Midline aiming at the right  Type: Local Anesthesia Local Anesthetic: Lidocaine 1% Route: Infiltration (Wolfe/IM) IV Access: Declined Sedation: Declined  Indication(s): Analgesia and Anxiety   Indications: 1. Lumbar radiculopathy    Pain Score: Pre-procedure: 0-No pain/10 Post-procedure: 0-No pain/10  Patient stopped Xarelto 3 days ago.  Pre-op Assessment:  [name] [name] is a 77 y.o. (year old), female patient, seen today for interventional treatment. She  has a past surgical history that includes ablation (2007); Tonsillectomy; bilateral wrist surgery (2003); Colonoscopy; Breast surgery; Cataract extraction w/PHACO (Right, 06/01/2015); Cataract extraction w/PHACO (Left, 06/22/2015); Knee Arthroplasty (Right, 03/01/2016); Breast biopsy (Left); Total knee arthroplasty (Right); Joint replacement; Dilation and curettage of uterus; Knee Arthroplasty (Left, 04/23/2017); and Ablation. [name] [name] has a current medication list which includes the following prescription(s): acetaminophen, alendronate, artificial tear solution, eql b complex 100, calcium citrate-vitamin d, cholecalciferol, co q-10, diltiazem, lactobacillus, lidocaine, krill oil, metoprolol tartrate, nortriptyline, oxybutynin, and xarelto. Her primarily concern today is the Sciatica (right)  Initial Vital Signs:  Pulse Rate: 88 Temp:  98.3 F (36.8 C) Resp: 18 BP: 135/84 SpO2: 100 %  BMI: Estimated body mass index is 30.1 kg/m as calculated from the following:   Height as of this encounter: 5\' 1"  (1.549 m).   Weight as of this encounter: 159 lb 4.8 oz (72.3 kg).  Risk Assessment: Allergies: Reviewed. She is allergic to aspirin.  Allergy Precautions: None required Coagulopathies: Reviewed. None identified.  Blood-thinner therapy: None at this time Active Infection(s): Reviewed. None identified. [name] [name] is afebrile  Site Confirmation: [name] [name] was asked to confirm the procedure and laterality before marking the site Procedure checklist: Completed Consent: Before the procedure and under the influence of no sedative(s), amnesic(s), or anxiolytics, the patient was informed of the treatment options, risks and possible complications. To fulfill our ethical and legal obligations, as recommended by the American Medical Association's Code of Ethics, I have informed the patient of my clinical impression; the nature and purpose of the treatment or procedure; the risks, benefits, and possible complications of the intervention; the alternatives, including doing nothing; the risk(s) and benefit(s) of the alternative treatment(s) or procedure(s); and the risk(s) and benefit(s) of doing nothing. The patient was provided information about the general risks and possible complications associated with the procedure. These may include, but are not limited to: failure to achieve desired goals, infection, bleeding, organ or nerve damage, allergic reactions, paralysis, and death. In addition, the patient was informed of those risks and complications associated to Spine-related procedures, such as failure to decrease pain; infection (i.e.: Meningitis, epidural or intraspinal abscess); bleeding (i.e.: epidural hematoma, subarachnoid hemorrhage, or any other type of intraspinal or peri-dural bleeding); organ or nerve damage (i.e.: Any  type of peripheral nerve, nerve root, or spinal cord injury) with subsequent damage to sensory, motor, and/or autonomic systems, resulting in permanent pain, numbness, and/or weakness of one or several areas of the body; allergic reactions; (i.e.: anaphylactic reaction); and/or death. Furthermore, the patient was  informed of those risks and complications associated with the medications. These include, but are not limited to: allergic reactions (i.e.: anaphylactic or anaphylactoid reaction(s)); adrenal axis suppression; blood sugar elevation that in diabetics may result in ketoacidosis or comma; water retention that in patients with history of congestive heart failure may result in shortness of breath, pulmonary edema, and decompensation with resultant heart failure; weight gain; swelling or edema; medication-induced neural toxicity; particulate matter embolism and blood vessel occlusion with resultant organ, and/or nervous system infarction; and/or aseptic necrosis of one or more joints. Finally, the patient was informed that Medicine is not an exact science; therefore, there is also the possibility of unforeseen or unpredictable risks and/or possible complications that may result in a catastrophic outcome. The patient indicated having understood very clearly. We have given the patient no guarantees and we have made no promises. Enough time was given to the patient to ask questions, all of which were answered to the patient's satisfaction. [name] [name] has indicated that she wanted to continue with the procedure. Attestation: I, the ordering provider, attest that I have discussed with the patient the benefits, risks, side-effects, alternatives, likelihood of achieving goals, and potential problems during recovery for the procedure that I have provided informed consent. Date: 12/05/2017  Time: N/A  Pre-Procedure Preparation:  Monitoring: As per clinic protocol. Respiration, ETCO2, SpO2, BP, heart rate and  rhythm monitor placed and checked for adequate function Safety Precautions: Patient was assessed for positional comfort and pressure points before starting the procedure. Time-out: I initiated and conducted the "Time-out" before starting the procedure, as per protocol. The patient was asked to participate by confirming the accuracy of the "Time Out" information. Verification of the correct person, site, and procedure were performed and confirmed by me, the nursing staff, and the patient. "Time-out" conducted as per Joint Commission's Universal Protocol (UP.01.01.01). "Time-out" Date & Time: 12/05/2017; 1142 hrs.  Description of Procedure Process:   Position: Prone Target Area: Caudal Epidural Canal. Approach: Midline approach. Area Prepped: Entire Posterior Sacrococcygeal Region Prepping solution: ChloraPrep (2% chlorhexidine gluconate and 70% isopropyl alcohol) Safety Precautions: Aspiration looking for blood return was conducted prior to all injections. At no point did we inject any substances, as a needle was being advanced. No attempts were made at seeking any paresthesias. Safe injection practices and needle disposal techniques used. Medications properly checked for expiration dates. SDV (single dose vial) medications used. Description of the Procedure: Protocol guidelines were followed. The patient was placed in position over the fluoroscopy table. The target area was identified and the area prepped in the usual manner. Skin desensitized using vapocoolant spray. Skin & deeper tissues infiltrated with local anesthetic. Appropriate amount of time allowed to pass for local anesthetics to take effect. The procedure needles were then advanced to the target area. Proper needle placement secured. Negative aspiration confirmed. Solution injected in intermittent fashion, asking for systemic symptoms every 0.5cc of injectate. The needles were then removed and the area cleansed, making sure to leave some of  the prepping solution back to take advantage of its long term bactericidal properties. Vitals:   12/05/17 1142 12/05/17 1148 12/05/17 1153 12/05/17 1159  BP: (!) 138/99 (!) 150/107 (!) 138/99 (!) 143/95  Pulse:      Resp: 16 11 14 11   Temp:      TempSrc:      SpO2: 98% 97% 97% 96%  Weight:      Height:        Start Time: 1144 hrs. End Time:  1158 hrs. Materials:  Needle(s) Type: Epidural needle Gauge: 17G Length: 3.5-in Medication(s): Please see orders for medications and dosing details. 8 CC solution made of 5 cc of preservative-free normal saline, 1 cc of Decadron 10 mg/cc, 2 cc of 0.2% ropivacaine. Imaging Guidance (Spinal):  Type of Imaging Technique: Fluoroscopy Guidance (Spinal) Indication(s): Assistance in needle guidance and placement for procedures requiring needle placement in or near specific anatomical locations not easily accessible without such assistance. Exposure Time: Please see nurses notes. Contrast: Before injecting any contrast, we confirmed that the patient did not have an allergy to iodine, shellfish, or radiological contrast. Once satisfactory needle placement was completed at the desired level, radiological contrast was injected. Contrast injected under live fluoroscopy. No contrast complications. See chart for type and volume of contrast used. Fluoroscopic Guidance: I was personally present during the use of fluoroscopy. "Tunnel Vision Technique" used to obtain the best possible view of the target area. Parallax error corrected before commencing the procedure. "Direction-depth-direction" technique used to introduce the needle under continuous pulsed fluoroscopy. Once target was reached, antero-posterior, oblique, and lateral fluoroscopic projection used confirm needle placement in all planes. Images permanently stored in EMR. Interpretation: I personally interpreted the imaging intraoperatively. Adequate needle placement confirmed in multiple planes. Appropriate  spread of contrast into desired area was observed. No evidence of afferent or efferent intravascular uptake. No intrathecal or subarachnoid spread observed. Permanent images saved into the patient's record.  Antibiotic Prophylaxis:   Anti-infectives (From admission, onward)   None     Indication(s): None identified  Post-operative Assessment:  Post-procedure Vital Signs:  Pulse Rate: 88 Temp: 98.3 F (36.8 C) Resp: 11 BP: (!) 143/95 SpO2: 96 %  EBL: None  Complications: No immediate post-treatment complications observed by team, or reported by patient.  Note: The patient tolerated the entire procedure well. A repeat set of vitals were taken after the procedure and the patient was kept under observation following institutional policy, for this type of procedure. Post-procedural neurological assessment was performed, showing return to baseline, prior to discharge. The patient was provided with post-procedure discharge instructions, including a section on how to identify potential problems. Should any problems arise concerning this procedure, the patient was given instructions to immediately contact us, at any time, without hesitation. In any case, we plan to contact the patient by telephone for a follow-up status report regarding this interventional procedure.  Comments:  No additional relevant information. 5 out of 5 strength bilateral lower extremity: Plantar flexion, dorsiflexion, knee flexion, knee extension.  Plan of Care   Imaging Orders     DG C-Arm 1-60 Min-No Report Procedure Orders    No procedure(s) ordered today    Medications ordered for procedure: Meds ordered this encounter  Medications  . iopamidol (ISOVUE-M) 41 % intrathecal injection 10 mL  . ropivacaine (PF) 2 mg/mL (0.2%) (NAROPIN) injection 2 mL  . sodium chloride flush (NS) 0.9 % injection 2 mL  . dexamethasone (DECADRON) injection 10 mg  . lidocaine (PF) (XYLOCAINE) 1 % injection 5 mL   Medications  administered: We administered iopamidol, ropivacaine (PF) 2 mg/mL (0.2%), sodium chloride flush, dexamethasone, and lidocaine (PF).  See the medical record for exact dosing, route, and time of administration.  New Prescriptions   No medications on file   Disposition: Discharge home  Discharge Date & Time: 12/05/2017; 1205 hrs.   Physician-requested Follow-up: Return in about 3 weeks (around 12/26/2017) for Post Procedure Evaluation.  Patient instructed to restart Xarelto tomorrow.  Future Appointments  Date Time Provider [place]  12/26/2017 10:30 AM Gillis Santa, MD [facility] None   Primary Care Physician: Birdie Sons, MD Location: [facility] Outpatient Pain Management Facility Note by: Gillis Santa, MD Date: 12/05/2017; Time: 2:12 PM  Disclaimer:  Medicine is not an exact science. The only guarantee in medicine is that nothing is guaranteed. It is important to note that the decision to proceed with this intervention was based on the information collected from the patient. The Data and conclusions were drawn from the patient's questionnaire, the interview, and the physical examination. Because the information was provided in large part by the patient, it cannot be guaranteed that it has not been purposely or unconsciously manipulated. Every effort has been made to obtain as much relevant data as possible for this evaluation. It is important to note that the conclusions that lead to this procedure are derived in large part from the available data. Always take into account that the treatment will also be dependent on availability of resources and existing treatment guidelines, considered by other Pain Management Practitioners as being common knowledge and practice, at the time of the intervention. For Medico-Legal purposes, it is also important to point out that variation in procedural techniques and pharmacological choices are the acceptable norm. The indications, contraindications,  technique, and results of the above procedure should only be interpreted and judged by a Board-Certified Interventional Pain Specialist with extensive familiarity and expertise in the same exact procedure and technique.

## 2017-12-05 NOTE — Progress Notes (Signed)
Safety precautions to be maintained throughout the outpatient stay will include: orient to surroundings, keep bed in low position, maintain call bell within reach at all times, provide assistance with transfer out of bed and ambulation.  

## 2017-12-06 ENCOUNTER — Telehealth: Payer: Self-pay | Admitting: *Deleted

## 2017-12-06 NOTE — Telephone Encounter (Signed)
Voicemail left with patient to call our office if there are questions or concerns re; procedure on yesterday.  

## 2017-12-12 ENCOUNTER — Telehealth: Payer: Self-pay | Admitting: Student in an Organized Health Care Education/Training Program

## 2017-12-12 NOTE — Telephone Encounter (Signed)
Spoke with patient, states the procedure that was performed on 12/05/17, LESI gave good relief for approx 1 week.  The pain is now back "worse" than before when standing and feels like she is sitting on a rock when sitting.  Patient does admit that during the time that she felt better she was very busy with activity and we did touch on that she may have overdone it.  Recommended ice to the affected area.  Patient is asking if she can take Tylenol with her Nortriptyline as she was advised not to with Cymbalta.  Told her I would run this by Dr Holley Raring and would let her know what he recommends.

## 2017-12-12 NOTE — Telephone Encounter (Signed)
Called patient to let her know that it is okay to take Tylenol with nortriptyline. Patient verbalizes u/o information,

## 2017-12-12 NOTE — Telephone Encounter (Signed)
Patient states her injections worked for little while but not she is in same pain as before and wants to know if there is something she can take to help. Next appt is 12-26-17 for procedure follow up

## 2017-12-26 ENCOUNTER — Ambulatory Visit
Payer: PPO | Attending: Student in an Organized Health Care Education/Training Program | Admitting: Student in an Organized Health Care Education/Training Program

## 2017-12-26 ENCOUNTER — Encounter: Payer: Self-pay | Admitting: Student in an Organized Health Care Education/Training Program

## 2017-12-26 ENCOUNTER — Other Ambulatory Visit: Payer: Self-pay

## 2017-12-26 VITALS — BP 137/82 | HR 99 | Temp 97.4°F | Ht 61.0 in | Wt 160.0 lb

## 2017-12-26 DIAGNOSIS — Z79899 Other long term (current) drug therapy: Secondary | ICD-10-CM | POA: Insufficient documentation

## 2017-12-26 DIAGNOSIS — Z833 Family history of diabetes mellitus: Secondary | ICD-10-CM | POA: Insufficient documentation

## 2017-12-26 DIAGNOSIS — Z96651 Presence of right artificial knee joint: Secondary | ICD-10-CM | POA: Insufficient documentation

## 2017-12-26 DIAGNOSIS — M5416 Radiculopathy, lumbar region: Secondary | ICD-10-CM | POA: Diagnosis not present

## 2017-12-26 DIAGNOSIS — Z7902 Long term (current) use of antithrombotics/antiplatelets: Secondary | ICD-10-CM | POA: Diagnosis not present

## 2017-12-26 DIAGNOSIS — Z87891 Personal history of nicotine dependence: Secondary | ICD-10-CM | POA: Diagnosis not present

## 2017-12-26 DIAGNOSIS — Z886 Allergy status to analgesic agent status: Secondary | ICD-10-CM | POA: Diagnosis not present

## 2017-12-26 DIAGNOSIS — Z8673 Personal history of transient ischemic attack (TIA), and cerebral infarction without residual deficits: Secondary | ICD-10-CM | POA: Insufficient documentation

## 2017-12-26 DIAGNOSIS — R569 Unspecified convulsions: Secondary | ICD-10-CM | POA: Insufficient documentation

## 2017-12-26 DIAGNOSIS — M5136 Other intervertebral disc degeneration, lumbar region: Secondary | ICD-10-CM | POA: Diagnosis not present

## 2017-12-26 DIAGNOSIS — I4891 Unspecified atrial fibrillation: Secondary | ICD-10-CM | POA: Insufficient documentation

## 2017-12-26 DIAGNOSIS — Z9842 Cataract extraction status, left eye: Secondary | ICD-10-CM | POA: Diagnosis not present

## 2017-12-26 DIAGNOSIS — Z86718 Personal history of other venous thrombosis and embolism: Secondary | ICD-10-CM | POA: Insufficient documentation

## 2017-12-26 DIAGNOSIS — M1990 Unspecified osteoarthritis, unspecified site: Secondary | ICD-10-CM | POA: Diagnosis not present

## 2017-12-26 DIAGNOSIS — G894 Chronic pain syndrome: Secondary | ICD-10-CM | POA: Insufficient documentation

## 2017-12-26 DIAGNOSIS — I10 Essential (primary) hypertension: Secondary | ICD-10-CM | POA: Diagnosis not present

## 2017-12-26 DIAGNOSIS — Z8249 Family history of ischemic heart disease and other diseases of the circulatory system: Secondary | ICD-10-CM | POA: Diagnosis not present

## 2017-12-26 DIAGNOSIS — Z823 Family history of stroke: Secondary | ICD-10-CM | POA: Insufficient documentation

## 2017-12-26 DIAGNOSIS — Z9889 Other specified postprocedural states: Secondary | ICD-10-CM | POA: Diagnosis not present

## 2017-12-26 DIAGNOSIS — Z8261 Family history of arthritis: Secondary | ICD-10-CM | POA: Insufficient documentation

## 2017-12-26 DIAGNOSIS — Z808 Family history of malignant neoplasm of other organs or systems: Secondary | ICD-10-CM | POA: Insufficient documentation

## 2017-12-26 DIAGNOSIS — Z8619 Personal history of other infectious and parasitic diseases: Secondary | ICD-10-CM | POA: Diagnosis not present

## 2017-12-26 DIAGNOSIS — Z9841 Cataract extraction status, right eye: Secondary | ICD-10-CM | POA: Insufficient documentation

## 2017-12-26 NOTE — Patient Instructions (Addendum)
Repeat caudal ESI. Please stop Xarelto 3 days prior to scheduled  procedure.  ____________________________________________________________________________________________  General Risks and Possible Complications  Patient Responsibilities: It is important that you read this as it is part of your informed consent. It is our duty to inform you of the risks and possible complications associated with treatments offered to you. It is your responsibility as a patient to read this and to ask questions about anything that is not clear or that you believe was not covered in this document.  Patient's Rights: You have the right to refuse treatment. You also have the right to change your mind, even after initially having agreed to have the treatment done. However, under this last option, if you wait until the last second to change your mind, you may be charged for the materials used up to that point.  Introduction: Medicine is not an Chief Strategy Officer. Everything in Medicine, including the lack of treatment(s), carries the potential for danger, harm, or loss (which is by definition: Risk). In Medicine, a complication is a secondary problem, condition, or disease that can aggravate an already existing one. All treatments carry the risk of possible complications. The fact that a side effects or complications occurs, does not imply that the treatment was conducted incorrectly. It must be clearly understood that these can happen even when everything is done following the highest safety standards.  No treatment: You can choose not to proceed with the proposed treatment alternative. The "PRO(s)" would include: avoiding the risk of complications associated with the therapy. The "CON(s)" would include: not getting any of the treatment benefits. These benefits fall under one of three categories: diagnostic; therapeutic; and/or palliative. Diagnostic benefits include: getting information which can ultimately lead to improvement  of the disease or symptom(s). Therapeutic benefits are those associated with the successful treatment of the disease. Finally, palliative benefits are those related to the decrease of the primary symptoms, without necessarily curing the condition (example: decreasing the pain from a flare-up of a chronic condition, such as incurable terminal cancer).  General Risks and Complications: These are associated to most interventional treatments. They can occur alone, or in combination. They fall under one of the following six (6) categories: no benefit or worsening of symptoms; bleeding; infection; nerve damage; allergic reactions; and/or death. 1. No benefits or worsening of symptoms: In Medicine there are no guarantees, only probabilities. No healthcare provider can ever guarantee that a medical treatment will work, they can only state the probability that it may. Furthermore, there is always the possibility that the condition may worsen, either directly, or indirectly, as a consequence of the treatment. 2. Bleeding: This is more common if the patient is taking a blood thinner, either prescription or over the counter (example: Goody Powders, Fish oil, Aspirin, Garlic, etc.), or if suffering a condition associated with impaired coagulation (example: Hemophilia, cirrhosis of the liver, low platelet counts, etc.). However, even if you do not have one on these, it can still happen. If you have any of these conditions, or take one of these drugs, make sure to notify your treating physician. 3. Infection: This is more common in patients with a compromised immune system, either due to disease (example: diabetes, cancer, human immunodeficiency virus [HIV], etc.), or due to medications or treatments (example: therapies used to treat cancer and rheumatological diseases). However, even if you do not have one on these, it can still happen. If you have any of these conditions, or take one of these drugs, make sure  to notify your  treating physician. 4. Nerve Damage: This is more common when the treatment is an invasive one, but it can also happen with the use of medications, such as those used in the treatment of cancer. The damage can occur to small secondary nerves, or to large primary ones, such as those in the spinal cord and brain. This damage may be temporary or permanent and it may lead to impairments that can range from temporary numbness to permanent paralysis and/or brain death. 5. Allergic Reactions: Any time a substance or material comes in contact with our body, there is the possibility of an allergic reaction. These can range from a mild skin rash (contact dermatitis) to a severe systemic reaction (anaphylactic reaction), which can result in death. 6. Death: In general, any medical intervention can result in death, most of the time due to an unforeseen complication. ____________________________________________________________________________________________  Epidural Steroid Injection Patient Information  Description: The epidural space surrounds the nerves as they exit the spinal cord.  In some patients, the nerves can be compressed and inflamed by a bulging disc or a tight spinal canal (spinal stenosis).  By injecting steroids into the epidural space, we can bring irritated nerves into direct contact with a potentially helpful medication.  These steroids act directly on the irritated nerves and can reduce swelling and inflammation which often leads to decreased pain.  Epidural steroids may be injected anywhere along the spine and from the neck to the low back depending upon the location of your pain.   After numbing the skin with local anesthetic (like Novocaine), a small needle is passed into the epidural space slowly.  You may experience a sensation of pressure while this is being done.  The entire block usually last less than 10 minutes.  Conditions which may be treated by epidural steroids:   Low back and  leg pain  Neck and arm pain  Spinal stenosis  Post-laminectomy syndrome  Herpes zoster (shingles) pain  Pain from compression fractures  Preparation for the injection:  1. Do not eat any solid food or dairy products within 8 hours of your appointment.  2. You may drink clear liquids up to 3 hours before appointment.  Clear liquids include water, black coffee, juice or soda.  No milk or cream please. 3. You may take your regular medication, including pain medications, with a sip of water before your appointment  Diabetics should hold regular insulin (if taken separately) and take 1/2 normal NPH dos the morning of the procedure.  Carry some sugar containing items with you to your appointment. 4. A driver must accompany you and be prepared to drive you home after your procedure.  5. Bring all your current medications with your. 6. An IV may be inserted and sedation may be given at the discretion of the physician.   7. A blood pressure cuff, EKG and other monitors will often be applied during the procedure.  Some patients may need to have extra oxygen administered for a short period. 8. You will be asked to provide medical information, including your allergies, prior to the procedure.  We must know immediately if you are taking blood thinners (like Coumadin/Warfarin)  Or if you are allergic to IV iodine contrast (dye). We must know if you could possible be pregnant.  Possible side-effects:  Bleeding from needle site  Infection (rare, may require surgery)  Nerve injury (rare)  Numbness & tingling (temporary)  Difficulty urinating (rare, temporary)  Spinal headache ( a headache  worse with upright posture)  Light -headedness (temporary)  Pain at injection site (several days)  Decreased blood pressure (temporary)  Weakness in arm/leg (temporary)  Pressure sensation in back/neck (temporary)  Call if you experience:  Fever/chills associated with headache or increased back/neck  pain.  Headache worsened by an upright position.  New onset weakness or numbness of an extremity below the injection site  Hives or difficulty breathing (go to the emergency room)  Inflammation or drainage at the infection site  Severe back/neck pain  Any new symptoms which are concerning to you  Please note:  Although the local anesthetic injected can often make your back or neck feel good for several hours after the injection, the pain will likely return.  It takes 3-7 days for steroids to work in the epidural space.  You may not notice any pain relief for at least that one week.  If effective, we will often do a series of three injections spaced 3-6 weeks apart to maximally decrease your pain.  After the initial series, we generally will wait several months before considering a repeat injection of the same type.  If you have any questions, please call [phone] [facility]  Patient will stop Xarelto 3 days prior to procedure.

## 2017-12-26 NOTE — Progress Notes (Signed)
Patient's Name: [name] [name]  MRN: [id number]  Referring Provider: Birdie Sons, MD  DOB: 30-Sep-1941  PCP: Birdie Sons, MD  DOS: 12/26/2017  Note by: Gillis Santa, MD  Service setting: Ambulatory outpatient  Specialty: Interventional Pain Management  Location: [facility] (AMB) Pain Management Facility    Patient type: Established   Primary Reason(s) for Visit: Encounter for post-procedure evaluation of chronic illness with mild to moderate exacerbation CC: Hip Pain (right)  HPI  Ms. Isaacs is a 77 y.o. year old, female patient, who comes today for a post-procedure evaluation. 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 Her primarily concern today is the Hip Pain (right)  Pain Assessment: Location: Right Hip Radiating: Right sciatic Onset: More than a month ago Duration: Chronic pain Quality: Burning, Sharp, Discomfort Severity: 8 /10 (self-reported pain score)  Note: Reported level is inconsistent with clinical observations. Clinically the patient looks like a 4/10 A 4/10 is viewed as "Moderately Severe" and described as impossible to ignore for more than a few minutes. With effort, patients may still be able to manage work or participate in some social activities. Very difficult to concentrate. Signs of autonomic nervous system discharge are evident: dilated pupils (mydriasis); mild sweating (diaphoresis); sleep interference. Heart rate becomes elevated (>115 bpm). Diastolic blood pressure (lower number) rises above 100 mmHg. Patients find relief in laying down and not moving.       When using our objective Pain Scale, levels between 6 and 10/10 are said to belong in an emergency room, as it progressively  worsens from a 6/10, described as severely limiting, requiring emergency care not usually available at an outpatient pain management facility. At a 6/10 level, communication becomes difficult and requires great effort. Assistance to reach the emergency department may be required. Facial flushing and profuse sweating along with potentially dangerous increases in heart rate and blood pressure will be evident. Effect on ADL:   Timing: Intermittent Modifying factors: meds, tylenol,   Ms. Franek comes in today for post-procedure evaluation after the treatment done on 12/12/2017.  Further details on both, my assessment(s), as well as the proposed treatment plan, please see below.  Post-Procedure Assessment  12/12/2017 Procedure: Caudal ESI Pre-procedure pain score:  4/10 Post-procedure pain score: 0/10         Influential Factors: BMI: 30.23 kg/m Intra-procedural challenges: None observed.         Assessment challenges: None detected.              Reported side-effects: None.        Post-procedural adverse reactions or complications: None reported         Sedation: Please see nurses note. When no sedatives are used, the analgesic levels obtained are directly associated to the effectiveness of the local anesthetics. However, when sedation is provided, the level of analgesia obtained during the initial 1 hour following the intervention, is believed to be the result of a combination of factors. These factors may include, but are not limited to: 1. The effectiveness of the local anesthetics used. 2. The effects of the analgesic(s) and/or anxiolytic(s) used. 3. The degree of discomfort experienced by the patient at the time of the procedure. 4. The patients ability and reliability in recalling and recording the events. 5. The  presence and influence of possible secondary gains and/or psychosocial factors. Reported result: Relief experienced during the 1st hour after the procedure: 100 %  (Ultra-Short Term Relief)            Interpretative annotation: Clinically appropriate result. Analgesia during this period is likely to be Local Anesthetic and/or IV Sedative (Analgesic/Anxiolytic) related.          Effects of local anesthetic: The analgesic effects attained during this period are directly associated to the localized infiltration of local anesthetics and therefore cary significant diagnostic value as to the etiological location, or anatomical origin, of the pain. Expected duration of relief is directly dependent on the pharmacodynamics of the local anesthetic used. Long-acting (4-6 hours) anesthetics used.  Reported result: Relief during the next 4 to 6 hour after the procedure: 100 % (Short-Term Relief)            Interpretative annotation: Clinically appropriate result. Analgesia during this period is likely to be Local Anesthetic-related.          Long-term benefit: Defined as the period of time past the expected duration of local anesthetics (1 hour for short-acting and 4-6 hours for long-acting). With the possible exception of prolonged sympathetic blockade from the local anesthetics, benefits during this period are typically attributed to, or associated with, other factors such as analgesic sensory neuropraxia, antiinflammatory effects, or beneficial biochemical changes provided by agents other than the local anesthetics.  Reported result: Extended relief following procedure: 30% (Long-Term Relief)            Interpretative annotation: Clinically appropriate result. Partial relief. No permanent benefit expected. Inflammation plays a part in the etiology to the pain.          Current benefits: Defined as reported results that persistent at this point in time.   Analgesia: 0-25 %            Function: Back to baseline ROM: Back to baseline Interpretative annotation: Recurrence of symptoms. Limited therapeutic benefit. Effective diagnostic intervention.          Interpretation:  Results would suggest a successful diagnostic intervention.                  Plan:  Please see "Plan of Care" for details.                Laboratory Chemistry  Inflammation Markers (CRP: Acute Phase) (ESR: Chronic Phase) Lab Results  Component Value Date   CRP <0.8 04/11/2017   ESRSEDRATE 8 04/11/2017                         Rheumatology Markers No results found for: RF, ANA, Therisa [place], [facility]              Renal Function Markers Lab Results  Component Value Date   BUN 15 04/25/2017   CREATININE 0.59 04/25/2017   GFRAA >60 04/25/2017   GFRNONAA >60 04/25/2017                 Hepatic Function Markers Lab Results  Component Value Date   AST 26 04/11/2017   ALT 22 04/11/2017   ALBUMIN 4.5 04/11/2017   ALKPHOS 40 04/11/2017                 Electrolytes Lab Results  Component Value Date   NA 137 04/25/2017   K 4.0 04/25/2017   CL 104 04/25/2017   CALCIUM 8.7 (L) 04/25/2017  MG 2.4 (H) 09/27/2015   PHOS 3.6 12/29/2015                        Neuropathy Markers Lab Results  Component Value Date   HGBA1C 5.4 08/25/2013                 Bone Pathology Markers Lab Results  Component Value Date   VD25OH 39.0 08/02/2016                         Coagulation Parameters Lab Results  Component Value Date   INR 1.4 05/11/2017   LABPROT 22.8 (H) 04/23/2017   APTT 34 04/11/2017   PLT 150 04/25/2017                 Cardiovascular Markers Lab Results  Component Value Date   CKTOTAL 95 09/27/2015   CKMB 1.7 08/24/2013   TROPONINI <0.01 09/27/2015   HGB 11.5 (L) 04/25/2017   HCT 34.1 (L) 04/25/2017                 CA Markers No results found for: CEA, CA125, LABCA2               Note: Lab results reviewed.  Recent Diagnostic Imaging Results  DG C-Arm 1-60 Min-No Report Fluoroscopy was utilized by the requesting physician.  No radiographic  interpretation.   Complexity Note: Imaging results reviewed. Results shared with Ms.  Saulters, using Layman's terms.                         Meds   Current Outpatient Medications:  .  acetaminophen (TYLENOL) 650 MG CR tablet, Take 650 mg by mouth every 8 (eight) hours as needed for pain., Disp: , Rfl:  .  alendronate (FOSAMAX) 70 MG tablet, Take 1 tablet once a week by mouth., Disp: , Rfl:  .  Artificial Tear Solution (SOOTHE XP XTRA PROTECTION OP), Place 1 drop into both eyes 2 (two) times daily as needed., Disp: , Rfl:  .  B Complex-Biotin-FA (EQL B COMPLEX 100) TABS, Take 0.4 mg tablet once daily by mouth, Disp: , Rfl:  .  Calcium Citrate-Vitamin D 200-250 MG-UNIT TABS, Take 2 tabs by mouth each morning and 1 tab by mouth in evening., Disp: , Rfl:  .  cholecalciferol (VITAMIN D) 1000 units tablet, Take 1,000 Units by mouth daily., Disp: , Rfl:  .  Coenzyme Q10 (CO Q-10) 100 MG CAPS, Take 100 mg by mouth daily. , Disp: , Rfl:  .  diltiazem (CARDIZEM CD) 240 MG 24 hr capsule, Take 240 mg by mouth daily., Disp: , Rfl:  .  Lactobacillus (ACIDOPHILUS PO), Take 1 tablet by mouth at bedtime. , Disp: , Rfl:  .  lidocaine (ASPERCREME W/LIDOCAINE) 4 % cream, Apply 1 application topically daily as needed. Apply to affected area for pain, Disp: , Rfl:  .  MEGARED OMEGA-3 KRILL OIL PO, Take 300 mg by mouth., Disp: , Rfl:  .  metoprolol tartrate (LOPRESSOR) 25 MG tablet, Take 25 mg by mouth daily. , Disp: , Rfl:  .  nortriptyline (PAMELOR) 10 MG capsule, Take 1 capsule (10 mg total) by mouth at bedtime., Disp: 30 capsule, Rfl: 1 .  oxybutynin (DITROPAN) 5 MG tablet, Take 1 tablet (5 mg total) by mouth 2 (two) times daily., Disp: 180 tablet, Rfl: 3 .  XARELTO 20 MG TABS tablet, TAKE ONE TABLEY  BY MOUTH DAILY WITH SUPPER, Disp: 90 tablet, Rfl: 4  ROS  Constitutional: Denies any fever or chills Gastrointestinal: No reported hemesis, hematochezia, vomiting, or acute GI distress Musculoskeletal: Denies any acute onset joint swelling, redness, loss of ROM, or weakness Neurological: No  reported episodes of acute onset apraxia, aphasia, dysarthria, agnosia, amnesia, paralysis, loss of coordination, or loss of consciousness  Allergies  Ms. Norville is allergic to aspirin.  [place]  Drug: Ms. Ledwell  reports that she does not use drugs. Alcohol:  reports that she does not drink alcohol. Tobacco:  reports that she quit smoking about 45 years ago. she has never used smokeless tobacco. Medical:  has a past medical history of Arthritis, Bladder disorder, Chicken pox, Chronic a-fib ([place]), DVT (deep venous thrombosis) ([place]), Dyspnea, Dysrhythmia, Edema, Hay fever, Heart murmur, Hypertension, Irregular heart beat, Seizure ([place]), Stroke ([place]), and Unspecified atrial fibrillation ([place]). Surgical: Ms. Maiden  has a past surgical history that includes ablation (2007); Tonsillectomy; bilateral wrist surgery (2003); Colonoscopy; Breast surgery; Cataract extraction w/PHACO (Right, 06/01/2015); Cataract extraction w/PHACO (Left, 06/22/2015); Knee Arthroplasty (Right, 03/01/2016); Breast biopsy (Left); Total knee arthroplasty (Right); Joint replacement; Dilation and curettage of uterus; Knee Arthroplasty (Left, 04/23/2017); and Ablation. Family: family history includes Arthritis in her paternal grandmother; Atrial fibrillation in her mother; Congestive Heart Failure in her mother; Diabetes in her paternal grandfather; Melanoma in her mother; Stroke in her maternal grandmother and mother.  Constitutional Exam  General appearance: Well nourished, well developed, and well hydrated. In no apparent acute distress Vitals:   12/26/17 1100  BP: 137/82  Pulse: 99  Temp: (!) 97.4 F (36.3 C)  SpO2: 100%  Weight: 160 lb (72.6 kg)  Height: _0  (1.549 m)   BMI Assessment: Estimated body mass index is 30.23 kg/m as calculated from the following:   Height as of this encounter: _1  (1.549 m).   Weight as of this encounter: 160 lb (72.6 kg).  BMI interpretation table: BMI level Category Range  association with higher incidence of chronic pain  <18 kg/m2 Underweight   18.5-24.9 kg/m2 Ideal body weight   25-29.9 kg/m2 Overweight Increased incidence by 20%  30-34.9 kg/m2 Obese (Class I) Increased incidence by 68%  35-39.9 kg/m2 Severe obesity (Class II) Increased incidence by 136%  >40 kg/m2 Extreme obesity (Class III) Increased incidence by 254%   BMI Readings from Last 4 Encounters:  12/26/17 30.23 kg/m  12/05/17 30.10 kg/m  11/29/17 30.23 kg/m  11/05/17 31.18 kg/m   Wt Readings from Last 4 Encounters:  12/26/17 160 lb (72.6 kg)  12/05/17 159 lb 4.8 oz (72.3 kg)  11/29/17 160 lb (72.6 kg)  11/05/17 165 lb (74.8 kg)  Psych/Mental status: Alert, oriented x 3 (person, place, & time)       Eyes: PERLA Respiratory: No evidence of acute respiratory distress  Cervical Spine Area Exam  Skin & Axial Inspection: No masses, redness, edema, swelling, or associated skin lesions Alignment: Symmetrical Functional ROM: Unrestricted ROM      Stability: No instability detected Muscle Tone/Strength: Functionally intact. No obvious neuro-muscular anomalies detected. Sensory (Neurological): Unimpaired Palpation: No palpable anomalies              Upper Extremity (UE) Exam    Side: Right upper extremity  Side: Left upper extremity  Skin & Extremity Inspection: Skin color, temperature, and hair growth are WNL. No peripheral edema or cyanosis. No masses, redness, swelling, asymmetry, or associated skin lesions. No contractures.  Skin & Extremity Inspection: Skin color,  temperature, and hair growth are WNL. No peripheral edema or cyanosis. No masses, redness, swelling, asymmetry, or associated skin lesions. No contractures.  Functional ROM: Unrestricted ROM          Functional ROM: Unrestricted ROM          Muscle Tone/Strength: Functionally intact. No obvious neuro-muscular anomalies detected.  Muscle Tone/Strength: Functionally intact. No obvious neuro-muscular anomalies detected.   Sensory (Neurological): Unimpaired          Sensory (Neurological): Unimpaired          Palpation: No palpable anomalies              Palpation: No palpable anomalies              Specialized Test(s): Deferred         Specialized Test(s): Deferred          Thoracic Spine Area Exam  Skin & Axial Inspection: No masses, redness, or swelling Alignment: Symmetrical Functional ROM: Unrestricted ROM Stability: No instability detected Muscle Tone/Strength: Functionally intact. No obvious neuro-muscular anomalies detected. Sensory (Neurological): Unimpaired Muscle strength & Tone: No palpable anomalies  Lumbar Spine Area Exam  Skin & Axial Inspection: No masses, redness, or swelling Alignment: Symmetrical Functional ROM: Unrestricted ROM      Stability: No instability detected Muscle Tone/Strength: Functionally intact. No obvious neuro-muscular anomalies detected. Sensory (Neurological): Unimpaired Palpation: No palpable anomalies       Provocative Tests: Lumbar Hyperextension and rotation test: Positive bilaterally for facet joint pain. Lumbar Lateral bending test: Positive ipsilateral radicular pain, bilaterally. Positive for bilateral foraminal stenosis. Patrick's Maneuver: Positive for bilateral S-I arthralgia              Gait & Posture Assessment  Ambulation: Unassisted Gait: Relatively normal for age and body habitus Posture: WNL   Lower Extremity Exam    Side: Right lower extremity  Side: Left lower extremity  Skin & Extremity Inspection: Skin color, temperature, and hair growth are WNL. No peripheral edema or cyanosis. No masses, redness, swelling, asymmetry, or associated skin lesions. No contractures.  Skin & Extremity Inspection: Skin color, temperature, and hair growth are WNL. No peripheral edema or cyanosis. No masses, redness, swelling, asymmetry, or associated skin lesions. No contractures.  Functional ROM: Unrestricted ROM          Functional ROM: Unrestricted ROM           Muscle Tone/Strength: Functionally intact. No obvious neuro-muscular anomalies detected.  Muscle Tone/Strength: Functionally intact. No obvious neuro-muscular anomalies detected.  Sensory (Neurological): Unimpaired  Sensory (Neurological): Unimpaired  Palpation: No palpable anomalies  Palpation: No palpable anomalies   Assessment  Primary Diagnosis & Pertinent Problem List: The primary encounter diagnosis was Lumbar radiculopathy. Diagnoses of Chronic pain syndrome and Lumbar degenerative disc disease were also pertinent to this visit.  Status Diagnosis  Persistent Persistent Persistent 1. Lumbar radiculopathy   2. Chronic pain syndrome   3. Lumbar degenerative disc disease      General Recommendations: The pain condition that the patient suffers from is best treated with a multidisciplinary approach that involves an increase in physical activity to prevent de-conditioning and worsening of the pain cycle, as well as psychological counseling (formal and/or informal) to address the co-morbid psychological affects of pain. Treatment will often involve judicious use of pain medications and interventional procedures to decrease the pain, allowing the patient to participate in the physical activity that will ultimately produce long-lasting pain reductions. The goal of the multidisciplinary approach is to  return the patient to a higher level of overall function and to restore their ability to perform activities of daily living.  77 year old female who presents with chronic pain of her axial lumbar spine, bilateral buttocks, secondary to lumbar spondylosis, lumbar degenerative disc disease, chronic lumbar radiculopathy and knee pain secondary to bilateral knee osteoarthritis status post right and left knee replacement surgery (right May 2017, left July 2018).   Patient returns for follow-up status post caudal epidural steroid injection performed December 05, 2017.  Patient notes mild to moderate pain  relief for approximately 1 week after the procedure.  She states that she was able to perform activities of daily living with greater ease and was able to ambulate a greater distance without having to stop.  She states that approximately after 7-8 days, she noted return of her pain back to pre-block levels.  We discussed repeating the procedure and hopefully injecting more volume to hopefully extend duration of her pain relief.  Patient is in agreement with plan.  Risks and benefits of caudal epidural steroid injection were discussed.  Patient would like to proceed.  Patient instructed to stop Xarelto 3 days prior to her scheduled procedure as she did for her first caudal epidural steroid injection.  Has received cardiac clearance for this in the past.  Previous medication trials: Gabapentin (balance issues, cognitive issues), Cymbalta (balance issues, cognitive issues)  Plan of Care   Lab-work, procedure(s), and/or referral(s): Orders Placed This Encounter  Procedures  . Caudal Epidural Injection    Provider-requested follow-up: Return in about 2 weeks (around 01/09/2018) for Procedure. Time Note: Greater than 50% of the 25 minute(s) of face-to-face time spent with Ms. Reta, was spent in counseling/coordination of care regarding: Ms. Luviano primary cause of pain, the treatment plan, treatment alternatives, the risks and possible complications of proposed treatment, going over the informed consent, the results, interpretation and significance of  her recent diagnostic interventional treatment(s) and realistic expectations. Future Appointments  Date Time Provider [place]  01/09/2018  9:00 AM Gillis Santa, MD [facility] None    Primary Care Physician: Birdie Sons, MD Location: [facility] Outpatient Pain Management Facility Note by: Gillis Santa, M.D Date: 12/26/2017; Time: 2:33 PM  Patient Instructions  Repeat caudal ESI. Please stop Xarelto 3 days prior to scheduled   procedure.  ____________________________________________________________________________________________  General Risks and Possible Complications  Patient Responsibilities: It is important that you read this as it is part of your informed consent. It is our duty to inform you of the risks and possible complications associated with treatments offered to you. It is your responsibility as a patient to read this and to ask questions about anything that is not clear or that you believe was not covered in this document.  Patient's Rights: You have the right to refuse treatment. You also have the right to change your mind, even after initially having agreed to have the treatment done. However, under this last option, if you wait until the last second to change your mind, you may be charged for the materials used up to that point.  Introduction: Medicine is not an Chief Strategy Officer. Everything in Medicine, including the lack of treatment(s), carries the potential for danger, harm, or loss (which is by definition: Risk). In Medicine, a complication is a secondary problem, condition, or disease that can aggravate an already existing one. All treatments carry the risk of possible complications. The fact that a side effects or complications occurs, does not imply that the treatment was conducted  incorrectly. It must be clearly understood that these can happen even when everything is done following the highest safety standards.  No treatment: You can choose not to proceed with the proposed treatment alternative. The "PRO(s)" would include: avoiding the risk of complications associated with the therapy. The "CON(s)" would include: not getting any of the treatment benefits. These benefits fall under one of three categories: diagnostic; therapeutic; and/or palliative. Diagnostic benefits include: getting information which can ultimately lead to improvement of the disease or symptom(s). Therapeutic benefits are those  associated with the successful treatment of the disease. Finally, palliative benefits are those related to the decrease of the primary symptoms, without necessarily curing the condition (example: decreasing the pain from a flare-up of a chronic condition, such as incurable terminal cancer).  General Risks and Complications: These are associated to most interventional treatments. They can occur alone, or in combination. They fall under one of the following six (6) categories: no benefit or worsening of symptoms; bleeding; infection; nerve damage; allergic reactions; and/or death. 1. No benefits or worsening of symptoms: In Medicine there are no guarantees, only probabilities. No healthcare provider can ever guarantee that a medical treatment will work, they can only state the probability that it may. Furthermore, there is always the possibility that the condition may worsen, either directly, or indirectly, as a consequence of the treatment. 2. Bleeding: This is more common if the patient is taking a blood thinner, either prescription or over the counter (example: Goody Powders, Fish oil, Aspirin, Garlic, etc.), or if suffering a condition associated with impaired coagulation (example: Hemophilia, cirrhosis of the liver, low platelet counts, etc.). However, even if you do not have one on these, it can still happen. If you have any of these conditions, or take one of these drugs, make sure to notify your treating physician. 3. Infection: This is more common in patients with a compromised immune system, either due to disease (example: diabetes, cancer, human immunodeficiency virus [HIV], etc.), or due to medications or treatments (example: therapies used to treat cancer and rheumatological diseases). However, even if you do not have one on these, it can still happen. If you have any of these conditions, or take one of these drugs, make sure to notify your treating physician. 4. Nerve Damage: This is more common  when the treatment is an invasive one, but it can also happen with the use of medications, such as those used in the treatment of cancer. The damage can occur to small secondary nerves, or to large primary ones, such as those in the spinal cord and brain. This damage may be temporary or permanent and it may lead to impairments that can range from temporary numbness to permanent paralysis and/or brain death. 5. Allergic Reactions: Any time a substance or material comes in contact with our body, there is the possibility of an allergic reaction. These can range from a mild skin rash (contact dermatitis) to a severe systemic reaction (anaphylactic reaction), which can result in death. 6. Death: In general, any medical intervention can result in death, most of the time due to an unforeseen complication. ____________________________________________________________________________________________  Epidural Steroid Injection Patient Information  Description: The epidural space surrounds the nerves as they exit the spinal cord.  In some patients, the nerves can be compressed and inflamed by a bulging disc or a tight spinal canal (spinal stenosis).  By injecting steroids into the epidural space, we can bring irritated nerves into direct contact with a potentially helpful medication.  These  steroids act directly on the irritated nerves and can reduce swelling and inflammation which often leads to decreased pain.  Epidural steroids may be injected anywhere along the spine and from the neck to the low back depending upon the location of your pain.   After numbing the skin with local anesthetic (like Novocaine), a small needle is passed into the epidural space slowly.  You may experience a sensation of pressure while this is being done.  The entire block usually last less than 10 minutes.  Conditions which may be treated by epidural steroids:   Low back and leg pain  Neck and arm pain  Spinal  stenosis  Post-laminectomy syndrome  Herpes zoster (shingles) pain  Pain from compression fractures  Preparation for the injection:  1. Do not eat any solid food or dairy products within 8 hours of your appointment.  2. You may drink clear liquids up to 3 hours before appointment.  Clear liquids include water, black coffee, juice or soda.  No milk or cream please. 3. You may take your regular medication, including pain medications, with a sip of water before your appointment  Diabetics should hold regular insulin (if taken separately) and take 1/2 normal NPH dos the morning of the procedure.  Carry some sugar containing items with you to your appointment. 4. A driver must accompany you and be prepared to drive you home after your procedure.  5. Bring all your current medications with your. 6. An IV may be inserted and sedation may be given at the discretion of the physician.   7. A blood pressure cuff, EKG and other monitors will often be applied during the procedure.  Some patients may need to have extra oxygen administered for a short period. 8. You will be asked to provide medical information, including your allergies, prior to the procedure.  We must know immediately if you are taking blood thinners (like Coumadin/Warfarin)  Or if you are allergic to IV iodine contrast (dye). We must know if you could possible be pregnant.  Possible side-effects:  Bleeding from needle site  Infection (rare, may require surgery)  Nerve injury (rare)  Numbness & tingling (temporary)  Difficulty urinating (rare, temporary)  Spinal headache ( a headache worse with upright posture)  Light -headedness (temporary)  Pain at injection site (several days)  Decreased blood pressure (temporary)  Weakness in arm/leg (temporary)  Pressure sensation in back/neck (temporary)  Call if you experience:  Fever/chills associated with headache or increased back/neck pain.  Headache worsened by an upright  position.  New onset weakness or numbness of an extremity below the injection site  Hives or difficulty breathing (go to the emergency room)  Inflammation or drainage at the infection site  Severe back/neck pain  Any new symptoms which are concerning to you  Please note:  Although the local anesthetic injected can often make your back or neck feel good for several hours after the injection, the pain will likely return.  It takes 3-7 days for steroids to work in the epidural space.  You may not notice any pain relief for at least that one week.  If effective, we will often do a series of three injections spaced 3-6 weeks apart to maximally decrease your pain.  After the initial series, we generally will wait several months before considering a repeat injection of the same type.  If you have any questions, please call [phone] [facility]  Patient will stop Xarelto 3 days  prior to procedure.

## 2017-12-28 ENCOUNTER — Telehealth: Payer: Self-pay | Admitting: Family Medicine

## 2017-12-28 MED ORDER — NORTRIPTYLINE HCL 10 MG PO CAPS: 10.0000 mg | ORAL_CAPSULE | Freq: Every day | ORAL | 0 refills | Status: DC

## 2017-12-28 NOTE — Telephone Encounter (Signed)
Please review. Thanks!  

## 2017-12-28 NOTE — Telephone Encounter (Signed)
Pt states the Pain Clinic is closed until Monday and she needs a refill on a medication the prescribe to her; Pamelor 10 MG.  She is requesting we send her in a week supply until she is able to get in touch with them.  I informed the patient this is not something we typically do and she could be discharged because we prescribe it to her.   [place]

## 2018-01-01 ENCOUNTER — Telehealth: Payer: Self-pay

## 2018-01-01 NOTE — Telephone Encounter (Signed)
She is going to run out of nortriptoline before her appointment on the 27th. Can you call out enough for her to last until then. She got 7 from her pcp so she would need a one week supply. Please call her with questions

## 2018-01-01 NOTE — Telephone Encounter (Signed)
Called pt and left message for her to call her PCP to get Nortripoline filled.

## 2018-01-09 ENCOUNTER — Ambulatory Visit: Payer: PPO | Admitting: Student in an Organized Health Care Education/Training Program

## 2018-01-16 ENCOUNTER — Ambulatory Visit
Admission: RE | Admit: 2018-01-16 | Discharge: 2018-01-16 | Disposition: A | Discharge status: Home / Self Care | Payer: PPO | Source: Ambulatory Visit | Attending: Student in an Organized Health Care Education/Training Program | Admitting: Student in an Organized Health Care Education/Training Program

## 2018-01-16 ENCOUNTER — Ambulatory Visit (HOSPITAL_BASED_OUTPATIENT_CLINIC_OR_DEPARTMENT_OTHER): Payer: PPO | Admitting: Student in an Organized Health Care Education/Training Program

## 2018-01-16 ENCOUNTER — Encounter: Payer: Self-pay | Admitting: Student in an Organized Health Care Education/Training Program

## 2018-01-16 ENCOUNTER — Other Ambulatory Visit: Payer: Self-pay

## 2018-01-16 VITALS — BP 121/68 | HR 60 | Temp 97.8°F | Resp 16 | Ht 61.0 in | Wt 160.0 lb

## 2018-01-16 DIAGNOSIS — M545 Low back pain: Secondary | ICD-10-CM | POA: Diagnosis not present

## 2018-01-16 DIAGNOSIS — Z886 Allergy status to analgesic agent status: Secondary | ICD-10-CM | POA: Insufficient documentation

## 2018-01-16 DIAGNOSIS — M5416 Radiculopathy, lumbar region: Secondary | ICD-10-CM | POA: Diagnosis not present

## 2018-01-16 DIAGNOSIS — Z9841 Cataract extraction status, right eye: Secondary | ICD-10-CM | POA: Diagnosis not present

## 2018-01-16 DIAGNOSIS — G894 Chronic pain syndrome: Secondary | ICD-10-CM | POA: Insufficient documentation

## 2018-01-16 DIAGNOSIS — Z9889 Other specified postprocedural states: Secondary | ICD-10-CM | POA: Insufficient documentation

## 2018-01-16 DIAGNOSIS — Z96653 Presence of artificial knee joint, bilateral: Secondary | ICD-10-CM | POA: Diagnosis not present

## 2018-01-16 MED ORDER — LIDOCAINE HCL (PF) 1 % IJ SOLN
INTRAMUSCULAR | Status: AC
  Filled 2018-01-16: qty 5

## 2018-01-16 MED ORDER — NORTRIPTYLINE HCL 10 MG PO CAPS: 20.0000 mg | ORAL_CAPSULE | Freq: Every day | ORAL | 1 refills | Status: DC

## 2018-01-16 MED ORDER — ROPIVACAINE HCL 2 MG/ML IJ SOLN
2.0000 mL | Freq: Once | INTRAMUSCULAR | Status: AC
  Administered 2018-01-16: 10 mL via EPIDURAL

## 2018-01-16 MED ORDER — SODIUM CHLORIDE 0.9% FLUSH
2.0000 mL | Freq: Once | INTRAVENOUS | Status: AC
  Administered 2018-01-16: 10 mL

## 2018-01-16 MED ORDER — SODIUM CHLORIDE 0.9 % IJ SOLN
INTRAMUSCULAR | Status: AC
  Filled 2018-01-16: qty 10

## 2018-01-16 MED ORDER — DEXAMETHASONE SODIUM PHOSPHATE 10 MG/ML IJ SOLN
10.0000 mg | Freq: Once | INTRAMUSCULAR | Status: AC
  Administered 2018-01-16: 10 mg

## 2018-01-16 MED ORDER — ROPIVACAINE HCL 2 MG/ML IJ SOLN
INTRAMUSCULAR | Status: AC
  Filled 2018-01-16: qty 10

## 2018-01-16 MED ORDER — DEXAMETHASONE SODIUM PHOSPHATE 10 MG/ML IJ SOLN
INTRAMUSCULAR | Status: AC
  Filled 2018-01-16: qty 1

## 2018-01-16 MED ORDER — IOPAMIDOL (ISOVUE-M 200) INJECTION 41%
10.0000 mL | Freq: Once | INTRAMUSCULAR | Status: AC
  Administered 2018-01-16: 10 mL via EPIDURAL
  Filled 2018-01-16: qty 10

## 2018-01-16 MED ORDER — LIDOCAINE HCL (PF) 1 % IJ SOLN
4.5000 mL | Freq: Once | INTRAMUSCULAR | Status: AC
  Administered 2018-01-16: 5 mL

## 2018-01-16 NOTE — Progress Notes (Signed)
Patient's Name: [name] [name]  MRN: [id number]  Referring Provider: Birdie Sons, MD  DOB: [birth year]  PCP: Birdie Sons, MD  DOS: 01/16/2018  Note by: Gillis Santa, MD  Service setting: Ambulatory outpatient  Specialty: Interventional Pain Management  Patient type: Established  Location: [facility] (AMB) Pain Management Facility  Visit type: Interventional Procedure   Primary Reason for Visit: Interventional Pain Management Treatment. CC: Back Pain (low right)  Procedure:  Anesthesia, Analgesia, Anxiolysis:  Type: Diagnostic Epidural Steroid Injection #2 Region: Caudal Level: Sacrococcygeal   Laterality: Midline aiming at the right  Type: Local Anesthesia Local Anesthetic: Lidocaine 1% Route: Infiltration (Tigard/IM) IV Access: Declined Sedation: Declined  Indication(s): Analgesia and Anxiety   Indications: 1. Lumbar radiculopathy   2. Chronic pain syndrome    Pain Score: Pre-procedure: 9 /10 Post-procedure: 1 /10  Patient stopped Xarelto 3 days ago.  Pre-op Assessment:  [name] [name] is a 77 y.o. (year old), female patient, seen today for interventional treatment. She  has a past surgical history that includes ablation (2007); Tonsillectomy; bilateral wrist surgery (2003); Colonoscopy; Breast surgery; Cataract extraction w/PHACO (Right, 06/01/2015); Cataract extraction w/PHACO (Left, 06/22/2015); Knee Arthroplasty (Right, 03/01/2016); Breast biopsy (Left); Total knee arthroplasty (Right); Joint replacement; Dilation and curettage of uterus; Knee Arthroplasty (Left, 04/23/2017); and Ablation. [name] [name] has a current medication list which includes the following prescription(s): acetaminophen, alendronate, artificial tear solution, eql b complex 100, calcium citrate-vitamin d, cholecalciferol, co q-10, diltiazem, lactobacillus, lidocaine, krill oil, metoprolol tartrate, nortriptyline, oxybutynin, and xarelto. Her primarily concern today is the Back Pain (low right)  Initial Vital  Signs:  Pulse Rate: 60 Temp: 97.8 F (36.6 C) Resp: 18 BP: 116/72 SpO2: 100 %  BMI: Estimated body mass index is 30.23 kg/m as calculated from the following:   Height as of this encounter: 5\' 1"  (1.549 m).   Weight as of this encounter: 160 lb (72.6 kg).  Risk Assessment: Allergies: Reviewed. She is allergic to aspirin.  Allergy Precautions: None required Coagulopathies: Reviewed. None identified.  Blood-thinner therapy: None at this time Active Infection(s): Reviewed. None identified. [name] [name] is afebrile  Site Confirmation: [name] [name] was asked to confirm the procedure and laterality before marking the site Procedure checklist: Completed Consent: Before the procedure and under the influence of no sedative(s), amnesic(s), or anxiolytics, the patient was informed of the treatment options, risks and possible complications. To fulfill our ethical and legal obligations, as recommended by the American Medical Association's Code of Ethics, I have informed the patient of my clinical impression; the nature and purpose of the treatment or procedure; the risks, benefits, and possible complications of the intervention; the alternatives, including doing nothing; the risk(s) and benefit(s) of the alternative treatment(s) or procedure(s); and the risk(s) and benefit(s) of doing nothing. The patient was provided information about the general risks and possible complications associated with the procedure. These may include, but are not limited to: failure to achieve desired goals, infection, bleeding, organ or nerve damage, allergic reactions, paralysis, and death. In addition, the patient was informed of those risks and complications associated to Spine-related procedures, such as failure to decrease pain; infection (i.e.: Meningitis, epidural or intraspinal abscess); bleeding (i.e.: epidural hematoma, subarachnoid hemorrhage, or any other type of intraspinal or peri-dural bleeding); organ or  nerve damage (i.e.: Any type of peripheral nerve, nerve root, or spinal cord injury) with subsequent damage to sensory, motor, and/or autonomic systems, resulting in permanent pain, numbness, and/or weakness of one or several areas of the body; allergic  reactions; (i.e.: anaphylactic reaction); and/or death. Furthermore, the patient was informed of those risks and complications associated with the medications. These include, but are not limited to: allergic reactions (i.e.: anaphylactic or anaphylactoid reaction(s)); adrenal axis suppression; blood sugar elevation that in diabetics may result in ketoacidosis or comma; water retention that in patients with history of congestive heart failure may result in shortness of breath, pulmonary edema, and decompensation with resultant heart failure; weight gain; swelling or edema; medication-induced neural toxicity; particulate matter embolism and blood vessel occlusion with resultant organ, and/or nervous system infarction; and/or aseptic necrosis of one or more joints. Finally, the patient was informed that Medicine is not an exact science; therefore, there is also the possibility of unforeseen or unpredictable risks and/or possible complications that may result in a catastrophic outcome. The patient indicated having understood very clearly. We have given the patient no guarantees and we have made no promises. Enough time was given to the patient to ask questions, all of which were answered to the patient's satisfaction. Ms. [name] has indicated that she wanted to continue with the procedure. Attestation: I, the ordering provider, attest that I have discussed with the patient the benefits, risks, side-effects, alternatives, likelihood of achieving goals, and potential problems during recovery for the procedure that I have provided informed consent. Date: 01/16/2018  Time: N/A  Pre-Procedure Preparation:  Monitoring: As per clinic protocol. Respiration, ETCO2,  SpO2, BP, heart rate and rhythm monitor placed and checked for adequate function Safety Precautions: Patient was assessed for positional comfort and pressure points before starting the procedure. Time-out: I initiated and conducted the "Time-out" before starting the procedure, as per protocol. The patient was asked to participate by confirming the accuracy of the "Time Out" information. Verification of the correct person, site, and procedure were performed and confirmed by me, the nursing staff, and the patient. "Time-out" conducted as per Joint Commission's Universal Protocol (UP.01.01.01). "Time-out" Date & Time: 01/16/2018; 1018 hrs.  Description of Procedure Process:   Position: Prone Target Area: Caudal Epidural Canal. Approach: Midline approach. Area Prepped: Entire Posterior Sacrococcygeal Region Prepping solution: ChloraPrep (2% chlorhexidine gluconate and 70% isopropyl alcohol) Safety Precautions: Aspiration looking for blood return was conducted prior to all injections. At no point did we inject any substances, as a needle was being advanced. No attempts were made at seeking any paresthesias. Safe injection practices and needle disposal techniques used. Medications properly checked for expiration dates. SDV (single dose vial) medications used. Description of the Procedure: Protocol guidelines were followed. The patient was placed in position over the fluoroscopy table. The target area was identified and the area prepped in the usual manner. Skin desensitized using vapocoolant spray. Skin & deeper tissues infiltrated with local anesthetic. Appropriate amount of time allowed to pass for local anesthetics to take effect. The procedure needles were then advanced to the target area. Proper needle placement secured. Negative aspiration confirmed. Solution injected in intermittent fashion, asking for systemic symptoms every 0.5cc of injectate. The needles were then removed and the area cleansed, making  sure to leave some of the prepping solution back to take advantage of its long term bactericidal properties. Vitals:   01/16/18 1014 01/16/18 1019 01/16/18 1023 01/16/18 1029  BP: 129/77 121/61 122/77 121/68  Pulse:      Resp: 15 (!) 21 16 16   Temp:      SpO2: 99% 99% 99% 97%  Weight:      Height:        Start Time: 1018 hrs. End  Time: 1023 hrs. Materials:  Needle(s) Type: Epidural needle Gauge: 17G Length: 3.5-in Medication(s): Please see orders for medications and dosing details. 8 CC solution made of 5 cc of preservative-free normal saline, 1 cc of Decadron 10 mg/cc, 2 cc of 0.2% ropivacaine. Imaging Guidance (Spinal):  Type of Imaging Technique: Fluoroscopy Guidance (Spinal) Indication(s): Assistance in needle guidance and placement for procedures requiring needle placement in or near specific anatomical locations not easily accessible without such assistance. Exposure Time: Please see nurses notes. Contrast: Before injecting any contrast, we confirmed that the patient did not have an allergy to iodine, shellfish, or radiological contrast. Once satisfactory needle placement was completed at the desired level, radiological contrast was injected. Contrast injected under live fluoroscopy. No contrast complications. See chart for type and volume of contrast used. Fluoroscopic Guidance: I was personally present during the use of fluoroscopy. "Tunnel Vision Technique" used to obtain the best possible view of the target area. Parallax error corrected before commencing the procedure. "Direction-depth-direction" technique used to introduce the needle under continuous pulsed fluoroscopy. Once target was reached, antero-posterior, oblique, and lateral fluoroscopic projection used confirm needle placement in all planes. Images permanently stored in EMR. Interpretation: I personally interpreted the imaging intraoperatively. Adequate needle placement confirmed in multiple planes. Appropriate spread of  contrast into desired area was observed. No evidence of afferent or efferent intravascular uptake. No intrathecal or subarachnoid spread observed. Permanent images saved into the patient's record.  Antibiotic Prophylaxis:   Anti-infectives (From admission, onward)   None     Indication(s): None identified  Post-operative Assessment:  Post-procedure Vital Signs:  Pulse Rate: 60 Temp: 97.8 F (36.6 C) Resp: 16 BP: 121/68 SpO2: 97 %  EBL: None  Complications: No immediate post-treatment complications observed by team, or reported by patient.  Note: The patient tolerated the entire procedure well. A repeat set of vitals were taken after the procedure and the patient was kept under observation following institutional policy, for this type of procedure. Post-procedural neurological assessment was performed, showing return to baseline, prior to discharge. The patient was provided with post-procedure discharge instructions, including a section on how to identify potential problems. Should any problems arise concerning this procedure, the patient was given instructions to immediately contact us, at any time, without hesitation. In any case, we plan to contact the patient by telephone for a follow-up status report regarding this interventional procedure.  Comments:  No additional relevant information. 5 out of 5 strength bilateral lower extremity: Plantar flexion, dorsiflexion, knee flexion, knee extension.  Plan of Care  Patient not experiencing any significant benefit with her current nortriptyline dose.  Have recommended that she increase to 20 milligrams nightly.  Most recent QTC was 450.  Prescription provided below. Patient instructed to resume Xarelto tomorrow afternoon so long as she is not experiencing any new onset lower extremity weakness.  Imaging Orders     DG C-Arm 1-60 Min-No Report Procedure Orders    No procedure(s) ordered today    Medications ordered for  procedure: Meds ordered this encounter  Medications  . iopamidol (ISOVUE-M) 41 % intrathecal injection 10 mL  . ropivacaine (PF) 2 mg/mL (0.2%) (NAROPIN) injection 2 mL  . sodium chloride flush (NS) 0.9 % injection 2 mL  . lidocaine (PF) (XYLOCAINE) 1 % injection 4.5 mL  . dexamethasone (DECADRON) injection 10 mg  . nortriptyline (PAMELOR) 10 MG capsule    Sig: Take 2 capsules (20 mg total) by mouth at bedtime.    Dispense:  60 capsule  Refill:  1   Medications administered: We administered iopamidol, ropivacaine (PF) 2 mg/mL (0.2%), sodium chloride flush, lidocaine (PF), and dexamethasone.  See the medical record for exact dosing, route, and time of administration.  New Prescriptions   No medications on file   Disposition: Discharge home  Discharge Date & Time: 01/16/2018; 1037 hrs.   Physician-requested Follow-up: Return in about 1 month (around 02/13/2018) for Post Procedure Evaluation.  Patient instructed to restart Xarelto tomorrow.  Future Appointments  Date Time Provider [place]  02/19/2018 10:15 AM Gillis Santa, MD [facility] None   Primary Care Physician: Birdie Sons, MD Location: [facility] Note by: Gillis Santa, MD Date: 01/16/2018; Time: 10:40 AM  Disclaimer:  Medicine is not an exact science. The only guarantee in medicine is that nothing is guaranteed. It is important to note that the decision to proceed with this intervention was based on the information collected from the patient. The Data and conclusions were drawn from the patient's questionnaire, the interview, and the physical examination. Because the information was provided in large part by the patient, it cannot be guaranteed that it has not been purposely or unconsciously manipulated. Every effort has been made to obtain as much relevant data as possible for this evaluation. It is important to note that the conclusions that lead to this procedure are derived in  large part from the available data. Always take into account that the treatment will also be dependent on availability of resources and existing treatment guidelines, considered by other Pain Management Practitioners as being common knowledge and practice, at the time of the intervention. For Medico-Legal purposes, it is also important to point out that variation in procedural techniques and pharmacological choices are the acceptable norm. The indications, contraindications, technique, and results of the above procedure should only be interpreted and judged by a Board-Certified Interventional Pain Specialist with extensive familiarity and expertise in the same exact procedure and technique.

## 2018-01-16 NOTE — Patient Instructions (Signed)
Ok to restart Xarelto tomorrow afternoon so long as no new onset weakness of your legs

## 2018-01-16 NOTE — Progress Notes (Signed)
Safety precautions to be maintained throughout the outpatient stay will include: orient to surroundings, keep bed in low position, maintain call bell within reach at all times, provide assistance with transfer out of bed and ambulation.  

## 2018-01-17 ENCOUNTER — Telehealth: Payer: Self-pay | Admitting: *Deleted

## 2018-01-17 NOTE — Telephone Encounter (Signed)
Voicemail left with patient to call our office if there are questions or concerns re; procedure on yesterday.  

## 2018-02-19 ENCOUNTER — Other Ambulatory Visit: Payer: Self-pay

## 2018-02-19 ENCOUNTER — Ambulatory Visit
Payer: PPO | Attending: Student in an Organized Health Care Education/Training Program | Admitting: Student in an Organized Health Care Education/Training Program

## 2018-02-19 ENCOUNTER — Encounter: Payer: Self-pay | Admitting: Student in an Organized Health Care Education/Training Program

## 2018-02-19 VITALS — BP 132/62 | HR 61 | Temp 98.2°F | Resp 16 | Ht 61.0 in | Wt 161.0 lb

## 2018-02-19 DIAGNOSIS — M545 Low back pain: Secondary | ICD-10-CM | POA: Diagnosis present

## 2018-02-19 DIAGNOSIS — M4726 Other spondylosis with radiculopathy, lumbar region: Secondary | ICD-10-CM | POA: Diagnosis not present

## 2018-02-19 DIAGNOSIS — Z7983 Long term (current) use of bisphosphonates: Secondary | ICD-10-CM | POA: Insufficient documentation

## 2018-02-19 DIAGNOSIS — Z7901 Long term (current) use of anticoagulants: Secondary | ICD-10-CM | POA: Diagnosis not present

## 2018-02-19 DIAGNOSIS — I10 Essential (primary) hypertension: Secondary | ICD-10-CM | POA: Diagnosis not present

## 2018-02-19 DIAGNOSIS — Z86718 Personal history of other venous thrombosis and embolism: Secondary | ICD-10-CM | POA: Diagnosis not present

## 2018-02-19 DIAGNOSIS — M810 Age-related osteoporosis without current pathological fracture: Secondary | ICD-10-CM | POA: Insufficient documentation

## 2018-02-19 DIAGNOSIS — Z8249 Family history of ischemic heart disease and other diseases of the circulatory system: Secondary | ICD-10-CM | POA: Insufficient documentation

## 2018-02-19 DIAGNOSIS — M5136 Other intervertebral disc degeneration, lumbar region: Secondary | ICD-10-CM

## 2018-02-19 DIAGNOSIS — R531 Weakness: Secondary | ICD-10-CM | POA: Diagnosis not present

## 2018-02-19 DIAGNOSIS — I482 Chronic atrial fibrillation: Secondary | ICD-10-CM | POA: Insufficient documentation

## 2018-02-19 DIAGNOSIS — Z9841 Cataract extraction status, right eye: Secondary | ICD-10-CM | POA: Insufficient documentation

## 2018-02-19 DIAGNOSIS — M5116 Intervertebral disc disorders with radiculopathy, lumbar region: Secondary | ICD-10-CM | POA: Insufficient documentation

## 2018-02-19 DIAGNOSIS — R0781 Pleurodynia: Secondary | ICD-10-CM | POA: Insufficient documentation

## 2018-02-19 DIAGNOSIS — Z9842 Cataract extraction status, left eye: Secondary | ICD-10-CM | POA: Insufficient documentation

## 2018-02-19 DIAGNOSIS — M5416 Radiculopathy, lumbar region: Secondary | ICD-10-CM

## 2018-02-19 DIAGNOSIS — Z96653 Presence of artificial knee joint, bilateral: Secondary | ICD-10-CM | POA: Diagnosis not present

## 2018-02-19 DIAGNOSIS — I4891 Unspecified atrial fibrillation: Secondary | ICD-10-CM | POA: Insufficient documentation

## 2018-02-19 DIAGNOSIS — M47816 Spondylosis without myelopathy or radiculopathy, lumbar region: Secondary | ICD-10-CM | POA: Diagnosis not present

## 2018-02-19 DIAGNOSIS — G894 Chronic pain syndrome: Secondary | ICD-10-CM | POA: Diagnosis not present

## 2018-02-19 DIAGNOSIS — Z87891 Personal history of nicotine dependence: Secondary | ICD-10-CM | POA: Insufficient documentation

## 2018-02-19 DIAGNOSIS — Z886 Allergy status to analgesic agent status: Secondary | ICD-10-CM | POA: Diagnosis not present

## 2018-02-19 DIAGNOSIS — Z8673 Personal history of transient ischemic attack (TIA), and cerebral infarction without residual deficits: Secondary | ICD-10-CM | POA: Insufficient documentation

## 2018-02-19 DIAGNOSIS — Z86010 Personal history of colonic polyps: Secondary | ICD-10-CM | POA: Diagnosis not present

## 2018-02-19 DIAGNOSIS — Z808 Family history of malignant neoplasm of other organs or systems: Secondary | ICD-10-CM | POA: Insufficient documentation

## 2018-02-19 DIAGNOSIS — Z79899 Other long term (current) drug therapy: Secondary | ICD-10-CM | POA: Diagnosis not present

## 2018-02-19 DIAGNOSIS — R569 Unspecified convulsions: Secondary | ICD-10-CM | POA: Insufficient documentation

## 2018-02-19 NOTE — Progress Notes (Signed)
Safety precautions to be maintained throughout the outpatient stay will include: orient to surroundings, keep bed in low position, maintain call bell within reach at all times, provide assistance with transfer out of bed and ambulation.  

## 2018-02-19 NOTE — Patient Instructions (Addendum)
____________________________________________________________________________________________  Preparing for your procedure (without sedation)  Instructions: . Oral Intake: Do not eat or drink anything for at least 3 hours prior to your procedure. . Transportation: Unless otherwise stated by your physician, you may drive yourself after the procedure. . Blood Pressure Medicine: Take your blood pressure medicine with a sip of water the morning of the procedure. . Blood thinners:  . Diabetics on insulin: Notify the staff so that you can be scheduled 1st case in the morning. If your diabetes requires high dose insulin, take only  of your normal insulin dose the morning of the procedure and notify the staff that you have done so. . Preventing infections: Shower with an antibacterial soap the morning of your procedure.  . Build-up your immune system: Take 1000 mg of Vitamin C with every meal (3 times a day) the day prior to your procedure. [place] Antibiotics: Inform the staff if you have a condition or reason that requires you to take antibiotics before dental procedures. . Pregnancy: If you are pregnant, call and cancel the procedure. . Sickness: If you have a cold, fever, or any active infections, call and cancel the procedure. . Arrival: You must be in the facility at least 30 minutes prior to your scheduled procedure. . Children: Do not bring any children with you. . Dress appropriately: Bring dark clothing that you would not mind if they get stained. . Valuables: Do not bring any jewelry or valuables.  Procedure appointments are reserved for interventional treatments only. [place] No Prescription Refills. . No medication changes will be discussed during procedure appointments. . No disability issues will be discussed.  Remember:  Regular Business hours are:  Monday to Thursday 8:00 AM to 4:00 PM  Provider's Schedule: Milinda Pointer, MD:  Procedure days: Tuesday and Thursday 7:30 AM to 4:00  PM  Gillis Santa, MD:  Procedure days: Monday and Wednesday 7:30 AM to 4:00 PM ____________________________________________________________________________________________  Stop Xarelto 3 days before your procedure.

## 2018-02-19 NOTE — Progress Notes (Signed)
Patient's Name: [name] [name]  MRN: [id number]  Referring Provider: Birdie Sons, MD  DOB: 02-13-1941  PCP: Birdie Sons, MD  DOS: 02/19/2018  Note by: Gillis Santa, MD  Service setting: Ambulatory outpatient  Specialty: Interventional Pain Management  Location: [facility] (AMB) Pain Management Facility    Patient type: Established   Primary Reason(s) for Visit: Encounter for post-procedure evaluation of chronic illness with mild to moderate exacerbation CC: Back Pain (lower, worse in right side)  HPI  [name] is a 77 y.o. year old, female patient, who comes today for a post-procedure evaluation. 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 Her primarily concern today is the Back Pain (lower, worse in right side)  Pain Assessment: Location: Lower Back Radiating: both buttocks, right upper leg Onset: More than a month ago Duration: Chronic pain Quality: Aching, Discomfort, Pressure(hot, electricity) Severity: 8 /10 (self-reported pain score)  Note: Reported level is inconsistent with clinical observations.                         When using our objective Pain Scale, levels between 6 and 10/10 are said to belong in an emergency room, as it progressively worsens from a 6/10, described as severely limiting, requiring emergency care not usually available at an outpatient pain management facility. At a 6/10 level, communication becomes difficult and requires great effort. Assistance to reach the emergency department may be required. Facial flushing and profuse sweating along with potentially dangerous increases in heart rate and blood pressure will be evident. Effect on ADL:   Timing: Intermittent Modifying  factors: sitting, topical Lidocaine, icy/hot, Tylenol  [name] comes in today  for a post-procedure evaluation after the treatment done on 01/16/2018. for post-procedure evaluation after the treatment done on 01/16/2018.  Further details on both, my assessment(s), as well as the proposed treatment plan, please see below.  Post-Procedure Assessment  01/16/2018 Procedure: Caudal ESI #2 Pre-procedure pain score:  9/10 Post-procedure pain score: 1/10         Influential Factors: BMI: 30.42 kg/m Intra-procedural challenges: None observed.         Assessment challenges: None detected.              Reported side-effects: None.        Post-procedural adverse reactions or complications: None reported         Sedation: Please see nurses note. When no sedatives are used, the analgesic levels obtained are directly associated to the effectiveness of the local anesthetics. However, when sedation is provided, the level of analgesia obtained during the initial 1 hour following the intervention, is believed to be the result of a combination of factors. These factors may include, but are not limited to: 1. The effectiveness of the local anesthetics used. 2. The effects of the analgesic(s) and/or anxiolytic(s) used. 3. The degree of discomfort experienced by the patient at the time of the procedure. 4. The patients ability and reliability in recalling and recording the events. 5. The presence and influence of possible secondary gains and/or psychosocial factors. Reported result: Relief experienced during the 1st hour after the procedure: 90 % (Ultra-Short Term Relief)            Interpretative annotation: Clinically appropriate result. Analgesia during this period is likely to be Local Anesthetic and/or  IV Sedative (Analgesic/Anxiolytic) related.          Effects of local anesthetic: The analgesic effects attained during this period are directly associated to the localized infiltration of local anesthetics and therefore cary significant diagnostic value as to the  etiological location, or anatomical origin, of the pain. Expected duration of relief is directly dependent on the pharmacodynamics of the local anesthetic used. Long-acting (4-6 hours) anesthetics used.  Reported result: Relief during the next 4 to 6 hour after the procedure: 90 % (Short-Term Relief)            Interpretative annotation: Clinically appropriate result. Analgesia during this period is likely to be Local Anesthetic-related.          Long-term benefit: Defined as the period of time past the expected duration of local anesthetics (1 hour for short-acting and 4-6 hours for long-acting). With the possible exception of prolonged sympathetic blockade from the local anesthetics, benefits during this period are typically attributed to, or associated with, other factors such as analgesic sensory neuropraxia, antiinflammatory effects, or beneficial biochemical changes provided by agents other than the local anesthetics.  Reported result: Extended relief following procedure: 75 %(lasted 3 weeks) (Long-Term Relief)            Interpretative annotation: Clinically appropriate result. Good relief. No permanent benefit expected. Inflammation plays a part in the etiology to the pain.          Current benefits: Defined as reported results that persistent at this point in time.   Analgesia: 50-75 %            Function: Somewhat improved ROM: Somewhat improved Interpretative annotation: Recurrence of symptoms. No permanent benefit expected. Effective therapeutic approach.          Interpretation: Results would suggest a successful diagnostic and therapeutic intervention. We'll proceed with the next treatment, as soon as convenient          Plan:  Repeat treatment or therapy and compare extent and duration of benefits.                Laboratory Chemistry  Inflammation Markers (CRP: Acute Phase) (ESR: Chronic Phase) Lab Results  Component Value Date   CRP <0.8 04/11/2017   ESRSEDRATE 8 04/11/2017                          Rheumatology Markers No results found for: RF, ANA, Therisa D[place], [facility]                      Renal Function Markers Lab Results  Component Value Date   BUN 15 04/25/2017   CREATININE 0.59 04/25/2017   GFRAA >60 04/25/2017   GFRNONAA >60 04/25/2017                              Hepatic Function Markers Lab Results  Component Value Date   AST 26 04/11/2017   ALT 22 04/11/2017   ALBUMIN 4.5 04/11/2017   ALKPHOS 40 04/11/2017                        Electrolytes Lab Results  Component Value Date   NA 137 04/25/2017   K 4.0 04/25/2017   CL 104 04/25/2017   CALCIUM 8.7 (L) 04/25/2017   MG 2.4 (H) 09/27/2015   PHOS 3.6 12/29/2015  Neuropathy Markers Lab Results  Component Value Date   HGBA1C 5.4 08/25/2013                        Bone Pathology Markers Lab Results  Component Value Date   VD25OH 39.0 08/02/2016                         Coagulation Parameters Lab Results  Component Value Date   INR 1.4 05/11/2017   LABPROT 22.8 (H) 04/23/2017   APTT 34 04/11/2017   PLT 150 04/25/2017                        Cardiovascular Markers Lab Results  Component Value Date   CKTOTAL 95 09/27/2015   CKMB 1.7 08/24/2013   TROPONINI <0.01 09/27/2015   HGB 11.5 (L) 04/25/2017   HCT 34.1 (L) 04/25/2017                         CA Markers No results found for: CEA, CA125, LABCA2                      Note: Lab results reviewed.  Recent Diagnostic Imaging Results  DG C-Arm 1-60 Min-No Report Fluoroscopy was utilized by the requesting physician.  No radiographic  interpretation.   Complexity Note: Imaging results reviewed. Results shared with Ms. Dicicco, using Layman's terms.                         Meds   Current Outpatient Medications:  .  acetaminophen (TYLENOL) 650 MG CR tablet, Take 650 mg by mouth every 8 (eight) hours as needed for pain., Disp: , Rfl:  .  alendronate (FOSAMAX) 70 MG tablet,  Take 1 tablet once a week by mouth., Disp: , Rfl:  .  Artificial Tear Solution (SOOTHE XP XTRA PROTECTION OP), Place 1 drop into both eyes 2 (two) times daily as needed., Disp: , Rfl:  .  B Complex-Biotin-FA (EQL B COMPLEX 100) TABS, Take 0.4 mg tablet once daily by mouth, Disp: , Rfl:  .  Calcium Citrate-Vitamin D 200-250 MG-UNIT TABS, Take 2 tabs by mouth each morning and 1 tab by mouth in evening., Disp: , Rfl:  .  cholecalciferol (VITAMIN D) 1000 units tablet, Take 1,000 Units by mouth daily., Disp: , Rfl:  .  Coenzyme Q10 (CO Q-10) 100 MG CAPS, Take 100 mg by mouth daily. , Disp: , Rfl:  .  diltiazem (CARDIZEM CD) 240 MG 24 hr capsule, Take 240 mg by mouth daily., Disp: , Rfl:  .  Lactobacillus (ACIDOPHILUS PO), Take 1 tablet by mouth at bedtime. , Disp: , Rfl:  .  lidocaine (ASPERCREME W/LIDOCAINE) 4 % cream, Apply 1 application topically daily as needed. Apply to affected area for pain, Disp: , Rfl:  .  MEGARED OMEGA-3 KRILL OIL PO, Take 300 mg by mouth., Disp: , Rfl:  .  metoprolol tartrate (LOPRESSOR) 25 MG tablet, Take 25 mg by mouth daily. , Disp: , Rfl:  .  nortriptyline (PAMELOR) 10 MG capsule, Take 2 capsules (20 mg total) by mouth at bedtime., Disp: 60 capsule, Rfl: 1 .  oxybutynin (DITROPAN) 5 MG tablet, Take 1 tablet (5 mg total) by mouth 2 (two) times daily., Disp: 180 tablet, Rfl: 3 .  XARELTO 20 MG TABS tablet, TAKE ONE TABLEY BY MOUTH DAILY  WITH SUPPER, Disp: 90 tablet, Rfl: 4  ROS  Constitutional: Denies any fever or chills Gastrointestinal: No reported hemesis, hematochezia, vomiting, or acute GI distress Musculoskeletal: Denies any acute onset joint swelling, redness, loss of ROM, or weakness Neurological: No reported episodes of acute onset apraxia, aphasia, dysarthria, agnosia, amnesia, paralysis, loss of coordination, or loss of consciousness  Allergies  Ms. Amero is allergic to aspirin.  [place]  Drug: Ms. Reimers  reports that she does not use  drugs. Alcohol:  reports that she does not drink alcohol. Tobacco:  reports that she quit smoking about 45 years ago. She has never used smokeless tobacco. Medical:  has a past medical history of Arthritis, Bladder disorder, Chicken pox, Chronic a-fib ([place]), DVT (deep venous thrombosis) ([place]), Dyspnea, Dysrhythmia, Edema, Hay fever, Heart murmur, Hypertension, Irregular heart beat, Seizure ([place]), Stroke ([place]), and Unspecified atrial fibrillation ([place]). Surgical: Ms. Lybrand  has a past surgical history that includes ablation (2007); Tonsillectomy; bilateral wrist surgery (2003); Colonoscopy; Breast surgery; Cataract extraction w/PHACO (Right, 06/01/2015); Cataract extraction w/PHACO (Left, 06/22/2015); Knee Arthroplasty (Right, 03/01/2016); Breast biopsy (Left); Total knee arthroplasty (Right); Joint replacement; Dilation and curettage of uterus; Knee Arthroplasty (Left, 04/23/2017); and Ablation. Family: family history includes Arthritis in her paternal grandmother; Atrial fibrillation in her mother; Congestive Heart Failure in her mother; Diabetes in her paternal grandfather; Melanoma in her mother; Stroke in her maternal grandmother and mother.  Constitutional Exam  General appearance: Well nourished, well developed, and well hydrated. In no apparent acute distress Vitals:   02/19/18 1036  BP: 132/62  Pulse: 61  Resp: 16  Temp: 98.2 F (36.8 C)  TempSrc: Oral  SpO2: 98%  Weight: 161 lb (73 kg)  Height: _0  (1.549 m)   BMI Assessment: Estimated body mass index is 30.42 kg/m as calculated from the following:   Height as of this encounter: _1  (1.549 m).   Weight as of this encounter: 161 lb (73 kg).  BMI interpretation table: BMI level Category Range association with higher incidence of chronic pain  <18 kg/m2 Underweight   18.5-24.9 kg/m2 Ideal body weight   25-29.9 kg/m2 Overweight Increased incidence by 20%  30-34.9 kg/m2 Obese (Class I) Increased incidence by 68%  35-39.9 kg/m2  Severe obesity (Class II) Increased incidence by 136%  >40 kg/m2 Extreme obesity (Class III) Increased incidence by 254%   Patient's current BMI Ideal Body weight  Body mass index is 30.42 kg/m. Ideal body weight: 47.8 kg (105 lb 6.1 oz) Adjusted ideal body weight: 57.9 kg (127 lb 10 oz)   BMI Readings from Last 4 Encounters:  02/19/18 30.42 kg/m  01/16/18 30.23 kg/m  12/26/17 30.23 kg/m  12/05/17 30.10 kg/m   Wt Readings from Last 4 Encounters:  02/19/18 161 lb (73 kg)  01/16/18 160 lb (72.6 kg)  12/26/17 160 lb (72.6 kg)  12/05/17 159 lb 4.8 oz (72.3 kg)  Psych/Mental status: Alert, oriented x 3 (person, place, & time)       Eyes: PERLA Respiratory: No evidence of acute respiratory distress  Cervical Spine Area Exam  Skin & Axial Inspection: No masses, redness, edema, swelling, or associated skin lesions Alignment: Symmetrical Functional ROM: Unrestricted ROM      Stability: No instability detected Muscle Tone/Strength: Functionally intact. No obvious neuro-muscular anomalies detected. Sensory (Neurological): Unimpaired Palpation: No palpable anomalies              Upper Extremity (UE) Exam    Side: Right upper extremity  Side: Left upper extremity  Skin & Extremity Inspection: Skin color, temperature, and hair growth are WNL. No peripheral edema or cyanosis. No masses, redness, swelling, asymmetry, or associated skin lesions. No contractures.  Skin & Extremity Inspection: Skin color, temperature, and hair growth are WNL. No peripheral edema or cyanosis. No masses, redness, swelling, asymmetry, or associated skin lesions. No contractures.  Functional ROM: Unrestricted ROM          Functional ROM: Unrestricted ROM          Muscle Tone/Strength: Functionally intact. No obvious neuro-muscular anomalies detected.  Muscle Tone/Strength: Functionally intact. No obvious neuro-muscular anomalies detected.  Sensory (Neurological): Unimpaired          Sensory (Neurological):  Unimpaired          Palpation: No palpable anomalies              Palpation: No palpable anomalies              Specialized Test(s): Deferred         Specialized Test(s): Deferred          Thoracic Spine Area Exam  Skin & Axial Inspection: No masses, redness, or swelling Alignment: Symmetrical Functional ROM: Unrestricted ROM Stability: No instability detected Muscle Tone/Strength: Functionally intact. No obvious neuro-muscular anomalies detected. Sensory (Neurological): Unimpaired Muscle strength & Tone: No palpable anomalies  Lumbar Spine Area Exam  Skin & Axial Inspection: No masses, redness, or swelling Alignment: Symmetrical Functional ROM: Decreased ROM       Stability: No instability detected Muscle Tone/Strength: Functionally intact. No obvious neuro-muscular anomalies detected. Sensory (Neurological): Dermatomal pain pattern Palpation: No palpable anomalies       Provocative Tests: Lumbar Hyperextension and rotation test: Positive bilaterally for facet joint pain. Lumbar Lateral bending test: Positive ipsilateral radicular pain, bilaterally. Positive for bilateral foraminal stenosis. Patrick's Maneuver: evaluation deferred today                    Gait & Posture Assessment  Ambulation: Unassisted Gait: Relatively normal for age and body habitus Posture: WNL   Lower Extremity Exam    Side: Right lower extremity  Side: Left lower extremity  Skin & Extremity Inspection: Skin color, temperature, and hair growth are WNL. No peripheral edema or cyanosis. No masses, redness, swelling, asymmetry, or associated skin lesions. No contractures.  Skin & Extremity Inspection: Skin color, temperature, and hair growth are WNL. No peripheral edema or cyanosis. No masses, redness, swelling, asymmetry, or associated skin lesions. No contractures.  Functional ROM: Unrestricted ROM          Functional ROM: Unrestricted ROM          Muscle Tone/Strength: Functionally intact. No obvious  neuro-muscular anomalies detected.  Muscle Tone/Strength: Functionally intact. No obvious neuro-muscular anomalies detected.  Sensory (Neurological): Unimpaired  Sensory (Neurological): Unimpaired  Palpation: No palpable anomalies  Palpation: No palpable anomalies   Assessment  Primary Diagnosis & Pertinent Problem List: The primary encounter diagnosis was Lumbar radiculopathy. Diagnoses of Chronic pain syndrome, Lumbar degenerative disc disease, Status post total bilateral knee replacement, Rib pain on left side, and Lumbar spondylosis were also pertinent to this visit.  Status Diagnosis  Responding Persistent Persistent 1. Lumbar radiculopathy   2. Chronic pain syndrome   3. Lumbar degenerative disc disease   4. Status post total bilateral knee replacement   5. Rib pain on left side   6. Lumbar spondylosis      General Recommendations: The pain condition that the patient suffers from is best  treated with a multidisciplinary approach that involves an increase in physical activity to prevent de-conditioning and worsening of the pain cycle, as well as psychological counseling (formal and/or informal) to address the co-morbid psychological affects of pain. Treatment will often involve judicious use of pain medications and interventional procedures to decrease the pain, allowing the patient to participate in the physical activity that will ultimately produce long-lasting pain reductions. The goal of the multidisciplinary approach is to return the patient to a higher level of overall function and to restore their ability to perform activities of daily living.  77 year old female who presents with chronic pain of her axial lumbar spine, bilateral buttocks, secondary to lumbar spondylosis, lumbar degenerative disc disease, chronic lumbar radiculopathy and knee pain secondary to bilateral knee osteoarthritis status post right and left knee replacement surgery (right May 2017, left July 2018).    Patient returns for follow-up status post caudal epidural steroid injection performed on 01/16/2018 (caudal ESI #1 was on 12/05/2017).  Patient endorses greater benefit after caudal ESI #2 compared to ESI #1.  She notes approximately 75% pain relief in regards to her lower extremity radicular symptoms after this previous caudal ESI with ongoing benefit.  She does note return of her pain however not to the extent that it was prior to her block.  We discussed repeating her caudal ESI #3.  We also discussed total steroid exposure over the course of the year and generally we will limit her epidural injections to no more than 4 times a year.  Risks and benefits of caudal ESI were discussed and patient would like to proceed.  Patient instructed to stop Xarelto 3 days prior to her scheduled procedure.  Patient has received cardiac clearance in the past.  Also okay for patient to take low-dose oxycodone as needed for breakthrough pain.  Plan: -Repeat caudal ESI #3.  Patient instructed to stop Xarelto 3 days prior to scheduled procedure. -Okay to take low-dose oxycodone 5 mg as needed for breakthrough pain.  Patient has prescription at home.  Can take over at next visit. -Discussed strategies for bowel health-including high-fiber diet, hydration, stool softener, PRN laxative  Lab-work, procedure(s), and/or referral(s): Orders Placed This Encounter  Procedures  . Caudal Epidural Injection   Previous medication trials: Gabapentin (balance issues, cognitive issues), Cymbalta (balance issues, cognitive issues)   Provider-requested follow-up: Return in about 2 weeks (around 03/05/2018). Time Note: Greater than 50% of the 25 minute(s) of face-to-face time spent with Ms. Ellenberger, was spent in counseling/coordination of care regarding: the treatment plan, treatment alternatives, the risks and possible complications of proposed treatment, the results, interpretation and significance of  her recent diagnostic  interventional treatment(s) and realistic expectations. No future appointments.  Primary Care Physician: Birdie Sons, MD Location: [facility] Note by: Gillis Santa, M.D Date: 02/19/2018; Time: 11:02 AM  There are no Patient Instructions on file for this visit.

## 2018-03-04 ENCOUNTER — Other Ambulatory Visit: Payer: Self-pay | Admitting: Family Medicine

## 2018-03-04 ENCOUNTER — Ambulatory Visit
Admission: RE | Admit: 2018-03-04 | Discharge: 2018-03-04 | Disposition: A | Discharge status: Home / Self Care | Payer: PPO | Source: Ambulatory Visit | Attending: Student in an Organized Health Care Education/Training Program | Admitting: Student in an Organized Health Care Education/Training Program

## 2018-03-04 ENCOUNTER — Other Ambulatory Visit: Payer: Self-pay

## 2018-03-04 ENCOUNTER — Ambulatory Visit (HOSPITAL_BASED_OUTPATIENT_CLINIC_OR_DEPARTMENT_OTHER): Payer: PPO | Admitting: Student in an Organized Health Care Education/Training Program

## 2018-03-04 ENCOUNTER — Encounter: Payer: Self-pay | Admitting: Student in an Organized Health Care Education/Training Program

## 2018-03-04 VITALS — BP 151/105 | HR 84 | Temp 98.0°F | Resp 18 | Ht 61.0 in | Wt 160.0 lb

## 2018-03-04 DIAGNOSIS — Z9841 Cataract extraction status, right eye: Secondary | ICD-10-CM | POA: Insufficient documentation

## 2018-03-04 DIAGNOSIS — M5416 Radiculopathy, lumbar region: Secondary | ICD-10-CM | POA: Diagnosis not present

## 2018-03-04 DIAGNOSIS — Z886 Allergy status to analgesic agent status: Secondary | ICD-10-CM | POA: Diagnosis not present

## 2018-03-04 DIAGNOSIS — Z7901 Long term (current) use of anticoagulants: Secondary | ICD-10-CM | POA: Insufficient documentation

## 2018-03-04 DIAGNOSIS — Z96653 Presence of artificial knee joint, bilateral: Secondary | ICD-10-CM | POA: Insufficient documentation

## 2018-03-04 DIAGNOSIS — Z79899 Other long term (current) drug therapy: Secondary | ICD-10-CM | POA: Diagnosis not present

## 2018-03-04 MED ORDER — LIDOCAINE HCL (PF) 1 % IJ SOLN
4.5000 mL | Freq: Once | INTRAMUSCULAR | Status: AC
  Administered 2018-03-04: 5 mL
  Filled 2018-03-04: qty 5

## 2018-03-04 MED ORDER — IOPAMIDOL (ISOVUE-M 200) INJECTION 41%
10.0000 mL | Freq: Once | INTRAMUSCULAR | Status: AC
  Administered 2018-03-04: 10 mL via EPIDURAL
  Filled 2018-03-04: qty 10

## 2018-03-04 MED ORDER — DEXAMETHASONE SODIUM PHOSPHATE 10 MG/ML IJ SOLN
10.0000 mg | Freq: Once | INTRAMUSCULAR | Status: AC
  Administered 2018-03-04: 10 mg
  Filled 2018-03-04: qty 1

## 2018-03-04 MED ORDER — ROPIVACAINE HCL 2 MG/ML IJ SOLN
2.0000 mL | Freq: Once | INTRAMUSCULAR | Status: AC
  Administered 2018-03-04: 10 mL via EPIDURAL
  Filled 2018-03-04: qty 10

## 2018-03-04 MED ORDER — SODIUM CHLORIDE 0.9% FLUSH
2.0000 mL | Freq: Once | INTRAVENOUS | Status: AC
  Administered 2018-03-04: 10 mL

## 2018-03-04 NOTE — Progress Notes (Signed)
Safety precautions to be maintained throughout the outpatient stay will include: orient to surroundings, keep bed in low position, maintain call bell within reach at all times, provide assistance with transfer out of bed and ambulation.  

## 2018-03-04 NOTE — Progress Notes (Signed)
Patient's Name: [name] [name]  MRN: [id number]  Referring Provider: Birdie Sons, MD  DOB: [birth year]  PCP: Birdie Sons, MD  DOS: 03/04/2018  Note by: Gillis Santa, MD  Service setting: Ambulatory outpatient  Specialty: Interventional Pain Management  Patient type: Established  Location: [facility] (AMB) Pain Management Facility  Visit type: Interventional Procedure   Primary Reason for Visit: Interventional Pain Management Treatment. CC: Back Pain (lower)  Procedure:  Anesthesia, Analgesia, Anxiolysis:  Type: Diagnostic Epidural Steroid Injection #3 Region: Caudal Level: Sacrococcygeal   Laterality: Midline aiming at the right  Type: Local Anesthesia Local Anesthetic: Lidocaine 1% Route: Infiltration (Schofield/IM) IV Access: Declined Sedation: Declined  Indication(s): Analgesia and Anxiety   Indications: 1. Lumbar radiculopathy    Pain Score: Pre-procedure: 7 /10 Post-procedure: 0-No pain/10  Patient stopped Xarelto 3 days ago.  Pre-op Assessment:  [name] [name] is a 77 y.o. (year old), female patient, seen today for interventional treatment. She  has a past surgical history that includes ablation (2007); Tonsillectomy; bilateral wrist surgery (2003); Colonoscopy; Breast surgery; Cataract extraction w/PHACO (Right, 06/01/2015); Cataract extraction w/PHACO (Left, 06/22/2015); Knee Arthroplasty (Right, 03/01/2016); Breast biopsy (Left); Total knee arthroplasty (Right); Joint replacement; Dilation and curettage of uterus; Knee Arthroplasty (Left, 04/23/2017); and Ablation. [name] [name] has a current medication list which includes the following prescription(s): acetaminophen, alendronate, artificial tear solution, eql b complex 100, calcium citrate-vitamin d, cholecalciferol, co q-10, lactobacillus, lidocaine, krill oil, metoprolol tartrate, nortriptyline, oxybutynin, turmeric, diltiazem, and xarelto. Her primarily concern today is the Back Pain (lower)  Initial Vital Signs:  Pulse  Rate: 84 Temp: 98 F (36.7 C) Resp: 16 BP: (!) 140/94 SpO2: 97 %  BMI: Estimated body mass index is 30.23 kg/m as calculated from the following:   Height as of this encounter: 5\' 1"  (1.549 m).   Weight as of this encounter: 160 lb (72.6 kg).  Risk Assessment: Allergies: Reviewed. She is allergic to aspirin.  Allergy Precautions: None required Coagulopathies: Reviewed. None identified.  Blood-thinner therapy: None at this time Active Infection(s): Reviewed. None identified. [name] [name] is afebrile  Site Confirmation: [name] [name] was asked to confirm the procedure and laterality before marking the site Procedure checklist: Completed Consent: Before the procedure and under the influence of no sedative(s), amnesic(s), or anxiolytics, the patient was informed of the treatment options, risks and possible complications. To fulfill our ethical and legal obligations, as recommended by the American Medical Association's Code of Ethics, I have informed the patient of my clinical impression; the nature and purpose of the treatment or procedure; the risks, benefits, and possible complications of the intervention; the alternatives, including doing nothing; the risk(s) and benefit(s) of the alternative treatment(s) or procedure(s); and the risk(s) and benefit(s) of doing nothing. The patient was provided information about the general risks and possible complications associated with the procedure. These may include, but are not limited to: failure to achieve desired goals, infection, bleeding, organ or nerve damage, allergic reactions, paralysis, and death. In addition, the patient was informed of those risks and complications associated to Spine-related procedures, such as failure to decrease pain; infection (i.e.: Meningitis, epidural or intraspinal abscess); bleeding (i.e.: epidural hematoma, subarachnoid hemorrhage, or any other type of intraspinal or peri-dural bleeding); organ or nerve damage  (i.e.: Any type of peripheral nerve, nerve root, or spinal cord injury) with subsequent damage to sensory, motor, and/or autonomic systems, resulting in permanent pain, numbness, and/or weakness of one or several areas of the body; allergic reactions; (i.e.: anaphylactic reaction); and/or death.  Furthermore, the patient was informed of those risks and complications associated with the medications. These include, but are not limited to: allergic reactions (i.e.: anaphylactic or anaphylactoid reaction(s)); adrenal axis suppression; blood sugar elevation that in diabetics may result in ketoacidosis or comma; water retention that in patients with history of congestive heart failure may result in shortness of breath, pulmonary edema, and decompensation with resultant heart failure; weight gain; swelling or edema; medication-induced neural toxicity; particulate matter embolism and blood vessel occlusion with resultant organ, and/or nervous system infarction; and/or aseptic necrosis of one or more joints. Finally, the patient was informed that Medicine is not an exact science; therefore, there is also the possibility of unforeseen or unpredictable risks and/or possible complications that may result in a catastrophic outcome. The patient indicated having understood very clearly. We have given the patient no guarantees and we have made no promises. Enough time was given to the patient to ask questions, all of which were answered to the patient's satisfaction. [name] [name] has indicated that she wanted to continue with the procedure. Attestation: I, the ordering provider, attest that I have discussed with the patient the benefits, risks, side-effects, alternatives, likelihood of achieving goals, and potential problems during recovery for the procedure that I have provided informed consent. Date: 03/04/2018  Time: N/A  Pre-Procedure Preparation:  Monitoring: As per clinic protocol. Respiration, ETCO2, SpO2, BP, heart  rate and rhythm monitor placed and checked for adequate function Safety Precautions: Patient was assessed for positional comfort and pressure points before starting the procedure. Time-out: I initiated and conducted the "Time-out" before starting the procedure, as per protocol. The patient was asked to participate by confirming the accuracy of the "Time Out" information. Verification of the correct person, site, and procedure were performed and confirmed by me, the nursing staff, and the patient. "Time-out" conducted as per Joint Commission's Universal Protocol (UP.01.01.01). "Time-out" Date & Time: 03/04/2018; 1119 hrs.  Description of Procedure Process:   Position: Prone Target Area: Caudal Epidural Canal. Approach: Midline approach. Area Prepped: Entire Posterior Sacrococcygeal Region Prepping solution: ChloraPrep (2% chlorhexidine gluconate and 70% isopropyl alcohol) Safety Precautions: Aspiration looking for blood return was conducted prior to all injections. At no point did we inject any substances, as a needle was being advanced. No attempts were made at seeking any paresthesias. Safe injection practices and needle disposal techniques used. Medications properly checked for expiration dates. SDV (single dose vial) medications used. Description of the Procedure: Protocol guidelines were followed. The patient was placed in position over the fluoroscopy table. The target area was identified and the area prepped in the usual manner. Skin desensitized using vapocoolant spray. Skin & deeper tissues infiltrated with local anesthetic. Appropriate amount of time allowed to pass for local anesthetics to take effect. The procedure needles were then advanced to the target area. Proper needle placement secured. Negative aspiration confirmed. Solution injected in intermittent fashion, asking for systemic symptoms every 0.5cc of injectate. The needles were then removed and the area cleansed, making sure to leave  some of the prepping solution back to take advantage of its long term bactericidal properties. Vitals:   03/04/18 1023 03/04/18 1119 03/04/18 1123 03/04/18 1129  BP: (!) 140/94 (!) 147/103 (!) 148/82 (!) 151/105  Pulse: 84     Resp: 16 20 16 18   Temp: 98 F (36.7 C)     TempSrc: Oral     SpO2: 97% 97% 97% 97%  Weight: 160 lb (72.6 kg)     Height: 5\' 1"  (1.549  m)       Start Time: 1119 hrs. End Time: 1125 hrs. Materials:  Needle(s) Type: Epidural needle Gauge: 17G Length: 3.5-in Medication(s): Please see orders for medications and dosing details. 9 CC solution made of 6 cc of preservative-free normal saline, 1 cc of Decadron 10 mg/cc, 2 cc of 0.2% ropivacaine. Imaging Guidance (Spinal):  Type of Imaging Technique: Fluoroscopy Guidance (Spinal) Indication(s): Assistance in needle guidance and placement for procedures requiring needle placement in or near specific anatomical locations not easily accessible without such assistance. Exposure Time: Please see nurses notes. Contrast: Before injecting any contrast, we confirmed that the patient did not have an allergy to iodine, shellfish, or radiological contrast. Once satisfactory needle placement was completed at the desired level, radiological contrast was injected. Contrast injected under live fluoroscopy. No contrast complications. See chart for type and volume of contrast used. Fluoroscopic Guidance: I was personally present during the use of fluoroscopy. "Tunnel Vision Technique" used to obtain the best possible view of the target area. Parallax error corrected before commencing the procedure. "Direction-depth-direction" technique used to introduce the needle under continuous pulsed fluoroscopy. Once target was reached, antero-posterior, oblique, and lateral fluoroscopic projection used confirm needle placement in all planes. Images permanently stored in EMR. Interpretation: I personally interpreted the imaging intraoperatively. Adequate  needle placement confirmed in multiple planes. Appropriate spread of contrast into desired area was observed. No evidence of afferent or efferent intravascular uptake. No intrathecal or subarachnoid spread observed. Permanent images saved into the patient's record.  Antibiotic Prophylaxis:   Anti-infectives (From admission, onward)   None     Indication(s): None identified  Post-operative Assessment:  Post-procedure Vital Signs:  Pulse Rate: 84 Temp: 98 F (36.7 C) Resp: 18 BP: (!) 151/105 SpO2: 97 %  EBL: None  Complications: No immediate post-treatment complications observed by team, or reported by patient.  Note: The patient tolerated the entire procedure well. A repeat set of vitals were taken after the procedure and the patient was kept under observation following institutional policy, for this type of procedure. Post-procedural neurological assessment was performed, showing return to baseline, prior to discharge. The patient was provided with post-procedure discharge instructions, including a section on how to identify potential problems. Should any problems arise concerning this procedure, the patient was given instructions to immediately contact us, at any time, without hesitation. In any case, we plan to contact the patient by telephone for a follow-up status report regarding this interventional procedure.  Comments:  No additional relevant information. 5 out of 5 strength bilateral lower extremity: Plantar flexion, dorsiflexion, knee flexion, knee extension.  Plan of Care   Patient instructed to resume Xarelto tomorrow afternoon so long as she is not experiencing any new onset lower extremity weakness.  Clinic staff will call patient tomorrow to ensure that she is doing okay.  She can restart Xarelto thereafter.  Imaging Orders     DG C-Arm 1-60 Min-No Report Procedure Orders    No procedure(s) ordered today    Medications ordered for procedure: Meds ordered this  encounter  Medications  . iopamidol (ISOVUE-M) 41 % intrathecal injection 10 mL  . ropivacaine (PF) 2 mg/mL (0.2%) (NAROPIN) injection 2 mL  . sodium chloride flush (NS) 0.9 % injection 2 mL  . dexamethasone (DECADRON) injection 10 mg  . lidocaine (PF) (XYLOCAINE) 1 % injection 4.5 mL   Medications administered: We administered iopamidol, ropivacaine (PF) 2 mg/mL (0.2%), sodium chloride flush, dexamethasone, and lidocaine (PF).  See the medical record for exact  dosing, route, and time of administration.  New Prescriptions   No medications on file   Disposition: Discharge home  Discharge Date & Time: 03/04/2018; 1135 hrs.   Physician-requested Follow-up: Return in about 1 month (around 04/01/2018) for Post Procedure Evaluation.  Patient instructed to restart Xarelto tomorrow.  Future Appointments  Date Time Provider Media  04/09/2018 10:15 AM Gillis Santa, MD [facility] None   Primary Care Physician: Birdie Sons, MD Location: [facility] Facility Note by: Gillis Santa, MD Date: 03/04/2018; Time: 1:23 PM  Disclaimer:  Medicine is not an exact science. The only guarantee in medicine is that nothing is guaranteed. It is important to note that the decision to proceed with this intervention was based on the information collected from the patient. The Data and conclusions were drawn from the patient's questionnaire, the interview, and the physical examination. Because the information was provided in large part by the patient, it cannot be guaranteed that it has not been purposely or unconsciously manipulated. Every effort has been made to obtain as much relevant data as possible for this evaluation. It is important to note that the conclusions that lead to this procedure are derived in large part from the available data. Always take into account that the treatment will also be dependent on availability of resources and existing treatment guidelines, considered  by other Pain Management Practitioners as being common knowledge and practice, at the time of the intervention. For Medico-Legal purposes, it is also important to point out that variation in procedural techniques and pharmacological choices are the acceptable norm. The indications, contraindications, technique, and results of the above procedure should only be interpreted and judged by a Board-Certified Interventional Pain Specialist with extensive familiarity and expertise in the same exact procedure and technique.

## 2018-03-04 NOTE — Patient Instructions (Signed)
You may restart your Xarelto tomorrow afternoon. Pain Management Discharge Instructions  General Discharge Instructions :  If you need to reach your doctor call: Monday-Friday 8:00 am - 4:00 pm at [phone] or toll free [phone].  After clinic hours [phone] to have operator reach doctor.  Bring all of your medication bottles to all your appointments in the pain clinic.  To cancel or reschedule your appointment with Pain Management please remember to call 24 hours in advance to avoid a fee.  Refer to the educational materials which you have been given on: General Risks, I had my Procedure. Discharge Instructions, Post Sedation.  Post Procedure Instructions:   Please notify your doctor immediately if you have any unusual bleeding, trouble breathing or pain that is not related to your normal pain.  Depending on the type of procedure that was done, some parts of your body may feel week and/or numb.  This usually clears up by tonight or the next day.  Walk with the use of an assistive device or accompanied by an adult for the 24 hours.  You may use ice on the affected area for the first 24 hours.  Put ice in a Ziploc bag and cover with a towel and place against area 15 minutes on 15 minutes off.  You may switch to heat after 24 hours.

## 2018-03-05 ENCOUNTER — Telehealth: Payer: Self-pay | Admitting: *Deleted

## 2018-03-05 NOTE — Telephone Encounter (Signed)
Attempted to call for post procedure follow-up. Message left. 

## 2018-03-19 ENCOUNTER — Other Ambulatory Visit: Payer: Self-pay | Admitting: Family Medicine

## 2018-04-02 ENCOUNTER — Other Ambulatory Visit: Payer: Self-pay | Admitting: Family Medicine

## 2018-04-02 ENCOUNTER — Other Ambulatory Visit: Payer: Self-pay | Admitting: Student in an Organized Health Care Education/Training Program

## 2018-04-04 ENCOUNTER — Telehealth: Payer: Self-pay | Admitting: *Deleted

## 2018-04-04 NOTE — Telephone Encounter (Signed)
Needs script for Nortriptyline. Last written 01-16-18.

## 2018-04-08 ENCOUNTER — Other Ambulatory Visit: Payer: Self-pay | Admitting: Student in an Organized Health Care Education/Training Program

## 2018-04-08 MED ORDER — NORTRIPTYLINE HCL 10 MG PO CAPS: 20.0000 mg | ORAL_CAPSULE | Freq: Every day | ORAL | 3 refills | Status: DC

## 2018-04-08 NOTE — Telephone Encounter (Signed)
Dr. Holley Raring,  Will you do this?

## 2018-04-08 NOTE — Progress Notes (Signed)
Patient notified

## 2018-04-09 ENCOUNTER — Ambulatory Visit: Payer: PPO | Admitting: Student in an Organized Health Care Education/Training Program

## 2018-04-26 ENCOUNTER — Telehealth: Payer: Self-pay

## 2018-04-26 MED ORDER — TRAMADOL HCL 50 MG PO TABS: 50.0000 mg | ORAL_TABLET | ORAL | 0 refills | Status: DC | PRN

## 2018-04-26 NOTE — Telephone Encounter (Signed)
Pt requesting refill of Tramadol. Not currently on med list. She uses [facility], and is requesting a delivery. Contract information is correct.

## 2018-04-26 NOTE — Telephone Encounter (Signed)
Ok to refill Tramadol? Please advise, Thanks!

## 2018-05-13 ENCOUNTER — Other Ambulatory Visit: Payer: Self-pay

## 2018-05-13 ENCOUNTER — Ambulatory Visit
Payer: PPO | Attending: Student in an Organized Health Care Education/Training Program | Admitting: Student in an Organized Health Care Education/Training Program

## 2018-05-13 ENCOUNTER — Encounter: Payer: Self-pay | Admitting: Student in an Organized Health Care Education/Training Program

## 2018-05-13 VITALS — BP 138/57 | HR 88 | Temp 98.8°F | Resp 16 | Ht 61.0 in | Wt 160.0 lb

## 2018-05-13 DIAGNOSIS — R569 Unspecified convulsions: Secondary | ICD-10-CM | POA: Diagnosis not present

## 2018-05-13 DIAGNOSIS — R531 Weakness: Secondary | ICD-10-CM | POA: Insufficient documentation

## 2018-05-13 DIAGNOSIS — M79672 Pain in left foot: Secondary | ICD-10-CM | POA: Diagnosis not present

## 2018-05-13 DIAGNOSIS — M5116 Intervertebral disc disorders with radiculopathy, lumbar region: Secondary | ICD-10-CM | POA: Insufficient documentation

## 2018-05-13 DIAGNOSIS — G894 Chronic pain syndrome: Secondary | ICD-10-CM | POA: Diagnosis not present

## 2018-05-13 DIAGNOSIS — M810 Age-related osteoporosis without current pathological fracture: Secondary | ICD-10-CM | POA: Diagnosis not present

## 2018-05-13 DIAGNOSIS — M545 Low back pain: Secondary | ICD-10-CM | POA: Diagnosis not present

## 2018-05-13 DIAGNOSIS — I10 Essential (primary) hypertension: Secondary | ICD-10-CM | POA: Diagnosis not present

## 2018-05-13 DIAGNOSIS — Z8673 Personal history of transient ischemic attack (TIA), and cerebral infarction without residual deficits: Secondary | ICD-10-CM | POA: Diagnosis not present

## 2018-05-13 DIAGNOSIS — M7989 Other specified soft tissue disorders: Secondary | ICD-10-CM | POA: Diagnosis not present

## 2018-05-13 DIAGNOSIS — Z79899 Other long term (current) drug therapy: Secondary | ICD-10-CM | POA: Diagnosis not present

## 2018-05-13 DIAGNOSIS — M170 Bilateral primary osteoarthritis of knee: Secondary | ICD-10-CM | POA: Diagnosis not present

## 2018-05-13 DIAGNOSIS — Z96653 Presence of artificial knee joint, bilateral: Secondary | ICD-10-CM | POA: Diagnosis not present

## 2018-05-13 DIAGNOSIS — M79671 Pain in right foot: Secondary | ICD-10-CM | POA: Diagnosis not present

## 2018-05-13 DIAGNOSIS — M5136 Other intervertebral disc degeneration, lumbar region: Secondary | ICD-10-CM | POA: Diagnosis not present

## 2018-05-13 DIAGNOSIS — Z86010 Personal history of colonic polyps: Secondary | ICD-10-CM | POA: Diagnosis not present

## 2018-05-13 DIAGNOSIS — M5416 Radiculopathy, lumbar region: Secondary | ICD-10-CM | POA: Diagnosis not present

## 2018-05-13 DIAGNOSIS — Z87891 Personal history of nicotine dependence: Secondary | ICD-10-CM | POA: Diagnosis not present

## 2018-05-13 DIAGNOSIS — I4891 Unspecified atrial fibrillation: Secondary | ICD-10-CM | POA: Diagnosis not present

## 2018-05-13 DIAGNOSIS — Z886 Allergy status to analgesic agent status: Secondary | ICD-10-CM | POA: Insufficient documentation

## 2018-05-13 NOTE — Progress Notes (Signed)
Safety precautions to be maintained throughout the outpatient stay will include: orient to surroundings, keep bed in low position, maintain call bell within reach at all times, provide assistance with transfer out of bed and ambulation.  

## 2018-05-13 NOTE — Progress Notes (Signed)
Patient's Name: [name] [name]  MRN: [id number]  Referring Provider: Birdie Sons, MD  DOB: Jul 28, 1941  PCP: Birdie Sons, MD  DOS: 05/13/2018  Note by: Gillis Santa, MD  Service setting: Ambulatory outpatient  Specialty: Interventional Pain Management  Location: [facility] (AMB) Pain Management Facility    Patient type: Established   Primary Reason(s) for Visit: Encounter for post-procedure evaluation of chronic illness with mild to moderate exacerbation CC: Foot Pain (bilateral)  is  HPI  [name] [name] is a 77 y.o. year old, female patient, who comes today for a post-procedure evaluation. 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 Her primarily concern today is the Foot Pain (bilateral)  Pain Assessment: Location: Right, Left Foot Radiating: denies Onset: More than a month ago Duration: Chronic pain Quality: Sharp Severity: 0-No pain/10 (subjective, self-reported pain score)  Note: Reported level is compatible with observation.                         When using our objective Pain Scale, levels between 6 and 10/10 are said to belong in an emergency room, as it progressively worsens from a 6/10, described as severely limiting, requiring emergency care not usually available at an outpatient pain management facility. At a 6/10 level, communication becomes difficult and requires great effort. Assistance to reach the emergency department may be required. Facial flushing and profuse sweating along with potentially dangerous increases in heart rate and blood pressure will be evident. Effect on ADL:   Timing: Constant Modifying factors:  nothing BP: (!) 138/57  HR: 88  [name] [name] comes in today for post-procedure evaluation after the treatment done on 04/04/2018.  Further details on both, my assessment(s), as well as the proposed treatment plan, please see below.  Post-Procedure Assessment  04/02/2018 Procedure: Caudal Pre-procedure pain score:  7/10 Post-procedure pain score: 0/10         Influential Factors: BMI: 30.23 kg/m Intra-procedural challenges: None observed.         Assessment challenges: None detected.              Reported side-effects: None.        Post-procedural adverse reactions or complications: None reported         Sedation: Please see nurses note. When no sedatives are used, the analgesic levels obtained are directly associated to the effectiveness of the local anesthetics. However, when sedation is provided, the level of analgesia obtained during the initial 1 hour following the intervention, is believed to be the result of a combination of factors. These factors may include, but are not limited to: 1. The effectiveness of the local anesthetics used. 2. The effects of the analgesic(s) and/or anxiolytic(s) used. 3. The degree of discomfort experienced by the patient at the time of the procedure. 4. The patients ability and reliability in recalling and recording the events. 5. The presence and influence of possible secondary gains and/or psychosocial factors. Reported result: Relief experienced during the 1st hour after the procedure: 100 % (Ultra-Short Term Relief)            Interpretative annotation: Clinically appropriate result. Analgesia during this period is likely to be Local Anesthetic and/or IV Sedative (Analgesic/Anxiolytic)  related.          Effects of local anesthetic: The analgesic effects attained during this period are directly associated to the localized infiltration of local anesthetics and therefore cary significant diagnostic value as to the etiological location, or anatomical  origin, of the pain. Expected duration of relief is directly dependent on the pharmacodynamics of the local anesthetic used. Long-acting (4-6 hours) anesthetics used.  Reported result: Relief during the next 4 to 6 hour after the procedure: 100 % (Short-Term Relief)            Interpretative annotation: Clinically appropriate result. Analgesia during this period is likely to be Local Anesthetic-related.          Long-term benefit: Defined as the period of time past the expected duration of local anesthetics (1 hour for short-acting and 4-6 hours for long-acting). With the possible exception of prolonged sympathetic blockade from the local anesthetics, benefits during this period are typically attributed to, or associated with, other factors such as analgesic sensory neuropraxia, antiinflammatory effects, or beneficial biochemical changes provided by agents other than the local anesthetics.  Reported result: Extended relief following procedure: 90 %(lasted 6 weeks) (Long-Term Relief)            Interpretative annotation: Clinically appropriate result. Good relief. No permanent benefit expected. Inflammation plays a part in the etiology to the pain.          Current benefits: Defined as reported results that persistent at this point in time.   Analgesia: 75-100 %            Function: [name] [name] reports improvement in function ROM: [name] [name] reports improvement in ROM Interpretative annotation: Ongoing benefit. Therapeutic success. Effective diagnostic intervention.          Interpretation: Results would suggest a successful diagnostic and therapeutic intervention.                  Plan:  Please see "Plan of Care" for details.                Laboratory Chemistry  Inflammation Markers (CRP: Acute Phase) (ESR: Chronic Phase) Lab Results  Component Value Date   CRP <0.8 04/11/2017   ESRSEDRATE 8 04/11/2017                         Rheumatology Markers No results found for: RF, ANA,  LABURIC, URICUR, LYMEIGGIGMAB, LYMEABIGMQN, HLAB27                      Renal Function Markers Lab Results  Component Value Date   BUN 15 04/25/2017   CREATININE 0.59 04/25/2017   BCR 25 08/02/2016   GFRAA >60 04/25/2017   GFRNONAA >60 04/25/2017                             Hepatic Function Markers Lab Results  Component Value Date   AST 26 04/11/2017   ALT 22 04/11/2017   ALBUMIN 4.5 04/11/2017   ALKPHOS 40 04/11/2017                        Electrolytes Lab Results  Component Value Date   NA 137 04/25/2017   K 4.0 04/25/2017   CL 104 04/25/2017   CALCIUM 8.7 (L) 04/25/2017   MG 2.4 (H) 09/27/2015   PHOS 3.6 12/29/2015  Neuropathy Markers Lab Results  Component Value Date   HGBA1C 5.4 08/25/2013                        Bone Pathology Markers Lab Results  Component Value Date   VD25OH 39.0 08/02/2016                         Coagulation Parameters Lab Results  Component Value Date   INR 1.4 05/11/2017   LABPROT 22.8 (H) 04/23/2017   APTT 34 04/11/2017   PLT 150 04/25/2017                        Cardiovascular Markers Lab Results  Component Value Date   CKTOTAL 95 09/27/2015   CKMB 1.7 08/24/2013   TROPONINI <0.01 09/27/2015   HGB 11.5 (L) 04/25/2017   HCT 34.1 (L) 04/25/2017                         CA Markers No results found for: CEA, CA125, LABCA2                      Note: Lab results reviewed.  Recent Diagnostic Imaging Results  DG C-Arm 1-60 Min-No Report Fluoroscopy was utilized by the requesting physician.  No radiographic  interpretation.   Complexity Note: Imaging results reviewed. Results shared with [name] [name], using Layman's terms.                         Meds   Current Outpatient Medications:  .  acetaminophen (TYLENOL) 650 MG CR tablet, Take 650 mg by mouth every 8 (eight) hours as needed for pain., Disp: , Rfl:  .  alendronate (FOSAMAX) 70 MG tablet, Take 1 tablet once a week by mouth., Disp: , Rfl:   .  Artificial Tear Solution (SOOTHE XP XTRA PROTECTION OP), Place 1 drop into both eyes 2 (two) times daily as needed., Disp: , Rfl:  .  B Complex-Biotin-FA (EQL B COMPLEX 100) TABS, Take 0.4 mg tablet once daily by mouth, Disp: , Rfl:  .  Calcium Citrate-Vitamin D 200-250 MG-UNIT TABS, Take 2 tabs by mouth each morning and 1 tab by mouth in evening., Disp: , Rfl:  .  cholecalciferol (VITAMIN D) 1000 units tablet, Take 1,000 Units by mouth daily., Disp: , Rfl:  .  Coenzyme Q10 (CO Q-10) 100 MG CAPS, Take 100 mg by mouth daily. , Disp: , Rfl:  .  diltiazem (CARDIZEM CD) 240 MG 24 hr capsule, TAKE 1 CAPSULE BY MOUTH EVERY DAY., Disp: 90 capsule, Rfl: 4 .  Lactobacillus (ACIDOPHILUS PO), Take 1 tablet by mouth at bedtime. , Disp: , Rfl:  .  lidocaine (ASPERCREME W/LIDOCAINE) 4 % cream, Apply 1 application topically daily as needed. Apply to affected area for pain, Disp: , Rfl:  .  MEGARED OMEGA-3 KRILL OIL PO, Take 300 mg by mouth., Disp: , Rfl:  .  metoprolol tartrate (LOPRESSOR) 25 MG tablet, TAKE 1 TABLET BY MOUTH TWICE A DAY., Disp: 180 tablet, Rfl: 4 .  oxybutynin (DITROPAN) 5 MG tablet, Take 1 tablet (5 mg total) by mouth 2 (two) times daily., Disp: 180 tablet, Rfl: 3 .  traMADol (ULTRAM) 50 MG tablet, Take 1-2 tablets (50-100 mg total) by mouth every 4 (four) hours as needed for moderate pain., Disp: 60 tablet, Rfl: 0 .  TURMERIC PO, Take 800 mg by mouth 2 (two) times daily., Disp: , Rfl:  .  XARELTO 20 MG TABS tablet, TAKE 1 TABLET BY MOUTH DAILY WITH SUPPER, Disp: 90 tablet, Rfl: 4 .  nortriptyline (PAMELOR) 10 MG capsule, Take 2 capsules (20 mg total) by mouth at bedtime. (Patient not taking: Reported on 05/13/2018), Disp: 60 capsule, Rfl: 3  ROS  Constitutional: Denies any fever or chills Gastrointestinal: No reported hemesis, hematochezia, vomiting, or acute GI distress Musculoskeletal: Denies any acute onset joint swelling, redness, loss of ROM, or weakness Neurological: No reported  episodes of acute onset apraxia, aphasia, dysarthria, agnosia, amnesia, paralysis, loss of coordination, or loss of consciousness  Allergies  [name] [name] is allergic to aspirin.  [place]  Drug: [name] [name]  reports that she does not use drugs. Alcohol:  reports that she does not drink alcohol. Tobacco:  reports that she quit smoking about 46 years ago. She has never used smokeless tobacco. Medical:  has a past medical history of Arthritis, Bladder disorder, Chicken pox, Chronic a-fib ([place]), DVT (deep venous thrombosis) ([place]), Dyspnea, Dysrhythmia, Edema, Hay fever, Heart murmur, Hypertension, Irregular heart beat, Seizure ([place]), Stroke ([place]), and Unspecified atrial fibrillation ([place]). Surgical: [name] [name]  has a past surgical history that includes ablation (2007); Tonsillectomy; bilateral wrist surgery (2003); Colonoscopy; Breast surgery; Cataract extraction w/PHACO (Right, 06/01/2015); Cataract extraction w/PHACO (Left, 06/22/2015); Knee Arthroplasty (Right, 03/01/2016); Breast biopsy (Left); Total knee arthroplasty (Right); Joint replacement; Dilation and curettage of uterus; Knee Arthroplasty (Left, 04/23/2017); and Ablation. Family: family history includes Arthritis in her paternal grandmother; Atrial fibrillation in her mother; Congestive Heart Failure in her mother; Diabetes in her paternal grandfather; Melanoma in her mother; Stroke in her maternal grandmother and mother.  Constitutional Exam  General appearance: Well nourished, well developed, and well hydrated. In no apparent acute distress Vitals:   05/13/18 1307 05/13/18 1309  BP: (!) 154/104 (!) 138/57  Pulse: 88   Resp: 16   Temp: 98.8 F (37.1 C)   TempSrc: Oral   SpO2: 98%   Weight: 160 lb (72.6 kg)   Height: '5\' 1"'  (1.549 m)    BMI Assessment: Estimated body mass index is 30.23 kg/m as calculated from the following:   Height as of this encounter: '5\' 1"'  (1.549 m).   Weight as of this encounter: 160 lb (72.6 kg).  BMI  interpretation table: BMI level Category Range association with higher incidence of chronic pain  <18 kg/m2 Underweight   18.5-24.9 kg/m2 Ideal body weight   25-29.9 kg/m2 Overweight Increased incidence by 20%  30-34.9 kg/m2 Obese (Class I) Increased incidence by 68%  35-39.9 kg/m2 Severe obesity (Class II) Increased incidence by 136%  >40 kg/m2 Extreme obesity (Class III) Increased incidence by 254%   Patient's current BMI Ideal Body weight  Body mass index is 30.23 kg/m. Ideal body weight: 47.8 kg (105 lb 6.1 oz) Adjusted ideal body weight: 57.7 kg (127 lb 3.7 oz)   BMI Readings from Last 4 Encounters:  05/13/18 30.23 kg/m  03/04/18 30.23 kg/m  02/19/18 30.42 kg/m  01/16/18 30.23 kg/m   Wt Readings from Last 4 Encounters:  05/13/18 160 lb (72.6 kg)  03/04/18 160 lb (72.6 kg)  02/19/18 161 lb (73 kg)  01/16/18 160 lb (72.6 kg)  Psych/Mental status: Alert, oriented x 3 (person, place, & time)       Eyes: PERLA Respiratory: No evidence of acute respiratory distress  Cervical Spine Area Exam  Skin & Axial Inspection: No masses, redness,  edema, swelling, or associated skin lesions Alignment: Symmetrical Functional ROM: Unrestricted ROM      Stability: No instability detected Muscle Tone/Strength: Functionally intact. No obvious neuro-muscular anomalies detected. Sensory (Neurological): Unimpaired Palpation: No palpable anomalies              Upper Extremity (UE) Exam    Side: Right upper extremity  Side: Left upper extremity  Skin & Extremity Inspection: Skin color, temperature, and hair growth are WNL. No peripheral edema or cyanosis. No masses, redness, swelling, asymmetry, or associated skin lesions. No contractures.  Skin & Extremity Inspection: Skin color, temperature, and hair growth are WNL. No peripheral edema or cyanosis. No masses, redness, swelling, asymmetry, or associated skin lesions. No contractures.  Functional ROM: Unrestricted ROM          Functional ROM:  Unrestricted ROM          Muscle Tone/Strength: Functionally intact. No obvious neuro-muscular anomalies detected.  Muscle Tone/Strength: Functionally intact. No obvious neuro-muscular anomalies detected.  Sensory (Neurological): Unimpaired          Sensory (Neurological): Unimpaired          Palpation: No palpable anomalies              Palpation: No palpable anomalies              Provocative Test(s):  Phalen's test: deferred Tinel's test: deferred Apley's scratch test (touch opposite shoulder):  Action 1 (Across chest): deferred Action 2 (Overhead): deferred Action 3 (LB reach): deferred   Provocative Test(s):  Phalen's test: deferred Tinel's test: deferred Apley's scratch test (touch opposite shoulder):  Action 1 (Across chest): deferred Action 2 (Overhead): deferred Action 3 (LB reach): deferred    Thoracic Spine Area Exam  Skin & Axial Inspection: No masses, redness, or swelling Alignment: Symmetrical Functional ROM: Unrestricted ROM Stability: No instability detected Muscle Tone/Strength: Functionally intact. No obvious neuro-muscular anomalies detected. Sensory (Neurological): Unimpaired Muscle strength & Tone: No palpable anomalies  Lumbar Spine Area Exam  Skin & Axial Inspection: No masses, redness, or swelling Alignment: Symmetrical Functional ROM: Unrestricted ROM       Stability: No instability detected Muscle Tone/Strength: Functionally intact. No obvious neuro-muscular anomalies detected. Sensory (Neurological): Improved Palpation: No palpable anomalies       Provocative Tests: Lumbar Hyperextension/rotation test: Improved after treatment       Lumbar quadrant test (Kemp's test): Improved after treatment       Lumbar Lateral bending test: Improved after treatment       Patrick's Maneuver: deferred today                   FABER test: deferred today                   Thigh-thrust test: deferred today       S-I compression test: deferred today       S-I  distraction test: deferred today        Gait & Posture Assessment  Ambulation: Unassisted Gait: Relatively normal for age and body habitus Posture: WNL   Lower Extremity Exam    Side: Right lower extremity  Side: Left lower extremity  Stability: No instability observed          Stability: No instability observed          Skin & Extremity Inspection: Skin color, temperature, and hair growth are WNL. No peripheral edema or cyanosis. No masses, redness, swelling, asymmetry, or associated skin lesions. No contractures.  Skin & Extremity Inspection: Skin color, temperature, and hair growth are WNL. No peripheral edema or cyanosis. No masses, redness, swelling, asymmetry, or associated skin lesions. No contractures.  Functional ROM: Unrestricted ROM                  Functional ROM: Unrestricted ROM                  Muscle Tone/Strength: Functionally intact. No obvious neuro-muscular anomalies detected.  Muscle Tone/Strength: Functionally intact. No obvious neuro-muscular anomalies detected.  Sensory (Neurological): Unimpaired  Sensory (Neurological): Unimpaired  Palpation: No palpable anomalies  Palpation: No palpable anomalies   Assessment  Primary Diagnosis & Pertinent Problem List: The primary encounter diagnosis was Lumbar radiculopathy. Diagnoses of Chronic pain syndrome and Lumbar degenerative disc disease were also pertinent to this visit.  Status Diagnosis  Improved Improved Persistent 1. Lumbar radiculopathy   2. Chronic pain syndrome   3. Lumbar degenerative disc disease     Problems updated and reviewed during this visit: Problem  Lumbar Radiculopathy  Chronic Pain Syndrome  Lumbar Degenerative Disc Disease  General Recommendations: The pain condition that the patient suffers from is best treated with a multidisciplinary approach that involves an increase in physical activity to prevent de-conditioning and worsening of the pain cycle, as well as psychological counseling (formal  and/or informal) to address the co-morbid psychological affects of pain. Treatment will often involve judicious use of pain medications and interventional procedures to decrease the pain, allowing the patient to participate in the physical activity that will ultimately produce long-lasting pain reductions. The goal of the multidisciplinary approach is to return the patient to a higher level of overall function and to restore their ability to perform activities of daily living.  78 year old female with a history of lumbosacral radiculopathy status post caudal epidural steroid injection #3 who follows up endorsing greater than 75% pain relief in regards to her radicular lower externally symptoms.  Patient notes improvement in pain, range of motion, functional status.  She states that she is able to perform activities of daily living with greater ease and has less overall generalized pain during the course of her day.  Patient is pleased with her results.  Patient will follow-up as needed if and when she has return of her radicular pain.   Provider-requested follow-up: Return if symptoms worsen or fail to improve.  No future appointments.  Primary Care Physician: Birdie Sons, MD Location: [facility] Note by: Gillis Santa, M.D Date: 05/13/2018; Time: 2:56 PM  There are no Patient Instructions on file for this visit.

## 2018-05-30 ENCOUNTER — Telehealth: Payer: Self-pay

## 2018-05-30 NOTE — Telephone Encounter (Signed)
Please advise 

## 2018-05-30 NOTE — Telephone Encounter (Signed)
Left detailed message on pt's vm notifying her samples are ready to pick up.

## 2018-05-30 NOTE — Telephone Encounter (Signed)
Patient lost her bottle of [name] [name] and would like to know if you have any samples she can get Call back number is [phone]

## 2018-05-30 NOTE — Telephone Encounter (Signed)
Can have two bottles of 20mg  tablets if we have them

## 2018-06-03 ENCOUNTER — Telehealth: Payer: Self-pay

## 2018-06-03 NOTE — Telephone Encounter (Signed)
LMTCB and schedule AWV any time after 07/04/18. -MM

## 2018-06-07 NOTE — Telephone Encounter (Signed)
AWE scheduled for 07/17/18

## 2018-06-12 NOTE — Telephone Encounter (Signed)
Noted, thank you.  -MM 

## 2018-06-21 ENCOUNTER — Ambulatory Visit: Payer: PPO | Admitting: Family Medicine

## 2018-06-21 ENCOUNTER — Telehealth: Payer: Self-pay | Admitting: Family Medicine

## 2018-06-21 MED ORDER — AMOXICILLIN 500 MG PO CAPS: ORAL_CAPSULE | ORAL | 0 refills | Status: DC

## 2018-06-21 MED ORDER — TRAMADOL HCL 50 MG PO TABS: 50.0000 mg | ORAL_TABLET | ORAL | 1 refills | Status: DC | PRN

## 2018-06-21 MED ORDER — GABAPENTIN 300 MG PO CAPS: ORAL_CAPSULE | ORAL | 1 refills | Status: DC

## 2018-06-21 NOTE — Telephone Encounter (Signed)
Please advise 

## 2018-06-21 NOTE — Telephone Encounter (Signed)
Pt states she needs antibiotic for dental procedure and Tramadol 50 MG refilled.  Pt also wanting to start Gabapentin back.  States it is the only thing that is helping her feet.  All mediation sent to [facility] for delivery

## 2018-07-08 ENCOUNTER — Other Ambulatory Visit: Payer: Self-pay | Admitting: Family Medicine

## 2018-07-08 MED ORDER — AMOXICILLIN 500 MG PO CAPS: ORAL_CAPSULE | ORAL | 0 refills | Status: DC

## 2018-07-08 NOTE — Telephone Encounter (Signed)
Please advise 

## 2018-07-08 NOTE — Telephone Encounter (Signed)
Pt contacted office for refill request on the following medications:  amoxicillin (AMOXIL) 500 MG capsule  [facility]  Pt stated that she is getting a second opinion about the dental work she is needing and is requesting a new Rx because they won't do the exam without pt taking the antibiotic. Please advise. Thanks TNP

## 2018-07-13 ENCOUNTER — Emergency Department: Payer: PPO

## 2018-07-13 ENCOUNTER — Encounter: Payer: Self-pay | Admitting: Emergency Medicine

## 2018-07-13 ENCOUNTER — Emergency Department
Admission: EM | Admit: 2018-07-13 | Discharge: 2018-07-13 | Disposition: A | Discharge status: Home / Self Care | Payer: PPO | Attending: Emergency Medicine | Admitting: Emergency Medicine

## 2018-07-13 ENCOUNTER — Other Ambulatory Visit: Payer: Self-pay

## 2018-07-13 DIAGNOSIS — Z96653 Presence of artificial knee joint, bilateral: Secondary | ICD-10-CM | POA: Insufficient documentation

## 2018-07-13 DIAGNOSIS — Z87891 Personal history of nicotine dependence: Secondary | ICD-10-CM | POA: Diagnosis not present

## 2018-07-13 DIAGNOSIS — Z8673 Personal history of transient ischemic attack (TIA), and cerebral infarction without residual deficits: Secondary | ICD-10-CM | POA: Diagnosis not present

## 2018-07-13 DIAGNOSIS — Z79899 Other long term (current) drug therapy: Secondary | ICD-10-CM | POA: Diagnosis not present

## 2018-07-13 DIAGNOSIS — M25552 Pain in left hip: Secondary | ICD-10-CM | POA: Diagnosis not present

## 2018-07-13 DIAGNOSIS — S79912A Unspecified injury of left hip, initial encounter: Secondary | ICD-10-CM | POA: Diagnosis not present

## 2018-07-13 DIAGNOSIS — M25512 Pain in left shoulder: Secondary | ICD-10-CM | POA: Diagnosis not present

## 2018-07-13 DIAGNOSIS — I10 Essential (primary) hypertension: Secondary | ICD-10-CM | POA: Insufficient documentation

## 2018-07-13 MED ORDER — DEXAMETHASONE SODIUM PHOSPHATE 10 MG/ML IJ SOLN
10.0000 mg | Freq: Once | INTRAMUSCULAR | Status: AC
  Administered 2018-07-13: 10 mg via INTRAMUSCULAR
  Filled 2018-07-13: qty 1

## 2018-07-13 MED ORDER — HYDROCODONE-ACETAMINOPHEN 7.5-325 MG PO TABS: 1.0000 | ORAL_TABLET | Freq: Three times a day (TID) | ORAL | 0 refills | Status: DC | PRN

## 2018-07-13 MED ORDER — HYDROCODONE-ACETAMINOPHEN 5-325 MG PO TABS
1.0000 | ORAL_TABLET | Freq: Once | ORAL | Status: AC
  Administered 2018-07-13: 1 via ORAL
  Filled 2018-07-13: qty 1

## 2018-07-13 NOTE — Discharge Instructions (Addendum)
You have been diagnosed with acute left hip pain. The xray of your hip shows chronic arthritic changes, but no acute findings. We gave you Norco 5-325 mg in the ER. I have provided you with a RX for Norco 7.5-325 every 8 hours as needed for pain. Follow up with your PCP if symptoms persist or worsen.

## 2018-07-13 NOTE — ED Triage Notes (Signed)
L hip pain x 2 days. States no recent fall however did fall 3 weeks ago.

## 2018-07-13 NOTE — ED Notes (Signed)
Pt ambulated with walker to room with steady gait. Here for lt hip pain x 2 days

## 2018-07-13 NOTE — ED Provider Notes (Signed)
[facility] Emergency Department Provider Note ____________________________________________  Time seen: 1705  I have reviewed the triage vital signs and the nursing notes.  HISTORY  Chief Complaint  Hip Pain   HPI [name] [name] is a 77 y.o. female presents to the ER today with complaint of left hip pain.  She reports this started 2 days ago.  She describes the pain as sharp and stabbing.  The pain radiates down her left leg.  She denies numbness, tingling or weakness of the left lower extremity.  She reports she had a fall 3 weeks ago, but does not recall any injury to the left hip.  She has a history of lumbar radiculopathy for which she takes nortriptyline, gabapentin and tramadol as needed.  She reports these medications have not provided any relief.  She does have a history of osteopenia and is being treated with Fosamax, calcium and vitamin D.  Past Medical History:  Diagnosis Date  . Arthritis    osteo; in B knees;   . Bladder disorder   . Chicken pox   . Chronic a-fib ([place])   . DVT (deep venous thrombosis) ([place])    H/O  . Dyspnea   . Dysrhythmia    a fib  . Edema   . Hay fever   . Heart murmur   . Hypertension    controlled  . Irregular heart beat   . Seizure ([place])    2 years ago knocked unconscious; hospitalized and diagnosed with seizures; no spells in last year;   . Stroke [facility])    1 month ago (possible stroke)  . Unspecified atrial fibrillation [facility])     Patient Active Problem List   Diagnosis Date Noted  . Lumbar radiculopathy 05/13/2018  . Chronic pain syndrome 05/13/2018  . Lumbar degenerative disc disease 05/13/2018  . Osteoporosis 08/02/2016  . History of colon polyps 08/02/2016  . Low back pain 06/20/2016  . S/P total knee arthroplasty 03/01/2016  . Knee osteoarthritis 02/16/2016  . Weakness 09/27/2015  . 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 History of CVA (cerebrovascular accident) 02/16/2014   . Seizures ([place]) 02/16/2014  . Chronic leg pain 02/16/2014  . Fatigue 07/02/2013  . HYPERTENSION, BENIGN 12/07/2010  . ATRIAL FIBRILLATION 06/29/2009    Past Surgical History:  Procedure Laterality Date  . ablation  [place], [place]  . ABLATION     for irregular heart beat  . bilateral wrist surgery  2003  . BREAST BIOPSY Left    stereo  . BREAST SURGERY    . CATARACT EXTRACTION W/PHACO Right 06/01/2015   Procedure: CATARACT EXTRACTION PHACO AND INTRAOCULAR LENS PLACEMENT (IOC);  Surgeon: Birder Robson, MD;  Location: [facility] ORS;  Service: Ophthalmology;  Laterality: Right;  [place]: 01:02.0 AP%: 23.1 CDE: 14.33 Fluid lot# [id number]   . CATARACT EXTRACTION W/PHACO Left 06/22/2015   Procedure: CATARACT EXTRACTION PHACO AND INTRAOCULAR LENS PLACEMENT (IOC);  Surgeon: Birder Robson, MD;  Location: [facility] ORS;  Service: Ophthalmology;  Laterality: Left;  [place]: 01:00.7 AP%: 23.1 CDE: 14.01 Lot # [id number]  . COLONOSCOPY    . DILATION AND CURETTAGE OF UTERUS    . JOINT REPLACEMENT    . KNEE ARTHROPLASTY Right 03/01/2016   Procedure: COMPUTER ASSISTED TOTAL KNEE ARTHROPLASTY;  Surgeon: Dereck Leep, MD;  Location: [facility] ORS;  Service: Orthopedics;  Laterality: Right;  . KNEE ARTHROPLASTY Left 04/23/2017   Procedure: COMPUTER ASSISTED TOTAL KNEE ARTHROPLASTY;  Surgeon: Skip Estimable  P, MD;  Location: [facility] ORS;  Service: Orthopedics;  Laterality: Left;  . TONSILLECTOMY    . TOTAL KNEE ARTHROPLASTY Right     Prior to Admission medications   Medication Sig Start Date End Date Taking? Authorizing Provider  acetaminophen (TYLENOL) 650 MG CR tablet Take 650 mg by mouth every 8 (eight) hours as needed for pain.    [provider]  alendronate (FOSAMAX) 70 MG tablet Take 1 tablet once a week by mouth. 08/10/17   [provider]  amoxicillin (AMOXIL) 500 MG capsule Take 4 tablets one hour before procedure 07/08/18   Birdie Sons, MD  Artificial Tear Solution (SOOTHE XP  XTRA PROTECTION OP) Place 1 drop into both eyes 2 (two) times daily as needed.    [provider]  B Complex-Biotin-FA (EQL B COMPLEX 100) TABS Take 0.4 mg tablet once daily by mouth    [provider]  Calcium Citrate-Vitamin D 200-250 MG-UNIT TABS Take 2 tabs by mouth each morning and 1 tab by mouth in evening.    [provider]  cholecalciferol (VITAMIN D) 1000 units tablet Take 1,000 Units by mouth daily.    [provider]  Coenzyme Q10 (CO Q-10) 100 MG CAPS Take 100 mg by mouth daily.     [provider]  diltiazem (CARDIZEM CD) 240 MG 24 hr capsule TAKE 1 CAPSULE BY MOUTH EVERY DAY. 03/04/18   Birdie Sons, MD  gabapentin (NEURONTIN) 300 MG capsule Take one at bedtime for 4 days, then increase to one twice daily 06/21/18   Birdie Sons, MD  HYDROcodone-acetaminophen (NORCO) 7.5-325 MG tablet Take 1 tablet by mouth every 8 (eight) hours as needed for moderate pain. 07/13/18   Jearld Fenton, NP  Lactobacillus (ACIDOPHILUS PO) Take 1 tablet by mouth at bedtime.     [provider]  lidocaine (ASPERCREME W/LIDOCAINE) 4 % cream Apply 1 application topically daily as needed. Apply to affected area for pain    [provider]  MEGARED OMEGA-3 KRILL OIL PO Take 300 mg by mouth.    [provider]  metoprolol tartrate (LOPRESSOR) 25 MG tablet TAKE 1 TABLET BY MOUTH TWICE A DAY. 04/02/18   Birdie Sons, MD  nortriptyline (PAMELOR) 10 MG capsule Take 2 capsules (20 mg total) by mouth at bedtime. Patient not taking: Reported on 05/13/2018 04/08/18   Gillis Santa, MD  oxybutynin (DITROPAN) 5 MG tablet Take 1 tablet (5 mg total) by mouth 2 (two) times daily. 07/16/17   Birdie Sons, MD  traMADol (ULTRAM) 50 MG tablet Take 1-2 tablets (50-100 mg total) by mouth every 4 (four) hours as needed for moderate pain. 06/21/18   Birdie Sons, MD  TURMERIC PO Take 800 mg by mouth 2 (two) times daily.    [provider]   XARELTO 20 MG TABS tablet TAKE 1 TABLET BY MOUTH DAILY WITH SUPPER 03/19/18   Birdie Sons, MD    Allergies Aspirin  Family History  Problem Relation Age of Onset  . Stroke Mother   . Congestive Heart Failure Mother   . Atrial fibrillation Mother   . Melanoma Mother   . Stroke Maternal Grandmother   . Arthritis Paternal Grandmother        RA  . Diabetes Paternal Grandfather     Social History Social History   Tobacco Use  . Smoking status: Former Smoker    Last attempt to quit: 04/11/1972    Years since quitting:  46.2  . Smokeless tobacco: Never Used  Substance Use Topics  . Alcohol use: No    Alcohol/week: 0.0 standard drinks  . Drug use: No    Review of Systems  Constitutional: Negative for fever. Musculoskeletal: Positive for left hip pain, low back pain (chronic). negative for knee pain. Neurological: Negative for focal weakness, tingling or numbness. ____________________________________________  PHYSICAL EXAM:  VITAL SIGNS: ED Triage Vitals  Enc Vitals Group     BP 07/13/18 1637 (!) 173/77     Pulse Rate 07/13/18 1637 95     Resp 07/13/18 1637 18     Temp 07/13/18 1637 98.1 F (36.7 C)     Temp Source 07/13/18 1637 Oral     SpO2 07/13/18 1637 100 %     Weight 07/13/18 1640 160 lb (72.6 kg)     Height 07/13/18 1640 5' (1.524 m)     Head Circumference --      Peak Flow --      Pain Score 07/13/18 1639 10     Pain Loc --      Pain Edu? --      Excl. in [place]? --     Constitutional: Alert and oriented. Well appearing and in no distress. Cardiovascular: Slightly irregular. Murmur noted. Pedal pulses 2+ bilaterally. Respiratory: Normal respiratory effort. No wheezes/rales/rhonchi. Musculoskeletal: Normal abduction and adduction of the left hip. Normal flexion, extension, internal and external rotation. Pain with palpation over the left trochanteric bursa. Strength 5/5 BLE. Gait slow and steady with use of rolling walker. Neurologic:   No gross focal  neurologic deficits are appreciated. ____________________________________________   RADIOLOGY  Imaging Orders     DG Hip Unilat W or Wo Pelvis 2-3 Views Left IMPRESSION: No fracture or dislocation. Marked lumbar spine scoliosis and degenerative changes. ____________________________________________   INITIAL IMPRESSION / ASSESSMENT AND PLAN / ED COURSE  Acute Left Hip Pain:  Xray left hip negative for acute fracture Likely OA flare, possible bursitis Unable to take NSAID's d/t Xarelto Decadron 10 mg IM today RX for Norco 7.5-325 mg tabs Q8H prn    I reviewed the patient's prescription history over the last 12 months in the multi-state controlled substances database(s) that includes [place], [place], [place], [place], [place], [place], [place], [place], [place], [place], [place], [place], [place], and [place].  Results were notable for Tramadol #60 06/21/18, 04/26/18 by PCP. ____________________________________________  FINAL CLINICAL IMPRESSION(S) / ED DIAGNOSES  Final diagnoses:  Left hip pain      Jearld Fenton, NP 07/13/18 1901    Carrie Mew, MD 07/14/18 [place]

## 2018-07-16 ENCOUNTER — Other Ambulatory Visit: Payer: Self-pay | Admitting: Family Medicine

## 2018-07-16 MED ORDER — AMOXICILLIN 500 MG PO CAPS: ORAL_CAPSULE | ORAL | 0 refills | Status: DC

## 2018-07-16 NOTE — Telephone Encounter (Signed)
Pt is having two procedures done at the dental office.  She will needs the amoxil the dates 9/27 and 9/28.    She uses [employer] back num,ber is [phone]  Thanks  [name]

## 2018-07-16 NOTE — Telephone Encounter (Signed)
Please advise 

## 2018-07-17 ENCOUNTER — Ambulatory Visit (INDEPENDENT_AMBULATORY_CARE_PROVIDER_SITE_OTHER): Payer: PPO

## 2018-07-17 VITALS — BP 122/58 | HR 63 | Temp 98.1°F | Ht 61.0 in | Wt 157.6 lb

## 2018-07-17 DIAGNOSIS — Z23 Encounter for immunization: Secondary | ICD-10-CM | POA: Diagnosis not present

## 2018-07-17 DIAGNOSIS — Z0000 Encounter for general adult medical examination without abnormal findings: Secondary | ICD-10-CM | POA: Diagnosis not present

## 2018-07-17 DIAGNOSIS — Z1211 Encounter for screening for malignant neoplasm of colon: Secondary | ICD-10-CM | POA: Diagnosis not present

## 2018-07-17 NOTE — Patient Instructions (Addendum)
[name] [name] , Thank you for taking time to come for your Medicare Wellness Visit. I appreciate your ongoing commitment to your health goals. Please review the following plan we discussed and let me know if I can assist you in the future.   Screening recommendations/referrals: Colonoscopy: Referral sent today.  Mammogram: Up to date Bone Density: Up to date Recommended yearly ophthalmology/optometry visit for glaucoma screening and checkup Recommended yearly dental visit for hygiene and checkup  Vaccinations: Influenza vaccine: Up to date Pneumococcal vaccine: Up to date Tdap vaccine: Up to date Shingles vaccine: Pt declines today.     Advanced directives: Advance directive discussed with you today. Even though you declined this today please call our office should you change your mind and we can give you the proper paperwork for you to fill out.  Conditions/risks identified: Fall risk prevention.   Next appointment: 09/12/18 @ 10 AM with Dr Caryn Section. Pt declined scheduling an AWV for 2020.    Preventive Care 19 Years and Older, Female Preventive care refers to lifestyle choices and visits with your health care provider that can promote health and wellness. What does preventive care include?  A yearly physical exam. This is also called an annual well check.  Dental exams once or twice a year.  Routine eye exams. Ask your health care provider how often you should have your eyes checked.  Personal lifestyle choices, including:  Daily care of your teeth and gums.  Regular physical activity.  Eating a healthy diet.  Avoiding tobacco and drug use.  Limiting alcohol use.  Practicing safe sex.  Taking low-dose aspirin every day.  Taking vitamin and mineral supplements as recommended by your health care provider. What happens during an annual well check? The services and screenings done by your health care provider during your annual well check will depend on your age,  overall health, lifestyle risk factors, and family history of disease. Counseling  Your health care provider may ask you questions about your:  Alcohol use.  Tobacco use.  Drug use.  Emotional well-being.  Home and relationship well-being.  Sexual activity.  Eating habits.  History of falls.  Memory and ability to understand (cognition).  Work and work Statistician.  Reproductive health. Screening  You may have the following tests or measurements:  Height, weight, and BMI.  Blood pressure.  Lipid and cholesterol levels. These may be checked every 5 years, or more frequently if you are over 64 years old.  Skin check.  Lung cancer screening. You may have this screening every year starting at age 9 if you have a 30-pack-year history of smoking and currently smoke or have quit within the past 15 years.  Fecal occult blood test (FOBT) of the stool. You may have this test every year starting at age 42.  Flexible sigmoidoscopy or colonoscopy. You may have a sigmoidoscopy every 5 years or a colonoscopy every 10 years starting at age 103.  Hepatitis C blood test.  Hepatitis B blood test.  Sexually transmitted disease (STD) testing.  Diabetes screening. This is done by checking your blood sugar (glucose) after you have not eaten for a while (fasting). You may have this done every 1-3 years.  Bone density scan. This is done to screen for osteoporosis. You may have this done starting at age 14.  Mammogram. This may be done every 1-2 years. Talk to your health care provider about how often you should have regular mammograms. Talk with your health care provider about your test  results, treatment options, and if necessary, the need for more tests. Vaccines  Your health care provider may recommend certain vaccines, such as:  Influenza vaccine. This is recommended every year.  Tetanus, diphtheria, and acellular pertussis (Tdap, Td) vaccine. You may need a Td booster every 10  years.  Zoster vaccine. You may need this after age 53.  Pneumococcal 13-valent conjugate (PCV13) vaccine. One dose is recommended after age 15.  Pneumococcal polysaccharide (PPSV23) vaccine. One dose is recommended after age 82. Talk to your health care provider about which screenings and vaccines you need and how often you need them. This information is not intended to replace advice given to you by your health care provider. Make sure you discuss any questions you have with your health care provider. Document Released: 11/05/2015 Document Revised: 06/28/2016 Document Reviewed: 08/10/2015 Elsevier Interactive Patient Education  2017 [place] Prevention in the Home Falls can cause injuries. They can happen to people of all ages. There are many things you can do to make your home safe and to help prevent falls. What can I do on the outside of my home?  Regularly fix the edges of walkways and driveways and fix any cracks.  Remove anything that might make you trip as you walk through a door, such as a raised step or threshold.  Trim any bushes or trees on the path to your home.  Use bright outdoor lighting.  Clear any walking paths of anything that might make someone trip, such as rocks or tools.  Regularly check to see if handrails are loose or broken. Make sure that both sides of any steps have handrails.  Any raised decks and porches should have guardrails on the edges.  Have any leaves, snow, or ice cleared regularly.  Use sand or salt on walking paths during winter.  Clean up any spills in your garage right away. This includes oil or grease spills. What can I do in the bathroom?  Use night lights.  Install grab bars by the toilet and in the tub and shower. Do not use towel bars as grab bars.  Use non-skid mats or decals in the tub or shower.  If you need to sit down in the shower, use a plastic, non-slip stool.  Keep the floor dry. Clean up any water that  spills on the floor as soon as it happens.  Remove soap buildup in the tub or shower regularly.  Attach bath mats securely with double-sided non-slip rug tape.  Do not have throw rugs and other things on the floor that can make you trip. What can I do in the bedroom?  Use night lights.  Make sure that you have a light by your bed that is easy to reach.  Do not use any sheets or blankets that are too big for your bed. They should not hang down onto the floor.  Have a firm chair that has side arms. You can use this for support while you get dressed.  Do not have throw rugs and other things on the floor that can make you trip. What can I do in the kitchen?  Clean up any spills right away.  Avoid walking on wet floors.  Keep items that you use a lot in easy-to-reach places.  If you need to reach something above you, use a strong step stool that has a grab bar.  Keep electrical cords out of the way.  Do not use floor polish or wax that makes  floors slippery. If you must use wax, use non-skid floor wax.  Do not have throw rugs and other things on the floor that can make you trip. What can I do with my stairs?  Do not leave any items on the stairs.  Make sure that there are handrails on both sides of the stairs and use them. Fix handrails that are broken or loose. Make sure that handrails are as long as the stairways.  Check any carpeting to make sure that it is firmly attached to the stairs. Fix any carpet that is loose or worn.  Avoid having throw rugs at the top or bottom of the stairs. If you do have throw rugs, attach them to the floor with carpet tape.  Make sure that you have a light switch at the top of the stairs and the bottom of the stairs. If you do not have them, ask someone to add them for you. What else can I do to help prevent falls?  Wear shoes that:  Do not have high heels.  Have rubber bottoms.  Are comfortable and fit you well.  Are closed at the  toe. Do not wear sandals.  If you use a stepladder:  Make sure that it is fully opened. Do not climb a closed stepladder.  Make sure that both sides of the stepladder are locked into place.  Ask someone to hold it for you, if possible.  Clearly mark and make sure that you can see:  Any grab bars or handrails.  First and last steps.  Where the edge of each step is.  Use tools that help you move around (mobility aids) if they are needed. These include:  Canes.  Walkers.  Scooters.  Crutches.  Turn on the lights when you go into a dark area. Replace any light bulbs as soon as they burn out.  Set up your furniture so you have a clear path. Avoid moving your furniture around.  If any of your floors are uneven, fix them.  If there are any pets around you, be aware of where they are.  Review your medicines with your doctor. Some medicines can make you feel dizzy. This can increase your chance of falling. Ask your doctor what other things that you can do to help prevent falls. This information is not intended to replace advice given to you by your health care provider. Make sure you discuss any questions you have with your health care provider. Document Released: 08/05/2009 Document Revised: 03/16/2016 Document Reviewed: 11/13/2014 Elsevier Interactive Patient Education  2017 [employer].

## 2018-07-17 NOTE — Progress Notes (Signed)
Subjective:   [name] [name] is a 77 y.o. female who presents for Medicare Annual (Subsequent) preventive examination.  Review of Systems:  N/A  Cardiac Risk Factors include: advanced age (>70men, >64 women);hypertension     Objective:     Vitals: BP (!) 122/58 (BP Location: Right Arm)   Pulse 63   Temp 98.1 F (36.7 C) (Oral)   Ht 5\' 1"  (1.549 m)   Wt 157 lb 9.6 oz (71.5 kg)   BMI 29.78 kg/m   Body mass index is 29.78 kg/m.  Advanced Directives 07/17/2018 07/13/2018 03/04/2018 01/16/2018 12/26/2017 08/24/2017 07/04/2017  Does Patient Have a Medical Advance Directive? No No No No No No No  Would patient like information on creating a medical advance directive? No - Patient declined - Yes (MAU/Ambulatory/Procedural Areas - Information given) No - Patient declined No - Patient declined - Yes (ED - Information included in AVS)    Tobacco Social History   Tobacco Use  Smoking Status Former Smoker  . Last attempt to quit: 04/11/1972  . Years since quitting: 46.2  Smokeless Tobacco Never Used     Counseling given: Not Answered   Clinical Intake:  Pre-visit preparation completed: Yes  Pain : 0-10 Pain Score: 3  Pain Type: Chronic pain Pain Location: Back Pain Orientation: Left, Lower Pain Descriptors / Indicators: Stabbing Pain Frequency: Constant     Nutritional Status: BMI 25 -29 Overweight Nutritional Risks: None Diabetes: No  How often do you need to have someone help you when you read instructions, pamphlets, or other written materials from your doctor or pharmacy?: 1 - Never  Interpreter Needed?: No  Information entered by :: [facility], LPN  Past Medical History:  Diagnosis Date  . Arthritis    osteo; in B knees;   . Bladder disorder   . Chicken pox   . Chronic a-fib ([place])   . DVT (deep venous thrombosis) ([place])    H/O  . Dyspnea   . Dysrhythmia    a fib  . Edema   . Hay fever   . Heart murmur   . Hypertension    controlled  . Irregular  heart beat   . Seizure ([place])    2 years ago knocked unconscious; hospitalized and diagnosed with seizures; no spells in last year;   . Stroke [facility])    1 month ago (possible stroke)  . Unspecified atrial fibrillation [facility])    Past Surgical History:  Procedure Laterality Date  . ablation  [place], [place]  . ABLATION     for irregular heart beat  . bilateral wrist surgery  2003  . BREAST BIOPSY Left    stereo  . BREAST SURGERY    . CATARACT EXTRACTION W/PHACO Right 06/01/2015   Procedure: CATARACT EXTRACTION PHACO AND INTRAOCULAR LENS PLACEMENT (IOC);  Surgeon: Birder Robson, MD;  Location: [facility] ORS;  Service: Ophthalmology;  Laterality: Right;  [place]: 01:02.0 AP%: 23.1 CDE: 14.33 Fluid lot# [id number]   . CATARACT EXTRACTION W/PHACO Left 06/22/2015   Procedure: CATARACT EXTRACTION PHACO AND INTRAOCULAR LENS PLACEMENT (IOC);  Surgeon: Birder Robson, MD;  Location: [facility] ORS;  Service: Ophthalmology;  Laterality: Left;  [place]: 01:00.7 AP%: 23.1 CDE: 14.01 Lot # [id number]  . COLONOSCOPY    . DILATION AND CURETTAGE OF UTERUS    . JOINT REPLACEMENT    . KNEE ARTHROPLASTY Right 03/01/2016   Procedure: COMPUTER ASSISTED TOTAL KNEE ARTHROPLASTY;  Surgeon: Dereck Leep, MD;  Location: [facility] ORS;  Service: Orthopedics;  Laterality: Right;  . KNEE ARTHROPLASTY Left 04/23/2017   Procedure: COMPUTER ASSISTED TOTAL KNEE ARTHROPLASTY;  Surgeon: Dereck Leep, MD;  Location: [facility] ORS;  Service: Orthopedics;  Laterality: Left;  . TONSILLECTOMY    . TOTAL KNEE ARTHROPLASTY Right    Family History  Problem Relation Age of Onset  . Stroke Mother   . Congestive Heart Failure Mother   . Atrial fibrillation Mother   . Melanoma Mother   . Stroke Maternal Grandmother   . Arthritis Paternal Grandmother        RA  . Diabetes Paternal Grandfather    Social History   Socioeconomic History  . Marital status: Divorced    Spouse name: Not on file  . Number of children: 2  . Years of education:  Not on file  . Highest education level: Bachelor's degree (e.g., BA, AB, BS)  Occupational History  . Occupation: Retired  Scientific laboratory technician  . Financial resource strain: Not hard at all  . Food insecurity:    Worry: Never true    Inability: Never true  . Transportation needs:    Medical: No    Non-medical: No  Tobacco Use  . Smoking status: Former Smoker    Last attempt to quit: 04/11/1972    Years since quitting: 46.2  . Smokeless tobacco: Never Used  Substance and Sexual Activity  . Alcohol use: No    Alcohol/week: 0.0 standard drinks  . Drug use: No  . Sexual activity: Not on file  Lifestyle  . Physical activity:    Days per week: Not on file    Minutes per session: Not on file  . Stress: Not at all  Relationships  . Social connections:    Talks on phone: Not on file    Gets together: Not on file    Attends religious service: Not on file    Active member of club or organization: Not on file    Attends meetings of clubs or organizations: Not on file    Relationship status: Not on file  Other Topics Concern  . Not on file  Social History Narrative   Retired, divorced, gets regular exercise (Cur[place]- 3x weekly; walks dog)    Outpatient Encounter Medications as of 07/17/2018  Medication Sig  . acetaminophen (TYLENOL) 650 MG CR tablet Take 650 mg by mouth every 8 (eight) hours as needed for pain.  Marland Kitchen alendronate (FOSAMAX) 70 MG tablet Take 1 tablet once a week by mouth.  Marland Kitchen amoxicillin (AMOXIL) 500 MG capsule Take 4 tablets one hour before procedure  . Artificial Tear Solution (SOOTHE XP XTRA PROTECTION OP) Place 1 drop into both eyes 2 (two) times daily as needed.  . B Complex-Biotin-FA (EQL B COMPLEX 100) TABS Take 0.4 mg tablet once daily by mouth  . Calcium-Magnesium-Vitamin D (CALCIUM 1200+D3 PO) Take 1,200 mg by mouth daily.  . cholecalciferol (VITAMIN D) 1000 units tablet Take 1,000 Units by mouth daily.  . Coenzyme Q10 (CO Q-10) 100 MG CAPS Take 100 mg by mouth daily.     Marland Kitchen diltiazem (CARDIZEM CD) 240 MG 24 hr capsule TAKE 1 CAPSULE BY MOUTH EVERY DAY.  Marland Kitchen gabapentin (NEURONTIN) 300 MG capsule Take one at bedtime for 4 days, then increase to one twice daily  . HYDROcodone-acetaminophen (NORCO) 7.5-325 MG tablet Take 1 tablet by mouth every 8 (eight) hours as needed for moderate pain.  . Lactobacillus (ACIDOPHILUS PO) Take 1 tablet by mouth at bedtime.   . lidocaine (ASPERCREME W/LIDOCAINE) 4 %  cream Apply 1 application topically daily as needed. Apply to affected area for pain  . MEGARED OMEGA-3 KRILL OIL PO Take 300 mg by mouth daily.   . metoprolol tartrate (LOPRESSOR) 25 MG tablet TAKE 1 TABLET BY MOUTH TWICE A DAY. (Patient taking differently: Take 25 mg by mouth daily. )  . oxybutynin (DITROPAN) 5 MG tablet Take 1 tablet (5 mg total) by mouth 2 (two) times daily.  . traMADol (ULTRAM) 50 MG tablet Take 1-2 tablets (50-100 mg total) by mouth every 4 (four) hours as needed for moderate pain.  . TURMERIC PO Take 800 mg by mouth 2 (two) times daily.  Alveda Reasons 20 MG TABS tablet TAKE 1 TABLET BY MOUTH DAILY WITH SUPPER  . Calcium Citrate-Vitamin D 200-250 MG-UNIT TABS Take 2 tabs by mouth each morning and 1 tab by mouth in evening.  . nortriptyline (PAMELOR) 10 MG capsule Take 2 capsules (20 mg total) by mouth at bedtime. (Patient not taking: Reported on 05/13/2018)   No facility-administered encounter medications on file as of 07/17/2018.     Activities of Daily Living In your present state of health, do you have any difficulty performing the following activities: 07/17/2018  Hearing? N  Vision? N  Difficulty concentrating or making decisions? Y  Walking or climbing stairs? Y  Comment Due to back pain.   Dressing or bathing? N  Doing errands, shopping? N  Preparing Food and eating ? N  Using the Toilet? N  In the past six months, have you accidently leaked urine? Y  Comment Currently on Oxybutynin.   Do you have problems with loss of bowel control? N   Managing your Medications? N  Managing your Finances? N  Housekeeping or managing your Housekeeping? N  Some recent data might be hidden    Patient Care Team: Birdie Sons, MD as PCP - General (Family Medicine) Vladimir Crofts, MD (Neurology) Minna Merritts, MD as Consulting Physician (Cardiology) Sharlet Salina, MD as Referring Physician (Physical Medicine and Rehabilitation) Birder Robson, MD as Referring Physician (Ophthalmology)    Assessment:   This is a routine wellness examination for Jeniyah.  Exercise Activities and Dietary recommendations Current Exercise Habits: Home exercise routine, Type of exercise: walking(Walks dog 4 times a day for at least 30 minutes at a time. ), Intensity: Mild  Goals    . Eat more fruits and vegetables     Recommend eating more fruits in daily diet and substituting sweets for fruits.     Marland Kitchen LIFESTYLE - DECREASE FALLS RISK     Discussed way to prep home inside and out to help prevent future falls.        Fall Risk Fall Risk  07/17/2018 05/13/2018 03/04/2018 02/19/2018 01/16/2018  Falls in the past year? Yes No Yes No No  Number falls in past yr: 2 or more - 1 - -  Injury with Fall? No - No - -  Follow up Falls prevention discussed - Falls prevention discussed - -   Is the patient's home free of loose throw rugs in walkways, pet beds, electrical cords, etc?   yes      Grab bars in the bathroom? no      Handrails on the stairs?   no      Adequate lighting?   yes  Timed Get Up and Go performed: N/A  Depression Screen PHQ 2/9 Scores 07/17/2018 05/13/2018 03/04/2018 02/19/2018  PHQ - 2 Score 0 0 0 0  PHQ- 9 Score - - - -  Cognitive Function:     6CIT Screen 07/17/2018 07/04/2017  What Year? 0 points 0 points  What month? 0 points 0 points  What time? 0 points 0 points  Count back from 20 0 points 0 points  Months in reverse 0 points 2 points  Repeat phrase 0 points 2 points  Total Score 0 4    Immunization History   Administered Date(s) Administered  . Influenza Split 07/14/2009, 08/02/2010  . Influenza, High Dose Seasonal PF 07/24/2014, 09/22/2015, 08/02/2016, 07/04/2017, 07/17/2018  . Influenza,inj,Quad PF,6+ Mos 07/04/2013  . PPD Test 03/04/2016  . Pneumococcal Conjugate-13 07/24/2014  . Pneumococcal Polysaccharide-23 07/14/2009  . Td 06/16/2008  . Tdap 01/05/2011  . Zoster 11/29/2011    Qualifies for Shingles Vaccine? Due for Shingles vaccine. Declined my offer to administer today. Education has been provided regarding the importance of this vaccine. Pt has been advised to call her insurance company to determine her out of pocket expense. Advised she may also receive this vaccine at her local pharmacy or Health Dept. Verbalized acceptance and understanding.  Screening Tests Health Maintenance  Topic Date Due  . COLONOSCOPY  01/16/2017  . TETANUS/TDAP  01/04/2021  . INFLUENZA VACCINE  Completed  . DEXA SCAN  Completed  . PNA vac Low Risk Adult  Completed    Cancer Screenings: Lung: Low Dose CT Chest recommended if Age 70-80 years, 30 pack-year currently smoking OR have quit w/in 15years. Patient does not qualify. Breast:  Up to date on Mammogram? Yes   Up to date of Bone Density/Dexa? Yes Colorectal: Referral sent today.  Additional Screenings:  Hepatitis C Screening: N/A     Plan:  I have personally reviewed and addressed the Medicare Annual Wellness questionnaire and have noted the following in the patient's chart:  A. Medical and social history B. Use of alcohol, tobacco or illicit drugs  C. Current medications and supplements D. Functional ability and status E.  Nutritional status F.  Physical activity G. Advance directives H. List of other physicians I.  Hospitalizations, surgeries, and ER visits in previous 12 months J.  [place] such as hearing and vision if needed, cognitive and depression L. Referrals and appointments - none  In addition, I have reviewed  and discussed with patient certain preventive protocols, quality metrics, and best practice recommendations. A written personalized care plan for preventive services as well as general preventive health recommendations were provided to patient.  See attached scanned questionnaire for additional information.   Signed,  Fabio Neighbors, LPN Nurse Health Advisor   Nurse Recommendations: None.

## 2018-07-18 DIAGNOSIS — Z96653 Presence of artificial knee joint, bilateral: Secondary | ICD-10-CM | POA: Diagnosis not present

## 2018-07-18 DIAGNOSIS — M5416 Radiculopathy, lumbar region: Secondary | ICD-10-CM | POA: Diagnosis not present

## 2018-07-22 ENCOUNTER — Other Ambulatory Visit: Payer: Self-pay | Admitting: *Deleted

## 2018-07-22 NOTE — Patient Outreach (Signed)
[facility] [facility]) Care Management  07/22/2018  [name] [name] [birth year] [id number]   Telephone Screen  Referral Date: 07/17/18 Referral Source: [facility] referral  Referral Reason: Member request assistance with transportation to and from Dr's appointments  Insurance: HTA    Outreach attempt # 1 No answer. [facility] RN CM left HIPAA compliant voicemail message along with CM's contact info.   Plan: [facility] RN CM sent an unsuccessful outreach letter and scheduled this patient for another call attempt within 4 business days  Jami Bogdanski L. Lavina Hamman, RN, BSN, [place] Coordinator Office number [phone] Mobile number [phone]  Main [facility] number [phone] Fax number [phone]

## 2018-07-24 ENCOUNTER — Ambulatory Visit (INDEPENDENT_AMBULATORY_CARE_PROVIDER_SITE_OTHER): Payer: PPO | Admitting: Family Medicine

## 2018-07-24 ENCOUNTER — Encounter: Payer: Self-pay | Admitting: Family Medicine

## 2018-07-24 ENCOUNTER — Other Ambulatory Visit: Payer: Self-pay | Admitting: *Deleted

## 2018-07-24 VITALS — BP 110/73 | HR 109 | Temp 98.0°F | Resp 16 | Wt 156.0 lb

## 2018-07-24 DIAGNOSIS — M5432 Sciatica, left side: Secondary | ICD-10-CM

## 2018-07-24 MED ORDER — PREDNISONE 10 MG PO TABS: ORAL_TABLET | ORAL | 0 refills | Status: AC

## 2018-07-24 NOTE — Patient Outreach (Signed)
[facility]) Care Management  07/24/2018  [name] [name] [birth year] [id number]   Telephone Screen  Referral Date: 07/17/18 Referral Source: [facility] referral  Referral Reason: Member request assistance with transportation to and from Dr's appointments  Insurance: HTA    Patient returned a call to [facility] RN CM Patient is able to verify HIPAA Reviewed and addressed referral to [facility] with patient  Today's concern; sciatica nerve pain She is getting some relieve by sitting on a heat pack today but will be seeing MD this afternoon  Social: Divorced female living in an independent retirement area at [facility] in [place]. She has family (daughter, son in law, grandchildren, son lives out of state, sister) and friends but they all work and are available occasionally to assist with iADLs and transportation. 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  There is transportation at [facility] but it is limited to only once a month. She is independent/assist with ADLs and likes to walks her dog. On SSI    Conditions: atrial fibrillation, HTN, lumbar radiculopathy, knee osteoarthritis, osteoporosis, lumbar degenerative disc disease, hx of CVA, seizures, hx of colon polyps, chronic pain syndrome  Medications: pharmacy delivery of medicines.  denies concerns with taking medications as prescribed, affording medications, side effects of medications and questions about medications  Appointments: appt today 07/24/18 with primary MD Dr [name] [name] at 53  Dr [name] [name] orthoped  Advance Directives: She does not have advance directives and is interested in getting them completed. She states she will need assist with understanding the forms  Consent: [facility] RN CM reviewed [facility] services with patient. Patient gave verbal consent for services for [facility] SW. She denies need of services from [facility], pharmacy, health coach, or NP at this time  Falls x 2 in last 3 months but no injury  per pt, uses a w/c     Plan: [facility] RN CM will refer [name] to [facility] SW for assistance with alternative transportation resources and assist with advance directives  [facility] RN CM answered questions about how to obtain an rx from MD to get a 3 prong walker filed to HTA Pt encouraged to return a call to [facility] RN CM prn   [name] [name]. Lavina Hamman, RN, BSN, [place] Coordinator Office number [phone] Mobile number [phone]  Main [facility] number [phone] Fax number [phone]

## 2018-07-24 NOTE — Patient Instructions (Addendum)
   Apply ice pack for 10 minutes at least three times a day   Follow up with Dr. Sharlet Salina tomorrow. Fill and start prednisone if not feeling better after seeing Dr. Sharlet Salina tomorrow   Call for referral to physical therapy if not much better next week.

## 2018-07-24 NOTE — Progress Notes (Signed)
Patient: [name] [name] Female    DOB: 03-28-1941   77 y.o.   MRN: [id number] Visit Date: 07/24/2018  Today's Provider: Lelon Huh, MD   Chief Complaint  Patient presents with  . Back Pain   Subjective:    Back Pain  This is a new problem. The current episode started 1 to 4 weeks ago (9 days). The problem has been gradually worsening since onset. The pain is present in the gluteal. The quality of the pain is described as stabbing. The pain radiates to the left thigh and left knee. The pain is at a severity of 2/10. The pain is mild. The pain is worse during the day. The symptoms are aggravated by position, sitting, standing and twisting. Stiffness is present all day. Associated symptoms include leg pain, pelvic pain and weakness. Pertinent negatives include no abdominal pain, bladder incontinence, bowel incontinence, chest pain, dysuria, fever, headaches, numbness, paresis, paresthesias, perianal numbness or tingling. Treatments tried: Hydrocodone, Tylenol, ice and heat. The treatment provided mild relief.    Patient has had pain in her left buttock for 9 days. Pain radiates to the back of her left knee. Patient states pain is intermittent. Patient was seen in ER for pain on 07/13/2018 and given rx for hydrocodone. Patient states pain is severe at times. Patient has been taking hydrocodone, tylenol, using ice and heat with mild relief.   She does have appointment with Dr. Sharlet Salina tomorrow for epidural injection and is wondering if she should keep it.   Allergies  Allergen Reactions  . Aspirin Other (See Comments)    On xarelto     Current Outpatient Medications:  .  acetaminophen (TYLENOL) 650 MG CR tablet, Take 650 mg by mouth every 8 (eight) hours as needed for pain., Disp: , Rfl:  .  alendronate (FOSAMAX) 70 MG tablet, Take 1 tablet once a week by mouth., Disp: , Rfl:  .  amoxicillin (AMOXIL) 500 MG capsule, Take 4 tablets one hour before procedure, Disp: 8 capsule,  Rfl: 0 .  Artificial Tear Solution (SOOTHE XP XTRA PROTECTION OP), Place 1 drop into both eyes 2 (two) times daily as needed., Disp: , Rfl:  .  B Complex-Biotin-FA (EQL B COMPLEX 100) TABS, Take 0.4 mg tablet once daily by mouth, Disp: , Rfl:  .  Calcium-Magnesium-Vitamin D (CALCIUM 1200+D3 PO), Take 1,200 mg by mouth daily., Disp: , Rfl:  .  cholecalciferol (VITAMIN D) 1000 units tablet, Take 1,000 Units by mouth daily., Disp: , Rfl:  .  Coenzyme Q10 (CO Q-10) 100 MG CAPS, Take 100 mg by mouth daily. , Disp: , Rfl:  .  diltiazem (CARDIZEM CD) 240 MG 24 hr capsule, TAKE 1 CAPSULE BY MOUTH EVERY DAY., Disp: 90 capsule, Rfl: 4 .  gabapentin (NEURONTIN) 300 MG capsule, Take one at bedtime for 4 days, then increase to one twice daily, Disp: 60 capsule, Rfl: 1 .  Lactobacillus (ACIDOPHILUS PO), Take 1 tablet by mouth at bedtime. , Disp: , Rfl:  .  lidocaine (ASPERCREME W/LIDOCAINE) 4 % cream, Apply 1 application topically daily as needed. Apply to affected area for pain, Disp: , Rfl:  .  MEGARED OMEGA-3 KRILL OIL PO, Take 300 mg by mouth daily. , Disp: , Rfl:  .  metoprolol tartrate (LOPRESSOR) 25 MG tablet, TAKE 1 TABLET BY MOUTH TWICE A DAY. (Patient taking differently: Take 25 mg by mouth daily. ), Disp: 180 tablet, Rfl: 4 .  oxybutynin (DITROPAN) 5 MG tablet, Take  1 tablet (5 mg total) by mouth 2 (two) times daily., Disp: 180 tablet, Rfl: 3 .  traMADol (ULTRAM) 50 MG tablet, Take 1-2 tablets (50-100 mg total) by mouth every 4 (four) hours as needed for moderate pain., Disp: 60 tablet, Rfl: 1 .  TURMERIC PO, Take 800 mg by mouth 2 (two) times daily., Disp: , Rfl:  .  XARELTO 20 MG TABS tablet, TAKE 1 TABLET BY MOUTH DAILY WITH SUPPER, Disp: 90 tablet, Rfl: 4 .  Calcium Citrate-Vitamin D 200-250 MG-UNIT TABS, Take 2 tabs by mouth each morning and 1 tab by mouth in evening., Disp: , Rfl:  .  HYDROcodone-acetaminophen (NORCO) 7.5-325 MG tablet, Take 1 tablet by mouth every 8 (eight) hours as needed for  moderate pain. (Patient not taking: Reported on 07/24/2018), Disp: 15 tablet, Rfl: 0 .  nortriptyline (PAMELOR) 10 MG capsule, Take 2 capsules (20 mg total) by mouth at bedtime. (Patient not taking: Reported on 07/24/2018), Disp: 60 capsule, Rfl: 3  Review of Systems  Constitutional: Negative for appetite change, chills, fatigue and fever.  Respiratory: Negative for chest tightness and shortness of breath.   Cardiovascular: Negative for chest pain and palpitations.  Gastrointestinal: Negative for abdominal pain, bowel incontinence, nausea and vomiting.  Genitourinary: Positive for pelvic pain. Negative for bladder incontinence and dysuria.  Musculoskeletal: Positive for back pain.  Neurological: Positive for weakness. Negative for dizziness, tingling, numbness, headaches and paresthesias.    Social History   Tobacco Use  . Smoking status: Former Smoker    Last attempt to quit: 04/11/1972    Years since quitting: 46.3  . Smokeless tobacco: Never Used  Substance Use Topics  . Alcohol use: No    Alcohol/week: 0.0 standard drinks   Objective:   BP 110/73 (BP Location: Right Arm, Patient Position: Sitting, Cuff Size: Large)   Pulse (!) 109   Temp 98 F (36.7 C) (Oral)   Resp 16   Wt 156 lb (70.8 kg)   SpO2 95%   BMI 29.48 kg/m  Vitals:   07/24/18 1503  BP: 110/73  Pulse: (!) 109  Resp: 16  Temp: 98 F (36.7 C)  TempSrc: Oral  SpO2: 95%  Weight: 156 lb (70.8 kg)     Physical Exam   General Appearance:    Alert, cooperative, no distress  Eyes:    PERRL, conjunctiva/corneas clear, EOM's intact       Lungs:     Clear to auscultation bilaterally, respirations unlabored  Heart:    Regular rate and rhythm  MS:   Tender with pain radiating into buttocks with palpation over left lower lumbar spine and para lumbar muscles. No gross deformities.           Assessment & Plan:     1. Sciatica of left side She already has previously scheduled appointment with Dr. Sharlet Salina  tomorrow. Advised to keep this appointment as injection may be helpful. If not then may to fill prednisone prescription. Advised PT would likely be beneficial and to call for referral if not better in 1-2 weeks.  - predniSONE (DELTASONE) 10 MG tablet; 6 tablets for 2 days, then 5 for 2 days, then 4 for 2 days, then 3 for 2 days, then 2 for 2 days, then 1 for 2 days.  Dispense: 42 tablet; Refill: 0       Lelon Huh, MD  [facility]

## 2018-07-25 DIAGNOSIS — M5416 Radiculopathy, lumbar region: Secondary | ICD-10-CM | POA: Diagnosis not present

## 2018-07-25 DIAGNOSIS — M48062 Spinal stenosis, lumbar region with neurogenic claudication: Secondary | ICD-10-CM | POA: Diagnosis not present

## 2018-07-25 DIAGNOSIS — M5136 Other intervertebral disc degeneration, lumbar region: Secondary | ICD-10-CM | POA: Diagnosis not present

## 2018-07-31 ENCOUNTER — Other Ambulatory Visit: Payer: Self-pay

## 2018-07-31 NOTE — Patient Outreach (Signed)
[facility]) Care Management  07/31/2018  [name] [name] 1941-04-06 [id number]  BSW contacted the patient on today's date, HIPAA identifiers confirmed. BSW introduced self to the patients and the reason for today's call, indicating the patient had been referred to this BSW for assistance with transportation and advance directives. The patient was active with [place] [facility] CSW, [place] in 2018 for assistance with both transportation and advance directives.  The patient reports she continues to utilize [employer] but states she "heard [facility] provides transportation". BSW explained to the patient that unfortunately transportation provided by [place] [facility] is not an option as an ongoing Data processing manager. BSW encouraged the patient to access the free transportation offered by the independent living community she resides in. The patient reported concerns with availability. The patient acknowledges that she is linked to all available resources within her county.  The patient is not interested in completing an advance directive during today's call but thanked BSW for "following up".  BSW to perform a case closure as the patient has access to all desired resources.  Daneen Schick, BSW, CDP [facility] [phone]

## 2018-08-08 ENCOUNTER — Other Ambulatory Visit: Payer: Self-pay | Admitting: Family Medicine

## 2018-08-09 ENCOUNTER — Telehealth: Payer: Self-pay

## 2018-08-09 NOTE — Telephone Encounter (Signed)
Discussed colonoscopy with patient.  She has decided that she does not want a colonoscopy due to her age.  The risks of complications are increased.  Referral will be canceled.  Thanks [employer]

## 2018-08-13 ENCOUNTER — Other Ambulatory Visit: Payer: Self-pay | Admitting: Neurology

## 2018-08-13 DIAGNOSIS — R569 Unspecified convulsions: Secondary | ICD-10-CM | POA: Diagnosis not present

## 2018-08-13 DIAGNOSIS — I69319 Unspecified symptoms and signs involving cognitive functions following cerebral infarction: Secondary | ICD-10-CM | POA: Diagnosis not present

## 2018-08-16 ENCOUNTER — Ambulatory Visit
Admission: RE | Admit: 2018-08-16 | Discharge: 2018-08-16 | Disposition: A | Discharge status: Home / Self Care | Payer: PPO | Source: Ambulatory Visit | Attending: Neurology | Admitting: Neurology

## 2018-08-21 ENCOUNTER — Ambulatory Visit (INDEPENDENT_AMBULATORY_CARE_PROVIDER_SITE_OTHER): Payer: PPO | Admitting: Family Medicine

## 2018-08-21 ENCOUNTER — Encounter: Payer: Self-pay | Admitting: Family Medicine

## 2018-08-21 VITALS — BP 124/76 | HR 76 | Temp 98.4°F | Resp 16 | Wt 158.0 lb

## 2018-08-21 DIAGNOSIS — M461 Sacroiliitis, not elsewhere classified: Secondary | ICD-10-CM

## 2018-08-21 MED ORDER — PREDNISONE 20 MG PO TABS: 20.0000 mg | ORAL_TABLET | Freq: Every day | ORAL | 0 refills | Status: AC

## 2018-08-21 MED ORDER — TRAMADOL HCL 50 MG PO TABS: 50.0000 mg | ORAL_TABLET | ORAL | 1 refills | Status: DC | PRN

## 2018-08-21 NOTE — Progress Notes (Signed)
Patient: [name] [name] Female    DOB: [birth year]   77 y.o.   MRN: [id number] Visit Date: 08/21/2018  Today's Provider: Lelon Huh, MD   Chief Complaint  Patient presents with  . Hip Pain   Subjective:    Hip Pain   The incident occurred at home. The injury mechanism was a fall. The pain is present in the left hip and right hip. The quality of the pain is described as burning, aching and stabbing. The pain has been constant since onset. Associated symptoms include muscle weakness. Pertinent negatives include no inability to bear weight, loss of motion, loss of sensation, numbness or tingling. She reports no foreign bodies present. She has tried heat, ice, acetaminophen and rest for the symptoms.  However, she states the fall was about 2 weeks ago and she didn't have any pain until the last 3-4 days.      Allergies  Allergen Reactions  . Aspirin Other (See Comments)    On xarelto     Current Outpatient Medications:  .  acetaminophen (TYLENOL) 650 MG CR tablet, Take 650 mg by mouth every 8 (eight) hours as needed for pain., Disp: , Rfl:  .  alendronate (FOSAMAX) 70 MG tablet, Take 1 tablet once a week by mouth., Disp: , Rfl:  .  amoxicillin (AMOXIL) 500 MG capsule, Take 4 tablets one hour before procedure, Disp: 8 capsule, Rfl: 0 .  Artificial Tear Solution (SOOTHE XP XTRA PROTECTION OP), Place 1 drop into both eyes 2 (two) times daily as needed., Disp: , Rfl:  .  B Complex-Biotin-FA (EQL B COMPLEX 100) TABS, Take 0.4 mg tablet once daily by mouth, Disp: , Rfl:  .  Calcium Citrate-Vitamin D 200-250 MG-UNIT TABS, Take 2 tabs by mouth each morning and 1 tab by mouth in evening., Disp: , Rfl:  .  cholecalciferol (VITAMIN D) 1000 units tablet, Take 1,000 Units by mouth daily., Disp: , Rfl:  .  Coenzyme Q10 (CO Q-10) 100 MG CAPS, Take 100 mg by mouth daily. , Disp: , Rfl:  .  diltiazem (CARDIZEM CD) 240 MG 24 hr capsule, TAKE 1 CAPSULE BY MOUTH EVERY DAY., Disp: 90  capsule, Rfl: 4 .  gabapentin (NEURONTIN) 300 MG capsule, Take one at bedtime for 4 days, then increase to one twice daily, Disp: 60 capsule, Rfl: 1 .  Lactobacillus (ACIDOPHILUS PO), Take 1 tablet by mouth at bedtime. , Disp: , Rfl:  .  lidocaine (ASPERCREME W/LIDOCAINE) 4 % cream, Apply 1 application topically daily as needed. Apply to affected area for pain, Disp: , Rfl:  .  MEGARED OMEGA-3 KRILL OIL PO, Take 300 mg by mouth daily. , Disp: , Rfl:  .  metoprolol tartrate (LOPRESSOR) 25 MG tablet, TAKE 1 TABLET BY MOUTH TWICE A DAY. (Patient taking differently: Take 25 mg by mouth daily. ), Disp: 180 tablet, Rfl: 4 .  oxybutynin (DITROPAN) 5 MG tablet, TAKE 1 TABLET BY MOUTH 2 TIMES DAILY, Disp: 180 tablet, Rfl: 3 .  TURMERIC PO, Take 800 mg by mouth 2 (two) times daily., Disp: , Rfl:  .  XARELTO 20 MG TABS tablet, TAKE 1 TABLET BY MOUTH DAILY WITH SUPPER, Disp: 90 tablet, Rfl: 4 .  Calcium-Magnesium-Vitamin D (CALCIUM 1200+D3 PO), Take 1,200 mg by mouth daily., Disp: , Rfl:  .  traMADol (ULTRAM) 50 MG tablet, Take 1-2 tablets (50-100 mg total) by mouth every 4 (four) hours as needed for moderate pain. (Patient not taking: Reported on  08/21/2018), Disp: 60 tablet, Rfl: 1  Review of Systems  Constitutional: Negative.   Musculoskeletal: Positive for arthralgias, back pain, gait problem and myalgias. Negative for joint swelling, neck pain and neck stiffness.  Neurological: Negative for dizziness, tingling, light-headedness, numbness and headaches.    Social History   Tobacco Use  . Smoking status: Former Smoker    Last attempt to quit: 04/11/1972    Years since quitting: 46.3  . Smokeless tobacco: Never Used  Substance Use Topics  . Alcohol use: No    Alcohol/week: 0.0 standard drinks   Objective:   BP 124/76 (BP Location: Right Arm, Patient Position: Sitting, Cuff Size: Normal)   Pulse 76   Temp 98.4 F (36.9 C) (Oral)   Resp 16   Wt 158 lb (71.7 kg)   BMI 29.85 kg/m  Vitals:    08/21/18 1450  BP: 124/76  Pulse: 76  Resp: 16  Temp: 98.4 F (36.9 C)  TempSrc: Oral  Weight: 158 lb (71.7 kg)     Physical Exam  General appearance: alert, well developed, well nourished, cooperative and in no distress Head: Normocephalic, without obvious abnormality, atraumatic Respiratory: Respirations even and unlabored, normal respiratory rate Extremities: Tender right SI joint.      Assessment & Plan:     1. Sacroiliitis ([place]) Not a good candidate for NSAIDs due to being on xarelto. Will try oral steroids. Discussed possible steroid injection but she states she has upcoming appointment with Dr. Sharlet Salina  - predniSONE (DELTASONE) 20 MG tablet; Take 1 tablet (20 mg total) by mouth daily for 10 days.  Dispense: 10 tablet; Refill: 0 - traMADol (ULTRAM) 50 MG tablet; Take 1-2 tablets (50-100 mg total) by mouth every 4 (four) hours as needed for moderate pain.  Dispense: 60 tablet; Refill: 1 2     Lelon Huh, MD  [facility]

## 2018-08-22 ENCOUNTER — Other Ambulatory Visit: Payer: Self-pay | Admitting: Family Medicine

## 2018-08-22 DIAGNOSIS — M461 Sacroiliitis, not elsewhere classified: Secondary | ICD-10-CM

## 2018-08-22 MED ORDER — TRAMADOL HCL 50 MG PO TABS: 50.0000 mg | ORAL_TABLET | Freq: Three times a day (TID) | ORAL | 1 refills | Status: DC | PRN

## 2018-08-22 NOTE — Progress Notes (Signed)
Denied due to insurance covering no more than 240/30 days. Changed sig to 1-2 tablets Q8 prn

## 2018-08-27 ENCOUNTER — Other Ambulatory Visit: Payer: Self-pay | Admitting: *Deleted

## 2018-08-27 NOTE — Addendum Note (Signed)
Addended by: Barbaraann Faster on: 08/27/2018 04:47 PM   Modules accepted: Orders

## 2018-08-27 NOTE — Patient Outreach (Signed)
[place] [facility]) Care Management  08/27/2018  [name] [name] 1940-12-27 [id number]   Telephone Screen  Referral Date:08/26/18 Referral Source: HTA referral  Referral Reason: Member is in the coverage gap and  In need of financial assistance.  Member needs assistance getting xarelto and if possible rides to and from  Doctor appointments Insurance: HTA    Outreach attempt # 1 No answer. [facility] RN CM left HIPAA compliant voicemail message along with CM's contact info.   Plan: [facility] RN CM sent an unsuccessful outreach letter and scheduled this patient for another call attempt within 4 business days   [name] Bluett L. Lavina Hamman, RN, BSN, [place] Coordinator Office number [phone] Mobile number [phone]  Main [facility] number [phone] Fax number [phone]

## 2018-08-27 NOTE — Patient Outreach (Signed)
[name]) Care Management  08/27/2018  [name] [name] April 16, 1941 [id number]    Telephone Screen  Referral Date:08/26/18 Referral Source: HTA referral  Referral Reason: Member is in the coverage gap and  In need of financial assistance.  Member needs assistance getting xarelto and if possible rides to and from doctor appointments Insurance: HTA   Patient returned a call to [facility] RN CM Patient is able to verify HIPAA Reviewed and addressed referral to [facility] with patient This patient was assessed in October 2019 for similar needs with transportation but today she discusses financial concerns after being in the donut hole  She reports just returning from [place], having leg and bilateral hip pain from being out in the community She reports she has medicine to take to assist   Social: Divorced female living in an independent retirement area at [facility] in [place]. She has family (daughter, son in law, grandchildren, son lives out of state, sister) and friends but they all work and are available occasionally to assist with iADLs and transportation. 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  There is transportation at [facility] but it is limited to only once a month. She is independent/assist with ADLs and likes to walks her dog. On SSI   Conditions: atrial fibrillation, HTN, lumbar radiculopathy, knee osteoarthritis, osteoporosis, lumbar degenerative disc disease, hx of CVA, seizures, hx of colon polyps, chronic pain syndrome. TKA  falls 3  DME Walkers x 4 cane   Appointments: To see Dr [name] [name] on 09/12/18   Advance Directives: She does not have advance directives. She has staff who is a Patent examiner at the retirement facility but difficult to understand. She voices again interest in advance directives. In October 2019 when assist offered she refused  Consent: [facility] RN CM reviewed [facility] services with patient. Patient gave verbal consent for service SW  and pharmacy .   medications-  pharmacy delivery of medicines  But today she voices concern with being in the donut hole related to xarelto She confirms she has been xarelto for a few years  She reports that Dr [name] [name] provides the Rx for Xarelto She reports a cost of  $45 and an increase to $116 for 1/2 month supply She denies previous assist from [place] for medication assistance but believes she has to much income to qualify for assistance    Plan will refer her to [facility] SW for assistance with transportation, advance directives and financial resources  and to [facility] pharmacist for assistance with coverage gap related to xarelto, polypharmacy and medication management  [name] [name] L. Lavina Hamman, RN, BSN, [place] Coordinator Office number [phone] Mobile number [phone]  Main [facility] number [phone] Fax number [phone]

## 2018-08-29 ENCOUNTER — Other Ambulatory Visit: Payer: Self-pay | Admitting: *Deleted

## 2018-08-29 ENCOUNTER — Telehealth: Payer: Self-pay

## 2018-08-29 ENCOUNTER — Ambulatory Visit: Payer: PPO | Admitting: *Deleted

## 2018-08-29 NOTE — Telephone Encounter (Signed)
Patient reports left hip pain has not improved on Prednisone. Patient wants to know if you can recommend anything else to help with pain. Patient does have appt with Dr. Margette Fast tomorrow. Please advise.

## 2018-08-29 NOTE — Telephone Encounter (Signed)
Patient advised.

## 2018-08-29 NOTE — Patient Outreach (Signed)
[place] [facility]) Care Management  08/29/2018  [name] [name] 09/07/1941 [id number]   Outreach attempt #1  Patient referred to this social worker to assist patient with resources for financial assistance,  transportation, and completing her Scientist, physiological.  Patient did not answer. This Education officer, museum will send patient an Economist and will make 2nd attempt to reach patient within 3-4 business days.   Sheralyn Boatman [facility] Care Management [phone]

## 2018-08-29 NOTE — Telephone Encounter (Signed)
Tried calling patient. Left message to call back. 

## 2018-08-29 NOTE — Telephone Encounter (Signed)
No. She can see if there is anything Dr. Sharlet Salina can do. If not she should see orthopedist.

## 2018-08-30 ENCOUNTER — Other Ambulatory Visit: Payer: Self-pay | Admitting: Pharmacist

## 2018-08-30 DIAGNOSIS — M48062 Spinal stenosis, lumbar region with neurogenic claudication: Secondary | ICD-10-CM | POA: Diagnosis not present

## 2018-08-30 DIAGNOSIS — M5416 Radiculopathy, lumbar region: Secondary | ICD-10-CM | POA: Diagnosis not present

## 2018-08-30 DIAGNOSIS — M5136 Other intervertebral disc degeneration, lumbar region: Secondary | ICD-10-CM | POA: Diagnosis not present

## 2018-08-30 NOTE — Patient Outreach (Signed)
[facility]) Care Management [place]   08/30/2018  [name] [name] 30-May-1941 [id number]   Reason for referral: Medication Assistance - Xarelto  Referral source: HTA  Current insurance: HTA   Left HIPAA compliant message for patient to return my call. Will f/u in 2-3 business days if I have not heard back.   Catie Darnelle Maffucci, PharmD PGY2 Ambulatory Care Pharmacy Resident, [facility] Phone: [phone]

## 2018-09-03 ENCOUNTER — Other Ambulatory Visit: Payer: Self-pay | Admitting: *Deleted

## 2018-09-03 ENCOUNTER — Ambulatory Visit
Admission: RE | Admit: 2018-09-03 | Discharge: 2018-09-03 | Disposition: A | Discharge status: Home / Self Care | Payer: PPO | Source: Ambulatory Visit | Attending: Family Medicine | Admitting: Family Medicine

## 2018-09-03 ENCOUNTER — Ambulatory Visit (INDEPENDENT_AMBULATORY_CARE_PROVIDER_SITE_OTHER): Payer: PPO | Admitting: Family Medicine

## 2018-09-03 ENCOUNTER — Other Ambulatory Visit: Payer: Self-pay | Admitting: Pharmacist

## 2018-09-03 ENCOUNTER — Encounter: Payer: Self-pay | Admitting: *Deleted

## 2018-09-03 ENCOUNTER — Encounter: Payer: Self-pay | Admitting: Family Medicine

## 2018-09-03 VITALS — BP 116/74 | HR 64 | Temp 97.8°F | Resp 16 | Wt 158.0 lb

## 2018-09-03 DIAGNOSIS — M545 Low back pain, unspecified: Secondary | ICD-10-CM

## 2018-09-03 DIAGNOSIS — M419 Scoliosis, unspecified: Secondary | ICD-10-CM | POA: Insufficient documentation

## 2018-09-03 DIAGNOSIS — M25552 Pain in left hip: Secondary | ICD-10-CM | POA: Insufficient documentation

## 2018-09-03 DIAGNOSIS — I482 Chronic atrial fibrillation, unspecified: Secondary | ICD-10-CM | POA: Diagnosis not present

## 2018-09-03 MED ORDER — HYDROCODONE-ACETAMINOPHEN 7.5-325 MG PO TABS: 1.0000 | ORAL_TABLET | Freq: Four times a day (QID) | ORAL | 0 refills | Status: AC | PRN

## 2018-09-03 NOTE — Progress Notes (Signed)
Patient: [name] [name] Female    DOB: July 16, 1941   77 y.o.   MRN: [id number] Visit Date: 09/03/2018  Today's Provider: Lelon Huh, MD   Chief Complaint  Patient presents with  . Back Pain   Subjective:    Back Pain  This is a chronic problem. The problem occurs constantly. The problem has been gradually worsening since onset. The pain is present in the lumbar spine. The pain radiates to the left thigh. Exacerbated by: Pt reports "Any Movement" will aggravate the pain. Associated symptoms include leg pain. Pertinent negatives include no abdominal pain, bladder incontinence, bowel incontinence, chest pain, headaches, numbness, paresthesias, perianal numbness, tingling or weakness. Treatments tried: Tramadol, Prednisone, heat. The treatment provided mild relief.  She also reports she had steroid njection by Dr. Sharlet Salina which did not help at all. She states pain radiates to lateral thigh.       Allergies  Allergen Reactions  . Aspirin Other (See Comments)    On xarelto     Current Outpatient Medications:  .  acetaminophen (TYLENOL) 650 MG CR tablet, Take 650 mg by mouth every 8 (eight) hours as needed for pain., Disp: , Rfl:  .  alendronate (FOSAMAX) 70 MG tablet, Take 1 tablet once a week by mouth., Disp: , Rfl:  .  Artificial Tear Solution (SOOTHE XP XTRA PROTECTION OP), Place 1 drop into both eyes 2 (two) times daily as needed., Disp: , Rfl:  .  B Complex-Biotin-FA (EQL B COMPLEX 100) TABS, Take 0.4 mg tablet once daily by mouth, Disp: , Rfl:  .  Calcium-Magnesium-Vitamin D (CALCIUM 1200+D3 PO), Take 1,200 mg by mouth daily., Disp: , Rfl:  .  cholecalciferol (VITAMIN D) 1000 units tablet, Take 1,000 Units by mouth daily., Disp: , Rfl:  .  Coenzyme Q10 (CO Q-10) 100 MG CAPS, Take 100 mg by mouth daily. , Disp: , Rfl:  .  diltiazem (CARDIZEM CD) 240 MG 24 hr capsule, TAKE 1 CAPSULE BY MOUTH EVERY DAY., Disp: 90 capsule, Rfl: 4 .  gabapentin (NEURONTIN) 300 MG capsule,  Take one at bedtime for 4 days, then increase to one twice daily, Disp: 60 capsule, Rfl: 1 .  Lactobacillus (ACIDOPHILUS PO), Take 1 tablet by mouth at bedtime. , Disp: , Rfl:  .  lidocaine (ASPERCREME W/LIDOCAINE) 4 % cream, Apply 1 application topically daily as needed. Apply to affected area for pain, Disp: , Rfl:  .  MEGARED OMEGA-3 KRILL OIL PO, Take 300 mg by mouth daily. , Disp: , Rfl:  .  metoprolol tartrate (LOPRESSOR) 25 MG tablet, TAKE 1 TABLET BY MOUTH TWICE A DAY. (Patient taking differently: Take 25 mg by mouth daily. ), Disp: 180 tablet, Rfl: 4 .  oxybutynin (DITROPAN) 5 MG tablet, TAKE 1 TABLET BY MOUTH 2 TIMES DAILY, Disp: 180 tablet, Rfl: 3 .  traMADol (ULTRAM) 50 MG tablet, Take 1-2 tablets (50-100 mg total) by mouth every 8 (eight) hours as needed for moderate pain., Disp: 60 tablet, Rfl: 1 .  TURMERIC PO, Take 800 mg by mouth 2 (two) times daily., Disp: , Rfl:  .  XARELTO 20 MG TABS tablet, TAKE 1 TABLET BY MOUTH DAILY WITH SUPPER, Disp: 90 tablet, Rfl: 4 .  amoxicillin (AMOXIL) 500 MG capsule, Take 4 tablets one hour before procedure (Patient not taking: Reported on 09/03/2018), Disp: 8 capsule, Rfl: 0  Review of Systems  Constitutional: Negative.   Cardiovascular: Negative for chest pain.  Gastrointestinal: Negative for abdominal pain and  bowel incontinence.  Genitourinary: Negative for bladder incontinence.  Musculoskeletal: Positive for back pain, gait problem and joint swelling (Left knee). Negative for arthralgias, myalgias, neck pain and neck stiffness.  Neurological: Positive for light-headedness. Negative for dizziness, tingling, weakness, numbness, headaches and paresthesias.    Social History   Tobacco Use  . Smoking status: Former Smoker    Last attempt to quit: 04/11/1972    Years since quitting: 46.4  . Smokeless tobacco: Never Used  Substance Use Topics  . Alcohol use: No    Alcohol/week: 0.0 standard drinks   Objective:   BP 116/74 (BP Location:  Right Arm, Patient Position: Sitting, Cuff Size: Normal)   Pulse 64   Temp 97.8 F (36.6 C) (Oral)   Resp 16   Wt 158 lb (71.7 kg)   BMI 29.85 kg/m  Vitals:   09/03/18 1520  BP: 116/74  Pulse: 64  Resp: 16  Temp: 97.8 F (36.6 C)  TempSrc: Oral  Weight: 158 lb (71.7 kg)     Physical Exam  Tender over left lumbar spine. No leg tenderness.     Assessment & Plan:     1. Acute midline low back pain without sciatica No preceding trauma., no improvement with prednisone or steroid injection. Not candidate for nsaids due to xarelto as below. Given prescription for hydrocodone/apap  - DG HIP UNILAT W OR W/O PELVIS 2-3 VIEWS LEFT; Future  2. Pain of left hip joint  - DG Lumbar Spine Complete; Future  3. Atrial fibrillation, chronic Stable on xarelto. Avoid nsaids.        Lelon Huh, MD  [facility]

## 2018-09-03 NOTE — Patient Outreach (Addendum)
[place] [facility]) Care Management  Lupus   09/03/2018  [name] [name] 1941-02-02 [id number]  Reason for referral: Medication Assistance - Xarelto   Referral source: HTA Referral medication(s): Xarelto Current insurance: [employer]  PMHx: Atrial fibrillation, HTN, OA, osteoporosis, hx CVA, hx seizures  Contacted patient; HIPAA verifiers identified. She notes that her Xarelto copay has become very expensive, and wonders if anything can be done to help the cost. She reports that she was previously on warfarin therapy, but was switched to Xarelto and she much prefers this.   She notes that she takes many vitamins/supplements, including scheduled acetaminophen, ~3900 mg daily. She reports that her cardiologist, Dr. Rockey Situ, reduced her metoprolol dosing to once daily. She also notes that she has stopped taking alendronate recently. When asked why, she was concerned about it worsening her leg and back pain. She reports some relief from the lidocaine ointment, but does not feel that the tramadol provides significant benefit. She states that the gabapentin was prescribed for "sensations" in her feet that she describes as feeling like a sock is bunched up in her shoe, and that it has provided improvement.   Objective: Allergies  Allergen Reactions  . Aspirin Other (See Comments)    On xarelto    Medications Reviewed Today    Reviewed by De Hollingshead, [facility] (Pharmacist) on 09/03/18 at 1051  Med List Status: <None>  Medication Order Taking? Sig Documenting Provider Last Dose Status Informant  acetaminophen (TYLENOL) 650 MG CR tablet [id number] Yes Take 650 mg by mouth every 8 (eight) hours as needed for pain. [provider] Taking Active Self           Med Note Darnelle Maffucci, Anarosa Kubisiak E   Tue Sep 03, 2018 10:40 AM) Takes 1300 Q8H  alendronate (FOSAMAX) 70 MG tablet [id number] No Take 1 tablet once a week by mouth. [provider] Not Taking  Active   amoxicillin (AMOXIL) 500 MG capsule [id number] No Take 4 tablets one hour before procedure  Patient not taking:  Reported on 09/03/2018   Birdie Sons, MD Not Taking Active   Artificial Tear Solution (SOOTHE XP XTRA PROTECTION OP) [id number] Yes Place 1 drop into both eyes 2 (two) times daily as needed. [provider] Taking Active   B Complex-Biotin-FA (EQL B COMPLEX 100) TABS [id number] Yes Take 0.4 mg tablet once daily by mouth [provider] Taking Active   Calcium-Magnesium-Vitamin D (CALCIUM 1200+D3 PO) [id number] Yes Take 1,200 mg by mouth daily. [provider] Taking Active   cholecalciferol (VITAMIN D) 1000 units tablet [id number] Yes Take 1,000 Units by mouth daily. [provider] Taking Active Self  Coenzyme Q10 (CO Q-10) 100 MG CAPS [id number] Yes Take 100 mg by mouth daily.  [provider] Taking Active Self  diltiazem (CARDIZEM CD) 240 MG 24 hr capsule [id number] Yes TAKE 1 CAPSULE BY MOUTH EVERY DAY. Birdie Sons, MD Taking Active            Med Note ([place] Sep 03, 2018 10:45 AM) HS  gabapentin (NEURONTIN) 300 MG capsule [id number] Yes Take one at bedtime for 4 days, then increase to one twice daily Birdie Sons, MD Taking Active            Med Note Darnelle Maffucci, Arville Lime   Tue Sep 03, 2018 10:45 AM) Taking twice daily  Lactobacillus (ACIDOPHILUS PO) [id number] Yes Take 1 tablet by mouth at bedtime.  [provider] Taking Active Self  lidocaine (ASPERCREME W/LIDOCAINE) 4 % cream [id number] Yes Apply 1 application topically daily as needed. Apply to affected area for pain [provider] Taking Active   MEGARED OMEGA-3 KRILL OIL PO [id number] Yes Take 300 mg by mouth daily.  [provider] Taking Active   metoprolol tartrate (LOPRESSOR) 25 MG tablet [id number] Yes TAKE 1 TABLET BY MOUTH TWICE A DAY.  Patient taking differently:  Take 25 mg by mouth daily.    Birdie Sons, MD  Taking Active   oxybutynin (DITROPAN) 5 MG tablet [id number] Yes TAKE 1 TABLET BY MOUTH 2 TIMES DAILY Caryn Section Kirstie Peri, MD Taking Active   traMADol (ULTRAM) 50 MG tablet [id number] Yes Take 1-2 tablets (50-100 mg total) by mouth every 8 (eight) hours as needed for moderate pain. Birdie Sons, MD Taking Active            Med Note ([place]   Tue Sep 03, 2018 10:50 AM) 2 tabs every 8 hours  TURMERIC PO [id number] Yes Take 800 mg by mouth 2 (two) times daily. [provider] Taking Active   XARELTO 20 MG TABS tablet [id number] Yes TAKE 1 TABLET BY MOUTH DAILY WITH SUPPER Fisher, Kirstie Peri, MD Taking Active   Med List Note Landis Martins, [place] 11/16/17 1008): Medical clearance sent to Dr Caryn Section 11/05/17 LP 11-16-17 Patient instructed per voicemail to stop Eliquis 3 days before procedure. DW          Assessment:  Drugs sorted by system:  Cardiovascular: diltiazem, metoprolol  Endocrine: alendronate (not taking)  Pain: acetaminophen, gabapentin, lidocaine ointment, tramadol  Genitourinary: oxybutynin  Vitamins/Minerals/Supplements: B complex, calcium + vit D, coenzyme Q10, probiotic, omega 3 fatty acids, turmeric   Medication Review Findings:  . Acetaminophen: Consider checking liver function tests due to high (though within recommended limits) consumption of acetaminophen . Drug Drug Interaction: Tumeric may increase the risk of bleeds in combination with anticoagulants . Leg Pain: May consider working up leg pain for atypical femoral fracture as related to bisphosphonate therapy; however, this is typically seen with long term bisphosphonate therapy, and patient has only been taking alendronate for ~2 years.   Medication Assistance Findings:  Extra Help:   '[]'  Already receiving Full Extra Help  '[]'  Already receiving Partial Extra Help  '[]'  Eligible based on reported income and assets  '[x]'  Not Eligible based on reported income and assets  Patient Assistance  Programs: 1) Xarelto made by [employer] and [place] requirement met: '[]'  Yes '[]'  No '[x]'  Unknown o Out-of-pocket prescription expenditure met (4% of yearly income):    '[]'  Yes '[x]'  No  '[]'  Unknown  '[]'  Not applicable - Reached out to [employer] to determine patient's out of pocket spend this calendar year. Per reported yearly income, patient would need to spend $400-500 more out of pocket this calendar year to qualify for patient assistance, which is highly unlikely, given her concurrent medications  Plan: - Discussed potential for increased risk of bleeds with concurrent turmeric + anticoagulant therapy with patient; she verbalized understanding, and noted that she had been on both agents for years with no concerns.    - Patient does not meet criteria for assistance with Xarelto. I gave patient my contact information, and encouraged her to reach out next year if future medication related financial concerns arise. Recommended she reach out to PCP/Cardiology office to see if any Xarelto 20 mg samples are available; she declined doing this, as she  didn't want to take expensive medications away from other patients who couldn't afford them.  - Will route note to PCP for notification   Catie Darnelle Maffucci, PharmD PGY2 Ambulatory Care Pharmacy Resident, [place] Phone: [phone]

## 2018-09-03 NOTE — Telephone Encounter (Signed)
This encounter was created in error - please disregard.

## 2018-09-03 NOTE — Patient Outreach (Signed)
[place] [facility]) Care Management  09/03/2018  [name] [name] 12/31/1940 [id number]   Patient referred to this social worker to assist with transportation needs and her Advanced Directive. Patient currently lives in a [place]. Per patient she uses [employer] but cannot always pay the $6.00 round trip fee. She also has a daughter and friend that will assist with transportation if needed.  This Education officer, museum discussed a referral for para-transit through [employer]. A representative from this agency will be in contact with this patient. Patient also requested a copy of the Advanced Directive which will be mailed to her. This social worker will call patient within 2 weeks to assist with completing her advanced directive if needed.   Sheralyn Boatman [facility] Care Management [phone]

## 2018-09-04 ENCOUNTER — Telehealth: Payer: Self-pay | Admitting: Family Medicine

## 2018-09-04 NOTE — Telephone Encounter (Signed)
Insurance will not allow an MRI unless her symptoms last more than  6 weeks, or if she fails a trial of physical therapy. If she doesn't do PT, then she needs to call back if not better by mid December.

## 2018-09-04 NOTE — Telephone Encounter (Signed)
-----   Message from Birdie Sons, MD sent at 09/04/2018 11:53 AM EST ----- xrays are normal. No fracture she probably has a pinched nerve or herniated disk. Recommend referral to physical therapy. If not improving with PT will need to get MRI.

## 2018-09-04 NOTE — Telephone Encounter (Signed)
Pt is calling back to check on Xray results. Please call pt to discuss once ready.  Thanks, [employer]

## 2018-09-04 NOTE — Telephone Encounter (Signed)
Pt advised.  She states she has tried PT before and it did not help.  She says it is difficult for her to get to PT since she no longer drives.  Pt is wanting to go ahead with the MRI if possible.  Please advise.   Thanks,   -Mickel Baas

## 2018-09-05 DIAGNOSIS — R569 Unspecified convulsions: Secondary | ICD-10-CM | POA: Diagnosis not present

## 2018-09-05 NOTE — Telephone Encounter (Signed)
Tried calling patient. Left message to call back. 

## 2018-09-05 NOTE — Telephone Encounter (Signed)
Patient advised. She does not want to do PT, so she says she will call back mod December for a referral for MRI. Patient also states that the Hydrocodone that was prescribed is not helping with pain. She wants to know if there is something else she can take in addition to it, or something else she can try for pain relief? Please advise.

## 2018-09-06 MED ORDER — OXYCODONE-ACETAMINOPHEN 7.5-325 MG PO TABS: 1.0000 | ORAL_TABLET | ORAL | 0 refills | Status: DC | PRN

## 2018-09-06 NOTE — Telephone Encounter (Signed)
Pt calling back to check if Dr. Caryn Section can advise on the pain she is having.  Thanks, [employer]

## 2018-09-06 NOTE — Telephone Encounter (Signed)
Can change to oxycodone/apap. Have sent in new prescription. If that doesn't work she will need to go to pain clinic. There is nothing I can prescribe.

## 2018-09-06 NOTE — Telephone Encounter (Signed)
Patient advised. Patient states she has taken oxycodone/ apap in the past and that didn't work for her. She is willing to try it again.

## 2018-09-12 ENCOUNTER — Ambulatory Visit (INDEPENDENT_AMBULATORY_CARE_PROVIDER_SITE_OTHER): Payer: PPO | Admitting: Family Medicine

## 2018-09-12 ENCOUNTER — Encounter: Payer: Self-pay | Admitting: Family Medicine

## 2018-09-12 VITALS — BP 120/80 | HR 62 | Temp 98.1°F | Resp 16 | Ht 61.0 in | Wt 153.0 lb

## 2018-09-12 DIAGNOSIS — Z86010 Personal history of colonic polyps: Secondary | ICD-10-CM

## 2018-09-12 DIAGNOSIS — M810 Age-related osteoporosis without current pathological fracture: Secondary | ICD-10-CM | POA: Diagnosis not present

## 2018-09-12 DIAGNOSIS — I482 Chronic atrial fibrillation, unspecified: Secondary | ICD-10-CM | POA: Diagnosis not present

## 2018-09-12 DIAGNOSIS — I10 Essential (primary) hypertension: Secondary | ICD-10-CM

## 2018-09-12 DIAGNOSIS — M545 Low back pain: Secondary | ICD-10-CM | POA: Diagnosis not present

## 2018-09-12 DIAGNOSIS — N3289 Other specified disorders of bladder: Secondary | ICD-10-CM | POA: Insufficient documentation

## 2018-09-12 DIAGNOSIS — Z0000 Encounter for general adult medical examination without abnormal findings: Secondary | ICD-10-CM

## 2018-09-12 DIAGNOSIS — G8929 Other chronic pain: Secondary | ICD-10-CM

## 2018-09-12 DIAGNOSIS — R569 Unspecified convulsions: Secondary | ICD-10-CM | POA: Diagnosis not present

## 2018-09-12 MED ORDER — ALENDRONATE SODIUM 70 MG PO TABS: 70.0000 mg | ORAL_TABLET | ORAL | 4 refills | Status: DC

## 2018-09-12 NOTE — Progress Notes (Addendum)
Patient: [name] [name], Female    DOB: [birth year], 77 y.o.   MRN: [id number] Visit Date: 09/12/2018  Today's Provider: Lelon Huh, MD   Chief Complaint  Patient presents with  . Annual Exam   Subjective:     Complete Physical [name] [name] is a 77 y.o. female. She feels fairly well. She reports exercising daily. She reports she is sleeping fairly well.  ----------------------------------------------------------- Atrial Fibrillation: Patient was last seen for this problem 1 week ago and no changes were made. Patient reports good compliance with Xarelto.   Hypertension, follow-up:  BP Readings from Last 3 Encounters:  09/12/18 120/80  09/03/18 116/74  08/21/18 124/76    She was last seen for hypertension 1 years ago.  Management since that visit includes no changes. She reports good compliance with treatment. She is not having side effects.  She is exercising. She is not adherent to low salt diet.   Outside blood pressures are not being checked. She is experiencing none.  Patient denies chest pain, chest pressure/discomfort, claudication, dyspnea, exertional chest pressure/discomfort, fatigue, irregular heart beat, lower extremity edema, near-syncope, orthopnea, palpitations, paroxysmal nocturnal dyspnea, syncope and tachypnea.   Cardiovascular risk factors include advanced age (older than 22 for men, 24 for women) and hypertension.  Use of agents associated with hypertension: none.     Weight trend: decreasing steadily Wt Readings from Last 3 Encounters:  09/12/18 153 lb (69.4 kg)  09/03/18 158 lb (71.7 kg)  08/21/18 158 lb (71.7 kg)    Current diet: in general, an "unhealthy" diet  ------------------------------------------------------------------------ Chronic Back pain: Patient was last seen in the office for this problem 1 year ago. Patient reports her pain has worsened. She states she is unable to afford going to the pain clinic. She  continues to follow up with Dr. Sharlet Salina, but states last injecttion didn't help.   Osteoporosis: Patient was last seen for this problem 1 year ago and no changes were made. She has been taking alendronate consistently since it was started in 2018 and is tolerating well  She does have history of adenomatous polyps last excised in 2013 and referral was initiated to [place]. However patient states when GI office called they decided not to proceed due to her ago.   Follow up irritable bladder.  She continue on oxybutynin and reports it is working very well and she wishes to continue taking it.   Review of Systems  Constitutional: Positive for activity change. Negative for chills, fatigue and fever.  HENT: Positive for tinnitus. Negative for congestion, ear pain, rhinorrhea, sneezing and sore throat.   Eyes: Positive for visual disturbance. Negative for pain and redness.  Respiratory: Negative for cough, shortness of breath and wheezing.   Cardiovascular: Negative for chest pain and leg swelling.  Gastrointestinal: Positive for constipation. Negative for abdominal pain, blood in stool, diarrhea and nausea.  Endocrine: Negative for polydipsia and polyphagia.  Genitourinary: Negative.  Negative for dysuria, flank pain, hematuria, pelvic pain, vaginal bleeding and vaginal discharge.  Musculoskeletal: Positive for arthralgias, back pain, gait problem and myalgias. Negative for joint swelling.  Skin: Negative for rash.  Neurological: Positive for light-headedness. Negative for dizziness, tremors, seizures, weakness, numbness and headaches.  Hematological: Negative for adenopathy.  Psychiatric/Behavioral: Negative.  Negative for behavioral problems, confusion and dysphoric mood. The patient is not nervous/anxious and is not hyperactive.     Social History   Socioeconomic History  . Marital status: Divorced    Spouse name:  Not on file  . Number of children: 2  . Years of education: Not on  file  . Highest education level: Bachelor's degree (e.g., BA, AB, BS)  Occupational History  . Occupation: Retired  Scientific laboratory technician  . Financial resource strain: Not hard at all  . Food insecurity:    Worry: Never true    Inability: Never true  . Transportation needs:    Medical: No    Non-medical: No  Tobacco Use  . Smoking status: Former Smoker    Last attempt to quit: 04/11/1972    Years since quitting: 46.4  . Smokeless tobacco: Never Used  Substance and Sexual Activity  . Alcohol use: No    Alcohol/week: 0.0 standard drinks  . Drug use: No  . Sexual activity: Not on file  Lifestyle  . Physical activity:    Days per week: Not on file    Minutes per session: Not on file  . Stress: Not at all  Relationships  . Social connections:    Talks on phone: Not on file    Gets together: Not on file    Attends religious service: Not on file    Active member of club or organization: Not on file    Attends meetings of clubs or organizations: Not on file    Relationship status: Not on file  . Intimate partner violence:    Fear of current or ex partner: Not on file    Emotionally abused: Not on file    Physically abused: Not on file    Forced sexual activity: Not on file  Other Topics Concern  . Not on file  Social History Narrative   Retired, divorced, gets regular exercise ([employer]- 3x weekly; walks dog)    Past Medical History:  Diagnosis Date  . Arthritis    osteo; in B knees;   . Bladder disorder   . Chicken pox   . Chronic a-fib   . DVT (deep venous thrombosis) ([place])    H/O  . Dyspnea   . Dysrhythmia    a fib  . Edema   . Hay fever   . Heart murmur   . Hypertension    controlled  . Irregular heart beat   . Seizure ([place])    2 years ago knocked unconscious; hospitalized and diagnosed with seizures; no spells in last year;   . Stroke [facility])    1 month ago (possible stroke)  . Unspecified atrial fibrillation [facility])      Patient Active Problem List   Diagnosis  Date Noted  . Lumbar radiculopathy 05/13/2018  . Chronic pain syndrome 05/13/2018  . Lumbar degenerative disc disease 05/13/2018  . Osteoporosis 08/02/2016  . History of colon polyps 08/02/2016  . Low back pain 06/20/2016  . S/P total knee arthroplasty 03/01/2016  . Knee osteoarthritis 02/16/2016  . Weakness 09/27/2015  . 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 History of CVA (cerebrovascular accident) 02/16/2014  . Seizures ([place]) 02/16/2014  . Chronic leg pain 02/16/2014  . Fatigue 07/02/2013  . HYPERTENSION, BENIGN 12/07/2010  . Atrial fibrillation, chronic 06/29/2009    Past Surgical History:  Procedure Laterality Date  . ablation  [place], [place]  . ABLATION     for irregular heart beat  . bilateral wrist surgery  2003  . BREAST BIOPSY Left    stereo  . BREAST SURGERY    . CATARACT EXTRACTION W/PHACO Right 06/01/2015   Procedure: CATARACT EXTRACTION  PHACO AND INTRAOCULAR LENS PLACEMENT (IOC);  Surgeon: Birder Robson, MD;  Location: [facility] ORS;  Service: Ophthalmology;  Laterality: Right;  [place]: 01:02.0 AP%: 23.1 CDE: 14.33 Fluid lot# [id number]   . CATARACT EXTRACTION W/PHACO Left 06/22/2015   Procedure: CATARACT EXTRACTION PHACO AND INTRAOCULAR LENS PLACEMENT (IOC);  Surgeon: Birder Robson, MD;  Location: [facility] ORS;  Service: Ophthalmology;  Laterality: Left;  [place]: 01:00.7 AP%: 23.1 CDE: 14.01 Lot # [id number]  . COLONOSCOPY    . DILATION AND CURETTAGE OF UTERUS    . JOINT REPLACEMENT    . KNEE ARTHROPLASTY Right 03/01/2016   Procedure: COMPUTER ASSISTED TOTAL KNEE ARTHROPLASTY;  Surgeon: Dereck Leep, MD;  Location: [facility] ORS;  Service: Orthopedics;  Laterality: Right;  . KNEE ARTHROPLASTY Left 04/23/2017   Procedure: COMPUTER ASSISTED TOTAL KNEE ARTHROPLASTY;  Surgeon: Dereck Leep, MD;  Location: [facility] ORS;  Service: Orthopedics;  Laterality: Left;  . TONSILLECTOMY    . TOTAL KNEE ARTHROPLASTY Right     Her family history includes  Arthritis in her paternal grandmother; Atrial fibrillation in her mother; Congestive Heart Failure in her mother; Diabetes in her paternal grandfather; Melanoma in her mother; Stroke in her maternal grandmother and mother.      Current Outpatient Medications:  .  acetaminophen (TYLENOL) 650 MG CR tablet, Take 650 mg by mouth every 8 (eight) hours as needed for pain., Disp: , Rfl:  .  alendronate (FOSAMAX) 70 MG tablet, Take 1 tablet once a week by mouth., Disp: , Rfl:  .  Artificial Tear Solution (SOOTHE XP XTRA PROTECTION OP), Place 1 drop into both eyes 2 (two) times daily as needed., Disp: , Rfl:  .  B Complex-Biotin-FA (EQL B COMPLEX 100) TABS, Take 0.4 mg tablet once daily by mouth, Disp: , Rfl:  .  Calcium-Magnesium-Vitamin D (CALCIUM 1200+D3 PO), Take 1,200 mg by mouth daily., Disp: , Rfl:  .  cholecalciferol (VITAMIN D) 1000 units tablet, Take 1,000 Units by mouth daily., Disp: , Rfl:  .  Coenzyme Q10 (CO Q-10) 100 MG CAPS, Take 100 mg by mouth daily. , Disp: , Rfl:  .  diltiazem (CARDIZEM CD) 240 MG 24 hr capsule, TAKE 1 CAPSULE BY MOUTH EVERY DAY., Disp: 90 capsule, Rfl: 4 .  gabapentin (NEURONTIN) 300 MG capsule, Take one at bedtime for 4 days, then increase to one twice daily, Disp: 60 capsule, Rfl: 1 .  Lactobacillus (ACIDOPHILUS PO), Take 1 tablet by mouth at bedtime. , Disp: , Rfl:  .  lidocaine (ASPERCREME W/LIDOCAINE) 4 % cream, Apply 1 application topically daily as needed. Apply to affected area for pain, Disp: , Rfl:  .  MEGARED OMEGA-3 KRILL OIL PO, Take 300 mg by mouth daily. , Disp: , Rfl:  .  metoprolol tartrate (LOPRESSOR) 25 MG tablet, TAKE 1 TABLET BY MOUTH TWICE A DAY. (Patient taking differently: Take 25 mg by mouth daily. ), Disp: 180 tablet, Rfl: 4 .  oxybutynin (DITROPAN) 5 MG tablet, TAKE 1 TABLET BY MOUTH 2 TIMES DAILY, Disp: 180 tablet, Rfl: 3 .  oxyCODONE-acetaminophen (PERCOCET) 7.5-325 MG tablet, Take 1 tablet by mouth every 4 (four) hours as needed for severe  pain., Disp: 30 tablet, Rfl: 0 .  traMADol (ULTRAM) 50 MG tablet, Take 1-2 tablets (50-100 mg total) by mouth every 8 (eight) hours as needed for moderate pain., Disp: 60 tablet, Rfl: 1 .  TURMERIC PO, Take 800 mg by mouth 2 (two) times daily., Disp: , Rfl:  .  XARELTO 20 MG TABS tablet,  TAKE 1 TABLET BY MOUTH DAILY WITH SUPPER, Disp: 90 tablet, Rfl: 4  Patient Care Team: Birdie Sons, MD as PCP - General (Family Medicine) Vladimir Crofts, MD (Neurology) Minna Merritts, MD as Consulting Physician (Cardiology) Sharlet Salina, MD as Referring Physician (Physical Medicine and Rehabilitation) Birder Robson, MD as Referring Physician (Ophthalmology) Marry Guan, Laurice Record, MD (Orthopedic Surgery) Vern Claude, LCSW as [place] [facility]     Objective:   Vitals: BP 120/80 (BP Location: Left Arm, Patient Position: Sitting, Cuff Size: Large)   Pulse 62   Temp 98.1 F (36.7 C) (Oral)   Resp 16   Ht 5\' 1"  (1.549 m)   Wt 153 lb (69.4 kg)   SpO2 99% Comment: room air  BMI 28.91 kg/m   Physical Exam   General Appearance:    Alert, cooperative, no distress, appears stated age  Head:    Normocephalic, without obvious abnormality, atraumatic  Eyes:    PERRL, conjunctiva/corneas clear, EOM's intact, fundi    benign, both eyes  Ears:    Normal TM's and external ear canals, both ears  Nose:   Nares normal, septum midline, mucosa normal, no drainage    or sinus tenderness  Throat:   Lips, mucosa, and tongue normal; teeth and gums normal  Neck:   Supple, symmetrical, trachea midline, no adenopathy;    thyroid:  no enlargement/tenderness/nodules; no carotid   bruit or JVD  Back:     Symmetric, no curvature, ROM normal, no CVA tenderness  Lungs:     Clear to auscultation bilaterally, respirations unlabored  Chest Wall:    No tenderness or deformity   Heart:     Irregularly irregular rhythm. Normal rate\, S1 and S2 normal, no murmur, rub  or gallop  Breast Exam:     normal appearance, no masses or tenderness  Abdomen:     Soft, non-tender, bowel sounds active all four quadrants,    no masses, no organomegaly  Pelvic:    deferred  Extremities:   Extremities normal, atraumatic, no cyanosis or edema, Tender along lumbar spine.   Pulses:   2+ and symmetric all extremities  Skin:   Skin color, texture, turgor normal, no rashes or lesions  Lymph nodes:   Cervical, supraclavicular, and axillary nodes normal  Neurologic:   CNII-XII intact, normal strength, sensation and reflexes    throughout    Activities of Daily Living In your present state of health, do you have any difficulty performing the following activities: 07/17/2018  Hearing? N  Vision? N  Difficulty concentrating or making decisions? Y  Walking or climbing stairs? Y  Comment Due to back pain.   Dressing or bathing? N  Doing errands, shopping? N  Preparing Food and eating ? N  Using the Toilet? N  In the past six months, have you accidently leaked urine? Y  Comment Currently on Oxybutynin.   Do you have problems with loss of bowel control? N  Managing your Medications? N  Managing your Finances? N  Housekeeping or managing your Housekeeping? N  Some recent data might be hidden    Fall Risk Assessment Fall Risk  07/17/2018 05/13/2018 03/04/2018 02/19/2018 01/16/2018  Falls in the past year? Yes No Yes No No  Number falls in past yr: 2 or more - 1 - -  Injury with Fall? No - No - -  Follow up Falls prevention discussed - Falls prevention discussed - -     Depression Screen PHQ 2/9  Scores 08/27/2018 07/24/2018 07/17/2018 05/13/2018  PHQ - 2 Score 0 0 0 0  PHQ- 9 Score - - - -    Cognitive Testing - 6-CIT Cognitive Function: 6CIT Screen 07/17/2018 07/04/2017  What Year? 0 points 0 points  What month? 0 points 0 points  What time? 0 points 0 points  Count back from 20 0 points 0 points  Months in reverse 0 points 2 points  Repeat phrase 0 points 2 points  Total Score 0 4         Assessment & Plan:    Annual Physical Reviewed patient's Family Medical History Reviewed and updated list of patient's medical providers Assessment of cognitive impairment was done Assessed patient's functional ability Established a written schedule for health screening [place] Completed and Reviewed  Exercise Activities and Dietary recommendations Goals    . Eat more fruits and vegetables     Recommend eating more fruits in daily diet and substituting sweets for fruits.     Marland Kitchen LIFESTYLE - DECREASE FALLS RISK     Discussed way to prep home inside and out to help prevent future falls.        Immunization History  Administered Date(s) Administered  . Influenza Split 07/14/2009, 08/02/2010  . Influenza, High Dose Seasonal PF 07/24/2014, 09/22/2015, 08/02/2016, 07/04/2017, 07/17/2018  . Influenza,inj,Quad PF,6+ Mos 07/04/2013  . PPD Test 03/04/2016  . Pneumococcal Conjugate-13 07/24/2014  . Pneumococcal Polysaccharide-23 07/14/2009  . Td 06/16/2008  . Tdap 01/05/2011  . Zoster 11/29/2011    Health Maintenance  Topic Date Due  . COLONOSCOPY  01/16/2017  . DEXA SCAN  09/21/2019  . TETANUS/TDAP  01/04/2021  . INFLUENZA VACCINE  Completed  . PNA vac Low Risk Adult  Completed     Discussed health benefits of physical activity, and encouraged her to engage in regular exercise appropriate for her age and condition.    ------------------------------------------------------------------------------------------------------------  1. Annual physical exam Given contact information to schedule mammogram.   2. HYPERTENSION, BENIGN Well controlled.  Continue current medications.   - Comprehensive metabolic panel - Lipid panel  3. Osteoporosis, unspecified osteoporosis type, unspecified pathological fracture presence She states she has been taking alendronate, but I do not see any dispenses from pharmacy this year. Sent new prescription for  weekly alendronate to her pharmacy.  - VITAMIN D 25 Hydroxy (Vit-D Deficiency, Fractures)  4. Atrial fibrillation, chronic Doing well with anticoagulation with well controlled HR. She is to contact Dr. Donivan Scull office for routine follow up .   5. Seizures ([place]) Stable continue routine follow up Dr. Manuella Ghazi.   6. Chronic bilateral low back pain without sciatica Worsening. Occasional oxycodone/apap provides some relief. Encouraged to return to pain clinic but she states she cannot afford it. She will continue to follow up with Dr. Sharlet Salina.   7. Irritable bladder Well controlled on oxybutynin. Advised of potential to cause sedation and memory difficulties. She wishes to stay on medication for the time being.   8. History of colon polyps She declined follow up colonoscopy this year. Is complicated by being on chronic anticoagulation. Will be on look out for changes in bowel habits.    Lelon Huh, MD  [facility]

## 2018-09-12 NOTE — Addendum Note (Signed)
Addended by: Lelon Huh E on: 09/12/2018 11:29 AM   Modules accepted: Orders

## 2018-09-12 NOTE — Patient Instructions (Addendum)
   The CDC recommends two doses of Shingrix (the shingles vaccine) separated by 2 to 6 months for adults age 76 years and older. I recommend checking with your pharmacy plan regarding coverage for this vaccine.    You are due to see Dr Rockey Situ for cardiology follow up  . Please call the [facility] [phone]) to schedule a routine screening mammogram.

## 2018-09-13 ENCOUNTER — Other Ambulatory Visit: Payer: Self-pay | Admitting: *Deleted

## 2018-09-13 ENCOUNTER — Ambulatory Visit
Admission: RE | Admit: 2018-09-13 | Discharge: 2018-09-13 | Disposition: A | Discharge status: Home / Self Care | Payer: PPO | Source: Ambulatory Visit | Attending: Neurology | Admitting: Neurology

## 2018-09-13 DIAGNOSIS — R42 Dizziness and giddiness: Secondary | ICD-10-CM | POA: Diagnosis not present

## 2018-09-13 DIAGNOSIS — R569 Unspecified convulsions: Secondary | ICD-10-CM

## 2018-09-13 LAB — COMPREHENSIVE METABOLIC PANEL
ALT: 20 IU/L (ref 0–32)
AST: 18 IU/L (ref 0–40)
Albumin/Globulin Ratio: 2.4 — ABNORMAL HIGH (ref 1.2–2.2)
Albumin: 4.3 g/dL (ref 3.5–4.8)
Alkaline Phosphatase: 79 IU/L (ref 39–117)
BUN/Creatinine Ratio: 27 (ref 12–28)
BUN: 20 mg/dL (ref 8–27)
Bilirubin Total: 0.8 mg/dL (ref 0.0–1.2)
CO2: 23 mmol/L (ref 20–29)
Calcium: 9.9 mg/dL (ref 8.7–10.3)
Chloride: 99 mmol/L (ref 96–106)
Creatinine, Ser: 0.74 mg/dL (ref 0.57–1.00)
GFR calc Af Amer: 91 mL/min/{1.73_m2} (ref >59)
GFR calc non Af Amer: 79 mL/min/{1.73_m2} (ref >59)
Globulin, Total: 1.8 g/dL (ref 1.5–4.5)
Glucose: 76 mg/dL (ref 65–99)
Potassium: 3.8 mmol/L (ref 3.5–5.2)
Sodium: 139 mmol/L (ref 134–144)
Total Protein: 6.1 g/dL (ref 6.0–8.5)

## 2018-09-13 LAB — LIPID PANEL
Chol/HDL Ratio: 2.1 ratio (ref 0.0–4.4)
Cholesterol, Total: 178 mg/dL (ref 100–199)
HDL: 86 mg/dL (ref >39)
LDL Calculated: 75 mg/dL (ref 0–99)
Triglycerides: 84 mg/dL (ref 0–149)
VLDL Cholesterol Cal: 17 mg/dL (ref 5–40)

## 2018-09-13 LAB — VITAMIN D 25 HYDROXY (VIT D DEFICIENCY, FRACTURES): Vit D, 25-Hydroxy: 28.3 ng/mL — ABNORMAL LOW (ref 30.0–100.0)

## 2018-09-13 MED ORDER — IOPAMIDOL (ISOVUE-300) INJECTION 61%
75.0000 mL | Freq: Once | INTRAVENOUS | Status: AC | PRN
  Administered 2018-09-13: 75 mL via INTRAVENOUS

## 2018-09-13 NOTE — Patient Outreach (Signed)
[place] [facility]) Care Management  09/13/2018  [name] [name] December 29, 1940 [id number]   Phone call to patient to confirm that she received the Advanced Directive packet.  There was no answer, or voicemail to leave a message.   Plan: This Biomedical scientist will call patient within 1 week to follow up on Advanced directive needs.   Sheralyn Boatman [facility] Care Management [phone]

## 2018-09-13 NOTE — Patient Outreach (Signed)
[place] [facility]) Care Management  09/13/2018  [name] [name] 07-07-1941 [id number]   Return call from patient stating that she is not sure if she received the Advanced Directive packet because she has lost her mail box key. She will be getting a new key on Monday and asked if this social worker can call her back on Monday afternoon tto follow up.  Plan: This social worker will call patient on Monday afternoon to follow up on Advanced Directive packet being received and assistance needs competing it.  Sheralyn Boatman [facility] Care Management [phone]

## 2018-09-16 ENCOUNTER — Other Ambulatory Visit: Payer: Self-pay | Admitting: *Deleted

## 2018-09-16 NOTE — Patient Outreach (Signed)
[place] [facility]) Care Management  09/16/2018  [name] [name] 01/10/1941 [id number]   Phone call to patient to follow up on Advanced Directive packet mailed to her home. Per pateint, she stil has not received her newmailbox key and connaot get into her mail box to check it. Per patient, she should get the key today. Patient requested that I call her back on Monday, 09/23/18.  Plan: This social worker will plan to call patient on Monday., 09/23/18 to follow up on any questions she may have regarding the completion of her Advanced Directive.   Sheralyn Boatman [facility] Care Management [phone]

## 2018-09-23 ENCOUNTER — Telehealth: Payer: Self-pay | Admitting: Family Medicine

## 2018-09-23 ENCOUNTER — Other Ambulatory Visit: Payer: Self-pay | Admitting: *Deleted

## 2018-09-23 DIAGNOSIS — M461 Sacroiliitis, not elsewhere classified: Secondary | ICD-10-CM

## 2018-09-23 DIAGNOSIS — G8929 Other chronic pain: Secondary | ICD-10-CM

## 2018-09-23 DIAGNOSIS — M25552 Pain in left hip: Secondary | ICD-10-CM

## 2018-09-23 DIAGNOSIS — M5432 Sciatica, left side: Secondary | ICD-10-CM

## 2018-09-23 DIAGNOSIS — M545 Low back pain: Principal | ICD-10-CM

## 2018-09-23 NOTE — Telephone Encounter (Signed)
Pt advised.  She would like to proceed with the referral to orthopedics.  She states she usually sees Dr. Marry Guan at [facility] but is okay with seeing someone else if they can get her in sooner.    Thanks,   -Mickel Baas

## 2018-09-23 NOTE — Patient Outreach (Signed)
[facility]) Care Management  09/23/2018  [name] [name] Oct 30, 1940 [id number]   Phone call to patient to confirm that she received the Advanced Directive document mailed. Per patient, she has received her new mailbox key, however the Advanced Directive document mailed to her was not in there. Patient agreed to a home visit to complete this document.   Plan: This social scheduled home visit for 09/24/18 at 1:00pm.   [place], [place] Management [phone]

## 2018-09-23 NOTE — Telephone Encounter (Signed)
Pt needing a call back to discuss other options for her pain.  The Rx she has been put on   oxyCODONE-acetaminophen (PERCOCET) 7.5-325 MG tablet   Is making her mouth very dry and pain is not helping much.  Please advise.  Thanks, [employer]

## 2018-09-23 NOTE — Telephone Encounter (Signed)
Patient calling stating the Oxycodone/apap that was prescribed, is not helping with pain relief, and is causing her to have dry mouth. Patient wants to discuss what other options she has for pain relief. Please advise.

## 2018-09-23 NOTE — Telephone Encounter (Signed)
There is nothing else I know of. She needs referral to orthopedics for further evaluation of back and hip pain.

## 2018-09-24 ENCOUNTER — Other Ambulatory Visit: Payer: Self-pay | Admitting: *Deleted

## 2018-09-24 NOTE — Patient Outreach (Signed)
[facility]) Care Management  [facility] Social Work  09/24/2018  [name] [name] 06/03/1941 [id number]  Subjective:  Patient is a 77 year old female requesting assistance with completing her Advanced Directive. Document mailed to patient, however per patient she did not receive it.   Objective:   Encounter Medications:  Outpatient Encounter Medications as of 09/24/2018  Medication Sig Note  . acetaminophen (TYLENOL) 650 MG CR tablet Take 650 mg by mouth every 8 (eight) hours as needed for pain. 09/03/2018: Takes 1300 Q8H  . alendronate (FOSAMAX) 70 MG tablet Take 1 tablet (70 mg total) by mouth once a week.   . Artificial Tear Solution (SOOTHE XP XTRA PROTECTION OP) Place 1 drop into both eyes 2 (two) times daily as needed.   . B Complex-Biotin-FA (EQL B COMPLEX 100) TABS Take 0.4 mg tablet once daily by mouth   . Calcium-Magnesium-Vitamin D (CALCIUM 1200+D3 PO) Take 1,200 mg by mouth daily.   . cholecalciferol (VITAMIN D) 1000 units tablet Take 1,000 Units by mouth daily.   . Coenzyme Q10 (CO Q-10) 100 MG CAPS Take 100 mg by mouth daily.    Marland Kitchen diltiazem (CARDIZEM CD) 240 MG 24 hr capsule TAKE 1 CAPSULE BY MOUTH EVERY DAY. 09/03/2018: HS  . gabapentin (NEURONTIN) 300 MG capsule Take one at bedtime for 4 days, then increase to one twice daily 09/03/2018: Taking twice daily  . Lactobacillus (ACIDOPHILUS PO) Take 1 tablet by mouth at bedtime.    . lidocaine (ASPERCREME W/LIDOCAINE) 4 % cream Apply 1 application topically daily as needed. Apply to affected area for pain   . MEGARED OMEGA-3 KRILL OIL PO Take 300 mg by mouth daily.    . metoprolol tartrate (LOPRESSOR) 25 MG tablet TAKE 1 TABLET BY MOUTH TWICE A DAY. (Patient taking differently: Take 25 mg by mouth daily. )   . oxybutynin (DITROPAN) 5 MG tablet TAKE 1 TABLET BY MOUTH 2 TIMES DAILY   . oxyCODONE-acetaminophen (PERCOCET) 7.5-325 MG tablet Take 1 tablet by mouth every 4 (four) hours as needed for severe pain.   .  traMADol (ULTRAM) 50 MG tablet Take 1-2 tablets (50-100 mg total) by mouth every 8 (eight) hours as needed for moderate pain. 09/03/2018: 2 tabs every 8 hours  . TURMERIC PO Take 800 mg by mouth 2 (two) times daily.   Alveda Reasons 20 MG TABS tablet TAKE 1 TABLET BY MOUTH DAILY WITH SUPPER    No facility-administered encounter medications on file as of 09/24/2018.     Functional Status:  In your present state of health, do you have any difficulty performing the following activities: 07/17/2018  Hearing? N  Vision? N  Difficulty concentrating or making decisions? Y  Walking or climbing stairs? Y  Comment Due to back pain.   Dressing or bathing? N  Doing errands, shopping? N  Preparing Food and eating ? N  Using the Toilet? N  In the past six months, have you accidently leaked urine? Y  Comment Currently on Oxybutynin.   Do you have problems with loss of bowel control? N  Managing your Medications? N  Managing your Finances? N  Housekeeping or managing your Housekeeping? N  Some recent data might be hidden    Fall/Depression Screening:  PHQ 2/9 Scores 08/27/2018 07/24/2018 07/17/2018 05/13/2018 03/04/2018 02/19/2018 01/16/2018  PHQ - 2 Score 0 0 0 0 0 0 0  PHQ- 9 Score - - - - - - -    Assessment:   Assisted with completion of  Advanced Directive. Patient instructed that the document needs to be notarized. Per patient, she lives in [place] and the office manager is a Patent examiner. She will have the document notarized through her. Once notarized patient to make herself, HCPOA and providers office a copy for their records. Patient verbalized an  understanding of this process.  Patient reported having no transportation needs as her granddaughter takes her to all Neurology appointments.  Patient verbalized having no additional social work needs. This Education officer, museum to sign off at this time. Patient encouraged to call this social worker if there are any further community resource  needs.   Sheralyn Boatman [facility] Care Management [phone]

## 2018-09-30 ENCOUNTER — Other Ambulatory Visit: Payer: Self-pay | Admitting: Family Medicine

## 2018-09-30 DIAGNOSIS — M5416 Radiculopathy, lumbar region: Secondary | ICD-10-CM | POA: Diagnosis not present

## 2018-09-30 DIAGNOSIS — M5136 Other intervertebral disc degeneration, lumbar region: Secondary | ICD-10-CM | POA: Diagnosis not present

## 2018-09-30 DIAGNOSIS — M48062 Spinal stenosis, lumbar region with neurogenic claudication: Secondary | ICD-10-CM | POA: Diagnosis not present

## 2018-10-10 DIAGNOSIS — M5416 Radiculopathy, lumbar region: Secondary | ICD-10-CM | POA: Diagnosis not present

## 2018-10-11 DIAGNOSIS — I69319 Unspecified symptoms and signs involving cognitive functions following cerebral infarction: Secondary | ICD-10-CM | POA: Diagnosis not present

## 2018-10-11 DIAGNOSIS — I671 Cerebral aneurysm, nonruptured: Secondary | ICD-10-CM | POA: Diagnosis not present

## 2018-10-11 DIAGNOSIS — R569 Unspecified convulsions: Secondary | ICD-10-CM | POA: Diagnosis not present

## 2018-10-11 DIAGNOSIS — G8929 Other chronic pain: Secondary | ICD-10-CM | POA: Diagnosis not present

## 2018-10-11 DIAGNOSIS — R2689 Other abnormalities of gait and mobility: Secondary | ICD-10-CM | POA: Diagnosis not present

## 2018-10-11 DIAGNOSIS — M25511 Pain in right shoulder: Secondary | ICD-10-CM | POA: Diagnosis not present

## 2018-10-11 DIAGNOSIS — I480 Paroxysmal atrial fibrillation: Secondary | ICD-10-CM | POA: Diagnosis not present

## 2018-10-15 ENCOUNTER — Other Ambulatory Visit: Payer: Self-pay | Admitting: Student in an Organized Health Care Education/Training Program

## 2018-10-22 ENCOUNTER — Other Ambulatory Visit: Payer: Self-pay | Admitting: Family Medicine

## 2018-10-29 DIAGNOSIS — M5136 Other intervertebral disc degeneration, lumbar region: Secondary | ICD-10-CM | POA: Diagnosis not present

## 2018-10-29 DIAGNOSIS — M48062 Spinal stenosis, lumbar region with neurogenic claudication: Secondary | ICD-10-CM | POA: Diagnosis not present

## 2018-10-29 DIAGNOSIS — M5416 Radiculopathy, lumbar region: Secondary | ICD-10-CM | POA: Diagnosis not present

## 2018-11-04 DIAGNOSIS — M5416 Radiculopathy, lumbar region: Secondary | ICD-10-CM | POA: Diagnosis not present

## 2018-11-07 DIAGNOSIS — M5416 Radiculopathy, lumbar region: Secondary | ICD-10-CM | POA: Diagnosis not present

## 2018-11-08 DIAGNOSIS — M12811 Other specific arthropathies, not elsewhere classified, right shoulder: Secondary | ICD-10-CM | POA: Diagnosis not present

## 2018-11-08 DIAGNOSIS — G8929 Other chronic pain: Secondary | ICD-10-CM | POA: Diagnosis not present

## 2018-11-08 DIAGNOSIS — M25511 Pain in right shoulder: Secondary | ICD-10-CM | POA: Diagnosis not present

## 2018-11-12 ENCOUNTER — Other Ambulatory Visit: Payer: Self-pay | Admitting: Family Medicine

## 2018-11-21 DIAGNOSIS — M48061 Spinal stenosis, lumbar region without neurogenic claudication: Secondary | ICD-10-CM | POA: Diagnosis not present

## 2018-11-28 ENCOUNTER — Telehealth: Payer: Self-pay | Admitting: Family Medicine

## 2018-11-28 NOTE — Telephone Encounter (Signed)
Pt cannot get transportation to [facility] for surgery.  Pt asking for another referral to a doctor that can do the surgery around the [place] area.   Thanks, [employer]

## 2018-11-28 NOTE — Telephone Encounter (Signed)
Need more infomation

## 2018-11-28 NOTE — Telephone Encounter (Signed)
Cancel new referral requested.  Pt found transportation to [facility] for scheduled surgery wirth original referral.  Thanks, [place]

## 2018-11-28 NOTE — Telephone Encounter (Signed)
Fyi.

## 2018-11-29 DIAGNOSIS — R569 Unspecified convulsions: Secondary | ICD-10-CM | POA: Diagnosis not present

## 2018-11-30 DIAGNOSIS — R569 Unspecified convulsions: Secondary | ICD-10-CM | POA: Diagnosis not present

## 2018-12-01 DIAGNOSIS — R569 Unspecified convulsions: Secondary | ICD-10-CM | POA: Diagnosis not present

## 2018-12-06 ENCOUNTER — Telehealth: Payer: Self-pay | Admitting: Family Medicine

## 2018-12-06 NOTE — Telephone Encounter (Signed)
It all depends on the type of surgery and the type of anesthesia. It can be anywhere from 2-7 days. I typically recommend 5 days, but she needs to double check with her surgeon.

## 2018-12-06 NOTE — Telephone Encounter (Signed)
Patient advised and verbally voiced understanding.  

## 2018-12-06 NOTE — Telephone Encounter (Signed)
Pt needing to verify with Dr. Caryn Section the surgeon told her to stop taking herXARELTO 20 MG TABS tablet 7 days prior to minor surgery - Laminectomy.  Pt is thinking 7 days is too long.  Please advise.  Thanks, [employer]

## 2018-12-18 ENCOUNTER — Telehealth: Payer: Self-pay | Admitting: Family Medicine

## 2018-12-18 NOTE — Telephone Encounter (Signed)
Advised Lisa from [facility].   Thanks,   -Mickel Baas

## 2018-12-18 NOTE — Telephone Encounter (Signed)
Please advise 

## 2018-12-18 NOTE — Telephone Encounter (Signed)
All I can find is notes from [place] physical therapy in 2017. If she had any since then it may have been ordered from pain clinic or orthopedist.

## 2018-12-18 NOTE — Telephone Encounter (Signed)
Pt Does not know where she had physical therapy for her back at and she needs the therapy notes from those visits.  She is having day surgery at [facility].  Do we have a copy of her physical therapy notes or can we tell her where she went for PT  Cb# [phone]    Thanks  teri

## 2019-01-02 ENCOUNTER — Telehealth: Payer: Self-pay | Admitting: Family Medicine

## 2019-01-02 MED ORDER — OXYCODONE-ACETAMINOPHEN 7.5-325 MG PO TABS: ORAL_TABLET | ORAL | 0 refills | Status: DC

## 2019-01-02 NOTE — Telephone Encounter (Signed)
fPt called for a refill on the Oxyxodone 7.5-325 or something else.  She said that it does help but not that much.  She is wondering if there is something else she can take.  She is having surgery on the 30th of march for the nerve in her back.  Pt's CB# [place]  Thanks teri

## 2019-01-14 ENCOUNTER — Telehealth: Payer: Self-pay

## 2019-01-27 ENCOUNTER — Telehealth: Payer: Self-pay | Admitting: *Deleted

## 2019-01-27 NOTE — Telephone Encounter (Signed)
Patient states she read an article stating that you can replace your pain medication with 1 Advil and 1 Tylenol for 3 days and get the same effect. Patient wanted Dr. Maralyn Sago opinion on this since she takes Xarelto. Please advise?

## 2019-01-28 NOTE — Telephone Encounter (Signed)
Patient advised and verbally voiced understanding.  

## 2019-01-28 NOTE — Telephone Encounter (Signed)
Tylenol and Advil work as well as oxycodone for some people, but not everyone, but she can't take Advil because she has to take Xarelto and it can cause stomach bleeding.

## 2019-02-11 ENCOUNTER — Other Ambulatory Visit: Payer: Self-pay

## 2019-02-11 NOTE — Telephone Encounter (Signed)
Please review. Thanks!  

## 2019-02-11 NOTE — Telephone Encounter (Signed)
Patient is requesting a refill on oxyCODONE-acetaminophen (PERCOCET) 7.5-325 MG tablet be sent to [facility].

## 2019-02-12 MED ORDER — OXYCODONE-ACETAMINOPHEN 7.5-325 MG PO TABS: ORAL_TABLET | ORAL | 0 refills | Status: DC

## 2019-02-27 ENCOUNTER — Telehealth: Payer: Self-pay | Admitting: Cardiovascular Disease

## 2019-02-27 NOTE — Telephone Encounter (Signed)
Virtual Visit Pre-Appointment Phone Call  "(Name), I am calling you today to discuss your upcoming appointment. We are currently trying to limit exposure to the virus that causes COVID-19 by seeing patients at home rather than in the office."  1. "What is the BEST phone number to call the day of the visit?" - include this in appointment notes  2. Do you have or have access to (through a family member/friend) a smartphone with video capability that we can use for your visit?" a. If yes - list this number in appt notes as cell (if different from BEST phone #) and list the appointment type as a VIDEO visit in appointment notes b. If no - list the appointment type as a PHONE visit in appointment notes  3. Confirm consent - "In the setting of the current Covid19 crisis, you are scheduled for a (phone or video) visit with your provider on (date) at (time).  Just as we do with many in-office visits, in order for you to participate in this visit, we must obtain consent.  If you'd like, I can send this to your mychart (if signed up) or email for you to review.  Otherwise, I can obtain your verbal consent now.  All virtual visits are billed to your insurance company just like a normal visit would be.  By agreeing to a virtual visit, we'd like you to understand that the technology does not allow for your provider to perform an examination, and thus may limit your provider's ability to fully assess your condition. If your provider identifies any concerns that need to be evaluated in person, we will make arrangements to do so.  Finally, though the technology is pretty good, we cannot assure that it will always work on either your or our end, and in the setting of a video visit, we may have to convert it to a phone-only visit.  In either situation, we cannot ensure that we have a secure connection.  Are you willing to proceed?" STAFF: Did the patient verbally acknowledge consent to telehealth visit? Document  YES/NO here: yes  4. Advise patient to be prepared - "Two hours prior to your appointment, go ahead and check your blood pressure, pulse, oxygen saturation, and your weight (if you have the equipment to check those) and write them all down. When your visit starts, your provider will ask you for this information. If you have an Apple Watch or Kardia device, please plan to have heart rate information ready on the day of your appointment. Please have a pen and paper handy nearby the day of the visit as well."  5. Give patient instructions for MyChart download to smartphone OR Doximity/Doxy.me as below if video visit (depending on what platform provider is using)  6. Inform patient they will receive a phone call 15 minutes prior to their appointment time (may be from unknown caller ID) so they should be prepared to answer    [name] [name] has been deemed a candidate for a follow-up tele-health visit to limit community exposure during the Covid-19 pandemic. I spoke with the patient via phone to ensure availability of phone/video source, confirm preferred email & phone number, and discuss instructions and expectations.  I reminded [name] [name] to be prepared with any vital sign and/or heart rhythm information that could potentially be obtained via home monitoring, at the time of her visit. I reminded [name] [name] to expect a phone call prior to  her visit.  Clarisse Gouge 02/27/2019 4:11 PM   INSTRUCTIONS FOR DOWNLOADING THE MYCHART APP TO SMARTPHONE  - The patient must first make sure to have activated MyChart and know their login information - If Apple, go to [employer] and type in MyChart in the search bar and download the app. If Android, ask patient to go to [employer] and type in [place] in the search bar and download the app. The app is free but as with any other app downloads, their phone may require them to verify saved payment information or  Apple/Android password.  - The patient will need to then log into the app with their MyChart username and password, and select [facility] as their healthcare provider to link the account. When it is time for your visit, go to the MyChart app, find appointments, and click Begin Video Visit. Be sure to Select Allow for your device to access the Microphone and Camera for your visit. You will then be connected, and your provider will be with you shortly.  **If they have any issues connecting, or need assistance please contact MyChart service desk [phone])**  **If using a computer, in order to ensure the best quality for their visit they will need to use either of the following Internet Browsers: [employer], or Google Chrome**  IF USING DOXIMITY or DOXY.ME - The patient will receive a link just prior to their visit by text.     FULL LENGTH CONSENT FOR TELE-HEALTH VISIT   I hereby voluntarily request, consent and authorize [facility] and its employed or contracted physicians, physician assistants, nurse practitioners or other licensed health care professionals (the Practitioner), to provide me with telemedicine health care services (the Services") as deemed necessary by the treating Practitioner. I acknowledge and consent to receive the Services by the Practitioner via telemedicine. I understand that the telemedicine visit will involve communicating with the Practitioner through live audiovisual communication technology and the disclosure of certain medical information by electronic transmission. I acknowledge that I have been given the opportunity to request an in-person assessment or other available alternative prior to the telemedicine visit and am voluntarily participating in the telemedicine visit.  I understand that I have the right to withhold or withdraw my consent to the use of telemedicine in the course of my care at any time, without affecting my right to future care  or treatment, and that the Practitioner or I may terminate the telemedicine visit at any time. I understand that I have the right to inspect all information obtained and/or recorded in the course of the telemedicine visit and may receive copies of available information for a reasonable fee.  I understand that some of the potential risks of receiving the Services via telemedicine include:   Delay or interruption in medical evaluation due to technological equipment failure or disruption;  Information transmitted may not be sufficient (e.g. poor resolution of images) to allow for appropriate medical decision making by the Practitioner; and/or   In rare instances, security protocols could fail, causing a breach of personal health information.  Furthermore, I acknowledge that it is my responsibility to provide information about my medical history, conditions and care that is complete and accurate to the best of my ability. I acknowledge that Practitioner's advice, recommendations, and/or decision may be based on factors not within their control, such as incomplete or inaccurate data provided by me or distortions of diagnostic images or specimens that may result from electronic transmissions. I  understand that the practice of medicine is not an exact science and that Practitioner makes no warranties or guarantees regarding treatment outcomes. I acknowledge that I will receive a copy of this consent concurrently upon execution via email to the email address I last provided but may also request a printed copy by calling the office of [facility].    I understand that my insurance will be billed for this visit.   I have read or had this consent read to me.  I understand the contents of this consent, which adequately explains the benefits and risks of the Services being provided via telemedicine.   I have been provided ample opportunity to ask questions regarding this consent and the Services and have had  my questions answered to my satisfaction.  I give my informed consent for the services to be provided through the use of telemedicine in my medical care  By participating in this telemedicine visit I agree to the above.
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
## 2019-03-03 ENCOUNTER — Other Ambulatory Visit: Payer: Self-pay

## 2019-03-03 ENCOUNTER — Telehealth (INDEPENDENT_AMBULATORY_CARE_PROVIDER_SITE_OTHER): Payer: PPO | Admitting: Cardiovascular Disease

## 2019-03-03 DIAGNOSIS — G459 Transient cerebral ischemic attack, unspecified: Secondary | ICD-10-CM

## 2019-03-03 DIAGNOSIS — I482 Chronic atrial fibrillation, unspecified: Secondary | ICD-10-CM | POA: Diagnosis not present

## 2019-03-03 DIAGNOSIS — I10 Essential (primary) hypertension: Secondary | ICD-10-CM | POA: Diagnosis not present

## 2019-03-03 DIAGNOSIS — I483 Typical atrial flutter: Secondary | ICD-10-CM | POA: Diagnosis not present

## 2019-03-03 DIAGNOSIS — I749 Embolism and thrombosis of unspecified artery: Secondary | ICD-10-CM | POA: Diagnosis not present

## 2019-03-03 DIAGNOSIS — M48061 Spinal stenosis, lumbar region without neurogenic claudication: Secondary | ICD-10-CM | POA: Diagnosis not present

## 2019-03-03 DIAGNOSIS — Z01818 Encounter for other preprocedural examination: Secondary | ICD-10-CM | POA: Diagnosis not present

## 2019-03-03 NOTE — Patient Instructions (Signed)

## 2019-03-07 DIAGNOSIS — Z1159 Encounter for screening for other viral diseases: Secondary | ICD-10-CM | POA: Diagnosis not present

## 2019-03-10 DIAGNOSIS — I10 Essential (primary) hypertension: Secondary | ICD-10-CM | POA: Diagnosis not present

## 2019-03-10 DIAGNOSIS — Z86718 Personal history of other venous thrombosis and embolism: Secondary | ICD-10-CM | POA: Diagnosis not present

## 2019-03-10 DIAGNOSIS — Z96653 Presence of artificial knee joint, bilateral: Secondary | ICD-10-CM | POA: Diagnosis not present

## 2019-03-10 DIAGNOSIS — Z79899 Other long term (current) drug therapy: Secondary | ICD-10-CM | POA: Diagnosis not present

## 2019-03-10 DIAGNOSIS — M48061 Spinal stenosis, lumbar region without neurogenic claudication: Secondary | ICD-10-CM | POA: Diagnosis not present

## 2019-03-10 DIAGNOSIS — I4891 Unspecified atrial fibrillation: Secondary | ICD-10-CM | POA: Diagnosis not present

## 2019-03-10 DIAGNOSIS — G8929 Other chronic pain: Secondary | ICD-10-CM | POA: Diagnosis not present

## 2019-03-10 DIAGNOSIS — M5126 Other intervertebral disc displacement, lumbar region: Secondary | ICD-10-CM | POA: Diagnosis not present

## 2019-03-10 DIAGNOSIS — Z8673 Personal history of transient ischemic attack (TIA), and cerebral infarction without residual deficits: Secondary | ICD-10-CM | POA: Diagnosis not present

## 2019-03-10 DIAGNOSIS — Z7901 Long term (current) use of anticoagulants: Secondary | ICD-10-CM | POA: Diagnosis not present

## 2019-03-10 DIAGNOSIS — M4186 Other forms of scoliosis, lumbar region: Secondary | ICD-10-CM | POA: Diagnosis not present

## 2019-03-11 ENCOUNTER — Telehealth: Payer: Self-pay

## 2019-03-11 ENCOUNTER — Inpatient Hospital Stay: Payer: PPO

## 2019-03-11 ENCOUNTER — Other Ambulatory Visit: Payer: Self-pay

## 2019-03-11 ENCOUNTER — Encounter: Payer: Self-pay | Admitting: Emergency Medicine

## 2019-03-11 ENCOUNTER — Emergency Department: Payer: PPO

## 2019-03-11 ENCOUNTER — Inpatient Hospital Stay
Admission: EM | Admit: 2019-03-11 | Discharge: 2019-03-18 | DRG: 871 | Disposition: A | Discharge status: Skilled Nursing Facility | Payer: PPO | Attending: Internal Medicine | Admitting: Internal Medicine

## 2019-03-11 DIAGNOSIS — R29703 NIHSS score 3: Secondary | ICD-10-CM | POA: Diagnosis not present

## 2019-03-11 DIAGNOSIS — I351 Nonrheumatic aortic (valve) insufficiency: Secondary | ICD-10-CM | POA: Diagnosis not present

## 2019-03-11 DIAGNOSIS — I6521 Occlusion and stenosis of right carotid artery: Secondary | ICD-10-CM | POA: Diagnosis not present

## 2019-03-11 DIAGNOSIS — Z7901 Long term (current) use of anticoagulants: Secondary | ICD-10-CM

## 2019-03-11 DIAGNOSIS — Z9842 Cataract extraction status, left eye: Secondary | ICD-10-CM | POA: Diagnosis not present

## 2019-03-11 DIAGNOSIS — I959 Hypotension, unspecified: Secondary | ICD-10-CM | POA: Diagnosis not present

## 2019-03-11 DIAGNOSIS — Z961 Presence of intraocular lens: Secondary | ICD-10-CM | POA: Diagnosis present

## 2019-03-11 DIAGNOSIS — Z886 Allergy status to analgesic agent status: Secondary | ICD-10-CM

## 2019-03-11 DIAGNOSIS — Z8673 Personal history of transient ischemic attack (TIA), and cerebral infarction without residual deficits: Secondary | ICD-10-CM | POA: Diagnosis not present

## 2019-03-11 DIAGNOSIS — Z96653 Presence of artificial knee joint, bilateral: Secondary | ICD-10-CM | POA: Diagnosis not present

## 2019-03-11 DIAGNOSIS — Z79899 Other long term (current) drug therapy: Secondary | ICD-10-CM

## 2019-03-11 DIAGNOSIS — Z7983 Long term (current) use of bisphosphonates: Secondary | ICD-10-CM

## 2019-03-11 DIAGNOSIS — Z79891 Long term (current) use of opiate analgesic: Secondary | ICD-10-CM

## 2019-03-11 DIAGNOSIS — R2689 Other abnormalities of gait and mobility: Secondary | ICD-10-CM | POA: Diagnosis not present

## 2019-03-11 DIAGNOSIS — I63412 Cerebral infarction due to embolism of left middle cerebral artery: Secondary | ICD-10-CM | POA: Diagnosis not present

## 2019-03-11 DIAGNOSIS — Z20828 Contact with and (suspected) exposure to other viral communicable diseases: Secondary | ICD-10-CM | POA: Diagnosis present

## 2019-03-11 DIAGNOSIS — M1990 Unspecified osteoarthritis, unspecified site: Secondary | ICD-10-CM | POA: Diagnosis not present

## 2019-03-11 DIAGNOSIS — I10 Essential (primary) hypertension: Secondary | ICD-10-CM | POA: Diagnosis present

## 2019-03-11 DIAGNOSIS — A419 Sepsis, unspecified organism: Principal | ICD-10-CM | POA: Diagnosis present

## 2019-03-11 DIAGNOSIS — I4891 Unspecified atrial fibrillation: Secondary | ICD-10-CM | POA: Diagnosis present

## 2019-03-11 DIAGNOSIS — Z86718 Personal history of other venous thrombosis and embolism: Secondary | ICD-10-CM | POA: Diagnosis not present

## 2019-03-11 DIAGNOSIS — M170 Bilateral primary osteoarthritis of knee: Secondary | ICD-10-CM | POA: Diagnosis not present

## 2019-03-11 DIAGNOSIS — R41841 Cognitive communication deficit: Secondary | ICD-10-CM | POA: Diagnosis not present

## 2019-03-11 DIAGNOSIS — F801 Expressive language disorder: Secondary | ICD-10-CM | POA: Diagnosis not present

## 2019-03-11 DIAGNOSIS — R4701 Aphasia: Secondary | ICD-10-CM | POA: Diagnosis present

## 2019-03-11 DIAGNOSIS — R011 Cardiac murmur, unspecified: Secondary | ICD-10-CM | POA: Diagnosis not present

## 2019-03-11 DIAGNOSIS — G894 Chronic pain syndrome: Secondary | ICD-10-CM | POA: Diagnosis present

## 2019-03-11 DIAGNOSIS — M5136 Other intervertebral disc degeneration, lumbar region: Secondary | ICD-10-CM | POA: Diagnosis present

## 2019-03-11 DIAGNOSIS — R410 Disorientation, unspecified: Secondary | ICD-10-CM | POA: Diagnosis not present

## 2019-03-11 DIAGNOSIS — Z9841 Cataract extraction status, right eye: Secondary | ICD-10-CM

## 2019-03-11 DIAGNOSIS — I639 Cerebral infarction, unspecified: Secondary | ICD-10-CM | POA: Diagnosis present

## 2019-03-11 DIAGNOSIS — R569 Unspecified convulsions: Secondary | ICD-10-CM | POA: Diagnosis present

## 2019-03-11 DIAGNOSIS — R4182 Altered mental status, unspecified: Secondary | ICD-10-CM | POA: Diagnosis not present

## 2019-03-11 DIAGNOSIS — R262 Difficulty in walking, not elsewhere classified: Secondary | ICD-10-CM | POA: Diagnosis not present

## 2019-03-11 DIAGNOSIS — M6281 Muscle weakness (generalized): Secondary | ICD-10-CM | POA: Diagnosis not present

## 2019-03-11 DIAGNOSIS — M47816 Spondylosis without myelopathy or radiculopathy, lumbar region: Secondary | ICD-10-CM | POA: Diagnosis not present

## 2019-03-11 DIAGNOSIS — Z86010 Personal history of colonic polyps: Secondary | ICD-10-CM | POA: Diagnosis not present

## 2019-03-11 DIAGNOSIS — I482 Chronic atrial fibrillation, unspecified: Secondary | ICD-10-CM | POA: Diagnosis present

## 2019-03-11 DIAGNOSIS — I480 Paroxysmal atrial fibrillation: Secondary | ICD-10-CM | POA: Diagnosis not present

## 2019-03-11 DIAGNOSIS — Z823 Family history of stroke: Secondary | ICD-10-CM

## 2019-03-11 DIAGNOSIS — I340 Nonrheumatic mitral (valve) insufficiency: Secondary | ICD-10-CM | POA: Diagnosis not present

## 2019-03-11 DIAGNOSIS — G4089 Other seizures: Secondary | ICD-10-CM | POA: Diagnosis not present

## 2019-03-11 DIAGNOSIS — I6523 Occlusion and stenosis of bilateral carotid arteries: Secondary | ICD-10-CM | POA: Diagnosis not present

## 2019-03-11 DIAGNOSIS — Z743 Need for continuous supervision: Secondary | ICD-10-CM | POA: Diagnosis not present

## 2019-03-11 DIAGNOSIS — N329 Bladder disorder, unspecified: Secondary | ICD-10-CM | POA: Diagnosis not present

## 2019-03-11 DIAGNOSIS — R279 Unspecified lack of coordination: Secondary | ICD-10-CM | POA: Diagnosis not present

## 2019-03-11 DIAGNOSIS — R531 Weakness: Secondary | ICD-10-CM | POA: Diagnosis not present

## 2019-03-11 DIAGNOSIS — M5127 Other intervertebral disc displacement, lumbosacral region: Secondary | ICD-10-CM | POA: Diagnosis not present

## 2019-03-11 DIAGNOSIS — Z8249 Family history of ischemic heart disease and other diseases of the circulatory system: Secondary | ICD-10-CM

## 2019-03-11 DIAGNOSIS — M48061 Spinal stenosis, lumbar region without neurogenic claudication: Secondary | ICD-10-CM | POA: Diagnosis not present

## 2019-03-11 DIAGNOSIS — Z66 Do not resuscitate: Secondary | ICD-10-CM | POA: Diagnosis not present

## 2019-03-11 DIAGNOSIS — Z87891 Personal history of nicotine dependence: Secondary | ICD-10-CM

## 2019-03-11 DIAGNOSIS — J301 Allergic rhinitis due to pollen: Secondary | ICD-10-CM | POA: Diagnosis not present

## 2019-03-11 DIAGNOSIS — G9340 Encephalopathy, unspecified: Secondary | ICD-10-CM | POA: Diagnosis not present

## 2019-03-11 HISTORY — DX: Sepsis, unspecified organism: A41.9

## 2019-03-11 LAB — COMPREHENSIVE METABOLIC PANEL
ALT: 16 U/L (ref 0–44)
AST: 26 U/L (ref 15–41)
Albumin: 4.3 g/dL (ref 3.5–5.0)
Alkaline Phosphatase: 45 U/L (ref 38–126)
Anion gap: 10 (ref 5–15)
BUN: 16 mg/dL (ref 8–23)
CO2: 26 mmol/L (ref 22–32)
Calcium: 10.3 mg/dL (ref 8.9–10.3)
Chloride: 102 mmol/L (ref 98–111)
Creatinine, Ser: 0.77 mg/dL (ref 0.44–1.00)
GFR calc Af Amer: >60 mL/min (ref >60)
GFR calc non Af Amer: >60 mL/min (ref >60)
Glucose, Bld: 131 mg/dL — ABNORMAL HIGH (ref 70–99)
Potassium: 3.8 mmol/L (ref 3.5–5.1)
Sodium: 138 mmol/L (ref 135–145)
Total Bilirubin: 1.2 mg/dL (ref 0.3–1.2)
Total Protein: 6.9 g/dL (ref 6.5–8.1)

## 2019-03-11 LAB — CBC WITH DIFFERENTIAL/PLATELET
Abs Immature Granulocytes: 0.02 10*3/uL (ref 0.00–0.07)
Basophils Absolute: 0.1 10*3/uL (ref 0.0–0.1)
Basophils Relative: 1 %
Eosinophils Absolute: 0.1 10*3/uL (ref 0.0–0.5)
Eosinophils Relative: 1 %
HCT: 42.8 % (ref 36.0–46.0)
Hemoglobin: 14.2 g/dL (ref 12.0–15.0)
Immature Granulocytes: 0 %
Lymphocytes Relative: 23 %
Lymphs Abs: 2.8 10*3/uL (ref 0.7–4.0)
MCH: 32.6 pg (ref 26.0–34.0)
MCHC: 33.2 g/dL (ref 30.0–36.0)
MCV: 98.4 fL (ref 80.0–100.0)
Monocytes Absolute: 1.6 10*3/uL — ABNORMAL HIGH (ref 0.1–1.0)
Monocytes Relative: 13 %
Neutro Abs: 7.6 10*3/uL (ref 1.7–7.7)
Neutrophils Relative %: 62 %
Platelets: 161 10*3/uL (ref 150–400)
RBC: 4.35 MIL/uL (ref 3.87–5.11)
RDW: 13 % (ref 11.5–15.5)
WBC: 12.2 10*3/uL — ABNORMAL HIGH (ref 4.0–10.5)
nRBC: 0 % (ref 0.0–0.2)

## 2019-03-11 LAB — URINALYSIS, ROUTINE W REFLEX MICROSCOPIC
Bilirubin Urine: NEGATIVE
Glucose, UA: NEGATIVE mg/dL
Hgb urine dipstick: NEGATIVE
Ketones, ur: NEGATIVE mg/dL
Leukocytes,Ua: NEGATIVE
Nitrite: NEGATIVE
Protein, ur: NEGATIVE mg/dL
Specific Gravity, Urine: 1.003 — ABNORMAL LOW (ref 1.005–1.030)
pH: 6 (ref 5.0–8.0)

## 2019-03-11 LAB — LACTIC ACID, PLASMA: Lactic Acid, Venous: 1.2 mmol/L (ref 0.5–1.9)

## 2019-03-11 LAB — SARS CORONAVIRUS 2 BY RT PCR (HOSPITAL ORDER, PERFORMED IN ~~LOC~~ HOSPITAL LAB): SARS Coronavirus 2: NEGATIVE

## 2019-03-11 MED ORDER — SODIUM CHLORIDE 0.9 % IV BOLUS
1000.0000 mL | Freq: Once | INTRAVENOUS | Status: AC
  Administered 2019-03-11: 1000 mL via INTRAVENOUS

## 2019-03-11 MED ORDER — HEPARIN (PORCINE) 25000 UT/250ML-% IV SOLN
1100.0000 [IU]/h | INTRAVENOUS | Status: DC
  Administered 2019-03-12: 650 [IU]/h via INTRAVENOUS
  Administered 2019-03-13: 900 [IU]/h via INTRAVENOUS
  Filled 2019-03-11 (×2): qty 250

## 2019-03-11 MED ORDER — VANCOMYCIN HCL IN DEXTROSE 1-5 GM/200ML-% IV SOLN
1000.0000 mg | Freq: Once | INTRAVENOUS | Status: DC
  Filled 2019-03-11: qty 200

## 2019-03-11 MED ORDER — METRONIDAZOLE IN NACL 5-0.79 MG/ML-% IV SOLN
500.0000 mg | Freq: Once | INTRAVENOUS | Status: AC
  Administered 2019-03-11: 500 mg via INTRAVENOUS
  Filled 2019-03-11: qty 100

## 2019-03-11 MED ORDER — SODIUM CHLORIDE 0.9 % IV SOLN
2.0000 g | Freq: Once | INTRAVENOUS | Status: AC
  Administered 2019-03-11: 2 g via INTRAVENOUS
  Filled 2019-03-11: qty 2

## 2019-03-11 MED ORDER — HEPARIN BOLUS VIA INFUSION
3500.0000 [IU] | Freq: Once | INTRAVENOUS | Status: AC
  Administered 2019-03-12: 3500 [IU] via INTRAVENOUS
  Filled 2019-03-11: qty 3500

## 2019-03-11 MED ORDER — DILTIAZEM HCL 25 MG/5ML IV SOLN
5.0000 mg | Freq: Once | INTRAVENOUS | Status: DC
  Filled 2019-03-11 (×2): qty 5

## 2019-03-11 MED ORDER — VANCOMYCIN HCL 10 G IV SOLR
1500.0000 mg | Freq: Once | INTRAVENOUS | Status: AC
  Administered 2019-03-11: 1500 mg via INTRAVENOUS
  Filled 2019-03-11 (×2): qty 1500
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
## 2019-03-11 NOTE — ED Notes (Signed)
Patient transported to X-ray 

## 2019-03-11 NOTE — ED Notes (Signed)
Patient transported to CT 

## 2019-03-11 NOTE — Progress Notes (Signed)
CODE SEPSIS - PHARMACY COMMUNICATION  **Broad Spectrum Antibiotics should be administered within 1 hour of Sepsis diagnosis**  Time Code Sepsis Called/Page Received: 5/19 @ 1944   Antibiotics Ordered: Vancomycin, Cefepime   Time of 1st antibiotic administration:  5/19 @ 2030   Additional action taken by pharmacy:   If necessary, Name of Provider/Nurse Contacted:     Antoinette Haskett D ,PharmD Clinical Pharmacist  03/11/2019  9:44 PM

## 2019-03-11 NOTE — Telephone Encounter (Signed)
Patient daughter [name] [name] states that her mother is having some confusion after having a procedure with Dr. Marcello Moores Dimmig yesterday. She is eating, and drinking well, but speech is very off, fever 101.0. Provider's office told her to stop Oxycodone because it may be causing some side effects  or dehydration. Daughter states she could not get a straight answer from the nurses there concerning her issues. Please Advise.

## 2019-03-11 NOTE — Progress Notes (Addendum)
ANTICOAGULATION CONSULT NOTE - Initial Consult  Pharmacy Consult for heparin drip Indication: atrial fibrillation  Allergies  Allergen Reactions  . Aspirin Other (See Comments)    On xarelto    Patient Measurements: Height: 5\' 2"  (157.5 cm) Weight: 140 lb (63.5 kg) IBW/kg (Calculated) : 50.1 Heparin Dosing Weight: 55.4 kg  Vital Signs: Temp: 100.9 F (38.3 C) (05/19 1937) Temp Source: Rectal (05/19 1937) BP: 139/76 (05/19 2230) Pulse Rate: 117 (05/19 2230)  Labs: Recent Labs    03/11/19 1944  HGB 14.2  HCT 42.8  PLT 161  CREATININE 0.77    Estimated Creatinine Clearance: 52.4 mL/min (by C-G formula based on SCr of 0.77 mg/dL).   Medical History: Past Medical History:  Diagnosis Date  . Arthritis    osteo; in B knees;   . Bladder disorder   . Chicken pox   . Chronic a-fib   . DVT (deep venous thrombosis) ([place])    H/O  . Dyspnea   . Dysrhythmia    a fib  . Edema   . Hay fever   . Heart murmur   . Hypertension    controlled  . Irregular heart beat   . Seizure ([place])    2 years ago knocked unconscious; hospitalized and diagnosed with seizures; no spells in last year;   . Stroke [facility])    1 month ago (possible stroke)  . Unspecified atrial fibrillation (H[facility])     Medications:  Scheduled:  . heparin  3,500 Units Intravenous Once    Assessment: Patient arrives to ED for aphasia w/ h/o DVT and afib anticoagulated w/ xarelto 20 mg daily PTA (last dose unknown) per note patient was suppose to "start again tonight." CT head negative. EKG pending. Patient is being started on heparin drip for anticoagulation of afib.  Goal of Therapy:  APTT 66 - 102 seconds Heparin level 0.3-0.7 units/ml Monitor platelets by anticoagulation protocol: Yes   Plan:  Will bolus w/ heparin 3500 units IV x 1 Will start rate at 650 units/hr  Baseline labs ordered, PT/INR, aPTT 18.7/1.6; 37 seconds, baseline CBC WNL. Baseline HL 2.22; therefore, will dose based on aPTT until  both levels correlate, then will revert to dosing via HL's.  Will check next aPTT @ 0700; and HL @ 05/21 w/ am labs to assess correlation. Will continue to monitor daily CBC's and adjust per anti-Xa/aPTT levels.  Tobie Lords, PharmD, BCPS Clinical Pharmacist 03/11/2019

## 2019-03-11 NOTE — ED Notes (Signed)
ED TO INPATIENT HANDOFF REPORT  ED Nurse Name and Phone #: Anson Crofts Name/Age/Gender [name] [name] 78 y.o. female Room/Bed: ED19A/ED19A  Code Status   Code Status: Prior  Home/SNF/Other Home Patient oriented to: self, place, time and situation Is this baseline? Yes   Triage Complete: Triage complete  Chief Complaint Post Op Problems  Triage Note Patient ambulatory to triage with steady gait, without difficulty or distress noted, mask in place; pt reports difficulty getting her words out since last night--pt noted to have intermittent expressive aphasia; denies hx of same; denies any numbness or weakness; st had back surgery on Monday and some pain from site but no other symptoms   Allergies Allergies  Allergen Reactions  . Aspirin Other (See Comments)    On xarelto    Level of Care/Admitting Diagnosis ED Disposition    ED Disposition Condition [place] [facility] Area: [place] [[id number]]  Level of Care: Telemetry [5]  Covid Evaluation: N/A  Diagnosis: Sepsis [facility]) [[id number]]  Admitting Physician: Lance Coon [[id number]]  Attending Physician: Lance Coon [phone]  Estimated length of stay: past midnight tomorrow  Certification:: I certify this patient will need inpatient services for at least 2 midnights  Bed request comments: 2a  PT Class (Do Not Modify): Inpatient [101]  PT Acc Code (Do Not Modify): Private [1]       B Medical/Surgery History Past Medical History:  Diagnosis Date  . Arthritis    osteo; in B knees;   . Bladder disorder   . Chicken pox   . Chronic a-fib   . DVT (deep venous thrombosis) ([place])    H/O  . Dyspnea   . Dysrhythmia    a fib  . Edema   . Hay fever   . Heart murmur   . Hypertension    controlled  . Irregular heart beat   . Seizure ([place])    2 years ago knocked unconscious; hospitalized and diagnosed with seizures; no spells in last year;   . Stroke [facility])    1 month ago (possible stroke)   . Unspecified atrial fibrillation [facility])    Past Surgical History:  Procedure Laterality Date  . ablation  [place], [place]  . ABLATION     for irregular heart beat  . bilateral wrist surgery  2003  . BREAST BIOPSY Left    stereo  . BREAST SURGERY    . CATARACT EXTRACTION W/PHACO Right 06/01/2015   Procedure: CATARACT EXTRACTION PHACO AND INTRAOCULAR LENS PLACEMENT (IOC);  Surgeon: Birder Robson, MD;  Location: [facility] ORS;  Service: Ophthalmology;  Laterality: Right;  [place]: 01:02.0 AP%: 23.1 CDE: 14.33 Fluid lot# [id number]   . CATARACT EXTRACTION W/PHACO Left 06/22/2015   Procedure: CATARACT EXTRACTION PHACO AND INTRAOCULAR LENS PLACEMENT (IOC);  Surgeon: Birder Robson, MD;  Location: [facility] ORS;  Service: Ophthalmology;  Laterality: Left;  [place]: 01:00.7 AP%: 23.1 CDE: 14.01 Lot # [id number]  . COLONOSCOPY    . DILATION AND CURETTAGE OF UTERUS    . JOINT REPLACEMENT    . KNEE ARTHROPLASTY Right 03/01/2016   Procedure: COMPUTER ASSISTED TOTAL KNEE ARTHROPLASTY;  Surgeon: Dereck Leep, MD;  Location: [facility] ORS;  Service: Orthopedics;  Laterality: Right;  . KNEE ARTHROPLASTY Left 04/23/2017   Procedure: COMPUTER ASSISTED TOTAL KNEE ARTHROPLASTY;  Surgeon: Dereck Leep, MD;  Location: [facility] ORS;  Service: Orthopedics;  Laterality: Left;  . TONSILLECTOMY    . TOTAL KNEE ARTHROPLASTY Right  A IV Location/Drains/Wounds Patient Lines/Drains/Airways Status   Active Line/Drains/Airways    Name:   Placement date:   Placement time:   Site:   Days:   Peripheral IV 03/11/19 Left Antecubital   03/11/19    1927    Antecubital   less than 1   Peripheral IV 03/11/19 Right Antecubital   03/11/19    2030    Antecubital   less than 1   Incision (Closed) 06/01/15 Eye Right   06/01/15    1123     1379   Incision (Closed) 06/22/15 Eye Left   06/22/15    1123     1358   Incision (Closed) 03/01/16 Knee Right   03/01/16    1209     1105   Incision (Closed) 04/23/17 Knee Left   04/23/17    -      687          Intake/Output Last 24 hours No intake or output data in the 24 hours ending 03/11/19 2313  Labs/Imaging Results for orders placed or performed during the hospital encounter of 03/11/19 (from the past 48 hour(s))  Urinalysis, Routine w reflex microscopic     Status: Abnormal   Collection Time: 03/11/19  5:00 AM  Result Value Ref Range   Color, Urine YELLOW (A) YELLOW   APPearance HAZY (A) CLEAR   Specific Gravity, Urine 1.003 (L) 1.005 - 1.030   pH 6.0 5.0 - 8.0   Glucose, UA NEGATIVE NEGATIVE mg/dL   Hgb urine dipstick NEGATIVE NEGATIVE   Bilirubin Urine NEGATIVE NEGATIVE   Ketones, ur NEGATIVE NEGATIVE mg/dL   Protein, ur NEGATIVE NEGATIVE mg/dL   Nitrite NEGATIVE NEGATIVE   Leukocytes,Ua NEGATIVE NEGATIVE    Comment: Performed at [facility], [address] [place], [place] [address]  Lactic acid, plasma     Status: None   Collection Time: 03/11/19  7:44 PM  Result Value Ref Range   Lactic Acid, Venous 1.2 0.5 - 1.9 mmol/L    Comment: Performed at [facility], [address] [place], [place] [address]  Comprehensive metabolic panel     Status: Abnormal   Collection Time: 03/11/19  7:44 PM  Result Value Ref Range   Sodium 138 135 - 145 mmol/L   Potassium 3.8 3.5 - 5.1 mmol/L   Chloride 102 98 - 111 mmol/L   CO2 26 22 - 32 mmol/L   Glucose, Bld 131 (H) 70 - 99 mg/dL   BUN 16 8 - 23 mg/dL   Creatinine, Ser 0.77 0.44 - 1.00 mg/dL   Calcium 10.3 8.9 - 10.3 mg/dL   Total Protein 6.9 6.5 - 8.1 g/dL   Albumin 4.3 3.5 - 5.0 g/dL   AST 26 15 - 41 U/L   ALT 16 0 - 44 U/L   Alkaline Phosphatase 45 38 - 126 U/L   Total Bilirubin 1.2 0.3 - 1.2 mg/dL   GFR calc non Af Amer >60 >60 mL/min   GFR calc Af Amer >60 >60 mL/min   Anion gap 10 5 - 15    Comment: Performed at [facility], [address] [place], [place] [address]  CBC WITH DIFFERENTIAL     Status: Abnormal   Collection Time: 03/11/19  7:44 PM  Result Value Ref Range    WBC 12.2 (H) 4.0 - 10.5 K/uL   RBC 4.35 3.87 - 5.11 MIL/uL   Hemoglobin 14.2 12.0 - 15.0 g/dL   HCT 42.8 36.0 - 46.0 %  MCV 98.4 80.0 - 100.0 fL   MCH 32.6 26.0 - 34.0 pg   MCHC 33.2 30.0 - 36.0 g/dL   RDW 13.0 11.5 - 15.5 %   Platelets 161 150 - 400 K/uL   nRBC 0.0 0.0 - 0.2 %   Neutrophils Relative % 62 %   Neutro Abs 7.6 1.7 - 7.7 K/uL   Lymphocytes Relative 23 %   Lymphs Abs 2.8 0.7 - 4.0 K/uL   Monocytes Relative 13 %   Monocytes Absolute 1.6 (H) 0.1 - 1.0 K/uL   Eosinophils Relative 1 %   Eosinophils Absolute 0.1 0.0 - 0.5 K/uL   Basophils Relative 1 %   Basophils Absolute 0.1 0.0 - 0.1 K/uL   Immature Granulocytes 0 %   Abs Immature Granulocytes 0.02 0.00 - 0.07 K/uL    Comment: Performed at [facility], [address], [place], [place] [address]  SARS Coronavirus 2 (CEPHEID - Performed in [place] hospital lab), Hosp Order     Status: None   Collection Time: 03/11/19  7:45 PM  Result Value Ref Range   SARS Coronavirus 2 NEGATIVE NEGATIVE    Comment: (NOTE) If result is NEGATIVE SARS-CoV-2 target nucleic acids are NOT DETECTED. The SARS-CoV-2 RNA is generally detectable in upper and lower  respiratory specimens during the acute phase of infection. The lowest  concentration of SARS-CoV-2 viral copies this assay can detect is 250  copies / mL. A negative result does not preclude SARS-CoV-2 infection  and should not be used as the sole basis for treatment or other  patient management decisions.  A negative result may occur with  improper specimen collection / handling, submission of specimen other  than nasopharyngeal swab, presence of viral mutation(s) within the  areas targeted by this assay, and inadequate number of viral copies  (<250 copies / mL). A negative result must be combined with clinical  observations, patient history, and epidemiological information. If result is POSITIVE SARS-CoV-2 target nucleic acids are DETECTED. The SARS-CoV-2 RNA is  generally detectable in upper and lower  respiratory specimens dur ing the acute phase of infection.  Positive  results are indicative of active infection with SARS-CoV-2.  Clinical  correlation with patient history and other diagnostic information is  necessary to determine patient infection status.  Positive results do  not rule out bacterial infection or co-infection with other viruses. If result is PRESUMPTIVE POSTIVE SARS-CoV-2 nucleic acids MAY BE PRESENT.   A presumptive positive result was obtained on the submitted specimen  and confirmed on repeat testing.  While 2019 novel coronavirus  (SARS-CoV-2) nucleic acids may be present in the submitted sample  additional confirmatory testing may be necessary for epidemiological  and / or clinical management purposes  to differentiate between  SARS-CoV-2 and other Sarbecovirus currently known to infect humans.  If clinically indicated additional testing with an alternate test  methodology [phone]) is advised. The SARS-CoV-2 RNA is generally  detectable in upper and lower respiratory sp ecimens during the acute  phase of infection. The expected result is Negative. Fact Sheet for Patients:  StrictlyIdeas.no Fact Sheet for Healthcare Providers: BankingDealers.co.za This test is not yet approved or cleared by the [place] FDA and has been authorized for detection and/or diagnosis of SARS-CoV-2 by FDA under an Emergency Use Authorization (EUA).  This EUA will remain in effect (meaning this test can be used) for the duration of the COVID-19 declaration under Section 564(b)(1) of the Act, 21 U.S.C. section 360bbb-3(b)(1), unless the  authorization is terminated or revoked sooner. Performed at [facility], [place], [place] [address]    Ct Head Wo Contrast  Result Date: 03/11/2019 CLINICAL DATA:  78 year old female with encephalopathy, abnormal speech since last  night. EXAM: CT HEAD WITHOUT CONTRAST TECHNIQUE: Contiguous axial images were obtained from the base of the skull through the vertex without intravenous contrast. COMPARISON:  Head CT 09/13/2018 and earlier. FINDINGS: Brain: Small chronic dural calcifications. Chronic subcortical encephalomalacia in the anterior left superior frontal gyrus is stable. Patchy and scattered bilateral subcortical white matter hypodensity elsewhere appears stable. Stable cerebral volume. No midline shift, ventriculomegaly, mass effect, evidence of mass lesion, intracranial hemorrhage or evidence of cortically based acute infarction. Vascular: Calcified atherosclerosis at the skull base. No suspicious intracranial vascular hyperdensity. Skull: No acute osseous abnormality identified. Sinuses/Orbits: Visualized paranasal sinuses and mastoids are stable and well pneumatized. Other: No acute orbit or scalp soft tissue findings. IMPRESSION: 1. No acute intracranial abnormality. 2. Stable non contrast CT appearance of the brain since 2019 with chronic white matter disease. Electronically Signed   By: Genevie Ann M.D.   On: 03/11/2019 20:31   Dg Chest Port 1 View  Result Date: 03/11/2019 CLINICAL DATA:  78 year old female with altered mental status, fever. EXAM: PORTABLE CHEST 1 VIEW COMPARISON:  Chest radiographs 11/29/2017 and earlier. FINDINGS: AP seated view at 2005 hours. Lower lung volumes. Normal cardiac size and mediastinal contours. Visualized tracheal air column is within normal limits. No pneumothorax, pulmonary edema, pleural effusion or confluent pulmonary opacity. Negative visible bowel gas pattern. No acute osseous abnormality identified. Chronic scoliosis. IMPRESSION: Lower lung volumes.  No acute cardiopulmonary abnormality. Electronically Signed   By: Genevie Ann M.D.   On: 03/11/2019 20:32    Pending Labs Unresulted Labs (From admission, onward)    Start     Ordered   03/11/19 2304  Protime-INR  ONCE - STAT,   STAT      03/11/19 2303   03/11/19 2304  APTT  ONCE - STAT,   STAT     03/11/19 2303   03/11/19 1944  Lactic acid, plasma  Now then every 2 hours,   STAT     03/11/19 1943   03/11/19 1944  Blood Culture (routine x 2)  BLOOD CULTURE X 2,   STAT     03/11/19 1943   03/11/19 1944  Urine culture  ONCE - STAT,   STAT     03/11/19 1943   Signed and Held  Basic metabolic panel  Tomorrow morning,   R     Signed and Held   Signed and Held  CBC  Tomorrow morning,   R     Signed and Held          Vitals/Pain Today's Vitals   03/11/19 2100 03/11/19 2130 03/11/19 2200 03/11/19 2230  BP: (!) 155/85 (!) 153/96 (!) 157/82 139/76  Pulse: (!) 116 (!) 126 (!) 121 (!) 117  Resp: 19 (!) 22 18 18   Temp:      TempSrc:      SpO2: 98% 97% 99% 98%  Weight:      Height:      PainSc:        Isolation Precautions No active isolations  Medications Medications  vancomycin (VANCOCIN) 1,500 mg in sodium chloride 0.9 % 500 mL IVPB (1,500 mg Intravenous New Bag/Given 03/11/19 2226)  heparin bolus via infusion 3,500 Units (has no administration in time range)  heparin ADULT infusion 100 units/mL (  25000 units/284mL sodium chloride 0.45%) (has no administration in time range)  ceFEPIme (MAXIPIME) 2 g in sodium chloride 0.9 % 100 mL IVPB (0 g Intravenous Stopped 03/11/19 2128)  metroNIDAZOLE (FLAGYL) IVPB 500 mg (0 mg Intravenous Stopped 03/11/19 2152)  sodium chloride 0.9 % bolus 1,000 mL (0 mLs Intravenous Stopped 03/11/19 2227)    Mobility walks with device Low fall risk   Focused Assessments Neuro Assessment Handoff:  Swallow screen pass? Yes  Cardiac Rhythm: Atrial fibrillation NIH Stroke Scale ( + Modified Stroke Scale Criteria)  Interval: Initial Level of Consciousness (1a.)   : Not alert, but arousable by minor stimulation to obey, answer, or respond LOC Questions (1b. )   +: Answers neither question correctly LOC Commands (1c. )   + : Performs both tasks correctly Best Gaze (2. )  +: Normal Visual  (3. )  +: No visual loss Facial Palsy (4. )    : Normal symmetrical movements Motor Arm, Left (5a. )   +: No drift Motor Arm, Right (5b. )   +: No drift Motor Leg, Left (6a. )   +: No drift Motor Leg, Right (6b. )   +: No drift Limb Ataxia (7. ): Absent Sensory (8. )   +: Normal, no sensory loss Best Language (9. )   +: Mild-to-moderate aphasia Dysarthria (10. ): Mild-to-moderate dysarthria, patient slurs at least some words and, at worst, can be understood with some difficulty Extinction/Inattention (11.)   +: No Abnormality Modified SS Total  +: 3 Complete NIHSS TOTAL: 5     Neuro Assessment:   Neuro Checks:   Initial (03/11/19 1940)  Last Documented NIHSS Modified Score: 3 (03/11/19 1940) Has TPA been given? No If patient is a Neuro Trauma and patient is going to OR before floor call report to [place] nurse: [phone] or [phone]     R Recommendations: See Admitting Provider Note  Report given to:   Additional Notes:

## 2019-03-11 NOTE — ED Triage Notes (Addendum)
Patient ambulatory to triage with steady gait, without difficulty or distress noted, mask in place; pt reports difficulty getting her words out since last night--pt noted to have intermittent expressive aphasia; denies hx of same; denies any numbness or weakness; st had back surgery on Monday and some pain from site but no other symptoms

## 2019-03-11 NOTE — Telephone Encounter (Signed)
Spoke with daughter [name] and gave her the information.

## 2019-03-11 NOTE — H&P (Signed)
[facility] NAME: [name] [name]    MR#:  [id number]  DATE OF BIRTH:  01-Sep-1941  DATE OF ADMISSION:  03/11/2019  PRIMARY CARE PHYSICIAN: Birdie Sons, MD   REQUESTING/REFERRING PHYSICIAN: Kerman Passey, MD  CHIEF COMPLAINT:   Chief Complaint  Patient presents with  . Aphasia    HISTORY OF PRESENT ILLNESS:  [name] [name]  is a 78 y.o. female who presents with chief complaint as above.  Patient brought to the ED for evaluation for complaint of confusion and expressive aphasia.  Family states that the patient was having difficulty getting all of her words out today, and that she seemed confused at times.  She recently underwent laminectomy for degenerative disc disease of her lumbar spine.  Patient states this was done 3 days ago.  Here in the ED she was also found to be febrile, tachycardic, though she has chronic A. fib she was found near to be in RVR, and to have an elevated white blood cell count.  Chest x-ray was unremarkable, and her urine did not look infected.  Cultures were sent from the ED, she was given antibiotics, and hospitalist were called for admission  PAST MEDICAL HISTORY:   Past Medical History:  Diagnosis Date  . Arthritis    osteo; in B knees;   . Bladder disorder   . Chicken pox   . Chronic a-fib   . DVT (deep venous thrombosis) ([place])    H/O  . Dyspnea   . Dysrhythmia    a fib  . Edema   . Hay fever   . Heart murmur   . Hypertension    controlled  . Irregular heart beat   . Seizure ([place])    2 years ago knocked unconscious; hospitalized and diagnosed with seizures; no spells in last year;   . Stroke [facility])    1 month ago (possible stroke)  . Unspecified atrial fibrillation ([place])      PAST SURGICAL HISTORY:   Past Surgical History:  Procedure Laterality Date  . ablation  [place], [place]  . ABLATION     for irregular heart beat  . bilateral wrist surgery  2003  .  BREAST BIOPSY Left    stereo  . BREAST SURGERY    . CATARACT EXTRACTION W/PHACO Right 06/01/2015   Procedure: CATARACT EXTRACTION PHACO AND INTRAOCULAR LENS PLACEMENT (IOC);  Surgeon: Birder Robson, MD;  Location: [facility] ORS;  Service: Ophthalmology;  Laterality: Right;  [place]: 01:02.0 AP%: 23.1 CDE: 14.33 Fluid lot# [id number]   . CATARACT EXTRACTION W/PHACO Left 06/22/2015   Procedure: CATARACT EXTRACTION PHACO AND INTRAOCULAR LENS PLACEMENT (IOC);  Surgeon: Birder Robson, MD;  Location: [facility] ORS;  Service: Ophthalmology;  Laterality: Left;  [place]: 01:00.7 AP%: 23.1 CDE: 14.01 Lot # [id number]  . COLONOSCOPY    . DILATION AND CURETTAGE OF UTERUS    . JOINT REPLACEMENT    . KNEE ARTHROPLASTY Right 03/01/2016   Procedure: COMPUTER ASSISTED TOTAL KNEE ARTHROPLASTY;  Surgeon: Dereck Leep, MD;  Location: [facility] ORS;  Service: Orthopedics;  Laterality: Right;  . KNEE ARTHROPLASTY Left 04/23/2017   Procedure: COMPUTER ASSISTED TOTAL KNEE ARTHROPLASTY;  Surgeon: Dereck Leep, MD;  Location: [facility] ORS;  Service: Orthopedics;  Laterality: Left;  . TONSILLECTOMY    . TOTAL KNEE ARTHROPLASTY Right      SOCIAL HISTORY:   Social History   Tobacco Use  . Smoking status: Former Smoker  Last attempt to quit: 04/11/1972    Years since quitting: 46.9  . Smokeless tobacco: Never Used  Substance Use Topics  . Alcohol use: No    Alcohol/week: 0.0 standard drinks     FAMILY HISTORY:   Family History  Problem Relation Age of Onset  . Stroke Mother   . Congestive Heart Failure Mother   . Atrial fibrillation Mother   . Melanoma Mother   . Stroke Maternal Grandmother   . Arthritis Paternal Grandmother        RA  . Diabetes Paternal Grandfather      DRUG ALLERGIES:   Allergies  Allergen Reactions  . Aspirin Other (See Comments)    On xarelto    MEDICATIONS AT HOME:   Prior to Admission medications   Medication Sig Start Date End Date Taking? Authorizing Provider  acetaminophen  (TYLENOL) 650 MG CR tablet Take 650 mg by mouth every 8 (eight) hours as needed for pain.   Yes [provider]  alendronate (FOSAMAX) 70 MG tablet Take 1 tablet (70 mg total) by mouth once a week. 09/12/18  Yes Birdie Sons, MD  B Complex-Biotin-FA (EQL B COMPLEX 100) TABS Take 0.4 mg tablet once daily by mouth   Yes [provider]  Calcium-Magnesium-Vitamin D (CALCIUM 1200+D3 PO) Take 1,200 mg by mouth daily.   Yes [provider]  cholecalciferol (VITAMIN D) 1000 units tablet Take 1,000 Units by mouth daily.   Yes [provider]  Coenzyme Q10 (CO Q-10) 100 MG CAPS Take 100 mg by mouth daily.    Yes [provider]  diltiazem (CARDIZEM CD) 240 MG 24 hr capsule TAKE 1 CAPSULE BY MOUTH EVERY DAY. 03/04/18  Yes Birdie Sons, MD  gabapentin (NEURONTIN) 300 MG capsule Take 1 capsule (300 mg total) by mouth 2 (two) times daily. 11/12/18  Yes Birdie Sons, MD  Lactobacillus (ACIDOPHILUS PO) Take 1 tablet by mouth at bedtime.    Yes [provider]  lidocaine (ASPERCREME W/LIDOCAINE) 4 % cream Apply 1 application topically daily as needed. Apply to affected area for pain   Yes [provider]  MEGARED OMEGA-3 KRILL OIL PO Take 300 mg by mouth daily.    Yes [provider]  metoprolol tartrate (LOPRESSOR) 25 MG tablet TAKE 1 TABLET BY MOUTH TWICE A DAY. Patient taking differently: Take 25 mg by mouth daily.  04/02/18  Yes Birdie Sons, MD  oxybutynin (DITROPAN) 5 MG tablet TAKE 1 TABLET BY MOUTH 2 TIMES DAILY 08/08/18  Yes Birdie Sons, MD  oxyCODONE-acetaminophen (PERCOCET) 7.5-325 MG tablet TAKE 1 TABLET BY MOUTH EVERY 4 HOURS AS NEEDED FOR SEVERE PAIN 02/12/19  Yes Birdie Sons, MD  TURMERIC PO Take 800 mg by mouth 2 (two) times daily.   Yes [provider]  XARELTO 20 MG TABS tablet TAKE 1 TABLET BY MOUTH DAILY WITH SUPPER 03/19/18  Yes Fisher, Kirstie Peri, MD    REVIEW OF SYSTEMS:  Review of Systems   Constitutional: Positive for fever and malaise/fatigue. Negative for chills and weight loss.  HENT: Negative for ear pain, hearing loss and tinnitus.   Eyes: Negative for blurred vision, double vision, pain and redness.  Respiratory: Negative for cough, hemoptysis and shortness of breath.   Cardiovascular: Negative for chest pain, palpitations, orthopnea and leg swelling.  Gastrointestinal: Negative for abdominal pain, constipation, diarrhea, nausea and vomiting.  Genitourinary: Negative for dysuria, frequency and hematuria.  Musculoskeletal: Negative for back pain, joint pain and  neck pain.  Skin:       No acne, rash, or lesions  Neurological: Positive for speech change. Negative for dizziness, tremors, focal weakness and weakness.  Endo/Heme/Allergies: Negative for polydipsia. Does not bruise/bleed easily.  Psychiatric/Behavioral: Negative for depression. The patient is not nervous/anxious and does not have insomnia.      VITAL SIGNS:   Vitals:   03/11/19 1930 03/11/19 1937 03/11/19 2015 03/11/19 2039  BP: (!) 127/113  (!) 122/94   Pulse: (!) 120 (!) 128 (!) 123   Resp:   12   Temp:  (!) 100.9 F (38.3 C)    TempSrc:  Rectal    SpO2: 98%  98%   Weight:    63.5 kg  Height:    5\' 2"  (1.575 m)   Wt Readings from Last 3 Encounters:  03/11/19 63.5 kg  09/12/18 69.4 kg  09/03/18 71.7 kg    PHYSICAL EXAMINATION:  Physical Exam  Vitals reviewed. Constitutional: She is oriented to person, place, and time. She appears well-developed and well-nourished. No distress.  HENT:  Head: Normocephalic and atraumatic.  Mouth/Throat: Oropharynx is clear and moist.  Eyes: Pupils are equal, round, and reactive to light. Conjunctivae and EOM are normal. No scleral icterus.  Neck: Normal range of motion. Neck supple. No JVD present. No thyromegaly present.  Cardiovascular: Intact distal pulses. Exam reveals no gallop and no friction rub.  No murmur heard. Tachycardic, irregular rhythm   Respiratory: Effort normal and breath sounds normal. No respiratory distress. She has no wheezes. She has no rales.  GI: Soft. Bowel sounds are normal. She exhibits no distension. There is no abdominal tenderness.  Musculoskeletal: Normal range of motion.        General: No edema.     Comments: No arthritis, no gout  Lymphadenopathy:    She has no cervical adenopathy.  Neurological: She is alert and oriented to person, place, and time. No cranial nerve deficit.  Neurologic: Cranial nerves II-XII intact, Sensation intact to light touch/pinprick, 5/5 strength in all extremities, no dysarthria, patient does have some intermittent expressive aphasia with some intermittent mild word salad as well, memory intact, no pronator drift  Skin: Skin is warm and dry. No rash noted. No erythema.  Surgical site looks clean and dry  Psychiatric: She has a normal mood and affect. Her behavior is normal. Judgment and thought content normal.    LABORATORY PANEL:   CBC Recent Labs  Lab 03/11/19 1944  WBC 12.2*  HGB 14.2  HCT 42.8  PLT 161   ------------------------------------------------------------------------------------------------------------------  Chemistries  Recent Labs  Lab 03/11/19 1944  NA 138  K 3.8  CL 102  CO2 26  GLUCOSE 131*  BUN 16  CREATININE 0.77  CALCIUM 10.3  AST 26  ALT 16  ALKPHOS 45  BILITOT 1.2   ------------------------------------------------------------------------------------------------------------------  Cardiac Enzymes No results for input(s): TROPONINI in the last 168 hours. ------------------------------------------------------------------------------------------------------------------  RADIOLOGY:  Ct Head Wo Contrast  Result Date: 03/11/2019 CLINICAL DATA:  78 year old female with encephalopathy, abnormal speech since last night. EXAM: CT HEAD WITHOUT CONTRAST TECHNIQUE: Contiguous axial images were obtained from the base of the skull through the  vertex without intravenous contrast. COMPARISON:  Head CT 09/13/2018 and earlier. FINDINGS: Brain: Small chronic dural calcifications. Chronic subcortical encephalomalacia in the anterior left superior frontal gyrus is stable. Patchy and scattered bilateral subcortical white matter hypodensity elsewhere appears stable. Stable cerebral volume. No midline shift, ventriculomegaly, mass effect, evidence of mass lesion, intracranial hemorrhage or evidence  of cortically based acute infarction. Vascular: Calcified atherosclerosis at the skull base. No suspicious intracranial vascular hyperdensity. Skull: No acute osseous abnormality identified. Sinuses/Orbits: Visualized paranasal sinuses and mastoids are stable and well pneumatized. Other: No acute orbit or scalp soft tissue findings. IMPRESSION: 1. No acute intracranial abnormality. 2. Stable non contrast CT appearance of the brain since 2019 with chronic white matter disease. Electronically Signed   By: Genevie Ann M.D.   On: 03/11/2019 20:31   Dg Chest Port 1 View  Result Date: 03/11/2019 CLINICAL DATA:  78 year old female with altered mental status, fever. EXAM: PORTABLE CHEST 1 VIEW COMPARISON:  Chest radiographs 11/29/2017 and earlier. FINDINGS: AP seated view at 2005 hours. Lower lung volumes. Normal cardiac size and mediastinal contours. Visualized tracheal air column is within normal limits. No pneumothorax, pulmonary edema, pleural effusion or confluent pulmonary opacity. Negative visible bowel gas pattern. No acute osseous abnormality identified. Chronic scoliosis. IMPRESSION: Lower lung volumes.  No acute cardiopulmonary abnormality. Electronically Signed   By: Genevie Ann M.D.   On: 03/11/2019 20:32    EKG:   Orders placed or performed during the hospital encounter of 03/11/19  . ED EKG 12-Lead  . ED EKG 12-Lead    IMPRESSION AND PLAN:  Principal Problem:   Sepsis ([place]) -unclear etiology, does not appear to be pulmonary or urinary source, perhaps  bacteremia, perhaps related to her recent surgery.  We will get an MRI of her lumbar spine to evaluate her surgical site.  Cultures sent from the ED.  Lactic acid was within normal limits, blood pressure stable.  IV antibiotics given.  She may possibly need spinal fluid evaluation, though given her recent surgery this is unable to be performed in the ED and would likely have to be performed under direct visualization, which will likely not happen until tomorrow, at which time her spinal fluid is likely to be sterile given the antibiotics.  If she is not showing improvement with treatment, consideration can be given to this additional work-up. Active Problems:   Lumbar degenerative disc disease -status post recent laminectomy and hardware placement.  Work-up as above.  She may need orthopedic surgery consult if it is felt that her surgical site is involved with her sepsis   Expressive aphasia -patient was off her anticoagulation perioperatively.  We have started her on IV heparin here until it is clear whether or not she can swallow safely.  MRI brain ordered to evaluate for the possibility of stroke.  If she does have a stroke she will need full stroke work-up.   Atrial fibrillation with RVR ([facility]) -IV rate controlling medication for now until it is clear she can swallow safely, IV anticoagulation for now   HYPERTENSION, BENIGN -home dose antihypertensives when she is able to take p.o.  Chart review performed and case discussed with ED provider. Labs, imaging and/or ECG reviewed by provider and discussed with patient/family. Management plans discussed with the patient and/or family.  COVID-19 status: Tested negative     DVT PROPHYLAXIS: Systemic anticoagulation  GI PROPHYLAXIS:  None  ADMISSION STATUS: Inpatient     CODE STATUS: Full Code Status History    Date Active Date Inactive Code Status Order ID Comments User Context   04/23/2017 1203 04/25/2017 1530 Full Code [id number]  Dereck Leep, MD  Inpatient      TOTAL TIME TAKING CARE OF THIS PATIENT: 45 minutes.   This patient was evaluated in the context of the global COVID-19 pandemic, which necessitated consideration that  the patient might be at risk for infection with the SARS-CoV-2 virus that causes COVID-19. Institutional protocols and algorithms that pertain to the evaluation of patients at risk for COVID-19 are in a state of rapid change based on information released by regulatory bodies including the CDC and federal and state organizations. These policies and algorithms were followed to the best of this provider's knowledge to date during the patient's care at this facility.  Ethlyn Daniels 03/11/2019, 10:51 PM  [facility]  Office  [phone]  CC: Primary care physician; Birdie Sons, MD  Note:  This document was prepared using Dragon voice recognition software and may include unintentional dictation errors.

## 2019-03-11 NOTE — ED Notes (Signed)
ED Provider at bedside. 

## 2019-03-11 NOTE — Telephone Encounter (Signed)
If she has a fever of 101 and acting confuse, she may be septic and needs to go the ER.

## 2019-03-12 ENCOUNTER — Inpatient Hospital Stay (HOSPITAL_COMMUNITY)
Admit: 2019-03-12 | Discharge: 2019-03-12 | Disposition: A | Discharge status: Home / Self Care | Payer: PPO | Attending: Internal Medicine | Admitting: Internal Medicine

## 2019-03-12 ENCOUNTER — Inpatient Hospital Stay: Payer: PPO

## 2019-03-12 DIAGNOSIS — I639 Cerebral infarction, unspecified: Secondary | ICD-10-CM

## 2019-03-12 DIAGNOSIS — I340 Nonrheumatic mitral (valve) insufficiency: Secondary | ICD-10-CM

## 2019-03-12 DIAGNOSIS — I351 Nonrheumatic aortic (valve) insufficiency: Secondary | ICD-10-CM

## 2019-03-12 LAB — BASIC METABOLIC PANEL
Anion gap: 8 (ref 5–15)
BUN: 16 mg/dL (ref 8–23)
CO2: 24 mmol/L (ref 22–32)
Calcium: 9.3 mg/dL (ref 8.9–10.3)
Chloride: 108 mmol/L (ref 98–111)
Creatinine, Ser: 0.6 mg/dL (ref 0.44–1.00)
GFR calc Af Amer: >60 mL/min (ref >60)
GFR calc non Af Amer: >60 mL/min (ref >60)
Glucose, Bld: 132 mg/dL — ABNORMAL HIGH (ref 70–99)
Potassium: 3.8 mmol/L (ref 3.5–5.1)
Sodium: 140 mmol/L (ref 135–145)

## 2019-03-12 LAB — APTT
aPTT: 37 seconds — ABNORMAL HIGH (ref 24–36)
aPTT: 49 seconds — ABNORMAL HIGH (ref 24–36)
aPTT: 59 seconds — ABNORMAL HIGH (ref 24–36)
aPTT: 58 seconds — ABNORMAL HIGH (ref 24–36)

## 2019-03-12 LAB — LIPID PANEL
Cholesterol: 143 mg/dL (ref 0–200)
HDL: 73 mg/dL (ref >40)
LDL Cholesterol: 67 mg/dL (ref 0–99)
Total CHOL/HDL Ratio: 2 RATIO
Triglycerides: 13 mg/dL (ref <150)
VLDL: 3 mg/dL (ref 0–40)

## 2019-03-12 LAB — CBC
HCT: 40.2 % (ref 36.0–46.0)
Hemoglobin: 13.1 g/dL (ref 12.0–15.0)
MCH: 33 pg (ref 26.0–34.0)
MCHC: 32.6 g/dL (ref 30.0–36.0)
MCV: 101.3 fL — ABNORMAL HIGH (ref 80.0–100.0)
Platelets: 139 10*3/uL — ABNORMAL LOW (ref 150–400)
RBC: 3.97 MIL/uL (ref 3.87–5.11)
RDW: 13.1 % (ref 11.5–15.5)
WBC: 8.8 10*3/uL (ref 4.0–10.5)
nRBC: 0 % (ref 0.0–0.2)

## 2019-03-12 LAB — PROTIME-INR
INR: 1.6 — ABNORMAL HIGH (ref 0.8–1.2)
Prothrombin Time: 18.7 seconds — ABNORMAL HIGH (ref 11.4–15.2)

## 2019-03-12 LAB — HEMOGLOBIN A1C
Hgb A1c MFr Bld: 5 % (ref 4.8–5.6)
Mean Plasma Glucose: 96.8 mg/dL

## 2019-03-12 LAB — PROCALCITONIN: Procalcitonin: <0.1 ng/mL

## 2019-03-12 LAB — LACTIC ACID, PLASMA
Lactic Acid, Venous: 1.1 mmol/L (ref 0.5–1.9)
Lactic Acid, Venous: 2.2 mmol/L (ref 0.5–1.9)

## 2019-03-12 LAB — HEPARIN LEVEL (UNFRACTIONATED): Heparin Unfractionated: 2.22 IU/mL — ABNORMAL HIGH (ref 0.30–0.70)

## 2019-03-12 MED ORDER — METOPROLOL TARTRATE 25 MG PO TABS
25.0000 mg | ORAL_TABLET | Freq: Every day | ORAL | Status: DC
  Administered 2019-03-12 – 2019-03-18 (×7): 25 mg via ORAL
  Filled 2019-03-12 (×7): qty 1

## 2019-03-12 MED ORDER — STROKE: EARLY STAGES OF RECOVERY BOOK
Freq: Once | Status: AC
  Administered 2019-03-12: 05:00:00

## 2019-03-12 MED ORDER — ACETAMINOPHEN 325 MG PO TABS
650.0000 mg | ORAL_TABLET | Freq: Four times a day (QID) | ORAL | Status: DC | PRN
  Administered 2019-03-13 – 2019-03-17 (×2): 650 mg via ORAL
  Filled 2019-03-12 (×2): qty 2

## 2019-03-12 MED ORDER — HEPARIN BOLUS VIA INFUSION
1500.0000 [IU] | Freq: Once | INTRAVENOUS | Status: AC
  Administered 2019-03-12: 1500 [IU] via INTRAVENOUS
  Filled 2019-03-12: qty 1500

## 2019-03-12 MED ORDER — DILTIAZEM HCL ER COATED BEADS 120 MG PO CP24
240.0000 mg | ORAL_CAPSULE | Freq: Every day | ORAL | Status: DC
  Administered 2019-03-12 – 2019-03-18 (×7): 240 mg via ORAL
  Filled 2019-03-12 (×7): qty 2

## 2019-03-12 MED ORDER — HEPARIN BOLUS VIA INFUSION
1000.0000 [IU] | Freq: Once | INTRAVENOUS | Status: AC
  Administered 2019-03-12: 1000 [IU] via INTRAVENOUS
  Filled 2019-03-12: qty 1000

## 2019-03-12 MED ORDER — VANCOMYCIN HCL IN DEXTROSE 1-5 GM/200ML-% IV SOLN
1000.0000 mg | INTRAVENOUS | Status: DC
  Administered 2019-03-13 – 2019-03-14 (×2): 1000 mg via INTRAVENOUS
  Filled 2019-03-12 (×3): qty 200

## 2019-03-12 MED ORDER — ACETAMINOPHEN 650 MG RE SUPP: 650.0000 mg | Freq: Four times a day (QID) | RECTAL | Status: DC | PRN

## 2019-03-12 MED ORDER — ONDANSETRON HCL 4 MG PO TABS: 4.0000 mg | ORAL_TABLET | Freq: Four times a day (QID) | ORAL | Status: DC | PRN

## 2019-03-12 MED ORDER — ONDANSETRON HCL 4 MG/2ML IJ SOLN: 4.0000 mg | Freq: Four times a day (QID) | INTRAMUSCULAR | Status: DC | PRN

## 2019-03-12 MED ORDER — SODIUM CHLORIDE 0.9 % IV SOLN
INTRAVENOUS | Status: AC
  Administered 2019-03-12 – 2019-03-13 (×2): via INTRAVENOUS

## 2019-03-12 MED ORDER — SODIUM CHLORIDE 0.9 % IV SOLN
2.0000 g | Freq: Two times a day (BID) | INTRAVENOUS | Status: DC
  Administered 2019-03-12 – 2019-03-14 (×5): 2 g via INTRAVENOUS
  Filled 2019-03-12 (×7): qty 2

## 2019-03-12 NOTE — TOC Initial Note (Signed)
Transition of Care [facility]) - Initial/Assessment Note    Patient Details  Name: [name] [name] MRN: [id number] Date of Birth: June 27, 1941  [facility] of [facility]) CM/SW Contact:    Shade Flood, LCSW Phone Number: 03/12/2019, 2:08 PM  Clinical Narrative:                  Pt admitted with sepsis and CVA. Pt with expressive aphasia. She is currently on a Heparin drip and IV anbx.  Spoke with dtr, [name], by phone to assess. Pt lives alone in a senior housing apartment, [facility]. She has a walker and shower seat for DME. Normally she has a call button she can use for help but apparently because of Covid, the staff cannot come and help her. One meal a day is provided to pt. Discussed pt's TOC needs with dtr who is hopeful pt will qualify for SNF rehab. Dtr states she cannot accommodate pt at her home because the bedroom and bathroom are up a long flight of stairs. If pt does qualify for SNF, she would like [facility]. Will start workup in the event that pt qualifies. She will need insurance authorization prior to SNF transfer. Discussed idea of [facility] for pt with daughter for when pt does return home.  TOC will follow.   Expected Discharge Plan: Skilled Nursing Facility Barriers to Discharge: Continued Medical Work up   Patient Goals and CMS Choice   CMS Medicare.gov Compare Post Acute Care list provided to:: Patient Represenative (must comment)(dtr, [name]) Choice offered to / list presented to : Adult Children  Expected Discharge Plan and Services Expected Discharge Plan: [place] Acute Care Choice: [place] Living arrangements for the past 2 months: Apartment                                      Prior Living Arrangements/Services Living arrangements for the past 2 months: Apartment Lives with:: Self Patient language and need for interpreter reviewed:: Yes Do you feel safe going back to the place where you  live?: No      Need for Family Participation in Patient Care: No (Comment) Care giver support system in place?: Yes (comment) Current home services: DME Criminal Activity/Legal Involvement Pertinent to Current Situation/Hospitalization: No - Comment as needed  Activities of Daily Living Home Assistive Devices/Equipment: Other (Comment)(UTA) ADL Screening (condition at time of admission) Patient's cognitive ability adequate to safely complete daily activities?: Yes Is the patient deaf or have difficulty hearing?: No Does the patient have difficulty seeing, even when wearing glasses/contacts?: No Does the patient have difficulty concentrating, remembering, or making decisions?: Yes Patient able to express need for assistance with ADLs?: Yes Does the patient have difficulty dressing or bathing?: Yes Independently performs ADLs?: No Communication: Independent Dressing (OT): Needs assistance Is this a change from baseline?: Pre-admission baseline Grooming: Needs assistance Is this a change from baseline?: Pre-admission baseline Feeding: Independent Bathing: Needs assistance Is this a change from baseline?: Pre-admission baseline Toileting: Needs assistance Is this a change from baseline?: Pre-admission baseline In/Out Bed: Needs assistance Is this a change from baseline?: Pre-admission baseline Walks in Home: Needs assistance Is this a change from baseline?: Pre-admission baseline Does the patient have difficulty walking or climbing stairs?: Yes Weakness of Legs: Both Weakness of Arms/Hands: Both  Permission Sought/Granted Permission sought to share information with : Customer service manager  Permission granted to share information with : Yes, Verbal Permission Granted     Permission granted to share info w AGENCY: [place]        Emotional Assessment Appearance:: Appears stated age Attitude/Demeanor/Rapport: Unable to Assess Affect (typically observed): Unable to  Assess Orientation: : Oriented to Self, Oriented to Place, Oriented to Situation Alcohol / Substance Use: Not Applicable Psych Involvement: No (comment)  Admission diagnosis:  Altered mental status, unspecified altered mental status type [R41.82] Sepsis, due to unspecified organism, unspecified whether acute organ dysfunction present [facility]) [A41.9] Patient Active Problem List   Diagnosis Date Noted  . Sepsis ([place]) 03/11/2019  . Expressive aphasia 03/11/2019  . Atrial fibrillation with RVR ([place]) 03/11/2019  . Irritable bladder 09/12/2018  . Lumbar radiculopathy 05/13/2018  . Chronic pain syndrome 05/13/2018  . Lumbar degenerative disc disease 05/13/2018  . Osteoporosis 08/02/2016  . History of colon polyps 08/02/2016  . Low back pain 06/20/2016  . S/P total knee arthroplasty 03/01/2016  . Knee osteoarthritis 02/16/2016  . Weakness 09/27/2015  . 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 History of CVA (cerebrovascular accident) 02/16/2014  . Seizures ([place]) 02/16/2014  . Chronic leg pain 02/16/2014  . Fatigue 07/02/2013  . HYPERTENSION, BENIGN 12/07/2010  . Atrial fibrillation, chronic 06/29/2009   PCP:  Birdie Sons, MD Pharmacy:   [place], [place] - [address] [place] [address] Phone: [phone] Fax: [phone]     Social Determinants of Health (SDOH) Interventions    Readmission Risk Interventions No flowsheet data found.

## 2019-03-12 NOTE — Consult Note (Addendum)
ORTHOPAEDIC CONSULTATION  REQUESTING PHYSICIAN: Nicholes Mango, MD  Chief Complaint: Aphasia  HPI: [name] [name] is a 78 y.o. female is status post laminectomy on 03/10/2019 by Dr. Marcello Moores Dimmig at [facility].  The patient was admitted to [facility] for confusion and expressive aphasia noted by her family.  Upon arrival to the emergency department she was febrile and tachycardic.  She has a history of chronic atrial fibrillation for which she takes Xarelto.  Patient has been off of her Xarelto for her spine surgery.  A fever work-up was performed and she met sepsis criteria due to her tachycardia and fever.  Orthopedics is consulted to evaluate the patient postoperatively.  Past Medical History:  Diagnosis Date  . Arthritis    osteo; in B knees;   . Bladder disorder   . Chicken pox   . Chronic a-fib   . DVT (deep venous thrombosis) ([place])    H/O  . Dyspnea   . Dysrhythmia    a fib  . Edema   . Hay fever   . Heart murmur   . Hypertension    controlled  . Irregular heart beat   . Seizure ([place])    2 years ago knocked unconscious; hospitalized and diagnosed with seizures; no spells in last year;   . Stroke [facility])    1 month ago (possible stroke)  . Unspecified atrial fibrillation [facility])    Past Surgical History:  Procedure Laterality Date  . ablation  [place], [place]  . ABLATION     for irregular heart beat  . bilateral wrist surgery  2003  . BREAST BIOPSY Left    stereo  . BREAST SURGERY    . CATARACT EXTRACTION W/PHACO Right 06/01/2015   Procedure: CATARACT EXTRACTION PHACO AND INTRAOCULAR LENS PLACEMENT (IOC);  Surgeon: Birder Robson, MD;  Location: [facility] ORS;  Service: Ophthalmology;  Laterality: Right;  [place]: 01:02.0 AP%: 23.1 CDE: 14.33 Fluid lot# [id number]   . CATARACT EXTRACTION W/PHACO Left 06/22/2015   Procedure: CATARACT EXTRACTION PHACO AND INTRAOCULAR LENS PLACEMENT (IOC);  Surgeon: Birder Robson, MD;  Location: [facility] ORS;  Service:  Ophthalmology;  Laterality: Left;  [place]: 01:00.7 AP%: 23.1 CDE: 14.01 Lot # [id number]  . COLONOSCOPY    . DILATION AND CURETTAGE OF UTERUS    . JOINT REPLACEMENT    . KNEE ARTHROPLASTY Right 03/01/2016   Procedure: COMPUTER ASSISTED TOTAL KNEE ARTHROPLASTY;  Surgeon: Dereck Leep, MD;  Location: [facility] ORS;  Service: Orthopedics;  Laterality: Right;  . KNEE ARTHROPLASTY Left 04/23/2017   Procedure: COMPUTER ASSISTED TOTAL KNEE ARTHROPLASTY;  Surgeon: Dereck Leep, MD;  Location: [facility] ORS;  Service: Orthopedics;  Laterality: Left;  . TONSILLECTOMY    . TOTAL KNEE ARTHROPLASTY Right    Social History   Socioeconomic History  . Marital status: Divorced    Spouse name: Not on file  . Number of children: 2  . Years of education: Not on file  . Highest education level: Bachelor's degree (e.g., BA, AB, BS)  Occupational History  . Occupation: Retired  Scientific laboratory technician  . Financial resource strain: Not hard at all  . Food insecurity:    Worry: Never true    Inability: Never true  . Transportation needs:    Medical: No    Non-medical: No  Tobacco Use  . Smoking status: Former Smoker    Last attempt to quit: 04/11/1972    Years since quitting: 46.9  . Smokeless tobacco: Never Used  Substance  and Sexual Activity  . Alcohol use: No    Alcohol/week: 0.0 standard drinks  . Drug use: No  . Sexual activity: Not on file  Lifestyle  . Physical activity:    Days per week: Not on file    Minutes per session: Not on file  . Stress: Not at all  Relationships  . Social connections:    Talks on phone: Not on file    Gets together: Not on file    Attends religious service: Not on file    Active member of club or organization: Not on file    Attends meetings of clubs or organizations: Not on file    Relationship status: Not on file  Other Topics Concern  . Not on file  Social History Narrative   Retired, divorced, gets regular exercise ([employer]- 3x weekly; walks dog)   Family History   Problem Relation Age of Onset  . Stroke Mother   . Congestive Heart Failure Mother   . Atrial fibrillation Mother   . Melanoma Mother   . Stroke Maternal Grandmother   . Arthritis Paternal Grandmother        RA  . Diabetes Paternal Grandfather    Allergies  Allergen Reactions  . Aspirin Other (See Comments)    On xarelto   Prior to Admission medications   Medication Sig Start Date End Date Taking? Authorizing Provider  acetaminophen (TYLENOL) 650 MG CR tablet Take 650 mg by mouth every 8 (eight) hours as needed for pain.   Yes [provider]  alendronate (FOSAMAX) 70 MG tablet Take 1 tablet (70 mg total) by mouth once a week. 09/12/18  Yes Birdie Sons, MD  B Complex-Biotin-FA (EQL B COMPLEX 100) TABS Take 0.4 mg tablet once daily by mouth   Yes [provider]  Calcium-Magnesium-Vitamin D (CALCIUM 1200+D3 PO) Take 1,200 mg by mouth daily.   Yes [provider]  cholecalciferol (VITAMIN D) 1000 units tablet Take 1,000 Units by mouth daily.   Yes [provider]  Coenzyme Q10 (CO Q-10) 100 MG CAPS Take 100 mg by mouth daily.    Yes [provider]  diltiazem (CARDIZEM CD) 240 MG 24 hr capsule TAKE 1 CAPSULE BY MOUTH EVERY DAY. 03/04/18  Yes Birdie Sons, MD  gabapentin (NEURONTIN) 300 MG capsule Take 1 capsule (300 mg total) by mouth 2 (two) times daily. 11/12/18  Yes Birdie Sons, MD  Lactobacillus (ACIDOPHILUS PO) Take 1 tablet by mouth at bedtime.    Yes [provider]  lidocaine (ASPERCREME W/LIDOCAINE) 4 % cream Apply 1 application topically daily as needed. Apply to affected area for pain   Yes [provider]  MEGARED OMEGA-3 KRILL OIL PO Take 300 mg by mouth daily.    Yes [provider]  metoprolol tartrate (LOPRESSOR) 25 MG tablet TAKE 1 TABLET BY MOUTH TWICE A DAY. Patient taking differently: Take 25 mg by mouth daily.  04/02/18  Yes Birdie Sons, MD  oxybutynin (DITROPAN) 5 MG tablet TAKE  1 TABLET BY MOUTH 2 TIMES DAILY 08/08/18  Yes Birdie Sons, MD  oxyCODONE-acetaminophen (PERCOCET) 7.5-325 MG tablet TAKE 1 TABLET BY MOUTH EVERY 4 HOURS AS NEEDED FOR SEVERE PAIN 02/12/19  Yes Birdie Sons, MD  TURMERIC PO Take 800 mg by mouth 2 (two) times daily.   Yes [provider]  XARELTO 20 MG TABS tablet TAKE 1 TABLET BY MOUTH DAILY WITH SUPPER 03/19/18  Yes Fisher, Kirstie Peri, MD  Ct Head Wo Contrast  Result Date: 03/11/2019 CLINICAL DATA:  78 year old female with encephalopathy, abnormal speech since last night. EXAM: CT HEAD WITHOUT CONTRAST TECHNIQUE: Contiguous axial images were obtained from the base of the skull through the vertex without intravenous contrast. COMPARISON:  Head CT 09/13/2018 and earlier. FINDINGS: Brain: Small chronic dural calcifications. Chronic subcortical encephalomalacia in the anterior left superior frontal gyrus is stable. Patchy and scattered bilateral subcortical white matter hypodensity elsewhere appears stable. Stable cerebral volume. No midline shift, ventriculomegaly, mass effect, evidence of mass lesion, intracranial hemorrhage or evidence of cortically based acute infarction. Vascular: Calcified atherosclerosis at the skull base. No suspicious intracranial vascular hyperdensity. Skull: No acute osseous abnormality identified. Sinuses/Orbits: Visualized paranasal sinuses and mastoids are stable and well pneumatized. Other: No acute orbit or scalp soft tissue findings. IMPRESSION: 1. No acute intracranial abnormality. 2. Stable non contrast CT appearance of the brain since 2019 with chronic white matter disease. Electronically Signed   By: Genevie Ann M.D.   On: 03/11/2019 20:31   Mr Brain Wo Contrast  Result Date: 03/12/2019 CLINICAL DATA:  78 y/o  F; confusion and aphasia. EXAM: MRI HEAD WITHOUT CONTRAST TECHNIQUE: Multiplanar, multiecho pulse sequences of the brain and surrounding structures were obtained without intravenous contrast. COMPARISON:   03/11/2019 CT of the head.  10/28/2015 MRI of the head. FINDINGS: Brain: Predominantly cortical reduced diffusion within the left lateral temporal lobe extending into the posterior insula compatible with acute/early subacute infarction. No associated hemorrhage or mass effect. Early confluent nonspecific T2 FLAIR hyperintensities in subcortical and periventricular white matter are compatible with moderate chronic microvascular ischemic changes. Moderate volume loss of the brain. There are small chronic cortical infarctions within the left superior frontal lobe and the right lateral frontal lobe. No extra-axial collection, hydrocephalus, mass effect, or herniation. Vascular: Normal flow voids. Skull and upper cervical spine: Normal marrow signal. Sinuses/Orbits: Negative. Other: Bilateral intra-ocular lens replacement. IMPRESSION: 1. Reduced diffusion within the left lateral temporal lobe extending into left posterior insula compatible with acute/early subacute infarction. No associated hemorrhage or mass effect. 2. Stable moderate chronic microvascular ischemic changes and volume loss of the brain. Small chronic cortical infarcts within left superior frontal lobe and right lateral frontal lobe. These results were called by telephone at the time of interpretation on 03/12/2019 at 1:01 am to Dr. Owens Shark, who verbally acknowledged these results. Electronically Signed   By: Kristine Garbe M.D.   On: 03/12/2019 01:04   Mr Lumbar Spine Wo Contrast  Result Date: 03/12/2019 CLINICAL DATA:  78 y/o F; laminectomy on Monday. Now with sepsis and altered mental status. EXAM: MRI LUMBAR SPINE WITHOUT CONTRAST TECHNIQUE: Multiplanar, multisequence MR imaging of the lumbar spine was performed. No intravenous contrast was administered. COMPARISON:  06/07/2016 lumbar spine MRI. FINDINGS: Segmentation:  Standard. Alignment: Severe dextrocurvature with apex at L2. L5-S1 grade 1 anterolisthesis. Right L5 pars defect, likely  chronic. Vertebrae: Postsurgical changes related to a right L3-4 hemilaminectomy. There is a small fluid collection within the subcutaneous fat at the level of the laminectomy measuring up to 3 cm as well as edema within the paraspinal muscles and the laminectomy bed. No epidural fluid collection is evident on this noncontrast examination. No findings of discitis osteomyelitis. There are left L2-3 and bilateral L5-S1 facet effusions with mild associated facet edema. No bony destructive changes are evident. Conus medullaris and cauda equina: Conus extends to the L1 level. There is mild crowding of the cauda equina at the L2-3 and L3-4 levels due  to spinal canal stenosis. Paraspinal and other soft tissues: As above. Disc levels: T10-11: No significant disc displacement, foraminal stenosis, or spinal canal stenosis. T11-12: In plate marginal osteophytes eccentric to the left foraminal and extraforaminal zone as well as left-greater-than-right facet arthropathy result in moderate left neural foraminal stenosis. No significant spinal canal stenosis. T12-L1: Small left subarticular disc protrusion, endplate marginal osteophytes eccentric to the left foraminal and extraforaminal zone, and facet hypertrophy greater on the left result in mild-to-moderate stenosis of the left lateral recess and the neural foramen. No significant spinal canal stenosis. L1-2: Endplate marginal osteophytes eccentric to the left extraforaminal zone combined with left-greater-than-right facet hypertrophy result in mild left-sided neural foraminal stenosis. No significant spinal canal stenosis. L2-3: Diffuse disc bulge with prominent bilateral facet and ligamentum flavum hypertrophy eccentric to the left. Mild right and moderate left foraminal stenosis. Moderate spinal canal stenosis with crowding of the cauda equina. L3-4: Disc bulge, right hemilaminectomy, and severe bulky right-sided facet hypertrophy. Severe right and mild left neural foraminal  stenosis. Mild residual spinal canal stenosis largely due to right-sided facet hypertrophy effacing the right lateral recess. L4-5: Mild disc bulge with severe right-sided facet hypertrophy as well as endplate marginal osteophytes in the right foraminal and extraforaminal zone. Severe right neural foraminal stenosis. No significant spinal canal stenosis. L5-S1: Grade 1 anterolisthesis with a small uncovered disc bulge mild left facet hypertrophy, and severe right facet hypertrophy. Severe right and mild left neural foraminal stenosis. Partial effacement of the lateral recesses. Mild spinal canal stenosis. IMPRESSION: 1. Expected postsurgical changes related to a right L3-4 hemilaminectomy. Small fluid collection within the subcutaneous fat at the level of surgery is likely a postoperative seroma. No findings of discitis osteomyelitis. No epidural fluid collection on this noncontrast examination. 2. Facet edema and effusions at the left L2-3 and bilateral L5-S1 levels favored to represent degenerative arthritis over septic arthritis. 3. Severe dextroscoliosis of the lumbar spine and grade 1 L5-S1 anterolisthesis. Advanced lumbar spondylosis with prominent upper lumbar left-sided and lower lumbar right-sided disc and facet degenerative changes. Spinal canal stenosis is greatest at the L2-3 level where it is moderate with crowding of the cauda equina. Multilevel high-grade neural foraminal stenosis. Electronically Signed   By: Kristine Garbe M.D.   On: 03/12/2019 01:23   Dg Chest Port 1 View  Result Date: 03/11/2019 CLINICAL DATA:  78 year old female with altered mental status, fever. EXAM: PORTABLE CHEST 1 VIEW COMPARISON:  Chest radiographs 11/29/2017 and earlier. FINDINGS: AP seated view at 2005 hours. Lower lung volumes. Normal cardiac size and mediastinal contours. Visualized tracheal air column is within normal limits. No pneumothorax, pulmonary edema, pleural effusion or confluent pulmonary  opacity. Negative visible bowel gas pattern. No acute osseous abnormality identified. Chronic scoliosis. IMPRESSION: Lower lung volumes.  No acute cardiopulmonary abnormality. Electronically Signed   By: Genevie Ann M.D.   On: 03/11/2019 20:32    Positive ROS: All other systems have been reviewed and were otherwise negative with the exception of those mentioned in the HPI and as above.  Physical Exam: General: Patient is awake, in no acute distress.  She does have word finding difficulty and seems confused.  Patient able to follow commands on exam. Cardiovascular: No pedal edema Skin: Surgical site intact. Neurologic: Sensation intact distally  MUSCULOSKELETAL: Bilateral lower extremities: Patient has intact sensation to light touch although she states she feels numbness in her lower extremities.  It is unclear of whether the patient is confused by my question.  She has  intact motor function distally and can flex and extend her toes, dorsiflex and plantarflex her ankle and flex and extend her knees actively without pain or detectable weakness.  Patient has palpable pedal pulses.  Patient's dressing is clean and dry over her lumbar spine.  Assessment: Expressive aphasia and confusion   Plan: From an orthopedic standpoint the patient is doing well postop.  She not showing significant neurologic symptoms in her lower extremities on exam.  I reviewed the MRI results which show no evidence of suspected abscess.  MRI of the brain demonstrates reduced diffusion with the left lateral temporal lobe compatible with acute/early subacute infarction.    I discussed this case with Dr. Sharyne Richters by phone today.  Dr. Sharyne Richters stated that the patient may restart her Xarelto at this time.  Patient may perform physical therapy when medically appropriate.  Patient is due for follow-up in our office on 03/20/2019 and may keep this appointment.    Thornton Park, MD    03/12/2019 1:21 PM

## 2019-03-12 NOTE — Evaluation (Addendum)
Speech Language Pathology Evaluation Patient Details Name: [name] [name] MRN: [id number] DOB: December 10, 1940 Today's Date: 03/12/2019 Time: 1100-1200 SLP Time Calculation (min) (ACUTE ONLY): 60 min  Problem List:  Patient Active Problem List   Diagnosis Date Noted  . Sepsis ([place]) 03/11/2019  . Expressive aphasia 03/11/2019  . Atrial fibrillation with RVR ([place]) 03/11/2019  . Irritable bladder 09/12/2018  . Lumbar radiculopathy 05/13/2018  . Chronic pain syndrome 05/13/2018  . Lumbar degenerative disc disease 05/13/2018  . Osteoporosis 08/02/2016  . History of colon polyps 08/02/2016  . Low back pain 06/20/2016  . S/P total knee arthroplasty 03/01/2016  . Knee osteoarthritis 02/16/2016  . Weakness 09/27/2015  . 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 History of CVA (cerebrovascular accident) 02/16/2014  . Seizures ([place]) 02/16/2014  . Chronic leg pain 02/16/2014  . Fatigue 07/02/2013  . HYPERTENSION, BENIGN 12/07/2010  . Atrial fibrillation, chronic 06/29/2009   Past Medical History:  Past Medical History:  Diagnosis Date  . Arthritis    osteo; in B knees;   . Bladder disorder   . Chicken pox   . Chronic a-fib   . DVT (deep venous thrombosis) ([place])    H/O  . Dyspnea   . Dysrhythmia    a fib  . Edema   . Hay fever   . Heart murmur   . Hypertension    controlled  . Irregular heart beat   . Seizure ([place])    2 years ago knocked unconscious; hospitalized and diagnosed with seizures; no spells in last year;   . Stroke [facility])    1 month ago (possible stroke)  . Unspecified atrial fibrillation [facility])    Past Surgical History:  Past Surgical History:  Procedure Laterality Date  . ablation  [place], [place]  . ABLATION     for irregular heart beat  . bilateral wrist surgery  2003  . BREAST BIOPSY Left    stereo  . BREAST SURGERY    . CATARACT EXTRACTION W/PHACO Right 06/01/2015   Procedure: CATARACT EXTRACTION PHACO AND INTRAOCULAR LENS  PLACEMENT (IOC);  Surgeon: Birder Robson, MD;  Location: [facility] ORS;  Service: Ophthalmology;  Laterality: Right;  [place]: 01:02.0 AP%: 23.1 CDE: 14.33 Fluid lot# [id number]   . CATARACT EXTRACTION W/PHACO Left 06/22/2015   Procedure: CATARACT EXTRACTION PHACO AND INTRAOCULAR LENS PLACEMENT (IOC);  Surgeon: Birder Robson, MD;  Location: [facility] ORS;  Service: Ophthalmology;  Laterality: Left;  [place]: 01:00.7 AP%: 23.1 CDE: 14.01 Lot # [id number]  . COLONOSCOPY    . DILATION AND CURETTAGE OF UTERUS    . JOINT REPLACEMENT    . KNEE ARTHROPLASTY Right 03/01/2016   Procedure: COMPUTER ASSISTED TOTAL KNEE ARTHROPLASTY;  Surgeon: Dereck Leep, MD;  Location: [facility] ORS;  Service: Orthopedics;  Laterality: Right;  . KNEE ARTHROPLASTY Left 04/23/2017   Procedure: COMPUTER ASSISTED TOTAL KNEE ARTHROPLASTY;  Surgeon: Dereck Leep, MD;  Location: [facility] ORS;  Service: Orthopedics;  Laterality: Left;  . TONSILLECTOMY    . TOTAL KNEE ARTHROPLASTY Right    HPI:  Pt is a 78 yo female admitted for sepsis, AMS, recent back surgery 5/18 per chart (laminectomy), work up showed MRI + for acute infarct: "reduced diffusion within the left lateral temporal lobe extending into left posterior insula compatible with acute/early subacute infarction". PMH of afib, DVT, heart murmur, seizure, possible stroke 1 month ago. She recently underwent laminectomy for degenerative disc disease of her lumbar spine.  Patient states this  was done 4 days ago.  In the ED she was also found to be febrile, tachycardic, though she has chronic A. fib she was found near to be in RVR, and to have an elevated white blood cell count.  Pt has been Off of her anticoagulation due to recent surgery.  Patient brought to the ED for evaluation for complaint of confusion and difficulty with speech.  Family stated that the patient was having difficulty getting all of her words out and that she seemed confused at times.    Assessment / Plan /  Recommendation Clinical Impression  Pt was seen today at bedside by SLP w/ the Western Aphasia Battery screening tool. Pt presented w/ min fatigue and was often distracted by the beeping of the IV which NSG had to address frequently during session. Pt also indicated discomfort(distraction) from "back hurting me"; repositioning offered/given. NSG reported at one visit that Ortho had been consulted.  Pt presented w/ significant Expressive Aphasia w/ potential Apraxia component; she also appeared to present w/ a level of Receptive Aphasia during more complex tasks. These deficits(Aphasia) appeared to impact her ability to follow through w/ Cognitive tasks such as problem solving situations/questions including requiring cues to indicate use of "911" in an emergency. Due to pt's Expressive Aphasia w/ Receptive Aphasia and the impact on follow through w/ Cognitive tasks/situations, unsure if pt would be able to safely and independently follow through ADLs living alone. Pt's primary Expressive language deficits were noted in Fluency, Repetition, and Naming c/b phonemic and semantic paraphasias w/ perseverations; also possible Apraxia d/t pt's effort/groping w/ sound/word production. Breakdown was noted in Automatic speech and at word/phrase level. Receptive language deficits were noted in more complex tasks on the screening tool: complex Y/Ns and following 3 step commands. Pt was educated on using language strategies to enhance word finding: object description, slowing down, starting over. Use of phonemic and semantic cues did appear to increased accuracy of word recall during tasks discussed post eval.  In light of pt's degree of Expressive Aphasia and concern for min Receptive Aphasia, there is concern for safety if alone at home so SNF placement is recommended; 100% Supervision support/monitoring if returning home alone. Recommend pt have skilled ST Servcies for therapy at Discharge to improve/enhance verbal  communication skills in ADLs to achieve a safe, independent level of living.    SLP Assessment  SLP Recommendation/Assessment: All further Speech Lanaguage Pathology  needs can be addressed in the next venue of care SLP Visit Diagnosis: Aphasia (R47.01)    Follow Up Recommendations  Skilled Nursing facility(possible CIR?) Recommend ST services f/u while in hospital to continue education on strategies for word recall   Frequency and Duration (TBD)  (TBD)      SLP Evaluation Cognition  Overall Cognitive Status: Difficult to assess(but some Cognitive tasks/responses WFL) Arousal/Alertness: Awake/alert Orientation Level: Oriented to place;Oriented to person;Oriented to situation Attention: Focused;Sustained Focused Attention: Appears intact Sustained Attention: Appears intact Memory: Appears intact Awareness: Appears intact Problem Solving: Impaired Problem Solving Impairment: Verbal complex;Functional complex Executive Function: Paediatric nurse Behaviors: Poor frustration tolerance(Fatigued appearing) Safety/Judgment: Impaired Comments: not fully able to assess d/t Expressive language deficits/Aphasia to ensure that full safety awareness for follow through is intact        Comprehension  Auditory Comprehension Overall Auditory Comprehension: Impaired Yes/No Questions: Impaired Commands: Impaired Conversation: Simple Other Conversation Comments: impacted by Expressive language deficits/Aphasia Interfering Components: (Fatigue) EffectiveTechniques: Extra processing time;Repetition;Stressing words Visual Recognition/Discrimination Discrimination: Not tested Reading Comprehension Reading Status:  Not tested    Expression Expression Primary Mode of Expression: Verbal Verbal Expression Overall Verbal Expression: Impaired Initiation: No impairment Automatic Speech: Name Level of Generative/Spontaneous Verbalization: Word Repetition: Impaired Level of Impairment:  Phrase level Naming: Impairment Responsive: 26-50% accurate Confrontation: Impaired Convergent: 0-24% accurate Divergent: 0-24% accurate Verbal Errors: Semantic paraphasias;Phonemic paraphasias;Perseveration;Aware of errors;Not aware of errors(possible Apraxia) Pragmatics: No impairment Effective Techniques: Semantic cues;Phonemic cues Non-Verbal Means of Communication: Gestures Written Expression Dominant Hand: Right Written Expression: Not tested   Oral / Motor  Oral Motor/Sensory Function Overall Oral Motor/Sensory Function: Within functional limits Motor Speech Overall Motor Speech: Appears within functional limits for tasks assessed Respiration: Within functional limits Phonation: Normal Resonance: Within functional limits Articulation: Within functional limitis Intelligibility: Intelligible Motor Planning: Witnin functional limits Motor Speech Errors: Not applicable   GO                      Orinda Kenner, MS, CCC-SLP , 03/12/2019, 4:09 PM

## 2019-03-12 NOTE — Progress Notes (Signed)
Patient already has multiple CVA education handouts in her room already. Will continue to educate patient PRN. Wenda Low [facility]

## 2019-03-12 NOTE — Progress Notes (Signed)
OT Cancellation Note  Patient Details Name: [name] [name] MRN: [id number] DOB: [birth year]   Cancelled Treatment:    Reason Eval/Treat Not Completed: Other (comment). Consult received, chart reviewed. Pt with nursing. Will re-attempt OT evaluation at later date/time as pt is available and medically appropriate.   Jeni Salles, MPH, MS, OTR/L ascom [phone] 03/12/19, 1:32 PM

## 2019-03-12 NOTE — Progress Notes (Signed)
ANTICOAGULATION CONSULT NOTE - Initial Consult  Pharmacy Consult for heparin drip Indication: atrial fibrillation  Allergies  Allergen Reactions  . Aspirin Other (See Comments)    On xarelto    Patient Measurements: Height: 5\' 2"  (157.5 cm) Weight: 156 lb 4.8 oz (70.9 kg) IBW/kg (Calculated) : 50.1 Heparin Dosing Weight: 55.4 kg  Vital Signs: Temp: 98.8 F (37.1 C) (05/20 0835) Temp Source: Oral (05/20 0835) BP: 150/81 (05/20 0835) Pulse Rate: 140 (05/20 0950)  Labs: Recent Labs    03/11/19 1944 03/12/19 0104 03/12/19 0648 03/12/19 1353  HGB 14.2 13.1  --   --   HCT 42.8 40.2  --   --   PLT 161 139*  --   --   APTT  --  37* 49* 59*  LABPROT  --  18.7*  --   --   INR  --  1.6*  --   --   HEPARINUNFRC  --  2.22*  --   --   CREATININE 0.77 0.60  --   --     Estimated Creatinine Clearance: 55.2 mL/min (by C-G formula based on SCr of 0.6 mg/dL).   Medical History: Past Medical History:  Diagnosis Date  . Arthritis    osteo; in B knees;   . Bladder disorder   . Chicken pox   . Chronic a-fib   . DVT (deep venous thrombosis) ([place])    H/O  . Dyspnea   . Dysrhythmia    a fib  . Edema   . Hay fever   . Heart murmur   . Hypertension    controlled  . Irregular heart beat   . Seizure ([place])    2 years ago knocked unconscious; hospitalized and diagnosed with seizures; no spells in last year;   . Stroke [facility])    1 month ago (possible stroke)  . Unspecified atrial fibrillation ([facility])     Medications:  Scheduled:  . diltiazem  240 mg Oral Daily  . diltiazem  5 mg Intravenous Once  . metoprolol tartrate  25 mg Oral Daily    Assessment: Patient arrives to ED for aphasia w/ h/o DVT and afib anticoagulated w/ xarelto 20 mg daily PTA (last dose unknown) per note patient was suppose to "start again tonight." CT head negative. EKG pending. Patient is being started on heparin drip for anticoagulation of afib. Baseline heparin level was elevated at 2.22.  Started  on 650 units/hr  5/20 @0648  aPTT 49 rebolus w/ heparin 1500 units IV x 1 and increase rate to 800 units/hr 5/20 @1353  aPTT 59 rebolus w/ heparin 1000 units x1 and increase rate to 900 units/hr   Goal of Therapy:  APTT 66 - 102 seconds Heparin level 0.3-0.7 units/ml when heparin level and aPTT correlate.  Monitor platelets by anticoagulation protocol: Yes   Plan:  aPTT  subtherapeutic. Will rebolus w/ heparin 1000 units IV x 1 and increase rate to 900 units/hr and will recheck aPTT @2300 . CBC trending down will continue to monitor.   Eleonore Chiquito, PharmD, BCPS Clinical Pharmacist 03/12/2019

## 2019-03-12 NOTE — Progress Notes (Signed)
Pharmacy Antibiotic Note  [name] [name] is a 78 y.o. female admitted on 03/11/2019 with aphasia initially meeting SIRS criteria Tmax 100.9, tachycardic, WBC 12.2 >> 8.8.  Pharmacy has been consulted for vanc/cefepime dosing. Patient received vanc 1.5g IV load and cefepime 2g IV, and flagyl 500 mg IV x 1. Patient was found to have acute infarct on MRI and has a h/o afib and is anticoagulated w/ xarelto 15 mg daily PTA; however, xarelto appears to be held d/t a laminectomy procedure.   Plan: Patient received vanc 1.5g IV load, cefepime 2g, and flagyl 500 mg IV x 1. Vancomycin 1000 mg IV Q 24 hrs. Goal AUC 400-550. Expected AUC: 500.7 SCr used: 0.8 (actual 0.60) Cssmin: 12.3 Will continue cefepime 2g IV q12h per CrCl 30 - 60 ml/min. Will continue to monitor renal fx and s/sx of infx.  Of note CXR negative x infx process, UA non-diagnostic, and MRI of spine shows arthritic vs. infx signs of degeneration.  Height: 5\' 2"  (157.5 cm) Weight: 140 lb (63.5 kg) IBW/kg (Calculated) : 50.1  Temp (24hrs), Avg:99.6 F (37.6 C), Min:98.8 F (37.1 C), Max:100.9 F (38.3 C)  Recent Labs  Lab 03/11/19 1944 03/12/19 0104  WBC 12.2* 8.8  CREATININE 0.77 0.60  LATICACIDVEN 1.2  --     Estimated Creatinine Clearance: 52.4 mL/min (by C-G formula based on SCr of 0.6 mg/dL).    Allergies  Allergen Reactions  . Aspirin Other (See Comments)    On xarelto    Antimicrobials this admission: 05/20 vanc >>  05/20 CFP >>  05/20 FLGL >>  Thank you for allowing pharmacy to be a part of this patient's care.  Tobie Lords, PharmD, BCPS Clinical Pharmacist 03/12/2019

## 2019-03-12 NOTE — Progress Notes (Signed)
Updated family member via phone regarding patient's condition / status per mandatory daily update request. Will continue to monitor. Danique Hartsough M Rahm [employer] 

## 2019-03-12 NOTE — Evaluation (Signed)
Occupational Therapy Evaluation Patient Details Name: [name] [name] MRN: [id number] DOB: 1941/04/12 Today's Date: 03/12/2019    History of Present Illness Pt is 78 yo female admitted for sepsis, AMS, recent back surgery 5/18 per chart (laminectomy), work up showed MRI + for acute infarct, Reduced diffusion within the left lateral temporal lobe extending into left posterior insula compatible with acute/early subacute infarction. PMH of afib, DVT, heart murmur, seizure, possible stroke   Clinical Impression   Pt seen for OT evaluation this date. Per chart review and pt endorsement, pt was independent with basic ADL prior to admission, and the ALF provided at least 1 meal/day. Pt was ambulating with AD prior. Pt currently presents with aphasia. Pt nods when asked if she knows what she wants to say but has difficulty getting it to come out correctly. Pt demonstrates awareness of deficits. Pt also demonstrates impairments in strength globally, cardiopulmonary status (HR in 140's earlier this date), balance, and activity tolerance. Pt currently requires Min-Mod A for LB ADL tasks. Pt will benefit from skilled OT Services to maximize return to PLOF. Currently recommend STR. However, will continue to assess as pt progresses. Full assessment somewhat limited by pt's fatigue.    Follow Up Recommendations  SNF    Equipment Recommendations  3 in 1 bedside commode    Recommendations for Other Services       Precautions / Restrictions Precautions Precautions: Fall Restrictions Weight Bearing Restrictions: No      Mobility Bed Mobility Overal bed mobility: Needs Assistance             General bed mobility comments: pt required assist to scoot up in bed from RN and OT  Transfers                 General transfer comment: deferred 2/2 fatigue    Balance                                           ADL either performed or assessed with clinical judgement    ADL Overall ADL's : Needs assistance/impaired Eating/Feeding: Sitting;Independent   Grooming: Sitting;Set up   Upper Body Bathing: Sitting;Minimal assistance   Lower Body Bathing: Sit to/from stand;Moderate assistance   Upper Body Dressing : Sitting;Minimal assistance   Lower Body Dressing: Sit to/from stand;Moderate assistance                       Vision Patient Visual Report: No change from baseline Vision Assessment?: No apparent visual deficits     Perception     Praxis      Pertinent Vitals/Pain Pain Assessment: Faces Faces Pain Scale: Hurts a little bit Pain Location: pt had difficulty verbalizing pain, but seemed to indicate discomfort all over Pain Descriptors / Indicators: Discomfort;Grimacing Pain Intervention(s): Repositioned;Monitored during session;Limited activity within patient's tolerance     Hand Dominance Right   Extremity/Trunk Assessment Upper Extremity Assessment Upper Extremity Assessment: Generalized weakness(grossly at least 4-/5 bilaterally)   Lower Extremity Assessment Lower Extremity Assessment: Generalized weakness   Cervical / Trunk Assessment Cervical / Trunk Assessment: Other exceptions Cervical / Trunk Exceptions: lumbar laminectomy on 03/10/19   Communication Communication Communication: Expressive difficulties   Cognition Arousal/Alertness: Awake/alert Behavior During Therapy: [facility] for tasks assessed/performed Overall Cognitive Status: Difficult to assess  General Comments: Pt oriented to self, aware of situation but difficulty answering questions due to aphasia   General Comments       Exercises     Shoulder Instructions      Home Living Family/patient expects to be discharged to:: Assisted living                             Home Equipment: Walker - 4 wheels;Shower seat - built in(3 wheeled walker)          Prior Functioning/Environment Level of  Independence: Independent with assistive device(s)        Comments: Facility provides at least 1 meal a day, previously independent with AD, pt denies falls in the last 6 months. PLOF confirmed with family due to pt aphasic state. No assistanced needed for bathing/dressing, family lives 24mins away        OT Problem List: Decreased strength;Decreased knowledge of use of DME or AE;Decreased cognition;Cardiopulmonary status limiting activity;Pain;Impaired balance (sitting and/or standing)      OT Treatment/Interventions: Self-care/ADL training;Balance training;Therapeutic exercise;Therapeutic activities;Cognitive remediation/compensation;DME and/or AE instruction;Patient/family education    OT Goals(Current goals can be found in the care plan section) Acute Rehab OT Goals Patient Stated Goal: Pt indicated she wanted to return to normal OT Goal Formulation: With patient Time For Goal Achievement: 03/26/19 Potential to Achieve Goals: Good ADL Goals Pt Will Perform Lower Body Dressing: with min assist;sit to/from stand Pt Will Transfer to Toilet: with min guard assist;ambulating;bedside commode(LRAD for amb)  OT Frequency: Min 1X/week   Barriers to D/C:            Co-evaluation              AM-PAC OT "6 Clicks" Daily Activity     Outcome Measure Help from another person eating meals?: None Help from another person taking care of personal grooming?: None Help from another person toileting, which includes using toliet, bedpan, or urinal?: A Little Help from another person bathing (including washing, rinsing, drying)?: A Lot Help from another person to put on and taking off regular upper body clothing?: A Little Help from another person to put on and taking off regular lower body clothing?: A Lot 6 Click Score: 18   End of Session    Activity Tolerance: Patient tolerated treatment well;Patient limited by fatigue Patient left: in bed;with call bell/phone within reach;with bed  alarm set  OT Visit Diagnosis: Other abnormalities of gait and mobility (R26.89);Muscle weakness (generalized) (M62.81);Pain;Cognitive communication deficit (R41.841) Symptoms and signs involving cognitive functions: Cerebral infarction                Time: 2774-1287 OT Time Calculation (min): 20 min Charges:  OT General Charges $OT Visit: 1 Visit OT Evaluation $OT Eval Moderate Complexity: 1 Mod  Jeni Salles, MPH, MS, OTR/L ascom [phone] 03/12/19, 3:28 PM

## 2019-03-12 NOTE — Progress Notes (Addendum)
[facility] NAME: [name] [name]    MR#:  [id number]  DATE OF BIRTH:  [birth year]  SUBJECTIVE:  CHIEF COMPLAINT: Patient is very anxious and repeating words, passed swallow evaluation bedside.  Just had lumbar laminectomy on 03/10/2019 Her Xarelto is on hold at this time  REVIEW OF SYSTEMS:  CONSTITUTIONAL: No fever, fatigue or weakness.  EYES: No blurred or double vision.  EARS, NOSE, AND THROAT: No tinnitus or ear pain.  RESPIRATORY: No cough, shortness of breath, wheezing or hemoptysis.  CARDIOVASCULAR: No chest pain, orthopnea, edema.  GASTROINTESTINAL: No nausea, vomiting, diarrhea or abdominal pain.  GENITOURINARY: No dysuria, hematuria.  ENDOCRINE: No polyuria, nocturia,  HEMATOLOGY: No anemia, easy bruising or bleeding SKIN: No rash or lesion. MUSCULOSKELETAL: No joint pain or arthritis.   NEUROLOGIC: No tingling, numbness, weakness.  PSYCHIATRY: No anxiety or depression.   DRUG ALLERGIES:   Allergies  Allergen Reactions  . Aspirin Other (See Comments)    On xarelto    VITALS:  Blood pressure (!) 150/81, pulse (!) 110, temperature 98.8 F (37.1 C), temperature source Oral, resp. rate 18, height '5\' 2"'  (1.575 m), weight 70.9 kg, SpO2 95 %.  PHYSICAL EXAMINATION:  GENERAL:  78 y.o.-year-old patient lying in the bed with no acute distress.  EYES: Pupils equal, round, reactive to light and accommodation. No scleral icterus. Extraocular muscles intact.  HEENT: Head atraumatic, normocephalic. Oropharynx and nasopharynx clear.  NECK:  Supple, no jugular venous distention. No thyroid enlargement, no tenderness.  LUNGS: Normal breath sounds bilaterally, no wheezing, rales,rhonchi or crepitation. No use of accessory muscles of respiration.  CARDIOVASCULAR: S1, S2 normal. No murmurs, rubs, or gallops.  ABDOMEN: Soft, nontender, nondistended. Bowel sounds present. No organomegaly or mass.  EXTREMITIES: No pedal  edema, cyanosis, or clubbing.  NEUROLOGIC: Awake, alert and oriented x3, echolalia, motor grossly intact at her baseline, sensation intact. Gait not checked.  PSYCHIATRIC: The patient is alert and oriented x 3.  SKIN: No obvious rash, lesion, or ulcer.    LABORATORY PANEL:   CBC Recent Labs  Lab 03/12/19 0104  WBC 8.8  HGB 13.1  HCT 40.2  PLT 139*   ------------------------------------------------------------------------------------------------------------------  Chemistries  Recent Labs  Lab 03/11/19 1944 03/12/19 0104  NA 138 140  K 3.8 3.8  CL 102 108  CO2 26 24  GLUCOSE 131* 132*  BUN 16 16  CREATININE 0.77 0.60  CALCIUM 10.3 9.3  AST 26  --   ALT 16  --   ALKPHOS 45  --   BILITOT 1.2  --    ------------------------------------------------------------------------------------------------------------------  Cardiac Enzymes No results for input(s): TROPONINI in the last 168 hours. ------------------------------------------------------------------------------------------------------------------  RADIOLOGY:  Ct Head Wo Contrast  Result Date: 03/11/2019 CLINICAL DATA:  78 year old female with encephalopathy, abnormal speech since last night. EXAM: CT HEAD WITHOUT CONTRAST TECHNIQUE: Contiguous axial images were obtained from the base of the skull through the vertex without intravenous contrast. COMPARISON:  Head CT 09/13/2018 and earlier. FINDINGS: Brain: Small chronic dural calcifications. Chronic subcortical encephalomalacia in the anterior left superior frontal gyrus is stable. Patchy and scattered bilateral subcortical white matter hypodensity elsewhere appears stable. Stable cerebral volume. No midline shift, ventriculomegaly, mass effect, evidence of mass lesion, intracranial hemorrhage or evidence of cortically based acute infarction. Vascular: Calcified atherosclerosis at the skull base. No suspicious intracranial vascular hyperdensity. Skull: No acute osseous  abnormality identified. Sinuses/Orbits: Visualized paranasal sinuses and mastoids are stable and well pneumatized. Other: No acute  orbit or scalp soft tissue findings. IMPRESSION: 1. No acute intracranial abnormality. 2. Stable non contrast CT appearance of the brain since 2019 with chronic white matter disease. Electronically Signed   By: Genevie Ann M.D.   On: 03/11/2019 20:31   Mr Brain Wo Contrast  Result Date: 03/12/2019 CLINICAL DATA:  78 y/o  F; confusion and aphasia. EXAM: MRI HEAD WITHOUT CONTRAST TECHNIQUE: Multiplanar, multiecho pulse sequences of the brain and surrounding structures were obtained without intravenous contrast. COMPARISON:  03/11/2019 CT of the head.  10/28/2015 MRI of the head. FINDINGS: Brain: Predominantly cortical reduced diffusion within the left lateral temporal lobe extending into the posterior insula compatible with acute/early subacute infarction. No associated hemorrhage or mass effect. Early confluent nonspecific T2 FLAIR hyperintensities in subcortical and periventricular white matter are compatible with moderate chronic microvascular ischemic changes. Moderate volume loss of the brain. There are small chronic cortical infarctions within the left superior frontal lobe and the right lateral frontal lobe. No extra-axial collection, hydrocephalus, mass effect, or herniation. Vascular: Normal flow voids. Skull and upper cervical spine: Normal marrow signal. Sinuses/Orbits: Negative. Other: Bilateral intra-ocular lens replacement. IMPRESSION: 1. Reduced diffusion within the left lateral temporal lobe extending into left posterior insula compatible with acute/early subacute infarction. No associated hemorrhage or mass effect. 2. Stable moderate chronic microvascular ischemic changes and volume loss of the brain. Small chronic cortical infarcts within left superior frontal lobe and right lateral frontal lobe. These results were called by telephone at the time of interpretation on  03/12/2019 at 1:01 am to Dr. Owens Shark, who verbally acknowledged these results. Electronically Signed   By: Kristine Garbe M.D.   On: 03/12/2019 01:04   Mr Lumbar Spine Wo Contrast  Result Date: 03/12/2019 CLINICAL DATA:  78 y/o F; laminectomy on Monday. Now with sepsis and altered mental status. EXAM: MRI LUMBAR SPINE WITHOUT CONTRAST TECHNIQUE: Multiplanar, multisequence MR imaging of the lumbar spine was performed. No intravenous contrast was administered. COMPARISON:  06/07/2016 lumbar spine MRI. FINDINGS: Segmentation:  Standard. Alignment: Severe dextrocurvature with apex at L2. L5-S1 grade 1 anterolisthesis. Right L5 pars defect, likely chronic. Vertebrae: Postsurgical changes related to a right L3-4 hemilaminectomy. There is a small fluid collection within the subcutaneous fat at the level of the laminectomy measuring up to 3 cm as well as edema within the paraspinal muscles and the laminectomy bed. No epidural fluid collection is evident on this noncontrast examination. No findings of discitis osteomyelitis. There are left L2-3 and bilateral L5-S1 facet effusions with mild associated facet edema. No bony destructive changes are evident. Conus medullaris and cauda equina: Conus extends to the L1 level. There is mild crowding of the cauda equina at the L2-3 and L3-4 levels due to spinal canal stenosis. Paraspinal and other soft tissues: As above. Disc levels: T10-11: No significant disc displacement, foraminal stenosis, or spinal canal stenosis. T11-12: In plate marginal osteophytes eccentric to the left foraminal and extraforaminal zone as well as left-greater-than-right facet arthropathy result in moderate left neural foraminal stenosis. No significant spinal canal stenosis. T12-L1: Small left subarticular disc protrusion, endplate marginal osteophytes eccentric to the left foraminal and extraforaminal zone, and facet hypertrophy greater on the left result in mild-to-moderate stenosis of the left  lateral recess and the neural foramen. No significant spinal canal stenosis. L1-2: Endplate marginal osteophytes eccentric to the left extraforaminal zone combined with left-greater-than-right facet hypertrophy result in mild left-sided neural foraminal stenosis. No significant spinal canal stenosis. L2-3: Diffuse disc bulge with prominent bilateral facet and  ligamentum flavum hypertrophy eccentric to the left. Mild right and moderate left foraminal stenosis. Moderate spinal canal stenosis with crowding of the cauda equina. L3-4: Disc bulge, right hemilaminectomy, and severe bulky right-sided facet hypertrophy. Severe right and mild left neural foraminal stenosis. Mild residual spinal canal stenosis largely due to right-sided facet hypertrophy effacing the right lateral recess. L4-5: Mild disc bulge with severe right-sided facet hypertrophy as well as endplate marginal osteophytes in the right foraminal and extraforaminal zone. Severe right neural foraminal stenosis. No significant spinal canal stenosis. L5-S1: Grade 1 anterolisthesis with a small uncovered disc bulge mild left facet hypertrophy, and severe right facet hypertrophy. Severe right and mild left neural foraminal stenosis. Partial effacement of the lateral recesses. Mild spinal canal stenosis. IMPRESSION: 1. Expected postsurgical changes related to a right L3-4 hemilaminectomy. Small fluid collection within the subcutaneous fat at the level of surgery is likely a postoperative seroma. No findings of discitis osteomyelitis. No epidural fluid collection on this noncontrast examination. 2. Facet edema and effusions at the left L2-3 and bilateral L5-S1 levels favored to represent degenerative arthritis over septic arthritis. 3. Severe dextroscoliosis of the lumbar spine and grade 1 L5-S1 anterolisthesis. Advanced lumbar spondylosis with prominent upper lumbar left-sided and lower lumbar right-sided disc and facet degenerative changes. Spinal canal stenosis  is greatest at the L2-3 level where it is moderate with crowding of the cauda equina. Multilevel high-grade neural foraminal stenosis. Electronically Signed   By: Kristine Garbe M.D.   On: 03/12/2019 01:23   Dg Chest Port 1 View  Result Date: 03/11/2019 CLINICAL DATA:  78 year old female with altered mental status, fever. EXAM: PORTABLE CHEST 1 VIEW COMPARISON:  Chest radiographs 11/29/2017 and earlier. FINDINGS: AP seated view at 2005 hours. Lower lung volumes. Normal cardiac size and mediastinal contours. Visualized tracheal air column is within normal limits. No pneumothorax, pulmonary edema, pleural effusion or confluent pulmonary opacity. Negative visible bowel gas pattern. No acute osseous abnormality identified. Chronic scoliosis. IMPRESSION: Lower lung volumes.  No acute cardiopulmonary abnormality. Electronically Signed   By: Genevie Ann M.D.   On: 03/11/2019 20:32    EKG:   Orders placed or performed in visit on 02/15/17  . EKG 12-Lead    ASSESSMENT AND PLAN:   #Sepsis Patient met septic criteria at the time of admission with fever and tachycardia Blood cultures are negative, chest x-ray negative Urine culture pending Continue broad-spectrum IV antibiotics Lactic acid  1.1 procalcitonin less than 0.10  #Acute/subacute stroke left lateral temporal lobe with  previous history of strokes Currently on heparin drip Xarelto on hold in view of recent lumbar laminectomy Patient has passed bedside swallow evaluation MRI of the brain with acute/subacute left lateral temporal lobe infarct Carotid Dopplers and echocardiogram are pending Physical therapy is recommending home health PT 24-hour assistance and supervision LDL 67 Neurology consult placed, Dr. Doy Mince is aware  #Atrial fibrillation with RVR Patient home medications Xarelto on hold patient is started on heparin drip  #History of lumbar degenerative disc disease status post lumbar laminectomy on 03/10/2019 Consult  placed to Dr. Mack Guise, he will contact the spine surgeon  #Hypertension Will allow permissive hypertension in view of new stroke    All the records are reviewed and case discussed with Care Management/Social Workerr. Management plans discussed with the patient at bedside and daughter [name] over [phone] and they are in agreement.  CODE STATUS: DO NOT RESUSCITATE, daughter is the healthcare POA  TOTAL TIME TAKING CARE OF THIS PATIENT: 72mnutes.   POSSIBLE  D/C IN 1-2 DAYS, DEPENDING ON CLINICAL CONDITION.  Note: This dictation was prepared with Dragon dictation along with smaller phrase technology. Any transcriptional errors that result from this process are unintentional.   Nicholes Mango M.D on 03/12/2019 at 12:18 PM  Between 7am to 6pm - Pager - [phone] After 6pm go to [email or web address] - password [email or web address] [facility]  Office  [phone]  CC: Primary care physician; Birdie Sons, MD

## 2019-03-12 NOTE — Progress Notes (Signed)
Family Meeting Note  Advance Directive:yes  Today a meeting took place with the Patient at bed side    The following clinical team members were present during this meeting:MD  The following were discussed:Patient's diagnosis: Acute versus subacute stroke, sepsis, history of atrial fibrillation with Xarelto on hold ,previous history of strokes.  Just had a lumbar laminectomy on 03/10/2019.  Treatment plan of care discussed in detail with the patient.    Patient's progosis: Unable to determine and Goals for treatment: DNR  Daughter [name] is a healthcare POA  Additional follow-up to be provided: Hospitalist, neurology and orthopedics  Time spent during discussion:17 min  Nicholes Mango, MD

## 2019-03-12 NOTE — Progress Notes (Signed)
*  PRELIMINARY RESULTS* Echocardiogram 2D Echocardiogram has been performed.  [name] [name] 03/12/2019, 2:39 PM

## 2019-03-12 NOTE — Progress Notes (Signed)
Patient's MRI did show acute stroke.  Full stroke orders per order set have been placed.  Permissive hypertension tonight with blood pressure goal less than 220/120  [facility] 03/12/2019, 1:24 AM  Note:  This document was prepared using Dragon voice recognition software and may include unintentional dictation errors.

## 2019-03-12 NOTE — Progress Notes (Signed)
ANTICOAGULATION CONSULT NOTE - Initial Consult  Pharmacy Consult for heparin drip Indication: atrial fibrillation  Allergies  Allergen Reactions  . Aspirin Other (See Comments)    On xarelto    Patient Measurements: Height: 5\' 2"  (157.5 cm) Weight: 156 lb 4.8 oz (70.9 kg) IBW/kg (Calculated) : 50.1 Heparin Dosing Weight: 55.4 kg  Vital Signs: Temp: 98.5 F (36.9 C) (05/20 0600) Temp Source: Oral (05/20 0600) BP: 133/76 (05/20 0600) Pulse Rate: 114 (05/20 0600)  Labs: Recent Labs    03/11/19 1944 03/12/19 0104 03/12/19 0648  HGB 14.2 13.1  --   HCT 42.8 40.2  --   PLT 161 139*  --   APTT  --  37* 49*  LABPROT  --  18.7*  --   INR  --  1.6*  --   HEPARINUNFRC  --  2.22*  --   CREATININE 0.77 0.60  --     Estimated Creatinine Clearance: 55.2 mL/min (by C-G formula based on SCr of 0.6 mg/dL).   Medical History: Past Medical History:  Diagnosis Date  . Arthritis    osteo; in B knees;   . Bladder disorder   . Chicken pox   . Chronic a-fib   . DVT (deep venous thrombosis) ([place])    H/O  . Dyspnea   . Dysrhythmia    a fib  . Edema   . Hay fever   . Heart murmur   . Hypertension    controlled  . Irregular heart beat   . Seizure (Grandfather)    2 years ago knocked unconscious; hospitalized and diagnosed with seizures; no spells in last year;   . Stroke [facility])    1 month ago (possible stroke)  . Unspecified atrial fibrillation ([facility])     Medications:  Scheduled:  . diltiazem  240 mg Oral Daily  . diltiazem  5 mg Intravenous Once  . heparin  1,500 Units Intravenous Once  . metoprolol tartrate  25 mg Oral Daily    Assessment: Patient arrives to ED for aphasia w/ h/o DVT and afib anticoagulated w/ xarelto 20 mg daily PTA (last dose unknown) per note patient was suppose to "start again tonight." CT head negative. EKG pending. Patient is being started on heparin drip for anticoagulation of afib.  Goal of Therapy:  APTT 66 - 102 seconds Heparin level 0.3-0.7  units/ml Monitor platelets by anticoagulation protocol: Yes   Plan:  05/20 @ 0700 aPTT 49 seconds subtherapeutic. Will rebolus w/ heparin 1500 units IV x 1 and increase rate to 800 units/hr and will recheck aPTT @ 1400. CBC trending down will continue to monitor.  Tobie Lords, PharmD, BCPS Clinical Pharmacist 03/12/2019

## 2019-03-12 NOTE — Consult Note (Signed)
Referring Physician: Gouru    Chief Complaint: Confusion and difficulty with speech  HPI: [name] [name] is an 78 y.o. female with multiple medical problems who is unable to give full history due to aphasia.  History obtained from the chart.  Patient brought to the ED for evaluation for complaint of confusion and difficulty with speech.  Family stated that the patient was having difficulty getting all of her words out and that she seemed confused at times.  She recently underwent laminectomy for degenerative disc disease of her lumbar spine.  Patient states this was done 4 days ago.  In the ED she was also found to be febrile, tachycardic, though she has chronic A. fib she was found near to be in RVR, and to have an elevated white blood cell count.  Has been off of her anticoagulation due to recent surgery.  Initial NIHSS of 3.    Date last known well: Date: 03/10/2019 Time last known well: Unable to determine tPA Given: No: Recent surgery, outside time window  Past Medical History:  Diagnosis Date  . Arthritis    osteo; in B knees;   . Bladder disorder   . Chicken pox   . Chronic a-fib   . DVT (deep venous thrombosis) ([place])    H/O  . Dyspnea   . Dysrhythmia    a fib  . Edema   . Hay fever   . Heart murmur   . Hypertension    controlled  . Irregular heart beat   . Seizure ([place])    2 years ago knocked unconscious; hospitalized and diagnosed with seizures; no spells in last year;   . Stroke [facility])    1 month ago (possible stroke)  . Unspecified atrial fibrillation [facility])     Past Surgical History:  Procedure Laterality Date  . ablation  [place], [place]  . ABLATION     for irregular heart beat  . bilateral wrist surgery  2003  . BREAST BIOPSY Left    stereo  . BREAST SURGERY    . CATARACT EXTRACTION W/PHACO Right 06/01/2015   Procedure: CATARACT EXTRACTION PHACO AND INTRAOCULAR LENS PLACEMENT (IOC);  Surgeon: Birder Robson, MD;  Location: [facility] ORS;  Service:  Ophthalmology;  Laterality: Right;  [place]: 01:02.0 AP%: 23.1 CDE: 14.33 Fluid lot# [id number]   . CATARACT EXTRACTION W/PHACO Left 06/22/2015   Procedure: CATARACT EXTRACTION PHACO AND INTRAOCULAR LENS PLACEMENT (IOC);  Surgeon: Birder Robson, MD;  Location: [facility] ORS;  Service: Ophthalmology;  Laterality: Left;  [place]: 01:00.7 AP%: 23.1 CDE: 14.01 Lot # [id number]  . COLONOSCOPY    . DILATION AND CURETTAGE OF UTERUS    . JOINT REPLACEMENT    . KNEE ARTHROPLASTY Right 03/01/2016   Procedure: COMPUTER ASSISTED TOTAL KNEE ARTHROPLASTY;  Surgeon: Dereck Leep, MD;  Location: [facility] ORS;  Service: Orthopedics;  Laterality: Right;  . KNEE ARTHROPLASTY Left 04/23/2017   Procedure: COMPUTER ASSISTED TOTAL KNEE ARTHROPLASTY;  Surgeon: Dereck Leep, MD;  Location: [facility] ORS;  Service: Orthopedics;  Laterality: Left;  . TONSILLECTOMY    . TOTAL KNEE ARTHROPLASTY Right     Family History  Problem Relation Age of Onset  . Stroke Mother   . Congestive Heart Failure Mother   . Atrial fibrillation Mother   . Melanoma Mother   . Stroke Maternal Grandmother   . Arthritis Paternal Grandmother        RA  . Diabetes Paternal Grandfather    Social History:  reports  that she quit smoking about 46 years ago. She has never used smokeless tobacco. She reports that she does not drink alcohol or use drugs.  Allergies:  Allergies  Allergen Reactions  . Aspirin Other (See Comments)    On xarelto    Medications:  I have reviewed the patient's current medications. Prior to Admission:  Medications Prior to Admission  Medication Sig Dispense Refill Last Dose  . acetaminophen (TYLENOL) 650 MG CR tablet Take 650 mg by mouth every 8 (eight) hours as needed for pain.   03/11/2019 at 1500  . alendronate (FOSAMAX) 70 MG tablet Take 1 tablet (70 mg total) by mouth once a week. 12 tablet 4 Past Week at Unknown time  . B Complex-Biotin-FA (EQL B COMPLEX 100) TABS Take 0.4 mg tablet once daily by mouth   03/11/2019 at 0800   . Calcium-Magnesium-Vitamin D (CALCIUM 1200+D3 PO) Take 1,200 mg by mouth daily.   03/11/2019 at 0800  . cholecalciferol (VITAMIN D) 1000 units tablet Take 1,000 Units by mouth daily.   03/11/2019 at 0800  . Coenzyme Q10 (CO Q-10) 100 MG CAPS Take 100 mg by mouth daily.    03/11/2019 at 0800  . diltiazem (CARDIZEM CD) 240 MG 24 hr capsule TAKE 1 CAPSULE BY MOUTH EVERY DAY. 90 capsule 4 03/11/2019 at 0800  . gabapentin (NEURONTIN) 300 MG capsule Take 1 capsule (300 mg total) by mouth 2 (two) times daily. 180 capsule 4 03/11/2019 at 0800  . Lactobacillus (ACIDOPHILUS PO) Take 1 tablet by mouth at bedtime.    03/11/2019 at 0800  . lidocaine (ASPERCREME W/LIDOCAINE) 4 % cream Apply 1 application topically daily as needed. Apply to affected area for pain   prn at prn  . MEGARED OMEGA-3 KRILL OIL PO Take 300 mg by mouth daily.    03/11/2019 at 0800  . metoprolol tartrate (LOPRESSOR) 25 MG tablet TAKE 1 TABLET BY MOUTH TWICE A DAY. (Patient taking differently: Take 25 mg by mouth daily. ) 180 tablet 4 03/11/2019 at 0800  . oxybutynin (DITROPAN) 5 MG tablet TAKE 1 TABLET BY MOUTH 2 TIMES DAILY 180 tablet 3 03/11/2019 at 0800  . oxyCODONE-acetaminophen (PERCOCET) 7.5-325 MG tablet TAKE 1 TABLET BY MOUTH EVERY 4 HOURS AS NEEDED FOR SEVERE PAIN 30 tablet 0 03/11/2019 at 1000  . TURMERIC PO Take 800 mg by mouth 2 (two) times daily.   03/11/2019 at 0800  . XARELTO 20 MG TABS tablet TAKE 1 TABLET BY MOUTH DAILY WITH SUPPER 90 tablet 4 Past Week at Unknown time   Scheduled: . diltiazem  240 mg Oral Daily  . diltiazem  5 mg Intravenous Once  . metoprolol tartrate  25 mg Oral Daily    ROS: History obtained from the patient  General ROS: negative for - chills, fatigue, fever, night sweats, weight gain or weight loss Psychological ROS: negative for - behavioral disorder, hallucinations, memory difficulties, mood swings or suicidal ideation Ophthalmic ROS: negative for - blurry vision, double vision, eye pain or loss of  vision ENT ROS: negative for - epistaxis, nasal discharge, oral lesions, sore throat, tinnitus or vertigo Allergy and Immunology ROS: negative for - hives or itchy/watery eyes Hematological and Lymphatic ROS: negative for - bleeding problems, bruising or swollen lymph nodes Endocrine ROS: negative for - galactorrhea, hair pattern changes, polydipsia/polyuria or temperature intolerance Respiratory ROS: negative for - cough, hemoptysis, shortness of breath or wheezing Cardiovascular ROS: negative for - chest pain, dyspnea on exertion, edema or irregular heartbeat Gastrointestinal ROS: negative for -  abdominal pain, diarrhea, hematemesis, nausea/vomiting or stool incontinence Genito-Urinary ROS: negative for - dysuria, hematuria, incontinence or urinary frequency/urgency Musculoskeletal ROS: improved back pain Neurological ROS: as noted in HPI Dermatological ROS: negative for rash and skin lesion changes  Physical Examination: Blood pressure (!) 150/81, pulse (!) 140, temperature 98.8 F (37.1 C), temperature source Oral, resp. rate 18, height 5\' 2"  (1.575 m), weight 70.9 kg, SpO2 95 %.  HEENT-  Normocephalic, no lesions, without obvious abnormality.  Normal external eye and conjunctiva.  Normal TM's bilaterally.  Normal auditory canals and external ears. Normal external nose, mucus membranes and septum.  Normal pharynx. Cardiovascular- S1, S2 normal, pulses palpable throughout   Lungs- chest clear, no wheezing, rales, normal symmetric air entry Abdomen- soft, non-tender; bowel sounds normal; no masses,  no organomegaly Extremities- no edema Lymph-no adenopathy palpable Musculoskeletal-no joint tenderness, deformity or swelling Skin-warm and dry, no hyperpigmentation, vitiligo, or suspicious lesions  Neurological Examination   Mental Status: Alert, thought content appropriate.  Evidence of expressive aphasia but able to follow commands.   Cranial Nerves: II: Discs flat bilaterally; Visual  fields grossly normal, pupils equal, round, reactive to light and accommodation III,IV, VI: ptosis not present, extra-ocular motions intact bilaterally V,VII: smile symmetric, facial light touch sensation normal bilaterally VIII: hearing normal bilaterally IX,X: gag reflex present XI: bilateral shoulder shrug XII: midline tongue extension Motor: Right : Upper extremity   4-/5    Left:     Upper extremity   5/5  Lower extremity   5/5     Lower extremity   5/5 Tone and bulk:normal tone throughout; no atrophy noted Sensory: Pinprick and light touch intact throughout, bilaterally Deep Tendon Reflexes: Symmetric throughout Plantars: Right: mute   Left: mute Cerebellar: Normal finger-to-nose testing Gait: not tested due to safety concerns    Laboratory Studies:  Basic Metabolic Panel: Recent Labs  Lab 03/11/19 1944 03/12/19 0104  NA 138 140  K 3.8 3.8  CL 102 108  CO2 26 24  GLUCOSE 131* 132*  BUN 16 16  CREATININE 0.77 0.60  CALCIUM 10.3 9.3    Liver Function Tests: Recent Labs  Lab 03/11/19 1944  AST 26  ALT 16  ALKPHOS 45  BILITOT 1.2  PROT 6.9  ALBUMIN 4.3   No results for input(s): LIPASE, AMYLASE in the last 168 hours. No results for input(s): AMMONIA in the last 168 hours.  CBC: Recent Labs  Lab 03/11/19 1944 03/12/19 0104  WBC 12.2* 8.8  NEUTROABS 7.6  --   HGB 14.2 13.1  HCT 42.8 40.2  MCV 98.4 101.3*  PLT 161 139*    Cardiac Enzymes: No results for input(s): CKTOTAL, CKMB, CKMBINDEX, TROPONINI in the last 168 hours.  BNP: Invalid input(s): POCBNP  CBG: No results for input(s): GLUCAP in the last 168 hours.  Microbiology: Results for orders placed or performed during the hospital encounter of 03/11/19  Blood Culture (routine x 2)     Status: None (Preliminary result)   Collection Time: 03/11/19  7:44 PM  Result Value Ref Range Status   Specimen Description BLOOD LEFT ANTECUBITAL  Final   Special Requests   Final    BOTTLES DRAWN  AEROBIC AND ANAEROBIC Blood Culture adequate volume   Culture   Final    NO GROWTH < 12 HOURS Performed at [facility], [address] [place], [place] [address]    Report Status PENDING  Incomplete  SARS Coronavirus 2 (CEPHEID - Performed in [place] hospital lab), [facility]  Order     Status: None   Collection Time: 03/11/19  7:45 PM  Result Value Ref Range Status   SARS Coronavirus 2 NEGATIVE NEGATIVE Final    Comment: (NOTE) If result is NEGATIVE SARS-CoV-2 target nucleic acids are NOT DETECTED. The SARS-CoV-2 RNA is generally detectable in upper and lower  respiratory specimens during the acute phase of infection. The lowest  concentration of SARS-CoV-2 viral copies this assay can detect is 250  copies / mL. A negative result does not preclude SARS-CoV-2 infection  and should not be used as the sole basis for treatment or other  patient management decisions.  A negative result may occur with  improper specimen collection / handling, submission of specimen other  than nasopharyngeal swab, presence of viral mutation(s) within the  areas targeted by this assay, and inadequate number of viral copies  (<250 copies / mL). A negative result must be combined with clinical  observations, patient history, and epidemiological information. If result is POSITIVE SARS-CoV-2 target nucleic acids are DETECTED. The SARS-CoV-2 RNA is generally detectable in upper and lower  respiratory specimens dur ing the acute phase of infection.  Positive  results are indicative of active infection with SARS-CoV-2.  Clinical  correlation with patient history and other diagnostic information is  necessary to determine patient infection status.  Positive results do  not rule out bacterial infection or co-infection with other viruses. If result is PRESUMPTIVE POSTIVE SARS-CoV-2 nucleic acids MAY BE PRESENT.   A presumptive positive result was obtained on the submitted specimen  and confirmed on  repeat testing.  While 2019 novel coronavirus  (SARS-CoV-2) nucleic acids may be present in the submitted sample  additional confirmatory testing may be necessary for epidemiological  and / or clinical management purposes  to differentiate between  SARS-CoV-2 and other Sarbecovirus currently known to infect humans.  If clinically indicated additional testing with an alternate test  methodology [phone]) is advised. The SARS-CoV-2 RNA is generally  detectable in upper and lower respiratory sp ecimens during the acute  phase of infection. The expected result is Negative. Fact Sheet for Patients:  StrictlyIdeas.no Fact Sheet for Healthcare Providers: BankingDealers.co.za This test is not yet approved or cleared by the [place] FDA and has been authorized for detection and/or diagnosis of SARS-CoV-2 by FDA under an Emergency Use Authorization (EUA).  This EUA will remain in effect (meaning this test can be used) for the duration of the COVID-19 declaration under Section 564(b)(1) of the Act, 21 U.S.C. section 360bbb-3(b)(1), unless the authorization is terminated or revoked sooner. Performed at [facility], [address] [place], [place] [address]   Blood Culture (routine x 2)     Status: None (Preliminary result)   Collection Time: 03/11/19  7:49 PM  Result Value Ref Range Status   Specimen Description BLOOD RIGHT ANTECUBITAL  Final   Special Requests   Final    BOTTLES DRAWN AEROBIC AND ANAEROBIC Blood Culture adequate volume   Culture   Final    NO GROWTH < 12 HOURS Performed at [facility], [address] [place], [place] [address]    Report Status PENDING  Incomplete    Coagulation Studies: Recent Labs    03/12/19 0104  LABPROT 18.7*  INR 1.6*    Urinalysis:  Recent Labs  Lab 03/11/19 0500  COLORURINE YELLOW*  LABSPEC 1.003*  PHURINE 6.0  GLUCOSEU NEGATIVE  HGBUR NEGATIVE  BILIRUBINUR  NEGATIVE  KETONESUR NEGATIVE  PROTEINUR NEGATIVE  NITRITE NEGATIVE  LEUKOCYTESUR NEGATIVE    Lipid Panel:    Component Value Date/Time   CHOL 143 03/12/2019 0239   CHOL 178 09/12/2018 1124   CHOL 150 08/25/2013 0457   TRIG 13 03/12/2019 0239   TRIG 64 08/25/2013 0457   HDL 73 03/12/2019 0239   HDL 86 09/12/2018 1124   HDL 66 (H) 08/25/2013 0457   CHOLHDL 2.0 03/12/2019 0239   VLDL 3 03/12/2019 0239   VLDL 13 08/25/2013 0457   LDLCALC 67 03/12/2019 0239   LDLCALC 75 09/12/2018 1124   LDLCALC 71 08/25/2013 0457    HgbA1C:  Lab Results  Component Value Date   HGBA1C 5.0 03/12/2019    Urine Drug Screen:  No results found for: LABOPIA, COCAINSCRNUR, LABBENZ, AMPHETMU, THCU, LABBARB  Alcohol Level: No results for input(s): ETH in the last 168 hours.   Imaging: Ct Head Wo Contrast  Result Date: 03/11/2019 CLINICAL DATA:  78 year old female with encephalopathy, abnormal speech since last night. EXAM: CT HEAD WITHOUT CONTRAST TECHNIQUE: Contiguous axial images were obtained from the base of the skull through the vertex without intravenous contrast. COMPARISON:  Head CT 09/13/2018 and earlier. FINDINGS: Brain: Small chronic dural calcifications. Chronic subcortical encephalomalacia in the anterior left superior frontal gyrus is stable. Patchy and scattered bilateral subcortical white matter hypodensity elsewhere appears stable. Stable cerebral volume. No midline shift, ventriculomegaly, mass effect, evidence of mass lesion, intracranial hemorrhage or evidence of cortically based acute infarction. Vascular: Calcified atherosclerosis at the skull base. No suspicious intracranial vascular hyperdensity. Skull: No acute osseous abnormality identified. Sinuses/Orbits: Visualized paranasal sinuses and mastoids are stable and well pneumatized. Other: No acute orbit or scalp soft tissue findings. IMPRESSION: 1. No acute intracranial abnormality. 2. Stable non contrast CT appearance of the brain  since 2019 with chronic white matter disease. Electronically Signed   By: Genevie Ann M.D.   On: 03/11/2019 20:31   Mr Brain Wo Contrast  Result Date: 03/12/2019 CLINICAL DATA:  78 y/o  F; confusion and aphasia. EXAM: MRI HEAD WITHOUT CONTRAST TECHNIQUE: Multiplanar, multiecho pulse sequences of the brain and surrounding structures were obtained without intravenous contrast. COMPARISON:  03/11/2019 CT of the head.  10/28/2015 MRI of the head. FINDINGS: Brain: Predominantly cortical reduced diffusion within the left lateral temporal lobe extending into the posterior insula compatible with acute/early subacute infarction. No associated hemorrhage or mass effect. Early confluent nonspecific T2 FLAIR hyperintensities in subcortical and periventricular white matter are compatible with moderate chronic microvascular ischemic changes. Moderate volume loss of the brain. There are small chronic cortical infarctions within the left superior frontal lobe and the right lateral frontal lobe. No extra-axial collection, hydrocephalus, mass effect, or herniation. Vascular: Normal flow voids. Skull and upper cervical spine: Normal marrow signal. Sinuses/Orbits: Negative. Other: Bilateral intra-ocular lens replacement. IMPRESSION: 1. Reduced diffusion within the left lateral temporal lobe extending into left posterior insula compatible with acute/early subacute infarction. No associated hemorrhage or mass effect. 2. Stable moderate chronic microvascular ischemic changes and volume loss of the brain. Small chronic cortical infarcts within left superior frontal lobe and right lateral frontal lobe. These results were called by telephone at the time of interpretation on 03/12/2019 at 1:01 am to Dr. Owens Shark, who verbally acknowledged these results. Electronically Signed   By: Kristine Garbe M.D.   On: 03/12/2019 01:04   Mr Lumbar Spine Wo Contrast  Result Date: 03/12/2019 CLINICAL DATA:  78 y/o F; laminectomy on Monday. Now  with sepsis and altered mental status. EXAM: MRI LUMBAR SPINE WITHOUT  CONTRAST TECHNIQUE: Multiplanar, multisequence MR imaging of the lumbar spine was performed. No intravenous contrast was administered. COMPARISON:  06/07/2016 lumbar spine MRI. FINDINGS: Segmentation:  Standard. Alignment: Severe dextrocurvature with apex at L2. L5-S1 grade 1 anterolisthesis. Right L5 pars defect, likely chronic. Vertebrae: Postsurgical changes related to a right L3-4 hemilaminectomy. There is a small fluid collection within the subcutaneous fat at the level of the laminectomy measuring up to 3 cm as well as edema within the paraspinal muscles and the laminectomy bed. No epidural fluid collection is evident on this noncontrast examination. No findings of discitis osteomyelitis. There are left L2-3 and bilateral L5-S1 facet effusions with mild associated facet edema. No bony destructive changes are evident. Conus medullaris and cauda equina: Conus extends to the L1 level. There is mild crowding of the cauda equina at the L2-3 and L3-4 levels due to spinal canal stenosis. Paraspinal and other soft tissues: As above. Disc levels: T10-11: No significant disc displacement, foraminal stenosis, or spinal canal stenosis. T11-12: In plate marginal osteophytes eccentric to the left foraminal and extraforaminal zone as well as left-greater-than-right facet arthropathy result in moderate left neural foraminal stenosis. No significant spinal canal stenosis. T12-L1: Small left subarticular disc protrusion, endplate marginal osteophytes eccentric to the left foraminal and extraforaminal zone, and facet hypertrophy greater on the left result in mild-to-moderate stenosis of the left lateral recess and the neural foramen. No significant spinal canal stenosis. L1-2: Endplate marginal osteophytes eccentric to the left extraforaminal zone combined with left-greater-than-right facet hypertrophy result in mild left-sided neural foraminal stenosis. No  significant spinal canal stenosis. L2-3: Diffuse disc bulge with prominent bilateral facet and ligamentum flavum hypertrophy eccentric to the left. Mild right and moderate left foraminal stenosis. Moderate spinal canal stenosis with crowding of the cauda equina. L3-4: Disc bulge, right hemilaminectomy, and severe bulky right-sided facet hypertrophy. Severe right and mild left neural foraminal stenosis. Mild residual spinal canal stenosis largely due to right-sided facet hypertrophy effacing the right lateral recess. L4-5: Mild disc bulge with severe right-sided facet hypertrophy as well as endplate marginal osteophytes in the right foraminal and extraforaminal zone. Severe right neural foraminal stenosis. No significant spinal canal stenosis. L5-S1: Grade 1 anterolisthesis with a small uncovered disc bulge mild left facet hypertrophy, and severe right facet hypertrophy. Severe right and mild left neural foraminal stenosis. Partial effacement of the lateral recesses. Mild spinal canal stenosis. IMPRESSION: 1. Expected postsurgical changes related to a right L3-4 hemilaminectomy. Small fluid collection within the subcutaneous fat at the level of surgery is likely a postoperative seroma. No findings of discitis osteomyelitis. No epidural fluid collection on this noncontrast examination. 2. Facet edema and effusions at the left L2-3 and bilateral L5-S1 levels favored to represent degenerative arthritis over septic arthritis. 3. Severe dextroscoliosis of the lumbar spine and grade 1 L5-S1 anterolisthesis. Advanced lumbar spondylosis with prominent upper lumbar left-sided and lower lumbar right-sided disc and facet degenerative changes. Spinal canal stenosis is greatest at the L2-3 level where it is moderate with crowding of the cauda equina. Multilevel high-grade neural foraminal stenosis. Electronically Signed   By: Kristine Garbe M.D.   On: 03/12/2019 01:23   Dg Chest Port 1 View  Result Date:  03/11/2019 CLINICAL DATA:  78 year old female with altered mental status, fever. EXAM: PORTABLE CHEST 1 VIEW COMPARISON:  Chest radiographs 11/29/2017 and earlier. FINDINGS: AP seated view at 2005 hours. Lower lung volumes. Normal cardiac size and mediastinal contours. Visualized tracheal air column is within normal limits. No pneumothorax, pulmonary edema,  pleural effusion or confluent pulmonary opacity. Negative visible bowel gas pattern. No acute osseous abnormality identified. Chronic scoliosis. IMPRESSION: Lower lung volumes.  No acute cardiopulmonary abnormality. Electronically Signed   By: Genevie Ann M.D.   On: 03/11/2019 20:32    Assessment: 78 y.o. female presenting with aphasia and RUE weakness.  MRI of the brain performed and shows evidence of an acute left temporal lobe infarct.  Likely embolic in etiology.  Patient previously on anticoagulation but taken off for recent surgery.  Orthopedics has seen patient and cleared to return to anticoagulation.  A1c 5.0, LDL 67. Patient also with fever and elevated white blood cell count.  May be reactive response related to recent surgery.  There is no evidence of acute  infection on MR imaging of surgical area.    Stroke Risk Factors - atrial fibrillation and hypertension  Plan: 1. PT consult, OT consult, Speech consult 2. Echocardiogram pending 3. Carotid dopplers pending 4. Prophylactic therapy-Patient to return to anticaogulation 5. NPO until RN stroke swallow screen 6. Telemetry monitoring 7. Frequent neuro checks   Alexis Goodell, MD Neurology [phone] 03/12/2019, 2:55 PM

## 2019-03-12 NOTE — Evaluation (Addendum)
Physical Therapy Evaluation Patient Details Name: [name] [name] MRN: [id number] DOB: 1941/01/01 Today's Date: 03/12/2019   History of Present Illness  Pt is 78 yo female admitted for sepsis, AMS, recent back surgery 5/18 per chart (laminectomy), work up showed MRI + for acute infarct, Reduced diffusion within the left lateral temporal lobe extending into left posterior insula compatible with acute/early subacute infarction. PMH of afib, DVT, heart murmur, seizure, possible stroke    Clinical Impression  Pt alert, aphasic but aware of deficits. Able to intermittently form sentences, and appropriately say yes, no, shake head. Denies pain at rest. With pt permission, pt daughter called and spoke with pt/PT to provide PLOF information. Pt lives in an independent living facility, alone in her apartment, meals are provided 1x a day, previously independent withput AD. Per daughter staff can check on residents and there is a help button to press on the wall which is limited now due to Covid.  Pt was able to lift all extremities on command. Supine <> sit minA provided for pt comfort, but may not have been necessary. Sitting EOB pt demonstrated fair sitting balance. HR in 130s-140s. Pt expressed that she needed to use a commode, no BSC available. Sit <> Stand with RW with CGA, pt was able to take small side steps towards head of bed to allow repositioning. Mod I for rolling for bed pan use, pt fatigued at end of session. PT session limited by HR and the patient will need further assessment of mobility and safety prior to discharge. RN and MD notified of elevated HR. Recommendation tentatively short term rehab with supervision 24/7, will update as able.   Note addended after speaking with OT and case management spoke with family who updated PT. Pt exhibited fatigue with functional mobility, ADLs, and communication deficits, impairing pt safety. Recommendation is STR due to these limitations and level of  assistance needed, pending pt progress and further assessment.      Follow Up Recommendations  SNF;Supervision- 24hr    Equipment Recommendations    TBD   Recommendations for Other Services   OT consult, speech    Precautions / Restrictions Precautions Precautions: Fall Restrictions Weight Bearing Restrictions: No       03/12/19 1056  PT Visit Information  Last PT Received On 03/12/19  Assistance Needed +1  History of Present Illness Pt is 78 yo female admitted for sepsis, AMS, recent back surgery 5/18 per chart (laminectomy), work up showed MRI + for acute infarct, Reduced diffusion within the left lateral temporal lobe extending into left posterior insula compatible with acute/early subacute infarction. PMH of afib, DVT, heart murmur, seizure, possible stroke  Precautions  Precautions Fall  Restrictions  Weight Bearing Restrictions No  Home Living  Family/patient expects to be discharged to: Assisted living  [facility] - 4 wheels;Shower seat - built in (3 wheeled walker)  Prior Function  Level of Independence Independent with assistive device(s)  Comments Facility provides at least 1 meal a day, previously independent with AD, pt denies falls in the last 6 months. PLOF confirmed with family due to pt aphasic state. No assistanced needed for bathing/dressing, family lives 8mins away  Communication  Communication Expressive difficulties  Written Expression  Dominant Hand Right     03/12/19 1059  Pain Assessment  Pain Assessment No/denies pain  Cognition  Arousal/Alertness Awake/alert  Behavior During Therapy WFL for tasks assessed/performed  Overall Cognitive Status Difficult to assess  General Comments Pt oriented to self, aware of  situation but difficulty answering questions due to aphasia  Difficult to assess due to Impaired communication  Upper Extremity Assessment  Upper Extremity Assessment Defer to OT evaluation  Lower Extremity Assessment  Lower  Extremity Assessment Generalized weakness (Unable to attempt MMT due to elevated HR, will re-attempt later)     03/12/19 1100  Bed Mobility  Overal bed mobility Needs Assistance  Bed Mobility Supine to Sit;Sit to Supine;Rolling  Rolling Modified independent (Device/Increase time)  Supine to sit Min assist  Sit to supine Min assist  General bed mobility comments pt reaches for PT hand for complete elevation, minA to return to bed for LE management.   Transfers  Overall transfer level Needs assistance  Equipment used Rolling walker (2 wheeled)  Transfers Sit to/from Stand  Sit to Stand Min guard  Ambulation/Gait  Ambulation/Gait assistance Min guard  Gait Distance (Feet) 1 Feet  Assistive device Rolling walker (2 wheeled)  General Gait Details Pt able to take small side steps towards head of bed to the R. Further ambulation held due to elevated HR. 130s-140s sitting EOB.     03/12/19 1101  Balance  Overall balance assessment Needs assistance  Sitting-balance support Feet supported  Sitting balance-Leahy Scale Fair  Standing balance comment needs further assessment  PT - End of Session  Equipment Utilized During Treatment Gait belt  Activity Tolerance Treatment limited secondary to medical complications (Comment);Other (comment) (Limited by elevated HR)  Patient left in bed;with call bell/phone within reach;with bed alarm set  Nurse Communication Mobility status;Other (comment) (HR, and family requesting information)  PT Assessment  PT Recommendation/Assessment Patient needs continued PT services  PT Visit Diagnosis Muscle weakness (generalized) (M62.81);Difficulty in walking, not elsewhere classified (R26.2);Other symptoms and signs involving the nervous system (R29.898);Other abnormalities of gait and mobility (R26.89)  PT Problem List Decreased strength;Decreased mobility;Decreased safety awareness;Decreased knowledge of precautions;Decreased balance;Decreased activity  tolerance  Barriers to Discharge Decreased caregiver support;Inaccessible home environment  PT Plan  PT Frequency (ACUTE ONLY) 7X/week  PT Treatment/Interventions (ACUTE ONLY) DME instruction;Functional mobility training;Balance training;Patient/family education;Gait training;Therapeutic activities;Neuromuscular re-education;Stair training;Therapeutic exercise   PT Charges: 908- 952 PT Time calculated: 44 PT Evaluation: low PT Treatment: Therapeutic Activity: 8-22 mins  Lieutenant Diego PT, DPT 4:11 PM,03/12/19 [phone]

## 2019-03-13 ENCOUNTER — Inpatient Hospital Stay: Payer: PPO

## 2019-03-13 LAB — CBC
HCT: 37.9 % (ref 36.0–46.0)
Hemoglobin: 12.7 g/dL (ref 12.0–15.0)
MCH: 33.2 pg (ref 26.0–34.0)
MCHC: 33.5 g/dL (ref 30.0–36.0)
MCV: 99 fL (ref 80.0–100.0)
Platelets: 138 10*3/uL — ABNORMAL LOW (ref 150–400)
RBC: 3.83 MIL/uL — ABNORMAL LOW (ref 3.87–5.11)
RDW: 12.8 % (ref 11.5–15.5)
WBC: 8.3 10*3/uL (ref 4.0–10.5)
nRBC: 0 % (ref 0.0–0.2)

## 2019-03-13 LAB — LACTIC ACID, PLASMA
Lactic Acid, Venous: 2.3 mmol/L (ref 0.5–1.9)
Lactic Acid, Venous: 0.7 mmol/L (ref 0.5–1.9)

## 2019-03-13 LAB — RESPIRATORY PANEL BY PCR

## 2019-03-13 LAB — URINE CULTURE: Culture: NO GROWTH

## 2019-03-13 LAB — HEPARIN LEVEL (UNFRACTIONATED): Heparin Unfractionated: 0.43 IU/mL (ref 0.30–0.70)

## 2019-03-13 LAB — APTT: aPTT: 65 seconds — ABNORMAL HIGH (ref 24–36)

## 2019-03-13 LAB — ECHOCARDIOGRAM COMPLETE
Height: 62 in
Weight: 2500.8 oz

## 2019-03-13 MED ORDER — SODIUM CHLORIDE 0.9 % IV SOLN
INTRAVENOUS | Status: AC
  Administered 2019-03-13: 19:00:00 via INTRAVENOUS

## 2019-03-13 MED ORDER — HEPARIN BOLUS VIA INFUSION
1000.0000 [IU] | Freq: Once | INTRAVENOUS | Status: AC
  Administered 2019-03-13: 1000 [IU] via INTRAVENOUS
  Filled 2019-03-13: qty 1000

## 2019-03-13 MED ORDER — SODIUM CHLORIDE 0.9% FLUSH
3.0000 mL | Freq: Two times a day (BID) | INTRAVENOUS | Status: DC
  Administered 2019-03-13 – 2019-03-14 (×3): 3 mL via INTRAVENOUS

## 2019-03-13 MED ORDER — SODIUM CHLORIDE 0.9% FLUSH
3.0000 mL | Freq: Two times a day (BID) | INTRAVENOUS | Status: DC
  Administered 2019-03-13 – 2019-03-18 (×10): 3 mL via INTRAVENOUS

## 2019-03-13 MED ORDER — RIVAROXABAN 20 MG PO TABS
20.0000 mg | ORAL_TABLET | Freq: Every day | ORAL | Status: DC
  Administered 2019-03-13 – 2019-03-17 (×5): 20 mg via ORAL
  Filled 2019-03-13 (×5): qty 1

## 2019-03-13 MED ORDER — IOHEXOL 350 MG/ML SOLN
75.0000 mL | Freq: Once | INTRAVENOUS | Status: AC | PRN
  Administered 2019-03-13: 11:00:00 75 mL via INTRAVENOUS

## 2019-03-13 MED ORDER — SODIUM CHLORIDE 0.9% FLUSH: 3.0000 mL | INTRAVENOUS | Status: DC | PRN

## 2019-03-13 NOTE — Progress Notes (Signed)
Rehab Admissions Coordinator Note:  Patient was screened by Michel Santee, PT, DPT for appropriateness for an Inpatient Acute Rehab Consult.  At this time, we are recommending Inpatient Rehab consult. I will contact the MD for an order.   Shann Medal, PT, DPT Admissions Coordinator [phone] 03/13/19  3:24 PM

## 2019-03-13 NOTE — Progress Notes (Signed)
ANTICOAGULATION CONSULT NOTE - Initial Consult  Pharmacy Consult for heparin drip Indication: atrial fibrillation  Allergies  Allergen Reactions  . Aspirin Other (See Comments)    On xarelto    Patient Measurements: Height: 5\' 2"  (157.5 cm) Weight: 156 lb 4.8 oz (70.9 kg) IBW/kg (Calculated) : 50.1 Heparin Dosing Weight: 55.4 kg  Vital Signs: Temp: 99.3 F (37.4 C) (05/20 1939) Temp Source: Oral (05/20 1939) BP: 123/71 (05/20 1939) Pulse Rate: 89 (05/20 1939)  Labs: Recent Labs    03/11/19 1944  03/12/19 0104 03/12/19 0648 03/12/19 1353 03/12/19 2259  HGB 14.2  --  13.1  --   --   --   HCT 42.8  --  40.2  --   --   --   PLT 161  --  139*  --   --   --   APTT  --    < > 37* 49* 59* 58*  LABPROT  --   --  18.7*  --   --   --   INR  --   --  1.6*  --   --   --   HEPARINUNFRC  --   --  2.22*  --   --   --   CREATININE 0.77  --  0.60  --   --   --    < > = values in this interval not displayed.    Estimated Creatinine Clearance: 55.2 mL/min (by C-G formula based on SCr of 0.6 mg/dL).   Medical History: Past Medical History:  Diagnosis Date  . Arthritis    osteo; in B knees;   . Bladder disorder   . Chicken pox   . Chronic a-fib   . DVT (deep venous thrombosis) ([facility] [place])    H/O  . Dyspnea   . Dysrhythmia    a fib  . Edema   . Hay fever   . Heart murmur   . Hypertension    controlled  . Irregular heart beat   . Seizure ([facility])    2 years ago knocked unconscious; hospitalized and diagnosed with seizures; no spells in last year;   . Stroke [facility])    1 month ago (possible stroke)  . Unspecified atrial fibrillation ([facility])     Medications:  Scheduled:  . diltiazem  240 mg Oral Daily  . diltiazem  5 mg Intravenous Once  . heparin  1,000 Units Intravenous Once  . metoprolol tartrate  25 mg Oral Daily    Assessment: Patient arrives to ED for aphasia w/ h/o DVT and afib anticoagulated w/ xarelto 20 mg daily PTA (last dose unknown) per note patient was suppose  to "start again tonight." CT head negative. EKG pending. Patient is being started on heparin drip for anticoagulation of afib. Baseline heparin level was elevated at 2.22.  Started on 650 units/hr  5/20 @0648  aPTT 49 rebolus w/ heparin 1500 units IV x 1 and increase rate to 800 units/hr 5/20 @1353  aPTT 59 rebolus w/ heparin 1000 units x1 and increase rate to 900 units/hr   Goal of Therapy:  APTT 66 - 102 seconds Heparin level 0.3-0.7 units/ml when heparin level and aPTT correlate.  Monitor platelets by anticoagulation protocol: Yes   Plan:  05/20 @ 2259 aPTT 58 seconds. Will rebolus w/ heparin 1000 units IV x 1 and increase rate to 1000 units/hr and will recheck aPTT @ 0700, HL check w/ am labs. Will continue to monitor CBC and adjust per aPTT.  Tobie Lords, PharmD,  BCPS Clinical Pharmacist 03/13/2019

## 2019-03-13 NOTE — Progress Notes (Signed)
Subjective: Patient remains stable.  No new neurological complaints.   Objective: Current vital signs: BP 128/73 (BP Location: Left Arm)   Pulse 93   Temp 99 F (37.2 C) (Oral)   Resp 16   Ht 5\' 2"  (1.575 m)   Wt 70.9 kg   SpO2 95%   BMI 28.59 kg/m  Vital signs in last 24 hours: Temp:  [98.3 F (36.8 C)-99.3 F (37.4 C)] 99 F (37.2 C) (05/21 0727) Pulse Rate:  [45-93] 93 (05/21 0727) Resp:  [16] 16 (05/20 1632) BP: (123-140)/(62-77) 128/73 (05/21 0727) SpO2:  [95 %-100 %] 95 % (05/21 0727) Weight:  [70.9 kg] 70.9 kg (05/21 0320)  Intake/Output from previous day: 05/20 0701 - 05/21 0700 In: -  Out: 2100 [Urine:2100] Intake/Output this shift: No intake/output data recorded. Nutritional status:  Diet Order            Diet Heart Room service appropriate? Yes with Assist; Fluid consistency: Thin  Diet effective now              Neurologic Exam: Mental Status: Alert, thought content appropriate.  Evidence of expressive aphasia but able to follow some commands.   Cranial Nerves: II: Discs flat bilaterally; Visual fields grossly normal, pupils equal, round, reactive to light and accommodation III,IV, VI: ptosis not present, extra-ocular motions intact bilaterally V,VII: smile symmetric, facial light touch sensation normal bilaterally VIII: hearing normal bilaterally IX,X: gag reflex present XI: bilateral shoulder shrug XII: midline tongue extension Motor: Right :  Upper extremity   4-/5                                     Left:     Upper extremity   5/5             Lower extremity   5/5                                                  Lower extremity   5/5 Tone and bulk:normal tone throughout; no atrophy noted Sensory: Pinprick and light touch intact throughout, bilaterally   Lab Results: Basic Metabolic Panel: Recent Labs  Lab 03/11/19 1944 03/12/19 0104  NA 138 140  K 3.8 3.8  CL 102 108  CO2 26 24  GLUCOSE 131* 132*  BUN 16 16  CREATININE 0.77 0.60   CALCIUM 10.3 9.3    Liver Function Tests: Recent Labs  Lab 03/11/19 1944  AST 26  ALT 16  ALKPHOS 45  BILITOT 1.2  PROT 6.9  ALBUMIN 4.3   No results for input(s): LIPASE, AMYLASE in the last 168 hours. No results for input(s): AMMONIA in the last 168 hours.  CBC: Recent Labs  Lab 03/11/19 1944 03/12/19 0104 03/13/19 0507  WBC 12.2* 8.8 8.3  NEUTROABS 7.6  --   --   HGB 14.2 13.1 12.7  HCT 42.8 40.2 37.9  MCV 98.4 101.3* 99.0  PLT 161 139* 138*    Cardiac Enzymes: No results for input(s): CKTOTAL, CKMB, CKMBINDEX, TROPONINI in the last 168 hours.  Lipid Panel: Recent Labs  Lab 03/12/19 0239  CHOL 143  TRIG 13  HDL 73  CHOLHDL 2.0  VLDL 3  LDLCALC 67    CBG: No results for input(s): GLUCAP in the  last 168 hours.  Microbiology: Results for orders placed or performed during the hospital encounter of 03/11/19  Urine culture     Status: None   Collection Time: 03/11/19  5:00 AM  Result Value Ref Range Status   Specimen Description   Final    URINE, RANDOM Performed at [facility], [address], [place], [place] [address]    Special Requests   Final    NONE Performed at [facility], [address], [place], [place] [address]    Culture   Final    NO GROWTH Performed at [facility], [place] [address] [place], [place] [address]    Report Status 03/13/2019 FINAL  Final  Blood Culture (routine x 2)     Status: None (Preliminary result)   Collection Time: 03/11/19  7:44 PM  Result Value Ref Range Status   Specimen Description BLOOD LEFT ANTECUBITAL  Final   Special Requests   Final    BOTTLES DRAWN AEROBIC AND ANAEROBIC Blood Culture adequate volume   Culture   Final    NO GROWTH 2 DAYS Performed at [facility], [address] [place], [place] [address]    Report Status PENDING  Incomplete  SARS Coronavirus 2 (CEPHEID - Performed in [place] hospital lab), Hosp Order     Status: None   Collection Time:  03/11/19  7:45 PM  Result Value Ref Range Status   SARS Coronavirus 2 NEGATIVE NEGATIVE Final    Comment: (NOTE) If result is NEGATIVE SARS-CoV-2 target nucleic acids are NOT DETECTED. The SARS-CoV-2 RNA is generally detectable in upper and lower  respiratory specimens during the acute phase of infection. The lowest  concentration of SARS-CoV-2 viral copies this assay can detect is 250  copies / mL. A negative result does not preclude SARS-CoV-2 infection  and should not be used as the sole basis for treatment or other  patient management decisions.  A negative result may occur with  improper specimen collection / handling, submission of specimen other  than nasopharyngeal swab, presence of viral mutation(s) within the  areas targeted by this assay, and inadequate number of viral copies  (<250 copies / mL). A negative result must be combined with clinical  observations, patient history, and epidemiological information. If result is POSITIVE SARS-CoV-2 target nucleic acids are DETECTED. The SARS-CoV-2 RNA is generally detectable in upper and lower  respiratory specimens dur ing the acute phase of infection.  Positive  results are indicative of active infection with SARS-CoV-2.  Clinical  correlation with patient history and other diagnostic information is  necessary to determine patient infection status.  Positive results do  not rule out bacterial infection or co-infection with other viruses. If result is PRESUMPTIVE POSTIVE SARS-CoV-2 nucleic acids MAY BE PRESENT.   A presumptive positive result was obtained on the submitted specimen  and confirmed on repeat testing.  While 2019 novel coronavirus  (SARS-CoV-2) nucleic acids may be present in the submitted sample  additional confirmatory testing may be necessary for epidemiological  and / or clinical management purposes  to differentiate between  SARS-CoV-2 and other Sarbecovirus currently known to infect humans.  If clinically  indicated additional testing with an alternate test  methodology [phone]) is advised. The SARS-CoV-2 RNA is generally  detectable in upper and lower respiratory sp ecimens during the acute  phase of infection. The expected result is Negative. Fact Sheet for Patients:  StrictlyIdeas.no Fact Sheet for Healthcare Providers: BankingDealers.co.za This test is not  yet approved or cleared by the [place] and has been authorized for detection and/or diagnosis of SARS-CoV-2 by FDA under an Emergency Use Authorization (EUA).  This EUA will remain in effect (meaning this test can be used) for the duration of the COVID-19 declaration under Section 564(b)(1) of the Act, 21 U.S.C. section 360bbb-3(b)(1), unless the authorization is terminated or revoked sooner. Performed at [facility], [address] [place], [place] [address]   Blood Culture (routine x 2)     Status: None (Preliminary result)   Collection Time: 03/11/19  7:49 PM  Result Value Ref Range Status   Specimen Description BLOOD RIGHT ANTECUBITAL  Final   Special Requests   Final    BOTTLES DRAWN AEROBIC AND ANAEROBIC Blood Culture adequate volume   Culture   Final    NO GROWTH 2 DAYS Performed at [facility], [address] [place], [place] [address]    Report Status PENDING  Incomplete    Coagulation Studies: Recent Labs    03/12/19 0104  LABPROT 18.7*  INR 1.6*    Imaging: Ct Head Wo Contrast  Result Date: 03/11/2019 CLINICAL DATA:  78 year old female with encephalopathy, abnormal speech since last night. EXAM: CT HEAD WITHOUT CONTRAST TECHNIQUE: Contiguous axial images were obtained from the base of the skull through the vertex without intravenous contrast. COMPARISON:  Head CT 09/13/2018 and earlier. FINDINGS: Brain: Small chronic dural calcifications. Chronic subcortical encephalomalacia in the anterior left superior frontal gyrus is stable.  Patchy and scattered bilateral subcortical white matter hypodensity elsewhere appears stable. Stable cerebral volume. No midline shift, ventriculomegaly, mass effect, evidence of mass lesion, intracranial hemorrhage or evidence of cortically based acute infarction. Vascular: Calcified atherosclerosis at the skull base. No suspicious intracranial vascular hyperdensity. Skull: No acute osseous abnormality identified. Sinuses/Orbits: Visualized paranasal sinuses and mastoids are stable and well pneumatized. Other: No acute orbit or scalp soft tissue findings. IMPRESSION: 1. No acute intracranial abnormality. 2. Stable non contrast CT appearance of the brain since 2019 with chronic white matter disease. Electronically Signed   By: Genevie Ann M.D.   On: 03/11/2019 20:31   Ct Angio Neck W Or Wo Contrast  Result Date: 03/13/2019 CLINICAL DATA:  History of stroke.  Abnormal carotid Doppler EXAM: CT ANGIOGRAPHY NECK TECHNIQUE: Multidetector CT imaging of the neck was performed using the standard protocol during bolus administration of intravenous contrast. Multiplanar CT image reconstructions and MIPs were obtained to evaluate the vascular anatomy. Carotid stenosis measurements (when applicable) are obtained utilizing NASCET criteria, using the distal internal carotid diameter as the denominator. CONTRAST:  46mL OMNIPAQUE IOHEXOL 350 MG/ML SOLN COMPARISON:  Carotid Doppler 03/12/2019 FINDINGS: Aortic arch: Aberrant right subclavian artery passing posterior to the esophagus. Negative for aortic aneurysm or dissection. Proximal great vessels widely patent. Right carotid system: Right carotid bifurcation widely patent with minimal atherosclerotic calcification Left carotid system: Left carotid bifurcation widely patent with minimal atherosclerotic calcification Vertebral arteries: Both vertebral arteries widely patent without significant stenosis. Mild atherosclerotic calcification at the level of the skull base. Skeleton:  Cervical spondylosis.  No acute skeletal abnormality. Other neck: Negative Upper chest: Lung apices clear bilaterally. IMPRESSION: No significant carotid or vertebral stenosis in the neck. Minimal atherosclerotic disease. Aberrant right subclavian artery. Electronically Signed   By: Franchot Gallo M.D.   On: 03/13/2019 10:50   Mr Brain Wo Contrast  Result Date: 03/12/2019 CLINICAL DATA:  78 y/o  F; confusion and aphasia. EXAM: MRI HEAD WITHOUT CONTRAST TECHNIQUE: Multiplanar, multiecho  pulse sequences of the brain and surrounding structures were obtained without intravenous contrast. COMPARISON:  03/11/2019 CT of the head.  10/28/2015 MRI of the head. FINDINGS: Brain: Predominantly cortical reduced diffusion within the left lateral temporal lobe extending into the posterior insula compatible with acute/early subacute infarction. No associated hemorrhage or mass effect. Early confluent nonspecific T2 FLAIR hyperintensities in subcortical and periventricular white matter are compatible with moderate chronic microvascular ischemic changes. Moderate volume loss of the brain. There are small chronic cortical infarctions within the left superior frontal lobe and the right lateral frontal lobe. No extra-axial collection, hydrocephalus, mass effect, or herniation. Vascular: Normal flow voids. Skull and upper cervical spine: Normal marrow signal. Sinuses/Orbits: Negative. Other: Bilateral intra-ocular lens replacement. IMPRESSION: 1. Reduced diffusion within the left lateral temporal lobe extending into left posterior insula compatible with acute/early subacute infarction. No associated hemorrhage or mass effect. 2. Stable moderate chronic microvascular ischemic changes and volume loss of the brain. Small chronic cortical infarcts within left superior frontal lobe and right lateral frontal lobe. These results were called by telephone at the time of interpretation on 03/12/2019 at 1:01 am to Dr. Owens Shark, who verbally  acknowledged these results. Electronically Signed   By: Kristine Garbe M.D.   On: 03/12/2019 01:04   Mr Lumbar Spine Wo Contrast  Result Date: 03/12/2019 CLINICAL DATA:  78 y/o F; laminectomy on Monday. Now with sepsis and altered mental status. EXAM: MRI LUMBAR SPINE WITHOUT CONTRAST TECHNIQUE: Multiplanar, multisequence MR imaging of the lumbar spine was performed. No intravenous contrast was administered. COMPARISON:  06/07/2016 lumbar spine MRI. FINDINGS: Segmentation:  Standard. Alignment: Severe dextrocurvature with apex at L2. L5-S1 grade 1 anterolisthesis. Right L5 pars defect, likely chronic. Vertebrae: Postsurgical changes related to a right L3-4 hemilaminectomy. There is a small fluid collection within the subcutaneous fat at the level of the laminectomy measuring up to 3 cm as well as edema within the paraspinal muscles and the laminectomy bed. No epidural fluid collection is evident on this noncontrast examination. No findings of discitis osteomyelitis. There are left L2-3 and bilateral L5-S1 facet effusions with mild associated facet edema. No bony destructive changes are evident. Conus medullaris and cauda equina: Conus extends to the L1 level. There is mild crowding of the cauda equina at the L2-3 and L3-4 levels due to spinal canal stenosis. Paraspinal and other soft tissues: As above. Disc levels: T10-11: No significant disc displacement, foraminal stenosis, or spinal canal stenosis. T11-12: In plate marginal osteophytes eccentric to the left foraminal and extraforaminal zone as well as left-greater-than-right facet arthropathy result in moderate left neural foraminal stenosis. No significant spinal canal stenosis. T12-L1: Small left subarticular disc protrusion, endplate marginal osteophytes eccentric to the left foraminal and extraforaminal zone, and facet hypertrophy greater on the left result in mild-to-moderate stenosis of the left lateral recess and the neural foramen. No  significant spinal canal stenosis. L1-2: Endplate marginal osteophytes eccentric to the left extraforaminal zone combined with left-greater-than-right facet hypertrophy result in mild left-sided neural foraminal stenosis. No significant spinal canal stenosis. L2-3: Diffuse disc bulge with prominent bilateral facet and ligamentum flavum hypertrophy eccentric to the left. Mild right and moderate left foraminal stenosis. Moderate spinal canal stenosis with crowding of the cauda equina. L3-4: Disc bulge, right hemilaminectomy, and severe bulky right-sided facet hypertrophy. Severe right and mild left neural foraminal stenosis. Mild residual spinal canal stenosis largely due to right-sided facet hypertrophy effacing the right lateral recess. L4-5: Mild disc bulge with severe right-sided facet hypertrophy as well  as endplate marginal osteophytes in the right foraminal and extraforaminal zone. Severe right neural foraminal stenosis. No significant spinal canal stenosis. L5-S1: Grade 1 anterolisthesis with a small uncovered disc bulge mild left facet hypertrophy, and severe right facet hypertrophy. Severe right and mild left neural foraminal stenosis. Partial effacement of the lateral recesses. Mild spinal canal stenosis. IMPRESSION: 1. Expected postsurgical changes related to a right L3-4 hemilaminectomy. Small fluid collection within the subcutaneous fat at the level of surgery is likely a postoperative seroma. No findings of discitis osteomyelitis. No epidural fluid collection on this noncontrast examination. 2. Facet edema and effusions at the left L2-3 and bilateral L5-S1 levels favored to represent degenerative arthritis over septic arthritis. 3. Severe dextroscoliosis of the lumbar spine and grade 1 L5-S1 anterolisthesis. Advanced lumbar spondylosis with prominent upper lumbar left-sided and lower lumbar right-sided disc and facet degenerative changes. Spinal canal stenosis is greatest at the L2-3 level where it is  moderate with crowding of the cauda equina. Multilevel high-grade neural foraminal stenosis. Electronically Signed   By: Kristine Garbe M.D.   On: 03/12/2019 01:23   US Carotid Bilateral (at [facility] Only)  Result Date: 03/12/2019 CLINICAL DATA:  Stroke.  History of hypertension. EXAM: BILATERAL CAROTID DUPLEX ULTRASOUND TECHNIQUE: Pearline Cables scale imaging, color Doppler and duplex ultrasound were performed of bilateral carotid and vertebral arteries in the neck. COMPARISON:  None. FINDINGS: Criteria: Quantification of carotid stenosis is based on velocity parameters that correlate the residual internal carotid diameter with NASCET-based stenosis levels, using the diameter of the distal internal carotid lumen as the denominator for stenosis measurement. The following velocity measurements were obtained: RIGHT ICA: 138/15 cm/sec CCA: 16/10 cm/sec SYSTOLIC ICA/CCA RATIO:  1.6 ECA: 92 cm/sec LEFT ICA: 111/16 cm/sec CCA: 960/4 cm/sec SYSTOLIC ICA/CCA RATIO:  1.1 ECA: 103 cm/sec RIGHT CAROTID ARTERY: There is a minimal to moderate amount of eccentric echogenic plaque within the right carotid bulb extending to involve the origin and proximal aspects of the right internal carotid artery (images 15 and 22), which results in mildly elevated peak systolic velocities within the mid aspect of the right internal carotid artery. Greatest acquired peak systolic velocity within mid right ICA measures 138 centimeters/second (image 27). RIGHT VERTEBRAL ARTERY:  Antegrade Flow LEFT CAROTID ARTERY: There is a minimal amount of eccentric echogenic plaque involving the origin and proximal aspects of the left internal carotid artery (image 54), not resulting in elevated peak systolic velocities within the interrogated course of the left internal carotid artery to suggest a hemodynamically significant stenosis. LEFT VERTEBRAL ARTERY:  Antegrade flow IMPRESSION: 1. Minimal to moderate amount of right-sided atherosclerotic plaque  results in mildly elevated peak systolic velocities with the right internal carotid artery compatible with the lower end of the 50-69% luminal narrowing range. Further evaluation with CTA could performed as clinically indicated. 2. Minimal amount of left-sided atherosclerotic plaque, not resulting in a hemodynamically significant stenosis. Electronically Signed   By: Sandi Mariscal M.D.   On: 03/12/2019 16:06   Dg Chest Port 1 View  Result Date: 03/11/2019 CLINICAL DATA:  78 year old female with altered mental status, fever. EXAM: PORTABLE CHEST 1 VIEW COMPARISON:  Chest radiographs 11/29/2017 and earlier. FINDINGS: AP seated view at 2005 hours. Lower lung volumes. Normal cardiac size and mediastinal contours. Visualized tracheal air column is within normal limits. No pneumothorax, pulmonary edema, pleural effusion or confluent pulmonary opacity. Negative visible bowel gas pattern. No acute osseous abnormality identified. Chronic scoliosis. IMPRESSION: Lower lung volumes.  No acute cardiopulmonary  abnormality. Electronically Signed   By: Genevie Ann M.D.   On: 03/11/2019 20:32    Medications:  I have reviewed the patient's current medications. Scheduled: . diltiazem  240 mg Oral Daily  . diltiazem  5 mg Intravenous Once  . metoprolol tartrate  25 mg Oral Daily  . rivaroxaban  20 mg Oral Q supper  . sodium chloride flush  3 mL Intravenous Q12H  . sodium chloride flush  3 mL Intravenous Q12H    Assessment/Plan: Patient without new neurological complaints.  Started back on anticoagulation.  Carotid dopplers show 50-59% stenosis of the right ICA.  Left ICA without hemodynamically significant stenosis.  CTA recommended.  CTA shows no hemodynamically significant stenosis bilaterally.  Echocardiogram shows patient to be in atrial fibrillation with an EF of 55-60%.    Recommendations: 1. Agree with anticoagulation 2. No further neurologic intervention is recommended at this time.  If further questions arise,  please call or page at that time.  Thank you for allowing neurology to participate in the care of this patient.    LOS: 2 days   Alexis Goodell, MD Neurology [phone] 03/13/2019  11:37 AM

## 2019-03-13 NOTE — NC FL2 (Signed)
[place] LEVEL OF CARE SCREENING TOOL     IDENTIFICATION  Patient Name: [name] [name] Birthdate: 1941-02-06 Sex: female Admission Date (Current Location): 03/11/2019  [place] and [place] Number:  Engineering geologist and Address:  [facility], [address] [place], [place] [address]      Provider Number: [id number]  Attending Physician Name and Address:  Nicholes Mango, MD  Relative Name and Phone Number:       Current Level of Care: Hospital Recommended Level of Care: [place] Prior Approval Number:    Date Approved/Denied:   PASRR Number: [id number]  Discharge Plan: SNF    Current Diagnoses: Patient Active Problem List   Diagnosis Date Noted  . Sepsis ([place]) 03/11/2019  . Expressive aphasia 03/11/2019  . Atrial fibrillation with RVR ([place]) 03/11/2019  . Irritable bladder 09/12/2018  . Lumbar radiculopathy 05/13/2018  . Chronic pain syndrome 05/13/2018  . Lumbar degenerative disc disease 05/13/2018  . Osteoporosis 08/02/2016  . History of colon polyps 08/02/2016  . Low back pain 06/20/2016  . S/P total knee arthroplasty 03/01/2016  . Knee osteoarthritis 02/16/2016  . Weakness 09/27/2015  . 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 History of CVA (cerebrovascular accident) 02/16/2014  . Seizures ([place]) 02/16/2014  . Chronic leg pain 02/16/2014  . Fatigue 07/02/2013  . HYPERTENSION, BENIGN 12/07/2010  . Atrial fibrillation, chronic 06/29/2009    Orientation RESPIRATION BLADDER Height & Weight     Self, Time, Situation, Place  Normal Continent Weight: 156 lb 4.8 oz (70.9 kg) Height:  5\' 2"  (157.5 cm)  BEHAVIORAL SYMPTOMS/MOOD NEUROLOGICAL BOWEL NUTRITION STATUS      Continent Diet(Heart Healthy, give pills with water but if any trouble, give whole with puree. Aspiration precautions)  AMBULATORY STATUS COMMUNICATION OF NEEDS Skin   Extensive Assist Non-Verbally(pt with  expressive aphasia) Normal                       Personal Care Assistance Level of Assistance  Bathing, Feeding, Dressing Bathing Assistance: Limited assistance Feeding assistance: Independent Dressing Assistance: Limited assistance     Functional Limitations Info  Sight, Hearing, Speech Sight Info: Adequate Hearing Info: Adequate Speech Info: Impaired    SPECIAL CARE FACTORS FREQUENCY  PT (By licensed PT), OT (By licensed OT), Speech therapy     PT Frequency: 5 times week OT Frequency: 3-5 times week     Speech Therapy Frequency: 3-5 times week      Contractures Contractures Info: Not present    Additional Factors Info  Code Status, Allergies Code Status Info: DNR Allergies Info: Aspirin           Current Medications (03/13/2019):  This is the current hospital active medication list Current Facility-Administered Medications  Medication Dose Route Frequency Provider Last Rate Last Dose  . 0.9 %  sodium chloride infusion   Intravenous Continuous Gouru, Aruna, MD      . acetaminophen (TYLENOL) tablet 650 mg  650 mg Oral Q6H PRN Lance Coon, MD       Or  . acetaminophen (TYLENOL) suppository 650 mg  650 mg Rectal Q6H PRN Lance Coon, MD      . ceFEPIme (MAXIPIME) 2 g in sodium chloride 0.9 % 100 mL IVPB  2 g Intravenous Q12H Lance Coon, MD   Stopped at 03/13/19 1253  . diltiazem (CARDIZEM CD) 24 hr capsule 240 mg  240 mg Oral Daily Harrie Foreman,  MD   240 mg at 03/13/19 1112  . diltiazem (CARDIZEM) injection 5 mg  5 mg Intravenous Once Lance Coon, MD      . metoprolol tartrate (LOPRESSOR) tablet 25 mg  25 mg Oral Daily Harrie Foreman, MD   25 mg at 03/13/19 1113  . ondansetron (ZOFRAN) tablet 4 mg  4 mg Oral Q6H PRN Lance Coon, MD       Or  . ondansetron [facility]) injection 4 mg  4 mg Intravenous Q6H PRN Lance Coon, MD      . rivaroxaban Alveda Reasons) tablet 20 mg  20 mg Oral Q supper Gouru, Aruna, MD   20 mg at 03/13/19 1210  . sodium  chloride flush (NS) 0.9 % injection 3 mL  3 mL Intravenous Q12H Gouru, Aruna, MD   3 mL at 03/13/19 1140  . sodium chloride flush (NS) 0.9 % injection 3 mL  3 mL Intravenous Q12H Gouru, Aruna, MD   3 mL at 03/13/19 1140  . sodium chloride flush (NS) 0.9 % injection 3 mL  3 mL Intravenous PRN Gouru, Aruna, MD      . vancomycin (VANCOCIN) IVPB 1000 mg/200 mL premix  1,000 mg Intravenous Q24H Lance Coon, MD 200 mL/hr at 03/13/19 0122 1,000 mg at 03/13/19 0122     Discharge Medications: Please see discharge summary for a list of discharge medications.  Relevant Imaging Results:  Relevant Lab Results:   Additional Information SSN: [id number] LCSW

## 2019-03-13 NOTE — Progress Notes (Signed)
  Speech Language Pathology Treatment: Cognitive-Linquistic  Patient Details Name: [name] [name] MRN: [id number] DOB: 01-08-1941 Today's Date: 03/13/2019 Time: 3557-3220 SLP Time Calculation (min) (ACUTE ONLY): 30 min  Assessment / Plan / Recommendation Clinical Impression  The patient is presenting with moderate aphasia characterized by relatively fluent, low content spontaneous speech, impaired repetition, anomia, and reduced comprehension for complex/abstract verbal information.  The patient will benefit from intensive speech therapy after discharge.   HPI HPI: Pt is a 78 yo female admitted for sepsis, AMS, recent back surgery 5/18 per chart (laminectomy), work up showed MRI + for acute infarct: "reduced diffusion within the left lateral temporal lobe extending into left posterior insula compatible with acute/early subacute infarction". PMH of afib, DVT, heart murmur, seizure, possible stroke 1 month ago. She recently underwent laminectomy for degenerative disc disease of her lumbar spine.  Patient states this was done 4 days ago.  In the ED she was also found to be febrile, tachycardic, though she has chronic A. fib she was found near to be in RVR, and to have an elevated white blood cell count.  Pt has been Off of her anticoagulation due to recent surgery.  Patient brought to the ED for evaluation for complaint of confusion and difficulty with speech.  Family stated that the patient was having difficulty getting all of her words out and that she seemed confused at times.       SLP Plan  Continue with current plan of care       Recommendations                   Follow up Recommendations: Inpatient Rehab SLP Visit Diagnosis: Aphasia (R47.01) Plan: Continue with current plan of care       [place], [place], Daine Floras 03/13/2019, 3:11 PM

## 2019-03-13 NOTE — Progress Notes (Signed)
[place] [facility] [place] NAME: [name] [name]    MR#:  [id number]  DATE OF BIRTH:  07-14-1941  SUBJECTIVE:  CHIEF COMPLAINT: Patient is feeling better but still repeating words, having hard time to recall few words just had lumbar laminectomy on 03/10/2019 Her Xarelto is on hold at this time  REVIEW OF SYSTEMS:  CONSTITUTIONAL: No fever, fatigue or weakness.  EYES: No blurred or double vision.  EARS, NOSE, AND THROAT: No tinnitus or ear pain.  RESPIRATORY: No cough, shortness of breath, wheezing or hemoptysis.  CARDIOVASCULAR: No chest pain, orthopnea, edema.  GASTROINTESTINAL: No nausea, vomiting, diarrhea or abdominal pain.  GENITOURINARY: No dysuria, hematuria.  ENDOCRINE: No polyuria, nocturia,  HEMATOLOGY: No anemia, easy bruising or bleeding SKIN: No rash or lesion. MUSCULOSKELETAL: No joint pain or arthritis.   NEUROLOGIC: No tingling, numbness, weakness.  PSYCHIATRY: No anxiety or depression.   DRUG ALLERGIES:   Allergies  Allergen Reactions  . Aspirin Other (See Comments)    On xarelto    VITALS:  Blood pressure 128/73, pulse 93, temperature 99 F (37.2 C), temperature source Oral, resp. rate 16, height '5\' 2"'  (1.575 m), weight 70.9 kg, SpO2 95 %.  PHYSICAL EXAMINATION:  GENERAL:  78 y.o.-year-old patient lying in the bed with no acute distress.  EYES: Pupils equal, round, reactive to light and accommodation. No scleral icterus. Extraocular muscles intact.  HEENT: Head atraumatic, normocephalic. Oropharynx and nasopharynx clear.  NECK:  Supple, no jugular venous distention. No thyroid enlargement, no tenderness.  LUNGS: Normal breath sounds bilaterally, no wheezing, rales,rhonchi or crepitation. No use of accessory muscles of respiration.  CARDIOVASCULAR: S1, S2 normal. No murmurs, rubs, or gallops.  ABDOMEN: Soft, nontender, nondistended. Bowel sounds present.  EXTREMITIES: No pedal edema, cyanosis, or clubbing.   NEUROLOGIC: Awake, alert and oriented x3, echolalia, motor grossly intact at her baseline, sensation intact. Gait not checked.  PSYCHIATRIC: The patient is alert and oriented x 3.  SKIN: No obvious rash, lesion, or ulcer.    LABORATORY PANEL:   CBC Recent Labs  Lab 03/13/19 0507  WBC 8.3  HGB 12.7  HCT 37.9  PLT 138*   ------------------------------------------------------------------------------------------------------------------  Chemistries  Recent Labs  Lab 03/11/19 1944 03/12/19 0104  NA 138 140  K 3.8 3.8  CL 102 108  CO2 26 24  GLUCOSE 131* 132*  BUN 16 16  CREATININE 0.77 0.60  CALCIUM 10.3 9.3  AST 26  --   ALT 16  --   ALKPHOS 45  --   BILITOT 1.2  --    ------------------------------------------------------------------------------------------------------------------  Cardiac Enzymes No results for input(s): TROPONINI in the last 168 hours. ------------------------------------------------------------------------------------------------------------------  RADIOLOGY:  Ct Head Wo Contrast  Result Date: 03/11/2019 CLINICAL DATA:  78 year old female with encephalopathy, abnormal speech since last night. EXAM: CT HEAD WITHOUT CONTRAST TECHNIQUE: Contiguous axial images were obtained from the base of the skull through the vertex without intravenous contrast. COMPARISON:  Head CT 09/13/2018 and earlier. FINDINGS: Brain: Small chronic dural calcifications. Chronic subcortical encephalomalacia in the anterior left superior frontal gyrus is stable. Patchy and scattered bilateral subcortical white matter hypodensity elsewhere appears stable. Stable cerebral volume. No midline shift, ventriculomegaly, mass effect, evidence of mass lesion, intracranial hemorrhage or evidence of cortically based acute infarction. Vascular: Calcified atherosclerosis at the skull base. No suspicious intracranial vascular hyperdensity. Skull: No acute osseous abnormality identified.  Sinuses/Orbits: Visualized paranasal sinuses and mastoids are stable and well pneumatized. Other: No acute orbit or scalp  soft tissue findings. IMPRESSION: 1. No acute intracranial abnormality. 2. Stable non contrast CT appearance of the brain since 2019 with chronic white matter disease. Electronically Signed   By: Genevie Ann M.D.   On: 03/11/2019 20:31   Ct Angio Neck W Or Wo Contrast  Result Date: 03/13/2019 CLINICAL DATA:  History of stroke.  Abnormal carotid Doppler EXAM: CT ANGIOGRAPHY NECK TECHNIQUE: Multidetector CT imaging of the neck was performed using the standard protocol during bolus administration of intravenous contrast. Multiplanar CT image reconstructions and MIPs were obtained to evaluate the vascular anatomy. Carotid stenosis measurements (when applicable) are obtained utilizing NASCET criteria, using the distal internal carotid diameter as the denominator. CONTRAST:  39m OMNIPAQUE IOHEXOL 350 MG/ML SOLN COMPARISON:  Carotid Doppler 03/12/2019 FINDINGS: Aortic arch: Aberrant right subclavian artery passing posterior to the esophagus. Negative for aortic aneurysm or dissection. Proximal great vessels widely patent. Right carotid system: Right carotid bifurcation widely patent with minimal atherosclerotic calcification Left carotid system: Left carotid bifurcation widely patent with minimal atherosclerotic calcification Vertebral arteries: Both vertebral arteries widely patent without significant stenosis. Mild atherosclerotic calcification at the level of the skull base. Skeleton: Cervical spondylosis.  No acute skeletal abnormality. Other neck: Negative Upper chest: Lung apices clear bilaterally. IMPRESSION: No significant carotid or vertebral stenosis in the neck. Minimal atherosclerotic disease. Aberrant right subclavian artery. Electronically Signed   By: CFranchot GalloM.D.   On: 03/13/2019 10:50   Mr Brain Wo Contrast  Result Date: 03/12/2019 CLINICAL DATA:  78y/o  F; confusion and  aphasia. EXAM: MRI HEAD WITHOUT CONTRAST TECHNIQUE: Multiplanar, multiecho pulse sequences of the brain and surrounding structures were obtained without intravenous contrast. COMPARISON:  03/11/2019 CT of the head.  10/28/2015 MRI of the head. FINDINGS: Brain: Predominantly cortical reduced diffusion within the left lateral temporal lobe extending into the posterior insula compatible with acute/early subacute infarction. No associated hemorrhage or mass effect. Early confluent nonspecific T2 FLAIR hyperintensities in subcortical and periventricular white matter are compatible with moderate chronic microvascular ischemic changes. Moderate volume loss of the brain. There are small chronic cortical infarctions within the left superior frontal lobe and the right lateral frontal lobe. No extra-axial collection, hydrocephalus, mass effect, or herniation. Vascular: Normal flow voids. Skull and upper cervical spine: Normal marrow signal. Sinuses/Orbits: Negative. Other: Bilateral intra-ocular lens replacement. IMPRESSION: 1. Reduced diffusion within the left lateral temporal lobe extending into left posterior insula compatible with acute/early subacute infarction. No associated hemorrhage or mass effect. 2. Stable moderate chronic microvascular ischemic changes and volume loss of the brain. Small chronic cortical infarcts within left superior frontal lobe and right lateral frontal lobe. These results were called by telephone at the time of interpretation on 03/12/2019 at 1:01 am to Dr. BOwens Shark who verbally acknowledged these results. Electronically Signed   By: LKristine GarbeM.D.   On: 03/12/2019 01:04   Mr Lumbar Spine Wo Contrast  Result Date: 03/12/2019 CLINICAL DATA:  78y/o F; laminectomy on Monday. Now with sepsis and altered mental status. EXAM: MRI LUMBAR SPINE WITHOUT CONTRAST TECHNIQUE: Multiplanar, multisequence MR imaging of the lumbar spine was performed. No intravenous contrast was administered.  COMPARISON:  06/07/2016 lumbar spine MRI. FINDINGS: Segmentation:  Standard. Alignment: Severe dextrocurvature with apex at L2. L5-S1 grade 1 anterolisthesis. Right L5 pars defect, likely chronic. Vertebrae: Postsurgical changes related to a right L3-4 hemilaminectomy. There is a small fluid collection within the subcutaneous fat at the level of the laminectomy measuring up to 3 cm as well as  edema within the paraspinal muscles and the laminectomy bed. No epidural fluid collection is evident on this noncontrast examination. No findings of discitis osteomyelitis. There are left L2-3 and bilateral L5-S1 facet effusions with mild associated facet edema. No bony destructive changes are evident. Conus medullaris and cauda equina: Conus extends to the L1 level. There is mild crowding of the cauda equina at the L2-3 and L3-4 levels due to spinal canal stenosis. Paraspinal and other soft tissues: As above. Disc levels: T10-11: No significant disc displacement, foraminal stenosis, or spinal canal stenosis. T11-12: In plate marginal osteophytes eccentric to the left foraminal and extraforaminal zone as well as left-greater-than-right facet arthropathy result in moderate left neural foraminal stenosis. No significant spinal canal stenosis. T12-L1: Small left subarticular disc protrusion, endplate marginal osteophytes eccentric to the left foraminal and extraforaminal zone, and facet hypertrophy greater on the left result in mild-to-moderate stenosis of the left lateral recess and the neural foramen. No significant spinal canal stenosis. L1-2: Endplate marginal osteophytes eccentric to the left extraforaminal zone combined with left-greater-than-right facet hypertrophy result in mild left-sided neural foraminal stenosis. No significant spinal canal stenosis. L2-3: Diffuse disc bulge with prominent bilateral facet and ligamentum flavum hypertrophy eccentric to the left. Mild right and moderate left foraminal stenosis. Moderate  spinal canal stenosis with crowding of the cauda equina. L3-4: Disc bulge, right hemilaminectomy, and severe bulky right-sided facet hypertrophy. Severe right and mild left neural foraminal stenosis. Mild residual spinal canal stenosis largely due to right-sided facet hypertrophy effacing the right lateral recess. L4-5: Mild disc bulge with severe right-sided facet hypertrophy as well as endplate marginal osteophytes in the right foraminal and extraforaminal zone. Severe right neural foraminal stenosis. No significant spinal canal stenosis. L5-S1: Grade 1 anterolisthesis with a small uncovered disc bulge mild left facet hypertrophy, and severe right facet hypertrophy. Severe right and mild left neural foraminal stenosis. Partial effacement of the lateral recesses. Mild spinal canal stenosis. IMPRESSION: 1. Expected postsurgical changes related to a right L3-4 hemilaminectomy. Small fluid collection within the subcutaneous fat at the level of surgery is likely a postoperative seroma. No findings of discitis osteomyelitis. No epidural fluid collection on this noncontrast examination. 2. Facet edema and effusions at the left L2-3 and bilateral L5-S1 levels favored to represent degenerative arthritis over septic arthritis. 3. Severe dextroscoliosis of the lumbar spine and grade 1 L5-S1 anterolisthesis. Advanced lumbar spondylosis with prominent upper lumbar left-sided and lower lumbar right-sided disc and facet degenerative changes. Spinal canal stenosis is greatest at the L2-3 level where it is moderate with crowding of the cauda equina. Multilevel high-grade neural foraminal stenosis. Electronically Signed   By: Kristine Garbe M.D.   On: 03/12/2019 01:23   US Carotid Bilateral (at [facility] And [facility] Only)  Result Date: 03/12/2019 CLINICAL DATA:  Stroke.  History of hypertension. EXAM: BILATERAL CAROTID DUPLEX ULTRASOUND TECHNIQUE: Pearline Cables scale imaging, color Doppler and duplex ultrasound were performed of  bilateral carotid and vertebral arteries in the neck. COMPARISON:  None. FINDINGS: Criteria: Quantification of carotid stenosis is based on velocity parameters that correlate the residual internal carotid diameter with NASCET-based stenosis levels, using the diameter of the distal internal carotid lumen as the denominator for stenosis measurement. The following velocity measurements were obtained: RIGHT ICA: 138/15 cm/sec CCA: 16/10 cm/sec SYSTOLIC ICA/CCA RATIO:  1.6 ECA: 92 cm/sec LEFT ICA: 111/16 cm/sec CCA: 960/4 cm/sec SYSTOLIC ICA/CCA RATIO:  1.1 ECA: 103 cm/sec RIGHT CAROTID ARTERY: There is a minimal to moderate amount of eccentric echogenic plaque within  the right carotid bulb extending to involve the origin and proximal aspects of the right internal carotid artery (images 15 and 22), which results in mildly elevated peak systolic velocities within the mid aspect of the right internal carotid artery. Greatest acquired peak systolic velocity within mid right ICA measures 138 centimeters/second (image 27). RIGHT VERTEBRAL ARTERY:  Antegrade Flow LEFT CAROTID ARTERY: There is a minimal amount of eccentric echogenic plaque involving the origin and proximal aspects of the left internal carotid artery (image 54), not resulting in elevated peak systolic velocities within the interrogated course of the left internal carotid artery to suggest a hemodynamically significant stenosis. LEFT VERTEBRAL ARTERY:  Antegrade flow IMPRESSION: 1. Minimal to moderate amount of right-sided atherosclerotic plaque results in mildly elevated peak systolic velocities with the right internal carotid artery compatible with the lower end of the 50-69% luminal narrowing range. Further evaluation with CTA could performed as clinically indicated. 2. Minimal amount of left-sided atherosclerotic plaque, not resulting in a hemodynamically significant stenosis. Electronically Signed   By: Sandi Mariscal M.D.   On: 03/12/2019 16:06   Dg Chest  Port 1 View  Result Date: 03/11/2019 CLINICAL DATA:  78 year old female with altered mental status, fever. EXAM: PORTABLE CHEST 1 VIEW COMPARISON:  Chest radiographs 11/29/2017 and earlier. FINDINGS: AP seated view at 2005 hours. Lower lung volumes. Normal cardiac size and mediastinal contours. Visualized tracheal air column is within normal limits. No pneumothorax, pulmonary edema, pleural effusion or confluent pulmonary opacity. Negative visible bowel gas pattern. No acute osseous abnormality identified. Chronic scoliosis. IMPRESSION: Lower lung volumes.  No acute cardiopulmonary abnormality. Electronically Signed   By: Genevie Ann M.D.   On: 03/11/2019 20:32    EKG:   Orders placed or performed in visit on 02/15/17  . EKG 12-Lead    ASSESSMENT AND PLAN:   #Sepsis-unclear source Patient met septic criteria at the time of admission with fever and tachycardia Blood cultures are negative, chest x-ray negative Urine culture negative COVID negative We will check respiratory panel  Continue broad-spectrum IV antibiotics, IV fluids Lactic acid  1.1-2.3 procalcitonin less than 0.10  #Acute/subacute stroke left lateral temporal lobe with  previous history of strokes Currently on heparin drip Xarelto on hold in view of recent lumbar laminectomy Patient has passed bedside swallow evaluation MRI of the brain with acute/subacute left lateral temporal lobe infarct Carotid Dopplers abnormal and ordered CT angiogram of the neck  echocardiogram are pending Physical therapy is recommending home health PT 24-hour assistance and supervision LDL 67 Neurology consult placed, Dr. Doy Mince is following the patient  #Atrial fibrillation with RVR Patient home medications Xarelto resumed and heparin drip discontinued  #History of lumbar degenerative disc disease status post lumbar laminectomy on 03/10/2019 Consult placed to Dr. Mack Guise, he has discussed with  the spine surgeon Recommending outpatient  follow-up and resuming Xarelto  #Hypertension Allow permissive hypertension initially will titrate blood pressure medicines for better control as needed.  Currently blood pressure stable    All the records are reviewed and case discussed with Care Management/Social Workerr. Management plans discussed with the patient at bedside and daughter [name] over [phone] and they are in agreement.  CODE STATUS: DO NOT RESUSCITATE, daughter is the healthcare POA  TOTAL TIME TAKING CARE OF THIS PATIENT: 36 minutes.   POSSIBLE D/C IN 1-2 DAYS, DEPENDING ON CLINICAL CONDITION.  Note: This dictation was prepared with Dragon dictation along with smaller phrase technology. Any transcriptional errors that result from this process are unintentional.  Nicholes Mango M.D on 03/13/2019 at 12:25 PM  Between 7am to 6pm - Pager - [phone] After 6pm go to [email or web address] - password [email or web address] [facility]  Office  [phone]  CC: Primary care physician; Birdie Sons, MD

## 2019-03-13 NOTE — Progress Notes (Signed)
Physical Therapy Treatment Patient Details Name: [name] [name] MRN: [id number] DOB: 02-13-1941 Today's Date: 03/13/2019    History of Present Illness Pt is 78 yo female admitted for sepsis, AMS, recent back surgery 5/18 per chart (laminectomy), work up showed MRI + for acute infarct, Reduced diffusion within the left lateral temporal lobe extending into left posterior insula compatible with acute/early subacute infarction. PMH of afib, DVT, heart murmur, seizure, possible stroke    PT Comments    Patient alert, mildly improved communication skills this session. More fluid sentences, but some inappropriate chosen words as well as difficulty intermittently with word finding. Pt mobilized to EOB with supervision and use of bed rails. Pt exhibited pain with mobility due to recent lumbar surgery, as well as extended time to perform to maximize independence. Pt performed sit <> stand from bed with RW, verbal cues for hand placement and CGA. Pt ambulated ~80ft total during session, MinA for RW management to increase safety, as well as intermittent verbal cues. Pt fatigued at end of session. HR monitored throughout, ranging from 120s-130s while mobilizing. The patient demonstrated improved mobility this session, though still limited by fatigue and HR.   Updated discharge recommendations changed to CIR, the patient would benefit from acute inpatient rehab upon discharge for high-intensity, post-acute rehab services. Pt has supportive daughter as well as motivation to return to PLOF.      Follow Up Recommendations  CIR     Equipment Recommendations  Other (comment)(TBD)    Recommendations for Other Services OT consult;Speech consult     Precautions / Restrictions Precautions Precautions: Fall Precaution Comments: watch HR Restrictions Weight Bearing Restrictions: No    Mobility  Bed Mobility Overal bed mobility: Needs Assistance Bed Mobility: Supine to Sit     Supine to sit:  Supervision     General bed mobility comments: Pt with improved bed mobility this session, but did utilize bed rails, grimaces with back pain due to recent lumbar surgery  Transfers Overall transfer level: Needs assistance Equipment used: Rolling walker (2 wheeled) Transfers: Sit to/from Stand Sit to Stand: Min guard         General transfer comment: x2 this session, from bed and from standard commode  Ambulation/Gait Ambulation/Gait assistance: Min guard;Min assist Gait Distance (Feet): 18 Feet Assistive device: Rolling walker (2 wheeled)       General Gait Details: Pt able to transfer to bedside chair, mild fatigue noted. HR ranging from 110s-high 130s. After rest break ambulated to commode with minA for RW management and to increase safety   Stairs             Wheelchair Mobility    Modified Rankin (Stroke Patients Only)       Balance Overall balance assessment: Needs assistance Sitting-balance support: Feet supported Sitting balance-Leahy Scale: Fair       Standing balance-Leahy Scale: Fair                              Cognition Arousal/Alertness: Awake/alert Behavior During Therapy: WFL for tasks assessed/performed                                   General Comments: Pt oriented to self, aware of situation but difficulty answering some questions due to aphasia. Improved sentences from yesterday, but less potential awareness  of mistakes      Exercises  General Comments        Pertinent Vitals/Pain Pain Assessment: No/denies pain    Home Living                      Prior Function            PT Goals (current goals can now be found in the care plan section) Progress towards PT goals: Progressing toward goals    Frequency    7X/week      PT Plan Discharge plan needs to be updated    Co-evaluation              AM-PAC PT "6 Clicks" Mobility   Outcome Measure  Help needed turning  from your back to your side while in a flat bed without using bedrails?: A Little Help needed moving from lying on your back to sitting on the side of a flat bed without using bedrails?: A Little Help needed moving to and from a bed to a chair (including a wheelchair)?: A Little Help needed standing up from a chair using your arms (e.g., wheelchair or bedside chair)?: A Little Help needed to walk in hospital room?: A Lot Help needed climbing 3-5 steps with a railing? : A Lot 6 Click Score: 16    End of Session Equipment Utilized During Treatment: Gait belt Activity Tolerance: Patient limited by fatigue;Other (comment)(HR elevated) Patient left: with call bell/phone within reach;in chair;with chair alarm set Nurse Communication: Mobility status;Other (comment) PT Visit Diagnosis: Muscle weakness (generalized) (M62.81);Difficulty in walking, not elsewhere classified (R26.2);Other symptoms and signs involving the nervous system (R29.898);Other abnormalities of gait and mobility (R26.89)     Time: 9798-9211 PT Time Calculation (min) (ACUTE ONLY): 38 min  Charges:  $Therapeutic Activity: 23-37 mins                     Lieutenant Diego PT, DPT 12:21 PM,03/13/19 [phone]

## 2019-03-13 NOTE — Plan of Care (Signed)
  Problem: Clinical Measurements: Goal: Cardiovascular complication will be avoided Outcome: Progressing   Problem: Self-Care: Goal: Ability to communicate needs accurately will improve Outcome: Progressing

## 2019-03-13 NOTE — Progress Notes (Signed)
[place] for heparin drip Indication: atrial fibrillation  Allergies  Allergen Reactions  . Aspirin Other (See Comments)    On xarelto    Patient Measurements: Height: 5\' 2"  (157.5 cm) Weight: 156 lb 4.8 oz (70.9 kg) IBW/kg (Calculated) : 50.1 Heparin Dosing Weight: 55.4 kg  Vital Signs: Temp: 99 F (37.2 C) (05/21 0727) Temp Source: Oral (05/21 0727) BP: 128/73 (05/21 0727) Pulse Rate: 93 (05/21 0727)  Labs: Recent Labs    03/11/19 1944 03/12/19 0104  03/12/19 1353 03/12/19 2259 03/13/19 0507 03/13/19 0706  HGB 14.2 13.1  --   --   --  12.7  --   HCT 42.8 40.2  --   --   --  37.9  --   PLT 161 139*  --   --   --  138*  --   APTT  --  37*   < > 59* 58*  --  65*  LABPROT  --  18.7*  --   --   --   --   --   INR  --  1.6*  --   --   --   --   --   HEPARINUNFRC  --  2.22*  --   --   --  0.43  --   CREATININE 0.77 0.60  --   --   --   --   --    < > = values in this interval not displayed.    Estimated Creatinine Clearance: 55.2 mL/min (by C-G formula based on SCr of 0.6 mg/dL).   Medical History: Past Medical History:  Diagnosis Date  . Arthritis    osteo; in B knees;   . Bladder disorder   . Chicken pox   . Chronic a-fib   . DVT (deep venous thrombosis) ([place])    H/O  . Dyspnea   . Dysrhythmia    a fib  . Edema   . Hay fever   . Heart murmur   . Hypertension    controlled  . Irregular heart beat   . Seizure ([place])    2 years ago knocked unconscious; hospitalized and diagnosed with seizures; no spells in last year;   . Stroke [facility])    1 month ago (possible stroke)  . Unspecified atrial fibrillation (HCC)     Medications:  Scheduled:  . diltiazem  240 mg Oral Daily  . diltiazem  5 mg Intravenous Once  . metoprolol tartrate  25 mg Oral Daily    Assessment: Patient arrives to ED for aphasia w/ h/o DVT and afib anticoagulated w/ xarelto 20 mg daily PTA (last dose unknown) per note patient was suppose to  "start again tonight." CT head negative. EKG pending. Patient is being started on heparin drip for anticoagulation of afib. Baseline heparin level was elevated at 2.22.  Started on 650 units/hr  5/20 @0648  aPTT 49 rebolus w/ heparin 1500 units IV x 1 and increase rate to 800 units/hr 5/20 @1353  aPTT 59 rebolus w/ heparin 1000 units x1 and increase rate to 900 units/hr  05/20 @ 2259 aPTT 58 seconds. Will rebolus w/ heparin 1000 units IV x 1 and increase rate to 1000 units/hr  Goal of Therapy:  APTT 66 - 102 seconds Heparin level 0.3-0.7 units/ml when heparin level and aPTT correlate.  Monitor platelets by anticoagulation protocol: Yes   Plan:  05/21 @ 0706 aPTT 65 seconds, HL @ 0507= 0.43.  Barely subtherapeutic aPTT- Will increase  rate to 1100 units/hr and will recheck aPTT @ 1430, if therapeutic will likely transition to following HL.  Will continue to monitor CBC and adjust per aPTT.  Chinita Greenland PharmD Clinical Pharmacist 03/13/2019

## 2019-03-13 NOTE — Progress Notes (Signed)
Occupational Therapy Treatment Patient Details Name: [name] [name] MRN: [id number] DOB: [birth year] Today's Date: 03/13/2019    History of present illness Pt is 78 yo female admitted for sepsis, AMS, recent back surgery 5/18 per chart (laminectomy), work up showed MRI + for acute infarct, Reduced diffusion within the left lateral temporal lobe extending into left posterior insula compatible with acute/early subacute infarction. PMH of afib, DVT, heart murmur, seizure, possible stroke   OT comments  Pt alert and sitting up in bed.  She was able to state her first name but struggled with complete syllables of last name.  Dietary called to get her lunch tray and needed  Mod cues to complete order but did well with yes/no with verbal list of options.  Worked on identification of toothbrush and hair brush with mod cues and hand over hand to complete task using R hand.  Pt able to follow simple commands for bed mobility to re-position in bed and sit EOB with min assist and cues.  Pt was trying to communicate that she had an injury to R UE and pointed to rotator cuff area and unable to focus on LB dressing activity due to trying to communicate about past shoulder injury.  Will continue to address LB dressing skills.    Follow Up Recommendations  SNF    Equipment Recommendations  3 in 1 bedside commode    Recommendations for Other Services      Precautions / Restrictions Precautions Precautions: Fall Precaution Comments: watch HR Restrictions Weight Bearing Restrictions: No       Mobility Bed Mobility Overal bed mobility: Needs Assistance Bed Mobility: Supine to Sit     Supine to sit: Supervision     General bed mobility comments: Pt with improved bed mobility this session, but did utilize bed rails, grimaces with back pain due to recent lumbar surgery  Transfers Overall transfer level: Needs assistance Equipment used: Rolling walker (2 wheeled) Transfers: Sit to/from Stand Sit  to Stand: Min guard         General transfer comment: x2 this session, from bed and from standard commode    Balance Overall balance assessment: Needs assistance Sitting-balance support: Feet supported Sitting balance-Leahy Scale: Fair       Standing balance-Leahy Scale: Fair                             ADL either performed or assessed with clinical judgement   ADL Overall ADL's : Needs assistance/impaired                                       General ADL Comments: Worked on identification of toothbrush and hair brush with mod cues and hand over hand to complete task using R hand.  Pt able to follow simple commands for bed mobility to re-position in bed and sit EOB with min assist and cues.  Pt was trying to communicate that she had an injury to R UE and pointed to rotator cuff area.       Vision       Perception     Praxis      Cognition Arousal/Alertness: Awake/alert Behavior During Therapy: WFL for tasks assessed/performed Overall Cognitive Status: Difficult to assess  General Comments: Pt trying to express herself and worked on [employer] with mod cues.        Exercises     Shoulder Instructions       General Comments      Pertinent Vitals/ Pain       Pain Assessment: No/denies pain  Home Living                                          Prior Functioning/Environment              Frequency  Min 1X/week        Progress Toward Goals  OT Goals(current goals can now be found in the care plan section)  Progress towards OT goals: Progressing toward goals  Acute Rehab OT Goals Patient Stated Goal: Pt indicated she wanted to return to normal OT Goal Formulation: With patient Time For Goal Achievement: 03/26/19 Potential to Achieve Goals: Good  Plan Discharge plan remains appropriate    Co-evaluation                 AM-PAC OT "6 Clicks"  Daily Activity     Outcome Measure   Help from another person eating meals?: None Help from another person taking care of personal grooming?: None Help from another person toileting, which includes using toliet, bedpan, or urinal?: A Little Help from another person bathing (including washing, rinsing, drying)?: A Lot Help from another person to put on and taking off regular upper body clothing?: A Little Help from another person to put on and taking off regular lower body clothing?: A Lot 6 Click Score: 18    End of Session    OT Visit Diagnosis: Other abnormalities of gait and mobility (R26.89);Muscle weakness (generalized) (M62.81);Pain;Cognitive communication deficit (R41.841) Symptoms and signs involving cognitive functions: Cerebral infarction   Activity Tolerance Patient tolerated treatment well   Patient Left in bed;with call bell/phone within reach;with bed alarm set   Nurse Communication          Time: 0258-5277 OT Time Calculation (min): 30 min  Charges: OT General Charges $OT Visit: 1 Visit OT Treatments $Self Care/Home Management : 23-37 mins  Chrys Racer, OTR/L ascom [phone] 03/13/19, 12:29 PM

## 2019-03-13 NOTE — Progress Notes (Signed)
CRITICAL VALUE ALERT  Critical Value:  Lactic Acid 2.3  Date & Time Notied:  5/21 1120  Provider Notified: Dr Margaretmary Eddy  Orders Received/Actions taken:none at this time

## 2019-03-13 NOTE — Progress Notes (Signed)
ANTICOAGULATION CONSULT NOTE - Initial Consult  Pharmacy Consult for Xarelto Indication: atrial fibrillation  Allergies  Allergen Reactions  . Aspirin Other (See Comments)    On xarelto    Patient Measurements: Height: 5\' 2"  (157.5 cm) Weight: 156 lb 4.8 oz (70.9 kg) IBW/kg (Calculated) : 50.1 Heparin Dosing Weight:    Vital Signs: Temp: 99 F (37.2 C) (05/21 0727) Temp Source: Oral (05/21 0727) BP: 128/73 (05/21 0727) Pulse Rate: 93 (05/21 0727)  Labs: Recent Labs    03/11/19 1944 03/12/19 0104  03/12/19 1353 03/12/19 2259 03/13/19 0507 03/13/19 0706  HGB 14.2 13.1  --   --   --  12.7  --   HCT 42.8 40.2  --   --   --  37.9  --   PLT 161 139*  --   --   --  138*  --   APTT  --  37*   < > 59* 58*  --  65*  LABPROT  --  18.7*  --   --   --   --   --   INR  --  1.6*  --   --   --   --   --   HEPARINUNFRC  --  2.22*  --   --   --  0.43  --   CREATININE 0.77 0.60  --   --   --   --   --    < > = values in this interval not displayed.    Estimated Creatinine Clearance: 55.2 mL/min (by C-G formula based on SCr of 0.6 mg/dL).   Medical History: Past Medical History:  Diagnosis Date  . Arthritis    osteo; in B knees;   . Bladder disorder   . Chicken pox   . Chronic a-fib   . DVT (deep venous thrombosis) ([place])    H/O  . Dyspnea   . Dysrhythmia    a fib  . Edema   . Hay fever   . Heart murmur   . Hypertension    controlled  . Irregular heart beat   . Seizure ([place])    2 years ago knocked unconscious; hospitalized and diagnosed with seizures; no spells in last year;   . Stroke [facility])    1 month ago (possible stroke)  . Unspecified atrial fibrillation (HCC)     Medications:  Scheduled:  . diltiazem  240 mg Oral Daily  . diltiazem  5 mg Intravenous Once  . metoprolol tartrate  25 mg Oral Daily  . rivaroxaban  20 mg Oral Q supper  . sodium chloride flush  3 mL Intravenous Q12H  . sodium chloride flush  3 mL Intravenous Q12H   Infusions:  . ceFEPime  (MAXIPIME) IV 2 g (03/13/19 0012)  . vancomycin 1,000 mg (03/13/19 0122)    Assessment: Patient arrives to ED for aphasia w/ h/o DVT and afib anticoagulated w/ xarelto 20 mg daily PTA (last dose unknown) per note patient was suppose to "start again tonight."-was off d/t had surgery 3 days prior and Xarelto on hold?  CT head negative. EKG pending. Patient is being started on heparin drip for anticoagulation of afib. 5/21- Will resume Xarelto and d/c Heparin drip  Goal of Therapy:  Monitor platelets by anticoagulation protocol: Yes   Plan:  Will resume Xarelto 20 mg PO Daily and discontinue Heparin Drip at the time Xarelto is given.  Rolfe Hartsell A 03/13/2019,11:40 AM

## 2019-03-13 NOTE — TOC Progression Note (Signed)
Transition of Care [facility]) - Progression Note    Patient Details  Name: [name] [name] MRN: [id number] Date of Birth: 1940-11-26  Transition of Care [facility]) CM/SW Contact  Shade Flood, LCSW Phone Number: 03/13/2019, 2:39 PM  Clinical Narrative:     TOC following. Therapy recommending CIR for pt. Referral was entered by PT. Awaiting review and decision from CIR. Will send out for SNF as backup plan in the event that CIR declines. Updated daughter by phone. Will follow.   Expected Discharge Plan: [place] Barriers to Discharge: Continued Medical Work up  Expected Discharge Plan and Services Expected Discharge Plan: [place]: [place] arrangements for the past 2 months: Apartment                                       Social Determinants of Health (SDOH) Interventions    Readmission Risk Interventions Readmission Risk Prevention Plan 03/12/2019  Transportation Screening Complete  Some recent data might be hidden

## 2019-03-14 LAB — CBC
HCT: 38.6 % (ref 36.0–46.0)
Hemoglobin: 13.1 g/dL (ref 12.0–15.0)
MCH: 33.3 pg (ref 26.0–34.0)
MCHC: 33.9 g/dL (ref 30.0–36.0)
MCV: 98.2 fL (ref 80.0–100.0)
Platelets: 152 10*3/uL (ref 150–400)
RBC: 3.93 MIL/uL (ref 3.87–5.11)
RDW: 12.7 % (ref 11.5–15.5)
WBC: 7.5 10*3/uL (ref 4.0–10.5)
nRBC: 0 % (ref 0.0–0.2)

## 2019-03-14 LAB — CREATININE, SERUM
Creatinine, Ser: 0.43 mg/dL — ABNORMAL LOW (ref 0.44–1.00)
GFR calc Af Amer: >60 mL/min (ref >60)
GFR calc non Af Amer: >60 mL/min (ref >60)

## 2019-03-14 NOTE — Progress Notes (Signed)
[place] for Xarelto Indication: atrial fibrillation  Allergies  Allergen Reactions  . Aspirin Other (See Comments)    On xarelto    Patient Measurements: Height: 5\' 2"  (157.5 cm) Weight: 154 lb (69.9 kg) IBW/kg (Calculated) : 50.1 Heparin Dosing Weight:    Vital Signs: Temp: 98.2 F (36.8 C) (05/22 0820) Temp Source: Oral (05/22 0820) BP: 156/85 (05/22 0820) Pulse Rate: 93 (05/22 0820)  Labs: Recent Labs    03/11/19 1944 03/12/19 0104  03/12/19 1353 03/12/19 2259 03/13/19 0507 03/13/19 0706 03/14/19 0544  HGB 14.2 13.1  --   --   --  12.7  --  13.1  HCT 42.8 40.2  --   --   --  37.9  --  38.6  PLT 161 139*  --   --   --  138*  --  152  APTT  --  37*   < > 59* 58*  --  65*  --   LABPROT  --  18.7*  --   --   --   --   --   --   INR  --  1.6*  --   --   --   --   --   --   HEPARINUNFRC  --  2.22*  --   --   --  0.43  --   --   CREATININE 0.77 0.60  --   --   --   --   --  0.43*   < > = values in this interval not displayed.    Estimated Creatinine Clearance: 54.8 mL/min (A) (by C-G formula based on SCr of 0.43 mg/dL (L)).   Medical History: Past Medical History:  Diagnosis Date  . Arthritis    osteo; in B knees;   . Bladder disorder   . Chicken pox   . Chronic a-fib   . DVT (deep venous thrombosis) ([place])    H/O  . Dyspnea   . Dysrhythmia    a fib  . Edema   . Hay fever   . Heart murmur   . Hypertension    controlled  . Irregular heart beat   . Seizure ([place])    2 years ago knocked unconscious; hospitalized and diagnosed with seizures; no spells in last year;   . Stroke [facility])    1 month ago (possible stroke)  . Unspecified atrial fibrillation (HCC)     Medications:  Scheduled:  . diltiazem  240 mg Oral Daily  . diltiazem  5 mg Intravenous Once  . metoprolol tartrate  25 mg Oral Daily  . rivaroxaban  20 mg Oral Q supper  . sodium chloride flush  3 mL Intravenous Q12H  . sodium chloride flush  3 mL  Intravenous Q12H   Infusions:  . sodium chloride 75 mL/hr at 03/13/19 1832    Assessment: Patient arrives to ED for aphasia w/ h/o DVT and afib anticoagulated w/ xarelto 20 mg daily PTA (last dose unknown) per note patient was suppose to "start again tonight."-was off d/t had surgery 3 days prior and Xarelto on hold?  CT head negative. EKG pending. Patient is being started on heparin drip for anticoagulation of afib. 5/21- Will resume Xarelto and d/c Heparin drip  Goal of Therapy:  Monitor platelets by anticoagulation protocol: Yes   Plan:  Continue Xarelto 20 mg PO Daily  Lew Prout A 03/14/2019,12:07 PM

## 2019-03-14 NOTE — Plan of Care (Signed)
  Problem: Clinical Measurements: Goal: Will remain free from infection Outcome: Progressing   Problem: Activity: Goal: Risk for activity intolerance will decrease Outcome: Progressing   Problem: Safety: Goal: Ability to remain free from injury will improve Outcome: Progressing   Problem: Education: Goal: Knowledge of secondary prevention will improve Outcome: Progressing   Problem: Self-Care: Goal: Ability to communicate needs accurately will improve Outcome: Progressing

## 2019-03-14 NOTE — Progress Notes (Signed)
[facility] [place] NAME: [name] [name]    MR#:  [id number]  DATE OF BIRTH:  [birth year]  SUBJECTIVE:   Patient is feeling better but still repeating words, having hard time to recall few words just had lumbar laminectomy on 03/10/2019 Just worked with PT REVIEW OF SYSTEMS:  CONSTITUTIONAL: No fever, fatigue or weakness.  EYES: No blurred or double vision.  EARS, NOSE, AND THROAT: No tinnitus or ear pain.  RESPIRATORY: No cough, shortness of breath, wheezing or hemoptysis.  CARDIOVASCULAR: No chest pain, orthopnea, edema.  GASTROINTESTINAL: No nausea, vomiting, diarrhea or abdominal pain.  GENITOURINARY: No dysuria, hematuria.  ENDOCRINE: No polyuria, nocturia,  HEMATOLOGY: No anemia, easy bruising or bleeding SKIN: No rash or lesion. MUSCULOSKELETAL: No joint pain or arthritis.   NEUROLOGIC: No tingling, numbness, weakness.  PSYCHIATRY: No anxiety or depression.   DRUG ALLERGIES:   Allergies  Allergen Reactions  . Aspirin Other (See Comments)    On xarelto    VITALS:  Blood pressure (!) 156/85, pulse 93, temperature 98.2 F (36.8 C), temperature source Oral, resp. rate 16, height '5\' 2"'  (1.575 m), weight 69.9 kg, SpO2 97 %.  PHYSICAL EXAMINATION:  GENERAL:  78 y.o.-year-old patient lying in the bed with no acute distress.  EYES: Pupils equal, round, reactive to light and accommodation. No scleral icterus. Extraocular muscles intact.  HEENT: Head atraumatic, normocephalic. Oropharynx and nasopharynx clear.  NECK:  Supple, no jugular venous distention. No thyroid enlargement, no tenderness.  LUNGS: Normal breath sounds bilaterally, no wheezing, rales,rhonchi or crepitation. No use of accessory muscles of respiration.  CARDIOVASCULAR: S1, S2 normal. No murmurs, rubs, or gallops.  ABDOMEN: Soft, nontender, nondistended. Bowel sounds present.  EXTREMITIES: No pedal edema, cyanosis, or clubbing.  NEUROLOGIC: Awake, alert and  oriented x2, echolalia, motor grossly intact at her baseline, sensation intact. Gait not checked.  PSYCHIATRIC: The patient is alert and oriented x 2  SKIN: No obvious rash, lesion, or ulcer.    LABORATORY PANEL:   CBC Recent Labs  Lab 03/14/19 0544  WBC 7.5  HGB 13.1  HCT 38.6  PLT 152   ------------------------------------------------------------------------------------------------------------------  Chemistries  Recent Labs  Lab 03/11/19 1944 03/12/19 0104 03/14/19 0544  NA 138 140  --   K 3.8 3.8  --   CL 102 108  --   CO2 26 24  --   GLUCOSE 131* 132*  --   BUN 16 16  --   CREATININE 0.77 0.60 0.43*  CALCIUM 10.3 9.3  --   AST 26  --   --   ALT 16  --   --   ALKPHOS 45  --   --   BILITOT 1.2  --   --    ------------------------------------------------------------------------------------------------------------------  Cardiac Enzymes No results for input(s): TROPONINI in the last 168 hours. ------------------------------------------------------------------------------------------------------------------  RADIOLOGY:  Ct Angio Neck W Or Wo Contrast  Result Date: 03/13/2019 CLINICAL DATA:  History of stroke.  Abnormal carotid Doppler EXAM: CT ANGIOGRAPHY NECK TECHNIQUE: Multidetector CT imaging of the neck was performed using the standard protocol during bolus administration of intravenous contrast. Multiplanar CT image reconstructions and MIPs were obtained to evaluate the vascular anatomy. Carotid stenosis measurements (when applicable) are obtained utilizing NASCET criteria, using the distal internal carotid diameter as the denominator. CONTRAST:  56m OMNIPAQUE IOHEXOL 350 MG/ML SOLN COMPARISON:  Carotid Doppler 03/12/2019 FINDINGS: Aortic arch: Aberrant right subclavian artery passing posterior to the esophagus. Negative for aortic aneurysm or  dissection. Proximal great vessels widely patent. Right carotid system: Right carotid bifurcation widely patent with  minimal atherosclerotic calcification Left carotid system: Left carotid bifurcation widely patent with minimal atherosclerotic calcification Vertebral arteries: Both vertebral arteries widely patent without significant stenosis. Mild atherosclerotic calcification at the level of the skull base. Skeleton: Cervical spondylosis.  No acute skeletal abnormality. Other neck: Negative Upper chest: Lung apices clear bilaterally. IMPRESSION: No significant carotid or vertebral stenosis in the neck. Minimal atherosclerotic disease. Aberrant right subclavian artery. Electronically Signed   By: Franchot Gallo M.D.   On: 03/13/2019 10:50   US Carotid Bilateral (at [facility] And [facility] Only)  Result Date: 03/12/2019 CLINICAL DATA:  Stroke.  History of hypertension. EXAM: BILATERAL CAROTID DUPLEX ULTRASOUND TECHNIQUE: Pearline Cables scale imaging, color Doppler and duplex ultrasound were performed of bilateral carotid and vertebral arteries in the neck. COMPARISON:  None. FINDINGS: Criteria: Quantification of carotid stenosis is based on velocity parameters that correlate the residual internal carotid diameter with NASCET-based stenosis levels, using the diameter of the distal internal carotid lumen as the denominator for stenosis measurement. The following velocity measurements were obtained: RIGHT ICA: 138/15 cm/sec CCA: 46/56 cm/sec SYSTOLIC ICA/CCA RATIO:  1.6 ECA: 92 cm/sec LEFT ICA: 111/16 cm/sec CCA: 812/7 cm/sec SYSTOLIC ICA/CCA RATIO:  1.1 ECA: 103 cm/sec RIGHT CAROTID ARTERY: There is a minimal to moderate amount of eccentric echogenic plaque within the right carotid bulb extending to involve the origin and proximal aspects of the right internal carotid artery (images 15 and 22), which results in mildly elevated peak systolic velocities within the mid aspect of the right internal carotid artery. Greatest acquired peak systolic velocity within mid right ICA measures 138 centimeters/second (image 27). RIGHT VERTEBRAL ARTERY:  Antegrade  Flow LEFT CAROTID ARTERY: There is a minimal amount of eccentric echogenic plaque involving the origin and proximal aspects of the left internal carotid artery (image 54), not resulting in elevated peak systolic velocities within the interrogated course of the left internal carotid artery to suggest a hemodynamically significant stenosis. LEFT VERTEBRAL ARTERY:  Antegrade flow IMPRESSION: 1. Minimal to moderate amount of right-sided atherosclerotic plaque results in mildly elevated peak systolic velocities with the right internal carotid artery compatible with the lower end of the 50-69% luminal narrowing range. Further evaluation with CTA could performed as clinically indicated. 2. Minimal amount of left-sided atherosclerotic plaque, not resulting in a hemodynamically significant stenosis. Electronically Signed   By: Sandi Mariscal M.D.   On: 03/12/2019 16:06    EKG:   Orders placed or performed in visit on 02/15/17  . EKG 12-Lead    ASSESSMENT AND PLAN:   #Sepsis-unclear source Patient met septic criteria at the time of admission with fever and tachycardia--resolved Blood cultures are negative, chest x-ray negative Urine culture negative COVID negative  respiratory panel negative D/c IV abxs as no source identified and pt afebrile with normal WBC Lactic acid  1.1-2.3--0.7  procalcitonin less than 0.10  #Acute/subacute stroke left lateral temporal lobe with  previous history of strokes -now resumed Xarelto -Patient has passed bedside swallow evaluation -MRI of the brain with acute/subacute left lateral temporal lobe infarct -Carotid Dopplers abnormal and  CT angiogram of the neck--no significant stenosis - echocardiogram left ventricle has normal systolic function, with an ejection fraction of 55-60%. The cavity size was normal. Left ventricular diastolic Doppler parameters are consistent with impaired relaxation -Physical therapy is recommending CIR LDL 67 Neurology consult placed, Dr.  Doy Mince is following the patient  #Atrial fibrillation with RVR Patient  home medications Xarelto resumed and heparin drip discontinued  #History of lumbar degenerative disc disease status post lumbar laminectomy on 03/10/2019 Consult placed to Dr. Mack Guise, he has discussed with  the spine surgeon Recommending outpatient follow-up and resuming Xarelto  #Hypertension Allow permissive hypertension initially will titrate blood pressure medicines for better control as needed.  Currently blood pressure stable  D/w dter [name] [name]. Awaiting d/c disposition CIR vs SNF  All the records are reviewed and case discussed with Care Management/Social Workerr. Management plans discussed with the patient at bedside and daughter [name] [name] over [phone] and they are in agreement.  CODE STATUS: DO NOT RESUSCITATE, daughter is the healthcare POA  TOTAL TIME TAKING CARE OF THIS PATIENT: 35 minutes.   POSSIBLE D/C IN 1-2 DAYS, DEPENDING ON CLINICAL CONDITION.  Note: This dictation was prepared with Dragon dictation along with smaller phrase technology. Any transcriptional errors that result from this process are unintentional.   Fritzi Mandes M.D on 03/14/2019 at 12:46 PM  Between 7am to 6pm - Pager - [phone] After 6pm go to [email or web address] - password [email or web address] [facility]  Office  [phone]  CC: Primary care physician; Birdie Sons, MD

## 2019-03-14 NOTE — Progress Notes (Signed)
Physical Therapy Treatment Patient Details Name: [name] [name] MRN: [id number] DOB: 29-Dec-1940 Today's Date: 03/14/2019    History of Present Illness Pt is 78 yo female admitted for sepsis, AMS, recent back surgery 5/18 per chart (laminectomy), work up showed MRI + for acute infarct, Reduced diffusion within the left lateral temporal lobe extending into left posterior insula compatible with acute/early subacute infarction. PMH of afib, DVT, heart murmur, seizure, possible stroke    PT Comments    Pt agreeable to PT session and requesting to walk to bathroom to have bowel movement.  Word finding difficulty noted.  Pt initially standing to L side of walker requiring cueing for improved positioning and pt requiring consistent cueing to stay closer to RW.  Did well tolerated ambulating 20 feet x2 with RW (CGA to min assist x1).  HR fluctuating between 62-78 bpm during session's activities.  Will continue to focus on strengthening, balance, and progressive functional mobility training.    Follow Up Recommendations  CIR     Equipment Recommendations  Rolling walker with 5" wheels    Recommendations for Other Services OT consult;Speech consult     Precautions / Restrictions Precautions Precautions: Fall Precaution Comments: watch HR Restrictions Weight Bearing Restrictions: No    Mobility  Bed Mobility Overal bed mobility: Needs Assistance Bed Mobility: Supine to Sit     Supine to sit: Supervision     General bed mobility comments: increased time to perform on own, use of bed rail; occasional vc's for technique  Transfers Overall transfer level: Needs assistance Equipment used: Rolling walker (2 wheeled) Transfers: Sit to/from Stand Sit to Stand: Min guard         General transfer comment: x1 from bed and x1 from toilet; occasional vc's for technique  Ambulation/Gait Ambulation/Gait assistance: Min guard;Min assist Gait Distance (Feet): (20 feet x2) Assistive  device: Rolling walker (2 wheeled)   Gait velocity: decreased   General Gait Details: narrow BOS; pt initially standing to L side of walker requiring vc's and tactile cues for positioning; consistent cueing to stay closer to RW required; decreased B LE step length/foot clearance/heelstrike; intermittent assist (min) for walker management and for safety   Stairs             Wheelchair Mobility    Modified Rankin (Stroke Patients Only)       Balance Overall balance assessment: Needs assistance Sitting-balance support: No upper extremity supported;Feet supported Sitting balance-Leahy Scale: Good Sitting balance - Comments: steady sitting reaching within BOS     Standing balance-Leahy Scale: Fair Standing balance comment: CGA for safety (d/t mild sway) standing washing hands                            Cognition Arousal/Alertness: Awake/alert Behavior During Therapy: WFL for tasks assessed/performed Overall Cognitive Status: Difficult to assess                                 General Comments: word finding difficulties noted      Exercises      General Comments  Pt agreeable to PT session.      Pertinent Vitals/Pain Pain Assessment: No/denies pain Pain Score: 0-No pain Pain Intervention(s): Limited activity within patient's tolerance;Monitored during session;Repositioned    Home Living  Prior Function            PT Goals (current goals can now be found in the care plan section) Acute Rehab PT Goals Patient Stated Goal: to improve mobility PT Goal Formulation: With patient Time For Goal Achievement: 03/28/19 Potential to Achieve Goals: Good Progress towards PT goals: Progressing toward goals    Frequency    7X/week      PT Plan Current plan remains appropriate    Co-evaluation              AM-PAC PT "6 Clicks" Mobility   Outcome Measure  Help needed turning from your back to your  side while in a flat bed without using bedrails?: None Help needed moving from lying on your back to sitting on the side of a flat bed without using bedrails?: A Little Help needed moving to and from a bed to a chair (including a wheelchair)?: A Little Help needed standing up from a chair using your arms (e.g., wheelchair or bedside chair)?: A Little Help needed to walk in hospital room?: A Little Help needed climbing 3-5 steps with a railing? : A Lot 6 Click Score: 18    End of Session Equipment Utilized During Treatment: Gait belt Activity Tolerance: Patient tolerated treatment well Patient left: in chair;with call bell/phone within reach;with chair alarm set;Other (comment)(MD present) Nurse Communication: Mobility status;Precautions;Other (comment)(NT notified pt had bowel movement) PT Visit Diagnosis: Muscle weakness (generalized) (M62.81);Difficulty in walking, not elsewhere classified (R26.2);Other symptoms and signs involving the nervous system (R29.898);Other abnormalities of gait and mobility (R26.89)     Time: 5701-7793 PT Time Calculation (min) (ACUTE ONLY): 25 min  Charges:  $Gait Training: 8-22 mins $Therapeutic Activity: 8-22 mins                    Leitha Bleak, PT 03/14/19, 12:15 PM [phone]

## 2019-03-14 NOTE — TOC Progression Note (Addendum)
Transition of Care [facility]) - Progression Note    Patient Details  Name: [name] [name] MRN: [id number] Date of Birth: [birth year]  [facility] of [facility]) CM/SW Contact  Ross Ludwig, [place] Phone Number: 03/14/2019, 6:32 PM  Clinical Narrative:     CSW spoke with CIR admission worker Urban Gibson, she said they are reviewing patient's information, and insurance company.  CIR does not have a bed over the weekend, but they may on Monday, CSW continuing to follow patient's progress throughout discharge planning.    Expected Discharge Plan: [place] Barriers to Discharge: Continued Medical Work up  Expected Discharge Plan and Services Expected Discharge Plan: [place] Choice: [place] arrangements for the past 2 months: Apartment                                       Social Determinants of Health (SDOH) Interventions    Readmission Risk Interventions Readmission Risk Prevention Plan 03/12/2019  Transportation Screening Complete  Some recent data might be hidden

## 2019-03-15 NOTE — Plan of Care (Signed)
  Problem: Education: Goal: Knowledge of General Education information will improve Description Including pain rating scale, medication(s)/side effects and non-pharmacologic comfort measures 03/15/2019 1846 by Chriss Czar, Illene Bolus, RN Outcome: Progressing 03/15/2019 1807 by Chriss Czar, Illene Bolus, RN Outcome: Progressing   Problem: Health Behavior/Discharge Planning: Goal: Ability to manage health-related needs will improve 03/15/2019 1846 by Feliberto Gottron, RN Outcome: Progressing 03/15/2019 1807 by Feliberto Gottron, RN Outcome: Progressing   Problem: Clinical Measurements: Goal: Respiratory complications will improve Outcome: Progressing   Problem: Activity: Goal: Risk for activity intolerance will decrease 03/15/2019 1846 by Chriss Czar, Illene Bolus, RN Outcome: Progressing Note:  Patient ambulated to the bathroom with one assist using walker.  03/15/2019 1807 by Feliberto Gottron, RN Outcome: Progressing   Problem: Education: Goal: Knowledge of disease or condition will improve Outcome: Progressing   Problem: Education: Goal: Knowledge of disease or condition will improve Outcome: Progressing

## 2019-03-15 NOTE — Progress Notes (Signed)
Physical Therapy Treatment Patient Details Name: [name] [name] MRN: [id number] DOB: Sep 07, 1941 Today's Date: 03/15/2019    History of Present Illness Pt is 78 yo female admitted for sepsis, AMS, recent back surgery 5/18 per chart (laminectomy), work up showed MRI + for acute infarct, Reduced diffusion within the left lateral temporal lobe extending into left posterior insula compatible with acute/early subacute infarction. PMH of afib, DVT, heart murmur, seizure, possible stroke    PT Comments    Pt ready to get out of bed this am.  To edge with supervision.  Stood with min guard and was able to walk to bathroom with walker.  Pt needed verbal cues for general safety during mobility.  She was able to walk in hallway with continued imbalances that require +1 assist at all times.  After seated rest she was able to stand for exercises with walker support and in hallway for sidestepping and forward/backwards gait along rail which she stated caused her LBP.  Pt is aware of balance deficits and safety concerns.  Despite improving mobility, pt remains a good candidate for CIR to return to baseline.  Continued with word finding difficulties.   Follow Up Recommendations  CIR     Equipment Recommendations  Rolling walker with 5" wheels    Recommendations for Other Services       Precautions / Restrictions Precautions Precautions: Fall Precaution Comments: watch HR Restrictions Weight Bearing Restrictions: No    Mobility  Bed Mobility Overal bed mobility: Needs Assistance Bed Mobility: Supine to Sit Rolling: Modified independent (Device/Increase time)   Supine to sit: Supervision        Transfers Overall transfer level: Needs assistance Equipment used: Rolling walker (2 wheeled) Transfers: Sit to/from Stand Sit to Stand: Min guard         General transfer comment: x1 from bed and x1 from toilet; occasional vc's for technique  Ambulation/Gait Ambulation/Gait assistance:  Min guard;Min assist Gait Distance (Feet): 100 Feet Assistive device: Rolling walker (2 wheeled) Gait Pattern/deviations: Step-to pattern;Step-through pattern Gait velocity: decreased   General Gait Details: irregular gait pattern and imbalances at times   Stairs             Wheelchair Mobility    Modified Rankin (Stroke Patients Only)       Balance Overall balance assessment: Needs assistance Sitting-balance support: No upper extremity supported;Feet supported Sitting balance-Leahy Scale: Good     Standing balance support: Single extremity supported Standing balance-Leahy Scale: Poor Standing balance comment: CGA for safety (d/t mild sway) standing washing hands                            Cognition Arousal/Alertness: Awake/alert Behavior During Therapy: WFL for tasks assessed/performed Overall Cognitive Status: Difficult to assess                                 General Comments: word finding difficulties noted      Exercises Other Exercises Other Exercises: standing exercised with RW - heel raises, SLR, and marches Other Exercises: sidestepping, backwards and attempts at tandem gait with rail in hallway    General Comments        Pertinent Vitals/Pain Pain Assessment: Faces Faces Pain Scale: Hurts little more Pain Location: back pain increased with mobility Pain Descriptors / Indicators: Discomfort;Grimacing Pain Intervention(s): Limited activity within patient's tolerance;Monitored during session;Repositioned    Home Living  Prior Function            PT Goals (current goals can now be found in the care plan section) Progress towards PT goals: Progressing toward goals    Frequency    7X/week      PT Plan Current plan remains appropriate    Co-evaluation              AM-PAC PT "6 Clicks" Mobility   Outcome Measure  Help needed turning from your back to your side while in a  flat bed without using bedrails?: None Help needed moving from lying on your back to sitting on the side of a flat bed without using bedrails?: A Little Help needed moving to and from a bed to a chair (including a wheelchair)?: A Little Help needed standing up from a chair using your arms (e.g., wheelchair or bedside chair)?: A Little Help needed to walk in hospital room?: A Little Help needed climbing 3-5 steps with a railing? : A Lot 6 Click Score: 18    End of Session Equipment Utilized During Treatment: Gait belt Activity Tolerance: Patient tolerated treatment well Patient left: in chair;with call bell/phone within reach;with chair alarm set;Other (comment)         Time: 0511-0211 PT Time Calculation (min) (ACUTE ONLY): 23 min  Charges:  $Gait Training: 8-22 mins $Therapeutic Exercise: 8-22 mins                    Chesley Noon, PTA 03/15/19, 1:24 PM

## 2019-03-15 NOTE — Progress Notes (Signed)
[facility] [place] NAME: [name] [name]    MR#:  [id number]  DATE OF BIRTH:  1941-09-01  SUBJECTIVE:   Patient is feeling better but still repeating words, having hard time to recall few words No issues per RN REVIEW OF SYSTEMS:  CONSTITUTIONAL: No fever, fatigue or weakness.  EYES: No blurred or double vision.  EARS, NOSE, AND THROAT: No tinnitus or ear pain.  RESPIRATORY: No cough, shortness of breath, wheezing or hemoptysis.  CARDIOVASCULAR: No chest pain, orthopnea, edema.  GASTROINTESTINAL: No nausea, vomiting, diarrhea or abdominal pain.  GENITOURINARY: No dysuria, hematuria.  ENDOCRINE: No polyuria, nocturia,  HEMATOLOGY: No anemia, easy bruising or bleeding SKIN: No rash or lesion. MUSCULOSKELETAL: No joint pain or arthritis.   NEUROLOGIC: No tingling, numbness, weakness.  PSYCHIATRY: No anxiety or depression.   DRUG ALLERGIES:   Allergies  Allergen Reactions  . Aspirin Other (See Comments)    On xarelto    VITALS:  Blood pressure 136/83, pulse 86, temperature 98.1 F (36.7 C), temperature source Oral, resp. rate 18, height '5\' 2"'  (1.575 m), weight 69.9 kg, SpO2 97 %.  PHYSICAL EXAMINATION:  GENERAL:  78 y.o.-year-old patient lying in the bed with no acute distress.  EYES: Pupils equal, round, reactive to light and accommodation. No scleral icterus. Extraocular muscles intact.  HEENT: Head atraumatic, normocephalic. Oropharynx and nasopharynx clear.  NECK:  Supple, no jugular venous distention. No thyroid enlargement, no tenderness.  LUNGS: Normal breath sounds bilaterally, no wheezing, rales,rhonchi or crepitation. No use of accessory muscles of respiration.  CARDIOVASCULAR: S1, S2 normal. No murmurs, rubs, or gallops.  ABDOMEN: Soft, nontender, nondistended. Bowel sounds present.  EXTREMITIES: No pedal edema, cyanosis, or clubbing.  NEUROLOGIC: Awake, alert and oriented x2, echolalia, motor grossly intact at  her baseline, sensation intact. Gait not checked.  PSYCHIATRIC: The patient is alert and oriented x 2  SKIN: No obvious rash, lesion, or ulcer.    LABORATORY PANEL:   CBC Recent Labs  Lab 03/14/19 0544  WBC 7.5  HGB 13.1  HCT 38.6  PLT 152   ------------------------------------------------------------------------------------------------------------------  Chemistries  Recent Labs  Lab 03/11/19 1944 03/12/19 0104 03/14/19 0544  NA 138 140  --   K 3.8 3.8  --   CL 102 108  --   CO2 26 24  --   GLUCOSE 131* 132*  --   BUN 16 16  --   CREATININE 0.77 0.60 0.43*  CALCIUM 10.3 9.3  --   AST 26  --   --   ALT 16  --   --   ALKPHOS 45  --   --   BILITOT 1.2  --   --    ------------------------------------------------------------------------------------------------------------------  Cardiac Enzymes No results for input(s): TROPONINI in the last 168 hours. ------------------------------------------------------------------------------------------------------------------  RADIOLOGY:  Ct Angio Neck W Or Wo Contrast  Result Date: 03/13/2019 CLINICAL DATA:  History of stroke.  Abnormal carotid Doppler EXAM: CT ANGIOGRAPHY NECK TECHNIQUE: Multidetector CT imaging of the neck was performed using the standard protocol during bolus administration of intravenous contrast. Multiplanar CT image reconstructions and MIPs were obtained to evaluate the vascular anatomy. Carotid stenosis measurements (when applicable) are obtained utilizing NASCET criteria, using the distal internal carotid diameter as the denominator. CONTRAST:  43m OMNIPAQUE IOHEXOL 350 MG/ML SOLN COMPARISON:  Carotid Doppler 03/12/2019 FINDINGS: Aortic arch: Aberrant right subclavian artery passing posterior to the esophagus. Negative for aortic aneurysm or dissection. Proximal great vessels widely patent. Right  carotid system: Right carotid bifurcation widely patent with minimal atherosclerotic calcification Left carotid  system: Left carotid bifurcation widely patent with minimal atherosclerotic calcification Vertebral arteries: Both vertebral arteries widely patent without significant stenosis. Mild atherosclerotic calcification at the level of the skull base. Skeleton: Cervical spondylosis.  No acute skeletal abnormality. Other neck: Negative Upper chest: Lung apices clear bilaterally. IMPRESSION: No significant carotid or vertebral stenosis in the neck. Minimal atherosclerotic disease. Aberrant right subclavian artery. Electronically Signed   By: Franchot Gallo M.D.   On: 03/13/2019 10:50    EKG:   Orders placed or performed in visit on 02/15/17  . EKG 12-Lead    ASSESSMENT AND PLAN:   #Sepsis-unclear source Patient met septic criteria at the time of admission with fever and tachycardia--resolved Blood cultures are negative, chest x-ray negative Urine culture negative COVID negative  respiratory panel negative D/c IV abxs as no source identified and pt afebrile with normal WBC Lactic acid  1.1-2.3--0.7  procalcitonin less than 0.10  #Acute/subacute stroke left lateral temporal lobe with  previous history of strokes -now resumed Xarelto -Patient has passed bedside swallow evaluation -MRI of the brain with acute/subacute left lateral temporal lobe infarct -Carotid Dopplers abnormal and  CT angiogram of the neck--no significant stenosis - echocardiogram left ventricle has normal systolic function, with an ejection fraction of 55-60%. The cavity size was normal. Left ventricular diastolic Doppler parameters are consistent with impaired relaxation -Physical therapy is recommending CIR LDL 67 Neurology consult placed, Dr. Doy Mince is following the patient  #Atrial fibrillation with RVR Patient home medications Xarelto resumed and heparin drip discontinued  #History of lumbar degenerative disc disease status post lumbar laminectomy on 03/10/2019 Consult placed to Dr. Mack Guise, he has discussed with   the spine surgeon Recommending outpatient follow-up and resuming Xarelto  #Hypertension Allow permissive hypertension initially will titrate blood pressure medicines for better control as needed.  Currently blood pressure stable  D/w dter [name]. Awaiting d/c disposition CIR hoping bed becomes available.  All the records are reviewed and case discussed with Care Management/Social Workerr. Management plans discussed with the patient at bedside and daughter [name] over [phone] and they are in agreement.  CODE STATUS: DO NOT RESUSCITATE, daughter is the healthcare POA  TOTAL TIME TAKING CARE OF THIS PATIENT: 25 minutes.   POSSIBLE D/C IN 1-2 DAYS, DEPENDING ON CLINICAL CONDITION.  Note: This dictation was prepared with Dragon dictation along with smaller phrase technology. Any transcriptional errors that result from this process are unintentional.   Fritzi Mandes M.D on 03/15/2019 at 8:28 AM  Between 7am to 6pm - Pager - [phone] After 6pm go to [email or web address] - password [email or web address] [place] [facility]  Office  [phone]  CC: Primary care physician; Birdie Sons, MD

## 2019-03-15 NOTE — Progress Notes (Signed)
Occupational Therapy Treatment Patient Details Name: [name] [name] MRN: [id number] DOB: 22-May-1941 Today's Date: 03/15/2019    History of present illness Pt is 78 yo female admitted for sepsis, AMS, recent back surgery 5/18 per chart (laminectomy), work up showed MRI + for acute infarct, Reduced diffusion within the left lateral temporal lobe extending into left posterior insula compatible with acute/early subacute infarction. PMH of afib, DVT, heart murmur, seizure, possible stroke   OT comments  Pt seen for OT tx this date. Pt progressing well towards goals. Noted improvement in speech difficulties and pt agrees, however, continues to demonstrate some word finding difficulties when attempting to multi task and when fatigues near end of session. Pt eager to work with therapy to use bathroom, take a sponge bath, and brush her teeth so she could "feel like a normal person." Pt ambulates to the bathroom with RW and CGA with occasional cues for safety as pt somewhat unsteady. Pt completed a toilet t/f with CGA, cues for hand placement and problem solving, and for STS pericare. Pt washed her hands at the sink with no UE support remaining inside her RW and with CGA, no significant LOB noted. Pt completed seated sponge bath with set up and CGA when leaning side to side to wash her perineal area. Cues for safety to use fresh washcloth to wash her face. Pt demonstrating mild problem solving and safety awareness deficits during functional mobility and ADL tasks, requiring occasional cues and supervision to CGA to maximize safety. Given pt's progress, revising recommendation to CIR to maximize pt's return to PLOF with a short period of time. Pt motivated and demonstrating progress with each session.    Follow Up Recommendations  CIR    Equipment Recommendations  3 in 1 bedside commode    Recommendations for Other Services      Precautions / Restrictions Precautions Precautions: Fall Precaution  Comments: watch HR Restrictions Weight Bearing Restrictions: No       Mobility Bed Mobility Overal bed mobility: Needs Assistance Bed Mobility: Supine to Sit;Sit to Supine Rolling: Modified independent (Device/Increase time)   Supine to sit: Supervision Sit to supine: Supervision      Transfers Overall transfer level: Needs assistance Equipment used: Rolling walker (2 wheeled) Transfers: Sit to/from Stand Sit to Stand: Min guard         General transfer comment: x1 from bed and x1 from toilet; occasional vc's for technique    Balance Overall balance assessment: Needs assistance Sitting-balance support: No upper extremity supported;Feet supported Sitting balance-Leahy Scale: Good     Standing balance support: No upper extremity supported;During functional activity Standing balance-Leahy Scale: Fair Standing balance comment: CGA but able to stand inside RW at sink to wash hands without LOB                           ADL either performed or assessed with clinical judgement   ADL Overall ADL's : Needs assistance/impaired     Grooming: Sitting;Set up   Upper Body Bathing: Sitting;Set up;Supervision/ safety   Lower Body Bathing: Sit to/from stand;Minimal assistance   Upper Body Dressing : Sitting;Supervision/safety;Set up   Lower Body Dressing: Sit to/from stand;Minimal assistance   Toilet Transfer: Regular Toilet;RW;Ambulation;Min guard   Toileting- Clothing Manipulation and Hygiene: Sit to/from stand;Supervision/safety               Vision Patient Visual Report: No change from baseline     Perception  Praxis      Cognition Arousal/Alertness: Awake/alert Behavior During Therapy: WFL for tasks assessed/performed Overall Cognitive Status: Difficult to assess                                 General Comments: word finding difficulties noted but appears improved from previous session and pt endorses improvement as well         Exercises Other Exercises  Other Exercises: pt took seated EOB sponge bath with setup and supervision, able to lean side to side for pericare, pt tolerated well, requiring Min A for LB but otherwise able to do herself   Shoulder Instructions       General Comments      Pertinent Vitals/ Pain       Pain Assessment: No/denies pain Faces Pain Scale: Hurts little more Pain Location: back pain increased with mobility Pain Descriptors / Indicators: Discomfort;Grimacing Pain Intervention(s): Limited activity within patient's tolerance;Monitored during session;Repositioned  Home Living                                          Prior Functioning/Environment              Frequency  Min 3X/week        Progress Toward Goals  OT Goals(current goals can now be found in the care plan section)  Progress towards OT goals: Progressing toward goals  Acute Rehab OT Goals Patient Stated Goal: to improve mobility OT Goal Formulation: With patient Time For Goal Achievement: 03/26/19 Potential to Achieve Goals: Good  Plan Discharge plan needs to be updated;Frequency needs to be updated    Co-evaluation                 AM-PAC OT "6 Clicks" Daily Activity     Outcome Measure   Help from another person eating meals?: None Help from another person taking care of personal grooming?: None Help from another person toileting, which includes using toliet, bedpan, or urinal?: A Little Help from another person bathing (including washing, rinsing, drying)?: A Little Help from another person to put on and taking off regular upper body clothing?: A Little Help from another person to put on and taking off regular lower body clothing?: A Little 6 Click Score: 20    End of Session Equipment Utilized During Treatment: Gait belt;Rolling walker  OT Visit Diagnosis: Other abnormalities of gait and mobility (R26.89);Muscle weakness (generalized) (M62.81);Cognitive  communication deficit (R41.841) Symptoms and signs involving cognitive functions: Cerebral infarction   Activity Tolerance Patient tolerated treatment well   Patient Left in bed;with call bell/phone within reach;with bed alarm set   Nurse Communication          Time: 6301-6010 OT Time Calculation (min): 38 min  Charges: OT General Charges $OT Visit: 1 Visit OT Treatments $Self Care/Home Management : 38-52 mins  Jeni Salles, MPH, MS, OTR/L ascom [phone] 03/15/19, 3:48 PM

## 2019-03-15 NOTE — Plan of Care (Signed)
  Problem: Education: Goal: Knowledge of General Education information will improve Description Including pain rating scale, medication(s)/side effects and non-pharmacologic comfort measures Outcome: Progressing   Problem: Health Behavior/Discharge Planning: Goal: Ability to manage health-related needs will improve Outcome: Progressing   Problem: Activity: Goal: Risk for activity intolerance will decrease Outcome: Progressing   Problem: Education: Goal: Knowledge of disease or condition will improve Outcome: Progressing

## 2019-03-16 LAB — CULTURE, BLOOD (ROUTINE X 2)
Culture: NO GROWTH
Culture: NO GROWTH
Special Requests: ADEQUATE
Special Requests: ADEQUATE

## 2019-03-16 NOTE — Plan of Care (Signed)
  Problem: Education: Goal: Knowledge of General Education information will improve Description Including pain rating scale, medication(s)/side effects and non-pharmacologic comfort measures Outcome: Progressing   Problem: Clinical Measurements: Goal: Diagnostic test results will improve Outcome: Progressing   Problem: Education: Goal: Knowledge of disease or condition will improve Outcome: Progressing   Problem: Self-Care: Goal: Ability to participate in self-care as condition permits will improve Outcome: Progressing Goal: Verbalization of feelings and concerns over difficulty with self-care will improve Outcome: Progressing Goal: Ability to communicate needs accurately will improve Outcome: Progressing

## 2019-03-16 NOTE — Progress Notes (Signed)
[facility] at [place] NAME: [name] [name]    MR#:  [id number]  DATE OF BIRTH:  April 08, 1941  SUBJECTIVE:   Patient is feeling better but still repeating words, having hard time to recall few words No issues per RN Working well with PT REVIEW OF SYSTEMS:  CONSTITUTIONAL: No fever, fatigue or weakness.  EYES: No blurred or double vision.  EARS, NOSE, AND THROAT: No tinnitus or ear pain.  RESPIRATORY: No cough, shortness of breath, wheezing or hemoptysis.  CARDIOVASCULAR: No chest pain, orthopnea, edema.  GASTROINTESTINAL: No nausea, vomiting, diarrhea or abdominal pain.  GENITOURINARY: No dysuria, hematuria.  ENDOCRINE: No polyuria, nocturia,  HEMATOLOGY: No anemia, easy bruising or bleeding SKIN: No rash or lesion. MUSCULOSKELETAL: No joint pain or arthritis.   NEUROLOGIC: No tingling, numbness, weakness.  PSYCHIATRY: No anxiety or depression.   DRUG ALLERGIES:   Allergies  Allergen Reactions  . Aspirin Other (See Comments)    On xarelto    VITALS:  Blood pressure (!) 147/85, pulse 87, temperature 99 F (37.2 C), temperature source Oral, resp. rate 16, height 5' 2" (1.575 m), weight 68.3 kg, SpO2 94 %.  PHYSICAL EXAMINATION:  GENERAL:  78 y.o.-year-old patient lying in the bed with no acute distress.  EYES: Pupils equal, round, reactive to light and accommodation. No scleral icterus. Extraocular muscles intact.  HEENT: Head atraumatic, normocephalic. Oropharynx and nasopharynx clear.  NECK:  Supple, no jugular venous distention. No thyroid enlargement, no tenderness.  LUNGS: Normal breath sounds bilaterally, no wheezing, rales,rhonchi or crepitation. No use of accessory muscles of respiration.  CARDIOVASCULAR: S1, S2 normal. No murmurs, rubs, or gallops.  ABDOMEN: Soft, nontender, nondistended. Bowel sounds present.  EXTREMITIES: No pedal edema, cyanosis, or clubbing.  NEUROLOGIC: Awake, alert and oriented x2, echolalia,  motor grossly intact at her baseline, sensation intact. Gait not checked.  PSYCHIATRIC: The patient is alert and oriented x 2  SKIN: No obvious rash, lesion, or ulcer.    LABORATORY PANEL:   CBC Recent Labs  Lab 03/14/19 0544  WBC 7.5  HGB 13.1  HCT 38.6  PLT 152   ------------------------------------------------------------------------------------------------------------------  Chemistries  Recent Labs  Lab 03/11/19 1944 03/12/19 0104 03/14/19 0544  NA 138 140  --   K 3.8 3.8  --   CL 102 108  --   CO2 26 24  --   GLUCOSE 131* 132*  --   BUN 16 16  --   CREATININE 0.77 0.60 0.43*  CALCIUM 10.3 9.3  --   AST 26  --   --   ALT 16  --   --   ALKPHOS 45  --   --   BILITOT 1.2  --   --    ------------------------------------------------------------------------------------------------------------------  Cardiac Enzymes No results for input(s): TROPONINI in the last 168 hours. ------------------------------------------------------------------------------------------------------------------  RADIOLOGY:  No results found.  EKG:   Orders placed or performed in visit on 02/15/17  . EKG 12-Lead    ASSESSMENT AND PLAN:    #Acute/subacute stroke left lateral temporal lobe with  previous history of strokes -now resumed Xarelto -Patient has passed bedside swallow evaluation -MRI of the brain with acute/subacute left lateral temporal lobe infarct -Carotid Dopplers abnormal and  CT angiogram of the neck--no significant stenosis - echocardiogram left ventricle has normal systolic function, with an ejection fraction of 55-60%. The cavity size was normal. Left ventricular diastolic Doppler parameters are consistent with impaired relaxation -Physical therapy is recommending CIR LDL 67  Neurology consult placed, Dr. Doy Mince is following the patient  #Sepsis-on admission--resolevd. No source identified Patient met septic criteria at the time of admission with fever and  tachycardia--resolved Blood cultures are negative, chest x-ray negative Urine culture negative COVID negative  respiratory panel negative D/c IV abxs as no source identified and pt afebrile with normal WBC Lactic acid  1.1-2.3--0.7  procalcitonin less than 0.10   #Atrial fibrillation with RVR Patient home medications Xarelto resumed and heparin drip discontinued  #History of lumbar degenerative disc disease status post lumbar laminectomy on 03/10/2019 Consult placed to Dr. Mack Guise, he has discussed with  the spine surgeon Recommending outpatient follow-up and resuming Xarelto  #Hypertension Allow permissive hypertension initially will titrate blood pressure medicines for better control as needed.  Currently blood pressure stable   dter [name] aware Awaiting d/c disposition CIR hoping bed becomes available.  CODE STATUS: DO NOT RESUSCITATE, daughter is the healthcare POA  TOTAL TIME TAKING CARE OF THIS PATIENT: 25 minutes.   Discharge when rehab bed available Note: This dictation was prepared with Dragon dictation along with smaller phrase technology. Any transcriptional errors that result from this process are unintentional.   Fritzi Mandes M.D on 03/16/2019 at 1:13 PM  Between 7am to 6pm - Pager - [phone] After 6pm go to [email or web address] - password [email or web address] [facility]  Office  [phone]  CC: Primary care physician; Birdie Sons, MD

## 2019-03-16 NOTE — Progress Notes (Signed)
Physical Therapy Treatment Patient Details Name: [name] [name] MRN: [id number] DOB: 08/21/1941 Today's Date: 03/16/2019    History of Present Illness Pt is 78 yo female admitted for sepsis, AMS, recent back surgery 5/18 per chart (laminectomy), work up showed MRI + for acute infarct, Reduced diffusion within the left lateral temporal lobe extending into left posterior insula compatible with acute/early subacute infarction. PMH of afib, DVT, heart murmur, seizure, possible stroke    PT Comments    Pt in bathroom upon arrival.  Pt with difficulty performing self care in sitting and showed good safety awareness as she voiced being nervous to stand and attempt care without stand by assist.  Assist provided and while she was able to attend to her needs, min guard was needed for safety.  To sink to wash hands after care with no LOB.  Session focused on general gait safety this session.  While ambulation distance is increasing nicely each session, primary barriers to safe discharge are general safety, cognition deficits and balance.  She continues to require verbal and tactile cues for walker position as she tends to walk outside of walker box reaching too far forward for walker.  She continues to be somewhat unsteady at times and combined with poor walker position put her at increased risk for falls.  Word finding difficulties are improved but still evident in conversation.  She is unable to describe her home situation without a lot of questions and prompts.  She is generally fatigued today but agrees to sitting in chair for lunch.   HR was noted to increase to 138 towards end of gait but had returned to 100 after seated rest.   Follow Up Recommendations  CIR     Equipment Recommendations  Rolling walker with 5" wheels    Recommendations for Other Services       Precautions / Restrictions Precautions Precautions: Fall Precaution Comments: watch HR Restrictions Weight Bearing  Restrictions: No    Mobility  Bed Mobility               General bed mobility comments: in bathroom upon arrival with RN  Transfers Overall transfer level: Needs assistance Equipment used: Rolling walker (2 wheeled) Transfers: Sit to/from Stand Sit to Stand: Min guard         General transfer comment:  from toilet; occasional vc's for technique  Ambulation/Gait Ambulation/Gait assistance: Min guard;Min assist Gait Distance (Feet): 200 Feet Assistive device: Rolling walker (2 wheeled) Gait Pattern/deviations: Step-through pattern;Decreased step length - right;Decreased step length - left;Trunk flexed Gait velocity: decreased   General Gait Details: irregular gait pattern and imbalances at times but recovers with min gaurd, poor walker position with verbal cues to correct   Stairs             Wheelchair Mobility    Modified Rankin (Stroke Patients Only)       Balance Overall balance assessment: Needs assistance Sitting-balance support: No upper extremity supported;Feet supported Sitting balance-Leahy Scale: Good Sitting balance - Comments: steady sitting reaching within BOS   Standing balance support: No upper extremity supported;During functional activity Standing balance-Leahy Scale: Fair Standing balance comment: CGA but able to stand inside RW at sink to wash hands without LOB, difficult self care after bathroom use.                            Cognition Arousal/Alertness: Awake/alert Behavior During Therapy: WFL for tasks assessed/performed  General Comments: word finding difficulties noted       Exercises      General Comments        Pertinent Vitals/Pain Pain Assessment: No/denies pain    Home Living                      Prior Function            PT Goals (current goals can now be found in the care plan section) Progress towards PT goals: Progressing toward  goals    Frequency    7X/week      PT Plan Current plan remains appropriate    Co-evaluation              AM-PAC PT "6 Clicks" Mobility   Outcome Measure  Help needed turning from your back to your side while in a flat bed without using bedrails?: None Help needed moving from lying on your back to sitting on the side of a flat bed without using bedrails?: A Little Help needed moving to and from a bed to a chair (including a wheelchair)?: A Little Help needed standing up from a chair using your arms (e.g., wheelchair or bedside chair)?: A Little Help needed to walk in hospital room?: A Little Help needed climbing 3-5 steps with a railing? : A Lot 6 Click Score: 18    End of Session Equipment Utilized During Treatment: Gait belt Activity Tolerance: Patient tolerated treatment well;Patient limited by fatigue Patient left: in chair;with call bell/phone within reach;with chair alarm set         Time: 1055-1110 PT Time Calculation (min) (ACUTE ONLY): 15 min  Charges:  $Gait Training: 8-22 mins                     Chesley Noon, PTA 03/16/19, 11:19 AM

## 2019-03-17 MED ORDER — DIPHENHYDRAMINE HCL 25 MG PO CAPS
25.0000 mg | ORAL_CAPSULE | Freq: Every evening | ORAL | Status: DC | PRN
  Administered 2019-03-17: 25 mg via ORAL
  Filled 2019-03-17: qty 1

## 2019-03-17 NOTE — Care Management Important Message (Signed)
Important Message  Patient Details  Name: RAYNI NEMITZ MRN: [id number] Date of Birth: [birth year]   Medicare Important Message Given:  Yes    Dannette Barbara 03/17/2019, 11:48 AM

## 2019-03-17 NOTE — TOC Progression Note (Signed)
Transition of Care [facility]) - Progression Note    Patient Details  Name: [name] [name] MRN: [id number] Date of Birth: Oct 26, 1940  [facility] CM/SW Contact  Ross Ludwig, [place] Phone Number: 03/17/2019, 9:49 AM  Clinical Narrative:     CSW spoke with patient's daughter, and explained CSW is waiting for approval from insurance company.  CSW informed patient's daughter that if CIR is denied by insurance company the back up plan would be home with home health or SNF for short term rehab.  CSW began bed search in [facility], awaiting decision by [employer] and insurance company.  CSW continuing to follow patient's progress throughout discharge planning.  Expected Discharge Plan: [place] Barriers to Discharge: Continued Medical Work up  Expected Discharge Plan and Services Expected Discharge Plan: [place] Choice: [place] arrangements for the past 2 months: Apartment                                       Social Determinants of Health (SDOH) Interventions    Readmission Risk Interventions Readmission Risk Prevention Plan 03/12/2019  Transportation Screening Complete  Some recent data might be hidden

## 2019-03-17 NOTE — Progress Notes (Signed)
Inpatient Rehabilitation-Admissions Coordinator   After review of most recent PT note, feel pt no longer requires CIR as she is Min G/supervision for transfers and Min G for ambulation. AC will communicate with CM/SW and sign off.   Please call if questions.   Jhonnie Garner, OTR/L  Rehab Admissions Coordinator  [phone] 03/17/2019 2:07 PM

## 2019-03-17 NOTE — Progress Notes (Signed)
[facility] at [place] NAME: [name] [name]    MR#:  [id number]  DATE OF BIRTH:  [birth year]  SUBJECTIVE:   Patient is feeling better  No issues per RN Working well with PT REVIEW OF SYSTEMS:  CONSTITUTIONAL: No fever, fatigue or weakness.  EYES: No blurred or double vision.  EARS, NOSE, AND THROAT: No tinnitus or ear pain.  RESPIRATORY: No cough, shortness of breath, wheezing or hemoptysis.  CARDIOVASCULAR: No chest pain, orthopnea, edema.  GASTROINTESTINAL: No nausea, vomiting, diarrhea or abdominal pain.  GENITOURINARY: No dysuria, hematuria.  ENDOCRINE: No polyuria, nocturia,  HEMATOLOGY: No anemia, easy bruising or bleeding SKIN: No rash or lesion. MUSCULOSKELETAL: No joint pain or arthritis.   NEUROLOGIC: No tingling, numbness, weakness.  PSYCHIATRY: No anxiety or depression.   DRUG ALLERGIES:   Allergies  Allergen Reactions  . Aspirin Other (See Comments)    On xarelto    VITALS:  Blood pressure (!) 156/81, pulse 69, temperature 98.7 F (37.1 C), temperature source Oral, resp. rate 14, height 5\' 2"  (1.575 m), weight 68.3 kg, SpO2 96 %.  PHYSICAL EXAMINATION:  GENERAL:  78 y.o.-year-old patient lying in the bed with no acute distress.  EYES: Pupils equal, round, reactive to light and accommodation. No scleral icterus. Extraocular muscles intact.  HEENT: Head atraumatic, normocephalic. Oropharynx and nasopharynx clear.  NECK:  Supple, no jugular venous distention. No thyroid enlargement, no tenderness.  LUNGS: Normal breath sounds bilaterally, no wheezing, rales,rhonchi or crepitation. No use of accessory muscles of respiration.  CARDIOVASCULAR: S1, S2 normal. No murmurs, rubs, or gallops.  ABDOMEN: Soft, nontender, nondistended. Bowel sounds present.  EXTREMITIES: No pedal edema, cyanosis, or clubbing.  NEUROLOGIC: Awake, alert and oriented x2, echolalia, motor grossly intact at her baseline, sensation intact. Gait  not checked.  PSYCHIATRIC: The patient is alert and oriented x 2  SKIN: No obvious rash, lesion, or ulcer.    LABORATORY PANEL:   CBC Recent Labs  Lab 03/14/19 0544  WBC 7.5  HGB 13.1  HCT 38.6  PLT 152   ------------------------------------------------------------------------------------------------------------------  Chemistries  Recent Labs  Lab 03/11/19 1944 03/12/19 0104 03/14/19 0544  NA 138 140  --   K 3.8 3.8  --   CL 102 108  --   CO2 26 24  --   GLUCOSE 131* 132*  --   BUN 16 16  --   CREATININE 0.77 0.60 0.43*  CALCIUM 10.3 9.3  --   AST 26  --   --   ALT 16  --   --   ALKPHOS 45  --   --   BILITOT 1.2  --   --    ------------------------------------------------------------------------------------------------------------------  Cardiac Enzymes No results for input(s): TROPONINI in the last 168 hours. ------------------------------------------------------------------------------------------------------------------  RADIOLOGY:  No results found.  EKG:   Orders placed or performed in visit on 02/15/17  . EKG 12-Lead    ASSESSMENT AND PLAN:    #Acute/subacute stroke left lateral temporal lobe with  previous history of strokes -now resumed Xarelto -Patient has passed bedside swallow evaluation -MRI of the brain with acute/subacute left lateral temporal lobe infarct -Carotid Dopplers abnormal and  CT angiogram of the neck--no significant stenosis - echocardiogram left ventricle has normal systolic function, with an ejection fraction of 55-60%. The cavity size was normal. Left ventricular diastolic Doppler parameters are consistent with impaired relaxation -Physical therapy is recommending CIR LDL 67 Neurology consult placed, Dr. Doy Mince is following the patient  #  Atrial fibrillation with RVR Patient home medications Xarelto resumed and heparin drip discontinued  #History of lumbar degenerative disc disease status post lumbar laminectomy on  03/10/2019 Recommending outpatient follow-up and resuming Xarelto  #Hypertension Allow permissive hypertension initially will titrate blood pressure medicines for better control as needed.  Currently blood pressure stable   dter [name] aware Awaiting d/c disposition CIR hoping bed becomes available or SNF  CODE STATUS: DO NOT RESUSCITATE, daughter is the healthcare POA  TOTAL TIME TAKING CARE OF THIS PATIENT: 25 minutes.   Discharge when rehab bed available Note: This dictation was prepared with Dragon dictation along with smaller phrase technology. Any transcriptional errors that result from this process are unintentional.   Fritzi Mandes M.D on 03/17/2019 at 1:52 PM  Between 7am to 6pm - Pager - [phone] After 6pm go to [email or web address] - password [email or web address] [facility]  Office  [phone]  CC: Primary care physician; Birdie Sons, MD

## 2019-03-17 NOTE — Progress Notes (Signed)
  Speech Language Pathology Treatment: Cognitive-Linquistic  Patient Details Name: [name] [name] MRN: [id number] DOB: [birth year] Today's Date: 03/17/2019 Time: 1328-1400 SLP Time Calculation (min) (ACUTE ONLY): 32 min  Assessment / Plan / Recommendation Clinical Impression  Pt had just finished working with PT upon SLP entering room, pt was sitting upright in bed, smiling and agreeable to working with SLP even though she hasn't had her lunch yet.  Pt appears to be much improved since initial SLP evaluation on 5/20 when pt was presenting with significant expressive aphasia, possible apraxia, and mild receptive deficits. Pt now presents with mild to mod expressive aphasia, speaking in near fluent meaningful sentences, occasional phonemic and semantic paraphasias evident. Pt able to self-correct paraphasias given SLP cue; however pt does not self identify paraphasias. Pt demonstrated emerging independence using description/semantic feature analysis when experiencing word finding difficulties in conversation. Pt able to name between 7-11 items in a named category independently and 11+ items given Min verbal cues. Provided extensive pt education on necessity and function of use of description to aid word finding to facilitate functional communication. Pt reported and demonstrated understanding. Pt currently awaiting placement at either CIR or SNF. Rec. SLP Eval and treat upon d/c to continue to address cognitive linguistic deficits secondary to CVA to assist pt in returning to PLOF.   HPI HPI: Per SLP eval on 5/20: Pt presented w/ significant Expressive Aphasia w/ potential Apraxia component; she also appeared to present w/ a level of Receptive Aphasia during more complex tasks. These deficits(Aphasia) appeared to impact her ability to follow through w/ Cognitive tasks such as problem solving situations/questions including requiring cues to indicate use of "911" in an emergency. Due to pt's Expressive  Aphasia w/ Receptive Aphasia and the impact on follow through w/ Cognitive tasks/situations, unsure if pt would be able to safely and independently follow through ADLs living alone. Pt's primary Expressive language deficits were noted in Fluency, Repetition, and Naming c/b phonemic and semantic paraphasias w/ perseverations; also possible Apraxia d/t pt's effort/groping w/ sound/word production. Breakdown was noted in Automatic speech and at word/phrase level. Receptive language deficits were noted in more complex tasks on the screening tool: complex Y/Ns and following 3 step commands"      SLP Plan  Continue with current plan of care       Recommendations                   SLP Visit Diagnosis: Aphasia (R47.01) Plan: Continue with current plan of care       GO                Skeeter Sheard, [place], CCC-SLP 03/17/2019, 2:28 PM

## 2019-03-17 NOTE — Progress Notes (Signed)
Physical Therapy Treatment Patient Details Name: [name] [name] MRN: [id number] DOB: 08/07/1941 Today's Date: 03/17/2019    History of Present Illness Pt is 78 yo female admitted for sepsis, AMS, recent back surgery 5/18 per chart (laminectomy), work up showed MRI + for acute infarct, Reduced diffusion within the left lateral temporal lobe extending into left posterior insula compatible with acute/early subacute infarction. PMH of afib, DVT, heart murmur, seizure, possible stroke    PT Comments    Patient in bed and amenable to participation in therapy. Patient demonstrates appropriate safety awareness and awareness of limitations throughout session. Patient asking questions regarding treatment which indicate higher order thinking and ability to problem solve without cueing. Patient able to ambulate in hall for ~200 feet with CGA, RW, visual and verbal cues to normalize gait pattern and decrease foot flat initial contact. Patient self-correcting body position with RW; continues to benefit from intermittent VCs for pace of walking appropriate for safe DME management. Patient able to STS from bed and toilet with UE use, SBA. Patient performing components of Berg Balance Assessment without LOB, but with limitations evident and indicating increased risk for falls (limited weight shift to R, need for BUE support for SLS, decreased forward reaching distance). Patient will continue to benefit from skilled therapeutic services while admitted and upon discharge to address deficits in strength, coordination, balance, mobility, and overall function. Due to patient's progress since evaluation, if CIR is no longer appropriate, STR is recommended to ensure safe return to home with achievement of PLOF.  Patient in bed with lunch and SLP present; all needs met/in reach.     Follow Up Recommendations  CIR;SNF     Equipment Recommendations  Rolling walker with 5" wheels    Recommendations for Other  Services       Precautions / Restrictions Precautions Precautions: Fall Precaution Comments: watch HR Restrictions Weight Bearing Restrictions: No    Mobility  Bed Mobility Overal bed mobility: Needs Assistance Bed Mobility: Supine to Sit;Sit to Supine Rolling: Modified independent (Device/Increase time)   Supine to sit: Supervision Sit to supine: Supervision   General bed mobility comments: Patient demonstrates appropriate sequencing and safety awareness; benefits from increased time and min verbal/visual cues for use of bed rails.   Transfers Overall transfer level: Needs assistance Equipment used: Rolling walker (2 wheeled) Transfers: Sit to/from Stand Sit to Stand: Min guard;Supervision         General transfer comment: Patient able to stand from bed and toilet with use of BUE for support/steadiness.  Ambulation/Gait Ambulation/Gait assistance: Min guard Gait Distance (Feet): 200 Feet Assistive device: Rolling walker (2 wheeled) Gait Pattern/deviations: Step-through pattern;Decreased step length - right;Decreased step length - left Gait velocity: decreased   General Gait Details: Patient pushes RW through RUE >LUE which occasionally requires positioning correction. Patient striking with foot flat B and responds well to VCs for increased knee flexion to promote more normalized foot contact with all rockers of gait demonstrated.   Stairs             Wheelchair Mobility    Modified Rankin (Stroke Patients Only)       Balance Overall balance assessment: Needs assistance Sitting-balance support: No upper extremity supported;Feet supported Sitting balance-Leahy Scale: Good Sitting balance - Comments: steady sitting reaching within BOS   Standing balance support: No upper extremity supported;During functional activity Standing balance-Leahy Scale: Good Standing balance comment: Patient able to perform hygiene at sink without LOB; patient able to perform  components of the  Berg (reaching forward, turning to look behind, SLS) without LOB. Single Leg Stance - Right Leg: 3(BUE support, RW) Single Leg Stance - Left Leg: 5(BUE support, RW)                        Cognition                                              Exercises Other Exercises Other Exercises: Seated heel/toe raises to promote ankle mobility for better ankle strategy during balance activities and more normalized gait pattern. Other Exercises: Standing balance (SBA/CGA): forward reach, no UE support; turning to look behind B, no UE support, modified SLS (toe touch) B, SUE support Other Exercises: Functional mobility: toileting, SBA; hallway ambulation ~200 feet, RW, CGA    General Comments        Pertinent Vitals/Pain Pain Assessment: No/denies pain    Home Living                      Prior Function            PT Goals (current goals can now be found in the care plan section) Progress towards PT goals: Progressing toward goals    Frequency    7X/week      PT Plan Current plan remains appropriate;Discharge plan needs to be updated    Co-evaluation              AM-PAC PT "6 Clicks" Mobility   Outcome Measure  Help needed turning from your back to your side while in a flat bed without using bedrails?: None Help needed moving from lying on your back to sitting on the side of a flat bed without using bedrails?: A Little Help needed moving to and from a bed to a chair (including a wheelchair)?: A Little Help needed standing up from a chair using your arms (e.g., wheelchair or bedside chair)?: A Little Help needed to walk in hospital room?: A Little Help needed climbing 3-5 steps with a railing? : A Lot 6 Click Score: 18    End of Session Equipment Utilized During Treatment: Gait belt Activity Tolerance: Patient tolerated treatment well Patient left: in bed;with bed alarm set   PT Visit Diagnosis: Muscle weakness  (generalized) (M62.81);Difficulty in walking, not elsewhere classified (R26.2);Other symptoms and signs involving the nervous system (R29.898);Other abnormalities of gait and mobility (R26.89)     Time: 6759-1638 PT Time Calculation (min) (ACUTE ONLY): 34 min  Charges:  $Therapeutic Activity: 8-22 mins $Neuromuscular Re-education: 23-37 mins                    Myles Gip PT, DPT [phone] 03/17/2019, 1:45 PM

## 2019-03-18 DIAGNOSIS — I4891 Unspecified atrial fibrillation: Secondary | ICD-10-CM | POA: Diagnosis not present

## 2019-03-18 DIAGNOSIS — I63412 Cerebral infarction due to embolism of left middle cerebral artery: Secondary | ICD-10-CM | POA: Diagnosis not present

## 2019-03-18 DIAGNOSIS — N329 Bladder disorder, unspecified: Secondary | ICD-10-CM | POA: Diagnosis not present

## 2019-03-18 DIAGNOSIS — R4182 Altered mental status, unspecified: Secondary | ICD-10-CM | POA: Diagnosis not present

## 2019-03-18 DIAGNOSIS — I639 Cerebral infarction, unspecified: Secondary | ICD-10-CM | POA: Diagnosis not present

## 2019-03-18 DIAGNOSIS — R41841 Cognitive communication deficit: Secondary | ICD-10-CM | POA: Diagnosis not present

## 2019-03-18 DIAGNOSIS — R262 Difficulty in walking, not elsewhere classified: Secondary | ICD-10-CM | POA: Diagnosis not present

## 2019-03-18 DIAGNOSIS — G4089 Other seizures: Secondary | ICD-10-CM | POA: Diagnosis not present

## 2019-03-18 DIAGNOSIS — F801 Expressive language disorder: Secondary | ICD-10-CM | POA: Diagnosis not present

## 2019-03-18 DIAGNOSIS — I10 Essential (primary) hypertension: Secondary | ICD-10-CM | POA: Diagnosis not present

## 2019-03-18 DIAGNOSIS — M6281 Muscle weakness (generalized): Secondary | ICD-10-CM | POA: Diagnosis not present

## 2019-03-18 DIAGNOSIS — I69359 Hemiplegia and hemiparesis following cerebral infarction affecting unspecified side: Secondary | ICD-10-CM | POA: Diagnosis not present

## 2019-03-18 DIAGNOSIS — M1990 Unspecified osteoarthritis, unspecified site: Secondary | ICD-10-CM | POA: Diagnosis not present

## 2019-03-18 DIAGNOSIS — R531 Weakness: Secondary | ICD-10-CM | POA: Diagnosis not present

## 2019-03-18 DIAGNOSIS — Z8673 Personal history of transient ischemic attack (TIA), and cerebral infarction without residual deficits: Secondary | ICD-10-CM | POA: Diagnosis not present

## 2019-03-18 DIAGNOSIS — R5381 Other malaise: Secondary | ICD-10-CM | POA: Diagnosis not present

## 2019-03-18 DIAGNOSIS — I959 Hypotension, unspecified: Secondary | ICD-10-CM | POA: Diagnosis not present

## 2019-03-18 DIAGNOSIS — Z743 Need for continuous supervision: Secondary | ICD-10-CM | POA: Diagnosis not present

## 2019-03-18 DIAGNOSIS — R2689 Other abnormalities of gait and mobility: Secondary | ICD-10-CM | POA: Diagnosis not present

## 2019-03-18 DIAGNOSIS — R279 Unspecified lack of coordination: Secondary | ICD-10-CM | POA: Diagnosis not present

## 2019-03-18 DIAGNOSIS — R569 Unspecified convulsions: Secondary | ICD-10-CM | POA: Diagnosis not present

## 2019-03-18 DIAGNOSIS — M170 Bilateral primary osteoarthritis of knee: Secondary | ICD-10-CM | POA: Diagnosis not present

## 2019-03-18 DIAGNOSIS — A419 Sepsis, unspecified organism: Secondary | ICD-10-CM | POA: Diagnosis not present

## 2019-03-18 DIAGNOSIS — R4701 Aphasia: Secondary | ICD-10-CM | POA: Diagnosis not present

## 2019-03-18 DIAGNOSIS — I480 Paroxysmal atrial fibrillation: Secondary | ICD-10-CM | POA: Diagnosis not present

## 2019-03-18 DIAGNOSIS — M5136 Other intervertebral disc degeneration, lumbar region: Secondary | ICD-10-CM | POA: Diagnosis not present

## 2019-03-18 DIAGNOSIS — R011 Cardiac murmur, unspecified: Secondary | ICD-10-CM | POA: Diagnosis not present

## 2019-03-18 LAB — SARS CORONAVIRUS 2 BY RT PCR (HOSPITAL ORDER, PERFORMED IN ~~LOC~~ HOSPITAL LAB): SARS Coronavirus 2: NEGATIVE

## 2019-03-18 LAB — ABO/RH: ABO/RH(D): A POS

## 2019-03-18 NOTE — Progress Notes (Signed)
EMS called for transport. Milbert Bixler S, RN  

## 2019-03-18 NOTE — TOC Transition Note (Signed)
Transition of Care [facility]) - CM/SW Discharge Note   Patient Details  Name: [name] [name] MRN: [id number] Date of Birth: 1941-01-23  [facility] CM/SW Contact:  Ross Ludwig, LCSW Phone Number: 03/18/2019, 6:17 PM   Clinical Narrative:     CSW received insurance approval for patient to go to SNF.  Authorization number is [id number].  CSW updated patient's daughter, she was relieved to hear.  Patient to be d/c'ed today to [facility].  Patient and family agreeable to plans will transport via ems RN to call report.     Final next level of care: Skilled Nursing Facility Barriers to Discharge: Continued Medical Work up   Patient Goals and CMS Choice   CMS Medicare.gov Compare Post Acute Care list provided to:: Patient Represenative (must comment)(dtr, [name]) Choice offered to / list presented to : Adult Children  Discharge Placement PASRR number recieved: 03/17/19 Existing PASRR number confirmed : 03/17/19          Patient chooses bed at: [facility] Patient to be transferred to facility by: [facility] Name of family member notified: Patient's daughter [name] [phone] Patient and family notified of of transfer: 03/18/19  Discharge Plan and Services     Post Acute Care Choice: [place]            DME Agency: NA       HH Arranged: NA HH Agency: NA        Social Determinants of Health (SDOH) Interventions     Readmission Risk Interventions Readmission Risk Prevention Plan 03/12/2019  Transportation Screening Complete  Some recent data might be hidden

## 2019-03-18 NOTE — Progress Notes (Signed)
Patient discharged via EMS. Atina Feeley S, RN  

## 2019-03-18 NOTE — Progress Notes (Signed)
Report called to [employer]. Madlyn Frankel, RN

## 2019-03-18 NOTE — Discharge Summary (Signed)
[facility] NAME: [name] [name]    MR#:  [id number]  DATE OF BIRTH:  [birth year]  DATE OF ADMISSION:  03/11/2019 ADMITTING PHYSICIAN: Lance Coon, MD  DATE OF DISCHARGE: *03/18/2019  PRIMARY CARE PHYSICIAN: Birdie Sons, MD    ADMISSION DIAGNOSIS:  Altered mental status, unspecified altered mental status type [R41.82] Sepsis, due to unspecified organism, unspecified whether acute organ dysfunction present ([place]) [A41.9]  DISCHARGE DIAGNOSIS:  Acute/subacute stroke left lateral temporal lobe  Atrial fibrillation with RVR--on oral anticoagulation  SECONDARY DIAGNOSIS:   Past Medical History:  Diagnosis Date  . Arthritis    osteo; in B knees;   . Bladder disorder   . Chicken pox   . Chronic a-fib   . DVT (deep venous thrombosis) ([place])    H/O  . Dyspnea   . Dysrhythmia    a fib  . Edema   . Hay fever   . Heart murmur   . Hypertension    controlled  . Irregular heart beat   . Seizure ([place])    2 years ago knocked unconscious; hospitalized and diagnosed with seizures; no spells in last year;   . Stroke [facility])    1 month ago (possible stroke)  . Unspecified atrial fibrillation [facility])     HOSPITAL COURSE:  #Acute/subacute stroke left lateral temporal lobe with  previous history of strokes -nowon Xarelto -MRI of the brain with acute/subacute left lateral temporal lobe infarct -Carotid Dopplers abnormal and  CT angiogram of the neck--no significant stenosis - echocardiogram left ventricle has normal systolic function, with an ejection fraction of 55-60%. The cavity size was normal. Left ventricular diastolic Doppler parameters are consistent with impaired relaxation -Physical therapy is recommending rehab LDL 67 Neurology consult  with Dr. Doy Mince appreciated  #Atrial fibrillation with RVR Patient home medications Xarelto resumed  -cont diltiazem and metoprolol  #History of lumbar degenerative disc  disease status post lumbar laminectomy on 03/10/2019 Pt will follow with her spine surgeon per recommendations given to her.  #Hypertension - Currently blood pressure stable -resumed home meds  Patient overall improving slowly. She will discharge for rehab today.   CONSULTS OBTAINED:  Treatment Team:  Alexis Goodell, MD [place], MD  DRUG ALLERGIES:   Allergies  Allergen Reactions  . Aspirin Other (See Comments)    On xarelto    DISCHARGE MEDICATIONS:   Allergies as of 03/18/2019      Reactions   Aspirin Other (See Comments)   On xarelto      Medication List    STOP taking these medications   oxyCODONE-acetaminophen 7.5-325 MG tablet Commonly known as:  PERCOCET     TAKE these medications   acetaminophen 650 MG CR tablet Commonly known as:  TYLENOL Take 650 mg by mouth every 8 (eight) hours as needed for pain.   ACIDOPHILUS PO Take 1 tablet by mouth at bedtime.   alendronate 70 MG tablet Commonly known as:  FOSAMAX Take 1 tablet (70 mg total) by mouth once a week.   Aspercreme w/Lidocaine 4 % cream Generic drug:  lidocaine Apply 1 application topically daily as needed. Apply to affected area for pain   CALCIUM 1200+D3 PO Take 1,200 mg by mouth daily.   cholecalciferol 1000 units tablet Commonly known as:  VITAMIN D Take 1,000 Units by mouth daily.   Co Q-10 100 MG Caps Take 100 mg by mouth daily.   diltiazem 240 MG 24 hr capsule Commonly known  as:  CARDIZEM CD TAKE 1 CAPSULE BY MOUTH EVERY DAY.   EQL B Complex 100 Tabs Take 0.4 mg tablet once daily by mouth   gabapentin 300 MG capsule Commonly known as:  NEURONTIN Take 1 capsule (300 mg total) by mouth 2 (two) times daily.   MEGARED OMEGA-3 KRILL OIL PO Take 300 mg by mouth daily.   metoprolol tartrate 25 MG tablet Commonly known as:  LOPRESSOR TAKE 1 TABLET BY MOUTH TWICE A DAY. What changed:  when to take this   oxybutynin 5 MG tablet Commonly known as:  DITROPAN TAKE 1  TABLET BY MOUTH 2 TIMES DAILY   TURMERIC PO Take 800 mg by mouth 2 (two) times daily.   Xarelto 20 MG Tabs tablet Generic drug:  rivaroxaban TAKE 1 TABLET BY MOUTH DAILY WITH SUPPER       If you experience worsening of your admission symptoms, develop shortness of breath, life threatening emergency, suicidal or homicidal thoughts you must seek medical attention immediately by calling 911 or calling your MD immediately  if symptoms less severe.  You Must read complete instructions/literature along with all the possible adverse reactions/side effects for all the Medicines you take and that have been prescribed to you. Take any new Medicines after you have completely understood and accept all the possible adverse reactions/side effects.   Please note  You were cared for by a hospitalist during your hospital stay. If you have any questions about your discharge medications or the care you received while you were in the hospital after you are discharged, you can call the unit and asked to speak with the hospitalist on call if the hospitalist that took care of you is not available. Once you are discharged, your primary care physician will handle any further medical issues. Please note that NO REFILLS for any discharge medications will be authorized once you are discharged, as it is imperative that you return to your primary care physician (or establish a relationship with a primary care physician if you do not have one) for your aftercare needs so that they can reassess your need for medications and monitor your lab values. Today   SUBJECTIVE   No new complaints. She is overall slowly improving  VITAL SIGNS:  Blood pressure 120/69, pulse 63, temperature 98 F (36.7 C), temperature source Oral, resp. rate 16, height 5\' 2"  (1.575 m), weight 67.9 kg, SpO2 100 %.  I/O:    Intake/Output Summary (Last 24 hours) at 03/18/2019 1207 Last data filed at 03/18/2019 1059 Gross per 24 hour  Intake 360 ml   Output 1000 ml  Net -640 ml    PHYSICAL EXAMINATION:  GENERAL:  78 y.o.-year-old patient lying in the bed with no acute distress.  EYES: Pupils equal, round, reactive to light and accommodation. No scleral icterus. Extraocular muscles intact.  HEENT: Head atraumatic, normocephalic. Oropharynx and nasopharynx clear.  NECK:  Supple, no jugular venous distention. No thyroid enlargement, no tenderness.  LUNGS: Normal breath sounds bilaterally, no wheezing, rales,rhonchi or crepitation. No use of accessory muscles of respiration.  CARDIOVASCULAR: S1, S2 normal. No murmurs, rubs, or gallops.  ABDOMEN: Soft, non-tender, non-distended. Bowel sounds present. No organomegaly or mass.  EXTREMITIES: No pedal edema, cyanosis, or clubbing.  NEUROLOGIC: Cranial nerves II through XII are intact. Muscle strength 5/5 in all extremities. Sensation intact. Gait not checked. Intermittent mild expressive aphasia PSYCHIATRIC: The patient is alert and oriented x 3.  SKIN: No obvious rash, lesion, or ulcer.   DATA REVIEW:  CBC  Recent Labs  Lab 03/14/19 0544  WBC 7.5  HGB 13.1  HCT 38.6  PLT 152    Chemistries  Recent Labs  Lab 03/11/19 1944 03/12/19 0104 03/14/19 0544  NA 138 140  --   K 3.8 3.8  --   CL 102 108  --   CO2 26 24  --   GLUCOSE 131* 132*  --   BUN 16 16  --   CREATININE 0.77 0.60 0.43*  CALCIUM 10.3 9.3  --   AST 26  --   --   ALT 16  --   --   ALKPHOS 45  --   --   BILITOT 1.2  --   --     Microbiology Results   Recent Results (from the past 240 hour(s))  Urine culture     Status: None   Collection Time: 03/11/19  5:00 AM  Result Value Ref Range Status   Specimen Description   Final    URINE, RANDOM Performed at [facility], [address], [place], [place] [address]    Special Requests   Final    NONE Performed at [facility], [address] [place], [place] [address]    Culture   Final    NO GROWTH Performed at [facility], [place] [address], [place], [place] [address]    Report Status 03/13/2019 FINAL  Final  Blood Culture (routine x 2)     Status: None   Collection Time: 03/11/19  7:44 PM  Result Value Ref Range Status   Specimen Description BLOOD LEFT ANTECUBITAL  Final   Special Requests   Final    BOTTLES DRAWN AEROBIC AND ANAEROBIC Blood Culture adequate volume   Culture   Final    NO GROWTH 5 DAYS Performed at [facility], [address], [place], [place] [address]    Report Status 03/16/2019 FINAL  Final  SARS Coronavirus 2 (CEPHEID - Performed in [place] hospital lab), Hosp Order     Status: None   Collection Time: 03/11/19  7:45 PM  Result Value Ref Range Status   SARS Coronavirus 2 NEGATIVE NEGATIVE Final    Comment: (NOTE) If result is NEGATIVE SARS-CoV-2 target nucleic acids are NOT DETECTED. The SARS-CoV-2 RNA is generally detectable in upper and lower  respiratory specimens during the acute phase of infection. The lowest  concentration of SARS-CoV-2 viral copies this assay can detect is 250  copies / mL. A negative result does not preclude SARS-CoV-2 infection  and should not be used as the sole basis for treatment or other  patient management decisions.  A negative result may occur with  improper specimen collection / handling, submission of specimen other  than nasopharyngeal swab, presence of viral mutation(s) within the  areas targeted by this assay, and inadequate number of viral copies  (<250 copies / mL). A negative result must be combined with clinical  observations, patient history, and epidemiological information. If result is POSITIVE SARS-CoV-2 target nucleic acids are DETECTED. The SARS-CoV-2 RNA is generally detectable in upper and lower  respiratory specimens dur ing the acute phase of infection.  Positive  results are indicative of active infection with SARS-CoV-2.  Clinical  correlation with patient history and other diagnostic information is  necessary  to determine patient infection status.  Positive results do  not rule out bacterial infection or co-infection with other viruses. If result is PRESUMPTIVE POSTIVE SARS-CoV-2 nucleic acids MAY BE PRESENT.  A presumptive positive result was obtained on the submitted specimen  and confirmed on repeat testing.  While 2019 novel coronavirus  (SARS-CoV-2) nucleic acids may be present in the submitted sample  additional confirmatory testing may be necessary for epidemiological  and / or clinical management purposes  to differentiate between  SARS-CoV-2 and other Sarbecovirus currently known to infect humans.  If clinically indicated additional testing with an alternate test  methodology [phone]) is advised. The SARS-CoV-2 RNA is generally  detectable in upper and lower respiratory sp ecimens during the acute  phase of infection. The expected result is Negative. Fact Sheet for Patients:  StrictlyIdeas.no Fact Sheet for Healthcare Providers: BankingDealers.co.za This test is not yet approved or cleared by the [place] FDA and has been authorized for detection and/or diagnosis of SARS-CoV-2 by FDA under an Emergency Use Authorization (EUA).  This EUA will remain in effect (meaning this test can be used) for the duration of the COVID-19 declaration under Section 564(b)(1) of the Act, 21 U.S.C. section 360bbb-3(b)(1), unless the authorization is terminated or revoked sooner. Performed at [facility], [address] [place], [place] [address]   Blood Culture (routine x 2)     Status: None   Collection Time: 03/11/19  7:49 PM  Result Value Ref Range Status   Specimen Description BLOOD RIGHT ANTECUBITAL  Final   Special Requests   Final    BOTTLES DRAWN AEROBIC AND ANAEROBIC Blood Culture adequate volume   Culture   Final    NO GROWTH 5 DAYS Performed at [facility], [address] [place], [place] [address]     Report Status 03/16/2019 FINAL  Final  Respiratory Panel by PCR     Status: None   Collection Time: 03/13/19  3:11 PM  Result Value Ref Range Status   Adenovirus NOT DETECTED NOT DETECTED Final   Coronavirus 229E NOT DETECTED NOT DETECTED Final    Comment: (NOTE) The Coronavirus on the Respiratory Panel, DOES NOT test for the novel  Coronavirus (2019 nCoV)    Coronavirus HKU1 NOT DETECTED NOT DETECTED Final   Coronavirus NL63 NOT DETECTED NOT DETECTED Final   Coronavirus OC43 NOT DETECTED NOT DETECTED Final   Metapneumovirus NOT DETECTED NOT DETECTED Final   Rhinovirus / Enterovirus NOT DETECTED NOT DETECTED Final   Influenza A NOT DETECTED NOT DETECTED Final   Influenza B NOT DETECTED NOT DETECTED Final   Parainfluenza Virus 1 NOT DETECTED NOT DETECTED Final   Parainfluenza Virus 2 NOT DETECTED NOT DETECTED Final   Parainfluenza Virus 3 NOT DETECTED NOT DETECTED Final   Parainfluenza Virus 4 NOT DETECTED NOT DETECTED Final   Respiratory Syncytial Virus NOT DETECTED NOT DETECTED Final   Bordetella pertussis NOT DETECTED NOT DETECTED Final   Chlamydophila pneumoniae NOT DETECTED NOT DETECTED Final   Mycoplasma pneumoniae NOT DETECTED NOT DETECTED Final    Comment: Performed at [facility], [place] [address], [place], [place] [address]    RADIOLOGY:  No results found.   CODE STATUS:     Code Status Orders  (From admission, onward)         Start     Ordered   03/12/19 1020  Do not attempt resuscitation (DNR)  Continuous    Question Answer Comment  In the event of cardiac or respiratory ARREST Do not call a "code blue"   In the event of cardiac or respiratory ARREST Do not perform Intubation, CPR, defibrillation or ACLS   In the event  of cardiac or respiratory ARREST Use medication by any route, position, wound care, and other measures to relive pain and suffering. May use oxygen, suction and manual treatment of airway obstruction as needed for comfort.   Comments RN may  pronounce      03/12/19 1020        Code Status History    Date Active Date Inactive Code Status Order ID Comments User Context   03/12/2019 0048 03/12/2019 1020 Full Code [id number]  Lance Coon, MD Inpatient   04/23/2017 1203 04/25/2017 1530 Full Code [id number]  Dereck Leep, MD Inpatient      TOTAL TIME TAKING CARE OF THIS PATIENT: *40* minutes.    Fritzi Mandes M.D on 03/18/2019 at 12:07 PM  Between 7am to 6pm - Pager - [phone] After 6pm go to [email or web address] - password [email or web address] [facility]  Office  [phone]  CC: Primary care physician; Birdie Sons, MD

## 2019-03-19 DIAGNOSIS — R5381 Other malaise: Secondary | ICD-10-CM | POA: Diagnosis not present

## 2019-03-19 DIAGNOSIS — I69359 Hemiplegia and hemiparesis following cerebral infarction affecting unspecified side: Secondary | ICD-10-CM | POA: Diagnosis not present

## 2019-03-19 DIAGNOSIS — Z8673 Personal history of transient ischemic attack (TIA), and cerebral infarction without residual deficits: Secondary | ICD-10-CM | POA: Diagnosis not present

## 2019-03-19 DIAGNOSIS — R4701 Aphasia: Secondary | ICD-10-CM | POA: Diagnosis not present

## 2019-03-25 DIAGNOSIS — R569 Unspecified convulsions: Secondary | ICD-10-CM | POA: Diagnosis not present

## 2019-03-25 DIAGNOSIS — R5381 Other malaise: Secondary | ICD-10-CM | POA: Diagnosis not present

## 2019-03-25 DIAGNOSIS — I69359 Hemiplegia and hemiparesis following cerebral infarction affecting unspecified side: Secondary | ICD-10-CM | POA: Diagnosis not present

## 2019-03-25 DIAGNOSIS — R4701 Aphasia: Secondary | ICD-10-CM | POA: Diagnosis not present

## 2019-04-04 DIAGNOSIS — R5381 Other malaise: Secondary | ICD-10-CM | POA: Diagnosis not present

## 2019-04-04 DIAGNOSIS — R4701 Aphasia: Secondary | ICD-10-CM | POA: Diagnosis not present

## 2019-04-04 DIAGNOSIS — Z8673 Personal history of transient ischemic attack (TIA), and cerebral infarction without residual deficits: Secondary | ICD-10-CM | POA: Diagnosis not present

## 2019-04-04 DIAGNOSIS — I69359 Hemiplegia and hemiparesis following cerebral infarction affecting unspecified side: Secondary | ICD-10-CM | POA: Diagnosis not present

## 2019-04-09 ENCOUNTER — Telehealth: Payer: Self-pay

## 2019-04-09 DIAGNOSIS — Z7901 Long term (current) use of anticoagulants: Secondary | ICD-10-CM | POA: Diagnosis not present

## 2019-04-09 DIAGNOSIS — Z79891 Long term (current) use of opiate analgesic: Secondary | ICD-10-CM | POA: Diagnosis not present

## 2019-04-09 DIAGNOSIS — Z87891 Personal history of nicotine dependence: Secondary | ICD-10-CM | POA: Diagnosis not present

## 2019-04-09 DIAGNOSIS — I482 Chronic atrial fibrillation, unspecified: Secondary | ICD-10-CM | POA: Diagnosis not present

## 2019-04-09 DIAGNOSIS — Z86718 Personal history of other venous thrombosis and embolism: Secondary | ICD-10-CM | POA: Diagnosis not present

## 2019-04-09 DIAGNOSIS — I69320 Aphasia following cerebral infarction: Secondary | ICD-10-CM | POA: Diagnosis not present

## 2019-04-09 DIAGNOSIS — I10 Essential (primary) hypertension: Secondary | ICD-10-CM | POA: Diagnosis not present

## 2019-04-09 DIAGNOSIS — M5136 Other intervertebral disc degeneration, lumbar region: Secondary | ICD-10-CM | POA: Diagnosis not present

## 2019-04-09 DIAGNOSIS — Z9181 History of falling: Secondary | ICD-10-CM | POA: Diagnosis not present

## 2019-04-09 NOTE — Telephone Encounter (Signed)
Verbal orders given to Sarah.

## 2019-04-09 NOTE — Telephone Encounter (Signed)
That's fine

## 2019-04-09 NOTE — Telephone Encounter (Signed)
Sarah with [facility] needs verbal order for nurse to see patient 1 time per wk x 5 wks  [phone]  Pt refused home health aid.

## 2019-04-10 ENCOUNTER — Other Ambulatory Visit: Payer: Self-pay | Admitting: Family Medicine

## 2019-04-11 ENCOUNTER — Telehealth: Payer: Self-pay | Admitting: Family Medicine

## 2019-04-11 NOTE — Telephone Encounter (Signed)
Jatana with [facility] called for verbal PT orders  1 week 1 time, twice for 3 weeks   CB#  [phone]  Thanks teri

## 2019-04-11 NOTE — Telephone Encounter (Signed)
Ms. Edison Pace advised.   Thanks,   -Mickel Baas

## 2019-04-11 NOTE — Telephone Encounter (Signed)
That's fine

## 2019-04-14 ENCOUNTER — Telehealth: Payer: Self-pay | Admitting: Family Medicine

## 2019-04-14 DIAGNOSIS — R569 Unspecified convulsions: Secondary | ICD-10-CM | POA: Diagnosis not present

## 2019-04-14 DIAGNOSIS — I69319 Unspecified symptoms and signs involving cognitive functions following cerebral infarction: Secondary | ICD-10-CM | POA: Diagnosis not present

## 2019-04-14 NOTE — Telephone Encounter (Signed)
Rowe Clack, OT w/ [facility] [phone]  Delay in OT evaluation due to pt not returning calls.   Thanks, [employer]

## 2019-04-15 ENCOUNTER — Telehealth: Payer: Self-pay | Admitting: Family Medicine

## 2019-04-15 NOTE — Telephone Encounter (Signed)
That's fine

## 2019-04-15 NOTE — Telephone Encounter (Signed)
Left message advising Nancy.  Thanks,   -Laura  

## 2019-04-15 NOTE — Telephone Encounter (Signed)
[name] [name] with [facility] is requesting verbal orders for in home occupational therapy as follows:  2w1 1w1  Please advise. Thanks TNP

## 2019-04-22 ENCOUNTER — Other Ambulatory Visit: Payer: Self-pay | Admitting: Family Medicine

## 2019-04-22 ENCOUNTER — Telehealth: Payer: Self-pay | Admitting: *Deleted

## 2019-04-22 DIAGNOSIS — M4807 Spinal stenosis, lumbosacral region: Secondary | ICD-10-CM

## 2019-04-22 MED ORDER — OXYCODONE-ACETAMINOPHEN 7.5-325 MG PO TABS: 1.0000 | ORAL_TABLET | Freq: Three times a day (TID) | ORAL | 0 refills | Status: DC | PRN

## 2019-04-22 NOTE — Telephone Encounter (Signed)
Patient is requesting refill for oxycodone 7.5-325 mg. Advised pt rx has been discontinued by provider. Patient wants to see if Dr. Caryn Section will give her a prescription to use prn during her PT. Advised patient Dr. Caryn Section is out of the office this week. Patient requested we send the message to another provider. Please advise?

## 2019-04-22 NOTE — Telephone Encounter (Signed)
#  20 tabs given.

## 2019-05-05 ENCOUNTER — Telehealth: Payer: Self-pay | Admitting: Family Medicine

## 2019-05-05 NOTE — Telephone Encounter (Signed)
[name] [name] with [facility] is requesting call back to confirm Dx of residual effects of pt's most recent CVA. Please advise. Thanks TNP

## 2019-05-06 ENCOUNTER — Encounter: Payer: Self-pay | Admitting: Family Medicine

## 2019-05-06 ENCOUNTER — Other Ambulatory Visit: Payer: Self-pay

## 2019-05-06 ENCOUNTER — Telehealth: Payer: Self-pay

## 2019-05-06 ENCOUNTER — Ambulatory Visit (INDEPENDENT_AMBULATORY_CARE_PROVIDER_SITE_OTHER): Payer: PPO | Admitting: Family Medicine

## 2019-05-06 VITALS — BP 101/66 | HR 68 | Temp 97.8°F | Wt 153.0 lb

## 2019-05-06 DIAGNOSIS — Z8673 Personal history of transient ischemic attack (TIA), and cerebral infarction without residual deficits: Secondary | ICD-10-CM | POA: Diagnosis not present

## 2019-05-06 DIAGNOSIS — M4807 Spinal stenosis, lumbosacral region: Secondary | ICD-10-CM

## 2019-05-06 DIAGNOSIS — Z9181 History of falling: Secondary | ICD-10-CM | POA: Diagnosis not present

## 2019-05-06 DIAGNOSIS — R4701 Aphasia: Secondary | ICD-10-CM

## 2019-05-06 DIAGNOSIS — Z7901 Long term (current) use of anticoagulants: Secondary | ICD-10-CM | POA: Diagnosis not present

## 2019-05-06 DIAGNOSIS — Z79891 Long term (current) use of opiate analgesic: Secondary | ICD-10-CM | POA: Diagnosis not present

## 2019-05-06 DIAGNOSIS — I482 Chronic atrial fibrillation, unspecified: Secondary | ICD-10-CM | POA: Diagnosis not present

## 2019-05-06 DIAGNOSIS — L989 Disorder of the skin and subcutaneous tissue, unspecified: Secondary | ICD-10-CM

## 2019-05-06 DIAGNOSIS — Z86718 Personal history of other venous thrombosis and embolism: Secondary | ICD-10-CM | POA: Diagnosis not present

## 2019-05-06 DIAGNOSIS — I69320 Aphasia following cerebral infarction: Secondary | ICD-10-CM | POA: Diagnosis not present

## 2019-05-06 DIAGNOSIS — Z87891 Personal history of nicotine dependence: Secondary | ICD-10-CM | POA: Diagnosis not present

## 2019-05-06 DIAGNOSIS — I10 Essential (primary) hypertension: Secondary | ICD-10-CM | POA: Diagnosis not present

## 2019-05-06 DIAGNOSIS — I4891 Unspecified atrial fibrillation: Secondary | ICD-10-CM

## 2019-05-06 DIAGNOSIS — M5136 Other intervertebral disc degeneration, lumbar region: Secondary | ICD-10-CM | POA: Diagnosis not present

## 2019-05-06 MED ORDER — METOPROLOL SUCCINATE ER 25 MG PO TB24: 12.5000 mg | ORAL_TABLET | Freq: Every day | ORAL | 3 refills | Status: DC

## 2019-05-06 MED ORDER — OXYCODONE-ACETAMINOPHEN 7.5-325 MG PO TABS: 1.0000 | ORAL_TABLET | Freq: Three times a day (TID) | ORAL | 0 refills | Status: DC | PRN

## 2019-05-06 NOTE — Progress Notes (Addendum)
Patient: [name] [name] Female    DOB: 1941/02/05   78 y.o.   MRN: [id number] Visit Date: 05/06/2019  Today's Provider: Lelon Huh, MD   Chief Complaint  Patient presents with  . Skin Problem    Skin changes on right hand.  First noticed it about a month ago.   . Hip Pain    Left hip and leg pain  . Blood Pressure Check    Pt states her blood pressure has been running low.     Subjective:      Hypertension, follow-up:  BP Readings from Last 3 Encounters:  05/06/19 101/66  03/18/19 (!) 119/55  09/12/18 120/80    She was last seen for hypertension 7 months ago.  BP at that visit was 120/80. Management since that visit includes no changes. She reports excellent compliance with treatment. She is not having side effects.  She is exercising. She is adherent to low salt diet.   Outside blood pressures are running low at home. 110/50's She is experiencing none.  Patient denies chest pain, irregular heart beat, lower extremity edema and palpitations.   Cardiovascular risk factors include advanced age (older than 52 for men, 77 for women) and hypertension.  Use of agents associated with hypertension: none.     Weight trend: stable Wt Readings from Last 3 Encounters:  05/06/19 153 lb (69.4 kg)  03/18/19 149 lb 9.6 oz (67.9 kg)  09/12/18 153 lb (69.4 kg)    Current diet: in general, a "healthy" diet    ------------------------------------------------------------------------  She was admitted to [facility] 03/10/2018 with altered mental status, impaired speech and new left lateral temporal lobe CVA. She has been doing PT and SLT since then with significant improvement, but speech is not quite to baseline.   Hip Pain  Incident onset: Pt states the pain has been going on for about a year. There was no injury mechanism. The pain is present in the left hip. The pain has been worsening since onset. Associated symptoms include an inability to bear weight, a loss of  motion, muscle weakness and numbness. Pertinent negatives include no loss of sensation or tingling. The symptoms are aggravated by weight bearing and movement. She has tried acetaminophen and NSAIDs (Oxycodone/acetaminophen ) for the symptoms. The treatment provided mild relief.    Allergies  Allergen Reactions  . Aspirin Other (See Comments)    On xarelto     Current Outpatient Medications:  .  acetaminophen (TYLENOL) 650 MG CR tablet, Take 650 mg by mouth every 8 (eight) hours as needed for pain., Disp: , Rfl:  .  alendronate (FOSAMAX) 70 MG tablet, Take 1 tablet (70 mg total) by mouth once a week., Disp: 12 tablet, Rfl: 4 .  B Complex-Biotin-FA (EQL B COMPLEX 100) TABS, Take 0.4 mg tablet once daily by mouth, Disp: , Rfl:  .  Calcium-Magnesium-Vitamin D (CALCIUM 1200+D3 PO), Take 1,200 mg by mouth daily., Disp: , Rfl:  .  cholecalciferol (VITAMIN D) 1000 units tablet, Take 1,000 Units by mouth daily., Disp: , Rfl:  .  Coenzyme Q10 (CO Q-10) 100 MG CAPS, Take 100 mg by mouth daily. , Disp: , Rfl:  .  diltiazem (CARDIZEM CD) 240 MG 24 hr capsule, TAKE 1 CAPSULE BY MOUTH DAILY, Disp: 90 capsule, Rfl: 4 .  gabapentin (NEURONTIN) 300 MG capsule, Take 1 capsule (300 mg total) by mouth 2 (two) times daily., Disp: 180 capsule, Rfl: 4 .  Lactobacillus (ACIDOPHILUS PO), Take 1  tablet by mouth at bedtime. , Disp: , Rfl:  .  lidocaine (ASPERCREME W/LIDOCAINE) 4 % cream, Apply 1 application topically daily as needed. Apply to affected area for pain, Disp: , Rfl:  .  MEGARED OMEGA-3 KRILL OIL PO, Take 300 mg by mouth daily. , Disp: , Rfl:  .  metoprolol tartrate (LOPRESSOR) 25 MG tablet, TAKE 1 TABLET BY MOUTH TWICE A DAY. (Patient taking differently: Take 25 mg by mouth daily. ), Disp: 180 tablet, Rfl: 4 .  oxybutynin (DITROPAN) 5 MG tablet, TAKE 1 TABLET BY MOUTH 2 TIMES DAILY, Disp: 180 tablet, Rfl: 3 .  oxyCODONE-acetaminophen (PERCOCET) 7.5-325 MG tablet, Take 1 tablet by mouth every 8 (eight)  hours as needed for severe pain., Disp: 20 tablet, Rfl: 0 .  TURMERIC PO, Take 800 mg by mouth 2 (two) times daily., Disp: , Rfl:  .  XARELTO 20 MG TABS tablet, TAKE 1 TABLET BY MOUTH DAILY WITH SUPPER, Disp: 90 tablet, Rfl: 4  Review of Systems  Constitutional: Negative.   Respiratory: Negative.   Cardiovascular: Positive for palpitations (History of A-fib). Negative for chest pain and leg swelling.  Gastrointestinal: Positive for constipation. Negative for abdominal distention, abdominal pain, anal bleeding, blood in stool, diarrhea, nausea, rectal pain and vomiting.  Musculoskeletal: Positive for arthralgias, back pain and gait problem. Negative for joint swelling, myalgias, neck pain and neck stiffness.  Neurological: Positive for light-headedness (Occasionally) and numbness. Negative for dizziness, tingling and headaches.    Social History   Tobacco Use  . Smoking status: Former Smoker    Quit date: 04/11/1972    Years since quitting: 47.0  . Smokeless tobacco: Never Used  Substance Use Topics  . Alcohol use: No    Alcohol/week: 0.0 standard drinks      Objective:   BP 101/66 (BP Location: Right Arm, Patient Position: Sitting, Cuff Size: Normal)   Pulse 68   Temp 97.8 F (36.6 C) (Oral)   Wt 153 lb (69.4 kg)   BMI 27.98 kg/m  Vitals:   05/06/19 0958  BP: 101/66  Pulse: 68  Temp: 97.8 F (36.6 C)  TempSrc: Oral  Weight: 153 lb (69.4 kg)     Physical Exam   General Appearance:    Alert, cooperative, no distress  Eyes:    PERRL, conjunctiva/corneas clear, EOM's intact       Lungs:     Clear to auscultation bilaterally, respirations unlabored  Heart:    Regular rate and rhythm  Neurologic:   Awake, alert, oriented x 3. No apparent focal neurological           defect.   Derm:   Hyperkeratotic exophytic lesion about 43mm dm and extruding about 60mm from skin on dorsum of right hand.         Assessment & Plan    1. Skin lesion of hand  - Ambulatory referral to  Dermatology  2. Spinal stenosis of lumbosacral region refill- oxyCODONE-acetaminophen (PERCOCET) 7.5-325 MG tablet; Take 1 tablet by mouth every 8 (eight) hours as needed for severe pain.  Dispense: 30 tablet; Refill: 0  3. Atrial fibrillation with RVR (HCC) Rate well controlled, on NOAC  4. HYPERTENSION, BENIGN Having some low BP at home and is only taking 1 tablet of metoprolol tartate daily. Will change to metoprolol succinate for more stable BP and reduce to 1/2 tablet  5. Follow up CVA 5/192020. Significantly improved and benefiting from PT/SLT.  The entirety of the information documented in the History of Present  Illness, Review of Systems and Physical Exam were personally obtained by me. Portions of this information were initially documented by Ashley Royalty, CMA and reviewed by me for thoroughness and accuracy.   6. Expressive aphasia secondary to CVA    Lelon Huh, MD  [facility]

## 2019-05-06 NOTE — Telephone Encounter (Signed)
I don't understand the original message. Please get more information.

## 2019-05-06 NOTE — Telephone Encounter (Signed)
Spoke to Holt at [employer] [facility]. She just needed to know if you were in agreement that the patient's CVA caused the most recent effects of the aphasia.

## 2019-05-06 NOTE — Telephone Encounter (Signed)
Yes, the aphasia is entirely due to recent CVA.

## 2019-05-06 NOTE — Telephone Encounter (Signed)
Please advise 

## 2019-05-06 NOTE — Patient Instructions (Addendum)
.   Please review the attached list of medications and notify my office if there are any errors.   . Please bring all of your medications to every appointment so we can make sure that our medication list is the same as yours.   . We will have flu vaccines available after Labor Day. Please go to your pharmacy or call the office in early September to schedule you flu shot.   You can use OTC diclofenac (Voltaren) 1% gel on your hips and knees to help with pains

## 2019-05-06 NOTE — Telephone Encounter (Signed)
Melinda advised.   Thanks,   -Mickel Baas

## 2019-05-06 NOTE — Telephone Encounter (Signed)
[name] [name] with [facility] has called back today requesting a call from the nurse or doctor regarding her call from yesterday  CB#  [phone]  [facility]

## 2019-05-08 NOTE — Telephone Encounter (Signed)
Error

## 2019-05-14 ENCOUNTER — Telehealth: Payer: Self-pay

## 2019-05-14 DIAGNOSIS — I69320 Aphasia following cerebral infarction: Secondary | ICD-10-CM | POA: Diagnosis not present

## 2019-05-14 DIAGNOSIS — M5136 Other intervertebral disc degeneration, lumbar region: Secondary | ICD-10-CM | POA: Diagnosis not present

## 2019-05-14 DIAGNOSIS — I482 Chronic atrial fibrillation, unspecified: Secondary | ICD-10-CM | POA: Diagnosis not present

## 2019-05-14 DIAGNOSIS — I10 Essential (primary) hypertension: Secondary | ICD-10-CM | POA: Diagnosis not present

## 2019-05-14 NOTE — Telephone Encounter (Signed)
Pt was referred to Dermatology for a skin lesion on her hand.  She states she has not heard anything back yet.    Please advise.   Thanks,   -Mickel Baas

## 2019-05-14 NOTE — Telephone Encounter (Signed)
Please send to sarah

## 2019-05-19 ENCOUNTER — Telehealth: Payer: Self-pay | Admitting: Family Medicine

## 2019-05-19 NOTE — Telephone Encounter (Signed)
Very little of medication gets absorbed into blood stream. Would limit its use to no more than twice a day.

## 2019-05-19 NOTE — Telephone Encounter (Signed)
Tried calling patient. Mailbox full. Unable to leave message. Will try calling back later.

## 2019-05-19 NOTE — Telephone Encounter (Signed)
Pt stated that she was advised she could get Voltaren gel OTC and use for her hip and back pain. Pt stated that she was reading the info about the medication and it was noted that if you have suffered a stroke to contact provider before using. Pt is requesting call back to discuss if it is ok to use. Please advise. Thanks TNP

## 2019-05-20 NOTE — Telephone Encounter (Signed)
Tried calling; pt's mailbox it full.    Thanks,   -Mickel Baas

## 2019-05-23 NOTE — Telephone Encounter (Signed)
Pt advised.   Thanks,   -Laura  

## 2019-06-02 DIAGNOSIS — D0461 Carcinoma in situ of skin of right upper limb, including shoulder: Secondary | ICD-10-CM | POA: Diagnosis not present

## 2019-06-02 DIAGNOSIS — L821 Other seborrheic keratosis: Secondary | ICD-10-CM | POA: Diagnosis not present

## 2019-06-02 DIAGNOSIS — D485 Neoplasm of uncertain behavior of skin: Secondary | ICD-10-CM | POA: Diagnosis not present

## 2019-06-02 DIAGNOSIS — L820 Inflamed seborrheic keratosis: Secondary | ICD-10-CM | POA: Diagnosis not present

## 2019-06-05 ENCOUNTER — Ambulatory Visit: Payer: PPO

## 2019-06-05 DIAGNOSIS — M5416 Radiculopathy, lumbar region: Secondary | ICD-10-CM | POA: Diagnosis not present

## 2019-06-24 ENCOUNTER — Telehealth: Payer: Self-pay | Admitting: Family Medicine

## 2019-06-24 NOTE — Telephone Encounter (Signed)
Pt needing to know how soon she needs to get her aneurism checked in her head. Has had a fall and just checking.  Please call her back at [phone].  Thanks, [employer]

## 2019-06-24 NOTE — Telephone Encounter (Signed)
Dr. Caryn Section, do you know what patient is referring to?

## 2019-06-24 NOTE — Telephone Encounter (Signed)
Don't know, no mention of aneurysm in her last head CT or MRI.

## 2019-06-25 NOTE — Telephone Encounter (Signed)
[id number] 

## 2019-06-25 NOTE — Telephone Encounter (Signed)
Patient advised. She states she had a head injury back in 2014 and had a CT in [address] that showed a small tiny anenursm. Patient was told that they aneurysm should be followed up on periodically to make sure it doesn't change in size. Patient says she fell last Friday, but didn't hit her head or lose conciousness. She has had some headaches that come and go. The headaches respond well to tylenol and have resolved. 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

## 2019-07-01 ENCOUNTER — Other Ambulatory Visit: Payer: Self-pay

## 2019-07-01 DIAGNOSIS — I10 Essential (primary) hypertension: Secondary | ICD-10-CM

## 2019-07-01 MED ORDER — METOPROLOL SUCCINATE ER 25 MG PO TB24: 12.5000 mg | ORAL_TABLET | Freq: Every day | ORAL | 3 refills | Status: DC

## 2019-07-09 ENCOUNTER — Telehealth: Payer: Self-pay | Admitting: Family Medicine

## 2019-07-09 NOTE — Telephone Encounter (Signed)
Pt is having severe back pain.   Been going to [facility] and not seeing the doctor due to a family emergency.  Wanting to discuss some other options like an epidural and or needing another referral.  Please advise.  Thanks, [employer]

## 2019-07-09 NOTE — Telephone Encounter (Signed)
Would you like me to schedule o.v for patient to return to clinic to discuss?KW

## 2019-07-11 NOTE — Telephone Encounter (Signed)
She have referral to pain clinic if she likes. They will evaluate and see if she is a candidate for epidural

## 2019-07-21 NOTE — Progress Notes (Signed)
Subjective:   [name] [name] is a 78 y.o. female who presents for Medicare Annual (Subsequent) preventive examination.    This visit is being conducted through telemedicine due to the COVID-19 pandemic. This patient has given me verbal consent via doximity to conduct this visit, patient states they are participating from their home address. Some vital signs may be absent or patient reported.    Patient identification: identified by name, DOB, and current address  Review of Systems:  N/A  Cardiac Risk Factors include: advanced age (>48men, >53 women);hypertension;obesity (BMI >30kg/m2)     Objective:     Vitals: There were no vitals taken for this visit.  There is no height or weight on file to calculate BMI. Unable to obtain vitals due to visit being conducted via telephonically.   Advanced Directives 07/22/2019 03/12/2019 03/11/2019 09/24/2018 08/27/2018 07/24/2018 07/17/2018  Does Patient Have a Medical Advance Directive? Yes No No No No No No  Type of Paramedic of [place];Living will - - - - - -  Copy of [place] in Chart? No - copy requested - - - - - -  Would patient like information on creating a medical advance directive? - No - Patient declined No - Patient declined Yes (MAU/Ambulatory/Procedural Areas - Information given) No - Patient declined Yes (MAU/Ambulatory/Procedural Areas - Information given) No - Patient declined    Tobacco Social History   Tobacco Use  Smoking Status Former Smoker  . Quit date: 04/11/1972  . Years since quitting: 47.3  Smokeless Tobacco Never Used     Counseling given: Not Answered   Clinical Intake:  Pre-visit preparation completed: Yes  Pain : No/denies pain Pain Score: 0-No pain     Nutritional Risks: None Diabetes: No  How often do you need to have someone help you when you read instructions, pamphlets, or other written materials from your doctor or pharmacy?: 1 - Never   Interpreter Needed?: No  Information entered by :: [facility], LPN  Past Medical History:  Diagnosis Date  . Arthritis    osteo; in B knees;   . Bladder disorder   . Chicken pox   . Chronic a-fib   . DVT (deep venous thrombosis) ([place])    H/O  . Dyspnea   . Dysrhythmia    a fib  . Edema   . Hay fever   . Heart murmur   . Hypertension    controlled  . Irregular heart beat   . Scoliosis   . Seizure ([place])    2 years ago knocked unconscious; hospitalized and diagnosed with seizures; no spells in last year;   . Sepsis ([place]) 03/11/2019  . Stroke [facility])    1 month ago (possible stroke)  . Unspecified atrial fibrillation [facility])    Past Surgical History:  Procedure Laterality Date  . bilateral wrist surgery  2003  . BREAST BIOPSY Left    stereo  . BREAST SURGERY    . CARDIAC ELECTROPHYSIOLOGY [place] AND ABLATION  2007   Done in [place], [place] for atrial flutter  . CATARACT EXTRACTION W/PHACO Right 06/01/2015   Procedure: CATARACT EXTRACTION PHACO AND INTRAOCULAR LENS PLACEMENT (IOC);  Surgeon: Birder Robson, MD;  Location: [facility] ORS;  Service: Ophthalmology;  Laterality: Right;  [place]: 01:02.0 AP%: 23.1 CDE: 14.33 Fluid lot# [id number]   . CATARACT EXTRACTION W/PHACO Left 06/22/2015   Procedure: CATARACT EXTRACTION PHACO AND INTRAOCULAR LENS PLACEMENT (IOC);  Surgeon: Birder Robson, MD;  Location: [facility] ORS;  Service:  Ophthalmology;  Laterality: Left;  [place]: 01:00.7 AP%: 23.1 CDE: 14.01 Lot # [id number]  . COLONOSCOPY    . DILATION AND CURETTAGE OF UTERUS    . JOINT REPLACEMENT    . KNEE ARTHROPLASTY Right 03/01/2016   Procedure: COMPUTER ASSISTED TOTAL KNEE ARTHROPLASTY;  Surgeon: Dereck Leep, MD;  Location: [facility] ORS;  Service: Orthopedics;  Laterality: Right;  . KNEE ARTHROPLASTY Left 04/23/2017   Procedure: COMPUTER ASSISTED TOTAL KNEE ARTHROPLASTY;  Surgeon: Dereck Leep, MD;  Location: [facility] ORS;  Service: Orthopedics;  Laterality: Left;  . LAMINECTOMY    . TONSILLECTOMY     . TOTAL KNEE ARTHROPLASTY Right    Family History  Problem Relation Age of Onset  . Stroke Mother   . Congestive Heart Failure Mother   . Atrial fibrillation Mother   . Melanoma Mother   . Stroke Maternal Grandmother   . Arthritis Paternal Grandmother        RA  . Diabetes Paternal Grandfather    Social History   Socioeconomic History  . Marital status: Divorced    Spouse name: Not on file  . Number of children: 2  . Years of education: Not on file  . Highest education level: Bachelor's degree (e.g., BA, AB, BS)  Occupational History  . Occupation: Retired  Scientific laboratory technician  . Financial resource strain: Not hard at all  . Food insecurity    Worry: Never true    Inability: Never true  . Transportation needs    Medical: No    Non-medical: No  Tobacco Use  . Smoking status: Former Smoker    Quit date: 04/11/1972    Years since quitting: 47.3  . Smokeless tobacco: Never Used  Substance and Sexual Activity  . Alcohol use: No    Alcohol/week: 0.0 standard drinks  . Drug use: No  . Sexual activity: Not on file  Lifestyle  . Physical activity    Days per week: 0 days    Minutes per session: 0 min  . Stress: Only a little  Relationships  . Social Herbalist on phone: Patient refused    Gets together: Patient refused    Attends religious service: Patient refused    Active member of club or organization: Patient refused    Attends meetings of clubs or organizations: Patient refused    Relationship status: Patient refused  Other Topics Concern  . Not on file  Social History Narrative   Retired, divorced, gets regular exercise (Curves- 3x weekly; walks dog)    Outpatient Encounter Medications as of 07/22/2019  Medication Sig  . acetaminophen (TYLENOL) 650 MG CR tablet Take 650 mg by mouth every 8 (eight) hours as needed for pain.  Marland Kitchen alendronate (FOSAMAX) 70 MG tablet Take 1 tablet (70 mg total) by mouth once a week.  . Artificial Tear Solution (SOOTHE XP  XTRA PROTECTION) SOLN Apply 1 drop to eye as needed (both eyes).   . B Complex-Biotin-FA (EQL B COMPLEX 100) TABS Take 0.4 mg tablet once daily by mouth  . Biotin 10 MG CAPS Take by mouth daily.  . bisacodyl (DULCOLAX) 5 MG EC tablet Take 5 mg by mouth daily as needed for moderate constipation.  . Calcium-Magnesium-Vitamin D (CALCIUM 1200+D3 PO) Take 1,200 mg by mouth daily. Citracal ER  . cholecalciferol (VITAMIN D) 1000 units tablet Take 1,000 Units by mouth daily.  . Coenzyme Q10 (CO Q-10) 100 MG CAPS Take 100 mg by mouth daily.   Marland Kitchen diltiazem (CARDIZEM  CD) 240 MG 24 hr capsule TAKE 1 CAPSULE BY MOUTH DAILY  . gabapentin (NEURONTIN) 300 MG capsule Take 1 capsule (300 mg total) by mouth 2 (two) times daily.  . Lactobacillus (ACIDOPHILUS PO) Take 1 tablet by mouth at bedtime.   Marland Kitchen MEGARED OMEGA-3 KRILL OIL PO Take 300 mg by mouth daily.   . Menthol, Topical Analgesic, (ICY HOT BACK EX) Apply topically as needed.  . metoprolol succinate (TOPROL-XL) 25 MG 24 hr tablet Take 0.5 tablets (12.5 mg total) by mouth daily.  Marland Kitchen oxybutynin (DITROPAN) 5 MG tablet TAKE 1 TABLET BY MOUTH 2 TIMES DAILY  . oxyCODONE-acetaminophen (PERCOCET) 7.5-325 MG tablet Take 1 tablet by mouth every 8 (eight) hours as needed for severe pain.  . TURMERIC PO Take 800 mg by mouth 2 (two) times daily.  Alveda Reasons 20 MG TABS tablet TAKE 1 TABLET BY MOUTH DAILY WITH SUPPER  . Zinc 50 MG CAPS Take by mouth daily.  Marland Kitchen lidocaine (ASPERCREME W/LIDOCAINE) 4 % cream Apply 1 application topically daily as needed. Apply to affected area for pain   No facility-administered encounter medications on file as of 07/22/2019.     Activities of Daily Living In your present state of health, do you have any difficulty performing the following activities: 07/22/2019 03/12/2019  Hearing? N N  Vision? N N  Difficulty concentrating or making decisions? N Y  Walking or climbing stairs? N Y  Dressing or bathing? N Y  Doing errands, shopping? Y N   Comment Does not drive. -  Preparing Food and eating ? Y -  Comment Does minimal cooking. -  Using the Toilet? N -  In the past six months, have you accidently leaked urine? N -  Do you have problems with loss of bowel control? N -  Managing your Medications? N -  Managing your Finances? N -  Housekeeping or managing your Housekeeping? Y -  Comment Has an aid that comes out to clean. -  Some recent data might be hidden    Patient Care Team: Birdie Sons, MD as PCP - General (Family Medicine) Vladimir Crofts, MD (Neurology) Minna Merritts, MD as Consulting Physician (Cardiology) Sharlet Salina, MD as Referring Physician (Physical Medicine and Rehabilitation) Birder Robson, MD as Referring Physician (Ophthalmology) Marry Guan Laurice Record, MD (Orthopedic Surgery) Juventino Slovak, PA-C    Assessment:   This is a routine wellness examination for Delinda.  Exercise Activities and Dietary recommendations Current Exercise Habits: Home exercise routine, Type of exercise: Other - see comments(Rides a nustep.), Time (Minutes): 10, Frequency (Times/Week): 4, Weekly Exercise (Minutes/Week): 40, Intensity: Mild, Exercise limited by: orthopedic condition(s)  Goals    . Eat more fruits and vegetables     Recommend eating more fruits in daily diet and substituting sweets for fruits.     Marland Kitchen LIFESTYLE - DECREASE FALLS RISK     Discussed way to prep home inside and out to help prevent future falls.        Fall Risk: Fall Risk  07/22/2019 07/17/2018 05/13/2018 03/04/2018 02/19/2018  Falls in the past year? 1 Yes No Yes No  Number falls in past yr: 0 2 or more - 1 -  Injury with Fall? 0 No - No -  Follow up Falls prevention discussed Falls prevention discussed - Falls prevention discussed -    FALL RISK PREVENTION PERTAINING TO THE HOME:  Any stairs in or around the home? No  If so, are there any without handrails? N/A  Home free of loose throw rugs in walkways, pet beds, electrical  cords, etc? Yes  Adequate lighting in your home to reduce risk of falls? Yes   ASSISTIVE DEVICES UTILIZED TO PREVENT FALLS:  Life alert? Yes  Use of a cane, walker or w/c? Yes  Grab bars in the bathroom? Yes  Shower chair or bench in shower? Yes  Elevated toilet seat or a handicapped toilet? Yes    TIMED UP AND GO:  Was the test performed? No .    Depression Screen PHQ 2/9 Scores 07/22/2019 08/27/2018 07/24/2018 07/17/2018  PHQ - 2 Score 0 0 0 0  PHQ- 9 Score - - - -     Cognitive Function: Declined today.      6CIT Screen 07/17/2018 07/04/2017  What Year? 0 points 0 points  What month? 0 points 0 points  What time? 0 points 0 points  Count back from 20 0 points 0 points  Months in reverse 0 points 2 points  Repeat phrase 0 points 2 points  Total Score 0 4    Immunization History  Administered Date(s) Administered  . Influenza Split 07/14/2009, 08/02/2010  . Influenza, High Dose Seasonal PF 07/24/2014, 09/22/2015, 08/02/2016, 07/04/2017, 07/17/2018  . Influenza,inj,Quad PF,6+ Mos 07/04/2013  . PPD Test 03/04/2016  . Pneumococcal Conjugate-13 07/24/2014  . Pneumococcal Polysaccharide-23 07/14/2009  . Td 06/16/2008  . Tdap 01/05/2011  . Zoster 11/29/2011    Qualifies for Shingles Vaccine? Yes  Zostavax completed 11/29/11. Due for Shingrix. Education has been provided regarding the importance of this vaccine. Pt has been advised to call insurance company to determine out of pocket expense. Advised may also receive vaccine at local pharmacy or Health Dept. Verbalized acceptance and understanding.  Tdap: Up to date  Flu Vaccine: Due for Flu vaccine. Does the patient want to receive this vaccine today?  No .   Pneumococcal Vaccine: Completed series  Screening Tests Health Maintenance  Topic Date Due  . COLONOSCOPY  01/16/2017  . INFLUENZA VACCINE  05/24/2019  . DEXA SCAN  09/21/2019  . TETANUS/TDAP  01/04/2021  . PNA vac Low Risk Adult  Completed    Cancer  Screenings:  Colorectal Screening: No longer required.   Mammogram: No longer required.   Bone Density: Completed 09/20/16. Results reflect OSTEOPOROSIS. Repeat every 3 years.   Lung Cancer Screening: (Low Dose CT Chest recommended if Age 43-80 years, 30 pack-year currently smoking OR have quit w/in 15years.) does not qualify.   Additional Screening:  Vision Screening: Recommended annual ophthalmology exams for early detection of glaucoma and other disorders of the eye.  Dental Screening: Recommended annual dental exams for proper oral hygiene  Community Resource Referral:  CRR required this visit?  No       Plan:  I have personally reviewed and addressed the Medicare Annual Wellness questionnaire and have noted the following in the patient's chart:  A. Medical and social history B. Use of alcohol, tobacco or illicit drugs  C. Current medications and supplements D. Functional ability and status E.  Nutritional status F.  Physical activity G. Advance directives H. List of other physicians I.  Hospitalizations, surgeries, and ER visits in previous 12 months J.  [place] such as hearing and vision if needed, cognitive and depression L. Referrals and appointments   In addition, I have reviewed and discussed with patient certain preventive protocols, quality metrics, and best practice recommendations. A written personalized care plan for preventive services as well as general preventive health  recommendations were provided to patient. Nurse Health Advisor  Signed,    Jamell Opfer [place], [place]  [id number] Nurse Health Advisor   Nurse Notes: Pt to speak with PCP about continuing to have colonoscopies. Pt needs a influenza vaccine at next in office visit.

## 2019-07-22 ENCOUNTER — Ambulatory Visit (INDEPENDENT_AMBULATORY_CARE_PROVIDER_SITE_OTHER): Payer: PPO

## 2019-07-22 ENCOUNTER — Other Ambulatory Visit: Payer: Self-pay

## 2019-07-22 DIAGNOSIS — Z0000 Encounter for general adult medical examination without abnormal findings: Secondary | ICD-10-CM

## 2019-07-22 NOTE — Patient Instructions (Signed)
[name] [name] , Thank you for taking time to come for your Medicare Wellness Visit. I appreciate your ongoing commitment to your health goals. Please review the following plan we discussed and let me know if I can assist you in the future.   Screening recommendations/referrals: Colonoscopy: No longer required.  Mammogram: No longer required.  Bone Density: Up to date, due 08/2019 Recommended yearly ophthalmology/optometry visit for glaucoma screening and checkup Recommended yearly dental visit for hygiene and checkup  Vaccinations: Influenza vaccine: Currently due Pneumococcal vaccine: Completed series Tdap vaccine: Up to date, due 12/2020 Shingles vaccine: Pt declines today.     Advanced directives: Please bring a copy of your POA (Power of Attorney) and/or Living Will to your next appointment.   Conditions/risks identified: Fall preventative discussed today.   Next appointment: 09/15/19 @ 9:40 AM with Dr Caryn Section.    Preventive Care 36 Years and Older, Female Preventive care refers to lifestyle choices and visits with your health care provider that can promote health and wellness. What does preventive care include?  A yearly physical exam. This is also called an annual well check.  Dental exams once or twice a year.  Routine eye exams. Ask your health care provider how often you should have your eyes checked.  Personal lifestyle choices, including:  Daily care of your teeth and gums.  Regular physical activity.  Eating a healthy diet.  Avoiding tobacco and drug use.  Limiting alcohol use.  Practicing safe sex.  Taking low-dose aspirin every day.  Taking vitamin and mineral supplements as recommended by your health care provider. What happens during an annual well check? The services and screenings done by your health care provider during your annual well check will depend on your age, overall health, lifestyle risk factors, and family history of disease.  Counseling  Your health care provider may ask you questions about your:  Alcohol use.  Tobacco use.  Drug use.  Emotional well-being.  Home and relationship well-being.  Sexual activity.  Eating habits.  History of falls.  Memory and ability to understand (cognition).  Work and work Statistician.  Reproductive health. Screening  You may have the following tests or measurements:  Height, weight, and BMI.  Blood pressure.  Lipid and cholesterol levels. These may be checked every 5 years, or more frequently if you are over 35 years old.  Skin check.  Lung cancer screening. You may have this screening every year starting at age 71 if you have a 30-pack-year history of smoking and currently smoke or have quit within the past 15 years.  Fecal occult blood test (FOBT) of the stool. You may have this test every year starting at age 52.  Flexible sigmoidoscopy or colonoscopy. You may have a sigmoidoscopy every 5 years or a colonoscopy every 10 years starting at age 2.  Hepatitis C blood test.  Hepatitis B blood test.  Sexually transmitted disease (STD) testing.  Diabetes screening. This is done by checking your blood sugar (glucose) after you have not eaten for a while (fasting). You may have this done every 1-3 years.  Bone density scan. This is done to screen for osteoporosis. You may have this done starting at age 35.  Mammogram. This may be done every 1-2 years. Talk to your health care provider about how often you should have regular mammograms. Talk with your health care provider about your test results, treatment options, and if necessary, the need for more tests. Vaccines  Your health care provider may recommend  certain vaccines, such as:  Influenza vaccine. This is recommended every year.  Tetanus, diphtheria, and acellular pertussis (Tdap, Td) vaccine. You may need a Td booster every 10 years.  Zoster vaccine. You may need this after age 1.   Pneumococcal 13-valent conjugate (PCV13) vaccine. One dose is recommended after age 33.  Pneumococcal polysaccharide (PPSV23) vaccine. One dose is recommended after age 47. Talk to your health care provider about which screenings and vaccines you need and how often you need them. This information is not intended to replace advice given to you by your health care provider. Make sure you discuss any questions you have with your health care provider. Document Released: 11/05/2015 Document Revised: 06/28/2016 Document Reviewed: 08/10/2015 Elsevier Interactive Patient Education  2017 [place] Prevention in the Home Falls can cause injuries. They can happen to people of all ages. There are many things you can do to make your home safe and to help prevent falls. What can I do on the outside of my home?  Regularly fix the edges of walkways and driveways and fix any cracks.  Remove anything that might make you trip as you walk through a door, such as a raised step or threshold.  Trim any bushes or trees on the path to your home.  Use bright outdoor lighting.  Clear any walking paths of anything that might make someone trip, such as rocks or tools.  Regularly check to see if handrails are loose or broken. Make sure that both sides of any steps have handrails.  Any raised decks and porches should have guardrails on the edges.  Have any leaves, snow, or ice cleared regularly.  Use sand or salt on walking paths during winter.  Clean up any spills in your garage right away. This includes oil or grease spills. What can I do in the bathroom?  Use night lights.  Install grab bars by the toilet and in the tub and shower. Do not use towel bars as grab bars.  Use non-skid mats or decals in the tub or shower.  If you need to sit down in the shower, use a plastic, non-slip stool.  Keep the floor dry. Clean up any water that spills on the floor as soon as it happens.  Remove soap  buildup in the tub or shower regularly.  Attach bath mats securely with double-sided non-slip rug tape.  Do not have throw rugs and other things on the floor that can make you trip. What can I do in the bedroom?  Use night lights.  Make sure that you have a light by your bed that is easy to reach.  Do not use any sheets or blankets that are too big for your bed. They should not hang down onto the floor.  Have a firm chair that has side arms. You can use this for support while you get dressed.  Do not have throw rugs and other things on the floor that can make you trip. What can I do in the kitchen?  Clean up any spills right away.  Avoid walking on wet floors.  Keep items that you use a lot in easy-to-reach places.  If you need to reach something above you, use a strong step stool that has a grab bar.  Keep electrical cords out of the way.  Do not use floor polish or wax that makes floors slippery. If you must use wax, use non-skid floor wax.  Do not have throw rugs and other  things on the floor that can make you trip. What can I do with my stairs?  Do not leave any items on the stairs.  Make sure that there are handrails on both sides of the stairs and use them. Fix handrails that are broken or loose. Make sure that handrails are as long as the stairways.  Check any carpeting to make sure that it is firmly attached to the stairs. Fix any carpet that is loose or worn.  Avoid having throw rugs at the top or bottom of the stairs. If you do have throw rugs, attach them to the floor with carpet tape.  Make sure that you have a light switch at the top of the stairs and the bottom of the stairs. If you do not have them, ask someone to add them for you. What else can I do to help prevent falls?  Wear shoes that:  Do not have high heels.  Have rubber bottoms.  Are comfortable and fit you well.  Are closed at the toe. Do not wear sandals.  If you use a stepladder:  Make  sure that it is fully opened. Do not climb a closed stepladder.  Make sure that both sides of the stepladder are locked into place.  Ask someone to hold it for you, if possible.  Clearly mark and make sure that you can see:  Any grab bars or handrails.  First and last steps.  Where the edge of each step is.  Use tools that help you move around (mobility aids) if they are needed. These include:  Canes.  Walkers.  Scooters.  Crutches.  Turn on the lights when you go into a dark area. Replace any light bulbs as soon as they burn out.  Set up your furniture so you have a clear path. Avoid moving your furniture around.  If any of your floors are uneven, fix them.  If there are any pets around you, be aware of where they are.  Review your medicines with your doctor. Some medicines can make you feel dizzy. This can increase your chance of falling. Ask your doctor what other things that you can do to help prevent falls. This information is not intended to replace advice given to you by your health care provider. Make sure you discuss any questions you have with your health care provider. Document Released: 08/05/2009 Document Revised: 03/16/2016 Document Reviewed: 11/13/2014 Elsevier Interactive Patient Education  2017 [employer].

## 2019-07-28 DIAGNOSIS — M5416 Radiculopathy, lumbar region: Secondary | ICD-10-CM | POA: Diagnosis not present

## 2019-07-28 DIAGNOSIS — M5136 Other intervertebral disc degeneration, lumbar region: Secondary | ICD-10-CM | POA: Diagnosis not present

## 2019-08-01 DIAGNOSIS — M47816 Spondylosis without myelopathy or radiculopathy, lumbar region: Secondary | ICD-10-CM | POA: Diagnosis not present

## 2019-08-04 ENCOUNTER — Telehealth: Payer: Self-pay | Admitting: *Deleted

## 2019-08-04 NOTE — Telephone Encounter (Signed)
FYI! Patient wanted to let Dr. Caryn Section know that she had x-rays done recently and will start getting injections for back pain.

## 2019-08-15 ENCOUNTER — Telehealth: Payer: Self-pay

## 2019-08-15 MED ORDER — AMOXICILLIN 500 MG PO CAPS: ORAL_CAPSULE | ORAL | 0 refills | Status: DC

## 2019-08-15 NOTE — Telephone Encounter (Signed)
Pt has a dentist apt on 08/19/2019.  She normally take an antibiotic before the appointment.  Please send to [facility].  Contact Number: [phone]  Thanks,   -Mickel Baas

## 2019-08-15 NOTE — Telephone Encounter (Signed)
done

## 2019-09-01 ENCOUNTER — Other Ambulatory Visit: Payer: Self-pay | Admitting: Family Medicine

## 2019-09-02 DIAGNOSIS — M5136 Other intervertebral disc degeneration, lumbar region: Secondary | ICD-10-CM | POA: Diagnosis not present

## 2019-09-02 DIAGNOSIS — M5416 Radiculopathy, lumbar region: Secondary | ICD-10-CM | POA: Diagnosis not present

## 2019-09-09 NOTE — Progress Notes (Signed)
Patient: [name] [name] Female    DOB: September 02, 1941   78 y.o.   MRN: [id number] Visit Date: 09/10/2019  Today's Provider: Lelon Huh, MD   Chief Complaint  Patient presents with  . Hypertension   Subjective:     HPI  Hypertension, follow-up:  BP Readings from Last 3 Encounters:  09/10/19 137/83  05/06/19 101/66  03/18/19 (!) 119/55    She was last seen for hypertension 4 months ago.  BP at that visit was 101/66. Management since that visit includes changing from Metoprolol Tartrate to Metoprolol Succinate for more stable blood pressure; also reduced to 1/2 tablet. She reports good compliance with treatment. She is not having side effects.  She is not exercising. She is not adherent to low salt diet.   Outside blood pressures are being checked at home. Readings are WNL per patient She is experiencing none.  Patient denies chest pain, chest pressure/discomfort, irregular heart beat and palpitations.   Cardiovascular risk factors include advanced age (older than 31 for men, 62 for women) and hypertension.  Use of agents associated with hypertension: none.     Weight trend: stable Wt Readings from Last 3 Encounters:  09/10/19 159 lb 12.8 oz (72.5 kg)  05/06/19 153 lb (69.4 kg)  03/18/19 149 lb 9.6 oz (67.9 kg)    Current diet: in general, a "healthy" diet    ------------------------------------------------------------------------  She also has history of seizure and continues to follow up semiannually with Dr. Manuella Ghazi. She reports no seizures this year.   Allergies  Allergen Reactions  . Aspirin Other (See Comments)    On xarelto     Current Outpatient Medications:  .  acetaminophen (TYLENOL) 650 MG CR tablet, Take 650 mg by mouth every 8 (eight) hours as needed for pain., Disp: , Rfl:  .  alendronate (FOSAMAX) 70 MG tablet, Take 1 tablet (70 mg total) by mouth once a week., Disp: 12 tablet, Rfl: 4 .  amoxicillin (AMOXIL) 500 MG capsule, Take 4  tablets one hour before procedure, Disp: 8 capsule, Rfl: 0 .  Artificial Tear Solution (SOOTHE XP XTRA PROTECTION) SOLN, Apply 1 drop to eye as needed (both eyes). , Disp: , Rfl:  .  B Complex-Biotin-FA (EQL B COMPLEX 100) TABS, Take 0.4 mg tablet once daily by mouth, Disp: , Rfl:  .  Biotin 10 MG CAPS, Take by mouth daily., Disp: , Rfl:  .  bisacodyl (DULCOLAX) 5 MG EC tablet, Take 5 mg by mouth daily as needed for moderate constipation., Disp: , Rfl:  .  Calcium-Magnesium-Vitamin D (CALCIUM 1200+D3 PO), Take 1,200 mg by mouth daily. Citracal ER, Disp: , Rfl:  .  cholecalciferol (VITAMIN D) 1000 units tablet, Take 1,000 Units by mouth daily., Disp: , Rfl:  .  Coenzyme Q10 (CO Q-10) 100 MG CAPS, Take 100 mg by mouth daily. , Disp: , Rfl:  .  diltiazem (CARDIZEM CD) 240 MG 24 hr capsule, TAKE 1 CAPSULE BY MOUTH DAILY, Disp: 90 capsule, Rfl: 4 .  gabapentin (NEURONTIN) 300 MG capsule, Take 1 capsule (300 mg total) by mouth 2 (two) times daily., Disp: 180 capsule, Rfl: 4 .  Lactobacillus (ACIDOPHILUS PO), Take 1 tablet by mouth at bedtime. , Disp: , Rfl:  .  lidocaine (ASPERCREME W/LIDOCAINE) 4 % cream, Apply 1 application topically daily as needed. Apply to affected area for pain, Disp: , Rfl:  .  MEGARED OMEGA-3 KRILL OIL PO, Take 300 mg by mouth daily. , Disp: ,  Rfl:  .  Menthol, Topical Analgesic, (ICY HOT BACK EX), Apply topically as needed., Disp: , Rfl:  .  metoprolol succinate (TOPROL-XL) 25 MG 24 hr tablet, Take 0.5 tablets (12.5 mg total) by mouth daily., Disp: 45 tablet, Rfl: 3 .  oxybutynin (DITROPAN) 5 MG tablet, TAKE 1 TABLET BY MOUTH TWICE A DAY, Disp: 180 tablet, Rfl: 4 .  oxyCODONE-acetaminophen (PERCOCET) 7.5-325 MG tablet, Take 1 tablet by mouth every 8 (eight) hours as needed for severe pain., Disp: 30 tablet, Rfl: 0 .  TURMERIC PO, Take 800 mg by mouth 2 (two) times daily., Disp: , Rfl:  .  XARELTO 20 MG TABS tablet, TAKE 1 TABLET BY MOUTH DAILY WITH SUPPER, Disp: 90 tablet, Rfl:  4 .  Zinc 50 MG CAPS, Take by mouth daily., Disp: , Rfl:   Review of Systems  Constitutional: Negative for appetite change, chills, fatigue and fever.  Respiratory: Negative for chest tightness and shortness of breath.   Cardiovascular: Negative for chest pain and palpitations.  Gastrointestinal: Negative for abdominal pain, nausea and vomiting.  Neurological: Negative for dizziness and weakness.    Social History   Tobacco Use  . Smoking status: Former Smoker    Quit date: 04/11/1972    Years since quitting: 47.4  . Smokeless tobacco: Never Used  Substance Use Topics  . Alcohol use: No    Alcohol/week: 0.0 standard drinks      Objective:   BP 137/83 (BP Location: Right Arm, Patient Position: Sitting, Cuff Size: Normal)   Pulse 80   Temp (!) 97.3 F (36.3 C) (Temporal)   Wt 159 lb 12.8 oz (72.5 kg)   SpO2 98%   BMI 29.23 kg/m  Vitals:   09/10/19 0930  BP: 137/83  Pulse: 80  Temp: (!) 97.3 F (36.3 C)  TempSrc: Temporal  SpO2: 98%  Weight: 159 lb 12.8 oz (72.5 kg)  Body mass index is 29.23 kg/m.   Physical Exam   General Appearance:    Well developed, well nourished female in no acute distress  Eyes:    PERRL, conjunctiva/corneas clear, EOM's intact       Lungs:     Clear to auscultation bilaterally, respirations unlabored  Heart:    Normal heart rate. Irregularly irregular rhythm. No murmurs, rubs, or gallops.   MS:   All extremities are intact.   Neurologic:   Awake, alert, oriented x 3. No apparent focal neurological           defect.          Assessment & Plan    1. HYPERTENSION, BENIGN Well controlled.  No issues with labile BP since change to metoprolol succinate. Continue current medications.    2. Spinal stenosis of lumbosacral region Pain well controlled. refill- oxyCODONE-acetaminophen (PERCOCET) 7.5-325 MG tablet; Take 1 tablet by mouth every 8 (eight) hours as needed for severe pain.  Dispense: 30 tablet; Refill: 0  3. Atrial fibrillation   (HCC) Continue NOAC, rate well controlled.   4. Seizures ([place]) Continues regular follow up with Dr. Manuella Ghazi. No seizures this year.   5. Need for influenza vaccination  - Flu Vaccine QUAD High Dose(Fluad)  The entirety of the information documented in the History of Present Illness, Review of Systems and Physical Exam were personally obtained by me. Portions of this information were initially documented by Idelle Jo, CMA and reviewed by me for thoroughness and accuracy.     Lelon Huh, MD  [facility]

## 2019-09-10 ENCOUNTER — Encounter: Payer: Self-pay | Admitting: Family Medicine

## 2019-09-10 ENCOUNTER — Other Ambulatory Visit: Payer: Self-pay

## 2019-09-10 ENCOUNTER — Ambulatory Visit (INDEPENDENT_AMBULATORY_CARE_PROVIDER_SITE_OTHER): Payer: PPO | Admitting: Family Medicine

## 2019-09-10 VITALS — BP 137/83 | HR 80 | Temp 97.3°F | Wt 159.8 lb

## 2019-09-10 DIAGNOSIS — M4807 Spinal stenosis, lumbosacral region: Secondary | ICD-10-CM | POA: Diagnosis not present

## 2019-09-10 DIAGNOSIS — Z23 Encounter for immunization: Secondary | ICD-10-CM

## 2019-09-10 DIAGNOSIS — I482 Chronic atrial fibrillation, unspecified: Secondary | ICD-10-CM | POA: Diagnosis not present

## 2019-09-10 DIAGNOSIS — R569 Unspecified convulsions: Secondary | ICD-10-CM

## 2019-09-10 DIAGNOSIS — I10 Essential (primary) hypertension: Secondary | ICD-10-CM

## 2019-09-10 MED ORDER — OXYCODONE-ACETAMINOPHEN 7.5-325 MG PO TABS: 1.0000 | ORAL_TABLET | Freq: Three times a day (TID) | ORAL | 0 refills | Status: DC | PRN

## 2019-09-11 DIAGNOSIS — M4807 Spinal stenosis, lumbosacral region: Secondary | ICD-10-CM | POA: Insufficient documentation

## 2019-09-15 ENCOUNTER — Ambulatory Visit: Payer: PPO | Admitting: Family Medicine

## 2019-09-23 DIAGNOSIS — M5416 Radiculopathy, lumbar region: Secondary | ICD-10-CM | POA: Diagnosis not present

## 2019-09-23 DIAGNOSIS — M5136 Other intervertebral disc degeneration, lumbar region: Secondary | ICD-10-CM | POA: Diagnosis not present

## 2019-09-23 DIAGNOSIS — M48062 Spinal stenosis, lumbar region with neurogenic claudication: Secondary | ICD-10-CM | POA: Diagnosis not present

## 2019-10-14 ENCOUNTER — Ambulatory Visit: Payer: Self-pay | Admitting: *Deleted

## 2019-10-14 NOTE — Telephone Encounter (Signed)
Pt is having pain in her back, right side area. Pt states it was in her left side for a while. Pt wants to know if she can take ibuprofen and alternate with tylenol. Pt already takes several medications, and wants to know if ok to take.   Call to patient- patient thinks she possible pulled a muscle- she is having pain LRQ of back. She has been using Tylenol which helps slightly and heat. Patient is having trouble moving around due to pain and she is using a walker for support. Patient does have urinary frequency and urgency- but she has history of bladder control problems and is on medication for that. No fever, respiratory symptoms- she exports no exposure.  Reason for Disposition . [1] SEVERE back pain (e.g., excruciating, unable to do any normal activities) AND [2] not improved 2 hours after pain medicine  Answer Assessment - Initial Assessment Questions 1. ONSET: "When did the pain begin?"      Yesterday- patient states she has pain at waist in back 2. LOCATION: "Where does it hurt?" (upper, mid or lower back)     Right at waist in back- lower back 3. SEVERITY: "How bad is the pain?"  (e.g., Scale 1-10; mild, moderate, or severe)   - MILD (1-3): doesn't interfere with normal activities    - MODERATE (4-7): interferes with normal activities or awakens from sleep    - SEVERE (8-10): excruciating pain, unable to do any normal activities      Jabbing pain- 9 4. PATTERN: "Is the pain constant?" (e.g., yes, no; constant, intermittent)      Constant- positional- walking makes it worse 5. RADIATION: "Does the pain shoot into your legs or elsewhere?"     no 6. CAUSE:  "What do you think is causing the back pain?"      Not sure 7. BACK OVERUSE:  "Any recent lifting of heavy objects, strenuous work or exercise?"     Moved a container- pushed 8. MEDICATIONS: "What have you taken so far for the pain?" (e.g., nothing, acetaminophen, NSAIDS)     Tylenol, sometimes oxycodone- does not help much 9.  NEUROLOGIC SYMPTOMS: "Do you have any weakness, numbness, or problems with bowel/bladder control?"     Sometimes patient has to hurry to bathroom in the evening 10. OTHER SYMPTOMS: "Do you have any other symptoms?" (e.g., fever, abdominal pain, burning with urination, blood in urine)       no 11. PREGNANCY: "Is there any chance you are pregnant?" (e.g., yes, no; LMP)       n/a  Protocols used: BACK PAIN-A-AH

## 2019-10-15 ENCOUNTER — Ambulatory Visit (INDEPENDENT_AMBULATORY_CARE_PROVIDER_SITE_OTHER): Payer: PPO | Admitting: Family Medicine

## 2019-10-15 ENCOUNTER — Other Ambulatory Visit: Payer: Self-pay

## 2019-10-15 ENCOUNTER — Ambulatory Visit
Admission: RE | Admit: 2019-10-15 | Discharge: 2019-10-15 | Disposition: A | Discharge status: Home / Self Care | Payer: PPO | Source: Ambulatory Visit | Attending: Family Medicine | Admitting: Family Medicine

## 2019-10-15 ENCOUNTER — Ambulatory Visit: Payer: Self-pay | Admitting: *Deleted

## 2019-10-15 ENCOUNTER — Telehealth: Payer: Self-pay

## 2019-10-15 ENCOUNTER — Encounter: Payer: Self-pay | Admitting: Family Medicine

## 2019-10-15 VITALS — BP 134/81 | HR 100 | Temp 96.6°F | Wt 163.4 lb

## 2019-10-15 DIAGNOSIS — G894 Chronic pain syndrome: Secondary | ICD-10-CM

## 2019-10-15 DIAGNOSIS — R109 Unspecified abdominal pain: Secondary | ICD-10-CM | POA: Insufficient documentation

## 2019-10-15 DIAGNOSIS — R4701 Aphasia: Secondary | ICD-10-CM

## 2019-10-15 DIAGNOSIS — M545 Low back pain: Secondary | ICD-10-CM | POA: Diagnosis not present

## 2019-10-15 DIAGNOSIS — R079 Chest pain, unspecified: Secondary | ICD-10-CM | POA: Diagnosis not present

## 2019-10-15 DIAGNOSIS — G8929 Other chronic pain: Secondary | ICD-10-CM | POA: Diagnosis not present

## 2019-10-15 DIAGNOSIS — I4891 Unspecified atrial fibrillation: Secondary | ICD-10-CM | POA: Diagnosis not present

## 2019-10-15 DIAGNOSIS — S2231XA Fracture of one rib, right side, initial encounter for closed fracture: Secondary | ICD-10-CM | POA: Diagnosis not present

## 2019-10-15 LAB — POCT URINALYSIS DIPSTICK
Bilirubin, UA: NEGATIVE
Blood, UA: NEGATIVE
Glucose, UA: NEGATIVE
Ketones, UA: NEGATIVE
Leukocytes, UA: NEGATIVE
Nitrite, UA: NEGATIVE
Protein, UA: POSITIVE — AB
Spec Grav, UA: >=1.03 — AB (ref 1.010–1.025)
Urobilinogen, UA: 0.2 E.U./dL
pH, UA: 6 (ref 5.0–8.0)

## 2019-10-15 NOTE — Telephone Encounter (Signed)
  She called in and was given her x-ray result regarding her rib injury that Dr. Caryn Section had left for her.  She is requesting something for pain.     See notes below for details.    She was agreeable to having someone call her back regarding pain medication.  I sent my notes to Dr. Maralyn Sago office high priority.  Reason for Disposition . [1] Caller requesting NON-URGENT health information AND [2] PCP's office is the best resource    Requesting pain medication for the rib fracture  Answer Assessment - Initial Assessment Questions 1. REASON FOR CALL or QUESTION: "What is your reason for calling today?" or "How can I best help you?" or "What question do you have that I can help answer?"     She called in and was given her x-ray results from Dr. Caryn Section regarding her rib.  She is c/o having a lot of pain over that area and wants to know if there is something Dr. Caryn Section can prescribe or recommend for the pain.   She is already using the heat he recommended.  She has been using Extra Strength Tylenol.  But it's not helping.   At times she has taken an Oxycodone with the Tylenol but it doesn't really help either.  I did education on getting too much Tylenol by taking the Oxycodone and Extra Strength Tylenol together.   She verbalized understanding not to take them together.   To take one or the other.  Protocols used: INFORMATION ONLY CALL-A-AH

## 2019-10-15 NOTE — Progress Notes (Signed)
Patient: [name] [name] Female    DOB: July 30, 1941   78 y.o.   MRN: [id number] Visit Date: 10/15/2019  Today's Provider: Lelon Huh, MD   Chief Complaint  Patient presents with  . Back Pain   Subjective:     Back Pain This is a new problem. The current episode started yesterday. The problem occurs constantly. The problem has been waxing and waning since onset. The pain is present in the lumbar spine. The quality of the pain is described as stabbing. The pain does not radiate. The pain is severe. The symptoms are aggravated by bending and position (deep breathes). Treatments tried: Tylenol. The treatment provided mild relief.      Allergies  Allergen Reactions  . Aspirin Other (See Comments)    On xarelto     Current Outpatient Medications:  .  acetaminophen (TYLENOL) 650 MG CR tablet, Take 650 mg by mouth every 8 (eight) hours as needed for pain., Disp: , Rfl:  .  alendronate (FOSAMAX) 70 MG tablet, Take 1 tablet (70 mg total) by mouth once a week., Disp: 12 tablet, Rfl: 4 .  amoxicillin (AMOXIL) 500 MG capsule, Take 4 tablets one hour before procedure, Disp: 8 capsule, Rfl: 0 .  Artificial Tear Solution (SOOTHE XP XTRA PROTECTION) SOLN, Apply 1 drop to eye as needed (both eyes). , Disp: , Rfl:  .  B Complex-Biotin-FA (EQL B COMPLEX 100) TABS, Take 0.4 mg tablet once daily by mouth, Disp: , Rfl:  .  Biotin 10 MG CAPS, Take by mouth daily., Disp: , Rfl:  .  bisacodyl (DULCOLAX) 5 MG EC tablet, Take 5 mg by mouth daily as needed for moderate constipation., Disp: , Rfl:  .  Calcium-Magnesium-Vitamin D (CALCIUM 1200+D3 PO), Take 1,200 mg by mouth daily. Citracal ER, Disp: , Rfl:  .  cholecalciferol (VITAMIN D) 1000 units tablet, Take 1,000 Units by mouth daily., Disp: , Rfl:  .  Coenzyme Q10 (CO Q-10) 100 MG CAPS, Take 100 mg by mouth daily. , Disp: , Rfl:  .  diltiazem (CARDIZEM CD) 240 MG 24 hr capsule, TAKE 1 CAPSULE BY MOUTH DAILY, Disp: 90 capsule, Rfl: 4 .   diltiazem (TIAZAC) 240 MG 24 hr capsule, Take by mouth., Disp: , Rfl:  .  gabapentin (NEURONTIN) 300 MG capsule, Take 1 capsule (300 mg total) by mouth 2 (two) times daily., Disp: 180 capsule, Rfl: 4 .  Lactobacillus (ACIDOPHILUS PO), Take 1 tablet by mouth at bedtime. , Disp: , Rfl:  .  lidocaine (ASPERCREME W/LIDOCAINE) 4 % cream, Apply 1 application topically daily as needed. Apply to affected area for pain, Disp: , Rfl:  .  MEGARED OMEGA-3 KRILL OIL PO, Take 300 mg by mouth daily. , Disp: , Rfl:  .  Menthol, Topical Analgesic, (ICY HOT BACK EX), Apply topically as needed., Disp: , Rfl:  .  metoprolol succinate (TOPROL-XL) 25 MG 24 hr tablet, Take 0.5 tablets (12.5 mg total) by mouth daily., Disp: 45 tablet, Rfl: 3 .  oxybutynin (DITROPAN) 5 MG tablet, TAKE 1 TABLET BY MOUTH TWICE A DAY, Disp: 180 tablet, Rfl: 4 .  oxyCODONE-acetaminophen (PERCOCET) 7.5-325 MG tablet, Take 1 tablet by mouth every 8 (eight) hours as needed for severe pain., Disp: 30 tablet, Rfl: 0 .  TURMERIC PO, Take 800 mg by mouth 2 (two) times daily., Disp: , Rfl:  .  XARELTO 20 MG TABS tablet, TAKE 1 TABLET BY MOUTH DAILY WITH SUPPER, Disp: 90 tablet, Rfl: 4 .  Zinc 50 MG CAPS, Take by mouth daily., Disp: , Rfl:   Review of Systems  Constitutional: Negative.   Respiratory: Negative.   Cardiovascular: Negative.   Musculoskeletal: Positive for back pain.    Social History   Tobacco Use  . Smoking status: Former Smoker    Quit date: 04/11/1972    Years since quitting: 47.5  . Smokeless tobacco: Never Used  Substance Use Topics  . Alcohol use: No    Alcohol/week: 0.0 standard drinks      Objective:   BP 134/81 (BP Location: Right Arm, Patient Position: Sitting, Cuff Size: Normal)   Pulse 100   Temp (!) 96.6 F (35.9 C) (Temporal)   Wt 163 lb 6.4 oz (74.1 kg)   SpO2 99%   BMI 29.89 kg/m   Physical Exam  General Appearance:    Well developed, well nourished female, alert, cooperative, in no acute distress   Eyes:    PERRL, conjunctiva/corneas clear, EOM's intact       Lungs:     Clear to auscultation bilaterally, respirations unlabored  Heart:    Tachycardic. Irregularly irregular rhythm. No murmurs, rubs, or gallops.   MS:   Abdomen:   bowel sounds present and normal in all 4 quadrants, soft, round or nontender. No CVA tenderness    Results for orders placed or performed in visit on 10/15/19  POCT Urinalysis Dipstick  Result Value Ref Range   Color, UA dasrk yellow    Clarity, UA hazy    Glucose, UA Negative Negative   Bilirubin, UA negative    Ketones, UA negative    Spec Grav, UA >=1.030 (A) 1.010 - 1.025   Blood, UA negative    pH, UA 6.0 5.0 - 8.0   Protein, UA Positive (A) Negative   Urobilinogen, UA 0.2 0.2 or 1.0 E.U./dL   Nitrite, UA negative    Leukocytes, UA Negative Negative   Appearance     Odor         Assessment & Plan    1. Acute right flank pain  - DG Ribs Unilateral Right; Future - DG Chest 2 View; Future  2. Chronic bilateral low back pain without sciatica   3. Chronic pain syndrome   4. Expressive aphasia   5. Atrial fibrillation with RVR (HCC)  - diltiazem (TIAZAC) 240 MG 24 hr capsule; Take by mouth.     Lelon Huh, MD  [facility]

## 2019-10-15 NOTE — Telephone Encounter (Signed)
-----   Message from Birdie Sons, MD sent at 10/15/2019 11:48 AM EST ----- Xray shows right rib fracture which is the cause of her pain. Need to apply heat to area for 8-10 minutes three times a day.  Also, please verify whether she is still taking Weekly Fosamax. It's critical that she takes this to help healing of fracture and to prevent more fractures in the future.

## 2019-10-15 NOTE — Telephone Encounter (Signed)
Tried calling patient. Left message to call back. Okay for PEC to advise patient of results.

## 2019-10-15 NOTE — Telephone Encounter (Signed)
FYI

## 2019-10-16 MED ORDER — TRAMADOL HCL 50 MG PO TABS: 50.0000 mg | ORAL_TABLET | Freq: Three times a day (TID) | ORAL | 0 refills | Status: AC | PRN

## 2019-10-16 NOTE — Addendum Note (Signed)
Addended by: Birdie Sons on: 10/16/2019 08:32 AM   Modules accepted: Orders

## 2019-10-16 NOTE — Telephone Encounter (Signed)
Have sent prescription for tramadole

## 2019-10-30 ENCOUNTER — Ambulatory Visit (INDEPENDENT_AMBULATORY_CARE_PROVIDER_SITE_OTHER): Payer: PPO | Admitting: Physician Assistant

## 2019-10-30 ENCOUNTER — Ambulatory Visit: Payer: Self-pay | Admitting: *Deleted

## 2019-10-30 DIAGNOSIS — M4807 Spinal stenosis, lumbosacral region: Secondary | ICD-10-CM

## 2019-10-30 DIAGNOSIS — S2231XD Fracture of one rib, right side, subsequent encounter for fracture with routine healing: Secondary | ICD-10-CM

## 2019-10-30 DIAGNOSIS — M5136 Other intervertebral disc degeneration, lumbar region: Secondary | ICD-10-CM | POA: Diagnosis not present

## 2019-10-30 DIAGNOSIS — M5416 Radiculopathy, lumbar region: Secondary | ICD-10-CM | POA: Diagnosis not present

## 2019-10-30 DIAGNOSIS — M4180 Other forms of scoliosis, site unspecified: Secondary | ICD-10-CM | POA: Diagnosis not present

## 2019-10-30 MED ORDER — TRAMADOL HCL 50 MG PO TABS: 50.0000 mg | ORAL_TABLET | Freq: Four times a day (QID) | ORAL | 0 refills | Status: DC | PRN

## 2019-10-30 NOTE — Telephone Encounter (Signed)
Per initial encounter, "Patient requesting call back from NT to discuss rib pain and what she is able to take OTC to help alleviate pain. Patient has been taking tramadol and tylenol and inquired if she was able to take this for extended amounts of time and if they're able to be taken together'; contacted pt regarding her concerns; she has issues with left and right side pain; she states that she takes her Fosamax every Monday; explained to pt based on that since this is an ongoing issue, a conversation with her provider is merited; she verbalized understanding, and requested a virtua appointment due to transportation issues; the pt sees Dr Caryn Section, [facility]; since the provider is out of the office, per Jiles Garter, the pt offered and accepted virtual appointment with Fenton Malling, 10/30/19 at 1520; will route to office for notification.

## 2019-10-30 NOTE — Progress Notes (Signed)
Patient: [name] [name] Female    DOB: 03/29/1941   79 y.o.   MRN: [id number] Visit Date: 10/30/2019  Today's Provider: Mar Daring, PA-C   No chief complaint on file.  Subjective:     HPI  [name] [name] is a 79 yr old female that presents today via telephone visit for acute on chronic back pain and rib pain. Was noted to have a right rib fracture on 10/15/19. She had acute onset of right side flank pain and xray was obtained showing the fracture. No known cause noted. Also has chronic back issues that have been more painful of recent also. Reports she was given Tramadol by Dr. Caryn Section on 10/15/19 and that this helped her pain the most.   Virtual Visit via Telephone Note  I connected with [place] on 10/30/19 at  3:20 PM EST by telephone and verified that I am speaking with the correct person using two identifiers.  Location: Patient: Home Provider: [facility]   I discussed the limitations, risks, security and privacy concerns of performing an evaluation and management service by telephone and the availability of in person appointments. I also discussed with the patient that there may be a patient responsible charge related to this service. The patient expressed understanding and agreed to proceed.   Allergies  Allergen Reactions  . Aspirin Other (See Comments)    On xarelto     Current Outpatient Medications:  .  acetaminophen (TYLENOL) 650 MG CR tablet, Take 650 mg by mouth every 8 (eight) hours as needed for pain., Disp: , Rfl:  .  alendronate (FOSAMAX) 70 MG tablet, Take 1 tablet (70 mg total) by mouth once a week., Disp: 12 tablet, Rfl: 4 .  amoxicillin (AMOXIL) 500 MG capsule, Take 4 tablets one hour before procedure, Disp: 8 capsule, Rfl: 0 .  Artificial Tear Solution (SOOTHE XP XTRA PROTECTION) SOLN, Apply 1 drop to eye as needed (both eyes). , Disp: , Rfl:  .  B Complex-Biotin-FA (EQL B COMPLEX 100) TABS, Take 0.4 mg tablet once daily by mouth,  Disp: , Rfl:  .  Biotin 10 MG CAPS, Take by mouth daily., Disp: , Rfl:  .  bisacodyl (DULCOLAX) 5 MG EC tablet, Take 5 mg by mouth daily as needed for moderate constipation., Disp: , Rfl:  .  Calcium-Magnesium-Vitamin D (CALCIUM 1200+D3 PO), Take 1,200 mg by mouth daily. Citracal ER, Disp: , Rfl:  .  cholecalciferol (VITAMIN D) 1000 units tablet, Take 1,000 Units by mouth daily., Disp: , Rfl:  .  Coenzyme Q10 (CO Q-10) 100 MG CAPS, Take 100 mg by mouth daily. , Disp: , Rfl:  .  diltiazem (CARDIZEM CD) 240 MG 24 hr capsule, TAKE 1 CAPSULE BY MOUTH DAILY, Disp: 90 capsule, Rfl: 4 .  diltiazem (TIAZAC) 240 MG 24 hr capsule, Take by mouth., Disp: , Rfl:  .  gabapentin (NEURONTIN) 300 MG capsule, Take 1 capsule (300 mg total) by mouth 2 (two) times daily., Disp: 180 capsule, Rfl: 4 .  Lactobacillus (ACIDOPHILUS PO), Take 1 tablet by mouth at bedtime. , Disp: , Rfl:  .  lidocaine (ASPERCREME W/LIDOCAINE) 4 % cream, Apply 1 application topically daily as needed. Apply to affected area for pain, Disp: , Rfl:  .  MEGARED OMEGA-3 KRILL OIL PO, Take 300 mg by mouth daily. , Disp: , Rfl:  .  Menthol, Topical Analgesic, (ICY HOT BACK EX), Apply topically as needed., Disp: , Rfl:  .  metoprolol  succinate (TOPROL-XL) 25 MG 24 hr tablet, Take 0.5 tablets (12.5 mg total) by mouth daily., Disp: 45 tablet, Rfl: 3 .  oxybutynin (DITROPAN) 5 MG tablet, TAKE 1 TABLET BY MOUTH TWICE A DAY, Disp: 180 tablet, Rfl: 4 .  oxyCODONE-acetaminophen (PERCOCET) 7.5-325 MG tablet, Take 1 tablet by mouth every 8 (eight) hours as needed for severe pain., Disp: 30 tablet, Rfl: 0 .  TURMERIC PO, Take 800 mg by mouth 2 (two) times daily., Disp: , Rfl:  .  XARELTO 20 MG TABS tablet, TAKE 1 TABLET BY MOUTH DAILY WITH SUPPER, Disp: 90 tablet, Rfl: 4 .  Zinc 50 MG CAPS, Take by mouth daily., Disp: , Rfl:   Review of Systems  Constitutional: Negative.   Respiratory: Negative.   Cardiovascular: Negative.   Musculoskeletal: Positive for  arthralgias (Rib pain), back pain, gait problem and myalgias.  Neurological: Positive for numbness. Negative for weakness.    Social History   Tobacco Use  . Smoking status: Former Smoker    Quit date: 04/11/1972    Years since quitting: 47.5  . Smokeless tobacco: Never Used  Substance Use Topics  . Alcohol use: No    Alcohol/week: 0.0 standard drinks      Objective:   There were no vitals taken for this visit. There were no vitals filed for this visit.There is no height or weight on file to calculate BMI.   Physical Exam Vitals reviewed.  Constitutional:      General: She is not in acute distress. Pulmonary:     Effort: No respiratory distress.  Neurological:     Mental Status: She is alert.      No results found for any visits on 10/30/19.     Assessment & Plan   1. Closed fracture of one rib of right side with routine healing, subsequent encounter Noted on 10/15/19. Still having pain. Tramadol refilled as below. May need to f/u with Dr. Caryn Section for chronic pain management if needed.  - traMADol (ULTRAM) 50 MG tablet; Take 1 tablet (50 mg total) by mouth every 6 (six) hours as needed.  Dispense: 32 tablet; Refill: 0  2. Spinal stenosis of lumbosacral region Chronic issue. May need long term pain management appointment with PCP.  - traMADol (ULTRAM) 50 MG tablet; Take 1 tablet (50 mg total) by mouth every 6 (six) hours as needed.  Dispense: 32 tablet; Refill: 0  3. Dextroscoliosis Chronic issue. - traMADol (ULTRAM) 50 MG tablet; Take 1 tablet (50 mg total) by mouth every 6 (six) hours as needed.  Dispense: 32 tablet; Refill: 0  4. Lumbar radiculopathy Chronic issue.  - traMADol (ULTRAM) 50 MG tablet; Take 1 tablet (50 mg total) by mouth every 6 (six) hours as needed.  Dispense: 32 tablet; Refill: 0  5. Lumbar degenerative disc disease Chronic issue.  - traMADol (ULTRAM) 50 MG tablet; Take 1 tablet (50 mg total) by mouth every 6 (six) hours as needed.  Dispense:  32 tablet; Refill: 0    I discussed the assessment and treatment plan with the patient. The patient was provided an opportunity to ask questions and all were answered. The patient agreed with the plan and demonstrated an understanding of the instructions.   The patient was advised to call back or seek an in-person evaluation if the symptoms worsen or if the condition fails to improve as anticipated.  I provided 29 minutes of non-face-to-face time during this encounter.      Mar Daring, PA-C  [facility]  

## 2019-11-03 DIAGNOSIS — M5136 Other intervertebral disc degeneration, lumbar region: Secondary | ICD-10-CM | POA: Diagnosis not present

## 2019-11-03 DIAGNOSIS — M48062 Spinal stenosis, lumbar region with neurogenic claudication: Secondary | ICD-10-CM | POA: Diagnosis not present

## 2019-11-03 DIAGNOSIS — M5416 Radiculopathy, lumbar region: Secondary | ICD-10-CM | POA: Diagnosis not present

## 2019-11-04 ENCOUNTER — Encounter: Payer: Self-pay | Admitting: Physician Assistant

## 2019-11-17 ENCOUNTER — Other Ambulatory Visit: Payer: Self-pay | Admitting: Family Medicine

## 2019-11-17 DIAGNOSIS — M5136 Other intervertebral disc degeneration, lumbar region: Secondary | ICD-10-CM

## 2019-11-17 DIAGNOSIS — M5416 Radiculopathy, lumbar region: Secondary | ICD-10-CM

## 2019-11-17 DIAGNOSIS — S2231XD Fracture of one rib, right side, subsequent encounter for fracture with routine healing: Secondary | ICD-10-CM

## 2019-11-17 DIAGNOSIS — M4807 Spinal stenosis, lumbosacral region: Secondary | ICD-10-CM

## 2019-11-17 DIAGNOSIS — M4180 Other forms of scoliosis, site unspecified: Secondary | ICD-10-CM

## 2019-11-17 NOTE — Telephone Encounter (Signed)
Medication Refill - Medication:traMADol (ULTRAM) 50 MG tablet [id number]      Preferred Pharmacy (with phone number or street name):  [facility] [address] [place], [place]  [place] [address]  Phone: [phone] Fax: [phone]     Agent: Please be advised that RX refills may take up to 3 business days. We ask that you follow-up with your pharmacy.

## 2019-11-18 ENCOUNTER — Telehealth: Payer: Self-pay

## 2019-11-18 ENCOUNTER — Other Ambulatory Visit: Payer: Self-pay

## 2019-11-18 MED ORDER — TRAMADOL HCL 50 MG PO TABS: 50.0000 mg | ORAL_TABLET | Freq: Four times a day (QID) | ORAL | 3 refills | Status: DC | PRN

## 2019-11-18 NOTE — Telephone Encounter (Signed)
Copied from [place]. Topic: General - Inquiry >> Nov 17, 2019  4:56 PM Alease Frame wrote: Reason for CRM: Patient would like a call back from Dr Sabino Snipes nurse .please advise

## 2019-11-18 NOTE — Telephone Encounter (Signed)
LMTCB

## 2019-11-18 NOTE — Telephone Encounter (Signed)
Patient called to follow up refill request for Tramadol. Please advise.

## 2019-11-25 ENCOUNTER — Telehealth: Payer: Self-pay

## 2019-11-25 NOTE — Telephone Encounter (Signed)
Copied from [place] [id number]. Topic: General - Other >> Nov 25, 2019  9:56 AM Leward Quan A wrote: Reason for CRM: Patient called stated that she was returning a call to [facility] but she was not sure. Patient can be reached at Ph# [phone]

## 2019-12-02 DIAGNOSIS — M5136 Other intervertebral disc degeneration, lumbar region: Secondary | ICD-10-CM | POA: Diagnosis not present

## 2019-12-02 DIAGNOSIS — M48062 Spinal stenosis, lumbar region with neurogenic claudication: Secondary | ICD-10-CM | POA: Diagnosis not present

## 2019-12-02 DIAGNOSIS — M5416 Radiculopathy, lumbar region: Secondary | ICD-10-CM | POA: Diagnosis not present

## 2019-12-03 ENCOUNTER — Emergency Department
Admission: EM | Admit: 2019-12-03 | Discharge: 2019-12-03 | Disposition: A | Discharge status: Home / Self Care | Payer: PPO | Attending: Emergency Medicine | Admitting: Emergency Medicine

## 2019-12-03 ENCOUNTER — Emergency Department: Payer: PPO

## 2019-12-03 ENCOUNTER — Other Ambulatory Visit: Payer: Self-pay

## 2019-12-03 DIAGNOSIS — R4781 Slurred speech: Secondary | ICD-10-CM | POA: Diagnosis not present

## 2019-12-03 DIAGNOSIS — W19XXXA Unspecified fall, initial encounter: Secondary | ICD-10-CM | POA: Diagnosis not present

## 2019-12-03 DIAGNOSIS — Z79899 Other long term (current) drug therapy: Secondary | ICD-10-CM | POA: Diagnosis not present

## 2019-12-03 DIAGNOSIS — Z7901 Long term (current) use of anticoagulants: Secondary | ICD-10-CM | POA: Diagnosis not present

## 2019-12-03 DIAGNOSIS — I10 Essential (primary) hypertension: Secondary | ICD-10-CM | POA: Diagnosis not present

## 2019-12-03 DIAGNOSIS — Z87891 Personal history of nicotine dependence: Secondary | ICD-10-CM | POA: Insufficient documentation

## 2019-12-03 DIAGNOSIS — R000 Tachycardia, unspecified: Secondary | ICD-10-CM | POA: Diagnosis not present

## 2019-12-03 DIAGNOSIS — R410 Disorientation, unspecified: Secondary | ICD-10-CM

## 2019-12-03 DIAGNOSIS — I4891 Unspecified atrial fibrillation: Secondary | ICD-10-CM | POA: Diagnosis not present

## 2019-12-03 DIAGNOSIS — S199XXA Unspecified injury of neck, initial encounter: Secondary | ICD-10-CM | POA: Diagnosis not present

## 2019-12-03 DIAGNOSIS — R0902 Hypoxemia: Secondary | ICD-10-CM | POA: Diagnosis not present

## 2019-12-03 DIAGNOSIS — Z8673 Personal history of transient ischemic attack (TIA), and cerebral infarction without residual deficits: Secondary | ICD-10-CM | POA: Diagnosis not present

## 2019-12-03 DIAGNOSIS — S0990XA Unspecified injury of head, initial encounter: Secondary | ICD-10-CM | POA: Diagnosis not present

## 2019-12-03 LAB — CBC WITH DIFFERENTIAL/PLATELET
Abs Immature Granulocytes: 0.05 10*3/uL (ref 0.00–0.07)
Basophils Absolute: 0 10*3/uL (ref 0.0–0.1)
Basophils Relative: 0 %
Eosinophils Absolute: 0 10*3/uL (ref 0.0–0.5)
Eosinophils Relative: 0 %
HCT: 44.9 % (ref 36.0–46.0)
Hemoglobin: 15.5 g/dL — ABNORMAL HIGH (ref 12.0–15.0)
Immature Granulocytes: 0 %
Lymphocytes Relative: 9 %
Lymphs Abs: 1.1 10*3/uL (ref 0.7–4.0)
MCH: 32.8 pg (ref 26.0–34.0)
MCHC: 34.5 g/dL (ref 30.0–36.0)
MCV: 95.1 fL (ref 80.0–100.0)
Monocytes Absolute: 0.8 10*3/uL (ref 0.1–1.0)
Monocytes Relative: 7 %
Neutro Abs: 9.4 10*3/uL — ABNORMAL HIGH (ref 1.7–7.7)
Neutrophils Relative %: 84 %
Platelets: 192 10*3/uL (ref 150–400)
RBC: 4.72 MIL/uL (ref 3.87–5.11)
RDW: 12.8 % (ref 11.5–15.5)
WBC: 11.3 10*3/uL — ABNORMAL HIGH (ref 4.0–10.5)
nRBC: 0 % (ref 0.0–0.2)

## 2019-12-03 LAB — URINALYSIS, COMPLETE (UACMP) WITH MICROSCOPIC
Bacteria, UA: NONE SEEN
Bilirubin Urine: NEGATIVE
Glucose, UA: NEGATIVE mg/dL
Ketones, ur: NEGATIVE mg/dL
Leukocytes,Ua: NEGATIVE
Nitrite: NEGATIVE
Protein, ur: 30 mg/dL — AB
Specific Gravity, Urine: 1.026 (ref 1.005–1.030)
pH: 6 (ref 5.0–8.0)

## 2019-12-03 LAB — BASIC METABOLIC PANEL
Anion gap: 8 (ref 5–15)
BUN: 21 mg/dL (ref 8–23)
CO2: 26 mmol/L (ref 22–32)
Calcium: 10.4 mg/dL — ABNORMAL HIGH (ref 8.9–10.3)
Chloride: 107 mmol/L (ref 98–111)
Creatinine, Ser: 0.67 mg/dL (ref 0.44–1.00)
GFR calc Af Amer: >60 mL/min (ref >60)
GFR calc non Af Amer: >60 mL/min (ref >60)
Glucose, Bld: 182 mg/dL — ABNORMAL HIGH (ref 70–99)
Potassium: 3.7 mmol/L (ref 3.5–5.1)
Sodium: 141 mmol/L (ref 135–145)

## 2019-12-03 LAB — TROPONIN I (HIGH SENSITIVITY)
Troponin I (High Sensitivity): 5 ng/L (ref <18)
Troponin I (High Sensitivity): 7 ng/L (ref <18)
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
## 2019-12-03 NOTE — ED Triage Notes (Signed)
Pt to ED via [facility] from [facility] independent living for chief complaint of fall on Monday. Pt reports hitting her head.  Pt confused and having difficulty answering questions, such as her birthdate, EMS reports this is new for her.  Pt takes blood thinners.  VSS Pt in NAD at this time

## 2019-12-03 NOTE — ED Notes (Signed)
Pt oriented at this time. Appears to be having difficulty "finding words".  Passed swallow screen.  Ok to eat and drink per Dr. Archie Balboa

## 2019-12-03 NOTE — ED Notes (Signed)
Pt to MRI at this time.

## 2019-12-03 NOTE — Discharge Instructions (Addendum)
Please seek medical attention for any high fevers, chest pain, shortness of breath, change in behavior, persistent vomiting, bloody stool or any other new or concerning symptoms.  

## 2019-12-09 ENCOUNTER — Telehealth: Payer: Self-pay | Admitting: Cardiovascular Disease

## 2019-12-09 NOTE — Telephone Encounter (Signed)
Patient states she fell last weekend and she thinks she had a stroke. States she did go to the Emergency Room, but a lot she does not remember. Patient is scheduled for 3/5 with Dr. Rockey Situ. States she was told that all of her test were normal.

## 2019-12-09 NOTE — Telephone Encounter (Signed)
Left voicemail message to call back  

## 2019-12-10 DIAGNOSIS — I480 Paroxysmal atrial fibrillation: Secondary | ICD-10-CM | POA: Diagnosis not present

## 2019-12-10 DIAGNOSIS — M4802 Spinal stenosis, cervical region: Secondary | ICD-10-CM | POA: Diagnosis not present

## 2019-12-10 DIAGNOSIS — R0683 Snoring: Secondary | ICD-10-CM | POA: Diagnosis not present

## 2019-12-10 DIAGNOSIS — I69319 Unspecified symptoms and signs involving cognitive functions following cerebral infarction: Secondary | ICD-10-CM | POA: Diagnosis not present

## 2019-12-10 DIAGNOSIS — I671 Cerebral aneurysm, nonruptured: Secondary | ICD-10-CM | POA: Diagnosis not present

## 2019-12-10 DIAGNOSIS — R569 Unspecified convulsions: Secondary | ICD-10-CM | POA: Diagnosis not present

## 2019-12-10 DIAGNOSIS — R2689 Other abnormalities of gait and mobility: Secondary | ICD-10-CM | POA: Diagnosis not present

## 2019-12-12 ENCOUNTER — Other Ambulatory Visit: Payer: Self-pay | Admitting: Neurology

## 2019-12-12 DIAGNOSIS — R2689 Other abnormalities of gait and mobility: Secondary | ICD-10-CM

## 2019-12-12 DIAGNOSIS — M4802 Spinal stenosis, cervical region: Secondary | ICD-10-CM

## 2019-12-17 ENCOUNTER — Other Ambulatory Visit: Payer: Self-pay | Admitting: Family Medicine

## 2019-12-17 NOTE — Telephone Encounter (Signed)
Request refills for gabapentin 300 mg. Prescription expired on 11/12/19

## 2019-12-18 NOTE — Telephone Encounter (Signed)
Spoke with patient and reviewed appointment information. She confirmed date, time, and location to arrive at [facility]. She was thankful for the call reminding her of this with no further questions at this time.

## 2019-12-23 DIAGNOSIS — M5136 Other intervertebral disc degeneration, lumbar region: Secondary | ICD-10-CM | POA: Diagnosis not present

## 2019-12-23 DIAGNOSIS — M5416 Radiculopathy, lumbar region: Secondary | ICD-10-CM | POA: Diagnosis not present

## 2019-12-24 ENCOUNTER — Ambulatory Visit
Admission: RE | Admit: 2019-12-24 | Discharge: 2019-12-24 | Disposition: A | Discharge status: Home / Self Care | Payer: PPO | Source: Ambulatory Visit | Attending: Neurology | Admitting: Neurology

## 2019-12-24 ENCOUNTER — Other Ambulatory Visit: Payer: Self-pay

## 2019-12-24 DIAGNOSIS — M4802 Spinal stenosis, cervical region: Secondary | ICD-10-CM | POA: Diagnosis not present

## 2019-12-24 DIAGNOSIS — R2689 Other abnormalities of gait and mobility: Secondary | ICD-10-CM

## 2019-12-24 DIAGNOSIS — R4701 Aphasia: Secondary | ICD-10-CM | POA: Diagnosis not present
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
## 2019-12-26 ENCOUNTER — Other Ambulatory Visit: Payer: Self-pay

## 2019-12-26 ENCOUNTER — Ambulatory Visit: Payer: PPO | Admitting: Cardiovascular Disease

## 2019-12-26 ENCOUNTER — Encounter: Payer: Self-pay | Admitting: Cardiovascular Disease

## 2019-12-26 VITALS — BP 120/72 | HR 97 | Ht 61.0 in | Wt 158.0 lb

## 2019-12-26 DIAGNOSIS — I10 Essential (primary) hypertension: Secondary | ICD-10-CM

## 2019-12-26 DIAGNOSIS — I483 Typical atrial flutter: Secondary | ICD-10-CM | POA: Diagnosis not present

## 2019-12-26 DIAGNOSIS — I482 Chronic atrial fibrillation, unspecified: Secondary | ICD-10-CM | POA: Diagnosis not present

## 2019-12-26 DIAGNOSIS — G459 Transient cerebral ischemic attack, unspecified: Secondary | ICD-10-CM | POA: Diagnosis not present

## 2019-12-26 DIAGNOSIS — I749 Embolism and thrombosis of unspecified artery: Secondary | ICD-10-CM

## 2019-12-26 MED ORDER — DILTIAZEM HCL ER COATED BEADS 240 MG PO CP24: 240.0000 mg | ORAL_CAPSULE | Freq: Every day | ORAL | 4 refills | Status: DC

## 2019-12-26 NOTE — Patient Instructions (Signed)

## 2020-01-08 DIAGNOSIS — R2689 Other abnormalities of gait and mobility: Secondary | ICD-10-CM | POA: Diagnosis not present

## 2020-01-08 DIAGNOSIS — M25552 Pain in left hip: Secondary | ICD-10-CM | POA: Diagnosis not present

## 2020-01-08 DIAGNOSIS — R2681 Unsteadiness on feet: Secondary | ICD-10-CM | POA: Diagnosis not present

## 2020-01-12 DIAGNOSIS — M25552 Pain in left hip: Secondary | ICD-10-CM | POA: Diagnosis not present

## 2020-01-12 DIAGNOSIS — R2689 Other abnormalities of gait and mobility: Secondary | ICD-10-CM | POA: Diagnosis not present

## 2020-01-12 DIAGNOSIS — R2681 Unsteadiness on feet: Secondary | ICD-10-CM | POA: Diagnosis not present

## 2020-01-15 DIAGNOSIS — R2681 Unsteadiness on feet: Secondary | ICD-10-CM | POA: Diagnosis not present

## 2020-01-15 DIAGNOSIS — R2689 Other abnormalities of gait and mobility: Secondary | ICD-10-CM | POA: Diagnosis not present

## 2020-01-15 DIAGNOSIS — M25552 Pain in left hip: Secondary | ICD-10-CM | POA: Diagnosis not present

## 2020-01-20 DIAGNOSIS — M25552 Pain in left hip: Secondary | ICD-10-CM | POA: Diagnosis not present

## 2020-01-20 DIAGNOSIS — R2689 Other abnormalities of gait and mobility: Secondary | ICD-10-CM | POA: Diagnosis not present

## 2020-01-20 DIAGNOSIS — R2681 Unsteadiness on feet: Secondary | ICD-10-CM | POA: Diagnosis not present

## 2020-01-22 DIAGNOSIS — R2689 Other abnormalities of gait and mobility: Secondary | ICD-10-CM | POA: Diagnosis not present

## 2020-01-22 DIAGNOSIS — M25552 Pain in left hip: Secondary | ICD-10-CM | POA: Diagnosis not present

## 2020-01-22 DIAGNOSIS — R2681 Unsteadiness on feet: Secondary | ICD-10-CM | POA: Diagnosis not present

## 2020-01-26 DIAGNOSIS — R2681 Unsteadiness on feet: Secondary | ICD-10-CM | POA: Diagnosis not present

## 2020-01-26 DIAGNOSIS — R2689 Other abnormalities of gait and mobility: Secondary | ICD-10-CM | POA: Diagnosis not present

## 2020-01-26 DIAGNOSIS — M25552 Pain in left hip: Secondary | ICD-10-CM | POA: Diagnosis not present

## 2020-01-29 DIAGNOSIS — R2681 Unsteadiness on feet: Secondary | ICD-10-CM | POA: Diagnosis not present

## 2020-01-29 DIAGNOSIS — M25552 Pain in left hip: Secondary | ICD-10-CM | POA: Diagnosis not present

## 2020-01-29 DIAGNOSIS — R2689 Other abnormalities of gait and mobility: Secondary | ICD-10-CM | POA: Diagnosis not present

## 2020-02-02 DIAGNOSIS — M25552 Pain in left hip: Secondary | ICD-10-CM | POA: Diagnosis not present

## 2020-02-02 DIAGNOSIS — R2689 Other abnormalities of gait and mobility: Secondary | ICD-10-CM | POA: Diagnosis not present

## 2020-02-02 DIAGNOSIS — R2681 Unsteadiness on feet: Secondary | ICD-10-CM | POA: Diagnosis not present

## 2020-02-05 DIAGNOSIS — R2689 Other abnormalities of gait and mobility: Secondary | ICD-10-CM | POA: Diagnosis not present

## 2020-02-05 DIAGNOSIS — M25552 Pain in left hip: Secondary | ICD-10-CM | POA: Diagnosis not present

## 2020-02-05 DIAGNOSIS — R2681 Unsteadiness on feet: Secondary | ICD-10-CM | POA: Diagnosis not present

## 2020-02-05 NOTE — Progress Notes (Signed)
Established patient visit     Patient: [name] [name]   DOB: September 22, 1941   79 y.o. Female  MRN: [id number] Visit Date: 02/06/2020  Today's healthcare provider: Trinna Post, PA-C  Subjective:    [name],acting as a scribe for Trinna Post, PA-C.,have documented all relevant documentation on the behalf of Trinna Post, PA-C,as directed by  Trinna Post, PA-C while in the presence of Trinna Post, PA-C.  Chief Complaint  Patient presents with  . Hip Pain   Hip Pain  The incident occurred more than 1 week ago. The pain is present in the left hip, left leg and left knee. The quality of the pain is described as stabbing and shooting. The pain is at a severity of 5/10 (walking 10 per pt). The pain is moderate. The pain has been constant since onset. Pertinent negatives include no inability to bear weight, muscle weakness, numbness or tingling. She reports no foreign bodies present. The symptoms are aggravated by movement and weight bearing. She has tried heat, non-weight bearing and acetaminophen for the symptoms. The treatment provided mild relief.        Medications: Outpatient Medications Prior to Visit  Medication Sig  . acetaminophen (TYLENOL) 650 MG CR tablet Take 650 mg by mouth every 8 (eight) hours as needed for pain.  Marland Kitchen alendronate (FOSAMAX) 70 MG tablet Take 1 tablet (70 mg total) by mouth once a week.  . Artificial Tear Solution (SOOTHE XP XTRA PROTECTION) SOLN Apply 1 drop to eye as needed (both eyes).   . B Complex-Biotin-FA (EQL B COMPLEX 100) TABS Take 0.4 mg tablet once daily by mouth  . Biotin 10 MG CAPS Take by mouth daily.  . bisacodyl (DULCOLAX) 5 MG EC tablet Take 5 mg by mouth daily as needed for moderate constipation.  . Calcium-Magnesium-Vitamin D (CALCIUM 1200+D3 PO) Take 1,200 mg by mouth daily. Citracal ER  . cholecalciferol (VITAMIN D) 1000 units tablet Take 1,000 Units by mouth daily.  . Coenzyme Q10 (CO Q-10) 100 MG  CAPS Take 100 mg by mouth daily.   Marland Kitchen diltiazem (CARDIZEM CD) 240 MG 24 hr capsule Take 1 capsule (240 mg total) by mouth daily.  Marland Kitchen gabapentin (NEURONTIN) 300 MG capsule TAKE ONE CAPSULE BY MOUTH TWICE A DAY  . Lactobacillus (ACIDOPHILUS PO) Take 1 tablet by mouth at bedtime.   . lidocaine (ASPERCREME W/LIDOCAINE) 4 % cream Apply 1 application topically daily as needed. Apply to affected area for pain  . MEGARED OMEGA-3 KRILL OIL PO Take 300 mg by mouth daily.   . Menthol, Topical Analgesic, (ICY HOT BACK EX) Apply topically as needed.  . metoprolol succinate (TOPROL-XL) 25 MG 24 hr tablet Take 0.5 tablets (12.5 mg total) by mouth daily.  Marland Kitchen oxybutynin (DITROPAN) 5 MG tablet TAKE 1 TABLET BY MOUTH TWICE A DAY  . traMADol (ULTRAM) 50 MG tablet Take 1 tablet (50 mg total) by mouth every 6 (six) hours as needed.  . TURMERIC PO Take 800 mg by mouth 2 (two) times daily.  Alveda Reasons 20 MG TABS tablet TAKE 1 TABLET BY MOUTH DAILY WITH SUPPER  . Zinc 50 MG CAPS Take by mouth daily.  Marland Kitchen amoxicillin (AMOXIL) 500 MG capsule Take 4 tablets one hour before procedure (Patient not taking: Reported on 02/06/2020)   No facility-administered medications prior to visit.    Review of Systems  Neurological: Negative for tingling and numbness.        Objective:  BP 118/68 (BP Location: Left Arm, Patient Position: Sitting, Cuff Size: Normal)   Pulse 76   Temp (!) 96.8 F (36 C) (Temporal)   Wt 162 lb 9.6 oz (73.8 kg)   SpO2 96%   BMI 30.72 kg/m    Physical Exam Constitutional:      Appearance: Normal appearance.  Cardiovascular:     Rate and Rhythm: Normal rate and regular rhythm.  Neurological:     General: No focal deficit present.     Mental Status: She is alert and oriented to person, place, and time.     Comments: 5/5 strength right lower extremity  4/5 left lower extermity  Psychiatric:        Mood and Affect: Mood normal.        Behavior: Behavior normal.       No results found for  any visits on 02/06/20.    Assessment & Plan:    1. Lumbar radiculopathy She is s/p laminectomy in 2020. She gets e.s.i. with [facility] and has completed physical therapy.  Can continue to  follow-up with Dr. Sharlet Salina  Return if symptoms worsen or fail to improve.     ITrinna Post, PA-C, have reviewed all documentation for this visit. The documentation on 02/06/20 for the exam, diagnosis, procedures, and orders are all accurate and complete.  I have spent 15 minutes with this patient, >50% of which was spent on counseling and coordination of care.    Paulene Floor  [facility] [phone] (phone) [phone] (fax)  [place]

## 2020-02-05 NOTE — Progress Notes (Signed)
Established patient visit     Patient: [name] [name]   DOB: [birth year]   79 y.o. Female  MRN: [id number] Visit Date: 02/06/2020  Today's healthcare provider: Trinna Post, PA-C  Subjective:    [name] [name],acting as a scribe for Trinna Post, PA-C.,have documented all relevant documentation on the behalf of Trinna Post, PA-C,as directed by  Trinna Post, PA-C while in the presence of Trinna Post, PA-C.  Chief Complaint  Patient presents with  . Hip Pain   Hip Pain  The incident occurred more than 1 week ago. The pain is present in the left hip, left leg and left knee. The quality of the pain is described as stabbing and shooting. The pain is at a severity of 5/10 (walking 10 per pt). The pain is moderate. The pain has been constant since onset. Pertinent negatives include no inability to bear weight, muscle weakness, numbness or tingling. She reports no foreign bodies present. The symptoms are aggravated by movement and weight bearing. She has tried heat, non-weight bearing and acetaminophen for the symptoms. The treatment provided mild relief.        Medications: Outpatient Medications Prior to Visit  Medication Sig  . acetaminophen (TYLENOL) 650 MG CR tablet Take 650 mg by mouth every 8 (eight) hours as needed for pain.  Marland Kitchen alendronate (FOSAMAX) 70 MG tablet Take 1 tablet (70 mg total) by mouth once a week.  . Artificial Tear Solution (SOOTHE XP XTRA PROTECTION) SOLN Apply 1 drop to eye as needed (both eyes).   . B Complex-Biotin-FA (EQL B COMPLEX 100) TABS Take 0.4 mg tablet once daily by mouth  . Biotin 10 MG CAPS Take by mouth daily.  . bisacodyl (DULCOLAX) 5 MG EC tablet Take 5 mg by mouth daily as needed for moderate constipation.  . Calcium-Magnesium-Vitamin D (CALCIUM 1200+D3 PO) Take 1,200 mg by mouth daily. Citracal ER  . cholecalciferol (VITAMIN D) 1000 units tablet Take 1,000 Units by mouth daily.  . Coenzyme Q10 (CO Q-10) 100 MG  CAPS Take 100 mg by mouth daily.   Marland Kitchen diltiazem (CARDIZEM CD) 240 MG 24 hr capsule Take 1 capsule (240 mg total) by mouth daily.  Marland Kitchen gabapentin (NEURONTIN) 300 MG capsule TAKE ONE CAPSULE BY MOUTH TWICE A DAY  . Lactobacillus (ACIDOPHILUS PO) Take 1 tablet by mouth at bedtime.   . lidocaine (ASPERCREME W/LIDOCAINE) 4 % cream Apply 1 application topically daily as needed. Apply to affected area for pain  . MEGARED OMEGA-3 KRILL OIL PO Take 300 mg by mouth daily.   . Menthol, Topical Analgesic, (ICY HOT BACK EX) Apply topically as needed.  . metoprolol succinate (TOPROL-XL) 25 MG 24 hr tablet Take 0.5 tablets (12.5 mg total) by mouth daily.  Marland Kitchen oxybutynin (DITROPAN) 5 MG tablet TAKE 1 TABLET BY MOUTH TWICE A DAY  . traMADol (ULTRAM) 50 MG tablet Take 1 tablet (50 mg total) by mouth every 6 (six) hours as needed.  . TURMERIC PO Take 800 mg by mouth 2 (two) times daily.  Alveda Reasons 20 MG TABS tablet TAKE 1 TABLET BY MOUTH DAILY WITH SUPPER  . Zinc 50 MG CAPS Take by mouth daily.  Marland Kitchen amoxicillin (AMOXIL) 500 MG capsule Take 4 tablets one hour before procedure (Patient not taking: Reported on 02/06/2020)   No facility-administered medications prior to visit.    Review of Systems  Neurological: Negative for tingling and numbness.        Objective:  BP 118/68 (BP Location: Left Arm, Patient Position: Sitting, Cuff Size: Normal)   Pulse 76   Temp (!) 96.8 F (36 C) (Temporal)   Wt 162 lb 9.6 oz (73.8 kg)   SpO2 96%   BMI 30.72 kg/m    Physical Exam Constitutional:      Appearance: Normal appearance.  Cardiovascular:     Rate and Rhythm: Normal rate and regular rhythm.  Neurological:     General: No focal deficit present.     Mental Status: She is alert and oriented to person, place, and time.     Comments: 5/5 strength right lower extremity  4/5 left lower extermity  Psychiatric:        Mood and Affect: Mood normal.        Behavior: Behavior normal.       No results found for  any visits on 02/06/20.    Assessment & Plan:    1. Lumbar radiculopathy She is s/p laminectomy in 2020. She gets e.s.i. with [facility] and has completed physical therapy.  Can continue to  follow-up with Dr. Sharlet Salina  Return if symptoms worsen or fail to improve.     ITrinna Post, PA-C, have reviewed all documentation for this visit. The documentation on 02/06/20 for the exam, diagnosis, procedures, and orders are all accurate and complete.  I have spent 15 minutes with this patient, >50% of which was spent on counseling and coordination of care.    Paulene Floor  [facility] [phone] (phone) [phone] (fax)  [place]

## 2020-02-06 ENCOUNTER — Ambulatory Visit (INDEPENDENT_AMBULATORY_CARE_PROVIDER_SITE_OTHER): Payer: PPO | Admitting: Physician Assistant

## 2020-02-06 ENCOUNTER — Encounter: Payer: Self-pay | Admitting: Physician Assistant

## 2020-02-06 ENCOUNTER — Other Ambulatory Visit: Payer: Self-pay

## 2020-02-06 VITALS — BP 118/68 | HR 76 | Temp 96.8°F | Wt 162.6 lb

## 2020-02-06 DIAGNOSIS — M5416 Radiculopathy, lumbar region: Secondary | ICD-10-CM

## 2020-02-06 NOTE — Patient Instructions (Signed)
Radicular Pain Radicular pain is a type of pain that spreads from your back or neck along a spinal nerve. Spinal nerves are nerves that leave the spinal cord and go to the muscles. Radicular pain is sometimes called radiculopathy, radiculitis, or a pinched nerve. When you have this type of pain, you may also have weakness, numbness, or tingling in the area of your body that is supplied by the nerve. The pain may feel sharp and burning. Depending on which spinal nerve is affected, the pain may occur in the:  Neck area (cervical radicular pain). You may also feel pain, numbness, weakness, or tingling in the arms.  Mid-spine area (thoracic radicular pain). You would feel this pain in the back and chest. This type is rare.  Lower back area (lumbar radicular pain). You would feel this pain as low back pain. You may feel pain, numbness, weakness, or tingling in the buttocks or legs. Sciatica is a type of lumbar radicular pain that shoots down the back of the leg. Radicular pain occurs when one of the spinal nerves becomes irritated or squeezed (compressed). It is often caused by something pushing on a spinal nerve, such as one of the bones of the spine (vertebrae) or one of the round cushions between vertebrae (intervertebral disks). This can result from:  An injury.  Wear and tear or aging of a disk.  The growth of a bone spur that pushes on the nerve. Radicular pain often goes away when you follow instructions from your health care provider for relieving pain at home. Follow these instructions at home: Managing pain      If directed, put ice on the affected area: ? Put ice in a plastic bag. ? Place a towel between your skin and the bag. ? Leave the ice on for 20 minutes, 2-3 times a day.  If directed, apply heat to the affected area as often as told by your health care provider. Use the heat source that your health care provider recommends, such as a moist heat pack or a heating pad. ? Place  a towel between your skin and the heat source. ? Leave the heat on for 20-30 minutes. ? Remove the heat if your skin turns bright red. This is especially important if you are unable to feel pain, heat, or cold. You may have a greater risk of getting burned. Activity   Do not sit or rest in bed for long periods of time.  Try to stay as active as possible. Ask your health care provider what type of exercise or activity is best for you.  Avoid activities that make your pain worse, such as bending and lifting.  Do not lift anything that is heavier than 10 lb (4.5 kg), or the limit that you are told, until your health care provider says that it is safe.  Practice using proper technique when lifting items. Proper lifting technique involves bending your knees and rising up.  Do strength and range-of-motion exercises only as told by your health care provider or physical therapist. General instructions  Take over-the-counter and prescription medicines only as told by your health care provider.  Pay attention to any changes in your symptoms.  Keep all follow-up visits as told by your health care provider. This is important. ? Your health care provider may send you to a physical therapist to help with this pain. Contact a health care provider if:  Your pain and other symptoms get worse.  Your pain medicine is not   helping.  Your pain has not improved after a few weeks of home care.  You have a fever. Get help right away if:  You have severe pain, weakness, or numbness.  You have difficulty with bladder or bowel control. Summary  Radicular pain is a type of pain that spreads from your back or neck along a spinal nerve.  When you have radicular pain, you may also have weakness, numbness, or tingling in the area of your body that is supplied by the nerve.  The pain may feel sharp or burning.  Radicular pain may be treated with ice, heat, medicines, or physical therapy. This  information is not intended to replace advice given to you by your health care provider. Make sure you discuss any questions you have with your health care provider. Document Revised: 04/23/2018 Document Reviewed: 04/23/2018 Elsevier Patient Education  2020 Elsevier Inc.  

## 2020-02-23 ENCOUNTER — Other Ambulatory Visit: Payer: Self-pay | Admitting: Family Medicine

## 2020-03-02 DIAGNOSIS — M48062 Spinal stenosis, lumbar region with neurogenic claudication: Secondary | ICD-10-CM | POA: Diagnosis not present

## 2020-03-02 DIAGNOSIS — M5416 Radiculopathy, lumbar region: Secondary | ICD-10-CM | POA: Diagnosis not present

## 2020-03-02 DIAGNOSIS — M5136 Other intervertebral disc degeneration, lumbar region: Secondary | ICD-10-CM | POA: Diagnosis not present

## 2020-03-08 NOTE — Progress Notes (Signed)
Established patient visit   Patient: [name] [name]   DOB: October 12, 1941   79 y.o. Female  MRN: [id number] Visit Date: 03/09/2020  Today's healthcare provider: Lelon Huh, MD   Chief Complaint  Patient presents with  . Hypertension  . Atrial Fibrillation  . Chronic pain   Subjective    HPI Hypertension, follow-up  BP Readings from Last 3 Encounters:  03/09/20 120/73  02/06/20 118/68  12/26/19 120/72   Wt Readings from Last 3 Encounters:  03/09/20 163 lb 12.8 oz (74.3 kg)  02/06/20 162 lb 9.6 oz (73.8 kg)  12/26/19 158 lb (71.7 kg)     She was last seen for hypertension 6 months ago.  BP at that visit was 137/83. Management since that visit includes continuing same medication.  She reports good compliance with treatment. She is not having side effects.  She is following a Regular diet. She is not exercising. She does not smoke.  Use of agents associated with hypertension: none.   Outside blood pressures are not being checked at home. Symptoms: No chest pain No chest pressure  No palpitations No syncope  No dyspnea No orthopnea  No paroxysmal nocturnal dyspnea No lower extremity edema   Pertinent labs: Lab Results  Component Value Date   CHOL 143 03/12/2019   HDL 73 03/12/2019   LDLCALC 67 03/12/2019   TRIG 13 03/12/2019   CHOLHDL 2.0 03/12/2019   Lab Results  Component Value Date   NA 141 12/03/2019   K 3.7 12/03/2019   CREATININE 0.67 12/03/2019   GFRNONAA >60 12/03/2019   GFRAA >60 12/03/2019   GLUCOSE 182 (H) 12/03/2019     The ASCVD Risk score (Goff DC Jr., et al., 2013) failed to calculate for the following reasons:   The patient has a prior MI or stroke diagnosis   --------------------------------------------------------------------------------------------------- Follow up for Chronic pain:  The patient was last seen for this 4 months ago. Changes made at last visit include none.  She reports good compliance with treatment.  She feels that condition is Worse. She is not having side effects.  Had epidural by Dr. Sharlet Salina on 03/01/2020 but states continues to have severe left lower back pain radiating into left leg. Has tried oxycodone and tramadol which hasn't worked for her. Takes occasional tylenol.  -----------------------------------------------------------------------------------------  Follow up for Atrial Fibrillation:  The patient was last seen for this 4 months ago. Changes made at last visit include none.  She reports good compliance with treatment. She feels that condition is stable. She is not having side effects.   -----------------------------------------------------------------------------------------   Medications: Outpatient Medications Prior to Visit  Medication Sig  . acetaminophen (TYLENOL) 650 MG CR tablet Take 650 mg by mouth every 8 (eight) hours as needed for pain.  Marland Kitchen alendronate (FOSAMAX) 70 MG tablet TAKE 1 TABLET BY MOUTH WEEKLY IN THE MORNING WITH A FULL GLASS OF WATER. DO NOT EAT, DRINK OR LIE DOWN FOR 30 MIN.  Marland Kitchen Artificial Tear Solution (SOOTHE XP XTRA PROTECTION) SOLN Apply 1 drop to eye as needed (both eyes).   . B Complex-Biotin-FA (EQL B COMPLEX 100) TABS Take 0.4 mg tablet once daily by mouth  . Biotin 10 MG CAPS Take by mouth daily.  . bisacodyl (DULCOLAX) 5 MG EC tablet Take 5 mg by mouth daily as needed for moderate constipation.  . Calcium-Magnesium-Vitamin D (CALCIUM 1200+D3 PO) Take 1,200 mg by mouth daily. Citracal ER  . cholecalciferol (VITAMIN D) 1000 units tablet Take 1,000 Units  by mouth daily.  . Coenzyme Q10 (CO Q-10) 100 MG CAPS Take 100 mg by mouth daily.   Marland Kitchen diltiazem (CARDIZEM CD) 240 MG 24 hr capsule Take 1 capsule (240 mg total) by mouth daily.  Marland Kitchen gabapentin (NEURONTIN) 300 MG capsule TAKE ONE CAPSULE BY MOUTH TWICE A DAY  . Lactobacillus (ACIDOPHILUS PO) Take 1 tablet by mouth at bedtime.   . lidocaine (ASPERCREME W/LIDOCAINE) 4 % cream Apply 1  application topically daily as needed. Apply to affected area for pain  . MEGARED OMEGA-3 KRILL OIL PO Take 300 mg by mouth daily.   . Menthol, Topical Analgesic, (ICY HOT BACK EX) Apply topically as needed.  . metoprolol succinate (TOPROL-XL) 25 MG 24 hr tablet Take 0.5 tablets (12.5 mg total) by mouth daily.  Marland Kitchen oxybutynin (DITROPAN) 5 MG tablet TAKE 1 TABLET BY MOUTH TWICE A DAY  . traMADol (ULTRAM) 50 MG tablet Take 1 tablet (50 mg total) by mouth every 6 (six) hours as needed.  . TURMERIC PO Take 800 mg by mouth 2 (two) times daily.  Alveda Reasons 20 MG TABS tablet TAKE 1 TABLET BY MOUTH DAILY WITH SUPPER  . Zinc 50 MG CAPS Take by mouth daily.  Marland Kitchen amoxicillin (AMOXIL) 500 MG capsule Take 4 tablets one hour before procedure (Patient not taking: Reported on 02/06/2020)   No facility-administered medications prior to visit.    Review of Systems  Constitutional: Negative for appetite change, chills, fatigue and fever.  Respiratory: Negative for chest tightness and shortness of breath.   Cardiovascular: Negative for chest pain and palpitations.  Gastrointestinal: Negative for abdominal pain, nausea and vomiting.  Musculoskeletal: Positive for arthralgias.  Neurological: Negative for dizziness and weakness.       Objective    BP 120/73 (BP Location: Right Arm, Patient Position: Sitting, Cuff Size: Normal)   Pulse 63   Temp (!) 96.6 F (35.9 C) (Temporal)   Wt 163 lb 12.8 oz (74.3 kg)   BMI 30.95 kg/m     Physical Exam   General: Appearance:    Obese female in no acute distress  Eyes:    PERRL, conjunctiva/corneas clear, EOM's intact       Lungs:     Clear to auscultation bilaterally, respirations unlabored  Heart:    Normal heart rate. Irregularly irregular rhythm. No murmurs, rubs, or gallops.   MS:   All extremities are intact.   Neurologic:   Awake, alert, oriented x 3. No apparent focal neurological           defect.        No results found for any visits on 03/09/20.   Assessment & Plan     1. Chronic pain syndrome   2. Chronic left-sided low back pain with left-sided sciatica No significant improvement with recent steroid injection by patient report. Minimal relief from tramadol. She would like to try a different pain medication, prescription hydrocodone/apap 7.5/325  3. HYPERTENSION, BENIGN Fairly well controlled.  - Lipid panel  4. Atrial fibrillation with RVR ([place]) Doing well, rate well controlled, on Xarelto. Continue routine cardiology follow up .   5. Osteoporosis, unspecified osteoporosis type, unspecified pathological fracture presence Due for - DG Bone Density [place]; Future  6. Hyperglycemia  - Hemoglobin A1c  7. Atrial fibrillation, chronic   No follow-ups on file.      The entirety of the information documented in the History of Present Illness, Review of Systems and Physical Exam were personally obtained by me. Portions of  this information were initially documented by the CMA and reviewed by me for thoroughness and accuracy.      Lelon Huh, MD  [facility] [phone] (phone) [phone] (fax)  [place]

## 2020-03-09 ENCOUNTER — Encounter: Payer: Self-pay | Admitting: Family Medicine

## 2020-03-09 ENCOUNTER — Other Ambulatory Visit: Payer: Self-pay

## 2020-03-09 ENCOUNTER — Ambulatory Visit (INDEPENDENT_AMBULATORY_CARE_PROVIDER_SITE_OTHER): Payer: PPO | Admitting: Family Medicine

## 2020-03-09 ENCOUNTER — Ambulatory Visit: Payer: Self-pay | Admitting: Family Medicine

## 2020-03-09 VITALS — BP 120/73 | HR 63 | Temp 96.6°F | Wt 163.8 lb

## 2020-03-09 DIAGNOSIS — R739 Hyperglycemia, unspecified: Secondary | ICD-10-CM

## 2020-03-09 DIAGNOSIS — M810 Age-related osteoporosis without current pathological fracture: Secondary | ICD-10-CM

## 2020-03-09 DIAGNOSIS — M5442 Lumbago with sciatica, left side: Secondary | ICD-10-CM

## 2020-03-09 DIAGNOSIS — G894 Chronic pain syndrome: Secondary | ICD-10-CM | POA: Diagnosis not present

## 2020-03-09 DIAGNOSIS — G8929 Other chronic pain: Secondary | ICD-10-CM

## 2020-03-09 DIAGNOSIS — I482 Chronic atrial fibrillation, unspecified: Secondary | ICD-10-CM

## 2020-03-09 DIAGNOSIS — I10 Essential (primary) hypertension: Secondary | ICD-10-CM | POA: Diagnosis not present

## 2020-03-09 MED ORDER — HYDROCODONE-ACETAMINOPHEN 7.5-325 MG PO TABS: 1.0000 | ORAL_TABLET | Freq: Four times a day (QID) | ORAL | 0 refills | Status: AC | PRN

## 2020-03-10 LAB — HEMOGLOBIN A1C
Est. average glucose Bld gHb Est-mCnc: 111 mg/dL
Hgb A1c MFr Bld: 5.5 % (ref 4.8–5.6)

## 2020-03-10 LAB — LIPID PANEL
Chol/HDL Ratio: 2.5 ratio (ref 0.0–4.4)
Cholesterol, Total: 188 mg/dL (ref 100–199)
HDL: 75 mg/dL (ref >39)
LDL Chol Calc (NIH): 91 mg/dL (ref 0–99)
Triglycerides: 129 mg/dL (ref 0–149)
VLDL Cholesterol Cal: 22 mg/dL (ref 5–40)

## 2020-03-11 ENCOUNTER — Telehealth: Payer: Self-pay

## 2020-03-11 NOTE — Telephone Encounter (Signed)
-----   Message from Birdie Sons, MD sent at 03/11/2020  3:00 PM EDT ----- Cholesterol is up a little bit to 188. A1c is good at 5.5. Continue current medications.  Try to cut back on saturated fats. Schedule follow up for blood pressure and diabetes 6 months.

## 2020-03-11 NOTE — Telephone Encounter (Signed)
Attempted to contact patient, no answer left a voicemail. Okay for PEC to advise patient and schedule follow up appointment.

## 2020-03-12 NOTE — Telephone Encounter (Signed)
Attempted to contact pt; left message on voicemail. 

## 2020-03-12 NOTE — Telephone Encounter (Signed)
Pt given result per Dr Caryn Section; pt offered and accepted follow up appt 09/13/20 at 1040;she verbalized understanding; will route to office for notification.[name] [name]

## 2020-03-15 DIAGNOSIS — M5136 Other intervertebral disc degeneration, lumbar region: Secondary | ICD-10-CM | POA: Diagnosis not present

## 2020-03-28 DIAGNOSIS — G4733 Obstructive sleep apnea (adult) (pediatric): Secondary | ICD-10-CM | POA: Diagnosis not present

## 2020-03-31 DIAGNOSIS — H26493 Other secondary cataract, bilateral: Secondary | ICD-10-CM | POA: Diagnosis not present

## 2020-04-09 DIAGNOSIS — M47816 Spondylosis without myelopathy or radiculopathy, lumbar region: Secondary | ICD-10-CM | POA: Diagnosis not present

## 2020-04-22 DIAGNOSIS — H26491 Other secondary cataract, right eye: Secondary | ICD-10-CM | POA: Diagnosis not present

## 2020-04-22 DIAGNOSIS — H26492 Other secondary cataract, left eye: Secondary | ICD-10-CM | POA: Diagnosis not present

## 2020-04-30 ENCOUNTER — Other Ambulatory Visit: Payer: Self-pay | Admitting: Family Medicine

## 2020-04-30 DIAGNOSIS — M5136 Other intervertebral disc degeneration, lumbar region: Secondary | ICD-10-CM | POA: Diagnosis not present

## 2020-05-18 ENCOUNTER — Other Ambulatory Visit: Payer: Self-pay

## 2020-05-27 ENCOUNTER — Telehealth: Payer: Self-pay | Admitting: Family Medicine

## 2020-05-27 DIAGNOSIS — M25559 Pain in unspecified hip: Secondary | ICD-10-CM

## 2020-05-27 DIAGNOSIS — G8929 Other chronic pain: Secondary | ICD-10-CM

## 2020-05-27 NOTE — Telephone Encounter (Signed)
Pt called and is requesting to have lidocaine patches sent in for her due to her pain. Please advise.      [facility] [address] [place], [place] - [place] [address]  Phone: [phone] Fax: [phone]  Hours: Not open 24 hours

## 2020-05-28 MED ORDER — LIDOCAINE 5 % EX PTCH: 1.0000 | MEDICATED_PATCH | CUTANEOUS | 5 refills | Status: AC

## 2020-05-28 NOTE — Telephone Encounter (Signed)
Patient states she's been experiencing ongoing left side hip, back and knee pain and would like PCP to prescribe lidocaine patches. Advised patient PCP is in clinic but will follow up.

## 2020-06-08 ENCOUNTER — Telehealth: Payer: Self-pay | Admitting: Family Medicine

## 2020-06-08 DIAGNOSIS — Z1231 Encounter for screening mammogram for malignant neoplasm of breast: Secondary | ICD-10-CM

## 2020-06-08 DIAGNOSIS — M47816 Spondylosis without myelopathy or radiculopathy, lumbar region: Secondary | ICD-10-CM | POA: Diagnosis not present

## 2020-06-08 DIAGNOSIS — M810 Age-related osteoporosis without current pathological fracture: Secondary | ICD-10-CM

## 2020-06-08 NOTE — Telephone Encounter (Signed)
Pt would like an order to get her mammogram and her bone density done at [facility]. She would like this done on same day. Order that is in chart for bone density is about to expire so she will need a new one

## 2020-06-09 NOTE — Telephone Encounter (Signed)
BMD and mammogram ordered.

## 2020-06-21 DIAGNOSIS — M48062 Spinal stenosis, lumbar region with neurogenic claudication: Secondary | ICD-10-CM | POA: Diagnosis not present

## 2020-06-21 DIAGNOSIS — M47816 Spondylosis without myelopathy or radiculopathy, lumbar region: Secondary | ICD-10-CM | POA: Diagnosis not present

## 2020-06-21 DIAGNOSIS — M5416 Radiculopathy, lumbar region: Secondary | ICD-10-CM | POA: Diagnosis not present

## 2020-06-21 DIAGNOSIS — M5136 Other intervertebral disc degeneration, lumbar region: Secondary | ICD-10-CM | POA: Diagnosis not present

## 2020-06-29 NOTE — Addendum Note (Signed)
Addended by: Wilburt Finlay on: 06/29/2020 10:06 AM   Modules accepted: Orders

## 2020-06-29 NOTE — Telephone Encounter (Signed)
Order placed. Thanks.

## 2020-06-29 NOTE — Telephone Encounter (Signed)
I will need an order for bilateral mammogram TOMO,Thanks

## 2020-07-14 NOTE — Telephone Encounter (Signed)
The order that was put in is for people with no insurance. Can you change it to [id number]

## 2020-07-15 ENCOUNTER — Other Ambulatory Visit: Payer: Self-pay | Admitting: Family Medicine

## 2020-07-15 DIAGNOSIS — I10 Essential (primary) hypertension: Secondary | ICD-10-CM

## 2020-07-19 NOTE — Addendum Note (Signed)
Addended by: Gerald Stabs on: 07/19/2020 10:35 AM   Modules accepted: Orders

## 2020-07-19 NOTE — Telephone Encounter (Signed)
Correct order placed for patient.

## 2020-07-20 DIAGNOSIS — M48062 Spinal stenosis, lumbar region with neurogenic claudication: Secondary | ICD-10-CM | POA: Diagnosis not present

## 2020-07-20 DIAGNOSIS — M5136 Other intervertebral disc degeneration, lumbar region: Secondary | ICD-10-CM | POA: Diagnosis not present

## 2020-07-20 DIAGNOSIS — M5416 Radiculopathy, lumbar region: Secondary | ICD-10-CM | POA: Diagnosis not present

## 2020-08-03 ENCOUNTER — Emergency Department: Payer: PPO

## 2020-08-03 ENCOUNTER — Other Ambulatory Visit: Payer: Self-pay

## 2020-08-03 ENCOUNTER — Emergency Department
Admission: EM | Admit: 2020-08-03 | Discharge: 2020-08-03 | Disposition: A | Discharge status: Home / Self Care | Payer: PPO | Attending: Emergency Medicine | Admitting: Emergency Medicine

## 2020-08-03 DIAGNOSIS — Z043 Encounter for examination and observation following other accident: Secondary | ICD-10-CM | POA: Diagnosis not present

## 2020-08-03 DIAGNOSIS — Y9301 Activity, walking, marching and hiking: Secondary | ICD-10-CM | POA: Insufficient documentation

## 2020-08-03 DIAGNOSIS — M542 Cervicalgia: Secondary | ICD-10-CM | POA: Diagnosis not present

## 2020-08-03 DIAGNOSIS — R0902 Hypoxemia: Secondary | ICD-10-CM | POA: Diagnosis not present

## 2020-08-03 DIAGNOSIS — R000 Tachycardia, unspecified: Secondary | ICD-10-CM | POA: Diagnosis not present

## 2020-08-03 DIAGNOSIS — Z87891 Personal history of nicotine dependence: Secondary | ICD-10-CM | POA: Diagnosis not present

## 2020-08-03 DIAGNOSIS — S52501D Unspecified fracture of the lower end of right radius, subsequent encounter for closed fracture with routine healing: Secondary | ICD-10-CM | POA: Diagnosis not present

## 2020-08-03 DIAGNOSIS — S0993XA Unspecified injury of face, initial encounter: Secondary | ICD-10-CM | POA: Diagnosis not present

## 2020-08-03 DIAGNOSIS — Z79899 Other long term (current) drug therapy: Secondary | ICD-10-CM | POA: Insufficient documentation

## 2020-08-03 DIAGNOSIS — S0990XA Unspecified injury of head, initial encounter: Secondary | ICD-10-CM | POA: Diagnosis present

## 2020-08-03 DIAGNOSIS — R0789 Other chest pain: Secondary | ICD-10-CM | POA: Diagnosis not present

## 2020-08-03 DIAGNOSIS — W228XXA Striking against or struck by other objects, initial encounter: Secondary | ICD-10-CM | POA: Insufficient documentation

## 2020-08-03 DIAGNOSIS — Z96653 Presence of artificial knee joint, bilateral: Secondary | ICD-10-CM | POA: Insufficient documentation

## 2020-08-03 DIAGNOSIS — S01511A Laceration without foreign body of lip, initial encounter: Secondary | ICD-10-CM | POA: Diagnosis not present

## 2020-08-03 DIAGNOSIS — I10 Essential (primary) hypertension: Secondary | ICD-10-CM | POA: Diagnosis not present

## 2020-08-03 DIAGNOSIS — Z7901 Long term (current) use of anticoagulants: Secondary | ICD-10-CM | POA: Insufficient documentation

## 2020-08-03 DIAGNOSIS — R079 Chest pain, unspecified: Secondary | ICD-10-CM | POA: Diagnosis not present

## 2020-08-03 DIAGNOSIS — Y92480 Sidewalk as the place of occurrence of the external cause: Secondary | ICD-10-CM | POA: Diagnosis not present

## 2020-08-03 DIAGNOSIS — S62014A Nondisplaced fracture of distal pole of navicular [scaphoid] bone of right wrist, initial encounter for closed fracture: Secondary | ICD-10-CM | POA: Diagnosis not present

## 2020-08-03 DIAGNOSIS — M25511 Pain in right shoulder: Secondary | ICD-10-CM | POA: Diagnosis not present

## 2020-08-03 DIAGNOSIS — R52 Pain, unspecified: Secondary | ICD-10-CM

## 2020-08-03 DIAGNOSIS — W19XXXA Unspecified fall, initial encounter: Secondary | ICD-10-CM

## 2020-08-03 DIAGNOSIS — S62001A Unspecified fracture of navicular [scaphoid] bone of right wrist, initial encounter for closed fracture: Secondary | ICD-10-CM

## 2020-08-03 LAB — BASIC METABOLIC PANEL
Anion gap: 10 (ref 5–15)
BUN: 21 mg/dL (ref 8–23)
CO2: 23 mmol/L (ref 22–32)
Calcium: 10.2 mg/dL (ref 8.9–10.3)
Chloride: 106 mmol/L (ref 98–111)
Creatinine, Ser: 0.86 mg/dL (ref 0.44–1.00)
GFR, Estimated: >60 mL/min (ref >60)
Glucose, Bld: 196 mg/dL — ABNORMAL HIGH (ref 70–99)
Potassium: 4 mmol/L (ref 3.5–5.1)
Sodium: 139 mmol/L (ref 135–145)

## 2020-08-03 LAB — PROTIME-INR
INR: 2.5 — ABNORMAL HIGH (ref 0.8–1.2)
Prothrombin Time: 25.9 seconds — ABNORMAL HIGH (ref 11.4–15.2)

## 2020-08-03 LAB — TROPONIN I (HIGH SENSITIVITY)
Troponin I (High Sensitivity): 5 ng/L (ref <18)
Troponin I (High Sensitivity): 6 ng/L (ref <18)

## 2020-08-03 LAB — CBC
HCT: 44.1 % (ref 36.0–46.0)
Hemoglobin: 14.9 g/dL (ref 12.0–15.0)
MCH: 32.9 pg (ref 26.0–34.0)
MCHC: 33.8 g/dL (ref 30.0–36.0)
MCV: 97.4 fL (ref 80.0–100.0)
Platelets: 171 10*3/uL (ref 150–400)
RBC: 4.53 MIL/uL (ref 3.87–5.11)
RDW: 13.1 % (ref 11.5–15.5)
WBC: 9.4 10*3/uL (ref 4.0–10.5)
nRBC: 0 % (ref 0.0–0.2)

## 2020-08-03 MED ORDER — LIDOCAINE-EPINEPHRINE 2 %-1:100000 IJ SOLN
20.0000 mL | Freq: Once | INTRAMUSCULAR | Status: AC
  Administered 2020-08-03: 20 mL
  Filled 2020-08-03: qty 1

## 2020-08-03 MED ORDER — OXYCODONE-ACETAMINOPHEN 5-325 MG PO TABS
1.0000 | ORAL_TABLET | Freq: Once | ORAL | Status: AC
  Administered 2020-08-03: 1 via ORAL
  Filled 2020-08-03: qty 1

## 2020-08-03 MED ORDER — LIDOCAINE 5 % EX PTCH
1.0000 | MEDICATED_PATCH | CUTANEOUS | Status: DC
  Filled 2020-08-03: qty 1

## 2020-08-03 NOTE — ED Triage Notes (Addendum)
Pt comes via EMS from home with c/o fall onto sidewalk. Pt states she just fell and unsure why. Pt has laceration noted to top lip. Pt states she is on blood thinners.  Pt states she fell forward and hit her face. Pt states pain in chest and to right arm, neck and shoulder.  Pt is in neck brace and bandage over top lip controlling bleeding at this time

## 2020-08-03 NOTE — ED Notes (Signed)
Pt to ED via EMS for fall this morning. States unsure how she fell, landed on front of face on sidewalk. Blood noted to top lip with swelling.  Clear speech. Alert and oriented.  Bruising noted to right wrist.  Takes xarelto daily.

## 2020-08-03 NOTE — ED Provider Notes (Signed)
[facility] Emergency Department Provider Note   ____________________________________________   First MD Initiated Contact with Patient 08/03/20 1355     (approximate)  I have reviewed the triage vital signs and the nursing notes.   HISTORY  Chief Complaint Fall    HPI [name] [name] is a 79 y.o. female with possible history of hypertension, stroke, DVT, and chronic A. fib on Xarelto who presents to the ED following fall.  Patient reports that she was walking outside on the sidewalk earlier this morning when she fell and struck her face.  She is not sure exactly what caused her to fall but thinks she might of tripped on her shoe.  She denies losing consciousness after hitting her head, currently denies any headache or neck pain.  She does not complain of pain to her right anterior chest, right wrist, and upper lip where she has a laceration.  She denies any numbness or weakness following the fall.        Past Medical History:  Diagnosis Date  . Arthritis    osteo; in B knees;   . Bladder disorder   . Chicken pox   . Chronic a-fib ([place])   . DVT (deep venous thrombosis) ([place])    H/O  . Dyspnea   . Dysrhythmia    a fib  . Edema   . Hay fever   . Heart murmur   . Hypertension    controlled  . Irregular heart beat   . Scoliosis   . Seizure ([place])    2 years ago knocked unconscious; hospitalized and diagnosed with seizures; no spells in last year;   . Sepsis ([place]) 03/11/2019  . Stroke [facility])    1 month ago (possible stroke)  . Unspecified atrial fibrillation [facility])     Patient Active Problem List   Diagnosis Date Noted  . Spinal stenosis of lumbosacral region 09/11/2019  . Expressive aphasia 03/11/2019  . Irritable bladder 09/12/2018  . Lumbar radiculopathy 05/13/2018  . Chronic pain syndrome 05/13/2018  . Lumbar degenerative disc disease 05/13/2018  . Osteoporosis 08/02/2016  . History of colon polyps 08/02/2016  . Low back pain  06/20/2016  . S/P total knee arthroplasty 03/01/2016  . Knee osteoarthritis 02/16/2016  . Weakness 09/27/2015  . 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 History of CVA (cerebrovascular accident) 02/16/2014  . Seizures ([place]) 02/16/2014  . Chronic leg pain 02/16/2014  . Fatigue 07/02/2013  . HYPERTENSION, BENIGN 12/07/2010  . Atrial fibrillation, chronic 06/29/2009    Past Surgical History:  Procedure Laterality Date  . bilateral wrist surgery  2003  . BREAST BIOPSY Left    stereo  . BREAST SURGERY    . CARDIAC ELECTROPHYSIOLOGY [place] AND ABLATION  2007   Done in [place], [place] for atrial flutter  . CATARACT EXTRACTION W/PHACO Right 06/01/2015   Procedure: CATARACT EXTRACTION PHACO AND INTRAOCULAR LENS PLACEMENT (IOC);  Surgeon: Birder Robson, MD;  Location: [facility] ORS;  Service: Ophthalmology;  Laterality: Right;  [place]: 01:02.0 AP%: 23.1 CDE: 14.33 Fluid lot# [id number]   . CATARACT EXTRACTION W/PHACO Left 06/22/2015   Procedure: CATARACT EXTRACTION PHACO AND INTRAOCULAR LENS PLACEMENT (IOC);  Surgeon: Birder Robson, MD;  Location: [facility] ORS;  Service: Ophthalmology;  Laterality: Left;  [place]: 01:00.7 AP%: 23.1 CDE: 14.01 Lot # [id number]  . COLONOSCOPY    . DILATION AND CURETTAGE OF UTERUS    . JOINT REPLACEMENT    . KNEE ARTHROPLASTY Right 03/01/2016  Procedure: COMPUTER ASSISTED TOTAL KNEE ARTHROPLASTY;  Surgeon: Dereck Leep, MD;  Location: [facility] ORS;  Service: Orthopedics;  Laterality: Right;  . KNEE ARTHROPLASTY Left 04/23/2017   Procedure: COMPUTER ASSISTED TOTAL KNEE ARTHROPLASTY;  Surgeon: Dereck Leep, MD;  Location: [facility] ORS;  Service: Orthopedics;  Laterality: Left;  . LAMINECTOMY    . TONSILLECTOMY    . TOTAL KNEE ARTHROPLASTY Right     Prior to Admission medications   Medication Sig Start Date End Date Taking? Authorizing Provider  acetaminophen (TYLENOL) 650 MG CR tablet Take 650 mg by mouth every 8 (eight) hours as needed for pain.     [provider]  alendronate (FOSAMAX) 70 MG tablet TAKE 1 TABLET BY MOUTH WEEKLY IN THE MORNING WITH A FULL GLASS OF WATER. DO NOT EAT, DRINK OR LIE DOWN FOR 30 MIN. 02/23/20   Birdie Sons, MD  amoxicillin (AMOXIL) 500 MG capsule Take 4 tablets one hour before procedure Patient not taking: Reported on 02/06/2020 08/15/19   Birdie Sons, MD  Artificial Tear Solution (SOOTHE XP XTRA PROTECTION) SOLN Apply 1 drop to eye as needed (both eyes).     [provider]  B Complex-Biotin-FA (EQL B COMPLEX 100) TABS Take 0.4 mg tablet once daily by mouth    [provider]  Biotin 10 MG CAPS Take by mouth daily.    [provider]  bisacodyl (DULCOLAX) 5 MG EC tablet Take 5 mg by mouth daily as needed for moderate constipation.    [provider]  Calcium-Magnesium-Vitamin D (CALCIUM 1200+D3 PO) Take 1,200 mg by mouth daily. Citracal ER    [provider]  cholecalciferol (VITAMIN D) 1000 units tablet Take 1,000 Units by mouth daily.    [provider]  Coenzyme Q10 (CO Q-10) 100 MG CAPS Take 100 mg by mouth daily.     [provider]  diltiazem (CARDIZEM CD) 240 MG 24 hr capsule Take 1 capsule (240 mg total) by mouth daily. 12/26/19   Minna Merritts, MD  gabapentin (NEURONTIN) 300 MG capsule TAKE ONE CAPSULE BY MOUTH TWICE A DAY 12/17/19   Birdie Sons, MD  Lactobacillus (ACIDOPHILUS PO) Take 1 tablet by mouth at bedtime.     [provider]  lidocaine (LIDODERM) 5 % Place 1 patch onto the skin daily. Remove & Discard patch within 12 hours 05/28/20   Birdie Sons, MD  MEGARED OMEGA-3 KRILL OIL PO Take 300 mg by mouth daily.     [provider]  Menthol, Topical Analgesic, (ICY HOT BACK EX) Apply topically as needed.    [provider]  metoprolol succinate (TOPROL-XL) 25 MG 24 hr tablet TAKE ONE-HALF TABLET BY MOUTH DAILY 07/15/20   Birdie Sons, MD  oxybutynin (DITROPAN) 5 MG tablet TAKE 1  TABLET BY MOUTH TWICE A DAY 09/02/19   Birdie Sons, MD  traMADol (ULTRAM) 50 MG tablet Take 1 tablet (50 mg total) by mouth every 6 (six) hours as needed. 11/18/19   Birdie Sons, MD  TURMERIC PO Take 800 mg by mouth 2 (two) times daily.    [provider]  XARELTO 20 MG TABS tablet TAKE 1 TABLET BY MOUTH DAILY WITH SUPPER 04/30/20   Birdie Sons, MD  Zinc 50 MG CAPS Take by mouth daily.    [provider]    Allergies Aspirin  Family History  Problem Relation Age of Onset  . Stroke Mother   . Congestive Heart Failure  Mother   . Atrial fibrillation Mother   . Melanoma Mother   . Stroke Maternal Grandmother   . Arthritis Paternal Grandmother        RA  . Diabetes Paternal Grandfather     Social History Social History   Tobacco Use  . Smoking status: Former Smoker    Quit date: 04/11/1972    Years since quitting: 48.3  . Smokeless tobacco: Never Used  Vaping Use  . Vaping Use: Never used  Substance Use Topics  . Alcohol use: No    Alcohol/week: 0.0 standard drinks  . Drug use: No    Review of Systems  Constitutional: No fever/chills Eyes: No visual changes. ENT: No sore throat.  Positive for lip laceration. Cardiovascular: Positive for chest wall pain. Respiratory: Denies shortness of breath. Gastrointestinal: No abdominal pain.  No nausea, no vomiting.  No diarrhea.  No constipation. Genitourinary: Negative for dysuria. Musculoskeletal: Negative for back pain.  Positive for right wrist pain. Skin: Negative for rash. Neurological: Negative for headaches, focal weakness or numbness.  ____________________________________________   PHYSICAL EXAM:  VITAL SIGNS: ED Triage Vitals  Enc Vitals Group     BP 08/03/20 1049 (!) 168/97     Pulse Rate 08/03/20 1049 (!) 114     Resp 08/03/20 1049 18     Temp 08/03/20 1049 (!) 97.4 F (36.3 C)     Temp Source 08/03/20 1049 Axillary     SpO2 08/03/20 1049 99 %     Weight 08/03/20 1048 150 lb  (68 kg)     Height 08/03/20 1048 5\' 1"  (1.549 m)     Head Circumference --      Peak Flow --      Pain Score 08/03/20 1105 6     Pain Loc --      Pain Edu? --      Excl. in [place]? --     Constitutional: Alert and oriented. Eyes: Conjunctivae are normal. Head: No scalp hematomas or step-offs noted. Nose: No congestion/rhinnorhea. Mouth/Throat: Mucous membranes are moist.  Through and through laceration to upper lip that does not cross the vermilion border.  Cracked left upper incisor with no exposed dentin.  No malocclusion, mandibular or maxillary bony tenderness noted. Neck: Normal ROM, no midline cervical spine tenderness. Cardiovascular: Normal rate, irregularly irregular rhythm. Grossly normal heart sounds.  2+ radial pulses bilaterally. Respiratory: Normal respiratory effort.  No retractions. Lungs CTAB. Gastrointestinal: Soft and nontender. No distention. Genitourinary: deferred Musculoskeletal: No lower extremity tenderness nor edema.  Diffuse tenderness of bilateral hips, range of motion to bilateral lower extremities is intact.  Diffuse tenderness to right wrist with no obvious deformity. Neurologic:  Normal speech and language. No gross focal neurologic deficits are appreciated. Skin:  Skin is warm, dry and intact. No rash noted. Psychiatric: Mood and affect are normal. Speech and behavior are normal.  ____________________________________________   LABS (all labs ordered are listed, but only abnormal results are displayed)  Labs Reviewed  BASIC METABOLIC PANEL - Abnormal; Notable for the following components:      Result Value   Glucose, Bld 196 (*)    All other components within normal limits  PROTIME-INR - Abnormal; Notable for the following components:   Prothrombin Time 25.9 (*)    INR 2.5 (*)    All other components within normal limits  CBC  TROPONIN I (HIGH SENSITIVITY)  TROPONIN I (HIGH SENSITIVITY)   ____________________________________________  EKG  ED  ECG REPORT I, Blake Divine, the  attending physician, personally viewed and interpreted this ECG.   Date: 08/03/2020  EKG Time: 10:54  Rate: 103  Rhythm: atrial fibrillation, rate 103  Axis: LAD  Intervals:none  ST&T Change: None   PROCEDURES  Procedure(s) performed (including Critical Care):  Marland KitchenMarland KitchenLaceration Repair  Date/Time: 08/03/2020 4:23 PM Performed by: Blake Divine, MD Authorized by: Blake Divine, MD   Consent:    Consent obtained:  Verbal   Consent given by:  Patient Anesthesia (see MAR for exact dosages):    Anesthesia method:  Local infiltration   Local anesthetic:  Lidocaine 1% WITH epi Laceration details:    Location:  Lip   Lip location:  Upper exterior lip   Length (cm):  4 Repair type:    Repair type:  Simple Pre-procedure details:    Preparation:  Patient was prepped and draped in usual sterile fashion Exploration:    Contaminated: no   Treatment:    Area cleansed with:  Saline   Amount of cleaning:  Standard   Irrigation solution:  Sterile saline   Irrigation method:  Pressure wash   Visualized foreign bodies/material removed: no   Skin repair:    Repair method:  Sutures   Suture size:  4-0   Suture material:  Nylon   Suture technique:  Simple interrupted   Number of sutures:  4 Approximation:    Approximation:  Close   Vermilion border: well-aligned   Post-procedure details:    Dressing:  Antibiotic ointment   Patient tolerance of procedure:  Tolerated well, no immediate complications .Marland KitchenLaceration Repair  Date/Time: 08/03/2020 4:24 PM Performed by: Blake Divine, MD Authorized by: Blake Divine, MD   Consent:    Consent obtained:  Verbal   Consent given by:  Patient Anesthesia (see MAR for exact dosages):    Anesthesia method:  Local infiltration   Local anesthetic:  Lidocaine 1% WITH epi Laceration details:    Location:  Lip   Lip location:  Upper interior lip   Length (cm):  2 Repair type:    Repair type:   Simple Pre-procedure details:    Preparation:  Patient was prepped and draped in usual sterile fashion and imaging obtained to evaluate for foreign bodies Exploration:    Contaminated: no   Treatment:    Area cleansed with:  Saline   Amount of cleaning:  Standard   Irrigation solution:  Sterile saline   Irrigation method:  Pressure wash   Visualized foreign bodies/material removed: no   Skin repair:    Repair method:  Sutures   Suture size:  4-0   Suture material:  Fast-absorbing gut   Suture technique:  Simple interrupted   Number of sutures:  2 Approximation:    Approximation:  Loose   Vermilion border: well-aligned   Post-procedure details:    Dressing:  Open (no dressing)   Patient tolerance of procedure:  Tolerated well, no immediate complications     ____________________________________________   INITIAL IMPRESSION / ASSESSMENT AND PLAN / ED COURSE       79 year old female with past medical history of hypertension, stroke, DVT, and chronic atrial fibrillation on Xarelto who presents to the ED following fall of unclear etiology.  Patient does not think she passed out and thinks she may have tripped on her sneaker.  EKG shows her chronic atrial fibrillation with no ischemic changes, 2 sets of troponin are negative and I doubt cardiac source of her fall.  Remainder of lab work is also reassuring.  CT head, C-spine, and  maxillofacial are negative for acute process.  Patient does have through and through laceration to her upper lip that will require repair in addition to chip of her left upper incisor, which she may follow-up with dentistry for.  We will further assess her right wrist and hips with x-ray, although she states hip pain is chronic and unchanged following the fall.  She also has some chest wall tenderness which we will further assess with chest x-ray.  X-rays of right wrist show possible scaphoid waist fracture and patient was placed in thumb spica splint, provided  referral to orthopedis for follow-up.  X-rays of left hip and chest are negative for acute traumatic injury.  Lacerations to exterior and interior lip were repaired.  Patient is appropriate for discharge home with orthopedic and PCP follow-up.  Patient and daughter agree with plan.      ____________________________________________   FINAL CLINICAL IMPRESSION(S) / ED DIAGNOSES  Final diagnoses:  Fall, initial encounter  Lip laceration, initial encounter  Closed nondisplaced fracture of scaphoid of right wrist, unspecified portion of scaphoid, initial encounter     ED Discharge Orders    None       Note:  This document was prepared using Dragon voice recognition software and may include unintentional dictation errors.   Blake Divine, MD 08/03/20 [phone]

## 2020-08-03 NOTE — ED Notes (Signed)
This tech applied clean dressing on patient upper lip laceration.

## 2020-08-04 NOTE — Progress Notes (Signed)
Subjective:   [name] [name] is a 79 y.o. female who presents for Medicare Annual (Subsequent) preventive examination.  I connected with Investment banker, corporate today by telephone and verified that I am speaking with the correct person using two identifiers. Location patient: home Location provider: work Persons participating in the virtual visit: patient, provider.   I discussed the limitations, risks, security and privacy concerns of performing an evaluation and management service by telephone and the availability of in person appointments. I also discussed with the patient that there may be a patient responsible charge related to this service. The patient expressed understanding and verbally consented to this telephonic visit.    Interactive audio and video telecommunications were attempted between this provider and patient, however failed, due to patient having technical difficulties OR patient did not have access to video capability.  We continued and completed visit with audio only.   Review of Systems    N/A  Cardiac Risk Factors include: advanced age (>54men, >53 women);hypertension     Objective:    There were no vitals filed for this visit. There is no height or weight on file to calculate BMI.  Advanced Directives 08/09/2020 08/03/2020 12/03/2019 07/22/2019 03/12/2019 03/11/2019 09/24/2018  Does Patient Have a Medical Advance Directive? No No No Yes No No No  Type of Advance Directive - - Public librarian;Living will - - -  Copy of [place] in Chart? - - - No - copy requested - - -  Would patient like information on creating a medical advance directive? No - Patient declined - - - No - Patient declined No - Patient declined Yes (MAU/Ambulatory/Procedural Areas - Information given)    Current Medications (verified) Outpatient Encounter Medications as of 08/09/2020  Medication Sig  . acetaminophen (TYLENOL) 650 MG CR tablet Take 650 mg by  mouth every 8 (eight) hours as needed for pain.  Marland Kitchen alendronate (FOSAMAX) 70 MG tablet TAKE 1 TABLET BY MOUTH WEEKLY IN THE MORNING WITH A FULL GLASS OF WATER. DO NOT EAT, DRINK OR LIE DOWN FOR 30 MIN.  Marland Kitchen amoxicillin (AMOXIL) 500 MG capsule Take 4 tablets one hour before procedure  . Artificial Tear Solution (SOOTHE XP XTRA PROTECTION) SOLN Apply 1 drop to eye as needed (both eyes).   . B Complex-Biotin-FA (EQL B COMPLEX 100) TABS daily.   . Biotin 10 MG CAPS Take by mouth daily.  . bisacodyl (DULCOLAX) 5 MG EC tablet Take 5 mg by mouth daily as needed for moderate constipation.  . Calcium-Magnesium-Vitamin D (CALCIUM 1200+D3 PO) Take 1,200 mg by mouth daily. Citracal ER  . cholecalciferol (VITAMIN D) 1000 units tablet Take 1,000 Units by mouth daily.  . Coenzyme Q10 (CO Q-10) 100 MG CAPS Take 100 mg by mouth daily.   Marland Kitchen diltiazem (CARDIZEM CD) 240 MG 24 hr capsule Take 1 capsule (240 mg total) by mouth daily.  Marland Kitchen gabapentin (NEURONTIN) 300 MG capsule TAKE ONE CAPSULE BY MOUTH TWICE A DAY  . HYDROcodone-acetaminophen (NORCO/VICODIN) 5-325 MG tablet Take 1 tablet by mouth 2 (two) times daily as needed.  . Lactobacillus (ACIDOPHILUS PO) Take 1 tablet by mouth at bedtime.   . lidocaine (LIDODERM) 5 % Place 1 patch onto the skin daily. Remove & Discard patch within 12 hours  . MEGARED OMEGA-3 KRILL OIL PO Take 300 mg by mouth daily.   . Menthol, Topical Analgesic, (ICY HOT BACK EX) Apply topically as needed.  . metoprolol succinate (TOPROL-XL) 25 MG 24 hr  tablet TAKE ONE-HALF TABLET BY MOUTH DAILY  . oxybutynin (DITROPAN) 5 MG tablet TAKE 1 TABLET BY MOUTH TWICE A DAY  . TURMERIC PO Take 800 mg by mouth 2 (two) times daily.  Alveda Reasons 20 MG TABS tablet TAKE 1 TABLET BY MOUTH DAILY WITH SUPPER  . Zinc 50 MG CAPS Take 25 mg by mouth daily.   . traMADol (ULTRAM) 50 MG tablet Take 1 tablet (50 mg total) by mouth every 6 (six) hours as needed. (Patient not taking: Reported on 08/09/2020)   No  facility-administered encounter medications on file as of 08/09/2020.    Allergies (verified) Aspirin   History: Past Medical History:  Diagnosis Date  . Arthritis    osteo; in B knees;   . Bladder disorder   . Chicken pox   . Chronic a-fib ([place])   . DVT (deep venous thrombosis) ([place])    H/O  . Dyspnea   . Dysrhythmia    a fib  . Edema   . Hay fever   . Heart murmur   . Hypertension    controlled  . Irregular heart beat   . Scoliosis   . Seizure ([place])    2 years ago knocked unconscious; hospitalized and diagnosed with seizures; no spells in last year;   . Sepsis ([place]) 03/11/2019  . Stroke [facility])    1 month ago (possible stroke)  . Unspecified atrial fibrillation [facility])    Past Surgical History:  Procedure Laterality Date  . bilateral wrist surgery  2003  . BREAST BIOPSY Left    stereo  . BREAST SURGERY    . CARDIAC ELECTROPHYSIOLOGY [place] AND ABLATION  2007   Done in [place], [place] for atrial flutter  . CATARACT EXTRACTION W/PHACO Right 06/01/2015   Procedure: CATARACT EXTRACTION PHACO AND INTRAOCULAR LENS PLACEMENT (IOC);  Surgeon: Birder Robson, MD;  Location: [facility] ORS;  Service: Ophthalmology;  Laterality: Right;  [place]: 01:02.0 AP%: 23.1 CDE: 14.33 Fluid lot# [id number]   . CATARACT EXTRACTION W/PHACO Left 06/22/2015   Procedure: CATARACT EXTRACTION PHACO AND INTRAOCULAR LENS PLACEMENT (IOC);  Surgeon: Birder Robson, MD;  Location: [facility] ORS;  Service: Ophthalmology;  Laterality: Left;  [place]: 01:00.7 AP%: 23.1 CDE: 14.01 Lot # [id number]  . COLONOSCOPY    . DILATION AND CURETTAGE OF UTERUS    . JOINT REPLACEMENT    . KNEE ARTHROPLASTY Right 03/01/2016   Procedure: COMPUTER ASSISTED TOTAL KNEE ARTHROPLASTY;  Surgeon: Dereck Leep, MD;  Location: [facility] ORS;  Service: Orthopedics;  Laterality: Right;  . KNEE ARTHROPLASTY Left 04/23/2017   Procedure: COMPUTER ASSISTED TOTAL KNEE ARTHROPLASTY;  Surgeon: Dereck Leep, MD;  Location: [facility] ORS;  Service: Orthopedics;   Laterality: Left;  . LAMINECTOMY    . TONSILLECTOMY    . TOTAL KNEE ARTHROPLASTY Right    Family History  Problem Relation Age of Onset  . Stroke Mother   . Congestive Heart Failure Mother   . Atrial fibrillation Mother   . Melanoma Mother   . Stroke Maternal Grandmother   . Arthritis Paternal Grandmother        RA  . Diabetes Paternal Grandfather    Social History   Socioeconomic History  . Marital status: Divorced    Spouse name: Not on file  . Number of children: 2  . Years of education: Not on file  . Highest education level: Bachelor's degree (e.g., BA, AB, BS)  Occupational History  . Occupation: Retired  Tobacco Use  . Smoking status: Former Smoker  Quit date: 04/11/1972    Years since quitting: 48.3  . Smokeless tobacco: Never Used  Vaping Use  . Vaping Use: Never used  Substance and Sexual Activity  . Alcohol use: No    Alcohol/week: 0.0 standard drinks  . Drug use: No  . Sexual activity: Not on file  Other Topics Concern  . Not on file  Social History Narrative   Retired, divorced, gets regular exercise ([place]- 3x weekly; walks dog)   Social Determinants of Health   Financial Resource Strain: Low Risk   . Difficulty of Paying Living Expenses: Not hard at all  Food Insecurity: No Food Insecurity  . Worried About Charity fundraiser in the Last Year: Never true  . Ran Out of Food in the Last Year: Never true  Transportation Needs: No Transportation Needs  . Lack of Transportation (Medical): No  . Lack of Transportation (Non-Medical): No  Physical Activity: Inactive  . Days of Exercise per Week: 0 days  . Minutes of Exercise per Session: 0 min  Stress: No Stress Concern Present  . Feeling of Stress : Not at all  Social Connections: Moderately Isolated  . Frequency of Communication with Friends and Family: More than three times a week  . Frequency of Social Gatherings with Friends and Family: Three times a week  . Attends Religious Services: More  than 4 times per year  . Active Member of Clubs or Organizations: No  . Attends Archivist Meetings: Never  . Marital Status: Divorced    Tobacco Counseling Counseling given: Not Answered   Clinical Intake:  Pre-visit preparation completed: Yes  Pain : No/denies pain (Only has pain with movement.)     Nutritional Risks: None Diabetes: No  How often do you need to have someone help you when you read instructions, pamphlets, or other written materials from your doctor or pharmacy?: 1 - Never  Diabetic? No  Interpreter Needed?: No  Information entered by :: [facility], LPN   Activities of Daily Living In your present state of health, do you have any difficulty performing the following activities: 08/09/2020  Hearing? N  Vision? N  Difficulty concentrating or making decisions? N  Walking or climbing stairs? Y  Comment Due to back and hip pain.  Dressing or bathing? N  Doing errands, shopping? Y  Preparing Food and eating ? N  Using the Toilet? N  In the past six months, have you accidently leaked urine? N  Comment Currently on Oxybutynin.  Do you have problems with loss of bowel control? N  Managing your Medications? N  Managing your Finances? N  Housekeeping or managing your Housekeeping? N  Some recent data might be hidden    Patient Care Team: Birdie Sons, MD as PCP - General (Family Medicine) Vladimir Crofts, MD (Neurology) Minna Merritts, MD as Consulting Physician (Cardiology) Sharlet Salina, MD as Referring Physician (Physical Medicine and Rehabilitation) Juventino Slovak, Alveda Reasons, MD as Consulting Physician (Ophthalmology)  Indicate any recent Medical Services you may have received from other than [facility] providers in the past year (date may be approximate).     Assessment:   This is a routine wellness examination for [name] [name].  Hearing/Vision screen No exam data present  Dietary issues and exercise activities  discussed: Current Exercise Habits: The patient does not participate in regular exercise at present, Exercise limited by: orthopedic condition(s)  Goals    . LIFESTYLE - DECREASE FALLS RISK     Discussed  way to prep home inside and out to help prevent future falls.       Depression Screen PHQ 2/9 Scores 08/09/2020 07/22/2019 08/27/2018 07/24/2018 07/17/2018 05/13/2018 03/04/2018  PHQ - 2 Score 1 0 0 0 0 0 0  PHQ- 9 Score - - - - - - -    Fall Risk Fall Risk  08/09/2020 07/22/2019 07/17/2018 05/13/2018 03/04/2018  Falls in the past year? 1 1 Yes No Yes  Number falls in past yr: 0 0 2 or more - 1  Injury with Fall? 0 0 No - No  Follow up Falls prevention discussed Falls prevention discussed Falls prevention discussed - Falls prevention discussed    Any stairs in or around the home? No  If so, are there any without handrails? No  Home free of loose throw rugs in walkways, pet beds, electrical cords, etc? Yes  Adequate lighting in your home to reduce risk of falls? Yes   ASSISTIVE DEVICES UTILIZED TO PREVENT FALLS:  Life alert? Yes  Use of a cane, walker or w/c? Yes  Grab bars in the bathroom? Yes  Shower chair or bench in shower? Yes  Elevated toilet seat or a handicapped toilet? Yes    Cognitive Function:     6CIT Screen 08/09/2020 07/17/2018 07/04/2017  What Year? 0 points 0 points 0 points  What month? 0 points 0 points 0 points  What time? 3 points 0 points 0 points  Count back from 20 0 points 0 points 0 points  Months in reverse 0 points 0 points 2 points  Repeat phrase 2 points 0 points 2 points  Total Score 5 0 4    Immunizations Immunization History  Administered Date(s) Administered  . Fluad Quad(high Dose 65+) 09/10/2019  . Influenza Split 07/14/2009, 08/02/2010  . Influenza, High Dose Seasonal PF 07/24/2014, 09/22/2015, 08/02/2016, 07/04/2017, 07/17/2018  . Influenza,inj,Quad PF,6+ Mos 07/04/2013  . Moderna SARS-COVID-2 Vaccination 01/21/2020, 02/18/2020  . PPD  Test 03/04/2016  . Pneumococcal Conjugate-13 07/24/2014  . Pneumococcal Polysaccharide-23 07/14/2009  . Td 06/16/2008  . Tdap 01/05/2011  . Zoster 11/29/2011    TDAP status: Up to date Flu Vaccine status: Declined, Education has been provided regarding the importance of this vaccine but patient still declined. Advised may receive this vaccine at local pharmacy or Health Dept. Aware to provide a copy of the vaccination record if obtained from local pharmacy or Health Dept. Verbalized acceptance and understanding. Pneumococcal vaccine status: Up to date Covid-19 vaccine status: Completed vaccines  Qualifies for Shingles Vaccine? Yes   Zostavax completed Yes   Shingrix Completed?: No.    Education has been provided regarding the importance of this vaccine. Patient has been advised to call insurance company to determine out of pocket expense if they have not yet received this vaccine. Advised may also receive vaccine at local pharmacy or Health Dept. Verbalized acceptance and understanding.  Screening Tests Health Maintenance  Topic Date Due  . DEXA SCAN  09/21/2019  . INFLUENZA VACCINE  05/23/2020  . TETANUS/TDAP  01/04/2021  . COVID-19 Vaccine  Completed  . PNA vac Low Risk Adult  Completed    Health Maintenance  Health Maintenance Due  Topic Date Due  . DEXA SCAN  09/21/2019  . INFLUENZA VACCINE  05/23/2020    Colorectal cancer screening: No longer required.  Mammogram status: No longer required.  Bone Density status: Completed 09/20/16. Results reflect: Bone density results: OSTEOPOROSIS. Repeat every 3 years. Scheduled 09/02/20.  Lung Cancer Screening: (Low Dose  CT Chest recommended if Age 67-80 years, 30 pack-year currently smoking OR have quit w/in 15years.) does not qualify.   Additional Screening:  Vision Screening: Recommended annual ophthalmology exams for early detection of glaucoma and other disorders of the eye. Is the patient up to date with their annual eye  exam?  Yes  Who is the provider or what is the name of the office in which the patient attends annual eye exams? Dr Edison Pace @ [facility] If pt is not established with a provider, would they like to be referred to a provider to establish care? No .   Dental Screening: Recommended annual dental exams for proper oral hygiene  Community Resource Referral / Chronic Care Management: CRR required this visit?  No   CCM required this visit?  No      Plan:     I have personally reviewed and noted the following in the patient's chart:   . Medical and social history . Use of alcohol, tobacco or illicit drugs  . Current medications and supplements . Functional ability and status . Nutritional status . Physical activity . Advanced directives . List of other physicians . Hospitalizations, surgeries, and ER visits in previous 12 months . Vitals . Screenings to include cognitive, depression, and falls . Referrals and appointments  In addition, I have reviewed and discussed with patient certain preventive protocols, quality metrics, and best practice recommendations. A written personalized care plan for preventive services as well as general preventive health recommendations were provided to patient.     Mirela Parsley [place], [place]   [id number]   Nurse Notes: Pt would like to receive her flu shot at [place] in office apt.

## 2020-08-09 ENCOUNTER — Other Ambulatory Visit: Payer: Self-pay

## 2020-08-09 ENCOUNTER — Ambulatory Visit (INDEPENDENT_AMBULATORY_CARE_PROVIDER_SITE_OTHER): Payer: PPO

## 2020-08-09 DIAGNOSIS — Z0000 Encounter for general adult medical examination without abnormal findings: Secondary | ICD-10-CM

## 2020-08-09 NOTE — Patient Instructions (Signed)
[name] [name] , Thank you for taking time to come for your Medicare Wellness Visit. I appreciate your ongoing commitment to your health goals. Please review the following plan we discussed and let me know if I can assist you in the future.   Screening recommendations/referrals: Colonoscopy: No longer required.  Mammogram: No longer required.  Bone Density: Up to date, scheduled for 08/2023 Recommended yearly ophthalmology/optometry visit for glaucoma screening and checkup Recommended yearly dental visit for hygiene and checkup  Vaccinations: Influenza vaccine: Currently due. Will receive at [facility] in office apt.  Pneumococcal vaccine: Completed series Tdap vaccine: Up to date, due 12/2020 Shingles vaccine: Shingrix discussed. Please contact your pharmacy for coverage information.     Advanced directives: Advance directive discussed with you today. Even though you declined this today please call our office should you change your mind and we can give you the proper paperwork for you to fill out.  Conditions/risks identified: Fall risk preventatives discussed today.   Next appointment: 08/10/20 @ 10:40 AM with Dr Caryn Section    Preventive Care 78 Years and Older, Female Preventive care refers to lifestyle choices and visits with your health care provider that can promote health and wellness. What does preventive care include?  A yearly physical exam. This is also called an annual well check.  Dental exams once or twice a year.  Routine eye exams. Ask your health care provider how often you should have your eyes checked.  Personal lifestyle choices, including:  Daily care of your teeth and gums.  Regular physical activity.  Eating a healthy diet.  Avoiding tobacco and drug use.  Limiting alcohol use.  Practicing safe sex.  Taking low-dose aspirin every day.  Taking vitamin and mineral supplements as recommended by your health care provider. What happens during an annual  well check? The services and screenings done by your health care provider during your annual well check will depend on your age, overall health, lifestyle risk factors, and family history of disease. Counseling  Your health care provider may ask you questions about your:  Alcohol use.  Tobacco use.  Drug use.  Emotional well-being.  Home and relationship well-being.  Sexual activity.  Eating habits.  History of falls.  Memory and ability to understand (cognition).  Work and work Statistician.  Reproductive health. Screening  You may have the following tests or measurements:  Height, weight, and BMI.  Blood pressure.  Lipid and cholesterol levels. These may be checked every 5 years, or more frequently if you are over 6 years old.  Skin check.  Lung cancer screening. You may have this screening every year starting at age 78 if you have a 30-pack-year history of smoking and currently smoke or have quit within the past 15 years.  Fecal occult blood test (FOBT) of the stool. You may have this test every year starting at age 78.  Flexible sigmoidoscopy or colonoscopy. You may have a sigmoidoscopy every 5 years or a colonoscopy every 10 years starting at age 78.  Hepatitis C blood test.  Hepatitis B blood test.  Sexually transmitted disease (STD) testing.  Diabetes screening. This is done by checking your blood sugar (glucose) after you have not eaten for a while (fasting). You may have this done every 1-3 years.  Bone density scan. This is done to screen for osteoporosis. You may have this done starting at age 78.  Mammogram. This may be done every 1-2 years. Talk to your health care provider about how often you  should have regular mammograms. Talk with your health care provider about your test results, treatment options, and if necessary, the need for more tests. Vaccines  Your health care provider may recommend certain vaccines, such as:  Influenza vaccine. This  is recommended every year.  Tetanus, diphtheria, and acellular pertussis (Tdap, Td) vaccine. You may need a Td booster every 10 years.  Zoster vaccine. You may need this after age 78.  Pneumococcal 13-valent conjugate (PCV13) vaccine. One dose is recommended after age 78.  Pneumococcal polysaccharide (PPSV23) vaccine. One dose is recommended after age 78. Talk to your health care provider about which screenings and vaccines you need and how often you need them. This information is not intended to replace advice given to you by your health care provider. Make sure you discuss any questions you have with your health care provider. Document Released: 11/05/2015 Document Revised: 06/28/2016 Document Reviewed: 08/10/2015 Elsevier Interactive Patient Education  2017 [employer] Prevention in the Home Falls can cause injuries. They can happen to people of all ages. There are many things you can do to make your home safe and to help prevent falls. What can I do on the outside of my home?  Regularly fix the edges of walkways and driveways and fix any cracks.  Remove anything that might make you trip as you walk through a door, such as a raised step or threshold.  Trim any bushes or trees on the path to your home.  Use bright outdoor lighting.  Clear any walking paths of anything that might make someone trip, such as rocks or tools.  Regularly check to see if handrails are loose or broken. Make sure that both sides of any steps have handrails.  Any raised decks and porches should have guardrails on the edges.  Have any leaves, snow, or ice cleared regularly.  Use sand or salt on walking paths during winter.  Clean up any spills in your garage right away. This includes oil or grease spills. What can I do in the bathroom?  Use night lights.  Install grab bars by the toilet and in the tub and shower. Do not use towel bars as grab bars.  Use non-skid mats or decals in the tub or  shower.  If you need to sit down in the shower, use a plastic, non-slip stool.  Keep the floor dry. Clean up any water that spills on the floor as soon as it happens.  Remove soap buildup in the tub or shower regularly.  Attach bath mats securely with double-sided non-slip rug tape.  Do not have throw rugs and other things on the floor that can make you trip. What can I do in the bedroom?  Use night lights.  Make sure that you have a light by your bed that is easy to reach.  Do not use any sheets or blankets that are too big for your bed. They should not hang down onto the floor.  Have a firm chair that has side arms. You can use this for support while you get dressed.  Do not have throw rugs and other things on the floor that can make you trip. What can I do in the kitchen?  Clean up any spills right away.  Avoid walking on wet floors.  Keep items that you use a lot in easy-to-reach places.  If you need to reach something above you, use a strong step stool that has a grab bar.  Keep electrical cords out  of the way.  Do not use floor polish or wax that makes floors slippery. If you must use wax, use non-skid floor wax.  Do not have throw rugs and other things on the floor that can make you trip. What can I do with my stairs?  Do not leave any items on the stairs.  Make sure that there are handrails on both sides of the stairs and use them. Fix handrails that are broken or loose. Make sure that handrails are as long as the stairways.  Check any carpeting to make sure that it is firmly attached to the stairs. Fix any carpet that is loose or worn.  Avoid having throw rugs at the top or bottom of the stairs. If you do have throw rugs, attach them to the floor with carpet tape.  Make sure that you have a light switch at the top of the stairs and the bottom of the stairs. If you do not have them, ask someone to add them for you. What else can I do to help prevent  falls?  Wear shoes that:  Do not have high heels.  Have rubber bottoms.  Are comfortable and fit you well.  Are closed at the toe. Do not wear sandals.  If you use a stepladder:  Make sure that it is fully opened. Do not climb a closed stepladder.  Make sure that both sides of the stepladder are locked into place.  Ask someone to hold it for you, if possible.  Clearly mark and make sure that you can see:  Any grab bars or handrails.  First and last steps.  Where the edge of each step is.  Use tools that help you move around (mobility aids) if they are needed. These include:  Canes.  Walkers.  Scooters.  Crutches.  Turn on the lights when you go into a dark area. Replace any light bulbs as soon as they burn out.  Set up your furniture so you have a clear path. Avoid moving your furniture around.  If any of your floors are uneven, fix them.  If there are any pets around you, be aware of where they are.  Review your medicines with your doctor. Some medicines can make you feel dizzy. This can increase your chance of falling. Ask your doctor what other things that you can do to help prevent falls. This information is not intended to replace advice given to you by your health care provider. Make sure you discuss any questions you have with your health care provider. Document Released: 08/05/2009 Document Revised: 03/16/2016 Document Reviewed: 11/13/2014 Elsevier Interactive Patient Education  2017 [employer].

## 2020-08-09 NOTE — Progress Notes (Signed)
Established patient visit   Patient: [name] [name]   DOB: 1940/11/20   79 y.o. Female  MRN: [id number] Visit Date: 08/10/2020  Today's healthcare provider: Lelon Huh, MD   Chief Complaint  Patient presents with  . Follow-up   Subjective    HPI  Follow up ER visit  Patient was seen in ER for a fall on 08/03/2020.  She was treated for fall, lip laceration and fracture of right wrist. Treatment for this included CT, X-ray,labs Sutures, and started Oxycodone .She reports good compliance with treatment. She reports this condition is Improved. However she continues to have persistent pain right mid and lateral ribs making it hard to catch her breath.  -----------------------------------------------------------------------------------------    Medications: Outpatient Medications Prior to Visit  Medication Sig  . acetaminophen (TYLENOL) 650 MG CR tablet Take 650 mg by mouth every 8 (eight) hours as needed for pain.  Marland Kitchen alendronate (FOSAMAX) 70 MG tablet TAKE 1 TABLET BY MOUTH WEEKLY IN THE MORNING WITH A FULL GLASS OF WATER. DO NOT EAT, DRINK OR LIE DOWN FOR 30 MIN.  Marland Kitchen amoxicillin (AMOXIL) 500 MG capsule Take 4 tablets one hour before procedure  . Artificial Tear Solution (SOOTHE XP XTRA PROTECTION) SOLN Apply 1 drop to eye as needed (both eyes).   . B Complex-Biotin-FA (EQL B COMPLEX 100) TABS daily.   . Biotin 10 MG CAPS Take by mouth daily.  . bisacodyl (DULCOLAX) 5 MG EC tablet Take 5 mg by mouth daily as needed for moderate constipation.  . Calcium-Magnesium-Vitamin D (CALCIUM 1200+D3 PO) Take 1,200 mg by mouth daily. Citracal ER  . cholecalciferol (VITAMIN D) 1000 units tablet Take 1,000 Units by mouth daily.  . Coenzyme Q10 (CO Q-10) 100 MG CAPS Take 100 mg by mouth daily.   Marland Kitchen diltiazem (CARDIZEM CD) 240 MG 24 hr capsule Take 1 capsule (240 mg total) by mouth daily.  Marland Kitchen gabapentin (NEURONTIN) 300 MG capsule TAKE ONE CAPSULE BY MOUTH TWICE A DAY  .  HYDROcodone-acetaminophen (NORCO/VICODIN) 5-325 MG tablet Take 1 tablet by mouth 2 (two) times daily as needed.  . Lactobacillus (ACIDOPHILUS PO) Take 1 tablet by mouth at bedtime.   . lidocaine (LIDODERM) 5 % Place 1 patch onto the skin daily. Remove & Discard patch within 12 hours  . MEGARED OMEGA-3 KRILL OIL PO Take 300 mg by mouth daily.   . Menthol, Topical Analgesic, (ICY HOT BACK EX) Apply topically as needed.  . metoprolol succinate (TOPROL-XL) 25 MG 24 hr tablet TAKE ONE-HALF TABLET BY MOUTH DAILY  . oxybutynin (DITROPAN) 5 MG tablet TAKE 1 TABLET BY MOUTH TWICE A DAY  . traMADol (ULTRAM) 50 MG tablet Take 1 tablet (50 mg total) by mouth every 6 (six) hours as needed.  . TURMERIC PO Take 800 mg by mouth 2 (two) times daily.  Alveda Reasons 20 MG TABS tablet TAKE 1 TABLET BY MOUTH DAILY WITH SUPPER  . Zinc 50 MG CAPS Take 25 mg by mouth daily.    No facility-administered medications prior to visit.    Review of Systems  Constitutional: Negative for appetite change, chills, fatigue and fever.  Respiratory: Negative for chest tightness and shortness of breath.        Pain during breating  Cardiovascular: Negative for chest pain and palpitations.  Gastrointestinal: Negative for abdominal pain, nausea and vomiting.  Musculoskeletal: Positive for myalgias (right side pain).  Neurological: Negative for dizziness and weakness.     Objective    BP 119/80 (  BP Location: Right Arm, Patient Position: Sitting, Cuff Size: Large)   Pulse 86   Temp 98 F (36.7 C) (Oral)   Resp 16   Wt 163 lb (73.9 kg)   BMI 30.80 kg/m   Physical Exam    General: Appearance:    Well developed, well nourished female in no acute distress  Eyes:    PERRL, conjunctiva/corneas clear, EOM's intact       Lungs:     Clear to auscultation bilaterally, respirations unlabored  Heart:    Normal heart rate. Irregularly irregular rhythm. No murmurs, rubs, or gallops.   MS:   All extremities are intact.   Neurologic:    Awake, alert, oriented x 3. No apparent focal neurological           defect.   Skin:   Scabbed laceration upper lip is clean and dry, sutures in place.        Assessment & Plan     1. Lip laceration, initial encounter Sutures in place for 7 days and healing appropriated. Completely removed all four sutures without complication.   2. Shortness of breath  - DG Chest 2 View; Future  3. Rib pain on right side  - DG Ribs Unilateral Right; Future  4. Need for influenza vaccination  - Flu Vaccine QUAD High Dose IM (Fluad)         The entirety of the information documented in the History of Present Illness, Review of Systems and Physical Exam were personally obtained by me. Portions of this information were initially documented by the CMA and reviewed by me for thoroughness and accuracy.      Lelon Huh, MD  [facility] [phone] (phone) [phone] (fax)  [place]

## 2020-08-10 ENCOUNTER — Ambulatory Visit (INDEPENDENT_AMBULATORY_CARE_PROVIDER_SITE_OTHER): Payer: PPO | Admitting: Family Medicine

## 2020-08-10 ENCOUNTER — Encounter: Payer: Self-pay | Admitting: Family Medicine

## 2020-08-10 ENCOUNTER — Other Ambulatory Visit: Payer: Self-pay

## 2020-08-10 VITALS — BP 119/80 | HR 86 | Temp 98.0°F | Resp 16 | Wt 163.0 lb

## 2020-08-10 DIAGNOSIS — R0781 Pleurodynia: Secondary | ICD-10-CM | POA: Diagnosis not present

## 2020-08-10 DIAGNOSIS — Z23 Encounter for immunization: Secondary | ICD-10-CM | POA: Diagnosis not present

## 2020-08-10 DIAGNOSIS — R0602 Shortness of breath: Secondary | ICD-10-CM | POA: Diagnosis not present

## 2020-08-10 DIAGNOSIS — S01511A Laceration without foreign body of lip, initial encounter: Secondary | ICD-10-CM

## 2020-08-10 NOTE — Patient Instructions (Signed)
.   Please review the attached list of medications and notify my office if there are any errors.   Go to the [facility] on [facility] for rib and chest Xray

## 2020-08-11 ENCOUNTER — Other Ambulatory Visit: Payer: Self-pay

## 2020-08-11 ENCOUNTER — Ambulatory Visit
Admission: RE | Admit: 2020-08-11 | Discharge: 2020-08-11 | Disposition: A | Discharge status: Home / Self Care | Payer: PPO | Attending: Family Medicine | Admitting: Family Medicine

## 2020-08-11 ENCOUNTER — Ambulatory Visit
Admission: RE | Admit: 2020-08-11 | Discharge: 2020-08-11 | Disposition: A | Discharge status: Home / Self Care | Payer: PPO | Source: Ambulatory Visit | Attending: Family Medicine | Admitting: Family Medicine

## 2020-08-11 DIAGNOSIS — S2241XA Multiple fractures of ribs, right side, initial encounter for closed fracture: Secondary | ICD-10-CM | POA: Diagnosis not present

## 2020-08-11 DIAGNOSIS — R0781 Pleurodynia: Secondary | ICD-10-CM

## 2020-08-11 DIAGNOSIS — M419 Scoliosis, unspecified: Secondary | ICD-10-CM | POA: Diagnosis not present

## 2020-08-11 DIAGNOSIS — R0602 Shortness of breath: Secondary | ICD-10-CM | POA: Diagnosis not present

## 2020-08-16 DIAGNOSIS — M19031 Primary osteoarthritis, right wrist: Secondary | ICD-10-CM | POA: Diagnosis not present

## 2020-08-16 DIAGNOSIS — M25531 Pain in right wrist: Secondary | ICD-10-CM | POA: Diagnosis not present

## 2020-09-02 ENCOUNTER — Ambulatory Visit
Admission: RE | Admit: 2020-09-02 | Discharge: 2020-09-02 | Disposition: A | Discharge status: Home / Self Care | Payer: PPO | Source: Ambulatory Visit | Attending: Family Medicine | Admitting: Family Medicine

## 2020-09-02 ENCOUNTER — Other Ambulatory Visit: Payer: Self-pay

## 2020-09-02 ENCOUNTER — Ambulatory Visit: Payer: Self-pay

## 2020-09-02 DIAGNOSIS — G629 Polyneuropathy, unspecified: Secondary | ICD-10-CM | POA: Diagnosis not present

## 2020-09-02 DIAGNOSIS — M48062 Spinal stenosis, lumbar region with neurogenic claudication: Secondary | ICD-10-CM | POA: Diagnosis not present

## 2020-09-02 DIAGNOSIS — M5136 Other intervertebral disc degeneration, lumbar region: Secondary | ICD-10-CM | POA: Diagnosis not present

## 2020-09-02 DIAGNOSIS — Z780 Asymptomatic menopausal state: Secondary | ICD-10-CM | POA: Diagnosis not present

## 2020-09-02 DIAGNOSIS — M810 Age-related osteoporosis without current pathological fracture: Secondary | ICD-10-CM | POA: Diagnosis not present

## 2020-09-02 DIAGNOSIS — M47816 Spondylosis without myelopathy or radiculopathy, lumbar region: Secondary | ICD-10-CM | POA: Diagnosis not present

## 2020-09-02 DIAGNOSIS — Z8739 Personal history of other diseases of the musculoskeletal system and connective tissue: Secondary | ICD-10-CM | POA: Diagnosis not present

## 2020-09-02 DIAGNOSIS — M5416 Radiculopathy, lumbar region: Secondary | ICD-10-CM | POA: Diagnosis not present

## 2020-09-02 DIAGNOSIS — Z1231 Encounter for screening mammogram for malignant neoplasm of breast: Secondary | ICD-10-CM | POA: Diagnosis not present

## 2020-09-02 DIAGNOSIS — Z8262 Family history of osteoporosis: Secondary | ICD-10-CM | POA: Diagnosis not present

## 2020-09-02 NOTE — Telephone Encounter (Signed)
Called and advised patient via detailed vm per DPR that Dr.Fisher does not have any available appointments until 09/13/20. Advised patient she can call back to reschedule a sooner appointment with someone in the office.

## 2020-09-02 NOTE — Telephone Encounter (Signed)
Pt. Reports she has had left leg swelling for "about 2 months." States she notices a line from her socks to left lower leg and is concerned. No redness. Hurts when ambulating.No chest pain or shortness of breath. Has an appointment 09/13/20 and would like to be seen sooner. Prefers to see Dr. Caryn Section only.Please advise pt. Reason for Disposition . [1] MODERATE pain (e.g., interferes with normal activities, limping) AND [2] present > 3 days  Answer Assessment - Initial Assessment Questions 1. ONSET: "When did the pain start?"      Started 2 months ago 2. LOCATION: "Where is the pain located?"      Left leg 3. PAIN: "How bad is the pain?"    (Scale 1-10; or mild, moderate, severe)   -  MILD (1-3): doesn't interfere with normal activities    -  MODERATE (4-7): interferes with normal activities (e.g., work or school) or awakens from sleep, limping    -  SEVERE (8-10): excruciating pain, unable to do any normal activities, unable to walk     Walking - 10 4. WORK OR EXERCISE: "Has there been any recent work or exercise that involved this part of the body?"      No 5. CAUSE: "What do you think is causing the leg pain?"     Unsure 6. OTHER SYMPTOMS: "Do you have any other symptoms?" (e.g., chest pain, back pain, breathing difficulty, swelling, rash, fever, numbness, weakness)     Swelling - notices sock indentation.  7. PREGNANCY: "Is there any chance you are pregnant?" "When was your last menstrual period?"     No  Protocols used: LEG PAIN-A-AH

## 2020-09-02 NOTE — Telephone Encounter (Signed)
Patient states it may be the XARELTO 20 MG TABS tablet Causing her left leg to swell. Patient scheduled to see PCP on 09/13/2020 seeking clinical advice prior to appointment

## 2020-09-13 ENCOUNTER — Ambulatory Visit: Payer: Self-pay | Admitting: Family Medicine

## 2020-09-13 ENCOUNTER — Encounter: Payer: Self-pay | Admitting: Family Medicine

## 2020-09-13 ENCOUNTER — Ambulatory Visit (INDEPENDENT_AMBULATORY_CARE_PROVIDER_SITE_OTHER): Payer: PPO | Admitting: Family Medicine

## 2020-09-13 ENCOUNTER — Ambulatory Visit
Admission: RE | Admit: 2020-09-13 | Discharge: 2020-09-13 | Disposition: A | Discharge status: Home / Self Care | Payer: PPO | Attending: Family Medicine | Admitting: Family Medicine

## 2020-09-13 ENCOUNTER — Ambulatory Visit
Admission: RE | Admit: 2020-09-13 | Discharge: 2020-09-13 | Disposition: A | Discharge status: Home / Self Care | Payer: PPO | Source: Ambulatory Visit | Attending: Family Medicine | Admitting: Family Medicine

## 2020-09-13 ENCOUNTER — Other Ambulatory Visit: Payer: Self-pay

## 2020-09-13 VITALS — BP 128/77 | HR 105 | Temp 98.0°F | Resp 18 | Wt 167.8 lb

## 2020-09-13 DIAGNOSIS — M25562 Pain in left knee: Secondary | ICD-10-CM | POA: Diagnosis not present

## 2020-09-13 DIAGNOSIS — M25561 Pain in right knee: Secondary | ICD-10-CM

## 2020-09-13 DIAGNOSIS — M7989 Other specified soft tissue disorders: Secondary | ICD-10-CM

## 2020-09-13 DIAGNOSIS — M25462 Effusion, left knee: Secondary | ICD-10-CM | POA: Diagnosis not present

## 2020-09-13 NOTE — Progress Notes (Signed)
Established patient visit   Patient: [name] [name]   DOB: 12/07/1940   79 y.o. Female  MRN: [id number] Visit Date: 09/13/2020  Today's healthcare provider: Lelon Huh, MD   Chief Complaint  Patient presents with  . Leg Swelling   Subjective    HPI  Swelling of lower extremities: Patient complains of swelling of the left leg. Swelling started day 1 month ago after falling She has had some pain and bruising of the left leg also.  Patient reports having another fall 3 days ago on the side walk. Since that fall, she has been having pain on her left side      Medications: Outpatient Medications Prior to Visit  Medication Sig  . acetaminophen (TYLENOL) 650 MG CR tablet Take 650 mg by mouth every 8 (eight) hours as needed for pain.  Marland Kitchen alendronate (FOSAMAX) 70 MG tablet TAKE 1 TABLET BY MOUTH WEEKLY IN THE MORNING WITH A FULL GLASS OF WATER. DO NOT EAT, DRINK OR LIE DOWN FOR 30 MIN.  Marland Kitchen Artificial Tear Solution (SOOTHE XP XTRA PROTECTION) SOLN Apply 1 drop to eye as needed (both eyes).   . B Complex-Biotin-FA (EQL B COMPLEX 100) TABS daily.   . Biotin 10 MG CAPS Take by mouth daily.  . bisacodyl (DULCOLAX) 5 MG EC tablet Take 5 mg by mouth daily as needed for moderate constipation.  . Calcium-Magnesium-Vitamin D (CALCIUM 1200+D3 PO) Take 1,200 mg by mouth daily. Citracal ER  . cholecalciferol (VITAMIN D) 1000 units tablet Take 1,000 Units by mouth daily.  . Coenzyme Q10 (CO Q-10) 100 MG CAPS Take 100 mg by mouth daily.   Marland Kitchen diltiazem (CARDIZEM CD) 240 MG 24 hr capsule Take 1 capsule (240 mg total) by mouth daily.  Marland Kitchen gabapentin (NEURONTIN) 300 MG capsule TAKE ONE CAPSULE BY MOUTH TWICE A DAY  . HYDROcodone-acetaminophen (NORCO/VICODIN) 5-325 MG tablet Take 1 tablet by mouth 2 (two) times daily as needed.  . Lactobacillus (ACIDOPHILUS PO) Take 1 tablet by mouth at bedtime.   . lidocaine (LIDODERM) 5 % Place 1 patch onto the skin daily. Remove & Discard patch within 12 hours  .  MEGARED OMEGA-3 KRILL OIL PO Take 300 mg by mouth daily.   . Menthol, Topical Analgesic, (ICY HOT BACK EX) Apply topically as needed.  . metoprolol succinate (TOPROL-XL) 25 MG 24 hr tablet TAKE ONE-HALF TABLET BY MOUTH DAILY  . oxybutynin (DITROPAN) 5 MG tablet TAKE 1 TABLET BY MOUTH TWICE A DAY  . traMADol (ULTRAM) 50 MG tablet Take 1 tablet (50 mg total) by mouth every 6 (six) hours as needed.  . TURMERIC PO Take 800 mg by mouth 2 (two) times daily.  Alveda Reasons 20 MG TABS tablet TAKE 1 TABLET BY MOUTH DAILY WITH SUPPER  . Zinc 50 MG CAPS Take 25 mg by mouth daily.   Marland Kitchen amoxicillin (AMOXIL) 500 MG capsule Take 4 tablets one hour before procedure (Patient not taking: Reported on 09/13/2020)   No facility-administered medications prior to visit.    Review of Systems  Constitutional: Negative for appetite change, chills, fatigue and fever.  Respiratory: Negative for chest tightness and shortness of breath.   Cardiovascular: Positive for leg swelling (left leg). Negative for chest pain and palpitations.  Gastrointestinal: Negative for abdominal pain, nausea and vomiting.  Musculoskeletal: Positive for gait problem and myalgias (left side and leg).  Neurological: Negative for dizziness and weakness.  Hematological: Bruises/bleeds easily.      Objective    BP  128/77 (BP Location: Left Arm, Patient Position: Sitting, Cuff Size: Normal)   Pulse (!) 105   Temp 98 F (36.7 C)   Resp 18   Wt 167 lb 12.8 oz (76.1 kg)   BMI 31.71 kg/m    Physical Exam   Moderated swollen legs and thighs bilaterally with extensive varicosities. Tender around joint lines of both knees. No calf or thigh tenderness. Surgical scars of prior knee arthroscopy noted.      Assessment & Plan     1. Acute pain of both knees  - DG Knee Complete 4 Views Left; Future - DG Knee Complete 4 Views Right; Future   2. Bilateral leg swelling secondary to vascular insufficiency.  No sign of DVT as patient was  concerned. reinforced leg elevation.   No follow-ups on file.      The entirety of the information documented in the History of Present Illness, Review of Systems and Physical Exam were personally obtained by me. Portions of this information were initially documented by the CMA and reviewed by me for thoroughness and accuracy.      Lelon Huh, MD  [facility] [phone] (phone) [phone] (fax)  [place]

## 2020-09-13 NOTE — Patient Instructions (Signed)
.   Please review the attached list of medications and notify my office if there are any errors.   [name] [name] to the [facility] on [facility] for bilateral knee Xray

## 2020-09-14 ENCOUNTER — Other Ambulatory Visit: Payer: Self-pay | Admitting: Family Medicine

## 2020-09-14 ENCOUNTER — Telehealth: Payer: Self-pay

## 2020-09-14 DIAGNOSIS — Z96653 Presence of artificial knee joint, bilateral: Secondary | ICD-10-CM

## 2020-09-14 DIAGNOSIS — G8929 Other chronic pain: Secondary | ICD-10-CM

## 2020-09-14 DIAGNOSIS — M25562 Pain in left knee: Secondary | ICD-10-CM

## 2020-09-14 DIAGNOSIS — M25462 Effusion, left knee: Secondary | ICD-10-CM

## 2020-09-14 NOTE — Telephone Encounter (Signed)
Pt given lab results per notes of Dr. Caryn Section on 09/14/20. Pt verbalized understanding. She says she will need a referral and goes to the orthopedic office in the [facility].    Birdie Sons, MD  09/14/2020 1:26 PM EST     Xrays show fluid building up around the prosthetic knee joint on the left, but no damage to the artificial knee joint. Recommend she see her orthopedist, can put in referral if she needs one.

## 2020-09-15 NOTE — Telephone Encounter (Signed)
FYI

## 2020-09-15 NOTE — Addendum Note (Signed)
Addended by: Randal Buba on: 09/15/2020 02:44 PM   Modules accepted: Orders

## 2020-09-15 NOTE — Telephone Encounter (Signed)
Which dx code should I use for the referral?

## 2020-09-17 NOTE — Addendum Note (Signed)
Addended by: Birdie Sons on: 09/17/2020 08:54 AM   Modules accepted: Orders

## 2020-10-01 DIAGNOSIS — M47816 Spondylosis without myelopathy or radiculopathy, lumbar region: Secondary | ICD-10-CM | POA: Diagnosis not present

## 2020-10-10 IMAGING — MR MR MRA HEAD W/O CM
1 series · 19 of 48 positions shown · non-contrast
Comparison: MR angiogram February 05, 2015.

CLINICAL DATA: Aphasia.

EXAM:
MRA HEAD WITHOUT CONTRAST
TECHNIQUE: Angiographic images of the Circle of Willis were obtained using MRA
technique without intravenous contrast.

[Series 9: TOF · axial · 0.5mm · 0.41mm/px · z∈[-86,+11]mm · 19 of 205 slices shown]
[im 1/205]
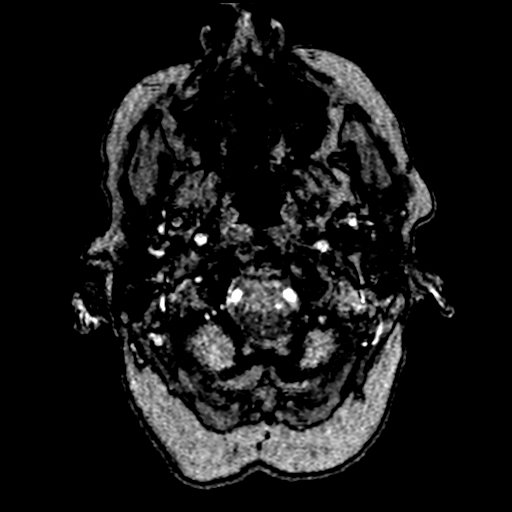
[im 5/205]
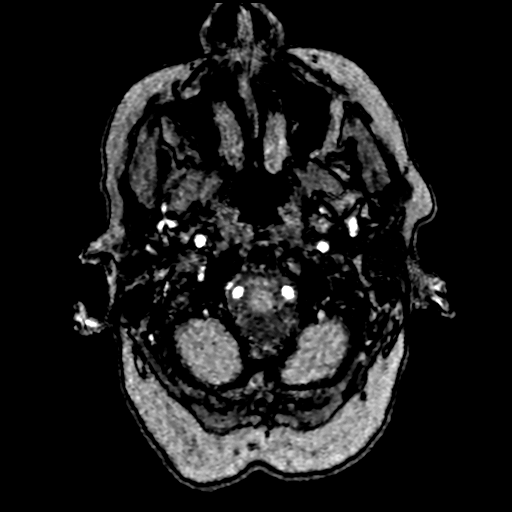
[im 9/205]
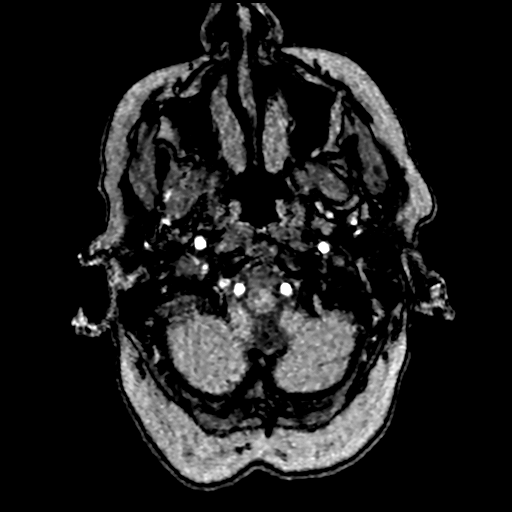
[im 14/205]
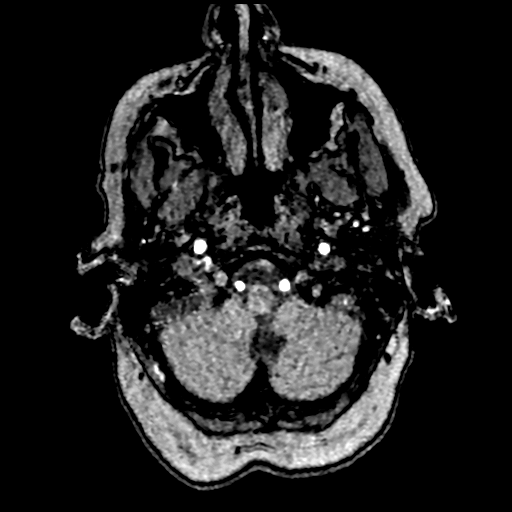
[im 18/205]
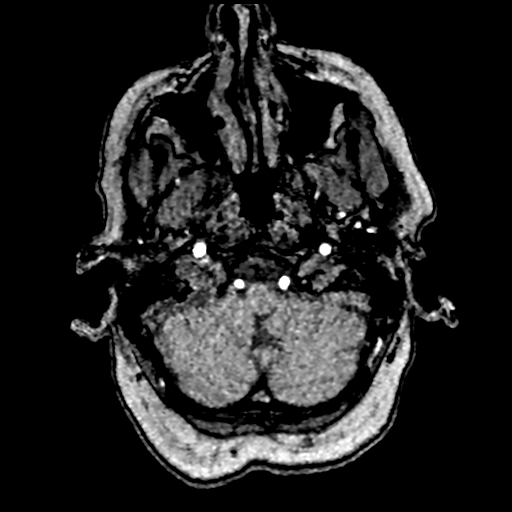
[im 22/205]
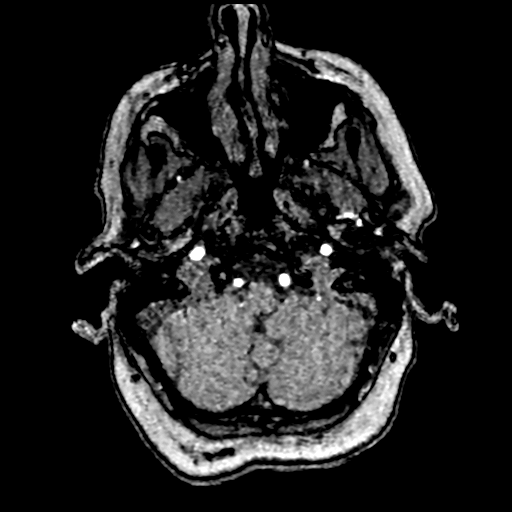
[im 27/205]
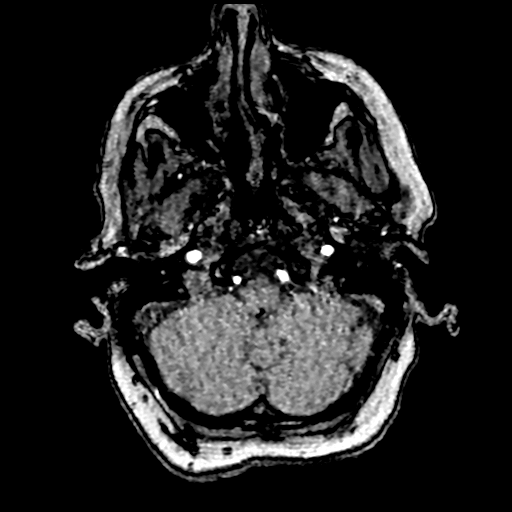
[im 31/205]
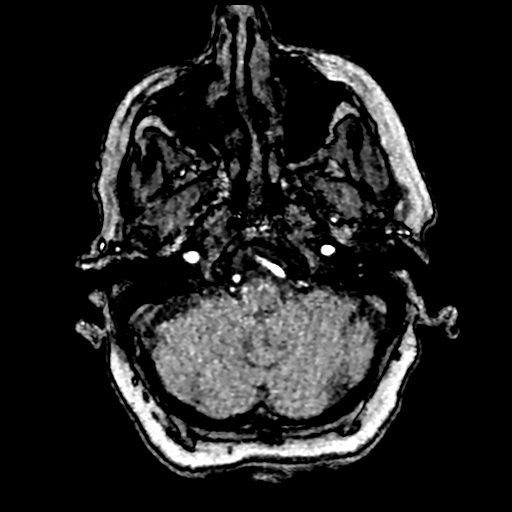
[im 35/205]
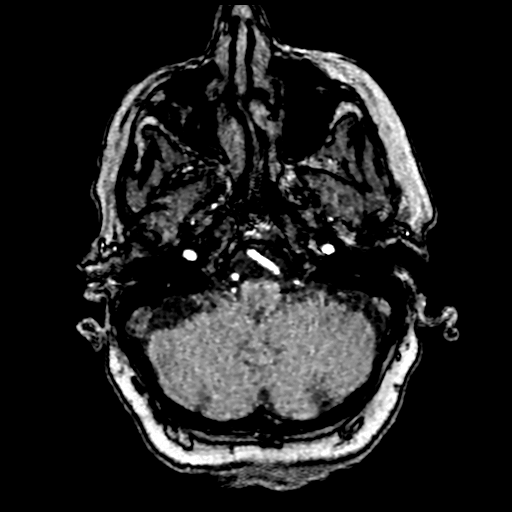
[im 40/205]
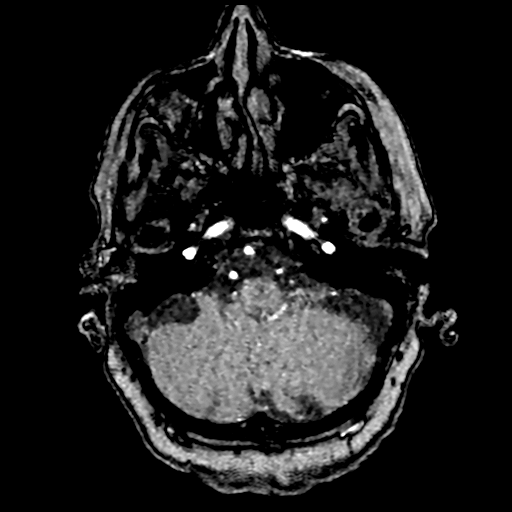
[im 44/205]
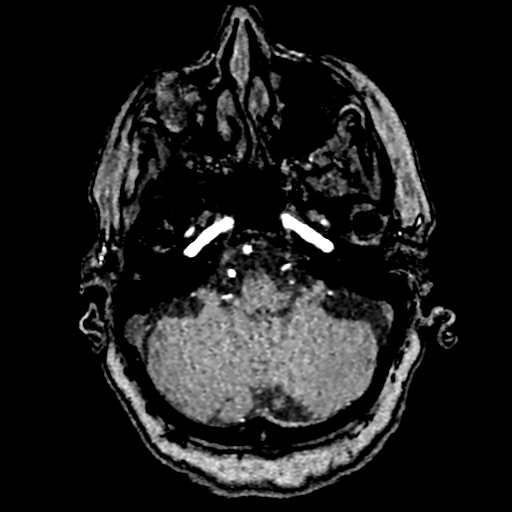
[im 66/205]
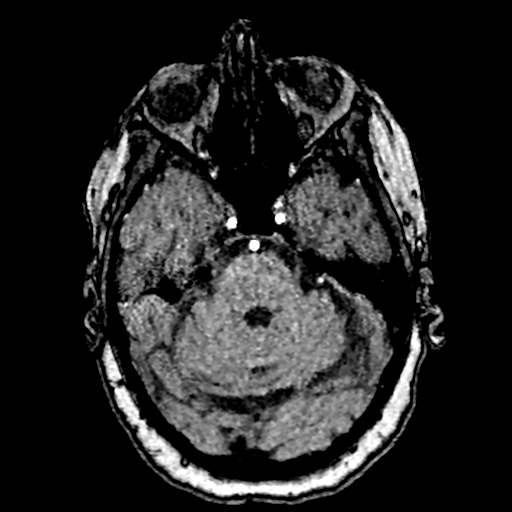
[im 92/205]
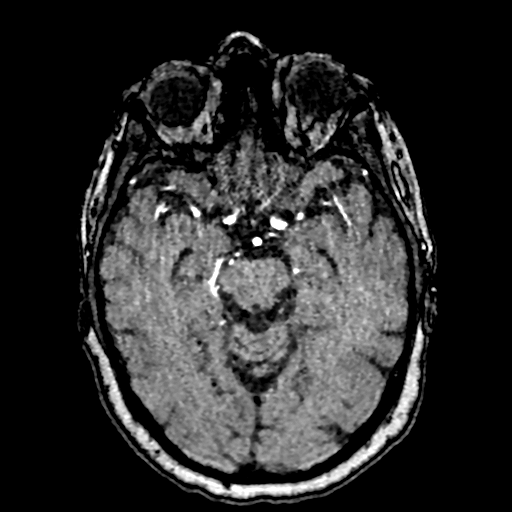
[im 105/205]
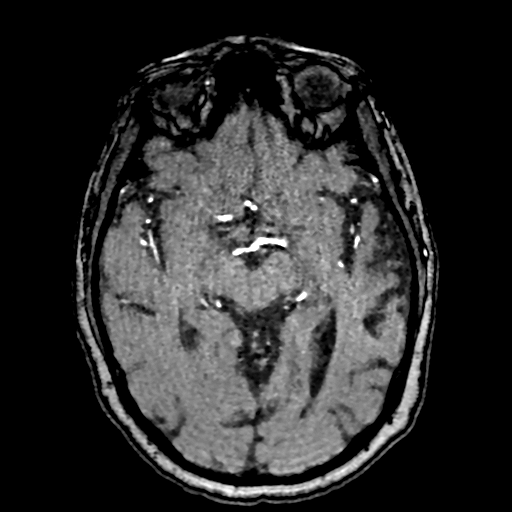
[im 118/205]
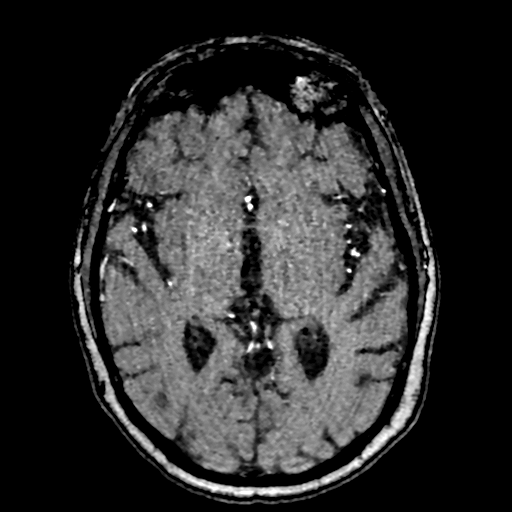
[im 144/205]
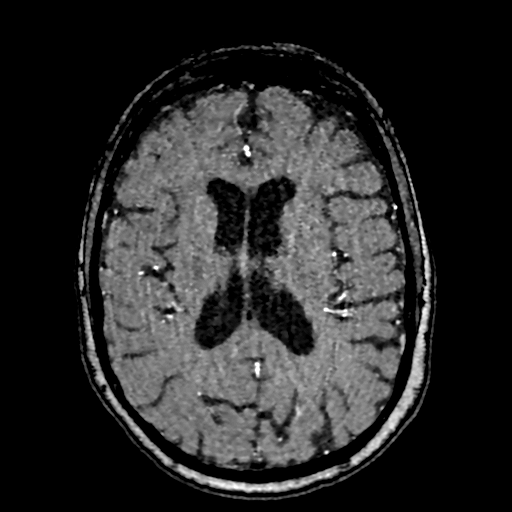
[im 170/205]
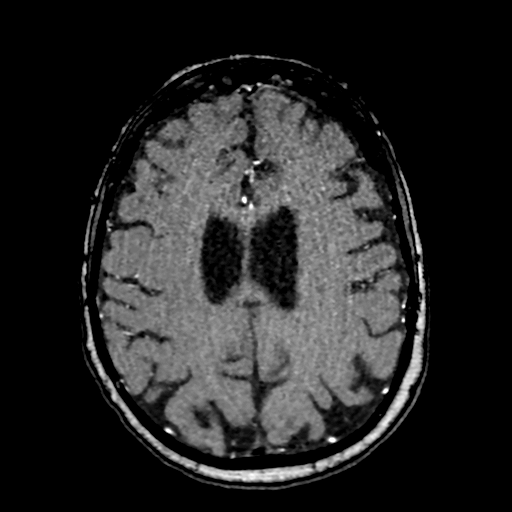
[im 174/205]
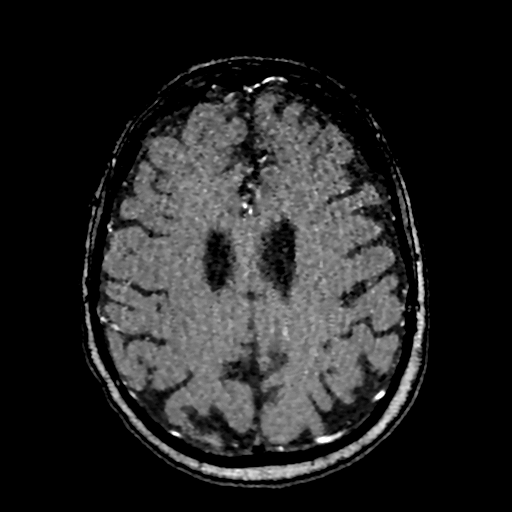
[im 196/205]
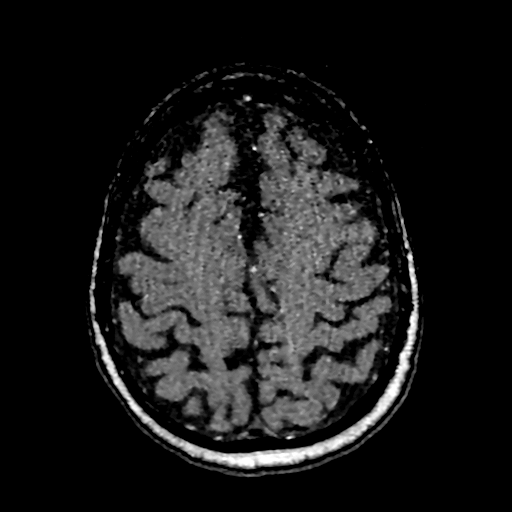

[19 of 48 positions shown; findings below may reference images not displayed]

FINDINGS: The visualized portions of the distal cervical and intracranial
internal carotid arteries are widely patent with normal flow related
enhancement. The bilateral anterior cerebral arteries and middle
cerebral arteries are widely patent with antegrade flow without
high-grade flow-limiting stenosis or proximal branch occlusion. No
intracranial aneurysm within the anterior circulation.

The vertebral arteries are widely patent with antegrade flow. The
posterior inferior cerebral arteries are normal. Vertebrobasilar
junction and basilar artery are widely patent with antegrade flow
without evidence of basilar stenosis or aneurysm. Posterior cerebral
arteries are normal bilaterally. No intracranial aneurysm within the
posterior circulation.
IMPRESSION: Normal intracranial arterial vasculature without evidence of
high-grade flow-limiting stenosis, proximal branch occlusion, or
intracranial aneurysm.

## 2020-10-18 DIAGNOSIS — M47816 Spondylosis without myelopathy or radiculopathy, lumbar region: Secondary | ICD-10-CM | POA: Diagnosis not present

## 2020-10-25 ENCOUNTER — Telehealth: Payer: Self-pay

## 2020-10-25 NOTE — Telephone Encounter (Signed)
Copied from CRM [id number]. Topic: General - Other >> Oct 25, 2020  2:13 PM Jaquita Rector A wrote: Reason for CRM: Patient called in to inquire of Dr Sherrie Mustache if he can please place a referral to Dr Sunny Schlein because the one that was placed in October was not honored. Please call patient at  Ph# [phone]

## 2020-10-25 NOTE — Telephone Encounter (Signed)
[name] Simmer, do you know the status of this referral? It was placed in Nov. Do I need to order it again? Please advise. Thanks!

## 2020-10-26 ENCOUNTER — Other Ambulatory Visit: Payer: Self-pay | Admitting: Family Medicine

## 2020-10-26 DIAGNOSIS — I10 Essential (primary) hypertension: Secondary | ICD-10-CM

## 2020-10-26 NOTE — Telephone Encounter (Signed)
Pt returned call and requested that Rx be sent to [facility] mail order pharmacy

## 2020-10-26 NOTE — Telephone Encounter (Signed)
[facility] faxed refill request for the following medications:  90 day supply  1. XARELTO 20 MG TABS tablet 2. diltiazem (CARDIZEM CD) 240 MG 24 hr capsule 3. gabapentin (NEURONTIN) 300 MG capsule 4. oxybutynin (DITROPAN) 5 MG tablet 5. metoprolol succinate (TOPROL-XL) 25 MG 24 hr tablet 6. alendronate (FOSAMAX) 70 MG tablet  Please advise. Thanks TNP

## 2020-10-26 NOTE — Telephone Encounter (Signed)
Patient has previously been getting prescriptions with [facility] pharmacy. Patient has refills remaining at that pharmacy. Tried calling patient to ask if she was switching to [facility] order pharmacy. Left message to call back. OK for [facility] triage to speak with patient to find out if she has switched pharmacies and whether she would like [place] to send prescriptions to [facility] mail order.

## 2020-10-27 NOTE — Telephone Encounter (Signed)
See note re: pt's preference to send Rx's to [facility]

## 2020-10-28 MED ORDER — OXYBUTYNIN CHLORIDE 5 MG PO TABS
5.0000 mg | ORAL_TABLET | Freq: Two times a day (BID) | ORAL | 1 refills | Status: DC
Start: 2020-10-28 — End: 2021-02-24

## 2020-10-28 MED ORDER — ALENDRONATE SODIUM 70 MG PO TABS
ORAL_TABLET | ORAL | 4 refills | Status: DC
Start: 2020-10-28 — End: 2021-02-24

## 2020-10-28 MED ORDER — DILTIAZEM HCL ER COATED BEADS 240 MG PO CP24
240.0000 mg | ORAL_CAPSULE | Freq: Every day | ORAL | 4 refills | Status: DC
Start: 2020-10-28 — End: 2021-02-24

## 2020-10-28 MED ORDER — RIVAROXABAN 20 MG PO TABS
ORAL_TABLET | ORAL | 4 refills | Status: DC
Start: 2020-10-28 — End: 2021-02-24

## 2020-10-28 MED ORDER — METOPROLOL SUCCINATE ER 25 MG PO TB24: 12.5000 mg | ORAL_TABLET | Freq: Every day | ORAL | 3 refills | Status: DC

## 2020-10-28 MED ORDER — GABAPENTIN 300 MG PO CAPS
300.0000 mg | ORAL_CAPSULE | Freq: Two times a day (BID) | ORAL | 4 refills | Status: DC
Start: 2020-10-28 — End: 2021-02-24

## 2020-10-28 NOTE — Telephone Encounter (Signed)
When the original referral was placed  it did not indicate that patient had a preference to see Dr Ernest Pine so it was sent to different office. The last referral that was placed did not show up in my WQ. Not sure what was entered wrong.I do see it once I am in the chart and can work on it from there. I have left a message for pt to return call so I can advise

## 2020-10-29 NOTE — Telephone Encounter (Signed)
Pt advised that referral was faxed to [facility]. She was given their contact information

## 2020-10-29 NOTE — Telephone Encounter (Signed)
Patient is returning your call.  

## 2020-10-29 NOTE — Telephone Encounter (Signed)
Patient is calling to check on the status of referral and goes to the orthopedic office in the [facility].  Please advise Cb- [phone]

## 2020-11-12 DIAGNOSIS — R6889 Other general symptoms and signs: Secondary | ICD-10-CM | POA: Diagnosis not present

## 2020-11-25 DIAGNOSIS — M47816 Spondylosis without myelopathy or radiculopathy, lumbar region: Secondary | ICD-10-CM | POA: Diagnosis not present

## 2020-12-07 DIAGNOSIS — Z96653 Presence of artificial knee joint, bilateral: Secondary | ICD-10-CM | POA: Diagnosis not present

## 2020-12-07 DIAGNOSIS — M5416 Radiculopathy, lumbar region: Secondary | ICD-10-CM | POA: Diagnosis not present

## 2020-12-27 DIAGNOSIS — M5416 Radiculopathy, lumbar region: Secondary | ICD-10-CM | POA: Diagnosis not present

## 2020-12-27 DIAGNOSIS — M48062 Spinal stenosis, lumbar region with neurogenic claudication: Secondary | ICD-10-CM | POA: Diagnosis not present

## 2020-12-27 DIAGNOSIS — M5136 Other intervertebral disc degeneration, lumbar region: Secondary | ICD-10-CM | POA: Diagnosis not present

## 2020-12-30 DIAGNOSIS — M5416 Radiculopathy, lumbar region: Secondary | ICD-10-CM | POA: Diagnosis not present

## 2020-12-30 DIAGNOSIS — M48062 Spinal stenosis, lumbar region with neurogenic claudication: Secondary | ICD-10-CM | POA: Diagnosis not present

## 2020-12-30 DIAGNOSIS — M5136 Other intervertebral disc degeneration, lumbar region: Secondary | ICD-10-CM | POA: Diagnosis not present

## 2021-01-21 DIAGNOSIS — M5416 Radiculopathy, lumbar region: Secondary | ICD-10-CM | POA: Diagnosis not present

## 2021-01-21 DIAGNOSIS — M85811 Other specified disorders of bone density and structure, right shoulder: Secondary | ICD-10-CM | POA: Diagnosis not present

## 2021-01-21 DIAGNOSIS — M5136 Other intervertebral disc degeneration, lumbar region: Secondary | ICD-10-CM | POA: Diagnosis not present

## 2021-01-21 DIAGNOSIS — M79622 Pain in left upper arm: Secondary | ICD-10-CM | POA: Diagnosis not present

## 2021-01-21 DIAGNOSIS — M47812 Spondylosis without myelopathy or radiculopathy, cervical region: Secondary | ICD-10-CM | POA: Diagnosis not present

## 2021-01-21 DIAGNOSIS — M48062 Spinal stenosis, lumbar region with neurogenic claudication: Secondary | ICD-10-CM | POA: Diagnosis not present

## 2021-01-21 DIAGNOSIS — M19011 Primary osteoarthritis, right shoulder: Secondary | ICD-10-CM | POA: Diagnosis not present

## 2021-01-21 DIAGNOSIS — M4312 Spondylolisthesis, cervical region: Secondary | ICD-10-CM | POA: Diagnosis not present

## 2021-01-21 DIAGNOSIS — M19012 Primary osteoarthritis, left shoulder: Secondary | ICD-10-CM | POA: Diagnosis not present

## 2021-01-27 ENCOUNTER — Ambulatory Visit: Payer: Self-pay | Admitting: *Deleted

## 2021-01-27 NOTE — Telephone Encounter (Signed)
Pt is calling to ask can she take more than what is prescribed of oxybutynin (DITROPAN) 5 MG tablet [[id number]] ?   Patient is asking if she can take ditropan 5 mg more than twice a day due to some days she has urgency urinating. Patient is going to a conference next week and driving and would like to know if she can take extra dose of ditropan if needed so she will not have to stop so often to go to the bathroom.  Patient will be going to conference this Monday, Tuesday and Wednesday. Please advise. Patient also reporting she fell in her ,  Shower in March 12 and hit the back left side of her head and noted a "bump" the size of a "golf ball" and "bump" went away but she can still feel a little bump that has not went away. Denies headaches, dizziness, pain or visual disturbances, . Patient reports she does have decrease balance but she has had poor balance since her stroke. Patient asking if she needs appt with PCP regarding hitting her head a month ago. Attempted to schedule appt for patient but she would like to know if PCP feels she should be seen. Care advise given. Patient verbalized understanding to call back or seek help if symptoms noted due to hitting her head . Please advise .  Reason for Disposition . [1] Caller has NON-URGENT medicine question about med that PCP prescribed AND [2] triager unable to answer question  Answer Assessment - Initial Assessment Questions 1. NAME of MEDICATION: "What medicine are you calling about?"     Ditropan 5mg  twice daily 2. QUESTION: "What is your question?" (e.g., medication refill, side effect)     Can patient take more than twice a day? 3. PRESCRIBING HCP: "Who prescribed it?" Reason: if prescribed by specialist, call should be referred to that group.     PCP 4. SYMPTOMS: "Do you have any symptoms?"     Some days more urgency with urination than others 5. SEVERITY: If symptoms are present, ask "Are they mild, moderate or severe?"     na 6. PREGNANCY:   "Is there any chance that you are pregnant?" "When was your last menstrual period?"     na  Protocols used: MEDICATION QUESTION CALL-A-AH

## 2021-01-28 NOTE — Telephone Encounter (Signed)
Patient returned call- notified of provider response.

## 2021-01-28 NOTE — Telephone Encounter (Signed)
Called patient and no answer, [name] [name]. If patient calls back okay for PEC to advise.

## 2021-01-28 NOTE — Telephone Encounter (Signed)
Please review. Ok to increase oxybutynin?

## 2021-01-28 NOTE — Telephone Encounter (Signed)
She can one tablet up to three times a day if needed.

## 2021-02-07 ENCOUNTER — Telehealth: Payer: Self-pay

## 2021-02-07 ENCOUNTER — Telehealth (INDEPENDENT_AMBULATORY_CARE_PROVIDER_SITE_OTHER): Payer: Medicare Other | Admitting: Family Medicine

## 2021-02-07 DIAGNOSIS — J40 Bronchitis, not specified as acute or chronic: Secondary | ICD-10-CM

## 2021-02-07 DIAGNOSIS — U071 COVID-19: Secondary | ICD-10-CM

## 2021-02-07 MED ORDER — AZITHROMYCIN 250 MG PO TABS: ORAL_TABLET | ORAL | 0 refills | Status: DC

## 2021-02-07 NOTE — Progress Notes (Signed)
Virtual telephone visit    Virtual Visit via Telephone Note   This visit type was conducted due to national recommendations for restrictions regarding the COVID-19 Pandemic (e.g. social distancing) in an effort to limit this patient's exposure and mitigate transmission in our community. Due to her co-morbid illnesses, this patient is at least at moderate risk for complications without adequate follow up. This format is felt to be most appropriate for this patient at this time. The patient did not have access to video technology or had technical difficulties with video requiring transitioning to audio format only (telephone). Physical exam was limited to content and character of the telephone converstion.    Patient location: home Provider location: [facility]  I discussed the limitations of evaluation and management by telemedicine and the availability of in person appointments. The patient expressed understanding and agreed to proceed.   Visit Date: 02/07/2021  Today's healthcare provider: Lelon Huh, MD   Chief Complaint  Patient presents with  . URI   Subjective    URI  This is a new problem. The current episode started in the past 7 days. The problem has been gradually worsening. There has been no fever. Associated symptoms include congestion, coughing and wheezing. Pertinent negatives include no abdominal pain, chest pain, diarrhea, dysuria, ear pain, headaches, neck pain, plugged ear sensation, rhinorrhea, sinus pain, sneezing, sore throat, swollen glands or vomiting.   cough is non-productive. No fever, chills, or sweats. Not taking any OTC medications. Was at a conference at [employer] last week. Has had two dose of Covid     Patient Active Problem List   Diagnosis Date Noted  . Spinal stenosis of lumbosacral region 09/11/2019  . Expressive aphasia 03/11/2019  . Irritable bladder 09/12/2018  . Lumbar radiculopathy 05/13/2018  . Chronic pain syndrome 05/13/2018  .  Lumbar degenerative disc disease 05/13/2018  . Osteoporosis 08/02/2016  . History of colon polyps 08/02/2016  . Low back pain 06/20/2016  . S/P total knee arthroplasty 03/01/2016  . Knee osteoarthritis 02/16/2016  . Weakness 09/27/2015  . 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 History of CVA (cerebrovascular accident) 02/16/2014  . Seizures ([place]) 02/16/2014  . Chronic leg pain 02/16/2014  . Fatigue 07/02/2013  . HYPERTENSION, BENIGN 12/07/2010  . Atrial fibrillation, chronic 06/29/2009   Past Medical History:  Diagnosis Date  . Arthritis    osteo; in B knees;   . Bladder disorder   . Chicken pox   . Chronic a-fib ([place])   . DVT (deep venous thrombosis) ([place])    H/O  . Dyspnea   . Dysrhythmia    a fib  . Edema   . Hay fever   . Heart murmur   . Hypertension    controlled  . Irregular heart beat   . Scoliosis   . Seizure ([place])    2 years ago knocked unconscious; hospitalized and diagnosed with seizures; no spells in last year;   . Sepsis ([place]) 03/11/2019  . Stroke [facility])    1 month ago (possible stroke)  . Unspecified atrial fibrillation (H[facility])    Social History   Tobacco Use  . Smoking status: Former Smoker    Quit date: 04/11/1972    Years since quitting: 48.8  . Smokeless tobacco: Never Used  Vaping Use  . Vaping Use: Never used  Substance Use Topics  . Alcohol use: No    Alcohol/week: 0.0 standard drinks  . Drug use: No  Allergies  Allergen Reactions  . Aspirin Other (See Comments)    On xarelto      Medications: Outpatient Medications Prior to Visit  Medication Sig  . acetaminophen (TYLENOL) 650 MG CR tablet Take 650 mg by mouth every 8 (eight) hours as needed for pain.  Marland Kitchen alendronate (FOSAMAX) 70 MG tablet TAKE 1 TABLET BY MOUTH WEEKLY IN THE MORNING WITH A FULL GLASS OF WATER. DO NOT EAT, DRINK OR LIE DOWN FOR 30 MIN.  Marland Kitchen amoxicillin (AMOXIL) 500 MG capsule Take 4 tablets one hour before procedure (Patient not taking: Reported  on 09/13/2020)  . Artificial Tear Solution (SOOTHE XP XTRA PROTECTION) SOLN Apply 1 drop to eye as needed (both eyes).   . B Complex-Biotin-FA (EQL B COMPLEX 100) TABS daily.   . Biotin 10 MG CAPS Take by mouth daily.  . bisacodyl (DULCOLAX) 5 MG EC tablet Take 5 mg by mouth daily as needed for moderate constipation.  . Calcium-Magnesium-Vitamin D (CALCIUM 1200+D3 PO) Take 1,200 mg by mouth daily. Citracal ER  . cholecalciferol (VITAMIN D) 1000 units tablet Take 1,000 Units by mouth daily.  . Coenzyme Q10 (CO Q-10) 100 MG CAPS Take 100 mg by mouth daily.   Marland Kitchen diltiazem (CARDIZEM CD) 240 MG 24 hr capsule Take 1 capsule (240 mg total) by mouth daily.  Marland Kitchen gabapentin (NEURONTIN) 300 MG capsule Take 1 capsule (300 mg total) by mouth 2 (two) times daily.  Marland Kitchen HYDROcodone-acetaminophen (NORCO/VICODIN) 5-325 MG tablet Take 1 tablet by mouth 2 (two) times daily as needed.  . Lactobacillus (ACIDOPHILUS PO) Take 1 tablet by mouth at bedtime.   . lidocaine (LIDODERM) 5 % Place 1 patch onto the skin daily. Remove & Discard patch within 12 hours  . MEGARED OMEGA-3 KRILL OIL PO Take 300 mg by mouth daily.   . Menthol, Topical Analgesic, (ICY HOT BACK EX) Apply topically as needed.  . metoprolol succinate (TOPROL-XL) 25 MG 24 hr tablet Take 0.5 tablets (12.5 mg total) by mouth daily.  Marland Kitchen oxybutynin (DITROPAN) 5 MG tablet Take 1 tablet (5 mg total) by mouth 2 (two) times daily.  . rivaroxaban (XARELTO) 20 MG TABS tablet TAKE 1 TABLET BY MOUTH DAILY WITH SUPPER  . traMADol (ULTRAM) 50 MG tablet Take 1 tablet (50 mg total) by mouth every 6 (six) hours as needed.  . TURMERIC PO Take 800 mg by mouth 2 (two) times daily.  . Zinc 50 MG CAPS Take 25 mg by mouth daily.    No facility-administered medications prior to visit.    Review of Systems  Constitutional: Positive for fatigue. Negative for activity change, appetite change, chills, diaphoresis, fever and unexpected weight change.  HENT: Positive for congestion.  Negative for ear pain, rhinorrhea, sinus pain, sneezing and sore throat.   Respiratory: Positive for cough, shortness of breath and wheezing. Negative for apnea, choking, chest tightness and stridor.   Cardiovascular: Negative.  Negative for chest pain.  Gastrointestinal: Negative.  Negative for abdominal pain, diarrhea and vomiting.  Genitourinary: Negative for dysuria.  Musculoskeletal: Negative for neck pain.  Neurological: Negative for dizziness, light-headedness and headaches.      Objective    There were no vitals taken for this visit.  Awake, alert, oriented x 3. In no apparent distress   Assessment & Plan     1. Bronchitis  - azithromycin (ZITHROMAX) 250 MG tablet; Take one tablet twice today.  Then, take 1 tablet daily for 4 days.  Dispense: 6 tablet; Refill: 0   She  has had two doses of Covid vaccine. Encouraged her to get Covid testing ASAP.      I discussed the assessment and treatment plan with the patient. The patient was provided an opportunity to ask questions and all were answered. The patient agreed with the plan and demonstrated an understanding of the instructions.   The patient was advised to call back or seek an in-person evaluation if the symptoms worsen or if the condition fails to improve as anticipated.  I provided 19 minutes of non-face-to-face time during this encounter.  The entirety of the information documented in the History of Present Illness, Review of Systems and Physical Exam were personally obtained by me. Portions of this information were initially documented by the CMA and reviewed by me for thoroughness and accuracy.     Lelon Huh, MD [facility] [phone] (phone) [phone] (fax)  [place]

## 2021-02-07 NOTE — Telephone Encounter (Signed)
I she is having any shortness of breath then go to urgent care. Otherwise she can have my 4pm slot this afternoon for a virtual visit. She should probably go get a Covid test if she hasn't had one yet.

## 2021-02-07 NOTE — Telephone Encounter (Signed)
Copied from [place] [id number]. Topic: General - Other >> Feb 07, 2021 10:59 AM Pawlus, Brayton Layman A wrote: Reason for CRM: Pt needs a virtual appt as soon as possible, she stated she is very sick and thinks she might have COVID. No virtual visits until April 25th, Pt said she cannot wait that long. Can the pt be worked in with [facility]? Pt wants a call back.

## 2021-02-07 NOTE — Telephone Encounter (Signed)
Pt advised apt scheduled for 4pm this afternoon.   Thanks,   -Mickel Baas

## 2021-02-08 NOTE — Addendum Note (Signed)
Addended by: Birdie Sons on: 02/08/2021 03:26 PM   Modules accepted: Orders

## 2021-02-08 NOTE — Telephone Encounter (Signed)
Ok, have sent order to [facility] treatment center and they contact her in the next day or two about treatment options.

## 2021-02-08 NOTE — Telephone Encounter (Signed)
Pt calling in to update PCP. She states that she did test positive for covid. She states that she is wondering if this is the new strain or the old one.  Please advise.

## 2021-02-09 ENCOUNTER — Telehealth: Payer: Self-pay

## 2021-02-09 NOTE — Telephone Encounter (Signed)
Called to discuss with patient about COVID-19 symptoms and the use of one of the available treatments for those with mild to moderate Covid symptoms and at a high risk of hospitalization.  Pt appears to qualify for outpatient treatment due to co-morbid conditions and/or a member of an at-risk group in accordance with the FDA Emergency Use Authorization.    Symptom onset: Cough 02/05/21 Vaccinated: Yes Booster? No Immunocompromised? No Qualifiers: A-fib  Declines any further treatment.   [name] [name]

## 2021-02-09 NOTE — Telephone Encounter (Signed)
Patient called and verbalized understanding of information below. Patient said covid center called her and told her to stay home.

## 2021-02-22 ENCOUNTER — Encounter: Payer: Self-pay | Admitting: Family Medicine

## 2021-02-22 ENCOUNTER — Ambulatory Visit (INDEPENDENT_AMBULATORY_CARE_PROVIDER_SITE_OTHER): Payer: Medicare Other | Admitting: Family Medicine

## 2021-02-22 ENCOUNTER — Ambulatory Visit: Payer: Self-pay | Admitting: *Deleted

## 2021-02-22 ENCOUNTER — Telehealth: Payer: Self-pay

## 2021-02-22 DIAGNOSIS — Z5329 Procedure and treatment not carried out because of patient's decision for other reasons: Secondary | ICD-10-CM

## 2021-02-22 NOTE — Progress Notes (Signed)
No Show. No answer with several phone calls attempts before, during and after appointment. Time. Patient was left VM

## 2021-02-22 NOTE — Telephone Encounter (Signed)
Copied from [place] [id number]. Topic: General - Other >> Feb 22, 2021  3:49 PM Mcneil, Ja-Kwan wrote: Reason for CRM: Pt stated she was trying to call back because there was a problem when trying to connect for her appt with Dr. Caryn Section. Pt requests call back

## 2021-02-22 NOTE — Telephone Encounter (Signed)
Tried calling patient again at [phone], no answer again.

## 2021-02-22 NOTE — Telephone Encounter (Signed)
Left arm above my elbow hurts so badly it's hard to move.   It hurts bad.   Happening for 2-3 days now.  No accidents, or injuries.  I can't even go to the salad bar or use my walker.     She has had problems with this arm in the past and got a cortisone shot in it.   "That really helped a lot but then it started hurting again".    She had covid recently per pt.  The covid questionnaire indicated a virtual visit.  She is no longer having symptoms per pt.   "I only had a very mild case".     She  Does not use MyChart.   "He calls me". I made her an appt for today at 3:20 with Dr. Caryn Section for a phone call visit.  See triage notes.  I sent my notes to [facility] for Dr. Caryn Section.  Reason for Disposition . [1] MODERATE pain (e.g., interferes with normal activities) AND [2] present > 3 days  Answer Assessment - Initial Assessment Questions 1. ONSET: "When did the pain start?"     2-3 days ago 2. LOCATION: "Where is the pain located?"     Left arm above my elbow hurts so bad.   I can't hardly dress myself without help. 3. PAIN: "How bad is the pain?" (Scale 1-10; or mild, moderate, severe)   - MILD (1-3): doesn't interfere with normal activities   - MODERATE (4-7): interferes with normal activities (e.g., work or school) or awakens from sleep   - SEVERE (8-10): excruciating pain, unable to do any normal activities, unable to hold a cup of water     Severe   I hold it close to my body. 4. WORK OR EXERCISE: "Has there been any recent work or exercise that involved this part of the body?"     No 5. CAUSE: "What do you think is causing the arm pain?"     I don't know 6. OTHER SYMPTOMS: "Do you have any other symptoms?" (e.g., neck pain, swelling, rash, fever, numbness, weakness)     Weakness in left arm and pain.   No numbness or tingling in left hand. 7. PREGNANCY: "Is there any chance you are pregnant?" "When was your last menstrual period?"     N/A  Protocols used: ARM  PAIN-A-AH

## 2021-02-22 NOTE — Telephone Encounter (Signed)
Pt called in again and was transferred to nurse triage.   I spoke with her and scheduled her an appt regarding her left arm pain.

## 2021-02-24 ENCOUNTER — Other Ambulatory Visit: Payer: Self-pay | Admitting: Family Medicine

## 2021-02-24 DIAGNOSIS — I10 Essential (primary) hypertension: Secondary | ICD-10-CM

## 2021-02-24 MED ORDER — RIVAROXABAN 20 MG PO TABS: ORAL_TABLET | ORAL | 3 refills | Status: DC

## 2021-02-24 MED ORDER — OXYBUTYNIN CHLORIDE 5 MG PO TABS: 5.0000 mg | ORAL_TABLET | Freq: Two times a day (BID) | ORAL | 1 refills | Status: DC

## 2021-02-24 MED ORDER — METOPROLOL SUCCINATE ER 25 MG PO TB24: 12.5000 mg | ORAL_TABLET | Freq: Every day | ORAL | 2 refills | Status: DC

## 2021-02-24 MED ORDER — DILTIAZEM HCL ER COATED BEADS 240 MG PO CP24
240.0000 mg | ORAL_CAPSULE | Freq: Every day | ORAL | 2 refills | Status: DC
Start: 2021-02-24 — End: 2021-09-01

## 2021-02-24 MED ORDER — GABAPENTIN 300 MG PO CAPS
300.0000 mg | ORAL_CAPSULE | Freq: Two times a day (BID) | ORAL | 3 refills | Status: DC
Start: 2021-02-24 — End: 2021-09-22

## 2021-02-24 MED ORDER — ALENDRONATE SODIUM 70 MG PO TABS: ORAL_TABLET | ORAL | 3 refills | Status: DC

## 2021-02-24 NOTE — Telephone Encounter (Signed)
Medication: gabapentin (NEURONTIN) 300 MG capsule [[id number]] , oxybutynin (DITROPAN) 5 MG tablet [[id number]] , metoprolol succinate (TOPROL-XL) 25 MG 24 hr tablet [[id number]] , diltiazem (CARDIZEM CD) 240 MG 24 hr capsule [[id number]] , rivaroxaban (XARELTO) 20 MG TABS tablet [[id number]] , alendronate (FOSAMAX) 70 MG tablet [[id number]]   Has the patient contacted their pharmacy? YES  (Agent: If no, request that the patient contact the pharmacy for the refill.) (Agent: If yes, when and what did the pharmacy advise?)  Preferred Pharmacy (with phone number or street name): [email or web address] fax [phone]   Agent: Please be advised that RX refills may take up to 3 business days. We ask that you follow-up with your pharmacy.

## 2021-02-24 NOTE — Telephone Encounter (Signed)
Future visit in 2 weeks. Last labs ALT/AST 03/11/2019 and last vit D lab 09/12/2018

## 2021-02-28 ENCOUNTER — Ambulatory Visit: Payer: Self-pay

## 2021-02-28 NOTE — Telephone Encounter (Signed)
Patient called and says she fell today and thinks she injured her left arm and left hip. She says she fell 2 weeks ago and her left arm has been hurting since then, but now it's like her arm is close to her body and she can't move it as well as before. She says her walker collapsed and that's what made her fall as she was reaching down for her keys. She says she didn't hit her head, is not dizzy or any other symptoms. She says she has an appointment on 03/11/21 with Dr. Caryn Section but was wanting to be seen sooner by him or his PA. I advised no available appointments this week with Dr. Caryn Section or Vernie Murders, [place]. I called the office and spoke to [facility], [facility] who advised the patient could go to [facility] for x-rays or the UC. I advised the patient, she says she will go if she is able. I advised to keep the 5/20 appointment with Dr. Caryn Section.  Reason for Disposition . MILD weakness (i.e., does not interfere with ability to work, go to school, normal activities) (Exception: mild weakness is a chronic symptom)  Answer Assessment - Initial Assessment Questions 1. MECHANISM: "How did the fall happen?"     Tripped and fell at the door stoop, 3 wheel walker collapsed 2. DOMESTIC VIOLENCE AND ELDER ABUSE SCREENING: "Did you fall because someone pushed you or tried to hurt you?" If Yes, ask: "Are you safe now?"     No 3. ONSET: "When did the fall happen?" (e.g., minutes, hours, or days ago)     Today 4. LOCATION: "What part of the body hit the ground?" (e.g., back, buttocks, head, hips, knees, hands, head, stomach)     Left arm and hip 5. INJURY: "Did you hurt (injure) yourself when you fell?" If Yes, ask: "What did you injure? Tell me more about this?" (e.g., body area; type of injury; pain severity)"     Yes, feel like the left upper arm and left hip 6. PAIN: "Is there any pain?" If Yes, ask: "How bad is the pain?" (e.g., Scale 1-10; or mild,  moderate, severe)   - NONE (0): No pain   - MILD (1-3): Doesn't  interfere with normal activities    - MODERATE (4-7): Interferes with normal activities or awakens from sleep    - SEVERE (8-10): Excruciating pain, unable to do any normal activities      Yes, depends on what I'm doing it gets up to 10 and this has been since the last fall a few weeks ago 7. SIZE: For cuts, bruises, or swelling, ask: "How large is it?" (e.g., inches or centimeters)      No 8. PREGNANCY: "Is there any chance you are pregnant?" "When was your last menstrual period?"     No 9. OTHER SYMPTOMS: "Do you have any other symptoms?" (e.g., dizziness, fever, weakness; new onset or worsening).      Yes, weaker in left hand getting worse 10. CAUSE: "What do you think caused the fall (or falling)?" (e.g., tripped, dizzy spell)      Walker started to collapse  Protocols used: FALLS AND FALLING-A-AH

## 2021-02-28 NOTE — Telephone Encounter (Signed)
Patient called and mentioned to the [place], [place] that she fell and has some other symptoms going on and mentioned a stroke. The patient hung up while the call was connecting to NT. I called the patient back x 3, no answer, mailbox full and not able to leave messages at this time per recording.

## 2021-03-02 DIAGNOSIS — M8580 Other specified disorders of bone density and structure, unspecified site: Secondary | ICD-10-CM | POA: Diagnosis not present

## 2021-03-02 DIAGNOSIS — Z7901 Long term (current) use of anticoagulants: Secondary | ICD-10-CM | POA: Diagnosis not present

## 2021-03-02 DIAGNOSIS — I4891 Unspecified atrial fibrillation: Secondary | ICD-10-CM | POA: Diagnosis not present

## 2021-03-02 DIAGNOSIS — Z8673 Personal history of transient ischemic attack (TIA), and cerebral infarction without residual deficits: Secondary | ICD-10-CM | POA: Diagnosis not present

## 2021-03-02 DIAGNOSIS — G894 Chronic pain syndrome: Secondary | ICD-10-CM | POA: Diagnosis not present

## 2021-03-02 DIAGNOSIS — M48062 Spinal stenosis, lumbar region with neurogenic claudication: Secondary | ICD-10-CM | POA: Diagnosis not present

## 2021-03-02 DIAGNOSIS — I10 Essential (primary) hypertension: Secondary | ICD-10-CM | POA: Diagnosis not present

## 2021-03-02 DIAGNOSIS — I671 Cerebral aneurysm, nonruptured: Secondary | ICD-10-CM | POA: Diagnosis not present

## 2021-03-02 DIAGNOSIS — M5116 Intervertebral disc disorders with radiculopathy, lumbar region: Secondary | ICD-10-CM | POA: Diagnosis not present

## 2021-03-08 NOTE — Progress Notes (Signed)
Established patient visit   Patient: [name] [name]   DOB: 1941/05/08   80 y.o. Female  MRN: [id number] Visit Date: 03/09/2021  Today's healthcare provider: Lelon Huh, MD   No chief complaint on file.  Subjective      Fall: Hip and arm injury Patient fell a few weeks ago at home and injured left arm and left hip. Patient says the pain feels like a burning pain and radiates down her left leg. Patient is currently using heat, ice, and acetaminophen to control the pain with minimal relief.     Medications: Outpatient Medications Prior to Visit  Medication Sig  . acetaminophen (TYLENOL) 650 MG CR tablet Take 650 mg by mouth every 8 (eight) hours as needed for pain.  Marland Kitchen alendronate (FOSAMAX) 70 MG tablet TAKE 1 TABLET BY MOUTH WEEKLY IN THE MORNING WITH A FULL GLASS OF WATER. DO NOT EAT, DRINK OR LIE DOWN FOR 30 MIN.  Marland Kitchen amoxicillin (AMOXIL) 500 MG capsule Take 4 tablets one hour before procedure (Patient not taking: No sig reported)  . Artificial Tear Solution (SOOTHE XP XTRA PROTECTION) SOLN Apply 1 drop to eye as needed (both eyes).   Marland Kitchen azithromycin (ZITHROMAX) 250 MG tablet Take one tablet twice today.  Then, take 1 tablet daily for 4 days.  . B Complex-Biotin-FA (EQL B COMPLEX 100) TABS daily.   . Biotin 10 MG CAPS Take by mouth daily.  . bisacodyl (DULCOLAX) 5 MG EC tablet Take 5 mg by mouth daily as needed for moderate constipation.  . Calcium-Magnesium-Vitamin D (CALCIUM 1200+D3 PO) Take 1,200 mg by mouth daily. Citracal ER  . cholecalciferol (VITAMIN D) 1000 units tablet Take 1,000 Units by mouth daily.  . Coenzyme Q10 (CO Q-10) 100 MG CAPS Take 100 mg by mouth daily.   Marland Kitchen diltiazem (CARDIZEM CD) 240 MG 24 hr capsule Take 1 capsule (240 mg total) by mouth daily.  Marland Kitchen gabapentin (NEURONTIN) 300 MG capsule Take 1 capsule (300 mg total) by mouth 2 (two) times daily.  Marland Kitchen HYDROcodone-acetaminophen (NORCO/VICODIN) 5-325 MG tablet Take 1 tablet by mouth 2 (two) times daily as  needed.  . Lactobacillus (ACIDOPHILUS PO) Take 1 tablet by mouth at bedtime.   . lidocaine (LIDODERM) 5 % Place 1 patch onto the skin daily. Remove & Discard patch within 12 hours  . MEGARED OMEGA-3 KRILL OIL PO Take 300 mg by mouth daily.   . Menthol, Topical Analgesic, (ICY HOT BACK EX) Apply topically as needed.  . metoprolol succinate (TOPROL-XL) 25 MG 24 hr tablet Take 0.5 tablets (12.5 mg total) by mouth daily.  Marland Kitchen oxybutynin (DITROPAN) 5 MG tablet Take 1 tablet (5 mg total) by mouth 2 (two) times daily.  . rivaroxaban (XARELTO) 20 MG TABS tablet TAKE 1 TABLET BY MOUTH DAILY WITH SUPPER  . traMADol (ULTRAM) 50 MG tablet Take 1 tablet (50 mg total) by mouth every 6 (six) hours as needed.  . TURMERIC PO Take 800 mg by mouth 2 (two) times daily.  . Zinc 50 MG CAPS Take 25 mg by mouth daily.    No facility-administered medications prior to visit.    Review of Systems  Musculoskeletal: Positive for arthralgias (left arm and left hip).       Objective    BP (!) 141/100 (BP Location: Right Arm, Patient Position: Sitting, Cuff Size: Small)   Pulse 100   Ht 4\' 10"  (1.473 m)   Wt 163 lb (73.9 kg)   SpO2 100%  BMI 34.07 kg/m     Physical Exam  Slow to stand from sitting position favoring right leg when walking. Large contusion along side of left hip with mild tenderness. No swelling. Diffuse left upper arm pain with difficult raising arm about level of hip.      Assessment & Plan     1. Left hip pain  - DG Hip Unilat W OR W/O Pelvis 2-3 Views Left; Future  2. Left arm pain  - DG Humerus Left; Future - DG Elbow Complete Left; Future         The entirety of the information documented in the History of Present Illness, Review of Systems and Physical Exam were personally obtained by me. Portions of this information were initially documented by the CMA and reviewed by me for thoroughness and accuracy.      Lelon Huh, MD  [facility] [phone]  (phone) [phone] (fax)  [place]

## 2021-03-08 NOTE — Telephone Encounter (Signed)
Error

## 2021-03-09 ENCOUNTER — Other Ambulatory Visit: Payer: Self-pay

## 2021-03-09 ENCOUNTER — Ambulatory Visit
Admission: RE | Admit: 2021-03-09 | Discharge: 2021-03-09 | Disposition: A | Discharge status: Home / Self Care | Payer: Medicare Other | Attending: Family Medicine | Admitting: Family Medicine

## 2021-03-09 ENCOUNTER — Ambulatory Visit
Admission: RE | Admit: 2021-03-09 | Discharge: 2021-03-09 | Disposition: A | Discharge status: Home / Self Care | Payer: Medicare Other | Source: Ambulatory Visit | Attending: Family Medicine | Admitting: Family Medicine

## 2021-03-09 ENCOUNTER — Ambulatory Visit (INDEPENDENT_AMBULATORY_CARE_PROVIDER_SITE_OTHER): Payer: Medicare Other | Admitting: Family Medicine

## 2021-03-09 VITALS — BP 141/100 | HR 100 | Ht <= 58 in | Wt 163.0 lb

## 2021-03-09 DIAGNOSIS — M79622 Pain in left upper arm: Secondary | ICD-10-CM | POA: Diagnosis not present

## 2021-03-09 DIAGNOSIS — Z7901 Long term (current) use of anticoagulants: Secondary | ICD-10-CM | POA: Diagnosis not present

## 2021-03-09 DIAGNOSIS — G894 Chronic pain syndrome: Secondary | ICD-10-CM | POA: Diagnosis not present

## 2021-03-09 DIAGNOSIS — M25522 Pain in left elbow: Secondary | ICD-10-CM | POA: Diagnosis not present

## 2021-03-09 DIAGNOSIS — M25552 Pain in left hip: Secondary | ICD-10-CM | POA: Diagnosis not present

## 2021-03-09 DIAGNOSIS — M47816 Spondylosis without myelopathy or radiculopathy, lumbar region: Secondary | ICD-10-CM | POA: Diagnosis not present

## 2021-03-09 DIAGNOSIS — M79602 Pain in left arm: Secondary | ICD-10-CM

## 2021-03-09 DIAGNOSIS — M8580 Other specified disorders of bone density and structure, unspecified site: Secondary | ICD-10-CM | POA: Diagnosis not present

## 2021-03-09 DIAGNOSIS — I671 Cerebral aneurysm, nonruptured: Secondary | ICD-10-CM | POA: Diagnosis not present

## 2021-03-09 DIAGNOSIS — Z8673 Personal history of transient ischemic attack (TIA), and cerebral infarction without residual deficits: Secondary | ICD-10-CM | POA: Diagnosis not present

## 2021-03-09 DIAGNOSIS — M4186 Other forms of scoliosis, lumbar region: Secondary | ICD-10-CM | POA: Diagnosis not present

## 2021-03-09 DIAGNOSIS — I10 Essential (primary) hypertension: Secondary | ICD-10-CM | POA: Diagnosis not present

## 2021-03-09 DIAGNOSIS — M5116 Intervertebral disc disorders with radiculopathy, lumbar region: Secondary | ICD-10-CM | POA: Diagnosis not present

## 2021-03-09 DIAGNOSIS — I4891 Unspecified atrial fibrillation: Secondary | ICD-10-CM | POA: Diagnosis not present

## 2021-03-09 DIAGNOSIS — M48062 Spinal stenosis, lumbar region with neurogenic claudication: Secondary | ICD-10-CM | POA: Diagnosis not present

## 2021-03-09 NOTE — Patient Instructions (Signed)
Go to the [facility] on [facility] for left arm and hip Xray

## 2021-03-11 ENCOUNTER — Ambulatory Visit: Payer: Medicare Other | Admitting: Family Medicine

## 2021-03-11 DIAGNOSIS — G894 Chronic pain syndrome: Secondary | ICD-10-CM | POA: Diagnosis not present

## 2021-03-11 DIAGNOSIS — M48062 Spinal stenosis, lumbar region with neurogenic claudication: Secondary | ICD-10-CM | POA: Diagnosis not present

## 2021-03-11 DIAGNOSIS — I10 Essential (primary) hypertension: Secondary | ICD-10-CM | POA: Diagnosis not present

## 2021-03-11 DIAGNOSIS — I671 Cerebral aneurysm, nonruptured: Secondary | ICD-10-CM | POA: Diagnosis not present

## 2021-03-11 DIAGNOSIS — I4891 Unspecified atrial fibrillation: Secondary | ICD-10-CM | POA: Diagnosis not present

## 2021-03-11 DIAGNOSIS — M8580 Other specified disorders of bone density and structure, unspecified site: Secondary | ICD-10-CM | POA: Diagnosis not present

## 2021-03-11 DIAGNOSIS — Z7901 Long term (current) use of anticoagulants: Secondary | ICD-10-CM | POA: Diagnosis not present

## 2021-03-11 DIAGNOSIS — M5116 Intervertebral disc disorders with radiculopathy, lumbar region: Secondary | ICD-10-CM | POA: Diagnosis not present

## 2021-03-11 DIAGNOSIS — Z8673 Personal history of transient ischemic attack (TIA), and cerebral infarction without residual deficits: Secondary | ICD-10-CM | POA: Diagnosis not present

## 2021-03-15 DIAGNOSIS — I4891 Unspecified atrial fibrillation: Secondary | ICD-10-CM | POA: Diagnosis not present

## 2021-03-15 DIAGNOSIS — M8580 Other specified disorders of bone density and structure, unspecified site: Secondary | ICD-10-CM | POA: Diagnosis not present

## 2021-03-15 DIAGNOSIS — M5116 Intervertebral disc disorders with radiculopathy, lumbar region: Secondary | ICD-10-CM | POA: Diagnosis not present

## 2021-03-15 DIAGNOSIS — I10 Essential (primary) hypertension: Secondary | ICD-10-CM | POA: Diagnosis not present

## 2021-03-15 DIAGNOSIS — G894 Chronic pain syndrome: Secondary | ICD-10-CM | POA: Diagnosis not present

## 2021-03-15 DIAGNOSIS — Z7901 Long term (current) use of anticoagulants: Secondary | ICD-10-CM | POA: Diagnosis not present

## 2021-03-15 DIAGNOSIS — M48062 Spinal stenosis, lumbar region with neurogenic claudication: Secondary | ICD-10-CM | POA: Diagnosis not present

## 2021-03-15 DIAGNOSIS — Z8673 Personal history of transient ischemic attack (TIA), and cerebral infarction without residual deficits: Secondary | ICD-10-CM | POA: Diagnosis not present

## 2021-03-15 DIAGNOSIS — I671 Cerebral aneurysm, nonruptured: Secondary | ICD-10-CM | POA: Diagnosis not present

## 2021-03-17 DIAGNOSIS — I4891 Unspecified atrial fibrillation: Secondary | ICD-10-CM | POA: Diagnosis not present

## 2021-03-17 DIAGNOSIS — Z8673 Personal history of transient ischemic attack (TIA), and cerebral infarction without residual deficits: Secondary | ICD-10-CM | POA: Diagnosis not present

## 2021-03-17 DIAGNOSIS — M5116 Intervertebral disc disorders with radiculopathy, lumbar region: Secondary | ICD-10-CM | POA: Diagnosis not present

## 2021-03-17 DIAGNOSIS — Z7901 Long term (current) use of anticoagulants: Secondary | ICD-10-CM | POA: Diagnosis not present

## 2021-03-17 DIAGNOSIS — I10 Essential (primary) hypertension: Secondary | ICD-10-CM | POA: Diagnosis not present

## 2021-03-17 DIAGNOSIS — M8580 Other specified disorders of bone density and structure, unspecified site: Secondary | ICD-10-CM | POA: Diagnosis not present

## 2021-03-17 DIAGNOSIS — M48062 Spinal stenosis, lumbar region with neurogenic claudication: Secondary | ICD-10-CM | POA: Diagnosis not present

## 2021-03-17 DIAGNOSIS — I671 Cerebral aneurysm, nonruptured: Secondary | ICD-10-CM | POA: Diagnosis not present

## 2021-03-17 DIAGNOSIS — G894 Chronic pain syndrome: Secondary | ICD-10-CM | POA: Diagnosis not present

## 2021-03-18 DIAGNOSIS — M25512 Pain in left shoulder: Secondary | ICD-10-CM | POA: Diagnosis not present

## 2021-03-18 DIAGNOSIS — M79622 Pain in left upper arm: Secondary | ICD-10-CM | POA: Diagnosis not present

## 2021-03-18 DIAGNOSIS — M5136 Other intervertebral disc degeneration, lumbar region: Secondary | ICD-10-CM | POA: Diagnosis not present

## 2021-03-18 DIAGNOSIS — G8929 Other chronic pain: Secondary | ICD-10-CM | POA: Diagnosis not present

## 2021-03-18 DIAGNOSIS — M48062 Spinal stenosis, lumbar region with neurogenic claudication: Secondary | ICD-10-CM | POA: Diagnosis not present

## 2021-03-18 DIAGNOSIS — M5416 Radiculopathy, lumbar region: Secondary | ICD-10-CM | POA: Diagnosis not present

## 2021-03-23 ENCOUNTER — Telehealth: Payer: Self-pay

## 2021-03-23 DIAGNOSIS — Z7901 Long term (current) use of anticoagulants: Secondary | ICD-10-CM | POA: Diagnosis not present

## 2021-03-23 DIAGNOSIS — M48062 Spinal stenosis, lumbar region with neurogenic claudication: Secondary | ICD-10-CM | POA: Diagnosis not present

## 2021-03-23 DIAGNOSIS — M8580 Other specified disorders of bone density and structure, unspecified site: Secondary | ICD-10-CM | POA: Diagnosis not present

## 2021-03-23 DIAGNOSIS — M79602 Pain in left arm: Secondary | ICD-10-CM

## 2021-03-23 DIAGNOSIS — I671 Cerebral aneurysm, nonruptured: Secondary | ICD-10-CM | POA: Diagnosis not present

## 2021-03-23 DIAGNOSIS — I4891 Unspecified atrial fibrillation: Secondary | ICD-10-CM | POA: Diagnosis not present

## 2021-03-23 DIAGNOSIS — M5116 Intervertebral disc disorders with radiculopathy, lumbar region: Secondary | ICD-10-CM | POA: Diagnosis not present

## 2021-03-23 DIAGNOSIS — G894 Chronic pain syndrome: Secondary | ICD-10-CM | POA: Diagnosis not present

## 2021-03-23 DIAGNOSIS — I10 Essential (primary) hypertension: Secondary | ICD-10-CM | POA: Diagnosis not present

## 2021-03-23 DIAGNOSIS — Z8673 Personal history of transient ischemic attack (TIA), and cerebral infarction without residual deficits: Secondary | ICD-10-CM | POA: Diagnosis not present

## 2021-03-23 NOTE — Telephone Encounter (Signed)
Referral for orthopedics placed. Please schedule.

## 2021-03-23 NOTE — Telephone Encounter (Addendum)
-----   Message from Wilder Glade, [place] sent at 03/23/2021  3:32 PM EDT -----  ----- Message ----- From: Addison Naegeli, RN Sent: 03/23/2021   3:20 PM EDT To: [place] [facility]  Pt given results per Dr Lelon Huh, "The joint space of her shoulder is a little wider than normal. It's not dislocated, but ligaments may be stretched out. There is nothing broken. If she is still having pain in her shoulder we can refer to orthopedics for further evaluation."; the pt verbalized but would like to discuss the issue she is having with hand; the pt says she can't pick things up, and she is still having pain; she also says her arm locks up when she moves it toward the back; she would the status of her orthopedic consult; the pt can be contacted at [phone]; will route to office for notification.       Birdie Sons, MD  03/22/2021 5:15 PM EDT      The joint space of her shoulder is a little wider than normal. It's not dislocated, but ligaments may be stretched out. There is nothing broken. If she is still having pain in her shoulder we can refer to orthopedics for further evaluation.    Doristine Devoid, [place]  03/17/2021 2:01 PM EDT      Patient advised. Patient is asking about her arm results? I see the results but need to be sign off.   Birdie Sons, MD  03/11/2021 3:49 PM EDT      Xrays show arthritic changes in hips, but no fractures. If she continues having pains then we can refer to physical therapy.

## 2021-03-25 ENCOUNTER — Telehealth: Payer: Self-pay | Admitting: Family Medicine

## 2021-03-25 DIAGNOSIS — M8580 Other specified disorders of bone density and structure, unspecified site: Secondary | ICD-10-CM | POA: Diagnosis not present

## 2021-03-25 DIAGNOSIS — M5116 Intervertebral disc disorders with radiculopathy, lumbar region: Secondary | ICD-10-CM | POA: Diagnosis not present

## 2021-03-25 DIAGNOSIS — I671 Cerebral aneurysm, nonruptured: Secondary | ICD-10-CM | POA: Diagnosis not present

## 2021-03-25 DIAGNOSIS — I10 Essential (primary) hypertension: Secondary | ICD-10-CM | POA: Diagnosis not present

## 2021-03-25 DIAGNOSIS — I4891 Unspecified atrial fibrillation: Secondary | ICD-10-CM | POA: Diagnosis not present

## 2021-03-25 DIAGNOSIS — Z7901 Long term (current) use of anticoagulants: Secondary | ICD-10-CM | POA: Diagnosis not present

## 2021-03-25 DIAGNOSIS — Z8673 Personal history of transient ischemic attack (TIA), and cerebral infarction without residual deficits: Secondary | ICD-10-CM | POA: Diagnosis not present

## 2021-03-25 DIAGNOSIS — M48062 Spinal stenosis, lumbar region with neurogenic claudication: Secondary | ICD-10-CM | POA: Diagnosis not present

## 2021-03-25 DIAGNOSIS — G894 Chronic pain syndrome: Secondary | ICD-10-CM | POA: Diagnosis not present

## 2021-03-25 NOTE — Telephone Encounter (Signed)
Called to give a clinical report on patient:  PAD - right foot was normal at 1.20 Lft. Foot moderate at 0.88  If there are any questions, please call at [phone]

## 2021-03-29 DIAGNOSIS — M48062 Spinal stenosis, lumbar region with neurogenic claudication: Secondary | ICD-10-CM | POA: Diagnosis not present

## 2021-03-29 DIAGNOSIS — Z8673 Personal history of transient ischemic attack (TIA), and cerebral infarction without residual deficits: Secondary | ICD-10-CM | POA: Diagnosis not present

## 2021-03-29 DIAGNOSIS — I4891 Unspecified atrial fibrillation: Secondary | ICD-10-CM | POA: Diagnosis not present

## 2021-03-29 DIAGNOSIS — I10 Essential (primary) hypertension: Secondary | ICD-10-CM | POA: Diagnosis not present

## 2021-03-29 DIAGNOSIS — M5116 Intervertebral disc disorders with radiculopathy, lumbar region: Secondary | ICD-10-CM | POA: Diagnosis not present

## 2021-03-29 DIAGNOSIS — Z7901 Long term (current) use of anticoagulants: Secondary | ICD-10-CM | POA: Diagnosis not present

## 2021-03-29 DIAGNOSIS — I671 Cerebral aneurysm, nonruptured: Secondary | ICD-10-CM | POA: Diagnosis not present

## 2021-03-29 DIAGNOSIS — G894 Chronic pain syndrome: Secondary | ICD-10-CM | POA: Diagnosis not present

## 2021-03-29 DIAGNOSIS — M8580 Other specified disorders of bone density and structure, unspecified site: Secondary | ICD-10-CM | POA: Diagnosis not present

## 2021-04-01 DIAGNOSIS — M48062 Spinal stenosis, lumbar region with neurogenic claudication: Secondary | ICD-10-CM | POA: Diagnosis not present

## 2021-04-01 DIAGNOSIS — Z8673 Personal history of transient ischemic attack (TIA), and cerebral infarction without residual deficits: Secondary | ICD-10-CM | POA: Diagnosis not present

## 2021-04-01 DIAGNOSIS — M8580 Other specified disorders of bone density and structure, unspecified site: Secondary | ICD-10-CM | POA: Diagnosis not present

## 2021-04-01 DIAGNOSIS — G894 Chronic pain syndrome: Secondary | ICD-10-CM | POA: Diagnosis not present

## 2021-04-01 DIAGNOSIS — I4891 Unspecified atrial fibrillation: Secondary | ICD-10-CM | POA: Diagnosis not present

## 2021-04-01 DIAGNOSIS — Z7901 Long term (current) use of anticoagulants: Secondary | ICD-10-CM | POA: Diagnosis not present

## 2021-04-01 DIAGNOSIS — I10 Essential (primary) hypertension: Secondary | ICD-10-CM | POA: Diagnosis not present

## 2021-04-01 DIAGNOSIS — I671 Cerebral aneurysm, nonruptured: Secondary | ICD-10-CM | POA: Diagnosis not present

## 2021-04-01 DIAGNOSIS — M5116 Intervertebral disc disorders with radiculopathy, lumbar region: Secondary | ICD-10-CM | POA: Diagnosis not present

## 2021-04-04 DIAGNOSIS — M25512 Pain in left shoulder: Secondary | ICD-10-CM | POA: Diagnosis not present

## 2021-04-04 DIAGNOSIS — W010XXA Fall on same level from slipping, tripping and stumbling without subsequent striking against object, initial encounter: Secondary | ICD-10-CM | POA: Diagnosis not present

## 2021-04-04 DIAGNOSIS — M19012 Primary osteoarthritis, left shoulder: Secondary | ICD-10-CM | POA: Diagnosis not present

## 2021-04-05 DIAGNOSIS — Z7901 Long term (current) use of anticoagulants: Secondary | ICD-10-CM | POA: Diagnosis not present

## 2021-04-05 DIAGNOSIS — I671 Cerebral aneurysm, nonruptured: Secondary | ICD-10-CM | POA: Diagnosis not present

## 2021-04-05 DIAGNOSIS — I10 Essential (primary) hypertension: Secondary | ICD-10-CM | POA: Diagnosis not present

## 2021-04-05 DIAGNOSIS — M8580 Other specified disorders of bone density and structure, unspecified site: Secondary | ICD-10-CM | POA: Diagnosis not present

## 2021-04-05 DIAGNOSIS — M5116 Intervertebral disc disorders with radiculopathy, lumbar region: Secondary | ICD-10-CM | POA: Diagnosis not present

## 2021-04-05 DIAGNOSIS — M48062 Spinal stenosis, lumbar region with neurogenic claudication: Secondary | ICD-10-CM | POA: Diagnosis not present

## 2021-04-05 DIAGNOSIS — I4891 Unspecified atrial fibrillation: Secondary | ICD-10-CM | POA: Diagnosis not present

## 2021-04-05 DIAGNOSIS — Z8673 Personal history of transient ischemic attack (TIA), and cerebral infarction without residual deficits: Secondary | ICD-10-CM | POA: Diagnosis not present

## 2021-04-05 DIAGNOSIS — G894 Chronic pain syndrome: Secondary | ICD-10-CM | POA: Diagnosis not present

## 2021-04-06 DIAGNOSIS — I671 Cerebral aneurysm, nonruptured: Secondary | ICD-10-CM | POA: Diagnosis not present

## 2021-04-06 DIAGNOSIS — I10 Essential (primary) hypertension: Secondary | ICD-10-CM | POA: Diagnosis not present

## 2021-04-06 DIAGNOSIS — M5116 Intervertebral disc disorders with radiculopathy, lumbar region: Secondary | ICD-10-CM | POA: Diagnosis not present

## 2021-04-06 DIAGNOSIS — I4891 Unspecified atrial fibrillation: Secondary | ICD-10-CM | POA: Diagnosis not present

## 2021-04-06 DIAGNOSIS — G894 Chronic pain syndrome: Secondary | ICD-10-CM | POA: Diagnosis not present

## 2021-04-06 DIAGNOSIS — M48062 Spinal stenosis, lumbar region with neurogenic claudication: Secondary | ICD-10-CM | POA: Diagnosis not present

## 2021-04-06 DIAGNOSIS — Z7901 Long term (current) use of anticoagulants: Secondary | ICD-10-CM | POA: Diagnosis not present

## 2021-04-06 DIAGNOSIS — M8580 Other specified disorders of bone density and structure, unspecified site: Secondary | ICD-10-CM | POA: Diagnosis not present

## 2021-04-06 DIAGNOSIS — Z8673 Personal history of transient ischemic attack (TIA), and cerebral infarction without residual deficits: Secondary | ICD-10-CM | POA: Diagnosis not present

## 2021-04-07 DIAGNOSIS — I671 Cerebral aneurysm, nonruptured: Secondary | ICD-10-CM | POA: Diagnosis not present

## 2021-04-07 DIAGNOSIS — G894 Chronic pain syndrome: Secondary | ICD-10-CM | POA: Diagnosis not present

## 2021-04-07 DIAGNOSIS — I10 Essential (primary) hypertension: Secondary | ICD-10-CM | POA: Diagnosis not present

## 2021-04-07 DIAGNOSIS — M48062 Spinal stenosis, lumbar region with neurogenic claudication: Secondary | ICD-10-CM | POA: Diagnosis not present

## 2021-04-07 DIAGNOSIS — Z7901 Long term (current) use of anticoagulants: Secondary | ICD-10-CM | POA: Diagnosis not present

## 2021-04-07 DIAGNOSIS — Z8673 Personal history of transient ischemic attack (TIA), and cerebral infarction without residual deficits: Secondary | ICD-10-CM | POA: Diagnosis not present

## 2021-04-07 DIAGNOSIS — I4891 Unspecified atrial fibrillation: Secondary | ICD-10-CM | POA: Diagnosis not present

## 2021-04-07 DIAGNOSIS — M5116 Intervertebral disc disorders with radiculopathy, lumbar region: Secondary | ICD-10-CM | POA: Diagnosis not present

## 2021-04-07 DIAGNOSIS — M8580 Other specified disorders of bone density and structure, unspecified site: Secondary | ICD-10-CM | POA: Diagnosis not present

## 2021-04-11 ENCOUNTER — Encounter: Payer: Self-pay | Admitting: Family Medicine

## 2021-04-11 ENCOUNTER — Ambulatory Visit (INDEPENDENT_AMBULATORY_CARE_PROVIDER_SITE_OTHER): Payer: Medicare Other | Admitting: Family Medicine

## 2021-04-11 ENCOUNTER — Other Ambulatory Visit: Payer: Self-pay

## 2021-04-11 VITALS — BP 128/75 | HR 86 | Wt 160.0 lb

## 2021-04-11 DIAGNOSIS — M4807 Spinal stenosis, lumbosacral region: Secondary | ICD-10-CM

## 2021-04-11 DIAGNOSIS — M419 Scoliosis, unspecified: Secondary | ICD-10-CM | POA: Diagnosis not present

## 2021-04-11 DIAGNOSIS — G8929 Other chronic pain: Secondary | ICD-10-CM

## 2021-04-11 DIAGNOSIS — R6889 Other general symptoms and signs: Secondary | ICD-10-CM | POA: Diagnosis not present

## 2021-04-11 DIAGNOSIS — M549 Dorsalgia, unspecified: Secondary | ICD-10-CM

## 2021-04-11 DIAGNOSIS — M5136 Other intervertebral disc degeneration, lumbar region: Secondary | ICD-10-CM | POA: Diagnosis not present

## 2021-04-11 DIAGNOSIS — M79605 Pain in left leg: Secondary | ICD-10-CM | POA: Diagnosis not present

## 2021-04-11 DIAGNOSIS — M5416 Radiculopathy, lumbar region: Secondary | ICD-10-CM

## 2021-04-11 MED ORDER — PREDNISONE 10 MG PO TABS: ORAL_TABLET | ORAL | 0 refills | Status: AC

## 2021-04-11 NOTE — Progress Notes (Signed)
Established patient visit   Patient: [name] [name]   DOB: 1941-03-06   80 y.o. Female  MRN: [id number] Visit Date: 04/11/2021  Today's healthcare provider: Lelon Huh, MD   No chief complaint on file.  Subjective    Leg Pain  The incident occurred at home. The injury mechanism was a fall (Pt states she has had chronic leg pain but worsening since her fall a few weeks ago.). The pain is present in the left leg (Left hip, upper leg, left knee). The quality of the pain is described as burning and aching. The pain has been Worsening since onset. Associated symptoms include muscle weakness. Pertinent negatives include no loss of motion, loss of sensation, numbness or tingling. The symptoms are aggravated by weight bearing (Standing). She has tried heat and elevation for the symptoms. The treatment provided mild relief.   Fall was over a month ago, but pain in left upper leg has been much worse the last week. Sometimes has shooting pain going into right arm. Has been working with physical therapist for last few months, but pain seems to be getting rose. Pain doesn't keep her up at night. Has been followed at [facility] orthopedics and Dr. Sharlet Salina and had extensive xrays of left shoulder, humerus and elbow showing possible partial shoulder separation. Hip xray showing Moderate symmetric narrowing of each hip joint, stable. Spurring in the pubic symphysis region noted. Degenerative change with scoliosis lower lumbar spine, stable moderate symmetric narrowing of each hip joint, stable. Spurring in the pubic symphysis region noted. Degenerative change with scoliosis lower lumbar spine, stable. Had xrays of lumbar and cervical spine by her orthopedist. Has been prescribed tramadol and hydrocodone which didn't provide any significant relief.   She also has pain down into her left lower leg worse with ambulation. She had ABIs done by home health nurse showing mild/moderate disease in left leg, normal  on the right.     Medications: Outpatient Medications Prior to Visit  Medication Sig   acetaminophen (TYLENOL) 650 MG CR tablet Take 650 mg by mouth every 8 (eight) hours as needed for pain.   alendronate (FOSAMAX) 70 MG tablet TAKE 1 TABLET BY MOUTH WEEKLY IN THE MORNING WITH A FULL GLASS OF WATER. DO NOT EAT, DRINK OR LIE DOWN FOR 30 MIN.   amoxicillin (AMOXIL) 500 MG capsule Take 4 tablets one hour before procedure (Patient not taking: No sig reported)   Artificial Tear Solution (SOOTHE XP XTRA PROTECTION) SOLN Apply 1 drop to eye as needed (both eyes).    B Complex-Biotin-FA (EQL B COMPLEX 100) TABS daily.    Biotin 10 MG CAPS Take by mouth daily.   bisacodyl (DULCOLAX) 5 MG EC tablet Take 5 mg by mouth daily as needed for moderate constipation.   Calcium-Magnesium-Vitamin D (CALCIUM 1200+D3 PO) Take 1,200 mg by mouth daily. Citracal ER   cholecalciferol (VITAMIN D) 1000 units tablet Take 1,000 Units by mouth daily.   Coenzyme Q10 (CO Q-10) 100 MG CAPS Take 100 mg by mouth daily.    diltiazem (CARDIZEM CD) 240 MG 24 hr capsule Take 1 capsule (240 mg total) by mouth daily.   gabapentin (NEURONTIN) 300 MG capsule Take 1 capsule (300 mg total) by mouth 2 (two) times daily.   HYDROcodone-acetaminophen (NORCO/VICODIN) 5-325 MG tablet Take 1 tablet by mouth 2 (two) times daily as needed. (Patient not taking: Reported on 03/09/2021)   Lactobacillus (ACIDOPHILUS PO) Take 1 tablet by mouth at bedtime.  lidocaine (LIDODERM) 5 % Place 1 patch onto the skin daily. Remove & Discard patch within 12 hours   MEGARED OMEGA-3 KRILL OIL PO Take 300 mg by mouth daily.    Menthol, Topical Analgesic, (ICY HOT BACK EX) Apply topically as needed.   metoprolol succinate (TOPROL-XL) 25 MG 24 hr tablet Take 0.5 tablets (12.5 mg total) by mouth daily.   oxybutynin (DITROPAN) 5 MG tablet Take 1 tablet (5 mg total) by mouth 2 (two) times daily.   rivaroxaban (XARELTO) 20 MG TABS tablet TAKE 1 TABLET BY MOUTH DAILY  WITH SUPPER   traMADol (ULTRAM) 50 MG tablet Take 1 tablet (50 mg total) by mouth every 6 (six) hours as needed.   TURMERIC PO Take 800 mg by mouth 2 (two) times daily.   Zinc 50 MG CAPS Take 25 mg by mouth daily.    No facility-administered medications prior to visit.    Review of Systems  Constitutional: Negative.   Musculoskeletal:  Positive for arthralgias, gait problem and myalgias. Negative for back pain, joint swelling, neck pain and neck stiffness.  Neurological:  Negative for tingling and numbness.      Objective    BP 128/75 (BP Location: Right Arm, Patient Position: Sitting, Cuff Size: Large)   Pulse 86   Wt 160 lb (72.6 kg)   SpO2 100%   BMI 33.44 kg/m    Physical Exam  General appearance: Mildly obese female, cooperative and in no acute distress Head: Normocephalic, without obvious abnormality, atraumatic Respiratory: Respirations even and unlabored, normal respiratory rate Extremities: All extremities are intact.  Skin: Skin color, texture, turgor normal. No rashes seen  Psych: Appropriate mood and affect. Neurologic: Mental status: Alert, oriented to person, place, and time, thought content appropriate.     Assessment & Plan     1. Chronic midline back pain, unspecified back location Not a candidate for NSAIDS due to being on NOAC. Is significantly impaired in her ADLs. Is already undergoing PT but getting worse. Minimal if any relief from tramadol and hydrocodone/apap.  - predniSONE (DELTASONE) 10 MG tablet; 6 tablets for 1 day, then 5 for 1 day, then 4 for 1 day, then 3 for 1 day, then 2 for 1 day then 1 daily  Dispense: 30 tablet; Refill: 0 - Ambulatory referral to Pain Clinic  2. Lumbar radiculopathy  - predniSONE (DELTASONE) 10 MG tablet; 6 tablets for 1 day, then 5 for 1 day, then 4 for 1 day, then 3 for 1 day, then 2 for 1 day then 1 daily  Dispense: 30 tablet; Refill: 0 - Ambulatory referral to Pain Clinic  3. Lumbar degenerative disc disease -  predniSONE (DELTASONE) 10 MG tablet; 6 tablets for 1 day, then 5 for 1 day, then 4 for 1 day, then 3 for 1 day, then 2 for 1 day then 1 daily  Dispense: 30 tablet; Refill: 0 - Ambulatory referral to Pain Clinic  4. Spinal stenosis of lumbosacral region  - predniSONE (DELTASONE) 10 MG tablet; 6 tablets for 1 day, then 5 for 1 day, then 4 for 1 day, then 3 for 1 day, then 2 for 1 day then 1 daily  Dispense: 30 tablet; Refill: 0 - Ambulatory referral to Pain Clinic  5. Scoliosis, unspecified scoliosis type, unspecified spinal region  - predniSONE (DELTASONE) 10 MG tablet; 6 tablets for 1 day, then 5 for 1 day, then 4 for 1 day, then 3 for 1 day, then 2 for 1 day then 1 daily  Dispense: 30 tablet; Refill: 0 - Ambulatory referral to Pain Clinic  6. Abnormal ankle brachial index (ABI) Mild to moderate (0.30 to 0.89) on screening by home health nurse.   7. Left leg pain       The entirety of the information documented in the History of Present Illness, Review of Systems and Physical Exam were personally obtained by me. Portions of this information were initially documented by the CMA and reviewed by me for thoroughness and accuracy.     Lelon Huh, MD  [facility] [phone] (phone) [phone] (fax)  [place]

## 2021-04-12 DIAGNOSIS — M48062 Spinal stenosis, lumbar region with neurogenic claudication: Secondary | ICD-10-CM | POA: Diagnosis not present

## 2021-04-12 DIAGNOSIS — Z8673 Personal history of transient ischemic attack (TIA), and cerebral infarction without residual deficits: Secondary | ICD-10-CM | POA: Diagnosis not present

## 2021-04-12 DIAGNOSIS — G894 Chronic pain syndrome: Secondary | ICD-10-CM | POA: Diagnosis not present

## 2021-04-12 DIAGNOSIS — I4891 Unspecified atrial fibrillation: Secondary | ICD-10-CM | POA: Diagnosis not present

## 2021-04-12 DIAGNOSIS — M5116 Intervertebral disc disorders with radiculopathy, lumbar region: Secondary | ICD-10-CM | POA: Diagnosis not present

## 2021-04-12 DIAGNOSIS — I10 Essential (primary) hypertension: Secondary | ICD-10-CM | POA: Diagnosis not present

## 2021-04-12 DIAGNOSIS — I671 Cerebral aneurysm, nonruptured: Secondary | ICD-10-CM | POA: Diagnosis not present

## 2021-04-12 DIAGNOSIS — Z7901 Long term (current) use of anticoagulants: Secondary | ICD-10-CM | POA: Diagnosis not present

## 2021-04-12 DIAGNOSIS — M8580 Other specified disorders of bone density and structure, unspecified site: Secondary | ICD-10-CM | POA: Diagnosis not present

## 2021-04-13 DIAGNOSIS — Z7901 Long term (current) use of anticoagulants: Secondary | ICD-10-CM | POA: Diagnosis not present

## 2021-04-13 DIAGNOSIS — M8580 Other specified disorders of bone density and structure, unspecified site: Secondary | ICD-10-CM | POA: Diagnosis not present

## 2021-04-13 DIAGNOSIS — I671 Cerebral aneurysm, nonruptured: Secondary | ICD-10-CM | POA: Diagnosis not present

## 2021-04-13 DIAGNOSIS — I10 Essential (primary) hypertension: Secondary | ICD-10-CM | POA: Diagnosis not present

## 2021-04-13 DIAGNOSIS — M48062 Spinal stenosis, lumbar region with neurogenic claudication: Secondary | ICD-10-CM | POA: Diagnosis not present

## 2021-04-13 DIAGNOSIS — I4891 Unspecified atrial fibrillation: Secondary | ICD-10-CM | POA: Diagnosis not present

## 2021-04-13 DIAGNOSIS — M5116 Intervertebral disc disorders with radiculopathy, lumbar region: Secondary | ICD-10-CM | POA: Diagnosis not present

## 2021-04-13 DIAGNOSIS — G894 Chronic pain syndrome: Secondary | ICD-10-CM | POA: Diagnosis not present

## 2021-04-13 DIAGNOSIS — Z8673 Personal history of transient ischemic attack (TIA), and cerebral infarction without residual deficits: Secondary | ICD-10-CM | POA: Diagnosis not present

## 2021-04-14 DIAGNOSIS — I4891 Unspecified atrial fibrillation: Secondary | ICD-10-CM | POA: Diagnosis not present

## 2021-04-14 DIAGNOSIS — M48062 Spinal stenosis, lumbar region with neurogenic claudication: Secondary | ICD-10-CM | POA: Diagnosis not present

## 2021-04-14 DIAGNOSIS — M5116 Intervertebral disc disorders with radiculopathy, lumbar region: Secondary | ICD-10-CM | POA: Diagnosis not present

## 2021-04-14 DIAGNOSIS — Z8673 Personal history of transient ischemic attack (TIA), and cerebral infarction without residual deficits: Secondary | ICD-10-CM | POA: Diagnosis not present

## 2021-04-14 DIAGNOSIS — I671 Cerebral aneurysm, nonruptured: Secondary | ICD-10-CM | POA: Diagnosis not present

## 2021-04-14 DIAGNOSIS — I10 Essential (primary) hypertension: Secondary | ICD-10-CM | POA: Diagnosis not present

## 2021-04-14 DIAGNOSIS — M8580 Other specified disorders of bone density and structure, unspecified site: Secondary | ICD-10-CM | POA: Diagnosis not present

## 2021-04-14 DIAGNOSIS — G894 Chronic pain syndrome: Secondary | ICD-10-CM | POA: Diagnosis not present

## 2021-04-14 DIAGNOSIS — Z7901 Long term (current) use of anticoagulants: Secondary | ICD-10-CM | POA: Diagnosis not present

## 2021-04-18 DIAGNOSIS — M8580 Other specified disorders of bone density and structure, unspecified site: Secondary | ICD-10-CM | POA: Diagnosis not present

## 2021-04-18 DIAGNOSIS — Z8673 Personal history of transient ischemic attack (TIA), and cerebral infarction without residual deficits: Secondary | ICD-10-CM | POA: Diagnosis not present

## 2021-04-18 DIAGNOSIS — M5116 Intervertebral disc disorders with radiculopathy, lumbar region: Secondary | ICD-10-CM | POA: Diagnosis not present

## 2021-04-18 DIAGNOSIS — M48062 Spinal stenosis, lumbar region with neurogenic claudication: Secondary | ICD-10-CM | POA: Diagnosis not present

## 2021-04-18 DIAGNOSIS — G894 Chronic pain syndrome: Secondary | ICD-10-CM | POA: Diagnosis not present

## 2021-04-18 DIAGNOSIS — Z7901 Long term (current) use of anticoagulants: Secondary | ICD-10-CM | POA: Diagnosis not present

## 2021-04-18 DIAGNOSIS — I671 Cerebral aneurysm, nonruptured: Secondary | ICD-10-CM | POA: Diagnosis not present

## 2021-04-18 DIAGNOSIS — I4891 Unspecified atrial fibrillation: Secondary | ICD-10-CM | POA: Diagnosis not present

## 2021-04-18 DIAGNOSIS — I10 Essential (primary) hypertension: Secondary | ICD-10-CM | POA: Diagnosis not present

## 2021-04-27 DIAGNOSIS — G894 Chronic pain syndrome: Secondary | ICD-10-CM | POA: Diagnosis not present

## 2021-04-27 DIAGNOSIS — M5116 Intervertebral disc disorders with radiculopathy, lumbar region: Secondary | ICD-10-CM | POA: Diagnosis not present

## 2021-04-27 DIAGNOSIS — M8580 Other specified disorders of bone density and structure, unspecified site: Secondary | ICD-10-CM | POA: Diagnosis not present

## 2021-04-27 DIAGNOSIS — Z7901 Long term (current) use of anticoagulants: Secondary | ICD-10-CM | POA: Diagnosis not present

## 2021-04-27 DIAGNOSIS — I10 Essential (primary) hypertension: Secondary | ICD-10-CM | POA: Diagnosis not present

## 2021-04-27 DIAGNOSIS — Z8673 Personal history of transient ischemic attack (TIA), and cerebral infarction without residual deficits: Secondary | ICD-10-CM | POA: Diagnosis not present

## 2021-04-27 DIAGNOSIS — M48062 Spinal stenosis, lumbar region with neurogenic claudication: Secondary | ICD-10-CM | POA: Diagnosis not present

## 2021-04-27 DIAGNOSIS — I671 Cerebral aneurysm, nonruptured: Secondary | ICD-10-CM | POA: Diagnosis not present

## 2021-04-27 DIAGNOSIS — I4891 Unspecified atrial fibrillation: Secondary | ICD-10-CM | POA: Diagnosis not present

## 2021-05-03 DIAGNOSIS — I10 Essential (primary) hypertension: Secondary | ICD-10-CM | POA: Diagnosis not present

## 2021-05-03 DIAGNOSIS — M8580 Other specified disorders of bone density and structure, unspecified site: Secondary | ICD-10-CM | POA: Diagnosis not present

## 2021-05-03 DIAGNOSIS — Z8673 Personal history of transient ischemic attack (TIA), and cerebral infarction without residual deficits: Secondary | ICD-10-CM | POA: Diagnosis not present

## 2021-05-03 DIAGNOSIS — G894 Chronic pain syndrome: Secondary | ICD-10-CM | POA: Diagnosis not present

## 2021-05-03 DIAGNOSIS — M5116 Intervertebral disc disorders with radiculopathy, lumbar region: Secondary | ICD-10-CM | POA: Diagnosis not present

## 2021-05-03 DIAGNOSIS — M48062 Spinal stenosis, lumbar region with neurogenic claudication: Secondary | ICD-10-CM | POA: Diagnosis not present

## 2021-05-03 DIAGNOSIS — I671 Cerebral aneurysm, nonruptured: Secondary | ICD-10-CM | POA: Diagnosis not present

## 2021-05-03 DIAGNOSIS — Z7901 Long term (current) use of anticoagulants: Secondary | ICD-10-CM | POA: Diagnosis not present

## 2021-05-03 DIAGNOSIS — M19012 Primary osteoarthritis, left shoulder: Secondary | ICD-10-CM | POA: Diagnosis not present

## 2021-05-03 DIAGNOSIS — I4891 Unspecified atrial fibrillation: Secondary | ICD-10-CM | POA: Diagnosis not present

## 2021-05-09 DIAGNOSIS — M48062 Spinal stenosis, lumbar region with neurogenic claudication: Secondary | ICD-10-CM | POA: Diagnosis not present

## 2021-05-09 DIAGNOSIS — Z8673 Personal history of transient ischemic attack (TIA), and cerebral infarction without residual deficits: Secondary | ICD-10-CM | POA: Diagnosis not present

## 2021-05-09 DIAGNOSIS — I671 Cerebral aneurysm, nonruptured: Secondary | ICD-10-CM | POA: Diagnosis not present

## 2021-05-09 DIAGNOSIS — G894 Chronic pain syndrome: Secondary | ICD-10-CM | POA: Diagnosis not present

## 2021-05-09 DIAGNOSIS — M19012 Primary osteoarthritis, left shoulder: Secondary | ICD-10-CM | POA: Diagnosis not present

## 2021-05-09 DIAGNOSIS — I4891 Unspecified atrial fibrillation: Secondary | ICD-10-CM | POA: Diagnosis not present

## 2021-05-09 DIAGNOSIS — Z7901 Long term (current) use of anticoagulants: Secondary | ICD-10-CM | POA: Diagnosis not present

## 2021-05-09 DIAGNOSIS — M8580 Other specified disorders of bone density and structure, unspecified site: Secondary | ICD-10-CM | POA: Diagnosis not present

## 2021-05-09 DIAGNOSIS — M5116 Intervertebral disc disorders with radiculopathy, lumbar region: Secondary | ICD-10-CM | POA: Diagnosis not present

## 2021-05-09 DIAGNOSIS — I10 Essential (primary) hypertension: Secondary | ICD-10-CM | POA: Diagnosis not present

## 2021-05-10 ENCOUNTER — Other Ambulatory Visit (INDEPENDENT_AMBULATORY_CARE_PROVIDER_SITE_OTHER): Payer: Self-pay | Admitting: Nurse Practitioner

## 2021-05-10 DIAGNOSIS — M79605 Pain in left leg: Secondary | ICD-10-CM

## 2021-05-10 DIAGNOSIS — R6889 Other general symptoms and signs: Secondary | ICD-10-CM

## 2021-05-11 DIAGNOSIS — I10 Essential (primary) hypertension: Secondary | ICD-10-CM | POA: Diagnosis not present

## 2021-05-11 DIAGNOSIS — M48062 Spinal stenosis, lumbar region with neurogenic claudication: Secondary | ICD-10-CM | POA: Diagnosis not present

## 2021-05-11 DIAGNOSIS — M5116 Intervertebral disc disorders with radiculopathy, lumbar region: Secondary | ICD-10-CM | POA: Diagnosis not present

## 2021-05-11 DIAGNOSIS — I4891 Unspecified atrial fibrillation: Secondary | ICD-10-CM | POA: Diagnosis not present

## 2021-05-11 DIAGNOSIS — Z8673 Personal history of transient ischemic attack (TIA), and cerebral infarction without residual deficits: Secondary | ICD-10-CM | POA: Diagnosis not present

## 2021-05-11 DIAGNOSIS — M8580 Other specified disorders of bone density and structure, unspecified site: Secondary | ICD-10-CM | POA: Diagnosis not present

## 2021-05-11 DIAGNOSIS — G894 Chronic pain syndrome: Secondary | ICD-10-CM | POA: Diagnosis not present

## 2021-05-11 DIAGNOSIS — Z7901 Long term (current) use of anticoagulants: Secondary | ICD-10-CM | POA: Diagnosis not present

## 2021-05-11 DIAGNOSIS — M19012 Primary osteoarthritis, left shoulder: Secondary | ICD-10-CM | POA: Diagnosis not present

## 2021-05-11 DIAGNOSIS — I671 Cerebral aneurysm, nonruptured: Secondary | ICD-10-CM | POA: Diagnosis not present

## 2021-05-13 ENCOUNTER — Ambulatory Visit (INDEPENDENT_AMBULATORY_CARE_PROVIDER_SITE_OTHER): Payer: Medicare Other

## 2021-05-13 ENCOUNTER — Ambulatory Visit (INDEPENDENT_AMBULATORY_CARE_PROVIDER_SITE_OTHER): Payer: Medicare Other | Admitting: Nurse Practitioner

## 2021-05-13 ENCOUNTER — Other Ambulatory Visit: Payer: Self-pay

## 2021-05-13 VITALS — BP 141/75 | HR 109 | Ht 60.0 in | Wt 165.0 lb

## 2021-05-13 DIAGNOSIS — M79605 Pain in left leg: Secondary | ICD-10-CM

## 2021-05-13 DIAGNOSIS — R6889 Other general symptoms and signs: Secondary | ICD-10-CM

## 2021-05-13 DIAGNOSIS — I10 Essential (primary) hypertension: Secondary | ICD-10-CM | POA: Diagnosis not present

## 2021-05-17 DIAGNOSIS — M5116 Intervertebral disc disorders with radiculopathy, lumbar region: Secondary | ICD-10-CM | POA: Diagnosis not present

## 2021-05-17 DIAGNOSIS — Z8673 Personal history of transient ischemic attack (TIA), and cerebral infarction without residual deficits: Secondary | ICD-10-CM | POA: Diagnosis not present

## 2021-05-17 DIAGNOSIS — M48062 Spinal stenosis, lumbar region with neurogenic claudication: Secondary | ICD-10-CM | POA: Diagnosis not present

## 2021-05-17 DIAGNOSIS — I671 Cerebral aneurysm, nonruptured: Secondary | ICD-10-CM | POA: Diagnosis not present

## 2021-05-17 DIAGNOSIS — M8580 Other specified disorders of bone density and structure, unspecified site: Secondary | ICD-10-CM | POA: Diagnosis not present

## 2021-05-17 DIAGNOSIS — I4891 Unspecified atrial fibrillation: Secondary | ICD-10-CM | POA: Diagnosis not present

## 2021-05-17 DIAGNOSIS — M19012 Primary osteoarthritis, left shoulder: Secondary | ICD-10-CM | POA: Diagnosis not present

## 2021-05-17 DIAGNOSIS — I10 Essential (primary) hypertension: Secondary | ICD-10-CM | POA: Diagnosis not present

## 2021-05-17 DIAGNOSIS — G894 Chronic pain syndrome: Secondary | ICD-10-CM | POA: Diagnosis not present

## 2021-05-17 DIAGNOSIS — Z7901 Long term (current) use of anticoagulants: Secondary | ICD-10-CM | POA: Diagnosis not present

## 2021-05-19 DIAGNOSIS — Z8673 Personal history of transient ischemic attack (TIA), and cerebral infarction without residual deficits: Secondary | ICD-10-CM | POA: Diagnosis not present

## 2021-05-19 DIAGNOSIS — G894 Chronic pain syndrome: Secondary | ICD-10-CM | POA: Diagnosis not present

## 2021-05-19 DIAGNOSIS — M8580 Other specified disorders of bone density and structure, unspecified site: Secondary | ICD-10-CM | POA: Diagnosis not present

## 2021-05-19 DIAGNOSIS — M19012 Primary osteoarthritis, left shoulder: Secondary | ICD-10-CM | POA: Diagnosis not present

## 2021-05-19 DIAGNOSIS — M5116 Intervertebral disc disorders with radiculopathy, lumbar region: Secondary | ICD-10-CM | POA: Diagnosis not present

## 2021-05-19 DIAGNOSIS — M48062 Spinal stenosis, lumbar region with neurogenic claudication: Secondary | ICD-10-CM | POA: Diagnosis not present

## 2021-05-19 DIAGNOSIS — I671 Cerebral aneurysm, nonruptured: Secondary | ICD-10-CM | POA: Diagnosis not present

## 2021-05-19 DIAGNOSIS — Z7901 Long term (current) use of anticoagulants: Secondary | ICD-10-CM | POA: Diagnosis not present

## 2021-05-19 DIAGNOSIS — I10 Essential (primary) hypertension: Secondary | ICD-10-CM | POA: Diagnosis not present

## 2021-05-19 DIAGNOSIS — I4891 Unspecified atrial fibrillation: Secondary | ICD-10-CM | POA: Diagnosis not present

## 2021-05-22 ENCOUNTER — Encounter (INDEPENDENT_AMBULATORY_CARE_PROVIDER_SITE_OTHER): Payer: Self-pay | Admitting: Nurse Practitioner

## 2021-05-22 NOTE — Progress Notes (Signed)
Subjective:    Patient ID: [name] [name], female    DOB: [birth year], 80 y.o.   MRN: [id number] Chief Complaint  Patient presents with   New Patient (Initial Visit)    [name] [name] abnormal home health  Abi LLE pain    [name] [name] is a 80 year old female that presents today on referral from her primary care provider after having abnormal home health ABIs.  Showed mild to moderate disease in her left lower extremity.  He also has pain in her left lower extremity with ambulation.  However this comes after a fall several weeks ago.  She denies any rest pain like symptoms.  The patient does have a history of lower back pain.  Today ABIs show 1.24 on the right with a TBI 0.95.  The left has an ABI of 1.23 with an ABI of 1.04.  The patient has triphasic tibial artery waveforms bilaterally with good toe waveforms bilaterally.   Review of Systems  Cardiovascular:  Positive for leg swelling.  All other systems reviewed and are negative.     Objective:   Physical Exam Vitals reviewed.  HENT:     Head: Normocephalic.  Cardiovascular:     Rate and Rhythm: Normal rate.     Pulses: Normal pulses.  Pulmonary:     Effort: Pulmonary effort is normal.  Skin:    General: Skin is warm and dry.     Capillary Refill: Capillary refill takes less than 2 seconds.  Neurological:     Mental Status: She is alert and oriented to person, place, and time.  Psychiatric:        Mood and Affect: Mood normal.        Behavior: Behavior normal.        Thought Content: Thought content normal.        Judgment: Judgment normal.    BP (!) 141/75   Pulse (!) 109   Ht 5' (1.524 m)   Wt 165 lb (74.8 kg)   BMI 32.22 kg/m   Past Medical History:  Diagnosis Date   Arthritis    osteo; in B knees;    Bladder disorder    Chicken pox    Chronic a-fib (HCC)    DVT (deep venous thrombosis) (HCC)    H/O   Dyspnea    Dysrhythmia    a fib   Edema    Hay fever    Heart murmur    Hypertension     controlled   Irregular heart beat    Scoliosis    Seizure ([place])    2 years ago knocked unconscious; hospitalized and diagnosed with seizures; no spells in last year;    Sepsis ([place]) 03/11/2019   Stroke ([place])    1 month ago (possible stroke)   Unspecified atrial fibrillation (HCC)     Social History   Socioeconomic History   Marital status: Divorced    Spouse name: Not on file   Number of children: 2   Years of education: Not on file   Highest education level: Bachelor's degree (e.g., BA, AB, BS)  Occupational History   Occupation: Retired  Tobacco Use   Smoking status: Former    Types: Cigarettes    Quit date: 04/11/1972    Years since quitting: 49.1   Smokeless tobacco: Never  Vaping Use   Vaping Use: Never used  Substance and Sexual Activity   Alcohol use: No    Alcohol/week: 0.0 standard drinks   Drug use: No  Sexual activity: Not on file  Other Topics Concern   Not on file  Social History Narrative   Retired, divorced, gets regular exercise ([place]- 3x weekly; walks dog)   Social Determinants of Health   Financial Resource Strain: Low Risk    Difficulty of Paying Living Expenses: Not hard at all  Food Insecurity: No Food Insecurity   Worried About Charity fundraiser in the Last Year: Never true   Arboriculturist in the Last Year: Never true  Transportation Needs: No Transportation Needs   Lack of Transportation (Medical): No   Lack of Transportation (Non-Medical): No  Physical Activity: Inactive   Days of Exercise per Week: 0 days   Minutes of Exercise per Session: 0 min  Stress: No Stress Concern Present   Feeling of Stress : Not at all  Social Connections: Moderately Isolated   Frequency of Communication with Friends and Family: More than three times a week   Frequency of Social Gatherings with Friends and Family: Three times a week   Attends Religious Services: More than 4 times per year   Active Member of Clubs or Organizations: No   Attends Theatre manager Meetings: Never   Marital Status: Divorced  Human resources officer Violence: Not At Risk   Fear of Current or Ex-Partner: No   Emotionally Abused: No   Physically Abused: No   Sexually Abused: No    Past Surgical History:  Procedure Laterality Date   bilateral wrist surgery  2003   BREAST BIOPSY Left    stereo,benign   [place] AND ABLATION  2007   Done in [place], [place] for atrial flutter   CATARACT EXTRACTION W/PHACO Right 06/01/2015   Procedure: CATARACT EXTRACTION PHACO AND INTRAOCULAR LENS PLACEMENT ([place]);  Surgeon: Birder Robson, MD;  Location: [facility] ORS;  Service: Ophthalmology;  Laterality: Right;  [place]: 01:02.0 AP%: 23.1 CDE: 14.33 Fluid lot# [id number]    CATARACT EXTRACTION W/PHACO Left 06/22/2015   Procedure: CATARACT EXTRACTION PHACO AND INTRAOCULAR LENS PLACEMENT ([facility]);  Surgeon: Birder Robson, MD;  Location: [facility] ORS;  Service: Ophthalmology;  Laterality: Left;  [place]: 01:00.7 AP%: 23.1 CDE: 14.01 Lot # [id number]   COLONOSCOPY     DILATION AND CURETTAGE OF UTERUS     JOINT REPLACEMENT     KNEE ARTHROPLASTY Right 03/01/2016   Procedure: COMPUTER ASSISTED TOTAL KNEE ARTHROPLASTY;  Surgeon: Dereck Leep, MD;  Location: [facility] ORS;  Service: Orthopedics;  Laterality: Right;   KNEE ARTHROPLASTY Left 04/23/2017   Procedure: COMPUTER ASSISTED TOTAL KNEE ARTHROPLASTY;  Surgeon: Dereck Leep, MD;  Location: [facility] ORS;  Service: Orthopedics;  Laterality: Left;   LAMINECTOMY     TONSILLECTOMY     TOTAL KNEE ARTHROPLASTY Right     Family History  Problem Relation Age of Onset   Stroke Mother    Congestive Heart Failure Mother    Atrial fibrillation Mother    Melanoma Mother    Stroke Maternal Grandmother    Arthritis Paternal Grandmother        RA   Diabetes Paternal Grandfather     Allergies  Allergen Reactions   Aspirin Other (See Comments)    On xarelto    CBC Latest Ref Rng & Units 08/03/2020 12/03/2019  03/14/2019  WBC 4.0 - 10.5 K/uL 9.4 11.3(H) 7.5  Hemoglobin 12.0 - 15.0 g/dL 14.9 15.5(H) 13.1  Hematocrit 36.0 - 46.0 % 44.1 44.9 38.6  Platelets 150 - 400 K/uL 171  192 152      CMP     Component Value Date/Time   NA 139 08/03/2020 1050   NA 139 09/12/2018 1124   NA 139 08/25/2013 0457   K 4.0 08/03/2020 1050   K 3.8 08/25/2013 0457   CL 106 08/03/2020 1050   CL 108 (H) 08/25/2013 0457   CO2 23 08/03/2020 1050   CO2 28 08/25/2013 0457   GLUCOSE 196 (H) 08/03/2020 1050   GLUCOSE 83 08/25/2013 0457   BUN 21 08/03/2020 1050   BUN 20 09/12/2018 1124   BUN 15 08/25/2013 0457   CREATININE 0.86 08/03/2020 1050   CREATININE 0.66 08/25/2013 0457   CALCIUM 10.2 08/03/2020 1050   CALCIUM 9.1 08/25/2013 0457   PROT 6.9 03/11/2019 1944   PROT 6.1 09/12/2018 1124   PROT 7.4 08/24/2013 1727   ALBUMIN 4.3 03/11/2019 1944   ALBUMIN 4.3 09/12/2018 1124   ALBUMIN 3.8 08/24/2013 1727   AST 26 03/11/2019 1944   AST 23 08/24/2013 1727   ALT 16 03/11/2019 1944   ALT 26 08/24/2013 1727   ALKPHOS 45 03/11/2019 1944   ALKPHOS 89 08/24/2013 1727   BILITOT 1.2 03/11/2019 1944   BILITOT 0.8 09/12/2018 1124   BILITOT 0.6 08/24/2013 1727   GFRNONAA >60 08/03/2020 1050   GFRNONAA >60 08/25/2013 0457   GFRAA >60 12/03/2019 1529   GFRAA >60 08/25/2013 0457     VAS [place] ABI WITH/WO TBI  Result Date: 05/16/2021  LOWER EXTREMITY DOPPLER STUDY Patient Name:  [name] [name]  Date of Exam:   05/13/2021 Medical Rec #: [id number]            Accession #:    [id number] Date of Birth: 1941-09-26            Patient Gender: F Patient Age:   46Y Exam Location:  [facility] Procedure:      VAS [place] ABI WITH/WO TBI Referring Phys: [id number] [place] --------------------------------------------------------------------------------  Indications: Abnormal Home health "ABI".  Performing Technologist: Almira Coaster RVS  Examination Guidelines: A complete evaluation includes at minimum, Doppler  waveform signals and systolic blood pressure reading at the level of bilateral brachial, anterior tibial, and posterior tibial arteries, when vessel segments are accessible. Bilateral testing is considered an integral part of a complete examination. Photoelectric Plethysmograph (PPG) waveforms and toe systolic pressure readings are included as required and additional duplex testing as needed. Limited examinations for reoccurring indications may be performed as noted.  ABI Findings: +---------+------------------+-----+---------+--------+ Right    Rt Pressure (mmHg)IndexWaveform Comment  +---------+------------------+-----+---------+--------+ Brachial 154                                      +---------+------------------+-----+---------+--------+ ATA      194               1.24 triphasic         +---------+------------------+-----+---------+--------+ PTA      194               1.24 triphasic         +---------+------------------+-----+---------+--------+ Great Toe148               0.95 Normal            +---------+------------------+-----+---------+--------+ +---------+------------------+-----+---------+-------+ Left     Lt Pressure (mmHg)IndexWaveform Comment +---------+------------------+-----+---------+-------+ Brachial 156                                     +---------+------------------+-----+---------+-------+  ATA      192               1.23 triphasic        +---------+------------------+-----+---------+-------+ PTA      185               1.19 triphasic        +---------+------------------+-----+---------+-------+ Great Toe162               1.04 Normal           +---------+------------------+-----+---------+-------+ +-------+-----------+-----------+------------+------------+ ABI/TBIToday's ABIToday's TBIPrevious ABIPrevious TBI +-------+-----------+-----------+------------+------------+ Right  1.24       .95                                  +-------+-----------+-----------+------------+------------+ Left   1.23       1.04                                +-------+-----------+-----------+------------+------------+  Summary: Right: Resting right ankle-brachial index is within normal range. No evidence of significant right lower extremity arterial disease. The right toe-brachial index is normal. Left: Resting left ankle-brachial index is within normal range. No evidence of significant left lower extremity arterial disease. The left toe-brachial index is normal.  *See table(s) above for measurements and observations.  Electronically signed by Hortencia Pilar MD on 05/16/2021 at 4:36:45 PM.    Final        Assessment & Plan:   1. Abnormal ankle brachial index (ABI) Recommend:  I do not find evidence of life style limiting vascular disease. The patient specifically denies life style limitation.  Previous noninvasive studies including ABI's of the legs do not identify critical vascular problems.  The patient should continue walking and begin a more formal exercise program. The patient should continue his antiplatelet therapy and aggressive treatment of the lipid abnormalities.  The patient should begin wearing graduated compression socks 15-20 mmHg strength to control her mild edema.  Patient will follow-up with me on a PRN basis   2. Left leg pain The patient has a longstanding history of radiculopathy.  This is likely the cause of her pain as her ABIs today were normal.  3. HYPERTENSION, BENIGN Continue antihypertensive medications as already ordered, these medications have been reviewed and there are no changes at this time.    Current Outpatient Medications on File Prior to Visit  Medication Sig Dispense Refill   acetaminophen (TYLENOL) 650 MG CR tablet Take 650 mg by mouth every 8 (eight) hours as needed for pain.     alendronate (FOSAMAX) 70 MG tablet TAKE 1 TABLET BY MOUTH WEEKLY IN THE MORNING WITH A FULL GLASS OF  WATER. DO NOT EAT, DRINK OR LIE DOWN FOR 30 MIN. 12 tablet 3   amoxicillin (AMOXIL) 500 MG capsule Take 4 tablets one hour before procedure 8 capsule 0   Artificial Tear Solution (SOOTHE XP XTRA PROTECTION) SOLN Apply 1 drop to eye as needed (both eyes).      B Complex-Biotin-FA (EQL B COMPLEX 100) TABS daily.      Biotin 10 MG CAPS Take by mouth daily.     bisacodyl (DULCOLAX) 5 MG EC tablet Take 5 mg by mouth daily as needed for moderate constipation.     Calcium-Magnesium-Vitamin D (CALCIUM 1200+D3 PO) Take 1,200 mg by mouth daily. Citracal ER     cholecalciferol (VITAMIN D)  1000 units tablet Take 1,000 Units by mouth daily.     Coenzyme Q10 (CO Q-10) 100 MG CAPS Take 100 mg by mouth daily.      diltiazem (CARDIZEM CD) 240 MG 24 hr capsule Take 1 capsule (240 mg total) by mouth daily. 90 capsule 2   gabapentin (NEURONTIN) 300 MG capsule Take 1 capsule (300 mg total) by mouth 2 (two) times daily. 180 capsule 3   Lactobacillus (ACIDOPHILUS PO) Take 1 tablet by mouth at bedtime.      lidocaine (LIDODERM) 5 % Place 1 patch onto the skin daily. Remove & Discard patch within 12 hours 30 patch 5   lidocaine (LIDODERM) 5 % Place onto the skin.     MEGARED OMEGA-3 KRILL OIL PO Take 300 mg by mouth daily.      Menthol, Topical Analgesic, (ICY HOT BACK EX) Apply topically as needed.     metoprolol succinate (TOPROL-XL) 25 MG 24 hr tablet Take 0.5 tablets (12.5 mg total) by mouth daily. 45 tablet 2   oxybutynin (DITROPAN) 5 MG tablet Take 1 tablet (5 mg total) by mouth 2 (two) times daily. 180 tablet 1   rivaroxaban (XARELTO) 20 MG TABS tablet TAKE 1 TABLET BY MOUTH DAILY WITH SUPPER 90 tablet 3   traMADol (ULTRAM) 50 MG tablet Take 1 tablet (50 mg total) by mouth every 6 (six) hours as needed. 32 tablet 3   TURMERIC PO Take 800 mg by mouth 2 (two) times daily.     Zinc 50 MG CAPS Take 25 mg by mouth daily.      Zinc Acetate 50 MG CAPS Take by mouth.     No current facility-administered medications on  file prior to visit.    There are no Patient Instructions on file for this visit. No follow-ups on file.   Kris Hartmann, [name]

## 2021-05-24 DIAGNOSIS — I10 Essential (primary) hypertension: Secondary | ICD-10-CM | POA: Diagnosis not present

## 2021-05-24 DIAGNOSIS — M5116 Intervertebral disc disorders with radiculopathy, lumbar region: Secondary | ICD-10-CM | POA: Diagnosis not present

## 2021-05-24 DIAGNOSIS — G894 Chronic pain syndrome: Secondary | ICD-10-CM | POA: Diagnosis not present

## 2021-05-24 DIAGNOSIS — I4891 Unspecified atrial fibrillation: Secondary | ICD-10-CM | POA: Diagnosis not present

## 2021-05-24 DIAGNOSIS — M19012 Primary osteoarthritis, left shoulder: Secondary | ICD-10-CM | POA: Diagnosis not present

## 2021-05-24 DIAGNOSIS — Z7901 Long term (current) use of anticoagulants: Secondary | ICD-10-CM | POA: Diagnosis not present

## 2021-05-24 DIAGNOSIS — I671 Cerebral aneurysm, nonruptured: Secondary | ICD-10-CM | POA: Diagnosis not present

## 2021-05-24 DIAGNOSIS — M8580 Other specified disorders of bone density and structure, unspecified site: Secondary | ICD-10-CM | POA: Diagnosis not present

## 2021-05-24 DIAGNOSIS — M48062 Spinal stenosis, lumbar region with neurogenic claudication: Secondary | ICD-10-CM | POA: Diagnosis not present

## 2021-05-24 DIAGNOSIS — Z8673 Personal history of transient ischemic attack (TIA), and cerebral infarction without residual deficits: Secondary | ICD-10-CM | POA: Diagnosis not present

## 2021-06-01 DIAGNOSIS — Z8673 Personal history of transient ischemic attack (TIA), and cerebral infarction without residual deficits: Secondary | ICD-10-CM | POA: Diagnosis not present

## 2021-06-01 DIAGNOSIS — M48062 Spinal stenosis, lumbar region with neurogenic claudication: Secondary | ICD-10-CM | POA: Diagnosis not present

## 2021-06-01 DIAGNOSIS — M8580 Other specified disorders of bone density and structure, unspecified site: Secondary | ICD-10-CM | POA: Diagnosis not present

## 2021-06-01 DIAGNOSIS — G894 Chronic pain syndrome: Secondary | ICD-10-CM | POA: Diagnosis not present

## 2021-06-01 DIAGNOSIS — I10 Essential (primary) hypertension: Secondary | ICD-10-CM | POA: Diagnosis not present

## 2021-06-01 DIAGNOSIS — I671 Cerebral aneurysm, nonruptured: Secondary | ICD-10-CM | POA: Diagnosis not present

## 2021-06-01 DIAGNOSIS — Z7901 Long term (current) use of anticoagulants: Secondary | ICD-10-CM | POA: Diagnosis not present

## 2021-06-01 DIAGNOSIS — I4891 Unspecified atrial fibrillation: Secondary | ICD-10-CM | POA: Diagnosis not present

## 2021-06-01 DIAGNOSIS — M19012 Primary osteoarthritis, left shoulder: Secondary | ICD-10-CM | POA: Diagnosis not present

## 2021-06-01 DIAGNOSIS — M5116 Intervertebral disc disorders with radiculopathy, lumbar region: Secondary | ICD-10-CM | POA: Diagnosis not present

## 2021-06-07 ENCOUNTER — Telehealth: Payer: Self-pay

## 2021-06-07 DIAGNOSIS — M48062 Spinal stenosis, lumbar region with neurogenic claudication: Secondary | ICD-10-CM | POA: Diagnosis not present

## 2021-06-07 DIAGNOSIS — M19012 Primary osteoarthritis, left shoulder: Secondary | ICD-10-CM | POA: Diagnosis not present

## 2021-06-07 DIAGNOSIS — M8580 Other specified disorders of bone density and structure, unspecified site: Secondary | ICD-10-CM | POA: Diagnosis not present

## 2021-06-07 DIAGNOSIS — G894 Chronic pain syndrome: Secondary | ICD-10-CM | POA: Diagnosis not present

## 2021-06-07 DIAGNOSIS — Z7901 Long term (current) use of anticoagulants: Secondary | ICD-10-CM | POA: Diagnosis not present

## 2021-06-07 DIAGNOSIS — I10 Essential (primary) hypertension: Secondary | ICD-10-CM | POA: Diagnosis not present

## 2021-06-07 DIAGNOSIS — Z8673 Personal history of transient ischemic attack (TIA), and cerebral infarction without residual deficits: Secondary | ICD-10-CM | POA: Diagnosis not present

## 2021-06-07 DIAGNOSIS — I4891 Unspecified atrial fibrillation: Secondary | ICD-10-CM | POA: Diagnosis not present

## 2021-06-07 DIAGNOSIS — I671 Cerebral aneurysm, nonruptured: Secondary | ICD-10-CM | POA: Diagnosis not present

## 2021-06-07 DIAGNOSIS — M5116 Intervertebral disc disorders with radiculopathy, lumbar region: Secondary | ICD-10-CM | POA: Diagnosis not present

## 2021-06-07 NOTE — Telephone Encounter (Signed)
Copied from [name] [phone]. Topic: General - Other >> Jun 07, 2021  2:30 PM Bayard Beaver wrote: Reason for CRM: [name] [facility]  called, states patient is also doing speech therapy 1x3 along with home health already set up. [name] [name]

## 2021-06-07 NOTE — Telephone Encounter (Signed)
That's fine

## 2021-06-08 DIAGNOSIS — Z7901 Long term (current) use of anticoagulants: Secondary | ICD-10-CM | POA: Diagnosis not present

## 2021-06-08 DIAGNOSIS — M48062 Spinal stenosis, lumbar region with neurogenic claudication: Secondary | ICD-10-CM | POA: Diagnosis not present

## 2021-06-08 DIAGNOSIS — I671 Cerebral aneurysm, nonruptured: Secondary | ICD-10-CM | POA: Diagnosis not present

## 2021-06-08 DIAGNOSIS — M5116 Intervertebral disc disorders with radiculopathy, lumbar region: Secondary | ICD-10-CM | POA: Diagnosis not present

## 2021-06-08 DIAGNOSIS — M19012 Primary osteoarthritis, left shoulder: Secondary | ICD-10-CM | POA: Diagnosis not present

## 2021-06-08 DIAGNOSIS — I4891 Unspecified atrial fibrillation: Secondary | ICD-10-CM | POA: Diagnosis not present

## 2021-06-08 DIAGNOSIS — G894 Chronic pain syndrome: Secondary | ICD-10-CM | POA: Diagnosis not present

## 2021-06-08 DIAGNOSIS — I10 Essential (primary) hypertension: Secondary | ICD-10-CM | POA: Diagnosis not present

## 2021-06-08 DIAGNOSIS — M8580 Other specified disorders of bone density and structure, unspecified site: Secondary | ICD-10-CM | POA: Diagnosis not present

## 2021-06-08 DIAGNOSIS — Z8673 Personal history of transient ischemic attack (TIA), and cerebral infarction without residual deficits: Secondary | ICD-10-CM | POA: Diagnosis not present

## 2021-06-14 ENCOUNTER — Emergency Department: Payer: Medicare Other

## 2021-06-14 ENCOUNTER — Emergency Department
Admission: EM | Admit: 2021-06-14 | Discharge: 2021-06-14 | Disposition: A | Discharge status: Home / Self Care | Payer: Medicare Other | Attending: Student in an Organized Health Care Education/Training Program | Admitting: Student in an Organized Health Care Education/Training Program

## 2021-06-14 ENCOUNTER — Other Ambulatory Visit: Payer: Self-pay

## 2021-06-14 DIAGNOSIS — W06XXXA Fall from bed, initial encounter: Secondary | ICD-10-CM | POA: Insufficient documentation

## 2021-06-14 DIAGNOSIS — Z87891 Personal history of nicotine dependence: Secondary | ICD-10-CM | POA: Insufficient documentation

## 2021-06-14 DIAGNOSIS — Z8679 Personal history of other diseases of the circulatory system: Secondary | ICD-10-CM | POA: Insufficient documentation

## 2021-06-14 DIAGNOSIS — Z7901 Long term (current) use of anticoagulants: Secondary | ICD-10-CM | POA: Insufficient documentation

## 2021-06-14 DIAGNOSIS — Z96653 Presence of artificial knee joint, bilateral: Secondary | ICD-10-CM | POA: Diagnosis not present

## 2021-06-14 DIAGNOSIS — Z8669 Personal history of other diseases of the nervous system and sense organs: Secondary | ICD-10-CM | POA: Diagnosis not present

## 2021-06-14 DIAGNOSIS — I6782 Cerebral ischemia: Secondary | ICD-10-CM | POA: Diagnosis not present

## 2021-06-14 DIAGNOSIS — I499 Cardiac arrhythmia, unspecified: Secondary | ICD-10-CM | POA: Diagnosis not present

## 2021-06-14 DIAGNOSIS — M48062 Spinal stenosis, lumbar region with neurogenic claudication: Secondary | ICD-10-CM | POA: Diagnosis not present

## 2021-06-14 DIAGNOSIS — Z86718 Personal history of other venous thrombosis and embolism: Secondary | ICD-10-CM | POA: Insufficient documentation

## 2021-06-14 DIAGNOSIS — S7002XA Contusion of left hip, initial encounter: Secondary | ICD-10-CM | POA: Diagnosis not present

## 2021-06-14 DIAGNOSIS — Z79899 Other long term (current) drug therapy: Secondary | ICD-10-CM | POA: Diagnosis not present

## 2021-06-14 DIAGNOSIS — Z8673 Personal history of transient ischemic attack (TIA), and cerebral infarction without residual deficits: Secondary | ICD-10-CM | POA: Insufficient documentation

## 2021-06-14 DIAGNOSIS — T148XXA Other injury of unspecified body region, initial encounter: Secondary | ICD-10-CM

## 2021-06-14 DIAGNOSIS — R609 Edema, unspecified: Secondary | ICD-10-CM | POA: Diagnosis not present

## 2021-06-14 DIAGNOSIS — W19XXXA Unspecified fall, initial encounter: Secondary | ICD-10-CM | POA: Diagnosis not present

## 2021-06-14 DIAGNOSIS — Z743 Need for continuous supervision: Secondary | ICD-10-CM | POA: Diagnosis not present

## 2021-06-14 DIAGNOSIS — M5116 Intervertebral disc disorders with radiculopathy, lumbar region: Secondary | ICD-10-CM | POA: Diagnosis not present

## 2021-06-14 DIAGNOSIS — S79912A Unspecified injury of left hip, initial encounter: Secondary | ICD-10-CM | POA: Diagnosis present

## 2021-06-14 DIAGNOSIS — I10 Essential (primary) hypertension: Secondary | ICD-10-CM | POA: Insufficient documentation

## 2021-06-14 DIAGNOSIS — M19012 Primary osteoarthritis, left shoulder: Secondary | ICD-10-CM | POA: Diagnosis not present

## 2021-06-14 DIAGNOSIS — M25552 Pain in left hip: Secondary | ICD-10-CM | POA: Diagnosis not present

## 2021-06-14 DIAGNOSIS — M25562 Pain in left knee: Secondary | ICD-10-CM | POA: Diagnosis not present

## 2021-06-14 DIAGNOSIS — S0990XA Unspecified injury of head, initial encounter: Secondary | ICD-10-CM | POA: Insufficient documentation

## 2021-06-14 DIAGNOSIS — Z96652 Presence of left artificial knee joint: Secondary | ICD-10-CM | POA: Diagnosis not present

## 2021-06-14 DIAGNOSIS — M8580 Other specified disorders of bone density and structure, unspecified site: Secondary | ICD-10-CM | POA: Diagnosis not present

## 2021-06-14 DIAGNOSIS — R000 Tachycardia, unspecified: Secondary | ICD-10-CM | POA: Diagnosis not present

## 2021-06-14 DIAGNOSIS — R2242 Localized swelling, mass and lump, left lower limb: Secondary | ICD-10-CM | POA: Insufficient documentation

## 2021-06-14 DIAGNOSIS — I671 Cerebral aneurysm, nonruptured: Secondary | ICD-10-CM | POA: Diagnosis not present

## 2021-06-14 DIAGNOSIS — I4891 Unspecified atrial fibrillation: Secondary | ICD-10-CM | POA: Diagnosis not present

## 2021-06-14 DIAGNOSIS — G894 Chronic pain syndrome: Secondary | ICD-10-CM | POA: Diagnosis not present

## 2021-06-14 LAB — URINALYSIS, COMPLETE (UACMP) WITH MICROSCOPIC
Bacteria, UA: NONE SEEN
Bilirubin Urine: NEGATIVE
Glucose, UA: NEGATIVE mg/dL
Hgb urine dipstick: NEGATIVE
Ketones, ur: NEGATIVE mg/dL
Leukocytes,Ua: NEGATIVE
Nitrite: NEGATIVE
Protein, ur: NEGATIVE mg/dL
Specific Gravity, Urine: 1.013 (ref 1.005–1.030)
pH: 6 (ref 5.0–8.0)

## 2021-06-14 LAB — CBC
HCT: 35 % — ABNORMAL LOW (ref 36.0–46.0)
Hemoglobin: 12 g/dL (ref 12.0–15.0)
MCH: 34.3 pg — ABNORMAL HIGH (ref 26.0–34.0)
MCHC: 34.3 g/dL (ref 30.0–36.0)
MCV: 100 fL (ref 80.0–100.0)
Platelets: 156 10*3/uL (ref 150–400)
RBC: 3.5 MIL/uL — ABNORMAL LOW (ref 3.87–5.11)
RDW: 12.3 % (ref 11.5–15.5)
WBC: 8.4 10*3/uL (ref 4.0–10.5)
nRBC: 0 % (ref 0.0–0.2)

## 2021-06-14 LAB — COMPREHENSIVE METABOLIC PANEL
ALT: 14 U/L (ref 0–44)
AST: 18 U/L (ref 15–41)
Albumin: 3.8 g/dL (ref 3.5–5.0)
Alkaline Phosphatase: 32 U/L — ABNORMAL LOW (ref 38–126)
Anion gap: 12 (ref 5–15)
BUN: 24 mg/dL — ABNORMAL HIGH (ref 8–23)
CO2: 24 mmol/L (ref 22–32)
Calcium: 9.4 mg/dL (ref 8.9–10.3)
Chloride: 103 mmol/L (ref 98–111)
Creatinine, Ser: 0.87 mg/dL (ref 0.44–1.00)
GFR, Estimated: >60 mL/min (ref >60)
Glucose, Bld: 125 mg/dL — ABNORMAL HIGH (ref 70–99)
Potassium: 4.1 mmol/L (ref 3.5–5.1)
Sodium: 139 mmol/L (ref 135–145)
Total Bilirubin: 1 mg/dL (ref 0.3–1.2)
Total Protein: 5.8 g/dL — ABNORMAL LOW (ref 6.5–8.1)

## 2021-06-14 LAB — TROPONIN I (HIGH SENSITIVITY): Troponin I (High Sensitivity): 6 ng/L (ref <18)

## 2021-06-14 NOTE — ED Provider Notes (Signed)
[facility] Emergency Department Provider Note    Event Date/Time   First MD Initiated Contact with Patient 06/14/21 1121     (approximate)  I have reviewed the triage vital signs and the nursing notes.   HISTORY  Chief Complaint Fall (Pt had fall yesterday and slid out of bed today. Pt has swelling to L hip and knee. Denies LOC or hitting head, but does take xarelto. )    HPI [name] [name] is a 80 y.o. female below listed past medical history on Xarelto presents to the ER for evaluation of left hip and knee pain after 2 falls over the past 2 days.  States that this morning she slid out of bed.  Does not recall hitting her head.  Denies any headache.  Does have a large hematoma to the left lateral hip that she been having some difficulty with walking due to pain did ice it yesterday.  Denies any melena or hematochezia.  No chest pain or pressure.  Has h/o afib.  Past Medical History:  Diagnosis Date   Arthritis    osteo; in B knees;    Bladder disorder    Chicken pox    Chronic a-fib (HCC)    DVT (deep venous thrombosis) (HCC)    H/O   Dyspnea    Dysrhythmia    a fib   Edema    Hay fever    Heart murmur    Hypertension    controlled   Irregular heart beat    Scoliosis    Seizure ([place])    2 years ago knocked unconscious; hospitalized and diagnosed with seizures; no spells in last year;    Sepsis ([place]) 03/11/2019   Stroke ([place])    1 month ago (possible stroke)   Unspecified atrial fibrillation ([place])    Family History  Problem Relation Age of Onset   Stroke Mother    Congestive Heart Failure Mother    Atrial fibrillation Mother    Melanoma Mother    Stroke Maternal Grandmother    Arthritis Paternal Grandmother        RA   Diabetes Paternal Grandfather    Past Surgical History:  Procedure Laterality Date   bilateral wrist surgery  2003   BREAST BIOPSY Left    stereo,benign   BREAST SURGERY     CARDIAC ELECTROPHYSIOLOGY [place]  AND ABLATION  2007   Done in [place], [place] for atrial flutter   CATARACT EXTRACTION W/PHACO Right 06/01/2015   Procedure: CATARACT EXTRACTION PHACO AND INTRAOCULAR LENS PLACEMENT ([place]);  Surgeon: Birder Robson, MD;  Location: [facility] ORS;  Service: Ophthalmology;  Laterality: Right;  [place]: 01:02.0 AP%: 23.1 CDE: 14.33 Fluid lot# [id number]    CATARACT EXTRACTION W/PHACO Left 06/22/2015   Procedure: CATARACT EXTRACTION PHACO AND INTRAOCULAR LENS PLACEMENT (IOC);  Surgeon: Birder Robson, MD;  Location: [facility] ORS;  Service: Ophthalmology;  Laterality: Left;  [place]: 01:00.7 AP%: 23.1 CDE: 14.01 Lot # [id number]   COLONOSCOPY     DILATION AND CURETTAGE OF UTERUS     JOINT REPLACEMENT     KNEE ARTHROPLASTY Right 03/01/2016   Procedure: COMPUTER ASSISTED TOTAL KNEE ARTHROPLASTY;  Surgeon: Dereck Leep, MD;  Location: [facility] ORS;  Service: Orthopedics;  Laterality: Right;   KNEE ARTHROPLASTY Left 04/23/2017   Procedure: COMPUTER ASSISTED TOTAL KNEE ARTHROPLASTY;  Surgeon: Dereck Leep, MD;  Location: [facility] ORS;  Service: Orthopedics;  Laterality: Left;   LAMINECTOMY     TONSILLECTOMY  TOTAL KNEE ARTHROPLASTY Right    Patient Active Problem List   Diagnosis Date Noted   Spinal stenosis of lumbosacral region 09/11/2019   Expressive aphasia 03/11/2019   Irritable bladder 09/12/2018   Lumbar radiculopathy 05/13/2018   Chronic pain syndrome 05/13/2018   Lumbar degenerative disc disease 05/13/2018   Osteoporosis 08/02/2016   History of colon polyps 08/02/2016   Low back pain 06/20/2016   S/P total knee arthroplasty 03/01/2016   Knee osteoarthritis 02/16/2016   Weakness 09/27/2015   Balloon like swelling of an artery of the brain 02/12/2015   History of CVA (cerebrovascular accident) 02/16/2014   Seizures ([place]) 02/16/2014   Chronic leg pain 02/16/2014   Fatigue 07/02/2013   HYPERTENSION, BENIGN 12/07/2010   Atrial fibrillation, chronic 06/29/2009      Prior to Admission medications    Medication Sig Start Date End Date Taking? Authorizing Provider  acetaminophen (TYLENOL) 650 MG CR tablet Take 650 mg by mouth every 8 (eight) hours as needed for pain.    [provider]  alendronate (FOSAMAX) 70 MG tablet TAKE 1 TABLET BY MOUTH WEEKLY IN THE MORNING WITH A FULL GLASS OF WATER. DO NOT EAT, DRINK OR LIE DOWN FOR 30 MIN. 02/24/21   Birdie Sons, MD  amoxicillin (AMOXIL) 500 MG capsule Take 4 tablets one hour before procedure 08/15/19   Birdie Sons, MD  Artificial Tear Solution (SOOTHE XP XTRA PROTECTION) SOLN Apply 1 drop to eye as needed (both eyes).     [provider]  B Complex-Biotin-FA (EQL B COMPLEX 100) TABS daily.     [provider]  Biotin 10 MG CAPS Take by mouth daily.    [provider]  bisacodyl (DULCOLAX) 5 MG EC tablet Take 5 mg by mouth daily as needed for moderate constipation.    [provider]  Calcium-Magnesium-Vitamin D (CALCIUM 1200+D3 PO) Take 1,200 mg by mouth daily. Citracal ER    [provider]  cholecalciferol (VITAMIN D) 1000 units tablet Take 1,000 Units by mouth daily.    [provider]  Coenzyme Q10 (CO Q-10) 100 MG CAPS Take 100 mg by mouth daily.     [provider]  diltiazem (CARDIZEM CD) 240 MG 24 hr capsule Take 1 capsule (240 mg total) by mouth daily. 02/24/21   Birdie Sons, MD  gabapentin (NEURONTIN) 300 MG capsule Take 1 capsule (300 mg total) by mouth 2 (two) times daily. 02/24/21   Birdie Sons, MD  Lactobacillus (ACIDOPHILUS PO) Take 1 tablet by mouth at bedtime.     [provider]  lidocaine (LIDODERM) 5 % Place 1 patch onto the skin daily. Remove & Discard patch within 12 hours 05/28/20   Birdie Sons, MD  lidocaine (LIDODERM) 5 % Place onto the skin. 05/28/20   [provider]  MEGARED OMEGA-3 KRILL OIL PO Take 300 mg by mouth daily.     [provider]  Menthol, Topical Analgesic, (ICY HOT BACK EX) Apply topically as  needed.    [provider]  metoprolol succinate (TOPROL-XL) 25 MG 24 hr tablet Take 0.5 tablets (12.5 mg total) by mouth daily. 02/24/21   Birdie Sons, MD  oxybutynin (DITROPAN) 5 MG tablet Take 1 tablet (5 mg total) by mouth 2 (two) times daily. 02/24/21   Birdie Sons, MD  rivaroxaban (XARELTO) 20 MG TABS tablet TAKE 1 TABLET BY MOUTH DAILY WITH SUPPER 02/24/21   Birdie Sons, MD  traMADol (ULTRAM) 50 MG  tablet Take 1 tablet (50 mg total) by mouth every 6 (six) hours as needed. 11/18/19   Birdie Sons, MD  TURMERIC PO Take 800 mg by mouth 2 (two) times daily.    [provider]  Zinc 50 MG CAPS Take 25 mg by mouth daily.     [provider]  Zinc Acetate 50 MG CAPS Take by mouth.    [provider]    Allergies Aspirin    Social History Social History   Tobacco Use   Smoking status: Former    Types: Cigarettes    Quit date: 04/11/1972    Years since quitting: 49.2   Smokeless tobacco: Never  Vaping Use   Vaping Use: Never used  Substance Use Topics   Alcohol use: No    Alcohol/week: 0.0 standard drinks   Drug use: No    Review of Systems Patient denies headaches, rhinorrhea, blurry vision, numbness, shortness of breath, chest pain, edema, cough, abdominal pain, nausea, vomiting, diarrhea, dysuria, fevers, rashes or hallucinations unless otherwise stated above in HPI. ____________________________________________   PHYSICAL EXAM:  VITAL SIGNS: Vitals:   06/14/21 1131 06/14/21 1507  BP: 119/66 119/65  Pulse:  96  Resp:  (!) 23  Temp:  97.8 F (36.6 C)  SpO2:  100%    Constitutional: Alert and oriented.  Eyes: Conjunctivae are normal.  Head: Atraumatic. Nose: No congestion/rhinnorhea. Mouth/Throat: Mucous membranes are moist.   Neck: No stridor. Painless ROM.  Cardiovascular: Normal rate, regular rhythm. Grossly normal heart sounds.  Good peripheral circulation. Respiratory: Normal respiratory effort.  No  retractions. Lungs CTAB. Gastrointestinal: Soft and nontender. No distention. No abdominal bruits. No CVA tenderness. Genitourinary: deferred Musculoskeletal: Large contusion and hematoma of the left lateral hip no shortening or rotational deformity of the left lower extremity.  Neurovascular intact..  No joint effusions. Neurologic:  Normal speech and language. No gross focal neurologic deficits are appreciated. No facial droop Skin:  Skin is warm, dry and intact. No rash noted. Psychiatric: Mood and affect are normal. Speech and behavior are normal.  ____________________________________________   LABS (all labs ordered are listed, but only abnormal results are displayed)  Results for orders placed or performed during the hospital encounter of 06/14/21 (from the past 24 hour(s))  CBC     Status: Abnormal   Collection Time: 06/14/21 11:35 AM  Result Value Ref Range   WBC 8.4 4.0 - 10.5 K/uL   RBC 3.50 (L) 3.87 - 5.11 MIL/uL   Hemoglobin 12.0 12.0 - 15.0 g/dL   HCT 35.0 (L) 36.0 - 46.0 %   MCV 100.0 80.0 - 100.0 fL   MCH 34.3 (H) 26.0 - 34.0 pg   MCHC 34.3 30.0 - 36.0 g/dL   RDW 12.3 11.5 - 15.5 %   Platelets 156 150 - 400 K/uL   nRBC 0.0 0.0 - 0.2 %  Comprehensive metabolic panel     Status: Abnormal   Collection Time: 06/14/21 11:35 AM  Result Value Ref Range   Sodium 139 135 - 145 mmol/L   Potassium 4.1 3.5 - 5.1 mmol/L   Chloride 103 98 - 111 mmol/L   CO2 24 22 - 32 mmol/L   Glucose, Bld 125 (H) 70 - 99 mg/dL   BUN 24 (H) 8 - 23 mg/dL   Creatinine, Ser 0.87 0.44 - 1.00 mg/dL   Calcium 9.4 8.9 - 10.3 mg/dL   Total Protein 5.8 (L) 6.5 - 8.1 g/dL   Albumin 3.8 3.5 - 5.0 g/dL  AST 18 15 - 41 U/L   ALT 14 0 - 44 U/L   Alkaline Phosphatase 32 (L) 38 - 126 U/L   Total Bilirubin 1.0 0.3 - 1.2 mg/dL   GFR, Estimated >60 >60 mL/min   Anion gap 12 5 - 15  Troponin I (High Sensitivity)     Status: None   Collection Time: 06/14/21 11:35 AM  Result Value Ref Range   Troponin I  (High Sensitivity) 6 <18 ng/L  Urinalysis, Complete w Microscopic     Status: Abnormal   Collection Time: 06/14/21  2:16 PM  Result Value Ref Range   Color, Urine YELLOW (A) YELLOW   APPearance HAZY (A) CLEAR   Specific Gravity, Urine 1.013 1.005 - 1.030   pH 6.0 5.0 - 8.0   Glucose, UA NEGATIVE NEGATIVE mg/dL   Hgb urine dipstick NEGATIVE NEGATIVE   Bilirubin Urine NEGATIVE NEGATIVE   Ketones, ur NEGATIVE NEGATIVE mg/dL   Protein, ur NEGATIVE NEGATIVE mg/dL   Nitrite NEGATIVE NEGATIVE   Leukocytes,Ua NEGATIVE NEGATIVE   RBC / HPF 0-5 0 - 5 RBC/hpf   WBC, UA 0-5 0 - 5 WBC/hpf   Bacteria, UA NONE SEEN NONE SEEN   Squamous Epithelial / LPF 0-5 0 - 5   Mucus PRESENT    ____________________________________________  EKG My review and personal interpretation at Time: 11:58   Indication: fall  Rate: 100  Rhythm: afib Axis: normal Other: no stemi or depressions ____________________________________________  RADIOLOGY  I personally reviewed all radiographic images ordered to evaluate for the above acute complaints and reviewed radiology reports and findings.  These findings were personally discussed with the patient.  Please see medical record for radiology report.  ____________________________________________   PROCEDURES  Procedure(s) performed:  Procedures    Critical Care performed: no ____________________________________________   INITIAL IMPRESSION / ASSESSMENT AND PLAN / ED COURSE  Pertinent labs & imaging results that were available during my care of the patient were reviewed by me and considered in my medical decision making (see chart for details).   DDX: fracture, contusion, hematoma, electrolyte abn, uti, sdh, iph  [name] [name] is a 80 y.o. who presents to the ED with agitation as described above.  Patient nontoxic-appearing but does have large hematoma to the left side.  No focal neurodeficits.  Imaging ordered for above differential.  Will check blood  work.  Clinical Course as of 06/14/21 1509  Tue Jun 14, 2021  1435 Patient remains stable in no acute distress.  Still awaiting urinalysis remainder of imaging and work-up is reassuring. [PR]  1508 Urinalysis is normal.  Patient stable and appropriate for outpatient follow-up.  Family has requested referral for case management discussed getting additional home health possible PT assistance at her facility. [PR]    Clinical Course User Index [PR] Merlyn Lot, MD    The patient was evaluated in Emergency Department today for the symptoms described in the history of present illness. He/she was evaluated in the context of the global COVID-19 pandemic, which necessitated consideration that the patient might be at risk for infection with the SARS-CoV-2 virus that causes COVID-19. Institutional protocols and algorithms that pertain to the evaluation of patients at risk for COVID-19 are in a state of rapid change based on information released by regulatory bodies including the CDC and federal and state organizations. These policies and algorithms were followed during the patient's care in the ED.  As part of my medical decision making, I reviewed the following data within the  electronic MEDICAL RECORD NUMBER Nursing notes reviewed and incorporated, Labs reviewed, notes from prior ED visits and [place] Controlled Substance Database   ____________________________________________   FINAL CLINICAL IMPRESSION(S) / ED DIAGNOSES  Final diagnoses:  Hematoma      NEW MEDICATIONS STARTED DURING THIS VISIT:  New Prescriptions   No medications on file     Note:  This document was prepared using Dragon voice recognition software and may include unintentional dictation errors.    Merlyn Lot, MD 06/14/21 [phone]

## 2021-06-15 DIAGNOSIS — Z7901 Long term (current) use of anticoagulants: Secondary | ICD-10-CM | POA: Diagnosis not present

## 2021-06-15 DIAGNOSIS — I4891 Unspecified atrial fibrillation: Secondary | ICD-10-CM | POA: Diagnosis not present

## 2021-06-15 DIAGNOSIS — M19012 Primary osteoarthritis, left shoulder: Secondary | ICD-10-CM | POA: Diagnosis not present

## 2021-06-15 DIAGNOSIS — I10 Essential (primary) hypertension: Secondary | ICD-10-CM | POA: Diagnosis not present

## 2021-06-15 DIAGNOSIS — M48062 Spinal stenosis, lumbar region with neurogenic claudication: Secondary | ICD-10-CM | POA: Diagnosis not present

## 2021-06-15 DIAGNOSIS — Z8673 Personal history of transient ischemic attack (TIA), and cerebral infarction without residual deficits: Secondary | ICD-10-CM | POA: Diagnosis not present

## 2021-06-15 DIAGNOSIS — I671 Cerebral aneurysm, nonruptured: Secondary | ICD-10-CM | POA: Diagnosis not present

## 2021-06-15 DIAGNOSIS — M5116 Intervertebral disc disorders with radiculopathy, lumbar region: Secondary | ICD-10-CM | POA: Diagnosis not present

## 2021-06-15 DIAGNOSIS — G894 Chronic pain syndrome: Secondary | ICD-10-CM | POA: Diagnosis not present

## 2021-06-15 DIAGNOSIS — M8580 Other specified disorders of bone density and structure, unspecified site: Secondary | ICD-10-CM | POA: Diagnosis not present

## 2021-06-16 ENCOUNTER — Ambulatory Visit: Payer: Self-pay

## 2021-06-16 NOTE — Telephone Encounter (Signed)
Reason for Disposition  MILD weakness (i.e., does not interfere with ability to work, go to school, normal activities) (Exception: mild weakness is a chronic symptom)  Answer Assessment - Initial Assessment Questions 1. MECHANISM: "How did the fall happen?"     [place] getting out of bed 2. DOMESTIC VIOLENCE AND ELDER ABUSE SCREENING: "Did you fall because someone pushed you or tried to hurt you?" If Yes, ask: "Are you safe now?"     No 3. ONSET: "When did the fall happen?" (e.g., minutes, hours, or days ago)     06/14/21 4. LOCATION: "What part of the body hit the ground?" (e.g., back, buttocks, head, hips, knees, hands, head, stomach)     Left hip,knee, arm 5. INJURY: "Did you hurt (injure) yourself when you fell?" If Yes, ask: "What did you injure? Tell me more about this?" (e.g., body area; type of injury; pain severity)"     Yes 6. PAIN: "Is there any pain?" If Yes, ask: "How bad is the pain?" (e.g., Scale 1-10; or mild,  moderate, severe)   - NONE (0): No pain   - MILD (1-3): Doesn't interfere with normal activities    - MODERATE (4-7): Interferes with normal activities or awakens from sleep    - SEVERE (8-10): Excruciating pain, unable to do any normal activities      Moderate 7. SIZE: For cuts, bruises, or swelling, ask: "How large is it?" (e.g., inches or centimeters)      No 8. PREGNANCY: "Is there any chance you are pregnant?" "When was your last menstrual period?"     No 9. OTHER SYMPTOMS: "Do you have any other symptoms?" (e.g., dizziness, fever, weakness; new onset or worsening).      No 10. CAUSE: "What do you think caused the fall (or falling)?" (e.g., tripped, dizzy spell)       Slipped  Protocols used: [facility]

## 2021-06-16 NOTE — Telephone Encounter (Signed)
Pt. [name] [name] 06/14/21 getting out of bed. Was seen in ED. States "I didn't break anything but my left arm is still sore and I'd like to follow up with Dr. Caryn Section." No availability until end of September. Please advise.

## 2021-06-16 NOTE — Telephone Encounter (Signed)
Patient had an ED visit for a fall on 06/14/21. She had scheduled a hospital follow-up appointment for 06/20/21 but called to cancel as she is feeling much better and wanted to keep the other medical appointment with [facility] on the same day. Cancelled 06/20/21 appointment with Dr. Caryn Section as requested.

## 2021-06-20 ENCOUNTER — Ambulatory Visit: Payer: Medicare Other | Admitting: Family Medicine

## 2021-06-20 ENCOUNTER — Other Ambulatory Visit: Payer: Self-pay | Admitting: Family Medicine

## 2021-06-20 DIAGNOSIS — M5416 Radiculopathy, lumbar region: Secondary | ICD-10-CM | POA: Diagnosis not present

## 2021-06-20 DIAGNOSIS — I4891 Unspecified atrial fibrillation: Secondary | ICD-10-CM | POA: Diagnosis not present

## 2021-06-20 DIAGNOSIS — M48062 Spinal stenosis, lumbar region with neurogenic claudication: Secondary | ICD-10-CM | POA: Diagnosis not present

## 2021-06-20 DIAGNOSIS — G894 Chronic pain syndrome: Secondary | ICD-10-CM | POA: Diagnosis not present

## 2021-06-20 DIAGNOSIS — Z7901 Long term (current) use of anticoagulants: Secondary | ICD-10-CM | POA: Diagnosis not present

## 2021-06-20 DIAGNOSIS — M25512 Pain in left shoulder: Secondary | ICD-10-CM | POA: Diagnosis not present

## 2021-06-20 DIAGNOSIS — M8580 Other specified disorders of bone density and structure, unspecified site: Secondary | ICD-10-CM | POA: Diagnosis not present

## 2021-06-20 DIAGNOSIS — M5136 Other intervertebral disc degeneration, lumbar region: Secondary | ICD-10-CM | POA: Diagnosis not present

## 2021-06-20 DIAGNOSIS — G8929 Other chronic pain: Secondary | ICD-10-CM | POA: Diagnosis not present

## 2021-06-20 DIAGNOSIS — M19012 Primary osteoarthritis, left shoulder: Secondary | ICD-10-CM | POA: Diagnosis not present

## 2021-06-20 DIAGNOSIS — M5116 Intervertebral disc disorders with radiculopathy, lumbar region: Secondary | ICD-10-CM | POA: Diagnosis not present

## 2021-06-20 DIAGNOSIS — I10 Essential (primary) hypertension: Secondary | ICD-10-CM | POA: Diagnosis not present

## 2021-06-20 DIAGNOSIS — Z8673 Personal history of transient ischemic attack (TIA), and cerebral infarction without residual deficits: Secondary | ICD-10-CM | POA: Diagnosis not present

## 2021-06-20 DIAGNOSIS — I671 Cerebral aneurysm, nonruptured: Secondary | ICD-10-CM | POA: Diagnosis not present

## 2021-06-20 NOTE — Telephone Encounter (Signed)
Requested Prescriptions  Pending Prescriptions Disp Refills  . oxybutynin (DITROPAN) 5 MG tablet [Pharmacy Med Name: Oxybutynin Chloride 5 MG Oral Tablet] 180 tablet 0    Sig: TAKE 1 TABLET BY MOUTH  TWICE DAILY     Urology:  Bladder Agents Passed - 06/20/2021  8:44 PM      Passed - Valid encounter within last 12 months    Recent Outpatient Visits          2 months ago Chronic midline back pain, unspecified back location   [facility] Birdie Sons, MD   3 months ago Left hip pain   [facility] Birdie Sons, MD   3 months ago No-show for appointment   [facility] Birdie Sons, MD   4 months ago Bronchitis   [facility] Birdie Sons, MD   9 months ago Acute pain of both knees   [facility] Birdie Sons, MD

## 2021-06-21 DIAGNOSIS — Z8673 Personal history of transient ischemic attack (TIA), and cerebral infarction without residual deficits: Secondary | ICD-10-CM | POA: Diagnosis not present

## 2021-06-21 DIAGNOSIS — Z7901 Long term (current) use of anticoagulants: Secondary | ICD-10-CM | POA: Diagnosis not present

## 2021-06-21 DIAGNOSIS — I671 Cerebral aneurysm, nonruptured: Secondary | ICD-10-CM | POA: Diagnosis not present

## 2021-06-21 DIAGNOSIS — I4891 Unspecified atrial fibrillation: Secondary | ICD-10-CM | POA: Diagnosis not present

## 2021-06-21 DIAGNOSIS — I10 Essential (primary) hypertension: Secondary | ICD-10-CM | POA: Diagnosis not present

## 2021-06-21 DIAGNOSIS — M48062 Spinal stenosis, lumbar region with neurogenic claudication: Secondary | ICD-10-CM | POA: Diagnosis not present

## 2021-06-21 DIAGNOSIS — M19012 Primary osteoarthritis, left shoulder: Secondary | ICD-10-CM | POA: Diagnosis not present

## 2021-06-21 DIAGNOSIS — G894 Chronic pain syndrome: Secondary | ICD-10-CM | POA: Diagnosis not present

## 2021-06-21 DIAGNOSIS — M5116 Intervertebral disc disorders with radiculopathy, lumbar region: Secondary | ICD-10-CM | POA: Diagnosis not present

## 2021-06-21 DIAGNOSIS — M8580 Other specified disorders of bone density and structure, unspecified site: Secondary | ICD-10-CM | POA: Diagnosis not present

## 2021-06-22 ENCOUNTER — Telehealth: Payer: Self-pay

## 2021-06-22 NOTE — Telephone Encounter (Signed)
Copied from [place] [id number]. Topic: General - Other >> Jun 22, 2021  4:36 PM Bayard Beaver wrote: Reason for [id number] called  in about bill, says she spoke with billing and they suggested she can call to lower the tier on her medicine, so they bill wouldn't be so expensive, because she can't afford it. Please call back about this.

## 2021-06-28 DIAGNOSIS — M8580 Other specified disorders of bone density and structure, unspecified site: Secondary | ICD-10-CM | POA: Diagnosis not present

## 2021-06-28 DIAGNOSIS — M5116 Intervertebral disc disorders with radiculopathy, lumbar region: Secondary | ICD-10-CM | POA: Diagnosis not present

## 2021-06-28 DIAGNOSIS — I10 Essential (primary) hypertension: Secondary | ICD-10-CM | POA: Diagnosis not present

## 2021-06-28 DIAGNOSIS — I4891 Unspecified atrial fibrillation: Secondary | ICD-10-CM | POA: Diagnosis not present

## 2021-06-28 DIAGNOSIS — Z7901 Long term (current) use of anticoagulants: Secondary | ICD-10-CM | POA: Diagnosis not present

## 2021-06-28 DIAGNOSIS — Z8673 Personal history of transient ischemic attack (TIA), and cerebral infarction without residual deficits: Secondary | ICD-10-CM | POA: Diagnosis not present

## 2021-06-28 DIAGNOSIS — M19012 Primary osteoarthritis, left shoulder: Secondary | ICD-10-CM | POA: Diagnosis not present

## 2021-06-28 DIAGNOSIS — I671 Cerebral aneurysm, nonruptured: Secondary | ICD-10-CM | POA: Diagnosis not present

## 2021-06-28 DIAGNOSIS — G894 Chronic pain syndrome: Secondary | ICD-10-CM | POA: Diagnosis not present

## 2021-06-28 DIAGNOSIS — M48062 Spinal stenosis, lumbar region with neurogenic claudication: Secondary | ICD-10-CM | POA: Diagnosis not present

## 2021-07-01 ENCOUNTER — Inpatient Hospital Stay
Admission: EM | Admit: 2021-07-01 | Discharge: 2021-07-06 | DRG: 605 | Disposition: A | Discharge status: Skilled Nursing Facility | Payer: Medicare Other | Attending: Internal Medicine | Admitting: Internal Medicine

## 2021-07-01 ENCOUNTER — Emergency Department: Payer: Medicare Other

## 2021-07-01 ENCOUNTER — Other Ambulatory Visit: Payer: Self-pay

## 2021-07-01 ENCOUNTER — Encounter: Payer: Self-pay | Admitting: Emergency Medicine

## 2021-07-01 DIAGNOSIS — W19XXXA Unspecified fall, initial encounter: Secondary | ICD-10-CM | POA: Diagnosis not present

## 2021-07-01 DIAGNOSIS — S7002XA Contusion of left hip, initial encounter: Principal | ICD-10-CM | POA: Diagnosis present

## 2021-07-01 DIAGNOSIS — N3281 Overactive bladder: Secondary | ICD-10-CM | POA: Diagnosis present

## 2021-07-01 DIAGNOSIS — S300XXA Contusion of lower back and pelvis, initial encounter: Secondary | ICD-10-CM | POA: Diagnosis not present

## 2021-07-01 DIAGNOSIS — S2231XD Fracture of one rib, right side, subsequent encounter for fracture with routine healing: Secondary | ICD-10-CM

## 2021-07-01 DIAGNOSIS — S8992XA Unspecified injury of left lower leg, initial encounter: Secondary | ICD-10-CM | POA: Diagnosis not present

## 2021-07-01 DIAGNOSIS — M810 Age-related osteoporosis without current pathological fracture: Secondary | ICD-10-CM | POA: Diagnosis present

## 2021-07-01 DIAGNOSIS — Z87891 Personal history of nicotine dependence: Secondary | ICD-10-CM | POA: Diagnosis not present

## 2021-07-01 DIAGNOSIS — Z20822 Contact with and (suspected) exposure to covid-19: Secondary | ICD-10-CM | POA: Diagnosis present

## 2021-07-01 DIAGNOSIS — M25559 Pain in unspecified hip: Secondary | ICD-10-CM | POA: Diagnosis not present

## 2021-07-01 DIAGNOSIS — I482 Chronic atrial fibrillation, unspecified: Secondary | ICD-10-CM | POA: Diagnosis present

## 2021-07-01 DIAGNOSIS — D696 Thrombocytopenia, unspecified: Secondary | ICD-10-CM | POA: Diagnosis not present

## 2021-07-01 DIAGNOSIS — G894 Chronic pain syndrome: Secondary | ICD-10-CM | POA: Diagnosis present

## 2021-07-01 DIAGNOSIS — Z79891 Long term (current) use of opiate analgesic: Secondary | ICD-10-CM | POA: Diagnosis not present

## 2021-07-01 DIAGNOSIS — M5136 Other intervertebral disc degeneration, lumbar region: Secondary | ICD-10-CM | POA: Diagnosis not present

## 2021-07-01 DIAGNOSIS — Z823 Family history of stroke: Secondary | ICD-10-CM | POA: Diagnosis not present

## 2021-07-01 DIAGNOSIS — M25552 Pain in left hip: Secondary | ICD-10-CM

## 2021-07-01 DIAGNOSIS — I10 Essential (primary) hypertension: Secondary | ICD-10-CM

## 2021-07-01 DIAGNOSIS — R296 Repeated falls: Secondary | ICD-10-CM | POA: Diagnosis present

## 2021-07-01 DIAGNOSIS — Z886 Allergy status to analgesic agent status: Secondary | ICD-10-CM | POA: Diagnosis not present

## 2021-07-01 DIAGNOSIS — Z86718 Personal history of other venous thrombosis and embolism: Secondary | ICD-10-CM | POA: Diagnosis not present

## 2021-07-01 DIAGNOSIS — R0902 Hypoxemia: Secondary | ICD-10-CM | POA: Diagnosis not present

## 2021-07-01 DIAGNOSIS — I4891 Unspecified atrial fibrillation: Secondary | ICD-10-CM

## 2021-07-01 DIAGNOSIS — M7981 Nontraumatic hematoma of soft tissue: Secondary | ICD-10-CM | POA: Diagnosis not present

## 2021-07-01 DIAGNOSIS — M4807 Spinal stenosis, lumbosacral region: Secondary | ICD-10-CM

## 2021-07-01 DIAGNOSIS — Z743 Need for continuous supervision: Secondary | ICD-10-CM | POA: Diagnosis not present

## 2021-07-01 DIAGNOSIS — I69320 Aphasia following cerebral infarction: Secondary | ICD-10-CM

## 2021-07-01 DIAGNOSIS — I499 Cardiac arrhythmia, unspecified: Secondary | ICD-10-CM | POA: Diagnosis not present

## 2021-07-01 DIAGNOSIS — M5416 Radiculopathy, lumbar region: Secondary | ICD-10-CM

## 2021-07-01 DIAGNOSIS — Z7983 Long term (current) use of bisphosphonates: Secondary | ICD-10-CM

## 2021-07-01 DIAGNOSIS — W010XXA Fall on same level from slipping, tripping and stumbling without subsequent striking against object, initial encounter: Secondary | ICD-10-CM | POA: Diagnosis present

## 2021-07-01 DIAGNOSIS — R52 Pain, unspecified: Secondary | ICD-10-CM

## 2021-07-01 DIAGNOSIS — Z7901 Long term (current) use of anticoagulants: Secondary | ICD-10-CM

## 2021-07-01 DIAGNOSIS — M19012 Primary osteoarthritis, left shoulder: Secondary | ICD-10-CM | POA: Diagnosis present

## 2021-07-01 DIAGNOSIS — Z66 Do not resuscitate: Secondary | ICD-10-CM | POA: Diagnosis not present

## 2021-07-01 DIAGNOSIS — G40909 Epilepsy, unspecified, not intractable, without status epilepticus: Secondary | ICD-10-CM | POA: Diagnosis present

## 2021-07-01 DIAGNOSIS — T148XXA Other injury of unspecified body region, initial encounter: Secondary | ICD-10-CM | POA: Diagnosis not present

## 2021-07-01 DIAGNOSIS — Z96653 Presence of artificial knee joint, bilateral: Secondary | ICD-10-CM | POA: Diagnosis present

## 2021-07-01 DIAGNOSIS — R609 Edema, unspecified: Secondary | ICD-10-CM | POA: Diagnosis not present

## 2021-07-01 DIAGNOSIS — Z79899 Other long term (current) drug therapy: Secondary | ICD-10-CM | POA: Diagnosis not present

## 2021-07-01 DIAGNOSIS — D72829 Elevated white blood cell count, unspecified: Secondary | ICD-10-CM | POA: Diagnosis present

## 2021-07-01 DIAGNOSIS — M4180 Other forms of scoliosis, site unspecified: Secondary | ICD-10-CM

## 2021-07-01 DIAGNOSIS — S8012XA Contusion of left lower leg, initial encounter: Secondary | ICD-10-CM | POA: Diagnosis not present

## 2021-07-01 DIAGNOSIS — M51369 Other intervertebral disc degeneration, lumbar region without mention of lumbar back pain or lower extremity pain: Secondary | ICD-10-CM

## 2021-07-01 LAB — CBC WITH DIFFERENTIAL/PLATELET
Abs Immature Granulocytes: 0.06 10*3/uL (ref 0.00–0.07)
Basophils Absolute: 0.1 10*3/uL (ref 0.0–0.1)
Basophils Relative: 1 %
Eosinophils Absolute: 0.1 10*3/uL (ref 0.0–0.5)
Eosinophils Relative: 1 %
HCT: 31.7 % — ABNORMAL LOW (ref 36.0–46.0)
Hemoglobin: 10.8 g/dL — ABNORMAL LOW (ref 12.0–15.0)
Immature Granulocytes: 1 %
Lymphocytes Relative: 13 %
Lymphs Abs: 1.4 10*3/uL (ref 0.7–4.0)
MCH: 34.5 pg — ABNORMAL HIGH (ref 26.0–34.0)
MCHC: 34.1 g/dL (ref 30.0–36.0)
MCV: 101.3 fL — ABNORMAL HIGH (ref 80.0–100.0)
Monocytes Absolute: 0.9 10*3/uL (ref 0.1–1.0)
Monocytes Relative: 8 %
Neutro Abs: 8.6 10*3/uL — ABNORMAL HIGH (ref 1.7–7.7)
Neutrophils Relative %: 76 %
Platelets: 185 10*3/uL (ref 150–400)
RBC: 3.13 MIL/uL — ABNORMAL LOW (ref 3.87–5.11)
RDW: 14 % (ref 11.5–15.5)
WBC: 11.1 10*3/uL — ABNORMAL HIGH (ref 4.0–10.5)
nRBC: 0 % (ref 0.0–0.2)

## 2021-07-01 LAB — PROTIME-INR
INR: 1.7 — ABNORMAL HIGH (ref 0.8–1.2)
Prothrombin Time: 20.3 seconds — ABNORMAL HIGH (ref 11.4–15.2)

## 2021-07-01 LAB — BASIC METABOLIC PANEL
Anion gap: 8 (ref 5–15)
BUN: 20 mg/dL (ref 8–23)
CO2: 23 mmol/L (ref 22–32)
Calcium: 9.6 mg/dL (ref 8.9–10.3)
Chloride: 104 mmol/L (ref 98–111)
Creatinine, Ser: 0.69 mg/dL (ref 0.44–1.00)
GFR, Estimated: >60 mL/min (ref >60)
Glucose, Bld: 138 mg/dL — ABNORMAL HIGH (ref 70–99)
Potassium: 4.1 mmol/L (ref 3.5–5.1)
Sodium: 135 mmol/L (ref 135–145)

## 2021-07-01 LAB — HEPARIN LEVEL (UNFRACTIONATED): Heparin Unfractionated: >1.1 IU/mL — ABNORMAL HIGH (ref 0.30–0.70)

## 2021-07-01 LAB — APTT: aPTT: 29 seconds (ref 24–36)

## 2021-07-01 MED ORDER — IOHEXOL 350 MG/ML SOLN
100.0000 mL | Freq: Once | INTRAVENOUS | Status: AC | PRN
  Administered 2021-07-01: 100 mL via INTRAVENOUS

## 2021-07-01 MED ORDER — VITAMIN D 25 MCG (1000 UNIT) PO TABS
1000.0000 [IU] | ORAL_TABLET | Freq: Every day | ORAL | Status: DC
  Administered 2021-07-02 – 2021-07-06 (×5): 1000 [IU] via ORAL
  Filled 2021-07-01 (×5): qty 1

## 2021-07-01 MED ORDER — TRAMADOL HCL 50 MG PO TABS: 50.0000 mg | ORAL_TABLET | Freq: Four times a day (QID) | ORAL | Status: DC | PRN

## 2021-07-01 MED ORDER — LIDOCAINE 5 % EX PTCH
1.0000 | MEDICATED_PATCH | CUTANEOUS | Status: DC
  Administered 2021-07-02 – 2021-07-03 (×2): 1 via TRANSDERMAL
  Filled 2021-07-01 (×5): qty 1

## 2021-07-01 MED ORDER — MORPHINE SULFATE (PF) 2 MG/ML IV SOLN
2.0000 mg | Freq: Once | INTRAVENOUS | Status: AC
  Administered 2021-07-01: 2 mg via INTRAVENOUS

## 2021-07-01 MED ORDER — OMEGA-3-ACID ETHYL ESTERS 1 G PO CAPS
1.0000 g | ORAL_CAPSULE | Freq: Every day | ORAL | Status: DC
  Administered 2021-07-02 – 2021-07-06 (×5): 1 g via ORAL
  Filled 2021-07-01 (×5): qty 1

## 2021-07-01 MED ORDER — CO Q-10 100 MG PO CAPS: 100.0000 mg | ORAL_CAPSULE | Freq: Every day | ORAL | Status: DC

## 2021-07-01 MED ORDER — TRAZODONE HCL 50 MG PO TABS: 25.0000 mg | ORAL_TABLET | Freq: Every evening | ORAL | Status: DC | PRN

## 2021-07-01 MED ORDER — ONDANSETRON HCL 4 MG PO TABS: 4.0000 mg | ORAL_TABLET | Freq: Four times a day (QID) | ORAL | Status: DC | PRN

## 2021-07-01 MED ORDER — MORPHINE SULFATE (PF) 2 MG/ML IV SOLN
2.0000 mg | Freq: Once | INTRAVENOUS | Status: AC
  Administered 2021-07-01: 2 mg via INTRAVENOUS
  Filled 2021-07-01: qty 1

## 2021-07-01 MED ORDER — MORPHINE SULFATE (PF) 2 MG/ML IV SOLN
INTRAVENOUS | Status: AC
  Filled 2021-07-01: qty 1

## 2021-07-01 MED ORDER — ACETAMINOPHEN 325 MG PO TABS
650.0000 mg | ORAL_TABLET | Freq: Once | ORAL | Status: AC
  Administered 2021-07-01: 650 mg via ORAL
  Filled 2021-07-01: qty 2

## 2021-07-01 MED ORDER — METOPROLOL SUCCINATE ER 25 MG PO TB24
12.5000 mg | ORAL_TABLET | Freq: Every day | ORAL | Status: DC
  Administered 2021-07-03 – 2021-07-06 (×4): 12.5 mg via ORAL
  Filled 2021-07-01 (×5): qty 1

## 2021-07-01 MED ORDER — ACETAMINOPHEN 325 MG PO TABS
650.0000 mg | ORAL_TABLET | Freq: Four times a day (QID) | ORAL | Status: DC | PRN
  Administered 2021-07-03 (×2): 650 mg via ORAL
  Filled 2021-07-01 (×2): qty 2

## 2021-07-01 MED ORDER — MAGNESIUM HYDROXIDE 400 MG/5ML PO SUSP: 30.0000 mL | Freq: Every day | ORAL | Status: DC | PRN

## 2021-07-01 MED ORDER — DILTIAZEM HCL ER COATED BEADS 240 MG PO CP24
240.0000 mg | ORAL_CAPSULE | Freq: Every day | ORAL | Status: DC
  Administered 2021-07-03 – 2021-07-06 (×4): 240 mg via ORAL
  Filled 2021-07-01 (×5): qty 1

## 2021-07-01 MED ORDER — SODIUM CHLORIDE 0.9 % IV SOLN: INTRAVENOUS | Status: DC

## 2021-07-01 MED ORDER — ZINC SULFATE 220 (50 ZN) MG PO CAPS
220.0000 mg | ORAL_CAPSULE | Freq: Every day | ORAL | Status: DC
  Administered 2021-07-02 – 2021-07-06 (×5): 220 mg via ORAL
  Filled 2021-07-01 (×5): qty 1

## 2021-07-01 MED ORDER — MORPHINE SULFATE (PF) 4 MG/ML IV SOLN
4.0000 mg | INTRAVENOUS | Status: DC | PRN
  Administered 2021-07-02 (×2): 4 mg via INTRAVENOUS
  Filled 2021-07-01 (×2): qty 1

## 2021-07-01 MED ORDER — POLYVINYL ALCOHOL 1.4 % OP SOLN
1.0000 [drp] | OPHTHALMIC | Status: DC | PRN
  Filled 2021-07-01: qty 15

## 2021-07-01 MED ORDER — ONDANSETRON HCL 4 MG/2ML IJ SOLN
4.0000 mg | Freq: Once | INTRAMUSCULAR | Status: AC
  Administered 2021-07-01: 4 mg via INTRAVENOUS
  Filled 2021-07-01: qty 2

## 2021-07-01 MED ORDER — OXYBUTYNIN CHLORIDE 5 MG PO TABS
5.0000 mg | ORAL_TABLET | Freq: Two times a day (BID) | ORAL | Status: DC
  Administered 2021-07-02 – 2021-07-06 (×9): 5 mg via ORAL
  Filled 2021-07-01 (×10): qty 1

## 2021-07-01 MED ORDER — ONDANSETRON HCL 4 MG/2ML IJ SOLN: 4.0000 mg | Freq: Four times a day (QID) | INTRAMUSCULAR | Status: DC | PRN

## 2021-07-01 MED ORDER — BISACODYL 5 MG PO TBEC
5.0000 mg | DELAYED_RELEASE_TABLET | Freq: Every day | ORAL | Status: DC | PRN
  Administered 2021-07-06: 5 mg via ORAL
  Filled 2021-07-01: qty 1

## 2021-07-01 MED ORDER — GABAPENTIN 300 MG PO CAPS
300.0000 mg | ORAL_CAPSULE | Freq: Two times a day (BID) | ORAL | Status: DC
  Administered 2021-07-02 – 2021-07-06 (×9): 300 mg via ORAL
  Filled 2021-07-01: qty 1
  Filled 2021-07-01: qty 3
  Filled 2021-07-01 (×7): qty 1

## 2021-07-01 MED ORDER — TURMERIC 500 MG PO CAPS: 800.0000 mg | ORAL_CAPSULE | Freq: Two times a day (BID) | ORAL | Status: DC

## 2021-07-01 MED ORDER — ACETAMINOPHEN 650 MG RE SUPP: 650.0000 mg | Freq: Four times a day (QID) | RECTAL | Status: DC | PRN

## 2021-07-01 MED ORDER — MORPHINE SULFATE (PF) 4 MG/ML IV SOLN
4.0000 mg | Freq: Once | INTRAVENOUS | Status: AC
  Administered 2021-07-01: 4 mg via INTRAVENOUS
  Filled 2021-07-01: qty 1

## 2021-07-01 NOTE — ED Triage Notes (Signed)
Pt presents to ER from [facility] facility via EMS with left hip pain. Pt reports she uses a walker to walk and tripped and fell. Reports had a previous a couple weeks ago and hit same place. Pt is alert and oriented. Denies nay other symptom at present.
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
## 2021-07-01 NOTE — Progress Notes (Signed)
PHARMACIST - PHYSICIAN ORDER COMMUNICATION  CONCERNING: P&T Medication Policy on Herbal Medications  DESCRIPTION:  This patient's order for:  Co Q-10 CAPS 100 mg & Turmeric CAPS 800 mg  has been noted.  This product(s) is classified as an "herbal" or natural product. Due to a lack of definitive safety studies or FDA approval, nonstandard manufacturing practices, plus the potential risk of unknown drug-drug interactions while on inpatient medications, the Pharmacy and Therapeutics Committee does not permit the use of "herbal" or natural products of this type within [facility].   ACTION TAKEN: The pharmacy department is unable to verify this order at this time. Please reevaluate patient's clinical condition at discharge and address if the herbal or natural product(s) should be resumed at that time.   Renda Rolls, PharmD, [facility] 07/01/2021 11:50 PM

## 2021-07-01 NOTE — ED Provider Notes (Signed)
[facility] Emergency Department Provider Note  ____________________________________________  Time seen: Approximately 11:09 PM  I have reviewed the triage vital signs and the nursing notes.   HISTORY  Chief Complaint Fall and Hip Pain    HPI [name] [name] is a 80 y.o. female with a history of stroke, atrial fibrillation on Xarelto nightly which she last took last night who comes ED complaining of left hip pain after a fall at home.  She had a mechanical fall tripping on a crack in the pavement in front of her house.  Denies any lower leg pain or numbness or weakness.  Left hip pain is severe, constant, worse with movement, nonradiating, no alleviating factors.  Denies head injury or other pain complaints.    Past Medical History:  Diagnosis Date   Arthritis    osteo; in B knees;    Bladder disorder    Chicken pox    Chronic a-fib ([place])    DVT (deep venous thrombosis) (HCC)    H/O   Dyspnea    Dysrhythmia    a fib   Edema    Hay fever    Heart murmur    Hypertension    controlled   Irregular heart beat    Scoliosis    Seizure ([place])    2 years ago knocked unconscious; hospitalized and diagnosed with seizures; no spells in last year;    Sepsis ([place]) 03/11/2019   Stroke ([place])    1 month ago (possible stroke)   Unspecified atrial fibrillation [facility])      Patient Active Problem List   Diagnosis Date Noted   Fall 07/01/2021   Spinal stenosis of lumbosacral region 09/11/2019   Expressive aphasia 03/11/2019   Irritable bladder 09/12/2018   Lumbar radiculopathy 05/13/2018   Chronic pain syndrome 05/13/2018   Lumbar degenerative disc disease 05/13/2018   Osteoporosis 08/02/2016   History of colon polyps 08/02/2016   Low back pain 06/20/2016   S/P total knee arthroplasty 03/01/2016   Knee osteoarthritis 02/16/2016   Weakness 09/27/2015   Balloon like swelling of an artery of the brain 02/12/2015   History of CVA (cerebrovascular  accident) 02/16/2014   Seizures ([place]) 02/16/2014   Chronic leg pain 02/16/2014   Fatigue 07/02/2013   HYPERTENSION, BENIGN 12/07/2010   Atrial fibrillation, chronic 06/29/2009     Past Surgical History:  Procedure Laterality Date   bilateral wrist surgery  2003   BREAST BIOPSY Left    stereo,benign   BREAST SURGERY     CARDIAC ELECTROPHYSIOLOGY [place] AND ABLATION  2007   Done in [place], [place] for atrial flutter   CATARACT EXTRACTION W/PHACO Right 06/01/2015   Procedure: CATARACT EXTRACTION PHACO AND INTRAOCULAR LENS PLACEMENT ([place]);  Surgeon: Birder Robson, MD;  Location: [facility] ORS;  Service: Ophthalmology;  Laterality: Right;  [place]: 01:02.0 AP%: 23.1 CDE: 14.33 Fluid lot# [id number]    CATARACT EXTRACTION W/PHACO Left 06/22/2015   Procedure: CATARACT EXTRACTION PHACO AND INTRAOCULAR LENS PLACEMENT (I[facility]);  Surgeon: Birder Robson, MD;  Location: [facility] ORS;  Service: Ophthalmology;  Laterality: Left;  [place]: 01:00.7 AP%: 23.1 CDE: 14.01 Lot # [id number]   COLONOSCOPY     DILATION AND CURETTAGE OF UTERUS     JOINT REPLACEMENT     KNEE ARTHROPLASTY Right 03/01/2016   Procedure: COMPUTER ASSISTED TOTAL KNEE ARTHROPLASTY;  Surgeon: Dereck Leep, MD;  Location: [facility] ORS;  Service: Orthopedics;  Laterality: Right;   KNEE ARTHROPLASTY Left 04/23/2017   Procedure: COMPUTER ASSISTED TOTAL KNEE  ARTHROPLASTY;  Surgeon: Dereck Leep, MD;  Location: [facility] ORS;  Service: Orthopedics;  Laterality: Left;   LAMINECTOMY     TONSILLECTOMY     TOTAL KNEE ARTHROPLASTY Right      Prior to Admission medications   Medication Sig Start Date End Date Taking? Authorizing Provider  acetaminophen (TYLENOL) 650 MG CR tablet Take 650 mg by mouth every 8 (eight) hours as needed for pain.    [provider]  alendronate (FOSAMAX) 70 MG tablet TAKE 1 TABLET BY MOUTH WEEKLY IN THE MORNING WITH A FULL GLASS OF WATER. DO NOT EAT, DRINK OR LIE DOWN FOR 30 MIN. 02/24/21   Birdie Sons, MD  amoxicillin  (AMOXIL) 500 MG capsule Take 4 tablets one hour before procedure 08/15/19   Birdie Sons, MD  Artificial Tear Solution (SOOTHE XP XTRA PROTECTION) SOLN Apply 1 drop to eye as needed (both eyes).     [provider]  B Complex-Biotin-FA (EQL B COMPLEX 100) TABS daily.     [provider]  Biotin 10 MG CAPS Take by mouth daily.    [provider]  bisacodyl (DULCOLAX) 5 MG EC tablet Take 5 mg by mouth daily as needed for moderate constipation.    [provider]  Calcium-Magnesium-Vitamin D (CALCIUM 1200+D3 PO) Take 1,200 mg by mouth daily. Citracal ER    [provider]  cholecalciferol (VITAMIN D) 1000 units tablet Take 1,000 Units by mouth daily.    [provider]  Coenzyme Q10 (CO Q-10) 100 MG CAPS Take 100 mg by mouth daily.     [provider]  diltiazem (CARDIZEM CD) 240 MG 24 hr capsule Take 1 capsule (240 mg total) by mouth daily. 02/24/21   Birdie Sons, MD  gabapentin (NEURONTIN) 300 MG capsule Take 1 capsule (300 mg total) by mouth 2 (two) times daily. 02/24/21   Birdie Sons, MD  Lactobacillus (ACIDOPHILUS PO) Take 1 tablet by mouth at bedtime.     [provider]  lidocaine (LIDODERM) 5 % Place 1 patch onto the skin daily. Remove & Discard patch within 12 hours 05/28/20   Birdie Sons, MD  lidocaine (LIDODERM) 5 % Place onto the skin. 05/28/20   [provider]  MEGARED OMEGA-3 KRILL OIL PO Take 300 mg by mouth daily.     [provider]  Menthol, Topical Analgesic, (ICY HOT BACK EX) Apply topically as needed.    [provider]  metoprolol succinate (TOPROL-XL) 25 MG 24 hr tablet Take 0.5 tablets (12.5 mg total) by mouth daily. 02/24/21   Birdie Sons, MD  oxybutynin (DITROPAN) 5 MG tablet TAKE 1 TABLET BY MOUTH  TWICE DAILY 06/20/21   Birdie Sons, MD  rivaroxaban (XARELTO) 20 MG TABS tablet TAKE 1 TABLET BY MOUTH DAILY WITH SUPPER 02/24/21   Birdie Sons, MD  traMADol  (ULTRAM) 50 MG tablet Take 1 tablet (50 mg total) by mouth every 6 (six) hours as needed. 11/18/19   Birdie Sons, MD  TURMERIC PO Take 800 mg by mouth 2 (two) times daily.    [provider]  Zinc 50 MG CAPS Take 25 mg by mouth daily.     [provider]  Zinc Acetate 50 MG CAPS Take by mouth.    [provider]     Allergies Aspirin   Family History  Problem Relation Age of Onset   Stroke Mother    Congestive Heart Failure Mother  Atrial fibrillation Mother    Melanoma Mother    Stroke Maternal Grandmother    Arthritis Paternal Grandmother        RA   Diabetes Paternal Grandfather     Social History Social History   Tobacco Use   Smoking status: Former    Types: Cigarettes    Quit date: 04/11/1972    Years since quitting: 49.2   Smokeless tobacco: Never  Vaping Use   Vaping Use: Never used  Substance Use Topics   Alcohol use: No    Alcohol/week: 0.0 standard drinks   Drug use: No    Review of Systems  Constitutional:   No fever or chills.  ENT:   No sore throat. No rhinorrhea. Cardiovascular:   No chest pain or syncope. Respiratory:   No dyspnea or cough. Gastrointestinal:   Negative for abdominal pain, vomiting and diarrhea.  Musculoskeletal:   Left hip pain as above  all other systems reviewed and are negative except as documented above in ROS and HPI.  ____________________________________________   PHYSICAL EXAM:  VITAL SIGNS: ED Triage Vitals  Enc Vitals Group     BP 07/01/21 1910 (!) 149/99     Pulse Rate 07/01/21 1910 (!) 121     Resp 07/01/21 2040 20     Temp 07/01/21 1911 98.4 F (36.9 C)     Temp Source 07/01/21 1911 Oral     SpO2 07/01/21 1910 98 %     Weight 07/01/21 1913 158 lb (71.7 kg)     Height 07/01/21 1913 5' (1.524 m)     Head Circumference --      Peak Flow --      Pain Score 07/01/21 1913 7     Pain Loc --      Pain Edu? --      Excl. in [place]? --     Vital signs reviewed, nursing assessments  reviewed.   Constitutional:   Alert and oriented. Non-toxic appearance. Eyes:   Conjunctivae are normal. EOMI. PERRL. ENT      Head:   Normocephalic and atraumatic.      Nose:   Wearing a mask.      Mouth/Throat:   Wearing a mask.      Neck:   No meningismus. Full ROM. Hematological/Lymphatic/Immunilogical:   No cervical lymphadenopathy. Cardiovascular:   Irregularly irregular rhythm, heart rate 100, rate controlled for atrial fibrillation. Symmetric bilateral radial and DP pulses.  No murmurs. Cap refill less than 2 seconds. Respiratory:   Normal respiratory effort without tachypnea/retractions. Breath sounds are clear and equal bilaterally. No wheezes/rales/rhonchi. Gastrointestinal:   Soft and nontender. Non distended. There is no CVA tenderness.  No rebound, rigidity, or guarding. Genitourinary:   deferred Musculoskeletal:   There is a large subcutaneous hematoma at the left hip with soft tissue swelling and tenderness.  No limb shortening or external rotation.  Normal distal pulses and cap refill. Neurologic:   Normal speech and language.  Motor grossly intact. No acute focal neurologic deficits are appreciated.  Skin:    Skin is warm, dry and intact. No rash noted.  No petechiae, purpura, or bullae.  ____________________________________________    LABS (pertinent positives/negatives) (all labs ordered are listed, but only abnormal results are displayed) Labs Reviewed  BASIC METABOLIC PANEL - Abnormal; Notable for the following components:      Result Value   Glucose, Bld 138 (*)    All other components within normal limits  CBC WITH DIFFERENTIAL/PLATELET - Abnormal; Notable  for the following components:   WBC 11.1 (*)    RBC 3.13 (*)    Hemoglobin 10.8 (*)    HCT 31.7 (*)    MCV 101.3 (*)    MCH 34.5 (*)    Neutro Abs 8.6 (*)    All other components within normal limits  PROTIME-INR - Abnormal; Notable for the following components:   Prothrombin Time 20.3 (*)    INR  1.7 (*)    All other components within normal limits  HEPARIN LEVEL (UNFRACTIONATED) - Abnormal; Notable for the following components:   Heparin Unfractionated >1.10 (*)    All other components within normal limits  APTT  BASIC METABOLIC PANEL  CBC   ____________________________________________   EKG    ____________________________________________    RADIOLOGY  CT HIP LEFT W CONTRAST  Result Date: 07/01/2021 CLINICAL DATA:  Tripped and fell, left hip pain EXAM: CT OF THE LOWER LEFT EXTREMITY WITH CONTRAST TECHNIQUE: Multidetector CT imaging of the lower left extremity was performed according to the standard protocol following intravenous contrast administration. CONTRAST:  158m OMNIPAQUE IOHEXOL 350 MG/ML SOLN COMPARISON:  07/01/2021 FINDINGS: Bones/Joint/Cartilage There are no acute displaced fractures. Mild joint space narrowing of the left hip compatible with osteoarthritis. Prominent spondylosis and facet hypertrophy at the lumbosacral junction. Ligaments Suboptimally assessed by CT. Muscles and Tendons No gross abnormalities. Soft tissues There is a large hematoma in the subcutaneous tissues in the left gluteal region, measuring up to 13.3 x 6.1 cm in transverse dimension and extending approximately 18.1 cm in craniocaudal extent. The visualized intrapelvic structures are unremarkable. Scattered diverticulosis of the distal colon is noted without diverticulitis. Vascular structures enhance normally. Reconstructed images demonstrate no additional findings. IMPRESSION: 1. Large subcutaneous hematoma within the left gluteal region. 2. No acute displaced fractures. Electronically Signed   By: MRanda NgoM.D.   On: 07/01/2021 22:15   DG Hip Unilat With Pelvis 2-3 Views Left  Result Date: 07/01/2021 CLINICAL DATA:  Fall and left hip pain. EXAM: DG HIP (WITH OR WITHOUT PELVIS) 2-3V LEFT COMPARISON:  None. FINDINGS: There is no acute fracture or dislocation. The bones are osteopenic. Mild  bilateral hip and SI joint arthritic changes. There is soft tissue contusion the lateral left hip. IMPRESSION: No acute fracture or dislocation. Electronically Signed   By: AAnner CreteM.D.   On: 07/01/2021 20:15    ____________________________________________   PROCEDURES Procedures  ____________________________________________  DIFFERENTIAL DIAGNOSIS   Hip fracture, subcutaneous hematoma, anemia, electrolyte abnormality  CLINICAL IMPRESSION / ASSESSMENT AND PLAN / ED COURSE  Medications ordered in the ED: Medications  morphine 4 MG/ML injection 4 mg (has no administration in time range)  traMADol (ULTRAM) tablet 50 mg (has no administration in time range)  diltiazem (CARDIZEM CD) 24 hr capsule 240 mg (has no administration in time range)  metoprolol succinate (TOPROL-XL) 24 hr tablet 12.5 mg (has no administration in time range)  bisacodyl (DULCOLAX) EC tablet 5 mg (has no administration in time range)  oxybutynin (DITROPAN) tablet 5 mg (has no administration in time range)  Co Q-10 CAPS 100 mg (has no administration in time range)  MegaRed Omega-3 Krill Oil CAPS 300 mg (has no administration in time range)  Turmeric CAPS 800 mg (has no administration in time range)  gabapentin (NEURONTIN) capsule 300 mg (has no administration in time range)  cholecalciferol (VITAMIN D3) tablet 1,000 Units (has no administration in time range)  Zinc CAPS 50 mg (has no administration in time range)  Soothe XP  Xtra Protection SOLN 1 drop (has no administration in time range)  lidocaine (LIDODERM) 5 % 1 patch (has no administration in time range)  0.9 %  sodium chloride infusion (has no administration in time range)  acetaminophen (TYLENOL) tablet 650 mg (has no administration in time range)    Or  acetaminophen (TYLENOL) suppository 650 mg (has no administration in time range)  traZODone (DESYREL) tablet 25 mg (has no administration in time range)  magnesium hydroxide (MILK OF MAGNESIA)  suspension 30 mL (has no administration in time range)  ondansetron (ZOFRAN) tablet 4 mg (has no administration in time range)    Or  ondansetron (ZOFRAN) injection 4 mg (has no administration in time range)  acetaminophen (TYLENOL) tablet 650 mg (650 mg Oral Given 07/01/21 1920)  morphine 2 MG/ML injection 2 mg (2 mg Intravenous Given 07/01/21 2123)  iohexol (OMNIPAQUE) 350 MG/ML injection 100 mL (100 mLs Intravenous Contrast Given 07/01/21 2152)  morphine 2 MG/ML injection 2 mg (2 mg Intravenous Given 07/01/21 2207)  morphine 4 MG/ML injection 4 mg (4 mg Intravenous Given 07/01/21 2301)  ondansetron (ZOFRAN) injection 4 mg (4 mg Intravenous Given 07/01/21 2301)    Pertinent labs & imaging results that were available during my care of the patient were reviewed by me and considered in my medical decision making (see chart for details).  [name] [name] was evaluated in Emergency Department on 07/01/2021 for the symptoms described in the history of present illness. She was evaluated in the context of the global COVID-19 pandemic, which necessitated consideration that the patient might be at risk for infection with the SARS-CoV-2 virus that causes COVID-19. Institutional protocols and algorithms that pertain to the evaluation of patients at risk for COVID-19 are in a state of rapid change based on information released by regulatory bodies including the CDC and federal and state organizations. These policies and algorithms were followed during the patient's care in the ED.   Patient presents with left hip pain and clinically apparent large subcutaneous hematoma after mechanical fall.  She is on Xarelto, last taken 24 hours ago.  X-rays negative for fracture.  CT of the left hip again negative for fracture but does demonstrate a large subcutaneous hematoma containing upwards of 1000 mL of blood.  It is mixed density, has appearance of fresh bleeding.  Labs demonstrate persistent anticoagulant  effect.  Currently hemodynamically stable but hemoglobin has decreased to 10.8 from a baseline of 12.0  2 weeks ago.  So far IV morphine has been ineffective for pain control.  With her anticoagulation she will need to be observed overnight for hemodynamic monitoring, serial H&H, possible surgical consult for hematoma evacuation to achieve pain relief.      ____________________________________________   FINAL CLINICAL IMPRESSION(S) / ED DIAGNOSES    Final diagnoses:  Fall, initial encounter  Subcutaneous hematoma  Anticoagulated     ED Discharge Orders     None       Portions of this note were generated with dragon dictation software. Dictation errors may occur despite best attempts at proofreading.    Carrie Mew, MD 07/01/21 2314

## 2021-07-01 NOTE — ED Triage Notes (Signed)
Pt in via EMS from home with c/o fall at home. Pt tripped on curb and hurt her left hip. Pt reports did not feel a pop and was able to ambulate using a walker but the pain has gotten worse.

## 2021-07-02 ENCOUNTER — Inpatient Hospital Stay: Payer: Medicare Other

## 2021-07-02 DIAGNOSIS — G459 Transient cerebral ischemic attack, unspecified: Secondary | ICD-10-CM | POA: Diagnosis not present

## 2021-07-02 DIAGNOSIS — W010XXA Fall on same level from slipping, tripping and stumbling without subsequent striking against object, initial encounter: Secondary | ICD-10-CM | POA: Diagnosis present

## 2021-07-02 DIAGNOSIS — R4701 Aphasia: Secondary | ICD-10-CM | POA: Diagnosis not present

## 2021-07-02 DIAGNOSIS — R2681 Unsteadiness on feet: Secondary | ICD-10-CM | POA: Diagnosis not present

## 2021-07-02 DIAGNOSIS — I69320 Aphasia following cerebral infarction: Secondary | ICD-10-CM | POA: Diagnosis not present

## 2021-07-02 DIAGNOSIS — R296 Repeated falls: Secondary | ICD-10-CM | POA: Diagnosis present

## 2021-07-02 DIAGNOSIS — D696 Thrombocytopenia, unspecified: Secondary | ICD-10-CM | POA: Diagnosis present

## 2021-07-02 DIAGNOSIS — W19XXXA Unspecified fall, initial encounter: Secondary | ICD-10-CM | POA: Diagnosis not present

## 2021-07-02 DIAGNOSIS — Z79899 Other long term (current) drug therapy: Secondary | ICD-10-CM | POA: Diagnosis not present

## 2021-07-02 DIAGNOSIS — R569 Unspecified convulsions: Secondary | ICD-10-CM | POA: Diagnosis not present

## 2021-07-02 DIAGNOSIS — Z8673 Personal history of transient ischemic attack (TIA), and cerebral infarction without residual deficits: Secondary | ICD-10-CM | POA: Diagnosis not present

## 2021-07-02 DIAGNOSIS — S7002XA Contusion of left hip, initial encounter: Secondary | ICD-10-CM | POA: Diagnosis not present

## 2021-07-02 DIAGNOSIS — N3281 Overactive bladder: Secondary | ICD-10-CM | POA: Diagnosis present

## 2021-07-02 DIAGNOSIS — M5136 Other intervertebral disc degeneration, lumbar region: Secondary | ICD-10-CM | POA: Diagnosis not present

## 2021-07-02 DIAGNOSIS — Z66 Do not resuscitate: Secondary | ICD-10-CM | POA: Diagnosis present

## 2021-07-02 DIAGNOSIS — M19012 Primary osteoarthritis, left shoulder: Secondary | ICD-10-CM | POA: Diagnosis not present

## 2021-07-02 DIAGNOSIS — M25559 Pain in unspecified hip: Secondary | ICD-10-CM | POA: Diagnosis present

## 2021-07-02 DIAGNOSIS — Z823 Family history of stroke: Secondary | ICD-10-CM | POA: Diagnosis not present

## 2021-07-02 DIAGNOSIS — N3289 Other specified disorders of bladder: Secondary | ICD-10-CM | POA: Diagnosis not present

## 2021-07-02 DIAGNOSIS — M6281 Muscle weakness (generalized): Secondary | ICD-10-CM | POA: Diagnosis not present

## 2021-07-02 DIAGNOSIS — I482 Chronic atrial fibrillation, unspecified: Secondary | ICD-10-CM | POA: Diagnosis present

## 2021-07-02 DIAGNOSIS — R278 Other lack of coordination: Secondary | ICD-10-CM | POA: Diagnosis not present

## 2021-07-02 DIAGNOSIS — R293 Abnormal posture: Secondary | ICD-10-CM | POA: Diagnosis not present

## 2021-07-02 DIAGNOSIS — Z20822 Contact with and (suspected) exposure to covid-19: Secondary | ICD-10-CM | POA: Diagnosis present

## 2021-07-02 DIAGNOSIS — T148XXA Other injury of unspecified body region, initial encounter: Secondary | ICD-10-CM | POA: Diagnosis not present

## 2021-07-02 DIAGNOSIS — S40012A Contusion of left shoulder, initial encounter: Secondary | ICD-10-CM | POA: Diagnosis not present

## 2021-07-02 DIAGNOSIS — Z86718 Personal history of other venous thrombosis and embolism: Secondary | ICD-10-CM | POA: Diagnosis not present

## 2021-07-02 DIAGNOSIS — I10 Essential (primary) hypertension: Secondary | ICD-10-CM | POA: Diagnosis present

## 2021-07-02 DIAGNOSIS — Z79891 Long term (current) use of opiate analgesic: Secondary | ICD-10-CM | POA: Diagnosis not present

## 2021-07-02 DIAGNOSIS — M25512 Pain in left shoulder: Secondary | ICD-10-CM | POA: Diagnosis not present

## 2021-07-02 DIAGNOSIS — I639 Cerebral infarction, unspecified: Secondary | ICD-10-CM | POA: Diagnosis not present

## 2021-07-02 DIAGNOSIS — S7002XD Contusion of left hip, subsequent encounter: Secondary | ICD-10-CM | POA: Diagnosis not present

## 2021-07-02 DIAGNOSIS — Z7901 Long term (current) use of anticoagulants: Secondary | ICD-10-CM | POA: Diagnosis not present

## 2021-07-02 DIAGNOSIS — Z87891 Personal history of nicotine dependence: Secondary | ICD-10-CM | POA: Diagnosis not present

## 2021-07-02 DIAGNOSIS — Z741 Need for assistance with personal care: Secondary | ICD-10-CM | POA: Diagnosis not present

## 2021-07-02 DIAGNOSIS — G319 Degenerative disease of nervous system, unspecified: Secondary | ICD-10-CM | POA: Diagnosis not present

## 2021-07-02 DIAGNOSIS — M6259 Muscle wasting and atrophy, not elsewhere classified, multiple sites: Secondary | ICD-10-CM | POA: Diagnosis not present

## 2021-07-02 DIAGNOSIS — G894 Chronic pain syndrome: Secondary | ICD-10-CM | POA: Diagnosis present

## 2021-07-02 DIAGNOSIS — D72829 Elevated white blood cell count, unspecified: Secondary | ICD-10-CM | POA: Diagnosis present

## 2021-07-02 DIAGNOSIS — G40909 Epilepsy, unspecified, not intractable, without status epilepticus: Secondary | ICD-10-CM | POA: Diagnosis present

## 2021-07-02 DIAGNOSIS — R2689 Other abnormalities of gait and mobility: Secondary | ICD-10-CM | POA: Diagnosis not present

## 2021-07-02 DIAGNOSIS — Z7983 Long term (current) use of bisphosphonates: Secondary | ICD-10-CM | POA: Diagnosis not present

## 2021-07-02 DIAGNOSIS — Z886 Allergy status to analgesic agent status: Secondary | ICD-10-CM | POA: Diagnosis not present

## 2021-07-02 DIAGNOSIS — M810 Age-related osteoporosis without current pathological fracture: Secondary | ICD-10-CM | POA: Diagnosis present

## 2021-07-02 DIAGNOSIS — Z96653 Presence of artificial knee joint, bilateral: Secondary | ICD-10-CM | POA: Diagnosis present

## 2021-07-02 LAB — BASIC METABOLIC PANEL
Anion gap: 6 (ref 5–15)
BUN: 19 mg/dL (ref 8–23)
CO2: 23 mmol/L (ref 22–32)
Calcium: 8.8 mg/dL — ABNORMAL LOW (ref 8.9–10.3)
Chloride: 107 mmol/L (ref 98–111)
Creatinine, Ser: 0.54 mg/dL (ref 0.44–1.00)
GFR, Estimated: >60 mL/min (ref >60)
Glucose, Bld: 121 mg/dL — ABNORMAL HIGH (ref 70–99)
Potassium: 4.1 mmol/L (ref 3.5–5.1)
Sodium: 136 mmol/L (ref 135–145)

## 2021-07-02 LAB — CBC
HCT: 27.7 % — ABNORMAL LOW (ref 36.0–46.0)
Hemoglobin: 9.1 g/dL — ABNORMAL LOW (ref 12.0–15.0)
MCH: 33.7 pg (ref 26.0–34.0)
MCHC: 32.9 g/dL (ref 30.0–36.0)
MCV: 102.6 fL — ABNORMAL HIGH (ref 80.0–100.0)
Platelets: 159 10*3/uL (ref 150–400)
RBC: 2.7 MIL/uL — ABNORMAL LOW (ref 3.87–5.11)
RDW: 14.2 % (ref 11.5–15.5)
WBC: 8.1 10*3/uL (ref 4.0–10.5)
nRBC: 0 % (ref 0.0–0.2)

## 2021-07-02 LAB — HEMOGLOBIN AND HEMATOCRIT, BLOOD
HCT: 27.2 % — ABNORMAL LOW (ref 36.0–46.0)
HCT: 28.1 % — ABNORMAL LOW (ref 36.0–46.0)
HCT: 29.1 % — ABNORMAL LOW (ref 36.0–46.0)
Hemoglobin: 9 g/dL — ABNORMAL LOW (ref 12.0–15.0)
Hemoglobin: 9 g/dL — ABNORMAL LOW (ref 12.0–15.0)
Hemoglobin: 9.8 g/dL — ABNORMAL LOW (ref 12.0–15.0)

## 2021-07-02 LAB — RESP PANEL BY RT-PCR (FLU A&B, COVID) ARPGX2
Influenza A by PCR: NEGATIVE
Influenza B by PCR: NEGATIVE
SARS Coronavirus 2 by RT PCR: NEGATIVE

## 2021-07-02 NOTE — Consult Note (Signed)
Reason for Consult: Large left hip hematoma Referring Physician: Dr. Roma Kayser is an 80 y.o. female.  HPI: Patient is a 80 year old who reports multiple falls and falling yesterday developed severe pain to the lateral aspect of the hip and was brought to the emergency room where she was found to have a very large hematoma with underlying hip osteoarthritis.  She has a great deal of difficulty walking.  She is a poor historian as I talked with her about her home situation and ambulatory ability.  She complains of severe pain to the lateral aspect of the hip  Past Medical History:  Diagnosis Date   Arthritis    osteo; in B knees;    Bladder disorder    Chicken pox    Chronic a-fib ([place] [place])    DVT (deep venous thrombosis) ([facility])    H/O   Dyspnea    Dysrhythmia    a fib   Edema    Hay fever    Heart murmur    Hypertension    controlled   Irregular heart beat    Scoliosis    Seizure ([facility])    2 years ago knocked unconscious; hospitalized and diagnosed with seizures; no spells in last year;    Sepsis ([place]) 03/11/2019   Stroke ([place])    1 month ago (possible stroke)   Unspecified atrial fibrillation [facility])     Past Surgical History:  Procedure Laterality Date   bilateral wrist surgery  2003   BREAST BIOPSY Left    stereo,benign   [place] AND ABLATION  2007   Done in [place], [place] for atrial flutter   CATARACT EXTRACTION W/PHACO Right 06/01/2015   Procedure: CATARACT EXTRACTION PHACO AND INTRAOCULAR LENS PLACEMENT ([place]);  Surgeon: Birder Robson, MD;  Location: [facility] ORS;  Service: Ophthalmology;  Laterality: Right;  [place]: 01:02.0 AP%: 23.1 CDE: 14.33 Fluid lot# [id number]    CATARACT EXTRACTION W/PHACO Left 06/22/2015   Procedure: CATARACT EXTRACTION PHACO AND INTRAOCULAR LENS PLACEMENT ([facility]);  Surgeon: Birder Robson, MD;  Location: [facility] ORS;  Service: Ophthalmology;  Laterality: Left;  [place]: 01:00.7 AP%: 23.1 CDE:  14.01 Lot # [id number]   COLONOSCOPY     DILATION AND CURETTAGE OF UTERUS     JOINT REPLACEMENT     KNEE ARTHROPLASTY Right 03/01/2016   Procedure: COMPUTER ASSISTED TOTAL KNEE ARTHROPLASTY;  Surgeon: Dereck Leep, MD;  Location: [facility] ORS;  Service: Orthopedics;  Laterality: Right;   KNEE ARTHROPLASTY Left 04/23/2017   Procedure: COMPUTER ASSISTED TOTAL KNEE ARTHROPLASTY;  Surgeon: Dereck Leep, MD;  Location: [facility] ORS;  Service: Orthopedics;  Laterality: Left;   LAMINECTOMY     TONSILLECTOMY     TOTAL KNEE ARTHROPLASTY Right     Family History  Problem Relation Age of Onset   Stroke Mother    Congestive Heart Failure Mother    Atrial fibrillation Mother    Melanoma Mother    Stroke Maternal Grandmother    Arthritis Paternal Grandmother        RA   Diabetes Paternal Grandfather     Social History:  reports that she quit smoking about 49 years ago. Her smoking use included cigarettes. She has never used smokeless tobacco. She reports that she does not drink alcohol and does not use drugs.  Allergies:  Allergies  Allergen Reactions   Aspirin Other (See Comments)    On xarelto    Medications: I have reviewed the patient's current  medications.  Results for orders placed or performed during the hospital encounter of 07/01/21 (from the past 48 hour(s))  Basic metabolic panel     Status: Abnormal   Collection Time: 07/01/21  9:14 PM  Result Value Ref Range   Sodium 135 135 - 145 mmol/L   Potassium 4.1 3.5 - 5.1 mmol/L   Chloride 104 98 - 111 mmol/L   CO2 23 22 - 32 mmol/L   Glucose, Bld 138 (H) 70 - 99 mg/dL    Comment: Glucose reference range applies only to samples taken after fasting for at least 8 hours.   BUN 20 8 - 23 mg/dL   Creatinine, Ser 0.69 0.44 - 1.00 mg/dL   Calcium 9.6 8.9 - 10.3 mg/dL   GFR, Estimated >60 >60 mL/min    Comment: (NOTE) Calculated using the CKD-EPI Creatinine Equation (2021)    Anion gap 8 5 - 15    Comment: Performed at [facility], [address] [place], [place] [address]  CBC with Differential     Status: Abnormal   Collection Time: 07/01/21  9:14 PM  Result Value Ref Range   WBC 11.1 (H) 4.0 - 10.5 K/uL   RBC 3.13 (L) 3.87 - 5.11 MIL/uL   Hemoglobin 10.8 (L) 12.0 - 15.0 g/dL   HCT 31.7 (L) 36.0 - 46.0 %   MCV 101.3 (H) 80.0 - 100.0 fL   MCH 34.5 (H) 26.0 - 34.0 pg   MCHC 34.1 30.0 - 36.0 g/dL   RDW 14.0 11.5 - 15.5 %   Platelets 185 150 - 400 K/uL   nRBC 0.0 0.0 - 0.2 %   Neutrophils Relative % 76 %   Neutro Abs 8.6 (H) 1.7 - 7.7 K/uL   Lymphocytes Relative 13 %   Lymphs Abs 1.4 0.7 - 4.0 K/uL   Monocytes Relative 8 %   Monocytes Absolute 0.9 0.1 - 1.0 K/uL   Eosinophils Relative 1 %   Eosinophils Absolute 0.1 0.0 - 0.5 K/uL   Basophils Relative 1 %   Basophils Absolute 0.1 0.0 - 0.1 K/uL   Immature Granulocytes 1 %   Abs Immature Granulocytes 0.06 0.00 - 0.07 K/uL    Comment: Performed at [facility], [address] [place], [place] [address]  Protime-INR     Status: Abnormal   Collection Time: 07/01/21  9:14 PM  Result Value Ref Range   Prothrombin Time 20.3 (H) 11.4 - 15.2 seconds   INR 1.7 (H) 0.8 - 1.2    Comment: (NOTE) INR goal varies based on device and disease states. Performed at [facility], [address] [place], [place] [address]   APTT     Status: None   Collection Time: 07/01/21  9:14 PM  Result Value Ref Range   aPTT 29 24 - 36 seconds    Comment: Performed at [facility], [address] [place] , [place] [address]  Heparin level     Status: Abnormal   Collection Time: 07/01/21  9:14 PM  Result Value Ref Range   Heparin Unfractionated >1.10 (H) 0.30 - 0.70 IU/mL    Comment: (NOTE) The clinical reportable range upper limit is being lowered to >1.10 to align with the FDA approved guidance for the current laboratory assay.  If heparin results are below expected values, and patient dosage has  been confirmed, suggest follow up testing  of antithrombin III levels. Performed at [facility], [address] [place],  [address]  Resp Panel by RT-PCR (Flu A&B, Covid) Nasopharyngeal Swab     Status: None   Collection Time: 07/01/21 11:40 PM   Specimen: Nasopharyngeal Swab; Nasopharyngeal(NP) swabs in vial transport medium  Result Value Ref Range   SARS Coronavirus 2 by RT PCR NEGATIVE NEGATIVE    Comment: (NOTE) SARS-CoV-2 target nucleic acids are NOT DETECTED.  The SARS-CoV-2 RNA is generally detectable in upper respiratory specimens during the acute phase of infection. The lowest concentration of SARS-CoV-2 viral copies this assay can detect is 138 copies/mL. A negative result does not preclude SARS-Cov-2 infection and should not be used as the sole basis for treatment or other patient management decisions. A negative result may occur with  improper specimen collection/handling, submission of specimen other than nasopharyngeal swab, presence of viral mutation(s) within the areas targeted by this assay, and inadequate number of viral copies(<138 copies/mL). A negative result must be combined with clinical observations, patient history, and epidemiological information. The expected result is Negative.  Fact Sheet for Patients:  EntrepreneurPulse.com.au  Fact Sheet for Healthcare Providers:  IncredibleEmployment.be  This test is no t yet approved or cleared by the [place] FDA and  has been authorized for detection and/or diagnosis of SARS-CoV-2 by FDA under an Emergency Use Authorization (EUA). This EUA will remain  in effect (meaning this test can be used) for the duration of the COVID-19 declaration under Section 564(b)(1) of the Act, 21 U.S.C.section 360bbb-3(b)(1), unless the authorization is terminated  or revoked sooner.       Influenza A by PCR NEGATIVE NEGATIVE   Influenza B by PCR NEGATIVE NEGATIVE    Comment: (NOTE) The Xpert Xpress  SARS-CoV-2/FLU/RSV plus assay is intended as an aid in the diagnosis of influenza from Nasopharyngeal swab specimens and should not be used as a sole basis for treatment. Nasal washings and aspirates are unacceptable for Xpert Xpress SARS-CoV-2/FLU/RSV testing.  Fact Sheet for Patients: EntrepreneurPulse.com.au  Fact Sheet for Healthcare Providers: IncredibleEmployment.be  This test is not yet approved or cleared by the [place] FDA and has been authorized for detection and/or diagnosis of SARS-CoV-2 by FDA under an Emergency Use Authorization (EUA). This EUA will remain in effect (meaning this test can be used) for the duration of the COVID-19 declaration under Section 564(b)(1) of the Act, 21 U.S.C. section 360bbb-3(b)(1), unless the authorization is terminated or revoked.  Performed at [facility], [address] [place], [place] [address]   Hemoglobin and hematocrit, blood     Status: Abnormal   Collection Time: 07/01/21 11:40 PM  Result Value Ref Range   Hemoglobin 9.8 (L) 12.0 - 15.0 g/dL   HCT 29.1 (L) 36.0 - 46.0 %    Comment: Performed at [facility], [address], [place], [place] [id number]  Basic metabolic panel     Status: Abnormal   Collection Time: 07/02/21  5:29 AM  Result Value Ref Range   Sodium 136 135 - 145 mmol/L   Potassium 4.1 3.5 - 5.1 mmol/L   Chloride 107 98 - 111 mmol/L   CO2 23 22 - 32 mmol/L   Glucose, Bld 121 (H) 70 - 99 mg/dL    Comment: Glucose reference range applies only to samples taken after fasting for at least 8 hours.   BUN 19 8 - 23 mg/dL   Creatinine, Ser 0.54 0.44 - 1.00 mg/dL   Calcium 8.8 (L) 8.9 - 10.3 mg/dL   GFR, Estimated >60 >60 mL/min    Comment: (NOTE) Calculated using  the CKD-EPI Creatinine Equation (2021)    Anion gap 6 5 - 15    Comment: Performed at [facility], [address] [place], [place] [address]  CBC     Status: Abnormal    Collection Time: 07/02/21  5:29 AM  Result Value Ref Range   WBC 8.1 4.0 - 10.5 K/uL   RBC 2.70 (L) 3.87 - 5.11 MIL/uL   Hemoglobin 9.1 (L) 12.0 - 15.0 g/dL   HCT 27.7 (L) 36.0 - 46.0 %   MCV 102.6 (H) 80.0 - 100.0 fL   MCH 33.7 26.0 - 34.0 pg   MCHC 32.9 30.0 - 36.0 g/dL   RDW 14.2 11.5 - 15.5 %   Platelets 159 150 - 400 K/uL   nRBC 0.0 0.0 - 0.2 %    Comment: Performed at [facility], [address] [place], [place] [address]    CT HIP LEFT W CONTRAST  Result Date: 07/01/2021 CLINICAL DATA:  Tripped and fell, left hip pain EXAM: CT OF THE LOWER LEFT EXTREMITY WITH CONTRAST TECHNIQUE: Multidetector CT imaging of the lower left extremity was performed according to the standard protocol following intravenous contrast administration. CONTRAST:  153m OMNIPAQUE IOHEXOL 350 MG/ML SOLN COMPARISON:  07/01/2021 FINDINGS: Bones/Joint/Cartilage There are no acute displaced fractures. Mild joint space narrowing of the left hip compatible with osteoarthritis. Prominent spondylosis and facet hypertrophy at the lumbosacral junction. Ligaments Suboptimally assessed by CT. Muscles and Tendons No gross abnormalities. Soft tissues There is a large hematoma in the subcutaneous tissues in the left gluteal region, measuring up to 13.3 x 6.1 cm in transverse dimension and extending approximately 18.1 cm in craniocaudal extent. The visualized intrapelvic structures are unremarkable. Scattered diverticulosis of the distal colon is noted without diverticulitis. Vascular structures enhance normally. Reconstructed images demonstrate no additional findings. IMPRESSION: 1. Large subcutaneous hematoma within the left gluteal region. 2. No acute displaced fractures. Electronically Signed   By: MRanda NgoM.D.   On: 07/01/2021 22:15   DG Hip Unilat With Pelvis 2-3 Views Left  Result Date: 07/01/2021 CLINICAL DATA:  Fall and left hip pain. EXAM: DG HIP (WITH OR WITHOUT PELVIS) 2-3V LEFT COMPARISON:  None.  FINDINGS: There is no acute fracture or dislocation. The bones are osteopenic. Mild bilateral hip and SI joint arthritic changes. There is soft tissue contusion the lateral left hip. IMPRESSION: No acute fracture or dislocation. Electronically Signed   By: AAnner CreteM.D.   On: 07/01/2021 20:15    Review of Systems Blood pressure 94/63, pulse 99, temperature 98.5 F (36.9 C), resp. rate 15, height 5' (1.524 m), weight 75.6 kg, SpO2 96 %. Physical Exam There is a large area of ecchymosis to the lateral hip approximately 20 cm proximal to distal 15 cm anterior to posterior with large amount of swelling and tenderness.  There is a small amount of fluctuance present but most of the bruising seems to be within the tissue. Assessment/Plan: Large hematoma subcutaneous without Morel Lavalle lesion.  I suspect if she can tolerate it and lay on the side as well as with ice packs this may resolve on its own without surgical intervention.  We will continue to follow and if it worsens or becomes more fluctuant would recommend incision and drainage in the operating room.  MHessie Knows9/07/2021, 10:00 AM

## 2021-07-02 NOTE — Plan of Care (Signed)

## 2021-07-02 NOTE — Progress Notes (Signed)
Patient ID: [name] [name], female   DOB: 1941-03-18, 80 y.o.   MRN: [id number]  PROGRESS NOTE    Rokhaya Patchell Keegan  [id number] DOB: 01/05/1941 DOA: 07/01/2021 PCP: Birdie Sons, MD   Brief Narrative:  80 y.o. female with medical history significant for hypertension, chronic atrial fibrillation on Xarelto, DVT, seizure disorder and CVA presented with fall and left hip pain.  On presentation, hemoglobin was 10.8 (hemoglobin of 12 on 06/14/2021) with INR of 1.7.  Hip x-ray showed no acute fracture or dislocation.  CT of the head showed no acute intracranial abnormalities. Left hip CT showed large subcutaneous hematoma within the left gluteal region with no acute displaced fractures.  Orthopedics was consulted.  Assessment & Plan:   Large left gluteal subcutaneous hematoma after a mechanical fall -Await orthopedics recommendations.  Fall precautions.  PT eval.  Xarelto on hold.  Monitor H&H.  Chronic atrial fibrillation -Currently rate better controlled.  Xarelto on hold  Hypertension -Blood pressure currently on the lower side.  Hold Cardizem CD and Toprol-XL for now.  Monitor  Overactive bladder -Continue Ditropan  DVT prophylaxis: SCDs Code Status: DNR Family Communication: None at bedside Disposition Plan: Status is: Observation  The patient will require care spanning > 2 midnights and should be moved to inpatient because: Inpatient level of care appropriate due to severity of illness  Dispo: The patient is from: Home              Anticipated d/c is to: Home              Patient currently is not medically stable to d/c.   Difficult to place patient No  Consultants: Orthopedics  Procedures: None  Antimicrobials: None   Subjective: Patient seen and examined at bedside.  Awake, poor historian.  No overnight fever, vomiting, agitation or seizures reported.  Objective: Vitals:   07/02/21 0000 07/02/21 0127 07/02/21 0617 07/02/21 0754  BP: 114/60 107/63 128/64  94/63  Pulse: (!) 114 (!) 103 (!) 105 99  Resp: '20 14 16 15  '$ Temp: 98.7 F (37.1 C) 98.1 F (36.7 C) 98.5 F (36.9 C) 98.5 F (36.9 C)  TempSrc: Oral     SpO2: 94% 97% 94% 96%  Weight:  75.6 kg    Height:  5' (1.524 m)      Intake/Output Summary (Last 24 hours) at 07/02/2021 1116 Last data filed at 07/02/2021 1037 Gross per 24 hour  Intake 913.8 ml  Output 100 ml  Net 813.8 ml   Filed Weights   07/01/21 1913 07/02/21 0127  Weight: 71.7 kg 75.6 kg    Examination:  General exam: Appears calm and comfortable.  Looks chronically ill and deconditioned.  Currently on room air. Respiratory system: Bilateral decreased breath sounds at bases Cardiovascular system: S1 & S2 heard, intermittently tachycardic gastrointestinal system: Abdomen is nondistended, soft and nontender. Normal bowel sounds heard. Extremities: No cyanosis, clubbing, edema.  No left hip tenderness present Central nervous system: Awake, slow to respond, poor historian. No focal neurological deficits. Moving extremities Skin: Left lateral hip ecchymosis present.  No other lesions Psychiatry: Flat affect.  Intermittently gets anxious.   Data Reviewed: I have personally reviewed following labs and imaging studies  CBC: Recent Labs  Lab 07/01/21 2114 07/01/21 2340 07/02/21 0529  WBC 11.1*  --  8.1  NEUTROABS 8.6*  --   --   HGB 10.8* 9.8* 9.1*  HCT 31.7* 29.1* 27.7*  MCV 101.3*  --  102.6*  PLT 185  --  [id number]   Basic Metabolic Panel: Recent Labs  Lab 07/01/21 2114 07/02/21 0529  NA 135 136  K 4.1 4.1  CL 104 107  CO2 23 23  GLUCOSE 138* 121*  BUN 20 19  CREATININE 0.69 0.54  CALCIUM 9.6 8.8*   GFR: Estimated Creatinine Clearance: 51.8 mL/min (by C-G formula based on SCr of 0.54 mg/dL). Liver Function Tests: No results for input(s): AST, ALT, ALKPHOS, BILITOT, PROT, ALBUMIN in the last 168 hours. No results for input(s): LIPASE, AMYLASE in the last 168 hours. No results for input(s): AMMONIA in  the last 168 hours. Coagulation Profile: Recent Labs  Lab 07/01/21 2114  INR 1.7*   Cardiac Enzymes: No results for input(s): CKTOTAL, CKMB, CKMBINDEX, TROPONINI in the last 168 hours. BNP (last 3 results) No results for input(s): PROBNP in the last 8760 hours. HbA1C: No results for input(s): HGBA1C in the last 72 hours. CBG: No results for input(s): GLUCAP in the last 168 hours. Lipid Profile: No results for input(s): CHOL, HDL, LDLCALC, TRIG, CHOLHDL, LDLDIRECT in the last 72 hours. Thyroid Function Tests: No results for input(s): TSH, T4TOTAL, FREET4, T3FREE, THYROIDAB in the last 72 hours. Anemia Panel: No results for input(s): VITAMINB12, FOLATE, FERRITIN, TIBC, IRON, RETICCTPCT in the last 72 hours. Sepsis Labs: No results for input(s): PROCALCITON, LATICACIDVEN in the last 168 hours.  Recent Results (from the past 240 hour(s))  Resp Panel by RT-PCR (Flu A&B, Covid) Nasopharyngeal Swab     Status: None   Collection Time: 07/01/21 11:40 PM   Specimen: Nasopharyngeal Swab; Nasopharyngeal(NP) swabs in vial transport medium  Result Value Ref Range Status   SARS Coronavirus 2 by RT PCR NEGATIVE NEGATIVE Final    Comment: (NOTE) SARS-CoV-2 target nucleic acids are NOT DETECTED.  The SARS-CoV-2 RNA is generally detectable in upper respiratory specimens during the acute phase of infection. The lowest concentration of SARS-CoV-2 viral copies this assay can detect is 138 copies/mL. A negative result does not preclude SARS-Cov-2 infection and should not be used as the sole basis for treatment or other patient management decisions. A negative result may occur with  improper specimen collection/handling, submission of specimen other than nasopharyngeal swab, presence of viral mutation(s) within the areas targeted by this assay, and inadequate number of viral copies(<138 copies/mL). A negative result must be combined with clinical observations, patient history, and  epidemiological information. The expected result is Negative.  Fact Sheet for Patients:  EntrepreneurPulse.com.au  Fact Sheet for Healthcare Providers:  IncredibleEmployment.be  This test is no t yet approved or cleared by the [place] FDA and  has been authorized for detection and/or diagnosis of SARS-CoV-2 by FDA under an Emergency Use Authorization (EUA). This EUA will remain  in effect (meaning this test can be used) for the duration of the COVID-19 declaration under Section 564(b)(1) of the Act, 21 U.S.C.section 360bbb-3(b)(1), unless the authorization is terminated  or revoked sooner.       Influenza A by PCR NEGATIVE NEGATIVE Final   Influenza B by PCR NEGATIVE NEGATIVE Final    Comment: (NOTE) The Xpert Xpress SARS-CoV-2/FLU/RSV plus assay is intended as an aid in the diagnosis of influenza from Nasopharyngeal swab specimens and should not be used as a sole basis for treatment. Nasal washings and aspirates are unacceptable for Xpert Xpress SARS-CoV-2/FLU/RSV testing.  Fact Sheet for Patients: EntrepreneurPulse.com.au  Fact Sheet for Healthcare Providers: IncredibleEmployment.be  This test is not yet approved or cleared by the [place] and has been authorized for  detection and/or diagnosis of SARS-CoV-2 by FDA under an Emergency Use Authorization (EUA). This EUA will remain in effect (meaning this test can be used) for the duration of the COVID-19 declaration under Section 564(b)(1) of the Act, 21 U.S.C. section 360bbb-3(b)(1), unless the authorization is terminated or revoked.  Performed at [facility], [address] [place], [place] [address]          Radiology Studies: CT HIP LEFT W CONTRAST  Result Date: 07/01/2021 CLINICAL DATA:  Tripped and fell, left hip pain EXAM: CT OF THE LOWER LEFT EXTREMITY WITH CONTRAST TECHNIQUE: Multidetector CT imaging of the  lower left extremity was performed according to the standard protocol following intravenous contrast administration. CONTRAST:  114m OMNIPAQUE IOHEXOL 350 MG/ML SOLN COMPARISON:  07/01/2021 FINDINGS: Bones/Joint/Cartilage There are no acute displaced fractures. Mild joint space narrowing of the left hip compatible with osteoarthritis. Prominent spondylosis and facet hypertrophy at the lumbosacral junction. Ligaments Suboptimally assessed by CT. Muscles and Tendons No gross abnormalities. Soft tissues There is a large hematoma in the subcutaneous tissues in the left gluteal region, measuring up to 13.3 x 6.1 cm in transverse dimension and extending approximately 18.1 cm in craniocaudal extent. The visualized intrapelvic structures are unremarkable. Scattered diverticulosis of the distal colon is noted without diverticulitis. Vascular structures enhance normally. Reconstructed images demonstrate no additional findings. IMPRESSION: 1. Large subcutaneous hematoma within the left gluteal region. 2. No acute displaced fractures. Electronically Signed   By: MRanda NgoM.D.   On: 07/01/2021 22:15   DG Hip Unilat With Pelvis 2-3 Views Left  Result Date: 07/01/2021 CLINICAL DATA:  Fall and left hip pain. EXAM: DG HIP (WITH OR WITHOUT PELVIS) 2-3V LEFT COMPARISON:  None. FINDINGS: There is no acute fracture or dislocation. The bones are osteopenic. Mild bilateral hip and SI joint arthritic changes. There is soft tissue contusion the lateral left hip. IMPRESSION: No acute fracture or dislocation. Electronically Signed   By: AAnner CreteM.D.   On: 07/01/2021 20:15        Scheduled Meds:  cholecalciferol  1,000 Units Oral Daily   diltiazem  240 mg Oral Daily   gabapentin  300 mg Oral BID   lidocaine  1 patch Transdermal Q24H   metoprolol succinate  12.5 mg Oral Daily   omega-3 acid ethyl esters  1 g Oral Daily   oxybutynin  5 mg Oral BID   zinc sulfate  220 mg Oral Daily   Continuous Infusions:   sodium chloride 100 mL/hr at 07/02/21 1003          Lyrik Dockstader, MD [facility] 07/02/2021, 11:16 AM

## 2021-07-02 NOTE — Evaluation (Signed)
Physical Therapy Evaluation Patient Details Name: [name] [name] MRN: [id number] DOB: January 20, 1941 Today's Date: 07/02/2021   History of Present Illness  [name] [name] is a 80 y.o. female with medical history significant for hypertension, chronic atrial fibrillation on Xarelto, DVT, seizure disorder and CVA who presented to the emergency room with acute onset of mechanical fall at home with subsequent severe left hip pain which has been constant and worsening with movement, L HIP CTT: no acute fx, Large subcutaneous hematoma within the left gluteal region.    Clinical Impression  Pt requiring +2 modA for safety in coming into standing position.  Pt difficult to communicate with at times and is only able to answer questions sparingly.  Pt also has large amounts of pain based on facial expression, but reports 3/10 pain when asked.  MD's consulted about imaging on the L UE as well due to pt noting significant amount of pain in the arm.  Pt unable to ambulate at this current time.  Pt will benefit from skilled PT intervention to increase independence and safety with basic mobility in preparation for discharge to the venue listed below.       Follow Up Recommendations SNF;Supervision/Assistance - 24 hour    Equipment Recommendations  Other (comment) (determined by next venue of care.)    Recommendations for Other Services       Precautions / Restrictions Precautions Precautions: Fall Restrictions Weight Bearing Restrictions: Yes LLE Weight Bearing: Weight bearing as tolerated      Mobility  Bed Mobility Overal bed mobility: Needs Assistance Bed Mobility: Sit to Supine;Supine to Sit     Supine to sit: Max assist Sit to supine: Max assist;+2 for physical assistance        Transfers Overall transfer level: Needs assistance Equipment used: Rolling walker (2 wheeled) Transfers: Sit to/from Stand Sit to Stand: Mod assist;+2 safety/equipment;From elevated surface             Ambulation/Gait                Stairs            Wheelchair Mobility    Modified Rankin (Stroke Patients Only)       Balance Overall balance assessment: Needs assistance Sitting-balance support: No upper extremity supported;Feet supported Sitting balance-Leahy Scale: Fair     Standing balance support: Bilateral upper extremity supported Standing balance-Leahy Scale: Poor                               Pertinent Vitals/Pain Pain Assessment: Faces Pain Score: 3  Faces Pain Scale: Hurts worst Pain Location: L hip and L shoulder Pain Descriptors / Indicators: Discomfort;Dull;Grimacing Pain Intervention(s): Limited activity within patient's tolerance;Repositioned;Ice applied    Home Living Family/patient expects to be discharged to:: Assisted living               Home Equipment: Gilford Rile - 4 wheels;Cane - single point Additional Comments: Pt unable or unwilling to answer all questions re home setup and PLOF - states lives in apartment facility where meals and housekeeping are provided. Initially states using SPC more frequently then states 4WW    Prior Function Level of Independence: Independent with assistive device(s)               Hand Dominance   Dominant Hand: Right    Extremity/Trunk Assessment   Upper Extremity Assessment Upper Extremity Assessment: LUE deficits/detail LUE Deficits / Details: initially  refuses all LUE A/PROM, eventually demonstrates [facility] elbow flexion/extension, achieves ~30* shoulder flexion LUE: Unable to fully assess due to pain    Lower Extremity Assessment Lower Extremity Assessment: Generalized weakness       Communication   Communication: Other (comment) (inconsistentyl responds)  Cognition Arousal/Alertness: Awake/alert Behavior During Therapy: Flat affect Overall Cognitive Status: No family/caregiver present to determine baseline cognitive functioning                                  General Comments: states location as [place], does not answer questions for year or month. Inconsistently responds to all questions      General Comments      Exercises Other Exercises Other Exercises: Pt educated re: OT role, DME recs, d/c recs, falls prevention Other Exercises: LBD, sup<>sit, sit<>stnad, sitting/standing balance/tolerance   Assessment/Plan    PT Assessment Patient needs continued PT services  PT Problem List Decreased strength;Decreased activity tolerance;Decreased balance;Decreased mobility;Decreased cognition;Pain       PT Treatment Interventions DME instruction;Gait training;Functional mobility training;Therapeutic activities;Therapeutic exercise;Balance training;Neuromuscular re-education    PT Goals (Current goals can be found in the Care Plan section)  Acute Rehab PT Goals Patient Stated Goal: to improve pain PT Goal Formulation: With patient Time For Goal Achievement: 07/16/21 Potential to Achieve Goals: Good    Frequency Min 2X/week   Barriers to discharge Other (comment) Unable to assess due to pt not being able to coherently communicate with therapist.    Co-evaluation               AM-PAC PT "6 Clicks" Mobility  Outcome Measure Help needed turning from your back to your side while in a flat bed without using bedrails?: A Lot Help needed moving from lying on your back to sitting on the side of a flat bed without using bedrails?: A Lot Help needed moving to and from a bed to a chair (including a wheelchair)?: A Lot Help needed standing up from a chair using your arms (e.g., wheelchair or bedside chair)?: A Lot Help needed to walk in hospital room?: Total Help needed climbing 3-5 steps with a railing? : Total 6 Click Score: 10    End of Session Equipment Utilized During Treatment: Gait belt Activity Tolerance: Patient limited by pain Patient left: in bed;with call bell/phone within reach;with bed alarm set Nurse Communication:  Mobility status PT Visit Diagnosis: Unsteadiness on feet (R26.81);Repeated falls (R29.6);Muscle weakness (generalized) (M62.81);History of falling (Z91.81);Difficulty in walking, not elsewhere classified (R26.2);Pain Pain - Right/Left: Left Pain - part of body: Shoulder;Hip    Time: 1449-1510 PT Time Calculation (min) (ACUTE ONLY): 21 min   Charges:   PT Evaluation $PT Eval Low Complexity: 1 Low PT Treatments $Therapeutic Activity: 8-22 mins        Gwenlyn Saran, PT, DPT 07/02/21, 4:16 PM   Christie Nottingham 07/02/2021, 4:14 PM

## 2021-07-02 NOTE — Evaluation (Signed)
Occupational Therapy Evaluation Patient Details Name: [name] [name] MRN: [id number] DOB: 19-Jun-1941 Today's Date: 07/02/2021    History of Present Illness [name] [name] is a 80 y.o. female with medical history significant for hypertension, chronic atrial fibrillation on Xarelto, DVT, seizure disorder and CVA who presented to the emergency room with acute onset of mechanical fall at home with subsequent severe left hip pain which has been constant and worsening with movement, L HIP CTT: no acute fx, Large subcutaneous hematoma within the left gluteal region.   Clinical Impression   [name] [name] was seen for OT evaluation this date. Prior to hospital admission, pt was MOD I for mobility using SPC or 4WW as needed. Pt states correct location as [place], does not answer questions for year or month. Pt inconsistently responds to questions/comments t/o session.   Pt currently requires MAX A for LBD seated EOB. MIN A face washing and tooth brushing seated EOB - assist for intermittent R lateral lean and unable to functionally use LUE. Initial standing trial unable to achieve full upright posture with MAX A, PT called in to room to assist. MOD A x2 + RW sit<>stand (assist for LUE mgmt and standing portion), unable to take steps. Pt would benefit from skilled OT to address noted impairments and functional limitations (see below for any additional details) in order to maximize safety and independence while minimizing falls risk and caregiver burden. Upon hospital discharge, recommend STR to maximize pt safety and return to PLOF.     Follow Up Recommendations  SNF    Equipment Recommendations  Other (comment) (defer to next venue of care)    Recommendations for Other Services       Precautions / Restrictions Precautions Precautions: Fall Restrictions Weight Bearing Restrictions: Yes LLE Weight Bearing: Weight bearing as tolerated      Mobility Bed Mobility Overal bed mobility:  Needs Assistance Bed Mobility: Sit to Supine;Supine to Sit     Supine to sit: Max assist Sit to supine: Max assist;+2 for physical assistance        Transfers Overall transfer level: Needs assistance Equipment used: Rolling walker (2 wheeled) Transfers: Sit to/from Stand Sit to Stand: Mod assist;+2 safety/equipment;From elevated surface              Balance Overall balance assessment: Needs assistance Sitting-balance support: No upper extremity supported;Feet supported Sitting balance-Leahy Scale: Fair     Standing balance support: Bilateral upper extremity supported Standing balance-Leahy Scale: Poor                             ADL either performed or assessed with clinical judgement   ADL Overall ADL's : Needs assistance/impaired                                       General ADL Comments: MAX A for LBD seated EOB. MIN A face washing and tooth brushing seated EOB = assist for intermittent R lateral lean and unable to functionally use LUE. MOD A x2 + RW sit<.stand, unable to take steps      Pertinent Vitals/Pain Pain Assessment: Faces Pain Score: 3  Faces Pain Scale: Hurts worst Pain Location: L hip and L shoulder Pain Descriptors / Indicators: Discomfort;Dull;Grimacing Pain Intervention(s): Repositioned;Limited activity within patient's tolerance;Ice applied     Hand Dominance Right   Extremity/Trunk Assessment Upper Extremity Assessment  Upper Extremity Assessment: LUE deficits/detail LUE Deficits / Details: initially refuses all LUE A/PROM, eventually demonstrates [facility] elbow flexion/extension, achieves ~30* shoulder flexion LUE: Unable to fully assess due to pain   Lower Extremity Assessment Lower Extremity Assessment: Generalized weakness       Communication Communication Communication: Other (comment) (inconsistentyl responds)   Cognition Arousal/Alertness: Awake/alert Behavior During Therapy: Flat affect Overall  Cognitive Status: No family/caregiver present to determine baseline cognitive functioning                                 General Comments: states location [place], does not answer questions for year or month. Inconsistently responds to all questions   General Comments       Exercises Exercises: Other exercises Other Exercises Other Exercises: Pt educated re: OT role, DME recs, d/c recs, falls prevention Other Exercises: LBD, sup<>sit, sit<>stnad, sitting/standing balance/tolerance   Shoulder Instructions      Home Living Family/patient expects to be discharged to:: Assisted living                             Home Equipment: Walker - 4 wheels;Cane - single point   Additional Comments: Pt unable or unwilling to answer all questions re home setup and PLOF - states lives in apartment facility where meals and housekeeping are provided. Initially states using SPC more frequently then states 4WW      Prior Functioning/Environment Level of Independence: Independent with assistive device(s)                 OT Problem List: Decreased strength;Decreased range of motion;Decreased activity tolerance;Impaired balance (sitting and/or standing);Decreased safety awareness;Impaired UE functional use      OT Treatment/Interventions: Self-care/ADL training;Therapeutic exercise;Energy conservation;DME and/or AE instruction;Therapeutic activities;Patient/family education;Balance training    OT Goals(Current goals can be found in the care plan section) Acute Rehab OT Goals Patient Stated Goal: to improve pain OT Goal Formulation: With patient Time For Goal Achievement: 07/16/21 Potential to Achieve Goals: Fair ADL Goals Pt Will Perform Grooming: with modified independence;sitting Pt Will Perform Lower Body Dressing: with min assist;sitting/lateral leans Pt Will Transfer to Toilet: with min assist;stand pivot transfer;bedside commode (c LRAD PRN)  OT Frequency:  Min 2X/week   Barriers to D/C: Decreased caregiver support          Co-evaluation              AM-PAC OT "6 Clicks" Daily Activity     Outcome Measure Help from another person eating meals?: A Little Help from another person taking care of personal grooming?: A Little Help from another person toileting, which includes using toliet, bedpan, or urinal?: A Lot Help from another person bathing (including washing, rinsing, drying)?: A Lot Help from another person to put on and taking off regular upper body clothing?: A Lot Help from another person to put on and taking off regular lower body clothing?: A Lot 6 Click Score: 14   End of Session Equipment Utilized During Treatment: Gait belt;Rolling walker Nurse Communication: Mobility status  Activity Tolerance: Patient tolerated treatment well Patient left: in bed;with call bell/phone within reach;with bed alarm set  OT Visit Diagnosis: Unsteadiness on feet (R26.81);Other abnormalities of gait and mobility (R26.89)                Time: [id number] OT Time Calculation (min): 36 min Charges:  OT General Charges $  OT Visit: 1 Visit OT Evaluation $OT Eval Low Complexity: 1 Low OT Treatments $Self Care/Home Management : 23-37 mins  Dessie Coma, M.S. OTR/L  07/02/21, 3:37 PM  ascom [phone]

## 2021-07-03 DIAGNOSIS — M25559 Pain in unspecified hip: Secondary | ICD-10-CM

## 2021-07-03 LAB — BASIC METABOLIC PANEL
Anion gap: 5 (ref 5–15)
BUN: 14 mg/dL (ref 8–23)
CO2: 24 mmol/L (ref 22–32)
Calcium: 8.3 mg/dL — ABNORMAL LOW (ref 8.9–10.3)
Chloride: 106 mmol/L (ref 98–111)
Creatinine, Ser: 0.63 mg/dL (ref 0.44–1.00)
GFR, Estimated: >60 mL/min (ref >60)
Glucose, Bld: 104 mg/dL — ABNORMAL HIGH (ref 70–99)
Potassium: 3.9 mmol/L (ref 3.5–5.1)
Sodium: 135 mmol/L (ref 135–145)

## 2021-07-03 LAB — CBC WITH DIFFERENTIAL/PLATELET
Abs Immature Granulocytes: 0.03 10*3/uL (ref 0.00–0.07)
Basophils Absolute: 0.1 10*3/uL (ref 0.0–0.1)
Basophils Relative: 1 %
Eosinophils Absolute: 0.1 10*3/uL (ref 0.0–0.5)
Eosinophils Relative: 2 %
HCT: 24.7 % — ABNORMAL LOW (ref 36.0–46.0)
Hemoglobin: 8.2 g/dL — ABNORMAL LOW (ref 12.0–15.0)
Immature Granulocytes: 0 %
Lymphocytes Relative: 23 %
Lymphs Abs: 1.7 10*3/uL (ref 0.7–4.0)
MCH: 34.5 pg — ABNORMAL HIGH (ref 26.0–34.0)
MCHC: 33.2 g/dL (ref 30.0–36.0)
MCV: 103.8 fL — ABNORMAL HIGH (ref 80.0–100.0)
Monocytes Absolute: 0.9 10*3/uL (ref 0.1–1.0)
Monocytes Relative: 12 %
Neutro Abs: 4.6 10*3/uL (ref 1.7–7.7)
Neutrophils Relative %: 62 %
Platelets: 146 10*3/uL — ABNORMAL LOW (ref 150–400)
RBC: 2.38 MIL/uL — ABNORMAL LOW (ref 3.87–5.11)
RDW: 14.4 % (ref 11.5–15.5)
WBC: 7.4 10*3/uL (ref 4.0–10.5)
nRBC: 0 % (ref 0.0–0.2)

## 2021-07-03 LAB — MAGNESIUM: Magnesium: 2.1 mg/dL (ref 1.7–2.4)

## 2021-07-03 MED ORDER — TRAMADOL HCL 50 MG PO TABS
50.0000 mg | ORAL_TABLET | Freq: Four times a day (QID) | ORAL | Status: DC | PRN
Start: 2021-07-03 — End: 2021-07-06
  Administered 2021-07-03 – 2021-07-06 (×6): 50 mg via ORAL
  Filled 2021-07-03 (×6): qty 1

## 2021-07-03 MED ORDER — MORPHINE SULFATE (PF) 2 MG/ML IV SOLN: 2.0000 mg | INTRAVENOUS | Status: DC | PRN

## 2021-07-03 NOTE — Plan of Care (Signed)
Problem: Education: Goal: Knowledge of General Education information will improve Description: Including pain rating scale, medication(s)/side effects and non-pharmacologic comfort measures Outcome: Progressing   Problem: Health Behavior/Discharge Planning: Goal: Ability to manage health-related needs will improve Outcome: Progressing   Problem: Clinical Measurements: Goal: Ability to maintain clinical measurements within normal limits will improve Outcome: Progressing Goal: Will remain free from infection Outcome: Progressing Goal: Diagnostic test results will improve Outcome: Progressing Goal: Respiratory complications will improve Outcome: Progressing Goal: Cardiovascular complication will be avoided Outcome: Progressing   Problem: Activity: Goal: Risk for activity intolerance will decrease Outcome: Progressing   Problem: Nutrition: Goal: Adequate nutrition will be maintained Outcome: Progressing   Problem: Coping: Goal: Level of anxiety will decrease Outcome: Progressing   Problem: Elimination: Goal: Will not experience complications related to bowel motility Outcome: Progressing Goal: Will not experience complications related to urinary retention Outcome: Progressing   Problem: Pain Managment: Goal: General experience of comfort will improve Outcome: Progressing   Problem: Safety: Goal: Ability to remain free from injury will improve Outcome: Progressing   Problem: Skin Integrity: Goal: Risk for impaired skin integrity will decrease Outcome: Progressing   Problem: Education: Goal: Knowledge of General Education information will improve Description: Including pain rating scale, medication(s)/side effects and non-pharmacologic comfort measures 07/03/2021 1129 by Elsie Ra, RN Outcome: Progressing 07/03/2021 1129 by Elsie Ra, RN Outcome: Progressing   Problem: Health Behavior/Discharge Planning: Goal: Ability to manage health-related  needs will improve 07/03/2021 1129 by Elsie Ra, RN Outcome: Progressing 07/03/2021 1129 by Elsie Ra, RN Outcome: Progressing   Problem: Clinical Measurements: Goal: Ability to maintain clinical measurements within normal limits will improve 07/03/2021 1129 by Glenn Christo, Debbe Mounts, RN Outcome: Progressing 07/03/2021 1129 by Elsie Ra, RN Outcome: Progressing Goal: Will remain free from infection 07/03/2021 1129 by Elsie Ra, RN Outcome: Progressing 07/03/2021 1129 by Elsie Ra, RN Outcome: Progressing Goal: Diagnostic test results will improve 07/03/2021 1129 by Elsie Ra, RN Outcome: Progressing 07/03/2021 1129 by Elsie Ra, RN Outcome: Progressing Goal: Respiratory complications will improve 07/03/2021 1129 by Elsie Ra, RN Outcome: Progressing 07/03/2021 1129 by Elsie Ra, RN Outcome: Progressing Goal: Cardiovascular complication will be avoided 07/03/2021 1129 by Elsie Ra, RN Outcome: Progressing 07/03/2021 1129 by Elsie Ra, RN Outcome: Progressing   Problem: Activity: Goal: Risk for activity intolerance will decrease 07/03/2021 1129 by Elsie Ra, RN Outcome: Progressing 07/03/2021 1129 by Elsie Ra, RN Outcome: Progressing   Problem: Nutrition: Goal: Adequate nutrition will be maintained 07/03/2021 1129 by Elsie Ra, RN Outcome: Progressing 07/03/2021 1129 by Elsie Ra, RN Outcome: Progressing   Problem: Coping: Goal: Level of anxiety will decrease 07/03/2021 1129 by Elsie Ra, RN Outcome: Progressing 07/03/2021 1129 by Elsie Ra, RN Outcome: Progressing   Problem: Elimination: Goal: Will not experience complications related to bowel motility 07/03/2021 1129 by Elsie Ra, RN Outcome: Progressing 07/03/2021 1129 by Elsie Ra, RN Outcome: Progressing Goal: Will not experience complications related to  urinary retention 07/03/2021 1129 by Elsie Ra, RN Outcome: Progressing 07/03/2021 1129 by Elsie Ra, RN Outcome: Progressing   Problem: Pain Managment: Goal: General experience of comfort will improve 07/03/2021 1129 by Elsie Ra, RN Outcome: Progressing 07/03/2021 1129 by Elsie Ra, RN Outcome: Progressing   Problem: Safety: Goal: Ability to remain free from injury will improve 07/03/2021 1129 by Elsie Ra, RN Outcome: Progressing 07/03/2021 1129 by Elsie Ra,  RN Outcome: Progressing   Problem: Skin Integrity: Goal: Risk for impaired skin integrity will decrease 07/03/2021 1129 by Elsie Ra, RN Outcome: Progressing 07/03/2021 1129 by Elsie Ra, RN Outcome: Progressing

## 2021-07-03 NOTE — Progress Notes (Signed)
Patient ID: [name] [name], female   DOB: 03/18/1941, 80 y.o.   MRN: [id number]  PROGRESS NOTE    Jaydeen Tocco Chaput  [id number] DOB: 03/12/1941 DOA: 07/01/2021 PCP: Birdie Sons, MD   Brief Narrative:  80 y.o. female with medical history significant for hypertension, chronic atrial fibrillation on Xarelto, DVT, seizure disorder and CVA presented with fall and left hip pain.  On presentation, hemoglobin was 10.8 (hemoglobin of 12 on 06/14/2021) with INR of 1.7.  Hip x-ray showed no acute fracture or dislocation.  CT of the head showed no acute intracranial abnormalities. Left hip CT showed large subcutaneous hematoma within the left gluteal region with no acute displaced fractures.  Orthopedics was consulted.  Assessment & Plan:   Large left gluteal subcutaneous hematoma after a mechanical fall -Orthopedics following and currently recommending conservative medical management but would need surgical intervention if hematoma worsens or becomes more fluctuant.  Fall precautions.   -PT recommends SNF placement.  Social worker consult.  - Xarelto on hold.  Monitor H&H.  Hemoglobin 8.2 today.  Transfuse if hemoglobin is less than 7.  Chronic atrial fibrillation -Currently mildly tachycardic.  Blood pressure improving; restart Cardizem CD.  Xarelto on hold  Hypertension -Blood pressure improving.  Toprol-XL and Cardizem CD on hold for now.  Restart Cardizem CD.  Overactive bladder -Continue Ditropan  DVT prophylaxis: SCDs Code Status: DNR Family Communication: None at bedside Disposition Plan: Status is: Inpatient because: Inpatient level of care appropriate due to severity of illness  Dispo: The patient is from: Home              Anticipated d/c is to: SNF              Patient currently is not medically stable to d/c.   Difficult to place patient No  Consultants: Orthopedics  Procedures: None  Antimicrobials: None   Subjective: Patient seen and examined at bedside.   Poor historian.  Complains of some hip pain.  No vomiting, fever, worsening shortness of breath reported. Objective: Vitals:   07/02/21 1955 07/02/21 2330 07/03/21 0332 07/03/21 0500  BP: 113/67 132/64 122/65   Pulse: (!) 109 (!) 106 (!) 106   Resp: '18 17 19   '$ Temp: 99.3 F (37.4 C) 98.2 F (36.8 C) 99.8 F (37.7 C)   TempSrc:      SpO2: 94% 94% 91%   Weight:    80.8 kg  Height:        Intake/Output Summary (Last 24 hours) at 07/03/2021 0800 Last data filed at 07/02/2021 1037 Gross per 24 hour  Intake 240 ml  Output --  Net 240 ml    Filed Weights   07/01/21 1913 07/02/21 0127 07/03/21 0500  Weight: 71.7 kg 75.6 kg 80.8 kg    Examination:  General exam: No distress.  On room air currently.  Looks chronically ill and deconditioned.   Respiratory system: Bilateral decreased breath sounds at bases with some scattered crackles  cardiovascular system: Tachycardic; S1-S2 heard  gastrointestinal system: Abdomen is distended slightly, soft and nontender.  Bowel sounds are heard  extremities: Mild left hip tenderness present; trace lower extremity edema present.  No clubbing  Central nervous system: Still extremely slow to respond.  Poor historian.  No focal neurological deficits.  Moves extremities Skin: Left lateral hip ecchymosis present.  No other rashes or lesions psychiatry: Affect is extremely flat.  Does not participate in conversation much.  Data Reviewed: I have personally reviewed following labs and imaging  studies  CBC: Recent Labs  Lab 07/01/21 2114 07/01/21 2340 07/02/21 0529 07/02/21 1124 07/02/21 1719 07/03/21 0414  WBC 11.1*  --  8.1  --   --  7.4  NEUTROABS 8.6*  --   --   --   --  4.6  HGB 10.8* 9.8* 9.1* 9.0* 9.0* 8.2*  HCT 31.7* 29.1* 27.7* 28.1* 27.2* 24.7*  MCV 101.3*  --  102.6*  --   --  103.8*  PLT 185  --  159  --   --  146*    Basic Metabolic Panel: Recent Labs  Lab 07/01/21 2114 07/02/21 0529 07/03/21 0414  NA 135 136 135  K 4.1 4.1  3.9  CL 104 107 106  CO2 '23 23 24  '$ GLUCOSE 138* 121* 104*  BUN '20 19 14  '$ CREATININE 0.69 0.54 0.63  CALCIUM 9.6 8.8* 8.3*  MG  --   --  2.1    GFR: Estimated Creatinine Clearance: 53.7 mL/min (by C-G formula based on SCr of 0.63 mg/dL). Liver Function Tests: No results for input(s): AST, ALT, ALKPHOS, BILITOT, PROT, ALBUMIN in the last 168 hours. No results for input(s): LIPASE, AMYLASE in the last 168 hours. No results for input(s): AMMONIA in the last 168 hours. Coagulation Profile: Recent Labs  Lab 07/01/21 2114  INR 1.7*    Cardiac Enzymes: No results for input(s): CKTOTAL, CKMB, CKMBINDEX, TROPONINI in the last 168 hours. BNP (last 3 results) No results for input(s): PROBNP in the last 8760 hours. HbA1C: No results for input(s): HGBA1C in the last 72 hours. CBG: No results for input(s): GLUCAP in the last 168 hours. Lipid Profile: No results for input(s): CHOL, HDL, LDLCALC, TRIG, CHOLHDL, LDLDIRECT in the last 72 hours. Thyroid Function Tests: No results for input(s): TSH, T4TOTAL, FREET4, T3FREE, THYROIDAB in the last 72 hours. Anemia Panel: No results for input(s): VITAMINB12, FOLATE, FERRITIN, TIBC, IRON, RETICCTPCT in the last 72 hours. Sepsis Labs: No results for input(s): PROCALCITON, LATICACIDVEN in the last 168 hours.  Recent Results (from the past 240 hour(s))  Resp Panel by RT-PCR (Flu A&B, Covid) Nasopharyngeal Swab     Status: None   Collection Time: 07/01/21 11:40 PM   Specimen: Nasopharyngeal Swab; Nasopharyngeal(NP) swabs in vial transport medium  Result Value Ref Range Status   SARS Coronavirus 2 by RT PCR NEGATIVE NEGATIVE Final    Comment: (NOTE) SARS-CoV-2 target nucleic acids are NOT DETECTED.  The SARS-CoV-2 RNA is generally detectable in upper respiratory specimens during the acute phase of infection. The lowest concentration of SARS-CoV-2 viral copies this assay can detect is 138 copies/mL. A negative result does not preclude  SARS-Cov-2 infection and should not be used as the sole basis for treatment or other patient management decisions. A negative result may occur with  improper specimen collection/handling, submission of specimen other than nasopharyngeal swab, presence of viral mutation(s) within the areas targeted by this assay, and inadequate number of viral copies(<138 copies/mL). A negative result must be combined with clinical observations, patient history, and epidemiological information. The expected result is Negative.  Fact Sheet for Patients:  EntrepreneurPulse.com.au  Fact Sheet for Healthcare Providers:  IncredibleEmployment.be  This test is no t yet approved or cleared by the [place] FDA and  has been authorized for detection and/or diagnosis of SARS-CoV-2 by FDA under an Emergency Use Authorization (EUA). This EUA will remain  in effect (meaning this test can be used) for the duration of the COVID-19 declaration under Section 564(b)(1) of the  Act, 21 U.S.C.section 360bbb-3(b)(1), unless the authorization is terminated  or revoked sooner.       Influenza A by PCR NEGATIVE NEGATIVE Final   Influenza B by PCR NEGATIVE NEGATIVE Final    Comment: (NOTE) The Xpert Xpress SARS-CoV-2/FLU/RSV plus assay is intended as an aid in the diagnosis of influenza from Nasopharyngeal swab specimens and should not be used as a sole basis for treatment. Nasal washings and aspirates are unacceptable for Xpert Xpress SARS-CoV-2/FLU/RSV testing.  Fact Sheet for Patients: EntrepreneurPulse.com.au  Fact Sheet for Healthcare Providers: IncredibleEmployment.be  This test is not yet approved or cleared by the [place] FDA and has been authorized for detection and/or diagnosis of SARS-CoV-2 by FDA under an Emergency Use Authorization (EUA). This EUA will remain in effect (meaning this test can be used) for the duration of  the COVID-19 declaration under Section 564(b)(1) of the Act, 21 U.S.C. section 360bbb-3(b)(1), unless the authorization is terminated or revoked.  Performed at [facility], [address] [place], [place] [address]           Radiology Studies: CT HIP LEFT W CONTRAST  Result Date: 07/01/2021 CLINICAL DATA:  Tripped and fell, left hip pain EXAM: CT OF THE LOWER LEFT EXTREMITY WITH CONTRAST TECHNIQUE: Multidetector CT imaging of the lower left extremity was performed according to the standard protocol following intravenous contrast administration. CONTRAST:  114m OMNIPAQUE IOHEXOL 350 MG/ML SOLN COMPARISON:  07/01/2021 FINDINGS: Bones/Joint/Cartilage There are no acute displaced fractures. Mild joint space narrowing of the left hip compatible with osteoarthritis. Prominent spondylosis and facet hypertrophy at the lumbosacral junction. Ligaments Suboptimally assessed by CT. Muscles and Tendons No gross abnormalities. Soft tissues There is a large hematoma in the subcutaneous tissues in the left gluteal region, measuring up to 13.3 x 6.1 cm in transverse dimension and extending approximately 18.1 cm in craniocaudal extent. The visualized intrapelvic structures are unremarkable. Scattered diverticulosis of the distal colon is noted without diverticulitis. Vascular structures enhance normally. Reconstructed images demonstrate no additional findings. IMPRESSION: 1. Large subcutaneous hematoma within the left gluteal region. 2. No acute displaced fractures. Electronically Signed   By: MRanda NgoM.D.   On: 07/01/2021 22:15   DG Shoulder Left  Result Date: 07/02/2021 CLINICAL DATA:  Intermittent left shoulder pain, recent fall EXAM: LEFT SHOULDER - 2+ VIEW COMPARISON:  03/09/2021 FINDINGS: Internal rotation, external rotation, and transscapular views of the left shoulder are obtained. No fracture, subluxation, or dislocation. Mild osteoarthritis of the acromioclavicular and glenohumeral  joints. Visualized portions of the left chest are clear. IMPRESSION: 1. Mild osteoarthritis.  No acute bony abnormality. Electronically Signed   By: MRanda NgoM.D.   On: 07/02/2021 17:23   DG Hip Unilat With Pelvis 2-3 Views Left  Result Date: 07/01/2021 CLINICAL DATA:  Fall and left hip pain. EXAM: DG HIP (WITH OR WITHOUT PELVIS) 2-3V LEFT COMPARISON:  None. FINDINGS: There is no acute fracture or dislocation. The bones are osteopenic. Mild bilateral hip and SI joint arthritic changes. There is soft tissue contusion the lateral left hip. IMPRESSION: No acute fracture or dislocation. Electronically Signed   By: AAnner CreteM.D.   On: 07/01/2021 20:15        Scheduled Meds:  cholecalciferol  1,000 Units Oral Daily   diltiazem  240 mg Oral Daily   gabapentin  300 mg Oral BID   lidocaine  1 patch Transdermal Q24H   metoprolol succinate  12.5 mg Oral Daily   omega-3 acid ethyl esters  1 g Oral Daily   oxybutynin  5 mg Oral BID   zinc sulfate  220 mg Oral Daily   Continuous Infusions:  sodium chloride 50 mL/hr at 07/03/21 0431          Aline August, MD [facility] 07/03/2021, 8:00 AM

## 2021-07-03 NOTE — Progress Notes (Signed)
  Subjective:   L hip subcutaneous hematoma Patient reports pain as  improved  .   Patient is well, and has had no acute complaints or problems   Objective: Vital signs in last 24 hours: Temp:  [98.2 F (36.8 C)-99.8 F (37.7 C)] 98.7 F (37.1 C) (09/11 0809) Pulse Rate:  [89-109] 89 (09/11 0809) Resp:  [15-19] 15 (09/11 0809) BP: (104-136)/(49-67) 136/51 (09/11 0809) SpO2:  [91 %-99 %] 95 % (09/11 0809) Weight:  [80.8 kg] 80.8 kg (09/11 0500)  Intake/Output from previous day: No intake or output data in the 24 hours ending 07/03/21 1110  Intake/Output this shift: No intake/output data recorded.  Labs: Recent Labs    07/01/21 2340 07/02/21 0529 07/02/21 1124 07/02/21 1719 07/03/21 0414  HGB 9.8* 9.1* 9.0* 9.0* 8.2*   Recent Labs    07/02/21 0529 07/02/21 1124 07/02/21 1719 07/03/21 0414  WBC 8.1  --   --  7.4  RBC 2.70*  --   --  2.38*  HCT 27.7*   < > 27.2* 24.7*  PLT 159  --   --  146*   < > = values in this interval not displayed.   Recent Labs    07/02/21 0529 07/03/21 0414  NA 136 135  K 4.1 3.9  CL 107 106  CO2 23 24  BUN 19 14  CREATININE 0.54 0.63  GLUCOSE 121* 104*  CALCIUM 8.8* 8.3*   Recent Labs    07/01/21 2114  INR 1.7*     EXAM General - Patient is Alert, Appropriate, and Oriented Extremity -  large hematoma noted over the lateral thigh and extending up into the gluteal region, no significant fluctuance noted      Active Problems:   Fall   Hip pain  Estimated body mass index is 34.79 kg/m as calculated from the following:   Height as of this encounter: 5' (1.524 m).   Weight as of this encounter: 80.8 kg. Advance diet Up with therapy  Encouraged patient to continue to lie on L side and apply ice packs. Patient understands.      DVT Prophylaxis - holding Xarelto at this time  Weight-Bearing as tolerated to left leg  Cassell Smiles, PA-C [facility] 07/03/2021, 11:10 AM

## 2021-07-04 ENCOUNTER — Inpatient Hospital Stay: Payer: Medicare Other

## 2021-07-04 DIAGNOSIS — S40012A Contusion of left shoulder, initial encounter: Secondary | ICD-10-CM | POA: Diagnosis not present

## 2021-07-04 DIAGNOSIS — M19012 Primary osteoarthritis, left shoulder: Secondary | ICD-10-CM | POA: Diagnosis not present

## 2021-07-04 LAB — CBC WITH DIFFERENTIAL/PLATELET
Abs Immature Granulocytes: 0.03 10*3/uL (ref 0.00–0.07)
Basophils Absolute: 0.1 10*3/uL (ref 0.0–0.1)
Basophils Relative: 1 %
Eosinophils Absolute: 0.3 10*3/uL (ref 0.0–0.5)
Eosinophils Relative: 4 %
HCT: 24.8 % — ABNORMAL LOW (ref 36.0–46.0)
Hemoglobin: 8.2 g/dL — ABNORMAL LOW (ref 12.0–15.0)
Immature Granulocytes: 0 %
Lymphocytes Relative: 25 %
Lymphs Abs: 1.8 10*3/uL (ref 0.7–4.0)
MCH: 34.5 pg — ABNORMAL HIGH (ref 26.0–34.0)
MCHC: 33.1 g/dL (ref 30.0–36.0)
MCV: 104.2 fL — ABNORMAL HIGH (ref 80.0–100.0)
Monocytes Absolute: 0.8 10*3/uL (ref 0.1–1.0)
Monocytes Relative: 11 %
Neutro Abs: 4.2 10*3/uL (ref 1.7–7.7)
Neutrophils Relative %: 59 %
Platelets: 140 10*3/uL — ABNORMAL LOW (ref 150–400)
RBC: 2.38 MIL/uL — ABNORMAL LOW (ref 3.87–5.11)
RDW: 14.3 % (ref 11.5–15.5)
WBC: 7.3 10*3/uL (ref 4.0–10.5)
nRBC: 0 % (ref 0.0–0.2)

## 2021-07-04 LAB — BASIC METABOLIC PANEL
Anion gap: 5 (ref 5–15)
BUN: 19 mg/dL (ref 8–23)
CO2: 25 mmol/L (ref 22–32)
Calcium: 8.8 mg/dL — ABNORMAL LOW (ref 8.9–10.3)
Chloride: 107 mmol/L (ref 98–111)
Creatinine, Ser: 0.55 mg/dL (ref 0.44–1.00)
GFR, Estimated: >60 mL/min (ref >60)
Glucose, Bld: 99 mg/dL (ref 70–99)
Potassium: 3.9 mmol/L (ref 3.5–5.1)
Sodium: 137 mmol/L (ref 135–145)

## 2021-07-04 LAB — MAGNESIUM: Magnesium: 2.2 mg/dL (ref 1.7–2.4)

## 2021-07-04 MED ORDER — BUPIVACAINE HCL (PF) 0.5 % IJ SOLN
10.0000 mL | Freq: Once | INTRAMUSCULAR | Status: AC
  Administered 2021-07-04: 10 mL
  Filled 2021-07-04: qty 10

## 2021-07-04 MED ORDER — TRIAMCINOLONE ACETONIDE 40 MG/ML IJ SUSP
40.0000 mg | Freq: Once | INTRAMUSCULAR | Status: AC
  Administered 2021-07-04: 40 mg via INTRA_ARTICULAR
  Filled 2021-07-04 (×4): qty 1

## 2021-07-04 NOTE — NC FL2 (Signed)
[place] LEVEL OF CARE SCREENING TOOL     IDENTIFICATION  Patient Name: [name] [name] Birthdate: 01-27-1941 Sex: female Admission Date (Current Location): 07/01/2021  [facility] and [place] Number:  Engineering geologist and Address:  [facility], [address] [place], [place] [address]      Provider Number: [id number]  Attending Physician Name and Address:  Aline August, MD  Relative Name and Phone Number:  [name] Daughter [phone]    Current Level of Care: Hospital Recommended Level of Care: [place] Prior Approval Number:    Date Approved/Denied:   PASRR Number: [id number]  Discharge Plan:      Current Diagnoses: Patient Active Problem List   Diagnosis Date Noted   Hip pain 07/02/2021   Fall 07/01/2021   Spinal stenosis of lumbosacral region 09/11/2019   Expressive aphasia 03/11/2019   Irritable bladder 09/12/2018   Lumbar radiculopathy 05/13/2018   Chronic pain syndrome 05/13/2018   Lumbar degenerative disc disease 05/13/2018   Osteoporosis 08/02/2016   History of colon polyps 08/02/2016   Low back pain 06/20/2016   S/P total knee arthroplasty 03/01/2016   Knee osteoarthritis 02/16/2016   Weakness 09/27/2015   Balloon like swelling of an artery of the brain 02/12/2015   History of CVA (cerebrovascular accident) 02/16/2014   Seizures ([place]) 02/16/2014   Chronic leg pain 02/16/2014   Fatigue 07/02/2013   HYPERTENSION, BENIGN 12/07/2010   Atrial fibrillation, chronic 06/29/2009    Orientation RESPIRATION BLADDER Height & Weight     Self, Place, Situation  Normal Continent Weight: 80.8 kg Height:  5' (152.4 cm)  BEHAVIORAL SYMPTOMS/MOOD NEUROLOGICAL BOWEL NUTRITION STATUS      Continent Diet (heart healthy)  AMBULATORY STATUS COMMUNICATION OF NEEDS Skin   Extensive Assist Verbally Normal                       Personal Care Assistance Level of Assistance  Bathing, Feeding,  Dressing, Total care Bathing Assistance: Limited assistance Feeding assistance: Limited assistance Dressing Assistance: Maximum assistance Total Care Assistance: Limited assistance   Functional Limitations Info             SPECIAL CARE FACTORS FREQUENCY  PT (By licensed PT), OT (By licensed OT)     PT Frequency: 5 times per week OT Frequency: 5 times per week            Contractures Contractures Info: Not present    Additional Factors Info  Code Status, Allergies Code Status Info: DNR Allergies Info: Aspirin           Current Medications (07/04/2021):  This is the current hospital active medication list Current Facility-Administered Medications  Medication Dose Route Frequency Provider Last Rate Last Admin   acetaminophen (TYLENOL) tablet 650 mg  650 mg Oral Q6H PRN Mansy, Jan A, MD   650 mg at 07/03/21 2228   Or   acetaminophen (TYLENOL) suppository 650 mg  650 mg Rectal Q6H PRN Mansy, Jan A, MD       bisacodyl (DULCOLAX) EC tablet 5 mg  5 mg Oral Daily PRN Mansy, Jan A, MD       cholecalciferol (VITAMIN D3) tablet 1,000 Units  1,000 Units Oral Daily Mansy, Jan A, MD   1,000 Units at 07/04/21 1029   diltiazem (CARDIZEM CD) 24 hr capsule 240 mg  240 mg Oral Daily Mansy, Jan A, MD   240 mg at 07/04/21 1029   gabapentin (NEURONTIN) capsule 300  mg  300 mg Oral BID Mansy, Jan A, MD   300 mg at 07/04/21 1029   lidocaine (LIDODERM) 5 % 1 patch  1 patch Transdermal Q24H Mansy, Jan A, MD   1 patch at 07/03/21 0903   magnesium hydroxide (MILK OF MAGNESIA) suspension 30 mL  30 mL Oral Daily PRN Mansy, Jan A, MD       metoprolol succinate (TOPROL-XL) 24 hr tablet 12.5 mg  12.5 mg Oral Daily Mansy, Jan A, MD   12.5 mg at 07/04/21 1029   morphine 2 MG/ML injection 2 mg  2 mg Intravenous Q2H PRN Aline August, MD       omega-3 acid ethyl esters (LOVAZA) capsule 1 g  1 g Oral Daily Mansy, Jan A, MD   1 g at 07/04/21 1029   ondansetron (ZOFRAN) tablet 4 mg  4 mg Oral Q6H PRN Mansy,  Jan A, MD       Or   ondansetron [facility]) injection 4 mg  4 mg Intravenous Q6H PRN Mansy, Jan A, MD       oxybutynin (DITROPAN) tablet 5 mg  5 mg Oral BID Mansy, Jan A, MD   5 mg at 07/04/21 1030   polyvinyl alcohol (LIQUIFILM TEARS) 1.4 % ophthalmic solution 1 drop  1 drop Both Eyes PRN Mansy, Jan A, MD       traMADol (ULTRAM) tablet 50 mg  50 mg Oral Q6H PRN Aline August, MD   50 mg at 07/04/21 0857   traZODone (DESYREL) tablet 25 mg  25 mg Oral QHS PRN Mansy, Jan A, MD       zinc sulfate capsule 220 mg  220 mg Oral Daily Mansy, Jan A, MD   220 mg at 07/04/21 1030     Discharge Medications: Please see discharge summary for a list of discharge medications.  Relevant Imaging Results:  Relevant Lab Results:   Additional Information SSN: [id number]  Raykwon Hobbs Jen Mow, RN

## 2021-07-04 NOTE — Plan of Care (Signed)
No acute events this shift. °Problem: Education: °Goal: Knowledge of General Education information will improve °Description: Including pain rating scale, medication(s)/side effects and non-pharmacologic comfort measures °Outcome: Progressing °  °Problem: Health Behavior/Discharge Planning: °Goal: Ability to manage health-related needs will improve °Outcome: Progressing °  °Problem: Clinical Measurements: °Goal: Ability to maintain clinical measurements within normal limits will improve °Outcome: Progressing °Goal: Will remain free from infection °Outcome: Progressing °Goal: Diagnostic test results will improve °Outcome: Progressing °Goal: Respiratory complications will improve °Outcome: Progressing °Goal: Cardiovascular complication will be avoided °Outcome: Progressing °  °Problem: Activity: °Goal: Risk for activity intolerance will decrease °Outcome: Progressing °  °Problem: Nutrition: °Goal: Adequate nutrition will be maintained °Outcome: Progressing °  °Problem: Coping: °Goal: Level of anxiety will decrease °Outcome: Progressing °  °Problem: Elimination: °Goal: Will not experience complications related to bowel motility °Outcome: Progressing °Goal: Will not experience complications related to urinary retention °Outcome: Progressing °  °Problem: Pain Managment: °Goal: General experience of comfort will improve °Outcome: Progressing °  °Problem: Safety: °Goal: Ability to remain free from injury will improve °Outcome: Progressing °  °Problem: Skin Integrity: °Goal: Risk for impaired skin integrity will decrease °Outcome: Progressing °  °

## 2021-07-04 NOTE — Progress Notes (Signed)
PT Cancellation Note  Patient Details Name: [name] [name] MRN: [id number] DOB: [birth year]   Cancelled Treatment:    Reason Eval/Treat Not Completed: Patient declined, no reason specified. Pt refusing PT at this time. Pt expecting long distance phone call from family member. Requesting PT to return. PT will re-attempt at alter time/date.   Salem Caster. Fairly IV, PT, DPT Physical Therapist- [facility]  07/04/2021, 10:23 AM

## 2021-07-04 NOTE — TOC Progression Note (Signed)
Transition of Care [facility]) - Progression Note    Patient Details  Name: MEIA EMLEY MRN: [id number] Date of Birth: 31-Aug-1941  [facility] CM/SW Contact  Su Hilt, RN Phone Number: 07/04/2021, 11:03 AM  Clinical Narrative:     Met with the patient and her daughter [name] in the room to discuss DC plan and needs The daughter was concerned about cognitive issues and the fact that the patient is unable to use her left arm, She stated that the patient had a previous rotator cuff tear, Dr Rudene Christians has seen the patient previously and has been made aware of the shoulder,  I made Dr Starla Link aware of the cognitive issues and a CVA work up will be done,  The patient has spent some time at [facility] in the past for Stroke, they are interested in going to [facility] SNF and agree to a bed search, she has had 2 covid vaccines and refuses to get the booster A bedsearch was sent, PASSR obtained and FL2 completed, awaiting bed offers      Expected Discharge Plan and Services                                                 Social Determinants of Health (SDOH) Interventions    Readmission Risk Interventions Readmission Risk Prevention Plan 03/12/2019  Transportation Screening Complete  Some recent data might be hidden

## 2021-07-04 NOTE — TOC Progression Note (Signed)
Transition of Care [facility]) - Progression Note    Patient Details  Name: [name] [name] MRN: [id number] Date of Birth: September 09, 1941  Transition of Care [facility]) CM/SW Contact  Su Hilt, RN Phone Number: 07/04/2021, 4:57 PM  Clinical Narrative:     Left a message for the daughter to call back to review bed offers       Expected Discharge Plan and Services                                                 Social Determinants of Health (SDOH) Interventions    Readmission Risk Interventions Readmission Risk Prevention Plan 03/12/2019  Transportation Screening Complete  Some recent data might be hidden

## 2021-07-04 NOTE — Progress Notes (Addendum)
Patient ID: [name] [name], female   DOB: [birth year], 80 y.o.   MRN: [id number]  PROGRESS NOTE    [name] [name]  [id number] DOB: 23-Oct-1941 DOA: 07/01/2021 PCP: Birdie Sons, MD   Brief Narrative:  80 y.o. female with medical history significant for hypertension, chronic atrial fibrillation on Xarelto, DVT, seizure disorder and CVA presented with fall and left hip pain.  On presentation, hemoglobin was 10.8 (hemoglobin of 12 on 06/14/2021) with INR of 1.7.  Hip x-ray showed no acute fracture or dislocation.  CT of the head showed no acute intracranial abnormalities. Left hip CT showed large subcutaneous hematoma within the left gluteal region with no acute displaced fractures.  Orthopedics was consulted.  Assessment & Plan:   Large left gluteal subcutaneous hematoma after a mechanical fall -Orthopedics following and currently recommending conservative medical management but would need surgical intervention if hematoma worsens or becomes more fluctuant.  Fall precautions.   -PT recommends SNF placement.  Social worker consult.  - Xarelto on hold.  Monitor H&H.  Hemoglobin 8.2 today as well.  Transfuse if hemoglobin is less than 7.  Chronic atrial fibrillation -Currently rate controlled.  Continue Cardizem CD and Toprol-XL.  Xarelto on hold.  Thrombocytopenia -Questionable cause.  Monitor.  Mild leukocytosis -Resolved  Hypertension -Blood pressure improving.  Toprol-XL and Cardizem CD have been resumed.  Overactive bladder -Continue Ditropan  DVT prophylaxis: SCDs Code Status: DNR Family Communication: Called daughter on phone on 07/04/2021: She did not pick up Disposition Plan: Status is: Inpatient because: Inpatient level of care appropriate due to severity of illness  Dispo: The patient is from: Home              Anticipated d/c is to: SNF              Patient currently is not medically stable to d/c.   Difficult to place patient No  Consultants:  Orthopedics  Procedures: None  Antimicrobials: None   Subjective: Patient seen and examined at bedside.  Poor historian.  No fever, worsening shortness breath, chest pain or vomiting reported.  Complains of intermittent hip pain. Objective: Vitals:   07/03/21 2009 07/04/21 0439 07/04/21 0500 07/04/21 0739  BP: 117/68 (!) 95/49  (!) 115/46  Pulse: 78 85  85  Resp: '16 17  15  '$ Temp: 98.4 F (36.9 C) 98.3 F (36.8 C)  98.4 F (36.9 C)  TempSrc:      SpO2: 100% 94%  99%  Weight:   80.8 kg   Height:       No intake or output data in the 24 hours ending 07/04/21 0750  Filed Weights   07/02/21 0127 07/03/21 0500 07/04/21 0500  Weight: 75.6 kg 80.8 kg 80.8 kg    Examination:  General exam: On room air currently.  No acute distress.  Looks chronically ill and deconditioned.  Slow to respond.  Poor historian. Respiratory system: Decreased breath sounds at bases bilaterally with some crackles cardiovascular system: S1-S2 heard; currently rate controlled gastrointestinal system: Abdomen is mildly distended, soft and nontender.  Normal bowel sounds heard extremities: Mild left hip tenderness present; no cyanosis.  Mild lower extremity edema present    Data Reviewed: I have personally reviewed following labs and imaging studies  CBC: Recent Labs  Lab 07/01/21 2114 07/01/21 2340 07/02/21 0529 07/02/21 1124 07/02/21 1719 07/03/21 0414 07/04/21 0640  WBC 11.1*  --  8.1  --   --  7.4 7.3  NEUTROABS 8.6*  --   --   --   --  4.6 4.2  HGB 10.8*   < > 9.1* 9.0* 9.0* 8.2* 8.2*  HCT 31.7*   < > 27.7* 28.1* 27.2* 24.7* 24.8*  MCV 101.3*  --  102.6*  --   --  103.8* 104.2*  PLT 185  --  159  --   --  146* 140*   < > = values in this interval not displayed.    Basic Metabolic Panel: Recent Labs  Lab 07/01/21 2114 07/02/21 0529 07/03/21 0414 07/04/21 0640  NA 135 136 135 137  K 4.1 4.1 3.9 3.9  CL 104 107 106 107  CO2 '23 23 24 25  '$ GLUCOSE 138* 121* 104* 99  BUN '20 19 14 19   '$ CREATININE 0.69 0.54 0.63 0.55  CALCIUM 9.6 8.8* 8.3* 8.8*  MG  --   --  2.1 2.2    GFR: Estimated Creatinine Clearance: 53.7 mL/min (by C-G formula based on SCr of 0.55 mg/dL). Liver Function Tests: No results for input(s): AST, ALT, ALKPHOS, BILITOT, PROT, ALBUMIN in the last 168 hours. No results for input(s): LIPASE, AMYLASE in the last 168 hours. No results for input(s): AMMONIA in the last 168 hours. Coagulation Profile: Recent Labs  Lab 07/01/21 2114  INR 1.7*    Cardiac Enzymes: No results for input(s): CKTOTAL, CKMB, CKMBINDEX, TROPONINI in the last 168 hours. BNP (last 3 results) No results for input(s): PROBNP in the last 8760 hours. HbA1C: No results for input(s): HGBA1C in the last 72 hours. CBG: No results for input(s): GLUCAP in the last 168 hours. Lipid Profile: No results for input(s): CHOL, HDL, LDLCALC, TRIG, CHOLHDL, LDLDIRECT in the last 72 hours. Thyroid Function Tests: No results for input(s): TSH, T4TOTAL, FREET4, T3FREE, THYROIDAB in the last 72 hours. Anemia Panel: No results for input(s): VITAMINB12, FOLATE, FERRITIN, TIBC, IRON, RETICCTPCT in the last 72 hours. Sepsis Labs: No results for input(s): PROCALCITON, LATICACIDVEN in the last 168 hours.  Recent Results (from the past 240 hour(s))  Resp Panel by RT-PCR (Flu A&B, Covid) Nasopharyngeal Swab     Status: None   Collection Time: 07/01/21 11:40 PM   Specimen: Nasopharyngeal Swab; Nasopharyngeal(NP) swabs in vial transport medium  Result Value Ref Range Status   SARS Coronavirus 2 by RT PCR NEGATIVE NEGATIVE Final    Comment: (NOTE) SARS-CoV-2 target nucleic acids are NOT DETECTED.  The SARS-CoV-2 RNA is generally detectable in upper respiratory specimens during the acute phase of infection. The lowest concentration of SARS-CoV-2 viral copies this assay can detect is 138 copies/mL. A negative result does not preclude SARS-Cov-2 infection and should not be used as the sole basis for  treatment or other patient management decisions. A negative result may occur with  improper specimen collection/handling, submission of specimen other than nasopharyngeal swab, presence of viral mutation(s) within the areas targeted by this assay, and inadequate number of viral copies(<138 copies/mL). A negative result must be combined with clinical observations, patient history, and epidemiological information. The expected result is Negative.  Fact Sheet for Patients:  EntrepreneurPulse.com.au  Fact Sheet for Healthcare Providers:  IncredibleEmployment.be  This test is no t yet approved or cleared by the [place] FDA and  has been authorized for detection and/or diagnosis of SARS-CoV-2 by FDA under an Emergency Use Authorization (EUA). This EUA will remain  in effect (meaning this test can be used) for the duration of the COVID-19 declaration under Section 564(b)(1) of the Act, 21 U.S.C.section 360bbb-3(b)(1), unless the authorization is terminated  or revoked sooner.  Influenza A by PCR NEGATIVE NEGATIVE Final   Influenza B by PCR NEGATIVE NEGATIVE Final    Comment: (NOTE) The Xpert Xpress SARS-CoV-2/FLU/RSV plus assay is intended as an aid in the diagnosis of influenza from Nasopharyngeal swab specimens and should not be used as a sole basis for treatment. Nasal washings and aspirates are unacceptable for Xpert Xpress SARS-CoV-2/FLU/RSV testing.  Fact Sheet for Patients: EntrepreneurPulse.com.au  Fact Sheet for Healthcare Providers: IncredibleEmployment.be  This test is not yet approved or cleared by the [place] FDA and has been authorized for detection and/or diagnosis of SARS-CoV-2 by FDA under an Emergency Use Authorization (EUA). This EUA will remain in effect (meaning this test can be used) for the duration of the COVID-19 declaration under Section 564(b)(1) of the Act, 21  U.S.C. section 360bbb-3(b)(1), unless the authorization is terminated or revoked.  Performed at [facility], [address], [place], [place] [address]           Radiology Studies: DG Shoulder Left  Result Date: 07/02/2021 CLINICAL DATA:  Intermittent left shoulder pain, recent fall EXAM: LEFT SHOULDER - 2+ VIEW COMPARISON:  03/09/2021 FINDINGS: Internal rotation, external rotation, and transscapular views of the left shoulder are obtained. No fracture, subluxation, or dislocation. Mild osteoarthritis of the acromioclavicular and glenohumeral joints. Visualized portions of the left chest are clear. IMPRESSION: 1. Mild osteoarthritis.  No acute bony abnormality. Electronically Signed   By: Randa Ngo M.D.   On: 07/02/2021 17:23        Scheduled Meds:  cholecalciferol  1,000 Units Oral Daily   diltiazem  240 mg Oral Daily   gabapentin  300 mg Oral BID   lidocaine  1 patch Transdermal Q24H   metoprolol succinate  12.5 mg Oral Daily   omega-3 acid ethyl esters  1 g Oral Daily   oxybutynin  5 mg Oral BID   zinc sulfate  220 mg Oral Daily   Continuous Infusions:          Aline August, MD [facility] 07/04/2021, 7:50 AM

## 2021-07-04 NOTE — Procedures (Signed)
Injection was given because of patient's difficulty raising the arm prior x-rays and office notes through coronal clinic reviewed and she has probable cuff arthropathy.  It is giving her a great deal of pain and so that is the indication for the injection today. After informed consent was obtained and timeout procedure carried out the lateral aspect of the shoulder was prepped with Betadine thoroughly followed by placement of a 22-gauge needle in the subacromial space injection of 40 mg Kenalog 5 cc half percent Marcaine was tolerated well and a Band-Aid applied.  Patient tolerated this procedure well.

## 2021-07-04 NOTE — Progress Notes (Signed)
OT Cancellation Note  Patient Details Name: [name] [name] MRN: [id number] DOB: 07-12-1941   Cancelled Treatment:    Reason Eval/Treat Not Completed: Patient at procedure or test/ unavailable. Pt out of the room for imaging. Will re-attempt OT tx at later date/time as pt is available.   Ardeth Perfect., MPH, MS, OTR/L ascom [phone] 07/04/21, 1:18 PM

## 2021-07-04 NOTE — Progress Notes (Signed)
Physical Therapy Treatment Patient Details Name: [name] [name] MRN: [id number] DOB: Oct 01, 1941 Today's Date: 07/04/2021   History of Present Illness [name] [name] is a 80 y.o. female with medical history significant for hypertension, chronic atrial fibrillation on Xarelto, DVT, seizure disorder and CVA who presented to the emergency room with acute onset of mechanical fall at home with subsequent severe left hip pain which has been constant and worsening with movement, L HIP CTT: no acute fx, Large subcutaneous hematoma within the left gluteal region.    PT Comments    Pt received supine in bed agreeable to PT. Per RN and EMR pt negative for acute TIA via MRI and CT scan of brain. +2A is required at mod level for bed mobility to transfer from supine to sitting EoB with HOB elevated. Due to B shoulder pain and limited AROM limiting pt's ability to utilize UE support.  Pt only required supervision to stand from bed elevated to RW with VC"s for hand placement. From this point on pt does not require +2A. Able to amb 20' in room with minguard of 2. Pt shuffles and has difficulty with clearing feet bilaterally and keeps RW far outside BOS during ambulation. PT required mod, multi-modals cuing to keep RW within BOS and needed physical assist on RW with sequencing turns with RW to return to recliner. Pt seated in recliner with pillow under buttocks to off load L hip hematoma. All needs in reach, RN notified of pt's status. D/c recs remain appropriate as pt remains high falls risk with poor use of RW and with limited use of BUE"s to assist with mobility(bed level transfers) and ADL's.  Recommendations for follow up therapy are one component of a multi-disciplinary discharge planning process, led by the attending physician.  Recommendations may be updated based on patient status, additional functional criteria and insurance authorization.  Follow Up Recommendations  SNF;Supervision/Assistance - 24  hour     Equipment Recommendations  Other (comment) (next venue of care)    Recommendations for Other Services       Precautions / Restrictions Precautions Precautions: Fall Restrictions Weight Bearing Restrictions: Yes LLE Weight Bearing: Weight bearing as tolerated     Mobility  Bed Mobility Overal bed mobility: Needs Assistance Bed Mobility: Supine to Sit     Supine to sit: Mod assist;+2 for physical assistance;HOB elevated     General bed mobility comments: VC's for sequencing Patient Response: Flat affect  Transfers Overall transfer level: Needs assistance Equipment used: Rolling walker (2 wheeled) Transfers: Sit to/from Stand Sit to Stand: Min guard;+2 safety/equipment         General transfer comment: +2 due to previous documentation of pt needing 2+ person assist. Pt could perform with minguard of 1 with no difficulty.  Ambulation/Gait Ambulation/Gait assistance: Min guard Gait Distance (Feet): 20 Feet Assistive device: Rolling walker (2 wheeled) Gait Pattern/deviations: Shuffle;Narrow base of support     General Gait Details: VC's needed for sequencing and staying clsoer to RW inside BOS.   Stairs             Wheelchair Mobility    Modified Rankin (Stroke Patients Only)       Balance Overall balance assessment: Needs assistance Sitting-balance support: No upper extremity supported;Feet supported Sitting balance-Leahy Scale: Fair     Standing balance support: Bilateral upper extremity supported Standing balance-Leahy Scale: Fair  Cognition Arousal/Alertness: Awake/alert Behavior During Therapy: Flat affect Overall Cognitive Status: No family/caregiver present to determine baseline cognitive functioning                                 General Comments: Tangential speech, displays difficulty with word finding      Exercises      General Comments        Pertinent  Vitals/Pain Pain Assessment: Faces Faces Pain Scale: Hurts little more Pain Location: L hip and L shoulder Pain Descriptors / Indicators: Discomfort;Dull;Grimacing Pain Intervention(s): Limited activity within patient's tolerance;Repositioned    Home Living                      Prior Function            PT Goals (current goals can now be found in the care plan section) Acute Rehab PT Goals Patient Stated Goal: to improve pain PT Goal Formulation: With patient Time For Goal Achievement: 07/16/21 Potential to Achieve Goals: Good Progress towards PT goals: Progressing toward goals    Frequency    Min 2X/week      PT Plan Current plan remains appropriate    Co-evaluation              AM-PAC PT "6 Clicks" Mobility   Outcome Measure  Help needed turning from your back to your side while in a flat bed without using bedrails?: A Lot Help needed moving from lying on your back to sitting on the side of a flat bed without using bedrails?: A Lot Help needed moving to and from a bed to a chair (including a wheelchair)?: A Little Help needed standing up from a chair using your arms (e.g., wheelchair or bedside chair)?: A Little Help needed to walk in hospital room?: A Little Help needed climbing 3-5 steps with a railing? : Total 6 Click Score: 14    End of Session Equipment Utilized During Treatment: Gait belt Activity Tolerance: Patient tolerated treatment well Patient left: in chair;with call bell/phone within reach;with chair alarm set Nurse Communication: Mobility status PT Visit Diagnosis: Unsteadiness on feet (R26.81);Repeated falls (R29.6);Muscle weakness (generalized) (M62.81);History of falling (Z91.81);Difficulty in walking, not elsewhere classified (R26.2);Pain Pain - Right/Left: Left Pain - part of body: Shoulder;Hip     Time: [id number] PT Time Calculation (min) (ACUTE ONLY): 17 min  Charges:  $Gait Training: 8-22 mins                      [name] [name]. Fairly IV, PT, DPT Physical Therapist- [facility]  07/04/2021, 4:02 PM

## 2021-07-05 DIAGNOSIS — S7002XA Contusion of left hip, initial encounter: Principal | ICD-10-CM

## 2021-07-05 LAB — BASIC METABOLIC PANEL
Anion gap: 6 (ref 5–15)
BUN: 17 mg/dL (ref 8–23)
CO2: 22 mmol/L (ref 22–32)
Calcium: 9.3 mg/dL (ref 8.9–10.3)
Chloride: 109 mmol/L (ref 98–111)
Creatinine, Ser: 0.41 mg/dL — ABNORMAL LOW (ref 0.44–1.00)
GFR, Estimated: >60 mL/min (ref >60)
Glucose, Bld: 148 mg/dL — ABNORMAL HIGH (ref 70–99)
Potassium: 4.2 mmol/L (ref 3.5–5.1)
Sodium: 137 mmol/L (ref 135–145)

## 2021-07-05 LAB — CBC WITH DIFFERENTIAL/PLATELET
Abs Immature Granulocytes: 0.05 10*3/uL (ref 0.00–0.07)
Basophils Absolute: 0 10*3/uL (ref 0.0–0.1)
Basophils Relative: 0 %
Eosinophils Absolute: 0 10*3/uL (ref 0.0–0.5)
Eosinophils Relative: 0 %
HCT: 25.7 % — ABNORMAL LOW (ref 36.0–46.0)
Hemoglobin: 8.8 g/dL — ABNORMAL LOW (ref 12.0–15.0)
Immature Granulocytes: 1 %
Lymphocytes Relative: 14 %
Lymphs Abs: 0.6 10*3/uL — ABNORMAL LOW (ref 0.7–4.0)
MCH: 35.5 pg — ABNORMAL HIGH (ref 26.0–34.0)
MCHC: 34.2 g/dL (ref 30.0–36.0)
MCV: 103.6 fL — ABNORMAL HIGH (ref 80.0–100.0)
Monocytes Absolute: 0.2 10*3/uL (ref 0.1–1.0)
Monocytes Relative: 4 %
Neutro Abs: 3.8 10*3/uL (ref 1.7–7.7)
Neutrophils Relative %: 81 %
Platelets: 142 10*3/uL — ABNORMAL LOW (ref 150–400)
RBC: 2.48 MIL/uL — ABNORMAL LOW (ref 3.87–5.11)
RDW: 13.9 % (ref 11.5–15.5)
WBC: 4.6 10*3/uL (ref 4.0–10.5)
nRBC: 0 % (ref 0.0–0.2)

## 2021-07-05 LAB — URINALYSIS, ROUTINE W REFLEX MICROSCOPIC
Bilirubin Urine: NEGATIVE
Glucose, UA: NEGATIVE mg/dL
Hgb urine dipstick: NEGATIVE
Ketones, ur: NEGATIVE mg/dL
Leukocytes,Ua: NEGATIVE
Nitrite: NEGATIVE
Protein, ur: NEGATIVE mg/dL
Specific Gravity, Urine: 1.009 (ref 1.005–1.030)
pH: 8 (ref 5.0–8.0)

## 2021-07-05 LAB — MAGNESIUM: Magnesium: 2.6 mg/dL — ABNORMAL HIGH (ref 1.7–2.4)

## 2021-07-05 NOTE — TOC Progression Note (Addendum)
Transition of Care [facility]) - Progression Note    Patient Details  Name: [name] [name] MRN: [id number] Date of Birth: May 08, 1941  Transition of Care [facility] CM/SW [place], RN Phone Number: 07/05/2021, 1:44 PM  Clinical Narrative:      Reviewed the bed offers with the patient at the bedside and her daughter on the phone they agreed on the bed offer from [facility], I notified [facility] via Phone and thru the Hub, I started the insurance process thru [employer] on the portal, ref number [id number]      Expected Discharge Plan and Services                                                 Social Determinants of Health ([place]) Interventions    Readmission Risk Interventions Readmission Risk Prevention Plan 03/12/2019  Transportation Screening Complete  Some recent data might be hidden

## 2021-07-05 NOTE — Progress Notes (Signed)
Patient ID: [name] [name], female   DOB: May 14, 1941, 80 y.o.   MRN: [id number]  PROGRESS NOTE    [name] [name]  [id number] DOB: 11-01-1940 DOA: 07/01/2021 PCP: Birdie Sons, MD   Brief Narrative:  80 y.o. female with medical history significant for hypertension, chronic atrial fibrillation on Xarelto, DVT, seizure disorder and CVA presented with fall and left hip pain.  On presentation, hemoglobin was 10.8 (hemoglobin of 12 on 06/14/2021) with INR of 1.7.  Hip x-ray showed no acute fracture or dislocation.  CT of the head showed no acute intracranial abnormalities. Left hip CT showed large subcutaneous hematoma within the left gluteal region with no acute displaced fractures.  Orthopedics was consulted.  Assessment & Plan:   Large left gluteal subcutaneous hematoma after a mechanical fall -Orthopedics following and currently recommending conservative medical management but would need surgical intervention if hematoma worsens or becomes more fluctuant.  Fall precautions.   -PT recommends SNF placement.  Social worker following. - Xarelto on hold.  Monitor H&H.  Hemoglobin 8.8 today.  Transfuse if hemoglobin is less than 7. -Resume Xarelto once cleared by orthopedics  Severe left shoulder pain -Shoulder x-ray negative for fracture.  Possibly related to cuff arthropathy.  Status post steroid injection by orthopedics on 07/04/2021.  Chronic atrial fibrillation -Currently rate controlled.  Continue Cardizem CD and Toprol-XL.  Xarelto on hold.  Delirium/mild expressive aphasia in a patient with history of unspecified CVA -Patient had some expressive aphasia on 07/04/2021 although she would respond appropriately intermittently and there were no signs of focal neurologic deficits.  CT and MRI of brain without contrast was negative for acute intracranial abnormality -We will resume Xarelto as soon as cleared by orthopedics.  Monitor mental status.  Fall  precautions.  Thrombocytopenia -Questionable cause.  Monitor.  Mild leukocytosis -Resolved  Hypertension -Blood pressure improving.  Toprol-XL and Cardizem CD have been resumed.  Overactive bladder -Continue Ditropan  DVT prophylaxis: SCDs Code Status: DNR Family Communication: Spoke to daughter on phone on 07/04/2021 disposition Plan: Status is: Inpatient because: Inpatient level of care appropriate due to severity of illness  Dispo: The patient is from: Home              Anticipated d/c is to: SNF              Patient currently is medically stable to d/c.   Difficult to place patient No  Consultants: Orthopedics  Procedures: None  Antimicrobials: None   Subjective: Patient seen and examined at bedside.  Poor historian.  No worsening shortness of breath, fever or vomiting reported  objective: Vitals:   07/04/21 1745 07/04/21 1936 07/05/21 0221 07/05/21 0500  BP: 126/60 123/64 121/64   Pulse: 65 (!) 58 79   Resp:  20 20   Temp:  97.7 F (36.5 C) (!) 97.3 F (36.3 C)   TempSrc:      SpO2:  95% 94%   Weight:    77.7 kg  Height:        Intake/Output Summary (Last 24 hours) at 07/05/2021 0805 Last data filed at 07/05/2021 0650 Gross per 24 hour  Intake --  Output 200 ml  Net -200 ml    Filed Weights   07/03/21 0500 07/04/21 0500 07/05/21 0500  Weight: 80.8 kg 80.8 kg 77.7 kg    Examination:  General exam: No acute distress.  Currently on room air.  Looks chronically ill and deconditioned.  Poor historian.  Slow to respond but answers questions  appropriately Respiratory system: Bilateral decreased breath sounds at bases cardiovascular system: Rate controlled, S1-S2 heard  gastrointestinal system: Abdomen is distended mildly, soft and nontender.  Bowel sounds are heard extremities: No clubbing; some left hip tenderness present.  Mild lower extremity edema present    Data Reviewed: I have personally reviewed following labs and imaging studies  CBC: Recent  Labs  Lab 07/01/21 2114 07/01/21 2340 07/02/21 0529 07/02/21 1124 07/02/21 1719 07/03/21 0414 07/04/21 0640 07/05/21 0443  WBC 11.1*  --  8.1  --   --  7.4 7.3 4.6  NEUTROABS 8.6*  --   --   --   --  4.6 4.2 3.8  HGB 10.8*   < > 9.1* 9.0* 9.0* 8.2* 8.2* 8.8*  HCT 31.7*   < > 27.7* 28.1* 27.2* 24.7* 24.8* 25.7*  MCV 101.3*  --  102.6*  --   --  103.8* 104.2* 103.6*  PLT 185  --  159  --   --  146* 140* 142*   < > = values in this interval not displayed.    Basic Metabolic Panel: Recent Labs  Lab 07/01/21 2114 07/02/21 0529 07/03/21 0414 07/04/21 0640 07/05/21 0443  NA 135 136 135 137 137  K 4.1 4.1 3.9 3.9 4.2  CL 104 107 106 107 109  CO2 '23 23 24 25 22  '$ GLUCOSE 138* 121* 104* 99 148*  BUN '20 19 14 19 17  '$ CREATININE 0.69 0.54 0.63 0.55 0.41*  CALCIUM 9.6 8.8* 8.3* 8.8* 9.3  MG  --   --  2.1 2.2 2.6*    GFR: Estimated Creatinine Clearance: 52.6 mL/min (A) (by C-G formula based on SCr of 0.41 mg/dL (L)). Liver Function Tests: No results for input(s): AST, ALT, ALKPHOS, BILITOT, PROT, ALBUMIN in the last 168 hours. No results for input(s): LIPASE, AMYLASE in the last 168 hours. No results for input(s): AMMONIA in the last 168 hours. Coagulation Profile: Recent Labs  Lab 07/01/21 2114  INR 1.7*    Cardiac Enzymes: No results for input(s): CKTOTAL, CKMB, CKMBINDEX, TROPONINI in the last 168 hours. BNP (last 3 results) No results for input(s): PROBNP in the last 8760 hours. HbA1C: No results for input(s): HGBA1C in the last 72 hours. CBG: No results for input(s): GLUCAP in the last 168 hours. Lipid Profile: No results for input(s): CHOL, HDL, LDLCALC, TRIG, CHOLHDL, LDLDIRECT in the last 72 hours. Thyroid Function Tests: No results for input(s): TSH, T4TOTAL, FREET4, T3FREE, THYROIDAB in the last 72 hours. Anemia Panel: No results for input(s): VITAMINB12, FOLATE, FERRITIN, TIBC, IRON, RETICCTPCT in the last 72 hours. Sepsis Labs: No results for input(s):  PROCALCITON, LATICACIDVEN in the last 168 hours.  Recent Results (from the past 240 hour(s))  Resp Panel by RT-PCR (Flu A&B, Covid) Nasopharyngeal Swab     Status: None   Collection Time: 07/01/21 11:40 PM   Specimen: Nasopharyngeal Swab; Nasopharyngeal(NP) swabs in vial transport medium  Result Value Ref Range Status   SARS Coronavirus 2 by RT PCR NEGATIVE NEGATIVE Final    Comment: (NOTE) SARS-CoV-2 target nucleic acids are NOT DETECTED.  The SARS-CoV-2 RNA is generally detectable in upper respiratory specimens during the acute phase of infection. The lowest concentration of SARS-CoV-2 viral copies this assay can detect is 138 copies/mL. A negative result does not preclude SARS-Cov-2 infection and should not be used as the sole basis for treatment or other patient management decisions. A negative result may occur with  improper specimen collection/handling, submission of specimen other  than nasopharyngeal swab, presence of viral mutation(s) within the areas targeted by this assay, and inadequate number of viral copies(<138 copies/mL). A negative result must be combined with clinical observations, patient history, and epidemiological information. The expected result is Negative.  Fact Sheet for Patients:  EntrepreneurPulse.com.au  Fact Sheet for Healthcare Providers:  IncredibleEmployment.be  This test is no t yet approved or cleared by the [place] FDA and  has been authorized for detection and/or diagnosis of SARS-CoV-2 by FDA under an Emergency Use Authorization (EUA). This EUA will remain  in effect (meaning this test can be used) for the duration of the COVID-19 declaration under Section 564(b)(1) of the Act, 21 U.S.C.section 360bbb-3(b)(1), unless the authorization is terminated  or revoked sooner.       Influenza A by PCR NEGATIVE NEGATIVE Final   Influenza B by PCR NEGATIVE NEGATIVE Final    Comment: (NOTE) The Xpert Xpress  SARS-CoV-2/FLU/RSV plus assay is intended as an aid in the diagnosis of influenza from Nasopharyngeal swab specimens and should not be used as a sole basis for treatment. Nasal washings and aspirates are unacceptable for Xpert Xpress SARS-CoV-2/FLU/RSV testing.  Fact Sheet for Patients: EntrepreneurPulse.com.au  Fact Sheet for Healthcare Providers: IncredibleEmployment.be  This test is not yet approved or cleared by the [place] FDA and has been authorized for detection and/or diagnosis of SARS-CoV-2 by FDA under an Emergency Use Authorization (EUA). This EUA will remain in effect (meaning this test can be used) for the duration of the COVID-19 declaration under Section 564(b)(1) of the Act, 21 U.S.C. section 360bbb-3(b)(1), unless the authorization is terminated or revoked.  Performed at [facility], [address] [place], [place] [address]           Radiology Studies: CT HEAD WO CONTRAST (5MM)  Result Date: 07/04/2021 CLINICAL DATA:  Transient ischemic attack (TIA) EXAM: CT HEAD WITHOUT CONTRAST TECHNIQUE: Contiguous axial images were obtained from the base of the skull through the vertex without intravenous contrast. COMPARISON:  Head CT 06/14/2021. FINDINGS: Brain: No evidence of acute intracranial hemorrhage or extra-axial collection.No evidence of mass lesion/concern mass effect.The ventricles are normal in size.Confluent periventricular and subcortical white matter hypoattenuation, which is nonspecific but likely sequela of chronic small vessel ischemic disease.Mild cerebral atrophy Vascular: Vascular calcifications. Skull: Normal. Negative for fracture or focal lesion. Sinuses/Orbits: No acute finding. Other: None. IMPRESSION: No acute intracranial abnormality. Unchanged sequela of chronic small vessel ischemic disease and cerebral atrophy. Electronically Signed   By: Maurine Simmering M.D.   On: 07/04/2021 12:24   MR BRAIN WO  CONTRAST  Result Date: 07/04/2021 CLINICAL DATA:  TIA, worsening mental status EXAM: MRI HEAD WITHOUT CONTRAST TECHNIQUE: Multiplanar, multiecho pulse sequences of the brain and surrounding structures were obtained without intravenous contrast. COMPARISON:  Same-day noncontrast CT head, brain MRI 12/03/2019 FINDINGS: Brain: There is no acute intracranial hemorrhage, extra-axial fluid collection, or acute infarct. There is a remote infarct in the left superior frontal gyrus near the vertex with associated SWI signal dropout, unchanged. An additional small remote infarct in the left temporal lobe is noted. Additional confluent FLAIR signal abnormality throughout the subcortical and periventricular white matter likely reflecting moderate chronic white matter microangiopathy is not significantly changed compared to the prior MRI. There is no mass lesion. The ventricles are stable in size. There is no midline shift. Vascular: Normal flow voids. Skull and upper cervical spine: Normal marrow signal. Sinuses/Orbits: The paranasal sinuses are clear. Bilateral lens implants are in place. The globes  and orbits are otherwise unremarkable. Other: None. IMPRESSION: 1. No acute intracranial pathology. 2. Stable remote infarcts in the left superior frontal gyrus and temporal lobe, and moderate chronic white matter microangiopathy. Electronically Signed   By: Valetta Mole M.D.   On: 07/04/2021 14:44        Scheduled Meds:  cholecalciferol  1,000 Units Oral Daily   diltiazem  240 mg Oral Daily   gabapentin  300 mg Oral BID   lidocaine  1 patch Transdermal Q24H   metoprolol succinate  12.5 mg Oral Daily   omega-3 acid ethyl esters  1 g Oral Daily   oxybutynin  5 mg Oral BID   zinc sulfate  220 mg Oral Daily   Continuous Infusions:          Aline August, MD [facility] 07/05/2021, 8:05 AM

## 2021-07-05 NOTE — Progress Notes (Signed)
Occupational Therapy Treatment Patient Details Name: [name] [name] MRN: [id number] DOB: 09-25-1941 Today's Date: 07/05/2021   History of present illness [name] [name] is a 80 y.o. female with medical history significant for hypertension, chronic atrial fibrillation on Xarelto, DVT, seizure disorder and CVA who presented to the emergency room with acute onset of mechanical fall at home with subsequent severe left hip pain which has been constant and worsening with movement, L HIP CTT: no acute fx, Large subcutaneous hematoma within the left gluteal region.   OT comments  Pt seen for OT tx. Pt expressed frustration that breakfast had not arrived yet, but with encouragement was agreeable to bed mobility and grooming tasks in preparation for breakfast. Pt declined EOB. Instructed in feet positioning to support participation in bed mobility and weight shift towards head of the bed with goal to increased pt's overall participation in care and minimize caregiver burden. Pt required bed in flat position and MAX A ultimately to achieve optimal positioning. Set up assist for washing her face and hands and for preparing breakfast items when tray arrived. Limited BUE shoulder flexion attempts noted. Pt continues to benefit from skilled OT services. Continue to recommend SNF at discharge.    Recommendations for follow up therapy are one component of a multi-disciplinary discharge planning process, led by the attending physician.  Recommendations may be updated based on patient status, additional functional criteria and insurance authorization.    Follow Up Recommendations  SNF    Equipment Recommendations  3 in 1 bedside commode    Recommendations for Other Services      Precautions / Restrictions Precautions Precautions: Fall Restrictions Weight Bearing Restrictions: Yes LLE Weight Bearing: Weight bearing as tolerated       Mobility Bed Mobility Overal bed mobility: Needs Assistance              General bed mobility comments: MAX A for bed mobility for repositioning and weight shift towards head of the bed. Pt instructed in feet positioning to improve participation. Pt declined additional EOB/OOB attempts    Transfers                      Balance                                           ADL either performed or assessed with clinical judgement   ADL Overall ADL's : Needs assistance/impaired Eating/Feeding: Set up;Bed level   Grooming: Wash/dry hands;Wash/dry face;Set up;Bed level                                       Vision       Perception     Praxis      Cognition Arousal/Alertness: Awake/alert Behavior During Therapy: Flat affect Overall Cognitive Status: No family/caregiver present to determine baseline cognitive functioning                                 General Comments: alert and able to follow simple commands, appropriate conversationally        Exercises     Shoulder Instructions       General Comments      Pertinent Vitals/ Pain  Pain Assessment: Faces Faces Pain Scale: Hurts little more Pain Location: L hip and L shoulder Pain Descriptors / Indicators: Discomfort;Dull;Grimacing Pain Intervention(s): Limited activity within patient's tolerance;Monitored during session;Repositioned;Premedicated before session  Home Living                                          Prior Functioning/Environment              Frequency  Min 2X/week        Progress Toward Goals  OT Goals(current goals can now be found in the care plan section)  Progress towards OT goals: OT to reassess next treatment  Acute Rehab OT Goals Patient Stated Goal: to improve pain OT Goal Formulation: With patient Time For Goal Achievement: 07/16/21 Potential to Achieve Goals: [place] Discharge plan remains appropriate;Frequency remains appropriate     Co-evaluation                 AM-PAC OT "6 Clicks" Daily Activity     Outcome Measure   Help from another person eating meals?: A Little Help from another person taking care of personal grooming?: A Little Help from another person toileting, which includes using toliet, bedpan, or urinal?: A Lot Help from another person bathing (including washing, rinsing, drying)?: A Lot Help from another person to put on and taking off regular upper body clothing?: A Lot Help from another person to put on and taking off regular lower body clothing?: A Lot 6 Click Score: 14    End of Session    OT Visit Diagnosis: Unsteadiness on feet (R26.81);Other abnormalities of gait and mobility (R26.89)   Activity Tolerance Patient tolerated treatment well;Other (comment) (put upset breakfast was late)   Patient Left in bed;with call bell/phone within reach;with bed alarm set   Nurse Communication          Time: [phone] OT Time Calculation (min): 11 min  Charges: OT General Charges $OT Visit: 1 Visit OT Treatments $Self Care/Home Management : 8-22 mins  Ardeth Perfect., MPH, MS, OTR/L ascom [phone] 07/05/21, 10:14 AM

## 2021-07-05 NOTE — Plan of Care (Signed)
Patient sleeping between care. Pain managed on current regimen. Patient confused overnight. Randol Kern, NP made aware. Bed in lowest position. Bed alarm on.   PLAN OF CARE ONGOING Problem: Education: Goal: Knowledge of General Education information will improve Description: Including pain rating scale, medication(s)/side effects and non-pharmacologic comfort measures Outcome: Progressing   Problem: Health Behavior/Discharge Planning: Goal: Ability to manage health-related needs will improve Outcome: Progressing   Problem: Clinical Measurements: Goal: Ability to maintain clinical measurements within normal limits will improve Outcome: Progressing Goal: Will remain free from infection Outcome: Progressing Goal: Diagnostic test results will improve Outcome: Progressing Goal: Respiratory complications will improve Outcome: Progressing Goal: Cardiovascular complication will be avoided Outcome: Progressing   Problem: Activity: Goal: Risk for activity intolerance will decrease Outcome: Progressing   Problem: Nutrition: Goal: Adequate nutrition will be maintained Outcome: Progressing   Problem: Coping: Goal: Level of anxiety will decrease Outcome: Progressing   Problem: Elimination: Goal: Will not experience complications related to bowel motility Outcome: Progressing Goal: Will not experience complications related to urinary retention Outcome: Progressing   Problem: Pain Managment: Goal: General experience of comfort will improve Outcome: Progressing   Problem: Safety: Goal: Ability to remain free from injury will improve Outcome: Progressing   Problem: Skin Integrity: Goal: Risk for impaired skin integrity will decrease Outcome: Progressing

## 2021-07-05 NOTE — Care Management Important Message (Signed)
Important Message  Patient Details  Name: [name] [name] MRN: [id number] Date of Birth: [birth year]   Medicare Important Message Given:  Yes  Per RNCM I talked with her daughter, Trevor Mace [phone]) by phone as she has been involved with her mother's discharge planning. I reviewed the Important Message from Medicare with her and she is in agreement with the discharge plan.  I asked if she would like for me to send a copy of the form but she declined.  I wished her mother well and thanked her for time.  [name] A Avia Merkley 07/05/2021, 3:21 PM

## 2021-07-05 NOTE — TOC Progression Note (Signed)
Transition of Care [facility]) - Progression Note    Patient Details  Name: [name] [name] MRN: [id number] Date of Birth: July 27, 1941  [facility] CM/SW Contact  Su Hilt, RN Phone Number: 07/05/2021, 3:35 PM  Clinical Narrative:     Spoke to patient's daughter [name] on the phone I explained that we have to get insurance approval then the patient will DC to [facility], I am waiting on [facility] to call with a bed, they know that insurance is pending [name] will transport the patient       Expected Discharge Plan and Services                                                 Social Determinants of Health (SDOH) Interventions    Readmission Risk Interventions Readmission Risk Prevention Plan 03/12/2019  Transportation Screening Complete  Some recent data might be hidden

## 2021-07-05 NOTE — Progress Notes (Signed)
Physical Therapy Treatment Patient Details Name: [name] [name] MRN: [id number] DOB: 1941/05/17 Today's Date: 07/05/2021   History of Present Illness [name] [name] is a 80 y.o. female with medical history significant for hypertension, chronic atrial fibrillation on Xarelto, DVT, seizure disorder and CVA who presented to the emergency room with acute onset of mechanical fall at home with subsequent severe left hip pain which has been constant and worsening with movement, L HIP CTT: no acute fx, Large subcutaneous hematoma within the left gluteal region.    PT Comments    Pt received seated upright in bed agreeable to PT services.Pt only required supervision to transfer supine to seated EoB however requires HOB elevated and use of bed rails with increased time to complete. Able to stand to RW with supervision as well with VC's for hand placement. Pt tolerated ambulating 50' with RW however remains with shuffling gait pattern and RW forward outside of BOS despite Max VC's and TC's on RW.  Pt returned to sitting EOB with good RW sequencing but still requiring VC's for safe hand placement. ModA+1 for LE management to return to supine then MaxA+1 to scoot up in bed despite pt education on using LE's to push for reduced caregiver burden. D/c recs remain appropriate to address safety concerns with poor use of RW with ambulation still placing pt at risk for falls.    Recommendations for follow up therapy are one component of a multi-disciplinary discharge planning process, led by the attending physician.  Recommendations may be updated based on patient status, additional functional criteria and insurance authorization.  Follow Up Recommendations  SNF;Supervision/Assistance - 24 hour     Equipment Recommendations       Recommendations for Other Services       Precautions / Restrictions Precautions Precautions: Fall Restrictions Weight Bearing Restrictions: Yes LLE Weight Bearing: Weight  bearing as tolerated     Mobility  Bed Mobility Overal bed mobility: Needs Assistance Bed Mobility: Supine to Sit;Sit to Supine     Supine to sit: Supervision;HOB elevated Sit to supine: Mod assist   General bed mobility comments: ModA for LE's into bed. MaxA to scoot up in bed. Patient Response: Cooperative;Flat affect  Transfers Overall transfer level: Needs assistance Equipment used: Rolling walker (2 wheeled) Transfers: Sit to/from Stand Sit to Stand: Min guard         General transfer comment: Requires Mod VC's for safe hand placement  Ambulation/Gait Ambulation/Gait assistance: Min guard Gait Distance (Feet): 50 Feet Assistive device: Rolling walker (2 wheeled) Gait Pattern/deviations: Shuffle;Narrow base of support     General Gait Details: VC's needed for sequencing and staying clsoer to RW inside BOS.   Stairs             Wheelchair Mobility    Modified Rankin (Stroke Patients Only)       Balance Overall balance assessment: Needs assistance Sitting-balance support: No upper extremity supported;Feet supported Sitting balance-Leahy Scale: Fair     Standing balance support: Bilateral upper extremity supported Standing balance-Leahy Scale: Fair Standing balance comment: Able to stand with SUE resting on counter. Slight unsteadiness requiring minguard support.                            Cognition Arousal/Alertness: Awake/alert Behavior During Therapy: Flat affect Overall Cognitive Status: No family/caregiver present to determine baseline cognitive functioning  General Comments: alert and able to follow simple commands, appropriate conversationally      Exercises      General Comments        Pertinent Vitals/Pain Pain Assessment: Faces Faces Pain Scale: Hurts a little bit Pain Intervention(s): Repositioned    Home Living                      Prior Function             PT Goals (current goals can now be found in the care plan section) Acute Rehab PT Goals PT Goal Formulation: With patient Time For Goal Achievement: 07/16/21 Potential to Achieve Goals: Good Progress towards PT goals: Progressing toward goals    Frequency    Min 2X/week      PT Plan Current plan remains appropriate    Co-evaluation              AM-PAC PT "6 Clicks" Mobility   Outcome Measure  Help needed turning from your back to your side while in a flat bed without using bedrails?: A Little Help needed moving from lying on your back to sitting on the side of a flat bed without using bedrails?: A Lot Help needed moving to and from a bed to a chair (including a wheelchair)?: A Little Help needed standing up from a chair using your arms (e.g., wheelchair or bedside chair)?: A Little Help needed to walk in hospital room?: A Little Help needed climbing 3-5 steps with a railing? : Total 6 Click Score: 15    End of Session Equipment Utilized During Treatment: Gait belt Activity Tolerance: Patient tolerated treatment well Patient left: in bed;with call bell/phone within reach;with bed alarm set Nurse Communication: Mobility status PT Visit Diagnosis: Unsteadiness on feet (R26.81);Repeated falls (R29.6);Muscle weakness (generalized) (M62.81);History of falling (Z91.81);Difficulty in walking, not elsewhere classified (R26.2);Pain Pain - Right/Left: Left Pain - part of body: Shoulder;Hip     Time: [id number] PT Time Calculation (min) (ACUTE ONLY): 23 min  Charges:  $Gait Training: 23-37 mins                    Salem Caster. Fairly IV, PT, DPT Physical Therapist- [facility]  07/05/2021, 3:05 PM

## 2021-07-06 DIAGNOSIS — W19XXXA Unspecified fall, initial encounter: Secondary | ICD-10-CM | POA: Diagnosis not present

## 2021-07-06 DIAGNOSIS — N3289 Other specified disorders of bladder: Secondary | ICD-10-CM | POA: Diagnosis not present

## 2021-07-06 DIAGNOSIS — I639 Cerebral infarction, unspecified: Secondary | ICD-10-CM | POA: Diagnosis not present

## 2021-07-06 DIAGNOSIS — M25512 Pain in left shoulder: Secondary | ICD-10-CM | POA: Diagnosis not present

## 2021-07-06 DIAGNOSIS — M5136 Other intervertebral disc degeneration, lumbar region: Secondary | ICD-10-CM | POA: Diagnosis not present

## 2021-07-06 DIAGNOSIS — R293 Abnormal posture: Secondary | ICD-10-CM | POA: Diagnosis not present

## 2021-07-06 DIAGNOSIS — M6281 Muscle weakness (generalized): Secondary | ICD-10-CM | POA: Diagnosis not present

## 2021-07-06 DIAGNOSIS — R2689 Other abnormalities of gait and mobility: Secondary | ICD-10-CM | POA: Diagnosis not present

## 2021-07-06 DIAGNOSIS — N3281 Overactive bladder: Secondary | ICD-10-CM | POA: Diagnosis not present

## 2021-07-06 DIAGNOSIS — Z8673 Personal history of transient ischemic attack (TIA), and cerebral infarction without residual deficits: Secondary | ICD-10-CM | POA: Diagnosis not present

## 2021-07-06 DIAGNOSIS — W19XXXD Unspecified fall, subsequent encounter: Secondary | ICD-10-CM | POA: Diagnosis not present

## 2021-07-06 DIAGNOSIS — G629 Polyneuropathy, unspecified: Secondary | ICD-10-CM | POA: Diagnosis not present

## 2021-07-06 DIAGNOSIS — S7002XD Contusion of left hip, subsequent encounter: Secondary | ICD-10-CM | POA: Diagnosis not present

## 2021-07-06 DIAGNOSIS — Z7901 Long term (current) use of anticoagulants: Secondary | ICD-10-CM | POA: Diagnosis not present

## 2021-07-06 DIAGNOSIS — R278 Other lack of coordination: Secondary | ICD-10-CM | POA: Diagnosis not present

## 2021-07-06 DIAGNOSIS — R4701 Aphasia: Secondary | ICD-10-CM | POA: Diagnosis not present

## 2021-07-06 DIAGNOSIS — M6259 Muscle wasting and atrophy, not elsewhere classified, multiple sites: Secondary | ICD-10-CM | POA: Diagnosis not present

## 2021-07-06 DIAGNOSIS — Z741 Need for assistance with personal care: Secondary | ICD-10-CM | POA: Diagnosis not present

## 2021-07-06 DIAGNOSIS — R2681 Unsteadiness on feet: Secondary | ICD-10-CM | POA: Diagnosis not present

## 2021-07-06 DIAGNOSIS — I4891 Unspecified atrial fibrillation: Secondary | ICD-10-CM | POA: Diagnosis not present

## 2021-07-06 DIAGNOSIS — M25552 Pain in left hip: Secondary | ICD-10-CM | POA: Diagnosis not present

## 2021-07-06 DIAGNOSIS — T148XXA Other injury of unspecified body region, initial encounter: Secondary | ICD-10-CM | POA: Diagnosis not present

## 2021-07-06 DIAGNOSIS — I10 Essential (primary) hypertension: Secondary | ICD-10-CM | POA: Diagnosis not present

## 2021-07-06 DIAGNOSIS — I482 Chronic atrial fibrillation, unspecified: Secondary | ICD-10-CM | POA: Diagnosis not present

## 2021-07-06 DIAGNOSIS — K5900 Constipation, unspecified: Secondary | ICD-10-CM | POA: Diagnosis not present

## 2021-07-06 DIAGNOSIS — R269 Unspecified abnormalities of gait and mobility: Secondary | ICD-10-CM | POA: Diagnosis not present

## 2021-07-06 DIAGNOSIS — R569 Unspecified convulsions: Secondary | ICD-10-CM | POA: Diagnosis not present

## 2021-07-06 DIAGNOSIS — M810 Age-related osteoporosis without current pathological fracture: Secondary | ICD-10-CM | POA: Diagnosis not present

## 2021-07-06 LAB — BASIC METABOLIC PANEL
Anion gap: 5 (ref 5–15)
BUN: 22 mg/dL (ref 8–23)
CO2: 25 mmol/L (ref 22–32)
Calcium: 9.4 mg/dL (ref 8.9–10.3)
Chloride: 110 mmol/L (ref 98–111)
Creatinine, Ser: 0.53 mg/dL (ref 0.44–1.00)
GFR, Estimated: >60 mL/min (ref >60)
Glucose, Bld: 129 mg/dL — ABNORMAL HIGH (ref 70–99)
Potassium: 4.3 mmol/L (ref 3.5–5.1)
Sodium: 140 mmol/L (ref 135–145)

## 2021-07-06 LAB — CBC WITH DIFFERENTIAL/PLATELET
Abs Immature Granulocytes: 0.04 10*3/uL (ref 0.00–0.07)
Basophils Absolute: 0 10*3/uL (ref 0.0–0.1)
Basophils Relative: 0 %
Eosinophils Absolute: 0 10*3/uL (ref 0.0–0.5)
Eosinophils Relative: 0 %
HCT: 27.5 % — ABNORMAL LOW (ref 36.0–46.0)
Hemoglobin: 9 g/dL — ABNORMAL LOW (ref 12.0–15.0)
Immature Granulocytes: 0 %
Lymphocytes Relative: 10 %
Lymphs Abs: 0.9 10*3/uL (ref 0.7–4.0)
MCH: 34.9 pg — ABNORMAL HIGH (ref 26.0–34.0)
MCHC: 32.7 g/dL (ref 30.0–36.0)
MCV: 106.6 fL — ABNORMAL HIGH (ref 80.0–100.0)
Monocytes Absolute: 0.7 10*3/uL (ref 0.1–1.0)
Monocytes Relative: 7 %
Neutro Abs: 7.4 10*3/uL (ref 1.7–7.7)
Neutrophils Relative %: 83 %
Platelets: 153 10*3/uL (ref 150–400)
RBC: 2.58 MIL/uL — ABNORMAL LOW (ref 3.87–5.11)
RDW: 14.6 % (ref 11.5–15.5)
WBC: 9 10*3/uL (ref 4.0–10.5)
nRBC: 0 % (ref 0.0–0.2)

## 2021-07-06 LAB — RESP PANEL BY RT-PCR (FLU A&B, COVID) ARPGX2
Influenza A by PCR: NEGATIVE
Influenza B by PCR: NEGATIVE
SARS Coronavirus 2 by RT PCR: NEGATIVE

## 2021-07-06 LAB — MAGNESIUM: Magnesium: 2.8 mg/dL — ABNORMAL HIGH (ref 1.7–2.4)

## 2021-07-06 MED ORDER — RIVAROXABAN 20 MG PO TABS
20.0000 mg | ORAL_TABLET | Freq: Every day | ORAL | Status: DC
  Filled 2021-07-06: qty 1

## 2021-07-06 MED ORDER — TRAMADOL HCL 50 MG PO TABS: 50.0000 mg | ORAL_TABLET | Freq: Four times a day (QID) | ORAL | 3 refills | Status: AC | PRN

## 2021-07-06 NOTE — Discharge Summary (Signed)
Physician Discharge Summary  Danyele Cullipher Loewenstein [id number] DOB: Oct 02, 1941 DOA: 07/01/2021  PCP: Birdie Sons, MD  Admit date: 07/01/2021 Discharge date: 07/06/2021  Admitted From: home Disposition:  SNF  Recommendations for Outpatient Follow-up:  Follow up with PCP in 1-2 weeks Please obtain BMP/CBC in one week  Home Health: Equipment/Devices:  Discharge Condition: Stable CODE STATUS: DNR Diet recommendation: Heart healthy  Brief/Interim Summary: 80 y.o. female with medical history significant for hypertension, chronic atrial fibrillation on Xarelto, DVT, seizure disorder and CVA presented with fall and left hip pain.  On presentation, hemoglobin was 10.8 (hemoglobin of 12 on 06/14/2021) with INR of 1.7.  Hip x-ray showed no acute fracture or dislocation.  CT of the head showed no acute intracranial abnormalities. Left hip CT showed large subcutaneous hematoma within the left gluteal region with no acute displaced fractures.  Orthopedics was consulted.  Discharge Diagnoses:  Active Problems:   Fall   Hip pain  Large left gluteal subcutaneous hematoma after a mechanical fall -Orthopedics following and currently recommending conservative medical management but would need surgical intervention if hematoma worsens or becomes more fluctuant.  Fall precautions.   -PT recommends SNF placement.  Social worker following. - Xarelto initially on hold.  Hemoglobin remained stable throughout hospital course. -She has now been restarted on Xarelto -Repeat CBC in 1 week. -Cleared by orthopedics for discharge  Severe left shoulder pain -Shoulder x-ray negative for fracture.  Possibly related to cuff arthropathy.  Status post steroid injection by orthopedics on 07/04/2021.   Chronic atrial fibrillation -Currently rate controlled.  Continue Cardizem CD and Toprol-XL.  Anticoagulated on Xarelto   Delirium/mild expressive aphasia in a patient with history of unspecified CVA -Patient had  some expressive aphasia on 07/04/2021 although she would respond appropriately intermittently and there were no signs of focal neurologic deficits.  CT and MRI of brain without contrast was negative for acute intracranial abnormality -Mental status appears to be back to baseline.   Thrombocytopenia -Mild, now resolved   Mild leukocytosis -Resolved  Hypertension -Blood pressure improving.  Toprol-XL and Cardizem CD have been resumed.  Overactive bladder -Continue Ditropan  Discharge Instructions  Discharge Instructions     Diet - low sodium heart healthy   Complete by: As directed    Increase activity slowly   Complete by: As directed       Allergies as of 07/06/2021       Reactions   Aspirin Other (See Comments)   On xarelto        Medication List     STOP taking these medications    amoxicillin 500 MG capsule Commonly known as: AMOXIL       TAKE these medications    acetaminophen 650 MG CR tablet Commonly known as: TYLENOL Take 650 mg by mouth every 8 (eight) hours as needed for pain.   ACIDOPHILUS PO Take 1 tablet by mouth at bedtime.   alendronate 70 MG tablet Commonly known as: FOSAMAX TAKE 1 TABLET BY MOUTH WEEKLY IN THE MORNING WITH A FULL GLASS OF WATER. DO NOT EAT, DRINK OR LIE DOWN FOR 30 MIN.   Biotin 10 MG Caps Take by mouth daily.   bisacodyl 5 MG EC tablet Commonly known as: DULCOLAX Take 5 mg by mouth daily as needed for moderate constipation.   CALCIUM 1200+D3 PO Take 1,200 mg by mouth daily. Citracal ER   cholecalciferol 1000 units tablet Commonly known as: VITAMIN D Take 1,000 Units by mouth daily.   Co Q-10  100 MG Caps Take 100 mg by mouth daily.   diltiazem 240 MG 24 hr capsule Commonly known as: CARDIZEM CD Take 1 capsule (240 mg total) by mouth daily.   EQL B Complex 100 Tabs daily.   gabapentin 300 MG capsule Commonly known as: NEURONTIN Take 1 capsule (300 mg total) by mouth 2 (two) times daily.   ICY HOT BACK  EX Apply topically as needed.   lidocaine 5 % Commonly known as: LIDODERM Place 1 patch onto the skin daily. Remove & Discard patch within 12 hours   MEGARED OMEGA-3 KRILL OIL PO Take 300 mg by mouth daily.   metoprolol succinate 25 MG 24 hr tablet Commonly known as: TOPROL-XL Take 0.5 tablets (12.5 mg total) by mouth daily.   oxybutynin 5 MG tablet Commonly known as: DITROPAN TAKE 1 TABLET BY MOUTH  TWICE DAILY   rivaroxaban 20 MG Tabs tablet Commonly known as: Xarelto TAKE 1 TABLET BY MOUTH DAILY WITH SUPPER   Soothe XP Xtra Protection Soln Apply 1 drop to eye as needed (both eyes).   traMADol 50 MG tablet Commonly known as: ULTRAM Take 1 tablet (50 mg total) by mouth every 6 (six) hours as needed.   TURMERIC PO Take 800 mg by mouth 2 (two) times daily.   Zinc 50 MG Caps Take 25 mg by mouth daily.        Contact information for after-discharge care     Destination     [facility] Preferred SNF .   Service: Skilled Nursing Contact information: [place] [phone]                    Allergies  Allergen Reactions   Aspirin Other (See Comments)    On xarelto    Consultations: Orthopedics   Procedures/Studies: CT HEAD WO CONTRAST (5MM)  Result Date: 07/04/2021 CLINICAL DATA:  Transient ischemic attack (TIA) EXAM: CT HEAD WITHOUT CONTRAST TECHNIQUE: Contiguous axial images were obtained from the base of the skull through the vertex without intravenous contrast. COMPARISON:  Head CT 06/14/2021. FINDINGS: Brain: No evidence of acute intracranial hemorrhage or extra-axial collection.No evidence of mass lesion/concern mass effect.The ventricles are normal in size.Confluent periventricular and subcortical white matter hypoattenuation, which is nonspecific but likely sequela of chronic small vessel ischemic disease.Mild cerebral atrophy Vascular: Vascular calcifications. Skull: Normal.  Negative for fracture or focal lesion. Sinuses/Orbits: No acute finding. Other: None. IMPRESSION: No acute intracranial abnormality. Unchanged sequela of chronic small vessel ischemic disease and cerebral atrophy. Electronically Signed   By: Maurine Simmering M.D.   On: 07/04/2021 12:24   CT HEAD WO CONTRAST (5MM)  Result Date: 06/14/2021 CLINICAL DATA:  Head trauma, minor (Age >= 65y) EXAM: CT HEAD WITHOUT CONTRAST TECHNIQUE: Contiguous axial images were obtained from the base of the skull through the vertex without intravenous contrast. COMPARISON:  08/03/2020 FINDINGS: Brain: No evidence of acute infarction, hemorrhage, hydrocephalus, extra-axial collection or mass lesion/mass effect. Moderate low-density changes within the periventricular and subcortical white matter compatible with chronic microvascular ischemic change. Mild diffuse cerebral volume loss. Vascular: Atherosclerotic calcifications involving the large vessels of the skull base. No unexpected hyperdense vessel. Skull: Normal. Negative for fracture or focal lesion. Sinuses/Orbits: No acute finding. Other: Negative for scalp hematoma. IMPRESSION: 1. No acute intracranial findings. 2. Chronic microvascular ischemic change and cerebral volume loss. Electronically Signed   By: Davina Poke D.O.   On: 06/14/2021 13:05   CT HIP LEFT W CONTRAST  Result Date: 07/01/2021 CLINICAL DATA:  Tripped and fell, left hip pain EXAM: CT OF THE LOWER LEFT EXTREMITY WITH CONTRAST TECHNIQUE: Multidetector CT imaging of the lower left extremity was performed according to the standard protocol following intravenous contrast administration. CONTRAST:  163m OMNIPAQUE IOHEXOL 350 MG/ML SOLN COMPARISON:  07/01/2021 FINDINGS: Bones/Joint/Cartilage There are no acute displaced fractures. Mild joint space narrowing of the left hip compatible with osteoarthritis. Prominent spondylosis and facet hypertrophy at the lumbosacral junction. Ligaments Suboptimally assessed by CT.  Muscles and Tendons No gross abnormalities. Soft tissues There is a large hematoma in the subcutaneous tissues in the left gluteal region, measuring up to 13.3 x 6.1 cm in transverse dimension and extending approximately 18.1 cm in craniocaudal extent. The visualized intrapelvic structures are unremarkable. Scattered diverticulosis of the distal colon is noted without diverticulitis. Vascular structures enhance normally. Reconstructed images demonstrate no additional findings. IMPRESSION: 1. Large subcutaneous hematoma within the left gluteal region. 2. No acute displaced fractures. Electronically Signed   By: MRanda NgoM.D.   On: 07/01/2021 22:15   MR BRAIN WO CONTRAST  Result Date: 07/04/2021 CLINICAL DATA:  TIA, worsening mental status EXAM: MRI HEAD WITHOUT CONTRAST TECHNIQUE: Multiplanar, multiecho pulse sequences of the brain and surrounding structures were obtained without intravenous contrast. COMPARISON:  Same-day noncontrast CT head, brain MRI 12/03/2019 FINDINGS: Brain: There is no acute intracranial hemorrhage, extra-axial fluid collection, or acute infarct. There is a remote infarct in the left superior frontal gyrus near the vertex with associated SWI signal dropout, unchanged. An additional small remote infarct in the left temporal lobe is noted. Additional confluent FLAIR signal abnormality throughout the subcortical and periventricular white matter likely reflecting moderate chronic white matter microangiopathy is not significantly changed compared to the prior MRI. There is no mass lesion. The ventricles are stable in size. There is no midline shift. Vascular: Normal flow voids. Skull and upper cervical spine: Normal marrow signal. Sinuses/Orbits: The paranasal sinuses are clear. Bilateral lens implants are in place. The globes and orbits are otherwise unremarkable. Other: None. IMPRESSION: 1. No acute intracranial pathology. 2. Stable remote infarcts in the left superior frontal gyrus and  temporal lobe, and moderate chronic white matter microangiopathy. Electronically Signed   By: PValetta MoleM.D.   On: 07/04/2021 14:44   DG Shoulder Left  Result Date: 07/02/2021 CLINICAL DATA:  Intermittent left shoulder pain, recent fall EXAM: LEFT SHOULDER - 2+ VIEW COMPARISON:  03/09/2021 FINDINGS: Internal rotation, external rotation, and transscapular views of the left shoulder are obtained. No fracture, subluxation, or dislocation. Mild osteoarthritis of the acromioclavicular and glenohumeral joints. Visualized portions of the left chest are clear. IMPRESSION: 1. Mild osteoarthritis.  No acute bony abnormality. Electronically Signed   By: MRanda NgoM.D.   On: 07/02/2021 17:23   DG Knee Complete 4 Views Left  Result Date: 06/14/2021 CLINICAL DATA:  Left knee pain after fall. EXAM: LEFT KNEE - COMPLETE 4+ VIEW COMPARISON:  None. FINDINGS: Status post left total knee arthroplasty. No fracture, dislocation or effusion is noted. IMPRESSION: No acute abnormality seen. Electronically Signed   By: JMarijo ConceptionM.D.   On: 06/14/2021 12:46   DG Hip Unilat With Pelvis 2-3 Views Left  Result Date: 07/01/2021 CLINICAL DATA:  Fall and left hip pain. EXAM: DG HIP (WITH OR WITHOUT PELVIS) 2-3V LEFT COMPARISON:  None. FINDINGS: There is no acute fracture or dislocation. The bones are osteopenic. Mild bilateral hip and SI joint arthritic changes. There is soft tissue contusion the  lateral left hip. IMPRESSION: No acute fracture or dislocation. Electronically Signed   By: Anner Crete M.D.   On: 07/01/2021 20:15   DG Hip Unilat W or Wo Pelvis 2-3 Views Left  Result Date: 06/14/2021 CLINICAL DATA:  Left hip pain after fall. EXAM: DG HIP (WITH OR WITHOUT PELVIS) 2-3V LEFT COMPARISON:  None. FINDINGS: There is no evidence of hip fracture or dislocation. There is no evidence of arthropathy or other focal bone abnormality. IMPRESSION: Negative. Electronically Signed   By: Marijo Conception M.D.   On:  06/14/2021 12:45      Subjective: She does not have any significant pain.  Discharge Exam: Vitals:   07/05/21 1646 07/05/21 2130 07/06/21 0449 07/06/21 0749  BP: (!) 113/59 (!) 127/58 123/67 123/61  Pulse: 73 61 67 66  Resp: '17 19 19 18  '$ Temp: 98 F (36.7 C) 98.2 F (36.8 C) (!) 97.4 F (36.3 C) 98.9 F (37.2 C)  TempSrc:      SpO2: 99% 95% 94% 96%  Weight:      Height:        General: Pt is alert, awake, not in acute distress Cardiovascular: RRR, S1/S2 +, no rubs, no gallops Respiratory: CTA bilaterally, no wheezing, no rhonchi Abdominal: Soft, NT, ND, bowel sounds + Extremities: no edema, no cyanosis    The results of significant diagnostics from this hospitalization (including imaging, microbiology, ancillary and laboratory) are listed below for reference.     Microbiology: Recent Results (from the past 240 hour(s))  Resp Panel by RT-PCR (Flu A&B, Covid) Nasopharyngeal Swab     Status: None   Collection Time: 07/01/21 11:40 PM   Specimen: Nasopharyngeal Swab; Nasopharyngeal(NP) swabs in vial transport medium  Result Value Ref Range Status   SARS Coronavirus 2 by RT PCR NEGATIVE NEGATIVE Final    Comment: (NOTE) SARS-CoV-2 target nucleic acids are NOT DETECTED.  The SARS-CoV-2 RNA is generally detectable in upper respiratory specimens during the acute phase of infection. The lowest concentration of SARS-CoV-2 viral copies this assay can detect is 138 copies/mL. A negative result does not preclude SARS-Cov-2 infection and should not be used as the sole basis for treatment or other patient management decisions. A negative result may occur with  improper specimen collection/handling, submission of specimen other than nasopharyngeal swab, presence of viral mutation(s) within the areas targeted by this assay, and inadequate number of viral copies(<138 copies/mL). A negative result must be combined with clinical observations, patient history, and  epidemiological information. The expected result is Negative.  Fact Sheet for Patients:  EntrepreneurPulse.com.au  Fact Sheet for Healthcare Providers:  IncredibleEmployment.be  This test is no t yet approved or cleared by the [place] FDA and  has been authorized for detection and/or diagnosis of SARS-CoV-2 by FDA under an Emergency Use Authorization (EUA). This EUA will remain  in effect (meaning this test can be used) for the duration of the COVID-19 declaration under Section 564(b)(1) of the Act, 21 U.S.C.section 360bbb-3(b)(1), unless the authorization is terminated  or revoked sooner.       Influenza A by PCR NEGATIVE NEGATIVE Final   Influenza B by PCR NEGATIVE NEGATIVE Final    Comment: (NOTE) The Xpert Xpress SARS-CoV-2/FLU/RSV plus assay is intended as an aid in the diagnosis of influenza from Nasopharyngeal swab specimens and should not be used as a sole basis for treatment. Nasal washings and aspirates are unacceptable for Xpert Xpress SARS-CoV-2/FLU/RSV testing.  Fact Sheet for Patients: EntrepreneurPulse.com.au  Fact Sheet for  Healthcare Providers: IncredibleEmployment.be  This test is not yet approved or cleared by the [place] and has been authorized for detection and/or diagnosis of SARS-CoV-2 by FDA under an Emergency Use Authorization (EUA). This EUA will remain in effect (meaning this test can be used) for the duration of the COVID-19 declaration under Section 564(b)(1) of the Act, 21 U.S.C. section 360bbb-3(b)(1), unless the authorization is terminated or revoked.  Performed at [facility], [address] [place], [place] [address]   Resp Panel by RT-PCR (Flu A&B, Covid) Nasopharyngeal Swab     Status: None   Collection Time: 07/06/21 11:08 AM   Specimen: Nasopharyngeal Swab; Nasopharyngeal(NP) swabs in vial transport medium  Result Value Ref Range  Status   SARS Coronavirus 2 by RT PCR NEGATIVE NEGATIVE Final    Comment: (NOTE) SARS-CoV-2 target nucleic acids are NOT DETECTED.  The SARS-CoV-2 RNA is generally detectable in upper respiratory specimens during the acute phase of infection. The lowest concentration of SARS-CoV-2 viral copies this assay can detect is 138 copies/mL. A negative result does not preclude SARS-Cov-2 infection and should not be used as the sole basis for treatment or other patient management decisions. A negative result may occur with  improper specimen collection/handling, submission of specimen other than nasopharyngeal swab, presence of viral mutation(s) within the areas targeted by this assay, and inadequate number of viral copies(<138 copies/mL). A negative result must be combined with clinical observations, patient history, and epidemiological information. The expected result is Negative.  Fact Sheet for Patients:  EntrepreneurPulse.com.au  Fact Sheet for Healthcare Providers:  IncredibleEmployment.be  This test is no t yet approved or cleared by the [place] FDA and  has been authorized for detection and/or diagnosis of SARS-CoV-2 by FDA under an Emergency Use Authorization (EUA). This EUA will remain  in effect (meaning this test can be used) for the duration of the COVID-19 declaration under Section 564(b)(1) of the Act, 21 U.S.C.section 360bbb-3(b)(1), unless the authorization is terminated  or revoked sooner.       Influenza A by PCR NEGATIVE NEGATIVE Final   Influenza B by PCR NEGATIVE NEGATIVE Final    Comment: (NOTE) The Xpert Xpress SARS-CoV-2/FLU/RSV plus assay is intended as an aid in the diagnosis of influenza from Nasopharyngeal swab specimens and should not be used as a sole basis for treatment. Nasal washings and aspirates are unacceptable for Xpert Xpress SARS-CoV-2/FLU/RSV testing.  Fact Sheet for  Patients: EntrepreneurPulse.com.au  Fact Sheet for Healthcare Providers: IncredibleEmployment.be  This test is not yet approved or cleared by the [place] FDA and has been authorized for detection and/or diagnosis of SARS-CoV-2 by FDA under an Emergency Use Authorization (EUA). This EUA will remain in effect (meaning this test can be used) for the duration of the COVID-19 declaration under Section 564(b)(1) of the Act, 21 U.S.C. section 360bbb-3(b)(1), unless the authorization is terminated or revoked.  Performed at [facility], [address] [place], [place] [address]      Labs: BNP (last 3 results) No results for input(s): BNP in the last 8760 hours. Basic Metabolic Panel: Recent Labs  Lab 07/02/21 0529 07/03/21 0414 07/04/21 0640 07/05/21 0443 07/06/21 0550  NA 136 135 137 137 140  K 4.1 3.9 3.9 4.2 4.3  CL 107 106 107 109 110  CO2 '23 24 25 22 25  '$ GLUCOSE 121* 104* 99 148* 129*  BUN '19 14 19 17 22  '$ CREATININE 0.54 0.63 0.55 0.41* 0.53  CALCIUM 8.8* 8.3* 8.8*  9.3 9.4  MG  --  2.1 2.2 2.6* 2.8*   Liver Function Tests: No results for input(s): AST, ALT, ALKPHOS, BILITOT, PROT, ALBUMIN in the last 168 hours. No results for input(s): LIPASE, AMYLASE in the last 168 hours. No results for input(s): AMMONIA in the last 168 hours. CBC: Recent Labs  Lab 07/01/21 2114 07/01/21 2340 07/02/21 0529 07/02/21 1124 07/02/21 1719 07/03/21 0414 07/04/21 0640 07/05/21 0443 07/06/21 0550  WBC 11.1*  --  8.1  --   --  7.4 7.3 4.6 9.0  NEUTROABS 8.6*  --   --   --   --  4.6 4.2 3.8 7.4  HGB 10.8*   < > 9.1*   < > 9.0* 8.2* 8.2* 8.8* 9.0*  HCT 31.7*   < > 27.7*   < > 27.2* 24.7* 24.8* 25.7* 27.5*  MCV 101.3*  --  102.6*  --   --  103.8* 104.2* 103.6* 106.6*  PLT 185  --  159  --   --  146* 140* 142* 153   < > = values in this interval not displayed.   Cardiac Enzymes: No results for input(s): CKTOTAL, CKMB, CKMBINDEX,  TROPONINI in the last 168 hours. BNP: Invalid input(s): POCBNP CBG: No results for input(s): GLUCAP in the last 168 hours. D-Dimer No results for input(s): DDIMER in the last 72 hours. Hgb A1c No results for input(s): HGBA1C in the last 72 hours. Lipid Profile No results for input(s): CHOL, HDL, LDLCALC, TRIG, CHOLHDL, LDLDIRECT in the last 72 hours. Thyroid function studies No results for input(s): TSH, T4TOTAL, T3FREE, THYROIDAB in the last 72 hours.  Invalid input(s): FREET3 Anemia work up No results for input(s): VITAMINB12, FOLATE, FERRITIN, TIBC, IRON, RETICCTPCT in the last 72 hours. Urinalysis    Component Value Date/Time   COLORURINE YELLOW (A) 07/04/2021 2334   APPEARANCEUR CLEAR (A) 07/04/2021 2334   APPEARANCEUR Clear 01/19/2016 0000   LABSPEC 1.009 07/04/2021 2334   LABSPEC 1.005 08/24/2013 1727   PHURINE 8.0 07/04/2021 2334   GLUCOSEU NEGATIVE 07/04/2021 2334   GLUCOSEU Negative 08/24/2013 1727   HGBUR NEGATIVE 07/04/2021 2334   BILIRUBINUR NEGATIVE 07/04/2021 2334   BILIRUBINUR negative 10/15/2019 0916   BILIRUBINUR Negative 01/19/2016 0000   BILIRUBINUR Negative 08/24/2013 1727   KETONESUR NEGATIVE 07/04/2021 2334   PROTEINUR NEGATIVE 07/04/2021 2334   UROBILINOGEN 0.2 10/15/2019 0916   NITRITE NEGATIVE 07/04/2021 2334   LEUKOCYTESUR NEGATIVE 07/04/2021 2334   LEUKOCYTESUR Trace 08/24/2013 1727   Sepsis Labs Invalid input(s): PROCALCITONIN,  WBC,  LACTICIDVEN Microbiology Recent Results (from the past 240 hour(s))  Resp Panel by RT-PCR (Flu A&B, Covid) Nasopharyngeal Swab     Status: None   Collection Time: 07/01/21 11:40 PM   Specimen: Nasopharyngeal Swab; Nasopharyngeal(NP) swabs in vial transport medium  Result Value Ref Range Status   SARS Coronavirus 2 by RT PCR NEGATIVE NEGATIVE Final    Comment: (NOTE) SARS-CoV-2 target nucleic acids are NOT DETECTED.  The SARS-CoV-2 RNA is generally detectable in upper respiratory specimens during the acute  phase of infection. The lowest concentration of SARS-CoV-2 viral copies this assay can detect is 138 copies/mL. A negative result does not preclude SARS-Cov-2 infection and should not be used as the sole basis for treatment or other patient management decisions. A negative result may occur with  improper specimen collection/handling, submission of specimen other than nasopharyngeal swab, presence of viral mutation(s) within the areas targeted by this assay, and inadequate number of viral copies(<138 copies/mL). A negative result  must be combined with clinical observations, patient history, and epidemiological information. The expected result is Negative.  Fact Sheet for Patients:  EntrepreneurPulse.com.au  Fact Sheet for Healthcare Providers:  IncredibleEmployment.be  This test is no t yet approved or cleared by the [place] FDA and  has been authorized for detection and/or diagnosis of SARS-CoV-2 by FDA under an Emergency Use Authorization (EUA). This EUA will remain  in effect (meaning this test can be used) for the duration of the COVID-19 declaration under Section 564(b)(1) of the Act, 21 U.S.C.section 360bbb-3(b)(1), unless the authorization is terminated  or revoked sooner.       Influenza A by PCR NEGATIVE NEGATIVE Final   Influenza B by PCR NEGATIVE NEGATIVE Final    Comment: (NOTE) The Xpert Xpress SARS-CoV-2/FLU/RSV plus assay is intended as an aid in the diagnosis of influenza from Nasopharyngeal swab specimens and should not be used as a sole basis for treatment. Nasal washings and aspirates are unacceptable for Xpert Xpress SARS-CoV-2/FLU/RSV testing.  Fact Sheet for Patients: EntrepreneurPulse.com.au  Fact Sheet for Healthcare Providers: IncredibleEmployment.be  This test is not yet approved or cleared by the [place] FDA and has been authorized for detection and/or diagnosis of  SARS-CoV-2 by FDA under an Emergency Use Authorization (EUA). This EUA will remain in effect (meaning this test can be used) for the duration of the COVID-19 declaration under Section 564(b)(1) of the Act, 21 U.S.C. section 360bbb-3(b)(1), unless the authorization is terminated or revoked.  Performed at [facility], [address] [place], [place] [address]   Resp Panel by RT-PCR (Flu A&B, Covid) Nasopharyngeal Swab     Status: None   Collection Time: 07/06/21 11:08 AM   Specimen: Nasopharyngeal Swab; Nasopharyngeal(NP) swabs in vial transport medium  Result Value Ref Range Status   SARS Coronavirus 2 by RT PCR NEGATIVE NEGATIVE Final    Comment: (NOTE) SARS-CoV-2 target nucleic acids are NOT DETECTED.  The SARS-CoV-2 RNA is generally detectable in upper respiratory specimens during the acute phase of infection. The lowest concentration of SARS-CoV-2 viral copies this assay can detect is 138 copies/mL. A negative result does not preclude SARS-Cov-2 infection and should not be used as the sole basis for treatment or other patient management decisions. A negative result may occur with  improper specimen collection/handling, submission of specimen other than nasopharyngeal swab, presence of viral mutation(s) within the areas targeted by this assay, and inadequate number of viral copies(<138 copies/mL). A negative result must be combined with clinical observations, patient history, and epidemiological information. The expected result is Negative.  Fact Sheet for Patients:  EntrepreneurPulse.com.au  Fact Sheet for Healthcare Providers:  IncredibleEmployment.be  This test is no t yet approved or cleared by the [place] FDA and  has been authorized for detection and/or diagnosis of SARS-CoV-2 by FDA under an Emergency Use Authorization (EUA). This EUA will remain  in effect (meaning this test can be used) for the duration of  the COVID-19 declaration under Section 564(b)(1) of the Act, 21 U.S.C.section 360bbb-3(b)(1), unless the authorization is terminated  or revoked sooner.       Influenza A by PCR NEGATIVE NEGATIVE Final   Influenza B by PCR NEGATIVE NEGATIVE Final    Comment: (NOTE) The Xpert Xpress SARS-CoV-2/FLU/RSV plus assay is intended as an aid in the diagnosis of influenza from Nasopharyngeal swab specimens and should not be used as a sole basis for treatment. Nasal washings and aspirates are unacceptable for Xpert Xpress SARS-CoV-2/FLU/RSV testing.  Fact Sheet  for Patients: EntrepreneurPulse.com.au  Fact Sheet for Healthcare Providers: IncredibleEmployment.be  This test is not yet approved or cleared by the [place] FDA and has been authorized for detection and/or diagnosis of SARS-CoV-2 by FDA under an Emergency Use Authorization (EUA). This EUA will remain in effect (meaning this test can be used) for the duration of the COVID-19 declaration under Section 564(b)(1) of the Act, 21 U.S.C. section 360bbb-3(b)(1), unless the authorization is terminated or revoked.  Performed at [facility], [address] [place], [place] [address]      Time coordinating discharge: 38mns  SIGNED:   JKathie Dike MD  [facility] 07/06/2021, 1:24 PM   If 7PM-7AM, please contact night-coverage [email or web address]

## 2021-07-06 NOTE — TOC Progression Note (Addendum)
Transition of Care [facility]) - Progression Note    Patient Details  Name: [name] [name] MRN: [id number] Date of Birth: 1941-03-14  Transition of Care [facility]) CM/SW [place], RN Phone Number: 07/06/2021, 10:36 AM  Clinical Narrative:    Received notification from [place] with insurance approval, [id number], [facility] would like the family to come at 1 PM to do the paperwork, [facility] is to call me back once they see if they have a bed today with the patient not having the covid booster, the patient will have to go into a 14 day quarantine, [facility] called and they do have a bed today, the daughter is aware, Needs a covid teswt and dc summary Daughter will transport      Expected Discharge Plan and Services                                                 Social Determinants of Health (SDOH) Interventions    Readmission Risk Interventions Readmission Risk Prevention Plan 03/12/2019  Transportation Screening Complete  Some recent data might be hidden

## 2021-07-06 NOTE — Plan of Care (Signed)
Patient sleeping between care. Oriented x4. No complaints of pain or discomfort. No new changes in assessment. Call bell within reach.  PLAN OF CARE ONGOING Problem: Education: Goal: Knowledge of General Education information will improve Description: Including pain rating scale, medication(s)/side effects and non-pharmacologic comfort measures Outcome: Progressing   Problem: Health Behavior/Discharge Planning: Goal: Ability to manage health-related needs will improve Outcome: Progressing   Problem: Clinical Measurements: Goal: Ability to maintain clinical measurements within normal limits will improve Outcome: Progressing Goal: Will remain free from infection Outcome: Progressing Goal: Diagnostic test results will improve Outcome: Progressing Goal: Respiratory complications will improve Outcome: Progressing Goal: Cardiovascular complication will be avoided Outcome: Progressing   Problem: Activity: Goal: Risk for activity intolerance will decrease Outcome: Progressing   Problem: Nutrition: Goal: Adequate nutrition will be maintained Outcome: Progressing   Problem: Coping: Goal: Level of anxiety will decrease Outcome: Progressing   Problem: Elimination: Goal: Will not experience complications related to bowel motility Outcome: Progressing Goal: Will not experience complications related to urinary retention Outcome: Progressing   Problem: Pain Managment: Goal: General experience of comfort will improve Outcome: Progressing   Problem: Safety: Goal: Ability to remain free from injury will improve Outcome: Progressing   Problem: Skin Integrity: Goal: Risk for impaired skin integrity will decrease Outcome: Progressing

## 2021-07-07 ENCOUNTER — Ambulatory Visit: Payer: Self-pay | Admitting: *Deleted

## 2021-07-07 DIAGNOSIS — I10 Essential (primary) hypertension: Secondary | ICD-10-CM | POA: Diagnosis not present

## 2021-07-07 DIAGNOSIS — I639 Cerebral infarction, unspecified: Secondary | ICD-10-CM | POA: Diagnosis not present

## 2021-07-07 DIAGNOSIS — R569 Unspecified convulsions: Secondary | ICD-10-CM | POA: Diagnosis not present

## 2021-07-07 DIAGNOSIS — I482 Chronic atrial fibrillation, unspecified: Secondary | ICD-10-CM | POA: Diagnosis not present

## 2021-07-07 DIAGNOSIS — M810 Age-related osteoporosis without current pathological fracture: Secondary | ICD-10-CM | POA: Diagnosis not present

## 2021-07-07 DIAGNOSIS — W19XXXD Unspecified fall, subsequent encounter: Secondary | ICD-10-CM | POA: Diagnosis not present

## 2021-07-07 DIAGNOSIS — K5900 Constipation, unspecified: Secondary | ICD-10-CM | POA: Diagnosis not present

## 2021-07-07 DIAGNOSIS — M25552 Pain in left hip: Secondary | ICD-10-CM | POA: Diagnosis not present

## 2021-07-07 DIAGNOSIS — G629 Polyneuropathy, unspecified: Secondary | ICD-10-CM | POA: Diagnosis not present

## 2021-07-07 NOTE — Telephone Encounter (Signed)
Patient called to inform PCP she had multiple falls and was hospitalized in August and is now at [facility] facility. Patent is unsure when she will be discharged back home and is currently using a walker. Patient wanted to inform PCP to keep him up to date.

## 2021-07-07 NOTE — Telephone Encounter (Signed)
Answer Assessment - Initial Assessment Questions 1. MECHANISM: "How did the fall happen?"     Fell outside approx. August 22  2. DOMESTIC VIOLENCE AND ELDER ABUSE SCREENING: "Did you fall because someone pushed you or tried to hurt you?" If Yes, ask: "Are you safe now?"     na 3. ONSET: "When did the fall happen?" (e.g., minutes, hours, or days ago)     In August was hospitalized and now in [facility] rehab facility  4. LOCATION: "What part of the body hit the ground?" (e.g., back, buttocks, head, hips, knees, hands, head, stomach)     na 5. INJURY: "Did you hurt (injure) yourself when you fell?" If Yes, ask: "What did you injure? Tell me more about this?" (e.g., body area; type of injury; pain severity)"     na 6. PAIN: "Is there any pain?" If Yes, ask: "How bad is the pain?" (e.g., Scale 1-10; or mild,  moderate, severe)   - NONE (0): No pain   - MILD (1-3): Doesn't interfere with normal activities    - MODERATE (4-7): Interferes with normal activities or awakens from sleep    - SEVERE (8-10): Excruciating pain, unable to do any normal activities      na 7. SIZE: For cuts, bruises, or swelling, ask: "How large is it?" (e.g., inches or centimeters)      na 8. PREGNANCY: "Is there any chance you are pregnant?" "When was your last menstrual period?"     na 9. OTHER SYMPTOMS: "Do you have any other symptoms?" (e.g., dizziness, fever, weakness; new onset or worsening).      na 10. CAUSE: "What do you think caused the fall (or falling)?" (e.g., tripped, dizzy spell)       na  Protocols used: Falls and [facility]

## 2021-07-08 DIAGNOSIS — R569 Unspecified convulsions: Secondary | ICD-10-CM | POA: Diagnosis not present

## 2021-07-08 DIAGNOSIS — R269 Unspecified abnormalities of gait and mobility: Secondary | ICD-10-CM | POA: Diagnosis not present

## 2021-07-08 DIAGNOSIS — M25552 Pain in left hip: Secondary | ICD-10-CM | POA: Diagnosis not present

## 2021-07-08 DIAGNOSIS — I482 Chronic atrial fibrillation, unspecified: Secondary | ICD-10-CM | POA: Diagnosis not present

## 2021-07-08 DIAGNOSIS — M25512 Pain in left shoulder: Secondary | ICD-10-CM | POA: Diagnosis not present

## 2021-07-08 DIAGNOSIS — I4891 Unspecified atrial fibrillation: Secondary | ICD-10-CM | POA: Diagnosis not present

## 2021-07-08 DIAGNOSIS — T148XXA Other injury of unspecified body region, initial encounter: Secondary | ICD-10-CM | POA: Diagnosis not present

## 2021-07-08 DIAGNOSIS — I10 Essential (primary) hypertension: Secondary | ICD-10-CM | POA: Diagnosis not present

## 2021-07-08 DIAGNOSIS — N3281 Overactive bladder: Secondary | ICD-10-CM | POA: Diagnosis not present

## 2021-07-15 DIAGNOSIS — I10 Essential (primary) hypertension: Secondary | ICD-10-CM | POA: Diagnosis not present

## 2021-07-15 DIAGNOSIS — I482 Chronic atrial fibrillation, unspecified: Secondary | ICD-10-CM | POA: Diagnosis not present

## 2021-07-15 DIAGNOSIS — G629 Polyneuropathy, unspecified: Secondary | ICD-10-CM | POA: Diagnosis not present

## 2021-07-15 DIAGNOSIS — M25552 Pain in left hip: Secondary | ICD-10-CM | POA: Diagnosis not present

## 2021-07-18 DIAGNOSIS — W19XXXD Unspecified fall, subsequent encounter: Secondary | ICD-10-CM | POA: Diagnosis not present

## 2021-07-18 DIAGNOSIS — I482 Chronic atrial fibrillation, unspecified: Secondary | ICD-10-CM | POA: Diagnosis not present

## 2021-07-18 DIAGNOSIS — M25552 Pain in left hip: Secondary | ICD-10-CM | POA: Diagnosis not present

## 2021-07-18 DIAGNOSIS — G629 Polyneuropathy, unspecified: Secondary | ICD-10-CM | POA: Diagnosis not present

## 2021-07-19 DIAGNOSIS — G629 Polyneuropathy, unspecified: Secondary | ICD-10-CM | POA: Diagnosis not present

## 2021-07-19 DIAGNOSIS — M25552 Pain in left hip: Secondary | ICD-10-CM | POA: Diagnosis not present

## 2021-07-19 DIAGNOSIS — I482 Chronic atrial fibrillation, unspecified: Secondary | ICD-10-CM | POA: Diagnosis not present

## 2021-07-19 DIAGNOSIS — M25512 Pain in left shoulder: Secondary | ICD-10-CM | POA: Diagnosis not present

## 2021-07-21 DIAGNOSIS — M25552 Pain in left hip: Secondary | ICD-10-CM | POA: Diagnosis not present

## 2021-07-21 DIAGNOSIS — I482 Chronic atrial fibrillation, unspecified: Secondary | ICD-10-CM | POA: Diagnosis not present

## 2021-07-21 DIAGNOSIS — M25512 Pain in left shoulder: Secondary | ICD-10-CM | POA: Diagnosis not present

## 2021-07-21 DIAGNOSIS — G629 Polyneuropathy, unspecified: Secondary | ICD-10-CM | POA: Diagnosis not present

## 2021-08-18 ENCOUNTER — Telehealth: Payer: Self-pay

## 2021-08-18 NOTE — Telephone Encounter (Signed)
Copied from [place] [id number]. Topic: Medical Record Request - Other >> Aug 17, 2021  6:25 PM Pawlus, [name] [name] wrote: Pt stated she does not use MyChart and wanted to know if her latest lab results could be mailed to her house. Please advise.

## 2021-08-18 NOTE — Telephone Encounter (Signed)
Copied from [place] [id number]. Topic: Medical Record Request - Other >> Aug 17, 2021  6:25 PM Pawlus, Brayton Layman A wrote: Pt stated she does not use MyChart and wanted to know if her latest lab results could be mailed to her house. Please advise.

## 2021-08-19 NOTE — Telephone Encounter (Signed)
I called and spoke with patient. She needs a copy of her last labs faxed to another doctor. We did not refer patient to this bone doctor so I advised patient that she needs to come in and sign a ROI form for [place] to send records, or have the other doctor send [place] a signed ROI form.Patient verbalized understanding.

## 2021-08-22 ENCOUNTER — Other Ambulatory Visit: Payer: Self-pay | Admitting: Family Medicine

## 2021-08-22 NOTE — Telephone Encounter (Signed)
Requested Prescriptions  Pending Prescriptions Disp Refills  . oxybutynin (DITROPAN) 5 MG tablet [Pharmacy Med Name: Oxybutynin Chloride 5 MG Oral Tablet] 180 tablet 2    Sig: TAKE 1 TABLET BY MOUTH  TWICE DAILY     Urology:  Bladder Agents Passed - 08/22/2021 10:14 PM      Passed - Valid encounter within last 12 months    Recent Outpatient Visits          4 months ago Chronic midline back pain, unspecified back location   [place], MD   5 months ago Left hip pain   [facility] Birdie Sons, MD   6 months ago No-show for appointment   [facility] Birdie Sons, MD   6 months ago Bronchitis   [facility] Birdie Sons, MD   11 months ago Acute pain of both knees   [facility] Birdie Sons, MD

## 2021-08-23 ENCOUNTER — Telehealth: Payer: Self-pay

## 2021-08-23 NOTE — Telephone Encounter (Signed)
Please review.  Did you get a release form?  Will she need an office visit?  Thanks,   -Mickel Baas

## 2021-08-23 NOTE — Telephone Encounter (Signed)
Copied from [place] [phone]. Topic: Medical Record Request - Other >> Aug 23, 2021 11:21 AM Oneta Rack wrote: [name] [name] would like to know if PCP received a medical release form from Dr. Ladonna Snide orthopedic surgeon office. Release form was faxed yesterday. Please note when received, patient would like a follow up call

## 2021-08-25 ENCOUNTER — Telehealth: Payer: Self-pay

## 2021-08-25 NOTE — Telephone Encounter (Signed)
Copied from [place] [phone]. Topic: Medical Record Request - Other >> Aug 23, 2021 11:21 AM Oneta Rack wrote: [name] [name] would like to know if PCP received a medical release form from Dr. Ladonna Snide orthopedic surgeon office. Release form was faxed yesterday. Please note when received, patient would like a follow up call >> Aug 25, 2021 10:05 AM Tessa Lerner A wrote: The patient is scheduled to see Dr. Ladonna Snide on 08/30/21  Please contact further when possible

## 2021-08-26 NOTE — Telephone Encounter (Signed)
Haven't seen it 

## 2021-08-29 NOTE — Telephone Encounter (Signed)
Did you receive this fax?  Thanks,   -Mickel Baas

## 2021-08-30 DIAGNOSIS — M4180 Other forms of scoliosis, site unspecified: Secondary | ICD-10-CM | POA: Diagnosis not present

## 2021-08-30 DIAGNOSIS — M25552 Pain in left hip: Secondary | ICD-10-CM | POA: Diagnosis not present

## 2021-08-30 NOTE — Telephone Encounter (Signed)
[name] [name] did release before she left , Im not fully trained to do release. Not sure how that's working.. but no I dont remember the name.

## 2021-08-31 ENCOUNTER — Other Ambulatory Visit: Payer: Self-pay | Admitting: Family Medicine

## 2021-08-31 DIAGNOSIS — I10 Essential (primary) hypertension: Secondary | ICD-10-CM

## 2021-09-01 NOTE — Telephone Encounter (Signed)
Requested Prescriptions  Pending Prescriptions Disp Refills  . metoprolol succinate (TOPROL-XL) 25 MG 24 hr tablet [Pharmacy Med Name: Metoprolol Succinate ER 25 MG Oral Tablet Extended Release 24 Hour] 45 tablet 0    Sig: TAKE ONE-HALF TABLET BY  MOUTH DAILY     Cardiovascular:  Beta Blockers Passed - 08/31/2021 10:10 PM      Passed - Last BP in normal range    BP Readings from Last 1 Encounters:  07/06/21 123/61         Passed - Last Heart Rate in normal range    Pulse Readings from Last 1 Encounters:  07/06/21 66         Passed - Valid encounter within last 6 months    Recent Outpatient Visits          4 months ago Chronic midline back pain, unspecified back location   [facility] Birdie Sons, MD   5 months ago Left hip pain   [facility] Birdie Sons, MD   6 months ago No-show for appointment   [facility] Birdie Sons, MD   6 months ago [place] Birdie Sons, MD   11 months ago Acute pain of both [place] Birdie Sons, MD             . diltiazem (CARDIZEM CD) 240 MG 24 hr capsule [Pharmacy Med Name: DILTIAZEM  240MG   CAP  24 HR CD] 90 capsule 0    Sig: TAKE 1 CAPSULE BY MOUTH  DAILY     Cardiovascular:  Calcium Channel Blockers Passed - 08/31/2021 10:10 PM      Passed - Last BP in normal range    BP Readings from Last 1 Encounters:  07/06/21 123/61         Passed - Valid encounter within last 6 months    Recent Outpatient Visits          4 months ago Chronic midline back pain, unspecified back location   [place], MD   5 months ago Left hip pain   [facility] Birdie Sons, MD   6 months ago No-show for appointment   [facility] Birdie Sons, MD   6 months ago Bronchitis   [facility] Birdie Sons, MD   11 months ago Acute pain of both knees    [facility] Birdie Sons, MD

## 2021-09-06 DIAGNOSIS — W19XXXA Unspecified fall, initial encounter: Secondary | ICD-10-CM | POA: Diagnosis not present

## 2021-09-06 DIAGNOSIS — M75122 Complete rotator cuff tear or rupture of left shoulder, not specified as traumatic: Secondary | ICD-10-CM | POA: Diagnosis not present

## 2021-09-06 DIAGNOSIS — M25512 Pain in left shoulder: Secondary | ICD-10-CM | POA: Diagnosis not present

## 2021-09-12 DIAGNOSIS — M5440 Lumbago with sciatica, unspecified side: Secondary | ICD-10-CM | POA: Diagnosis not present

## 2021-09-12 DIAGNOSIS — M5136 Other intervertebral disc degeneration, lumbar region: Secondary | ICD-10-CM | POA: Diagnosis not present

## 2021-09-13 ENCOUNTER — Ambulatory Visit: Payer: Self-pay

## 2021-09-13 NOTE — Telephone Encounter (Signed)
Pt has upcoming MRI and wants to know when she needs to stop taking the Xarelto. Please advise pt.         Reason for Disposition  [1] Caller has NON-URGENT medicine question about med that PCP prescribed AND [2] triager unable to answer question  Answer Assessment - Initial Assessment Questions 1. NAME of MEDICATION: "What medicine are you calling about?"     xarelto 2. QUESTION: "What is your question?" (e.g., double dose of medicine, side effect)     When does she need to stop the xarelto before her upcoming MRI. 3. PRESCRIBING HCP: "Who prescribed it?" Reason: if prescribed by specialist, call should be referred to that group.     PCP 4. SYMPTOMS: "Do you have any symptoms?"     N/a 5. SEVERITY: If symptoms are present, ask "Are they mild, moderate or severe?"     N/a 6. PREGNANCY:  "Is there any chance that you are pregnant?" "When was your last menstrual period?"     N/a  Protocols used: Medication Question Call-A-AH

## 2021-09-13 NOTE — Telephone Encounter (Signed)
Pt calling and is requesting to know how long she is able to be off of Xarelto for a possible upcoming MRI. Please advise.   Left message to call back.

## 2021-09-13 NOTE — Telephone Encounter (Signed)
You don't need to stop Xarelto for an MRI. I don't see an MRI ordered in her record. If it is a different test then she needs to contact the ordering physician.

## 2021-09-13 NOTE — Telephone Encounter (Signed)
Left detailed message with advise as below.

## 2021-09-21 ENCOUNTER — Ambulatory Visit: Payer: Self-pay | Admitting: *Deleted

## 2021-09-21 DIAGNOSIS — S33130A Subluxation of L3/L4 lumbar vertebra, initial encounter: Secondary | ICD-10-CM | POA: Diagnosis not present

## 2021-09-21 DIAGNOSIS — M5136 Other intervertebral disc degeneration, lumbar region: Secondary | ICD-10-CM | POA: Diagnosis not present

## 2021-09-21 DIAGNOSIS — M4186 Other forms of scoliosis, lumbar region: Secondary | ICD-10-CM | POA: Diagnosis not present

## 2021-09-21 NOTE — Telephone Encounter (Signed)
Patient and sister [name], on [place], called, left VM to return the call to the office for a message from Dr. Caryn Section. Message in the 09/13/21 triage encounter about Xarelto.   Unable to reach patient after 3 attempts by [facility] NT, routing to the provider for resolution per protocol.     Summary: Pt needing clinical call answered form last wk   Pt calling and is requesting to know how long she is able to be off of Xarelto for a possible upcoming MRI. Please advise update this clinical call from 11/22 pt says has not heard back.   Now pt states that she has questions re how much gabapentin (NEURONTIN) 300 MG capsule she should be taking? Thinks the amount is getting her confused. Call back  MRI today and thinks may need operation. FU [phone] or her sister [name] [phone]  Medication

## 2021-09-21 NOTE — Telephone Encounter (Signed)
Pt calling and is requesting to know how long she is able to be off of Xarelto for a possible upcoming MRI. Please advise update this clinical call from 11/22 pt says has not heard back.   Now pt states that she has questions re how much gabapentin (NEURONTIN) 300 MG capsule she should be taking? Thinks the amount is getting her confused. Call back  MRI today and thinks may need operation. FU [phone] or her sister [name] [phone]  Medication   Called patient to review message from PCP on 09/13/21 regarding xarelto and review confusion symptoms regarding gabapentin. No answer, left message on voicemail to call clinic back at [phone].

## 2021-09-21 NOTE — Telephone Encounter (Signed)
Pt called back in, states she had questions on Xarelto for procedure or surgery. Advised her that whoever the provider is that will be doing the procedure should give her instructions on the Xarelto otherwise not to stop it. As far as the gabapentin she is concerned because she thinks its messing with her brain and thinking ability. She wants the dose decreased or stopped all together. Advised her I will send this to Dr. Sabino Snipes nurse and she can give a call to the pt if need be. No other questions/concerns noted.   Reason for Disposition  [1] Caller requesting NON-URGENT health information AND [2] PCP's office is the best resource  Answer Assessment - Initial Assessment Questions 1. REASON FOR CALL or QUESTION: "What is your reason for calling today?" or "How can I best help you?" or "What question do you have that I can help answer?"    Gabapentin wants to decrease dose because she feels its messing with her thinking. Or she will be ok stopping it all together.  Protocols used: Information Only Call - No Triage-A-AH

## 2021-09-22 ENCOUNTER — Ambulatory Visit: Payer: Self-pay | Admitting: *Deleted

## 2021-09-22 DIAGNOSIS — M47816 Spondylosis without myelopathy or radiculopathy, lumbar region: Secondary | ICD-10-CM | POA: Diagnosis not present

## 2021-09-22 DIAGNOSIS — M4186 Other forms of scoliosis, lumbar region: Secondary | ICD-10-CM | POA: Diagnosis not present

## 2021-09-22 DIAGNOSIS — M48061 Spinal stenosis, lumbar region without neurogenic claudication: Secondary | ICD-10-CM | POA: Diagnosis not present

## 2021-09-22 DIAGNOSIS — M5127 Other intervertebral disc displacement, lumbosacral region: Secondary | ICD-10-CM | POA: Diagnosis not present

## 2021-09-22 MED ORDER — GABAPENTIN 100 MG PO CAPS: 300.0000 mg | ORAL_CAPSULE | Freq: Two times a day (BID) | ORAL | 1 refills | Status: DC

## 2021-09-22 NOTE — Telephone Encounter (Signed)
Reason for Disposition  [1] Caller requesting NON-URGENT health information AND [2] PCP's office is the best resource  Answer Assessment - Initial Assessment Questions 1. REASON FOR CALL or QUESTION: "What is your reason for calling today?" or "How can I best help you?" or "What question do you have that I can help answer?"     How long will patient need to stop taking xarelto if she is required to have a surgery?  Protocols used: Information Only Call - No Triage-A-AH

## 2021-09-22 NOTE — Telephone Encounter (Signed)
Can reduced dose to 100mg , have sent prescription for 100mg  capsules to [facility].

## 2021-09-22 NOTE — Telephone Encounter (Signed)
Do not take Eliquis for two days (the day before and two days) before the surgery.

## 2021-09-22 NOTE — Addendum Note (Signed)
Addended by: Birdie Sons on: 09/22/2021 06:11 PM   Modules accepted: Orders

## 2021-09-22 NOTE — Telephone Encounter (Signed)
Patient's sister [name] on [place] , called to ask if a surgery is required for the patient in the future, how many days prior to the surgery will the patient need to stop taking xarelto? Patient had recent MRI yesterday and does not know results. Please advise.

## 2021-09-23 ENCOUNTER — Telehealth: Payer: Self-pay

## 2021-09-23 NOTE — Telephone Encounter (Signed)
[name] advised. (On DPR)  Thanks,   -Matika Bartell

## 2021-09-23 NOTE — Telephone Encounter (Signed)
Copied from [place] [id number]. Topic: Quick Communication - Rx Refill/Question >> Sep 23, 2021 11:54 AM Pawlus, [name] [name] wrote: Pt stated she needs the gabapentin (NEURONTIN) 100 MG capsule sent to her mail order pharamcy- [facility] ([place], [place], please advise

## 2021-09-23 NOTE — Telephone Encounter (Signed)
Please review it looks like [facility] KW

## 2021-09-23 NOTE — Telephone Encounter (Signed)
I spoke with pt.  She states this has been resolved.  Thanks,   -Mickel Baas

## 2021-09-29 DIAGNOSIS — M5416 Radiculopathy, lumbar region: Secondary | ICD-10-CM | POA: Diagnosis not present

## 2021-09-29 DIAGNOSIS — M5136 Other intervertebral disc degeneration, lumbar region: Secondary | ICD-10-CM | POA: Diagnosis not present

## 2021-09-29 DIAGNOSIS — M48062 Spinal stenosis, lumbar region with neurogenic claudication: Secondary | ICD-10-CM | POA: Diagnosis not present

## 2021-09-30 ENCOUNTER — Ambulatory Visit (INDEPENDENT_AMBULATORY_CARE_PROVIDER_SITE_OTHER): Payer: Medicare Other | Admitting: Family Medicine

## 2021-09-30 DIAGNOSIS — Z91199 Patient's noncompliance with other medical treatment and regimen due to unspecified reason: Secondary | ICD-10-CM

## 2021-09-30 NOTE — Progress Notes (Signed)
cancelled

## 2021-10-03 ENCOUNTER — Ambulatory Visit (INDEPENDENT_AMBULATORY_CARE_PROVIDER_SITE_OTHER): Payer: Medicare Other | Admitting: Family Medicine

## 2021-10-03 DIAGNOSIS — M4807 Spinal stenosis, lumbosacral region: Secondary | ICD-10-CM

## 2021-10-03 NOTE — Progress Notes (Signed)
Virtual telephone visit    Virtual Visit via Telephone Note   This visit type was conducted due to national recommendations for restrictions regarding the COVID-19 Pandemic (e.g. social distancing) in an effort to limit this patient's exposure and mitigate transmission in our community. Due to her co-morbid illnesses, this patient is at least at moderate risk for complications without adequate follow up. This format is felt to be most appropriate for this patient at this time. The patient did not have access to video technology or had technical difficulties with video requiring transitioning to audio format only (telephone). Physical exam was limited to content and character of the telephone converstion.    Patient location: car Provider location: [facility]  I discussed the limitations of evaluation and management by telemedicine and the availability of in person appointments. The patient expressed understanding and agreed to proceed.   Visit Date: 10/03/2021  Today's healthcare provider: Lelon Huh, MD   No chief complaint on file.  Subjective    HPI  Follow up for chronic pain  Changes made at last visit include increasing gabapentin 300mg  twice a day which she states has not helped. Pt would like to discuss going back to 100mg  twice a day. She has no other complaints today.   She reports excellent compliance with treatment. She feels that condition is Unchanged. She is having side effects.   -----------------------------------------------------------------------------------------     Medications: Outpatient Medications Prior to Visit  Medication Sig   acetaminophen (TYLENOL) 650 MG CR tablet Take 650 mg by mouth every 8 (eight) hours as needed for pain.   alendronate (FOSAMAX) 70 MG tablet TAKE 1 TABLET BY MOUTH WEEKLY IN THE MORNING WITH A FULL GLASS OF WATER. DO NOT EAT, DRINK OR LIE DOWN FOR 30 MIN.   Artificial Tear Solution (SOOTHE XP XTRA PROTECTION) SOLN Apply 1  drop to eye as needed (both eyes).    B Complex-Biotin-FA (EQL B COMPLEX 100) TABS daily.    Biotin 10 MG CAPS Take by mouth daily.   bisacodyl (DULCOLAX) 5 MG EC tablet Take 5 mg by mouth daily as needed for moderate constipation.   Calcium-Magnesium-Vitamin D (CALCIUM 1200+D3 PO) Take 1,200 mg by mouth daily. Citracal ER   cholecalciferol (VITAMIN D) 1000 units tablet Take 1,000 Units by mouth daily.   Coenzyme Q10 (CO Q-10) 100 MG CAPS Take 100 mg by mouth daily.    diltiazem (CARDIZEM CD) 240 MG 24 hr capsule TAKE 1 CAPSULE BY MOUTH  DAILY   gabapentin (NEURONTIN) 100 MG capsule Take 3 capsules (300 mg total) by mouth 2 (two) times daily. Take in place of the 300mg  capsules   Lactobacillus (ACIDOPHILUS PO) Take 1 tablet by mouth at bedtime.    lidocaine (LIDODERM) 5 % Place 1 patch onto the skin daily. Remove & Discard patch within 12 hours   MEGARED OMEGA-3 KRILL OIL PO Take 300 mg by mouth daily.    Menthol, Topical Analgesic, (ICY HOT BACK EX) Apply topically as needed.   metoprolol succinate (TOPROL-XL) 25 MG 24 hr tablet TAKE ONE-HALF TABLET BY  MOUTH DAILY   oxybutynin (DITROPAN) 5 MG tablet TAKE 1 TABLET BY MOUTH  TWICE DAILY   rivaroxaban (XARELTO) 20 MG TABS tablet TAKE 1 TABLET BY MOUTH DAILY WITH SUPPER   traMADol (ULTRAM) 50 MG tablet Take 1 tablet (50 mg total) by mouth every 6 (six) hours as needed.   TURMERIC PO Take 800 mg by mouth 2 (two) times daily.   Zinc 50  MG CAPS Take 25 mg by mouth daily.    No facility-administered medications prior to visit.    Review of Systems  Constitutional: Negative.   Respiratory: Negative.    Cardiovascular: Negative.   Gastrointestinal: Negative.   Musculoskeletal:  Positive for arthralgias. Negative for gait problem, joint swelling, myalgias, neck pain and neck stiffness.  Neurological:  Negative for dizziness, light-headedness and headaches.     Objective    There were no vitals taken for this visit.  Awake, alert, oriented  x 3. In no apparent distress    Assessment & Plan     1. Spinal stenosis of lumbosacral region She feels that gabapentin is making her more forgetful and would like to go back down to 100mg  capsules. A prescription for 100mg  capsules was sent to [facility] on 09-22-2021.  However the dispense history indicates 300mg  capsules were mailed to her last week. She has recently moved to [place] so prescription is probably still in process of being forwarded to her. She was advised to call back is she does indeed receive 300mg  capsules and would like 100mg  capsules sent to pharmacy near where she is residing.      I discussed the assessment and treatment plan with the patient. The patient was provided an opportunity to ask questions and all were answered. The patient agreed with the plan and demonstrated an understanding of the instructions.   The patient was advised to call back or seek an in-person evaluation if the symptoms worsen or if the condition fails to improve as anticipated.  I provided 11 minutes of non-face-to-face time during this encounter.  The entirety of the information documented in the History of Present Illness, Review of Systems and Physical Exam were personally obtained by me. Portions of this information were initially documented by the CMA and reviewed by me for thoroughness and accuracy.    Lelon Huh, MD [facility] [phone] (phone) [phone] (fax)  [place]

## 2021-10-10 ENCOUNTER — Telehealth: Payer: Self-pay

## 2021-10-10 NOTE — Telephone Encounter (Signed)
Please advise 

## 2021-10-10 NOTE — Telephone Encounter (Signed)
Copied from [place] [id number]. Topic: General - Other >> Oct 10, 2021  1:06 PM Valere Dross wrote: Reason for CRM:  Pt called in about a referral that she and PCP had talked about, as far as her getting a shot in her spine and requested an update about it, please advise

## 2021-10-11 ENCOUNTER — Telehealth: Payer: Self-pay

## 2021-10-11 DIAGNOSIS — M5136 Other intervertebral disc degeneration, lumbar region: Secondary | ICD-10-CM

## 2021-10-11 DIAGNOSIS — M5416 Radiculopathy, lumbar region: Secondary | ICD-10-CM

## 2021-10-11 DIAGNOSIS — M4807 Spinal stenosis, lumbosacral region: Secondary | ICD-10-CM

## 2021-10-11 NOTE — Telephone Encounter (Addendum)
There are many [facility] locations. Need address and phone number of the location she is trying to get into.

## 2021-10-11 NOTE — Telephone Encounter (Signed)
Copied from [place] [id number]. Topic: General - Other >> Oct 11, 2021  9:17 AM Tessa Lerner A wrote: Reason for CRM: The patient has called for an update on previously requested orders from their PCP  The patient shares that their insurance company has already approved to patient's request for coverage of their shots in the hip to help with pain  The only thing left is the orders from the patient's PCP to Milta Deiters, PA with [facility]  Please contact further when possible

## 2021-10-11 NOTE — Telephone Encounter (Signed)
Pt stated she needs it sent to [facility] for issues with her hip / pt stated she needs an injection for her hip.

## 2021-10-11 NOTE — Telephone Encounter (Signed)
Im not sure what patient needs. Is she requesting a referral to a specialist?  She recently had a virtual visit with Dr. Caryn Section, but there was no mention of a referral being placed. I tried calling patient to get clarification about the request below. Left message to call back. OK for [facility] triage to speak with patient.   Dr. Caryn Section, do you know anything about this?

## 2021-10-12 ENCOUNTER — Telehealth: Payer: Self-pay

## 2021-10-12 NOTE — Telephone Encounter (Signed)
Mabe, Lynnea Maizes, RN 2 hours ago (11:18 AM)   Dr.  [facility] [place], [place], [place] [address] [phone]   [place] # [phone]

## 2021-10-12 NOTE — Telephone Encounter (Signed)
Tried calling patient. Left message to call back. OK for [facility] triage to speak with patient.  

## 2021-10-12 NOTE — Telephone Encounter (Signed)
Pt was transferred from [place] and pt wouldn't speak so call was disconnected. Pt called back, left VM to call office back and speak with a nurse about information needed from insurance about hip injection.   Dr. Caryn Section: There are many [facility] locations. Need address and phone number of the location she is trying to get into.

## 2021-10-12 NOTE — Telephone Encounter (Signed)
Dr.  [facility] [place], [place], [place] [address] [phone]  [place] # [id number]

## 2021-10-13 NOTE — Addendum Note (Signed)
Addended by: Birdie Sons on: 10/13/2021 08:55 AM   Modules accepted: Orders

## 2021-10-13 NOTE — Telephone Encounter (Signed)
Order placed

## 2021-10-21 ENCOUNTER — Other Ambulatory Visit: Payer: Self-pay | Admitting: Family Medicine

## 2021-10-21 NOTE — Telephone Encounter (Signed)
Requested medication (s) are due for refill today: yes  Requested medication (s) are on the active medication list: yes  Last refill:  09/02/21 (mail order)  Future visit scheduled: no  Notes to clinic:  lab is from 2019, please assess. Also has an rx written in May 2022 for one year supply at a different mail order address. Please assess.  Requested Prescriptions  Pending Prescriptions Disp Refills   alendronate (FOSAMAX) 70 MG tablet [Pharmacy Med Name: Alendronate Sodium 70 MG Oral Tablet] 12 tablet 3    Sig: TAKE 1 TABLET BY MOUTH  WEEKLY IN THE MORNING WITH  A FULL GLASS OF WATER. DO  NOT EAT DRINK OR LIE DOWN  FOR 1/2 HOUR.     Endocrinology:  Bisphosphonates Failed - 10/21/2021 10:31 PM      Failed - Vitamin D in normal range and within 360 days    Vit D, 25-Hydroxy  Date Value Ref Range Status  09/12/2018 28.3 (L) 30.0 - 100.0 ng/mL Final    Comment:    Vitamin D deficiency has been defined by the [place] practice guideline as a level of serum 25-OH vitamin D less than 20 ng/mL (1,2). The Endocrine Society went on to further define vitamin D insufficiency as a level between 21 and 29 ng/mL (2). 1. IOM (Institute of Medicine). 2010. Dietary reference    intakes for calcium and D. [place]: The    [employer]. 2. Holick MF, Binkley [place], Bischoff-Ferrari HA, et al.    Evaluation, treatment, and prevention of vitamin D    deficiency: an Endocrine Society clinical practice    guideline. JCEM. 2011 Jul; 96(7):1911-30.           Passed - Ca in normal range and within 360 days    Calcium  Date Value Ref Range Status  07/06/2021 9.4 8.9 - 10.3 mg/dL Final   Calcium, Total  Date Value Ref Range Status  08/25/2013 9.1 8.5 - 10.1 mg/dL Final          Passed - Valid encounter within last 12 months    Recent Outpatient Visits           2 weeks ago Spinal stenosis of lumbosacral region   [facility]  Birdie Sons, MD   3 weeks ago No-show for appointment   [place], MD   6 months ago Chronic midline back pain, unspecified back location   [facility] Birdie Sons, MD   7 months ago Left hip pain   [facility] Birdie Sons, MD   8 months ago No-show for appointment   [facility] Birdie Sons, MD

## 2021-10-28 ENCOUNTER — Other Ambulatory Visit: Payer: Self-pay

## 2021-10-28 NOTE — Telephone Encounter (Signed)
Please review office note 10/03/2021.  I think you where changing the dose of her Gabapentin.   Thanks,   -Mickel Baas

## 2021-10-28 NOTE — Telephone Encounter (Signed)
[facility] faxed refill request for the following medications:  gabapentin (NEURONTIN) 100 MG capsule   Please advise.

## 2021-11-01 MED ORDER — GABAPENTIN 100 MG PO CAPS: 100.0000 mg | ORAL_CAPSULE | Freq: Three times a day (TID) | ORAL | 1 refills | Status: AC | PRN

## 2021-11-08 ENCOUNTER — Other Ambulatory Visit: Payer: Self-pay | Admitting: Family Medicine

## 2021-11-08 DIAGNOSIS — I10 Essential (primary) hypertension: Secondary | ICD-10-CM

## 2021-11-08 NOTE — Telephone Encounter (Signed)
Requested medications are due for refill today.  yes  Requested medications are on the active medications list.  yes  Last refill. 09/01/2021  Future visit scheduled.   no  Notes to clinic.  Pt has not been seen for a CPE in over 1 year. Pt has been in the office for other concerns. Please advise.    Requested Prescriptions  Pending Prescriptions Disp Refills   metoprolol succinate (TOPROL-XL) 25 MG 24 hr tablet [Pharmacy Med Name: Metoprolol Succinate ER 25 MG Oral Tablet Extended Release 24 Hour] 45 tablet 3    Sig: TAKE ONE-HALF TABLET BY  MOUTH DAILY     Cardiovascular:  Beta Blockers Failed - 11/08/2021 10:42 PM      Failed - Valid encounter within last 6 months    Recent Outpatient Visits           1 month ago Spinal stenosis of lumbosacral region   [facility] Birdie Sons, MD   1 month ago No-show for appointment   [place], MD   7 months ago Chronic midline back pain, unspecified back location   [facility] Birdie Sons, MD   8 months ago Left hip pain   [facility] Birdie Sons, MD   8 months ago No-show for appointment   [place], MD              Passed - Last BP in normal range    BP Readings from Last 1 Encounters:  07/06/21 123/61          Passed - Last Heart Rate in normal range    Pulse Readings from Last 1 Encounters:  07/06/21 66

## 2021-11-24 ENCOUNTER — Other Ambulatory Visit: Payer: Self-pay | Admitting: Family Medicine

## 2021-11-24 NOTE — Telephone Encounter (Signed)
Requested medications are due for refill today.  Xarelto - no, Cardizem - yes  Requested medications are on the active medications list.  yes  Last refill. Xarelto 02/24/2021 #90 with 3 refills. Cardizem 09/01/2021 #90/0  Future visit scheduled.   no  Notes to clinic.  Pt has not been seen in the office is a long time. Pt should have enough xarelto to last until 02/2022. Refill for Xarelto would fail protocol d/t abnormal labs.  Pt had a virtual appointment in 10/03/2021, but did not address these issues. Please advise. Per notes - pt may no longer be living in the area.    Requested Prescriptions  Pending Prescriptions Disp Refills   XARELTO 20 MG TABS tablet [Pharmacy Med Name: Xarelto 20 MG Oral Tablet] 90 tablet 3    Sig: TAKE 1 TABLET BY MOUTH  DAILY WITH SUPPER     Hematology: Anticoagulants - rivaroxaban Failed - 11/24/2021 11:04 PM      Failed - HCT in normal range and within 360 days    HCT  Date Value Ref Range Status  07/06/2021 27.5 (L) 36.0 - 46.0 % Final   Hematocrit  Date Value Ref Range Status  08/02/2016 41.9 34.0 - 46.6 % Final          Failed - HGB in normal range and within 360 days    Hemoglobin  Date Value Ref Range Status  07/06/2021 9.0 (L) 12.0 - 15.0 g/dL Final  08/02/2016 14.2 11.1 - 15.9 g/dL Final          Passed - ALT in normal range and within 360 days    ALT  Date Value Ref Range Status  06/14/2021 14 0 - 44 U/L Final   SGPT (ALT)  Date Value Ref Range Status  08/24/2013 26 12 - 78 U/L Final          Passed - AST in normal range and within 360 days    AST  Date Value Ref Range Status  06/14/2021 18 15 - 41 U/L Final   SGOT(AST)  Date Value Ref Range Status  08/24/2013 23 15 - 37 Unit/L Final          Passed - Cr in normal range and within 360 days    Creatinine  Date Value Ref Range Status  08/25/2013 0.66 0.60 - 1.30 mg/dL Final   Creatinine, Ser  Date Value Ref Range Status  07/06/2021 0.53 0.44 - 1.00 mg/dL Final           Passed - PLT in normal range and within 360 days    Platelets  Date Value Ref Range Status  07/06/2021 153 150 - 400 K/uL Final  08/02/2016 189 150 - 379 x10E3/uL Final          Passed - eGFR is 15 or above and within 360 days    EGFR (African American)  Date Value Ref Range Status  08/25/2013 >60  Final   GFR calc Af Amer  Date Value Ref Range Status  12/03/2019 >60 >60 mL/min Final   EGFR (Non-African Amer.)  Date Value Ref Range Status  08/25/2013 >60  Final    Comment:    eGFR values <26m/min/1.73 m2 may be an indication of chronic kidney disease (CKD). Calculated eGFR is useful in patients with stable renal function. The eGFR calculation will not be reliable in acutely ill patients when serum creatinine is changing rapidly. It is not useful in  patients on dialysis. The eGFR calculation may not  be applicable to patients at the low and high extremes of body sizes, pregnant women, and vegetarians.    GFR, Estimated  Date Value Ref Range Status  07/06/2021 >60 >60 mL/min Final    Comment:    (NOTE) Calculated using the CKD-EPI Creatinine Equation (2021)           Passed - Patient is not pregnant      Passed - Valid encounter within last 12 months    Recent Outpatient Visits           1 month ago Spinal stenosis of lumbosacral region   [place], Kirstie Peri, MD   1 month ago No-show for appointment   [facility] Birdie Sons, MD   7 months ago Chronic midline back pain, unspecified back location   [facility] Birdie Sons, MD   8 months ago Left hip pain   [facility] Birdie Sons, MD   9 months ago No-show for appointment   [facility] Birdie Sons, MD               diltiazem (CARDIZEM CD) 240 MG 24 hr capsule [Pharmacy Med Name: DILTIAZEM  240MG  CAP  24 HR CD] 90 capsule 3    Sig: TAKE 1 CAPSULE BY MOUTH DAILY     Cardiovascular: Calcium  Channel Blockers 3 Failed - 11/24/2021 11:04 PM      Failed - Valid encounter within last 6 months    Recent Outpatient Visits           1 month ago Spinal stenosis of lumbosacral region   [facility] Birdie Sons, MD   1 month ago No-show for appointment   [facility] Birdie Sons, MD   7 months ago Chronic midline back pain, unspecified back location   [facility] Birdie Sons, MD   8 months ago Left hip pain   [facility] Birdie Sons, MD   9 months ago No-show for appointment   [place], MD              Passed - ALT in normal range and within 360 days    ALT  Date Value Ref Range Status  06/14/2021 14 0 - 44 U/L Final   SGPT (ALT)  Date Value Ref Range Status  08/24/2013 26 12 - 78 U/L Final          Passed - AST in normal range and within 360 days    AST  Date Value Ref Range Status  06/14/2021 18 15 - 41 U/L Final   SGOT(AST)  Date Value Ref Range Status  08/24/2013 23 15 - 37 Unit/L Final          Passed - Cr in normal range and within 360 days    Creatinine  Date Value Ref Range Status  08/25/2013 0.66 0.60 - 1.30 mg/dL Final   Creatinine, Ser  Date Value Ref Range Status  07/06/2021 0.53 0.44 - 1.00 mg/dL Final          Passed - Last BP in normal range    BP Readings from Last 1 Encounters:  07/06/21 123/61          Passed - Last Heart Rate in normal range    Pulse Readings from Last 1 Encounters:  07/06/21 66

## 2022-01-16 ENCOUNTER — Telehealth: Payer: Self-pay

## 2022-01-16 ENCOUNTER — Telehealth: Payer: Self-pay | Admitting: Family Medicine

## 2022-01-16 NOTE — Telephone Encounter (Signed)
Copied from [place] [phone]. Topic: Referral - Request for Referral ?>> Jan 16, 2022  5:04 PM Erick Blinks wrote: ?Has patient seen PCP for this complaint?  ?*If NO, is insurance requiring patient see PCP for this issue before PCP can refer them? ?Referral for which specialty: Assisted or Independent living suggestions  ?Preferred provider/office: Seeking highest recommended in [place], [place]  ?Reason for referral: Seeking assisted living recommendations ?

## 2022-01-16 NOTE — Telephone Encounter (Signed)
Copied from [place] [phone]. Topic: Medicare AWV ?>> Jan 16, 2022 11:12 AM Cher Nakai R wrote: ?Reason for CRM: ?Left message for patient to call back and schedule Medicare Annual Wellness Visit (AWV) in office.  ? ?If not able to come in office, please offer to do virtually or by telephone.  ? ?Last AWV: 08/09/2020 ? ?Please schedule at anytime with [facility] Health Advisor. ? ?If any questions, please contact me at [phone] ?

## 2022-01-16 NOTE — Telephone Encounter (Signed)
Pt returned call, tried to schedule but there are blocks for scheduling using Book it. Please advise, tried calling care guide and flow coordinator.  ?

## 2022-01-17 NOTE — Telephone Encounter (Signed)
Pt called back requesting if Dr Caryn Section answered her question about ALF. I relayed the provider message, pt understood. ?

## 2022-01-17 NOTE — Telephone Encounter (Signed)
I don't have any information at all on assisted living facilities in [place].  ?

## 2022-01-23 DIAGNOSIS — M48062 Spinal stenosis, lumbar region with neurogenic claudication: Secondary | ICD-10-CM | POA: Diagnosis not present

## 2022-01-23 DIAGNOSIS — R262 Difficulty in walking, not elsewhere classified: Secondary | ICD-10-CM | POA: Diagnosis not present

## 2022-01-23 DIAGNOSIS — M6281 Muscle weakness (generalized): Secondary | ICD-10-CM | POA: Diagnosis not present

## 2022-01-23 DIAGNOSIS — M545 Low back pain, unspecified: Secondary | ICD-10-CM | POA: Diagnosis not present

## 2022-01-24 ENCOUNTER — Telehealth: Payer: Self-pay | Admitting: Family Medicine

## 2022-01-24 NOTE — Telephone Encounter (Signed)
Copied from [place] [phone]. Topic: Medicare AWV ?>> Jan 24, 2022  9:46 AM Cher Nakai R wrote: ?Reason for CRM:  ?Left message for patient to call back and schedule Medicare Annual Wellness Visit (AWV) in office.  ? ?If unable to come into the office for AWV,  please offer to do virtually or by telephone. ? ?Last AWV: 08/09/2020 ? ?Please schedule at anytime with [place]. ? ?30 minute appointment for Virtual or phone ?45 minute appointment for in office or Initial virtual/phone ? ?Any questions, please contact me at [phone] ?

## 2022-01-26 DIAGNOSIS — R262 Difficulty in walking, not elsewhere classified: Secondary | ICD-10-CM | POA: Diagnosis not present

## 2022-01-26 DIAGNOSIS — M545 Low back pain, unspecified: Secondary | ICD-10-CM | POA: Diagnosis not present

## 2022-01-26 DIAGNOSIS — M6281 Muscle weakness (generalized): Secondary | ICD-10-CM | POA: Diagnosis not present

## 2022-01-26 DIAGNOSIS — M48062 Spinal stenosis, lumbar region with neurogenic claudication: Secondary | ICD-10-CM | POA: Diagnosis not present

## 2022-01-31 DIAGNOSIS — M545 Low back pain, unspecified: Secondary | ICD-10-CM | POA: Diagnosis not present

## 2022-01-31 DIAGNOSIS — M48062 Spinal stenosis, lumbar region with neurogenic claudication: Secondary | ICD-10-CM | POA: Diagnosis not present

## 2022-01-31 DIAGNOSIS — R262 Difficulty in walking, not elsewhere classified: Secondary | ICD-10-CM | POA: Diagnosis not present

## 2022-01-31 DIAGNOSIS — M6281 Muscle weakness (generalized): Secondary | ICD-10-CM | POA: Diagnosis not present

## 2022-02-02 DIAGNOSIS — M48062 Spinal stenosis, lumbar region with neurogenic claudication: Secondary | ICD-10-CM | POA: Diagnosis not present

## 2022-02-02 DIAGNOSIS — M545 Low back pain, unspecified: Secondary | ICD-10-CM | POA: Diagnosis not present

## 2022-02-02 DIAGNOSIS — M6281 Muscle weakness (generalized): Secondary | ICD-10-CM | POA: Diagnosis not present

## 2022-02-02 DIAGNOSIS — R262 Difficulty in walking, not elsewhere classified: Secondary | ICD-10-CM | POA: Diagnosis not present

## 2022-02-07 DIAGNOSIS — M48062 Spinal stenosis, lumbar region with neurogenic claudication: Secondary | ICD-10-CM | POA: Diagnosis not present

## 2022-02-07 DIAGNOSIS — R262 Difficulty in walking, not elsewhere classified: Secondary | ICD-10-CM | POA: Diagnosis not present

## 2022-02-07 DIAGNOSIS — M545 Low back pain, unspecified: Secondary | ICD-10-CM | POA: Diagnosis not present

## 2022-02-07 DIAGNOSIS — M6281 Muscle weakness (generalized): Secondary | ICD-10-CM | POA: Diagnosis not present

## 2022-02-09 ENCOUNTER — Telehealth: Payer: Self-pay

## 2022-02-09 NOTE — Telephone Encounter (Signed)
Copied from [place] [phone]. Topic: General - Other ?>> Feb 09, 2022  4:15 PM McGill, Nelva Bush wrote: ?Reason for CRM: Requesting permission from Dr.Fisher to get injection left quadrant on back. Pt stated needs permission from PCP for Dr. at [place] [facility] to give pt injection. It was very hard to understand exactly what pt needed. Pt stated Dr.Fisher had approved this before.  ? ?Please advise. ?

## 2022-02-10 NOTE — Telephone Encounter (Signed)
Request form was already completed and sent to medical records to be faxed.  ?

## 2022-02-15 ENCOUNTER — Telehealth: Payer: Self-pay | Admitting: Family Medicine

## 2022-02-15 NOTE — Telephone Encounter (Signed)
Copied from [place] [phone]. Topic: Medicare AWV ?>> Feb 15, 2022  1:13 PM Cher Nakai R wrote: ?Reason for CRM:  ?Left message for patient to call back and schedule Medicare Annual Wellness Visit (AWV) in office.  ? ?If unable to come into the office for AWV,  please offer to do virtually or by telephone. ? ?Last AWV: 08/09/2020 ? ?Please schedule at anytime with [facility] Health Advisor. ? ?30 minute appointment for Virtual or phone ?45 minute appointment for in office or Initial virtual/phone ? ?Any questions, please contact me at [phone] ?

## 2022-02-21 DIAGNOSIS — M461 Sacroiliitis, not elsewhere classified: Secondary | ICD-10-CM | POA: Diagnosis not present

## 2022-03-28 ENCOUNTER — Telehealth: Payer: Self-pay | Admitting: Cardiovascular Disease

## 2022-03-28 DIAGNOSIS — M419 Scoliosis, unspecified: Secondary | ICD-10-CM | POA: Diagnosis not present

## 2022-03-28 DIAGNOSIS — M6281 Muscle weakness (generalized): Secondary | ICD-10-CM | POA: Diagnosis not present

## 2022-03-28 DIAGNOSIS — M7062 Trochanteric bursitis, left hip: Secondary | ICD-10-CM | POA: Diagnosis not present

## 2022-03-28 DIAGNOSIS — M5136 Other intervertebral disc degeneration, lumbar region: Secondary | ICD-10-CM | POA: Diagnosis not present

## 2022-03-28 NOTE — Telephone Encounter (Signed)
3 attempts to schedule fu appt from recall list.   Deleting recall.   

## 2022-03-30 DIAGNOSIS — R262 Difficulty in walking, not elsewhere classified: Secondary | ICD-10-CM | POA: Diagnosis not present

## 2022-03-30 DIAGNOSIS — M6281 Muscle weakness (generalized): Secondary | ICD-10-CM | POA: Diagnosis not present

## 2022-03-30 DIAGNOSIS — M545 Low back pain, unspecified: Secondary | ICD-10-CM | POA: Diagnosis not present

## 2022-04-10 ENCOUNTER — Telehealth: Payer: Self-pay | Admitting: Family Medicine

## 2022-04-10 NOTE — Telephone Encounter (Signed)
Copied from [place] [id number]. Topic: Medicare AWV >> Apr 10, 2022  3:05 PM Jae Dire wrote: Reason for CRM:  Left message for patient to call back and schedule Medicare Annual Wellness Visit (AWV) in office.   If unable to come into the office for AWV,  please offer to do virtually or by telephone.  Last AWV: 08/09/2020  Please schedule at anytime with [facility] Health Advisor.  30 minute appointment for Virtual or phone 45 minute appointment for in office or Initial virtual/phone  Any questions, please contact me at [phone]

## 2022-04-11 NOTE — Telephone Encounter (Signed)
Pt sister [name] stated pt lives 3 hours away now and they are looking for a new PCP.  However, they haven't found anyone yet, and they aren't interested in scheduling at this time.

## 2022-04-20 DIAGNOSIS — M545 Low back pain, unspecified: Secondary | ICD-10-CM | POA: Diagnosis not present

## 2022-04-20 DIAGNOSIS — M6281 Muscle weakness (generalized): Secondary | ICD-10-CM | POA: Diagnosis not present

## 2022-04-20 DIAGNOSIS — R262 Difficulty in walking, not elsewhere classified: Secondary | ICD-10-CM | POA: Diagnosis not present

## 2022-04-29 DIAGNOSIS — R29818 Other symptoms and signs involving the nervous system: Secondary | ICD-10-CM | POA: Diagnosis not present

## 2022-04-29 DIAGNOSIS — G629 Polyneuropathy, unspecified: Secondary | ICD-10-CM | POA: Diagnosis not present

## 2022-04-29 DIAGNOSIS — I10 Essential (primary) hypertension: Secondary | ICD-10-CM | POA: Diagnosis not present

## 2022-04-29 DIAGNOSIS — R42 Dizziness and giddiness: Secondary | ICD-10-CM | POA: Diagnosis not present

## 2022-04-29 DIAGNOSIS — Z7901 Long term (current) use of anticoagulants: Secondary | ICD-10-CM | POA: Diagnosis not present

## 2022-04-29 DIAGNOSIS — Z86718 Personal history of other venous thrombosis and embolism: Secondary | ICD-10-CM | POA: Diagnosis not present

## 2022-04-29 DIAGNOSIS — R9402 Abnormal brain scan: Secondary | ICD-10-CM | POA: Diagnosis not present

## 2022-04-29 DIAGNOSIS — I480 Paroxysmal atrial fibrillation: Secondary | ICD-10-CM | POA: Diagnosis not present

## 2022-04-29 DIAGNOSIS — Z8673 Personal history of transient ischemic attack (TIA), and cerebral infarction without residual deficits: Secondary | ICD-10-CM | POA: Diagnosis not present

## 2022-04-30 DIAGNOSIS — Z7901 Long term (current) use of anticoagulants: Secondary | ICD-10-CM | POA: Diagnosis not present

## 2022-04-30 DIAGNOSIS — I639 Cerebral infarction, unspecified: Secondary | ICD-10-CM | POA: Diagnosis not present

## 2022-04-30 DIAGNOSIS — R42 Dizziness and giddiness: Secondary | ICD-10-CM | POA: Diagnosis not present

## 2022-04-30 DIAGNOSIS — I6930 Unspecified sequelae of cerebral infarction: Secondary | ICD-10-CM | POA: Diagnosis not present

## 2022-04-30 DIAGNOSIS — Z8679 Personal history of other diseases of the circulatory system: Secondary | ICD-10-CM | POA: Diagnosis not present

## 2022-04-30 DIAGNOSIS — R9089 Other abnormal findings on diagnostic imaging of central nervous system: Secondary | ICD-10-CM | POA: Diagnosis not present

## 2022-05-01 DIAGNOSIS — Z7901 Long term (current) use of anticoagulants: Secondary | ICD-10-CM | POA: Diagnosis not present

## 2022-05-01 DIAGNOSIS — Z8679 Personal history of other diseases of the circulatory system: Secondary | ICD-10-CM | POA: Diagnosis not present

## 2022-05-01 DIAGNOSIS — I639 Cerebral infarction, unspecified: Secondary | ICD-10-CM | POA: Diagnosis not present

## 2022-05-01 DIAGNOSIS — R9089 Other abnormal findings on diagnostic imaging of central nervous system: Secondary | ICD-10-CM | POA: Diagnosis not present

## 2022-05-03 ENCOUNTER — Other Ambulatory Visit: Payer: Self-pay | Admitting: Family Medicine

## 2022-05-03 DIAGNOSIS — I10 Essential (primary) hypertension: Secondary | ICD-10-CM

## 2022-05-03 NOTE — Telephone Encounter (Signed)
Pt has moved 200+ miles but says she still in [place]. Scheduled VV for 05/08/22 at 1120. Requested Prescriptions  Pending Prescriptions Disp Refills  . metoprolol succinate (TOPROL-XL) 25 MG 24 hr tablet [Pharmacy Med Name: Metoprolol Succinate ER 25 MG Oral Tablet Extended Release 24 Hour] 50 tablet 2    Sig: TAKE ONE-HALF TABLET BY MOUTH  DAILY     Cardiovascular:  Beta Blockers Failed - 05/03/2022  7:34 AM      Failed - Valid encounter within last 6 months    Recent Outpatient Visits          7 months ago Spinal stenosis of lumbosacral region   [facility] Birdie Sons, MD   7 months ago No-show for appointment   [facility] Birdie Sons, MD   1 year ago Chronic midline back pain, unspecified back location   [facility] Birdie Sons, MD   1 year ago Left hip pain   [facility] Birdie Sons, MD   1 year ago No-show for appointment   [place], MD      Future Appointments            In 5 days Fisher, Kirstie Peri, MD [facility] BP in normal range    BP Readings from Last 1 Encounters:  07/06/21 123/61         Passed - Last Heart Rate in normal range    Pulse Readings from Last 1 Encounters:  07/06/21 66

## 2022-05-08 ENCOUNTER — Telehealth: Payer: Medicare Other | Admitting: Family Medicine

## 2022-05-09 ENCOUNTER — Telehealth: Payer: Self-pay | Admitting: Family Medicine

## 2022-05-09 ENCOUNTER — Telehealth: Payer: Medicare Other | Admitting: Family Medicine

## 2022-05-09 NOTE — Telephone Encounter (Signed)
Spoke with patient and she told me that she is no longer a patient with [facility]

## 2022-05-10 DIAGNOSIS — M6281 Muscle weakness (generalized): Secondary | ICD-10-CM | POA: Diagnosis not present

## 2022-05-10 DIAGNOSIS — M5136 Other intervertebral disc degeneration, lumbar region: Secondary | ICD-10-CM | POA: Diagnosis not present

## 2022-05-10 DIAGNOSIS — M419 Scoliosis, unspecified: Secondary | ICD-10-CM | POA: Diagnosis not present

## 2022-05-10 DIAGNOSIS — M7062 Trochanteric bursitis, left hip: Secondary | ICD-10-CM | POA: Diagnosis not present

## 2022-05-11 DIAGNOSIS — M7989 Other specified soft tissue disorders: Secondary | ICD-10-CM | POA: Diagnosis not present

## 2022-05-11 DIAGNOSIS — R600 Localized edema: Secondary | ICD-10-CM | POA: Diagnosis not present

## 2022-05-11 DIAGNOSIS — M79662 Pain in left lower leg: Secondary | ICD-10-CM | POA: Diagnosis not present

## 2022-05-11 DIAGNOSIS — R109 Unspecified abdominal pain: Secondary | ICD-10-CM | POA: Diagnosis not present

## 2022-05-11 DIAGNOSIS — R609 Edema, unspecified: Secondary | ICD-10-CM | POA: Diagnosis not present

## 2022-05-11 DIAGNOSIS — K5730 Diverticulosis of large intestine without perforation or abscess without bleeding: Secondary | ICD-10-CM | POA: Diagnosis not present

## 2022-06-11 ENCOUNTER — Other Ambulatory Visit: Payer: Self-pay | Admitting: Family Medicine

## 2022-06-12 NOTE — Telephone Encounter (Signed)
Patient has moved away. Please advise patient that she needs to have her new PCP refill this medications.

## 2022-07-10 ENCOUNTER — Other Ambulatory Visit: Payer: Self-pay | Admitting: Family Medicine

## 2022-07-10 DIAGNOSIS — I10 Essential (primary) hypertension: Secondary | ICD-10-CM

## 2022-07-11 NOTE — Telephone Encounter (Signed)
Called patient to schedule appt for medication refills. No answer, LVMTCB #[phone]. 

## 2022-07-11 NOTE — Telephone Encounter (Signed)
Requested medication (s) are due for refill today: yes  Requested medication (s) are on the active medication list: yes  Last refill:  05/03/22 #45 0 refills  Future visit scheduled: no   Notes to clinic:  no vaild encounter with 1 year. Called patient to schedule appt for med. Refills. No answer, LVMTCB . Do you want to give courtesy refill?     Requested Prescriptions  Pending Prescriptions Disp Refills   metoprolol succinate (TOPROL-XL) 25 MG 24 hr tablet [Pharmacy Med Name: Metoprolol Succinate ER 25 MG Oral Tablet Extended Release 24 Hour] 45 tablet 3    Sig: TAKE ONE-HALF TABLET BY MOUTH  DAILY     Cardiovascular:  Beta Blockers Failed - 07/10/2022 10:04 PM      Failed - Valid encounter within last 6 months    Recent Outpatient Visits           9 months ago Spinal stenosis of lumbosacral region   [facility] Birdie Sons, MD   9 months ago No-show for appointment   [facility] Birdie Sons, MD   1 year ago Chronic midline back pain, unspecified back location   [facility] Birdie Sons, MD   1 year ago Left hip pain   [place] Birdie Sons, MD   1 year ago No-show for appointment   [place], MD              Passed - Last BP in normal range    BP Readings from Last 1 Encounters:  07/06/21 123/61         Passed - Last Heart Rate in normal range    Pulse Readings from Last 1 Encounters:  07/06/21 66

## 2022-07-25 DIAGNOSIS — R0602 Shortness of breath: Secondary | ICD-10-CM | POA: Diagnosis not present

## 2022-07-25 DIAGNOSIS — Z20822 Contact with and (suspected) exposure to covid-19: Secondary | ICD-10-CM | POA: Diagnosis not present

## 2022-07-25 DIAGNOSIS — Z7901 Long term (current) use of anticoagulants: Secondary | ICD-10-CM | POA: Diagnosis not present

## 2022-07-25 DIAGNOSIS — R059 Cough, unspecified: Secondary | ICD-10-CM | POA: Diagnosis not present

## 2022-07-25 DIAGNOSIS — I4891 Unspecified atrial fibrillation: Secondary | ICD-10-CM | POA: Diagnosis not present

## 2022-07-30 ENCOUNTER — Other Ambulatory Visit: Payer: Self-pay | Admitting: Family Medicine

## 2022-08-07 DIAGNOSIS — R296 Repeated falls: Secondary | ICD-10-CM | POA: Diagnosis not present

## 2022-08-07 DIAGNOSIS — Z8673 Personal history of transient ischemic attack (TIA), and cerebral infarction without residual deficits: Secondary | ICD-10-CM | POA: Diagnosis not present

## 2022-08-07 DIAGNOSIS — E785 Hyperlipidemia, unspecified: Secondary | ICD-10-CM | POA: Diagnosis not present

## 2022-08-07 DIAGNOSIS — M810 Age-related osteoporosis without current pathological fracture: Secondary | ICD-10-CM | POA: Diagnosis not present

## 2022-08-07 DIAGNOSIS — N3289 Other specified disorders of bladder: Secondary | ICD-10-CM | POA: Diagnosis not present

## 2022-08-07 DIAGNOSIS — H539 Unspecified visual disturbance: Secondary | ICD-10-CM | POA: Diagnosis not present

## 2022-08-07 DIAGNOSIS — Z0000 Encounter for general adult medical examination without abnormal findings: Secondary | ICD-10-CM | POA: Diagnosis not present

## 2022-08-07 DIAGNOSIS — R2242 Localized swelling, mass and lump, left lower limb: Secondary | ICD-10-CM | POA: Diagnosis not present

## 2022-08-07 DIAGNOSIS — I4891 Unspecified atrial fibrillation: Secondary | ICD-10-CM | POA: Diagnosis not present

## 2022-08-07 DIAGNOSIS — C4492 Squamous cell carcinoma of skin, unspecified: Secondary | ICD-10-CM | POA: Diagnosis not present

## 2022-08-17 ENCOUNTER — Other Ambulatory Visit: Payer: Self-pay | Admitting: Family Medicine

## 2022-08-17 NOTE — Telephone Encounter (Signed)
Requested medications are due for refill today.  yes  Requested medications are on the active medications list.  yes  Last refill. 11/29/2021 #90 2 rf  Future visit scheduled.   no  Notes to clinic.  Labs are expired.    Requested Prescriptions  Pending Prescriptions Disp Refills   XARELTO 20 MG TABS tablet [Pharmacy Med Name: Xarelto 20 MG Oral Tablet] 70 tablet 4    Sig: TAKE 1 TABLET BY MOUTH  DAILY WITH SUPPER     Hematology: Anticoagulants - rivaroxaban Failed - 08/17/2022 12:16 AM      Failed - ALT in normal range and within 360 days    ALT  Date Value Ref Range Status  06/14/2021 14 0 - 44 U/L Final   SGPT (ALT)  Date Value Ref Range Status  08/24/2013 26 12 - 78 U/L Final         Failed - AST in normal range and within 360 days    AST  Date Value Ref Range Status  06/14/2021 18 15 - 41 U/L Final   SGOT(AST)  Date Value Ref Range Status  08/24/2013 23 15 - 37 Unit/L Final         Failed - Cr in normal range and within 360 days    Creatinine  Date Value Ref Range Status  08/25/2013 0.66 0.60 - 1.30 mg/dL Final   Creatinine, Ser  Date Value Ref Range Status  07/06/2021 0.53 0.44 - 1.00 mg/dL Final         Failed - HCT in normal range and within 360 days    HCT  Date Value Ref Range Status  07/06/2021 27.5 (L) 36.0 - 46.0 % Final   Hematocrit  Date Value Ref Range Status  08/02/2016 41.9 34.0 - 46.6 % Final         Failed - HGB in normal range and within 360 days    Hemoglobin  Date Value Ref Range Status  07/06/2021 9.0 (L) 12.0 - 15.0 g/dL Final  08/02/2016 14.2 11.1 - 15.9 g/dL Final         Failed - PLT in normal range and within 360 days    Platelets  Date Value Ref Range Status  07/06/2021 153 150 - 400 K/uL Final  08/02/2016 189 150 - 379 x10E3/uL Final         Failed - eGFR is 15 or above and within 360 days    EGFR (African American)  Date Value Ref Range Status  08/25/2013 >60  Final   GFR calc Af Amer  Date Value Ref Range  Status  12/03/2019 >60 >60 mL/min Final   EGFR (Non-African Amer.)  Date Value Ref Range Status  08/25/2013 >60  Final    Comment:    eGFR values <68m/min/1.73 m2 may be an indication of chronic kidney disease (CKD). Calculated eGFR is useful in patients with stable renal function. The eGFR calculation will not be reliable in acutely ill patients when serum creatinine is changing rapidly. It is not useful in  patients on dialysis. The eGFR calculation may not be applicable to patients at the low and high extremes of body sizes, pregnant women, and vegetarians.    GFR, Estimated  Date Value Ref Range Status  07/06/2021 >60 >60 mL/min Final    Comment:    (NOTE) Calculated using the CKD-EPI Creatinine Equation (2021)          Failed - Valid encounter within last 12 months    Recent Outpatient  Visits           10 months ago Spinal stenosis of lumbosacral region   [place], Kirstie Peri, MD   10 months ago No-show for appointment   [facility] Birdie Sons, MD   1 year ago Chronic midline back pain, unspecified back location   [place], Kirstie Peri, MD   1 year ago Left hip pain   [facility] Birdie Sons, MD   1 year ago No-show for appointment   [place], MD              Passed - Patient is not pregnant

## 2022-08-23 DIAGNOSIS — R2689 Other abnormalities of gait and mobility: Secondary | ICD-10-CM | POA: Diagnosis not present

## 2022-08-23 DIAGNOSIS — R296 Repeated falls: Secondary | ICD-10-CM | POA: Diagnosis not present

## 2022-08-23 DIAGNOSIS — I639 Cerebral infarction, unspecified: Secondary | ICD-10-CM | POA: Diagnosis not present

## 2022-08-23 DIAGNOSIS — R2681 Unsteadiness on feet: Secondary | ICD-10-CM | POA: Diagnosis not present

## 2022-08-24 DIAGNOSIS — I639 Cerebral infarction, unspecified: Secondary | ICD-10-CM | POA: Diagnosis not present

## 2022-08-24 DIAGNOSIS — R296 Repeated falls: Secondary | ICD-10-CM | POA: Diagnosis not present

## 2022-08-24 DIAGNOSIS — R2689 Other abnormalities of gait and mobility: Secondary | ICD-10-CM | POA: Diagnosis not present

## 2022-08-24 DIAGNOSIS — R2681 Unsteadiness on feet: Secondary | ICD-10-CM | POA: Diagnosis not present

## 2022-08-26 ENCOUNTER — Other Ambulatory Visit: Payer: Self-pay | Admitting: Family Medicine

## 2022-08-26 DIAGNOSIS — I10 Essential (primary) hypertension: Secondary | ICD-10-CM

## 2022-08-29 DIAGNOSIS — R2689 Other abnormalities of gait and mobility: Secondary | ICD-10-CM | POA: Diagnosis not present

## 2022-08-29 DIAGNOSIS — I639 Cerebral infarction, unspecified: Secondary | ICD-10-CM | POA: Diagnosis not present

## 2022-08-29 DIAGNOSIS — R296 Repeated falls: Secondary | ICD-10-CM | POA: Diagnosis not present

## 2022-08-29 DIAGNOSIS — R2681 Unsteadiness on feet: Secondary | ICD-10-CM | POA: Diagnosis not present

## 2022-09-01 DIAGNOSIS — R2681 Unsteadiness on feet: Secondary | ICD-10-CM | POA: Diagnosis not present

## 2022-09-01 DIAGNOSIS — R2689 Other abnormalities of gait and mobility: Secondary | ICD-10-CM | POA: Diagnosis not present

## 2022-09-01 DIAGNOSIS — I639 Cerebral infarction, unspecified: Secondary | ICD-10-CM | POA: Diagnosis not present

## 2022-09-01 DIAGNOSIS — R296 Repeated falls: Secondary | ICD-10-CM | POA: Diagnosis not present

## 2022-09-04 DIAGNOSIS — H539 Unspecified visual disturbance: Secondary | ICD-10-CM | POA: Diagnosis not present

## 2022-09-04 DIAGNOSIS — I4891 Unspecified atrial fibrillation: Secondary | ICD-10-CM | POA: Diagnosis not present

## 2022-09-04 DIAGNOSIS — R296 Repeated falls: Secondary | ICD-10-CM | POA: Diagnosis not present

## 2022-09-04 DIAGNOSIS — C4492 Squamous cell carcinoma of skin, unspecified: Secondary | ICD-10-CM | POA: Diagnosis not present

## 2022-09-04 DIAGNOSIS — M25519 Pain in unspecified shoulder: Secondary | ICD-10-CM | POA: Diagnosis not present

## 2022-09-04 DIAGNOSIS — N3289 Other specified disorders of bladder: Secondary | ICD-10-CM | POA: Diagnosis not present

## 2022-09-04 DIAGNOSIS — R600 Localized edema: Secondary | ICD-10-CM | POA: Diagnosis not present

## 2022-09-04 DIAGNOSIS — Z8673 Personal history of transient ischemic attack (TIA), and cerebral infarction without residual deficits: Secondary | ICD-10-CM | POA: Diagnosis not present

## 2022-09-04 DIAGNOSIS — R0609 Other forms of dyspnea: Secondary | ICD-10-CM | POA: Diagnosis not present

## 2022-09-04 DIAGNOSIS — I739 Peripheral vascular disease, unspecified: Secondary | ICD-10-CM | POA: Diagnosis not present

## 2022-09-04 DIAGNOSIS — R2242 Localized swelling, mass and lump, left lower limb: Secondary | ICD-10-CM | POA: Diagnosis not present

## 2022-09-05 DIAGNOSIS — R600 Localized edema: Secondary | ICD-10-CM | POA: Diagnosis not present

## 2022-09-05 DIAGNOSIS — R2681 Unsteadiness on feet: Secondary | ICD-10-CM | POA: Diagnosis not present

## 2022-09-05 DIAGNOSIS — R2689 Other abnormalities of gait and mobility: Secondary | ICD-10-CM | POA: Diagnosis not present

## 2022-09-05 DIAGNOSIS — R296 Repeated falls: Secondary | ICD-10-CM | POA: Diagnosis not present

## 2022-09-05 DIAGNOSIS — I639 Cerebral infarction, unspecified: Secondary | ICD-10-CM | POA: Diagnosis not present

## 2022-09-07 DIAGNOSIS — R2689 Other abnormalities of gait and mobility: Secondary | ICD-10-CM | POA: Diagnosis not present

## 2022-09-07 DIAGNOSIS — R2681 Unsteadiness on feet: Secondary | ICD-10-CM | POA: Diagnosis not present

## 2022-09-07 DIAGNOSIS — R296 Repeated falls: Secondary | ICD-10-CM | POA: Diagnosis not present

## 2022-09-07 DIAGNOSIS — I639 Cerebral infarction, unspecified: Secondary | ICD-10-CM | POA: Diagnosis not present

## 2022-09-08 DIAGNOSIS — C44612 Basal cell carcinoma of skin of right upper limb, including shoulder: Secondary | ICD-10-CM | POA: Diagnosis not present

## 2022-09-08 DIAGNOSIS — L814 Other melanin hyperpigmentation: Secondary | ICD-10-CM | POA: Diagnosis not present

## 2022-09-08 DIAGNOSIS — L570 Actinic keratosis: Secondary | ICD-10-CM | POA: Diagnosis not present

## 2022-09-08 DIAGNOSIS — C44712 Basal cell carcinoma of skin of right lower limb, including hip: Secondary | ICD-10-CM | POA: Diagnosis not present

## 2022-09-08 DIAGNOSIS — D485 Neoplasm of uncertain behavior of skin: Secondary | ICD-10-CM | POA: Diagnosis not present

## 2022-09-08 DIAGNOSIS — L821 Other seborrheic keratosis: Secondary | ICD-10-CM | POA: Diagnosis not present

## 2022-09-08 DIAGNOSIS — L089 Local infection of the skin and subcutaneous tissue, unspecified: Secondary | ICD-10-CM | POA: Diagnosis not present

## 2022-09-10 DIAGNOSIS — G9009 Other idiopathic peripheral autonomic neuropathy: Secondary | ICD-10-CM | POA: Diagnosis not present

## 2022-09-10 DIAGNOSIS — Z7901 Long term (current) use of anticoagulants: Secondary | ICD-10-CM | POA: Diagnosis not present

## 2022-09-10 DIAGNOSIS — I499 Cardiac arrhythmia, unspecified: Secondary | ICD-10-CM | POA: Diagnosis not present

## 2022-09-10 DIAGNOSIS — S2243XD Multiple fractures of ribs, bilateral, subsequent encounter for fracture with routine healing: Secondary | ICD-10-CM | POA: Diagnosis not present

## 2022-09-10 DIAGNOSIS — T699XXA Effect of reduced temperature, unspecified, initial encounter: Secondary | ICD-10-CM | POA: Diagnosis not present

## 2022-09-10 DIAGNOSIS — S0990XA Unspecified injury of head, initial encounter: Secondary | ICD-10-CM | POA: Diagnosis not present

## 2022-09-10 DIAGNOSIS — I4821 Permanent atrial fibrillation: Secondary | ICD-10-CM | POA: Diagnosis not present

## 2022-09-10 DIAGNOSIS — Z8673 Personal history of transient ischemic attack (TIA), and cerebral infarction without residual deficits: Secondary | ICD-10-CM | POA: Diagnosis not present

## 2022-09-10 DIAGNOSIS — I4891 Unspecified atrial fibrillation: Secondary | ICD-10-CM | POA: Diagnosis not present

## 2022-09-10 DIAGNOSIS — M6281 Muscle weakness (generalized): Secondary | ICD-10-CM | POA: Diagnosis not present

## 2022-09-10 DIAGNOSIS — Z7401 Bed confinement status: Secondary | ICD-10-CM | POA: Diagnosis not present

## 2022-09-10 DIAGNOSIS — R6889 Other general symptoms and signs: Secondary | ICD-10-CM | POA: Diagnosis not present

## 2022-09-10 DIAGNOSIS — M542 Cervicalgia: Secondary | ICD-10-CM | POA: Diagnosis not present

## 2022-09-10 DIAGNOSIS — I480 Paroxysmal atrial fibrillation: Secondary | ICD-10-CM | POA: Diagnosis not present

## 2022-09-10 DIAGNOSIS — R69 Illness, unspecified: Secondary | ICD-10-CM | POA: Diagnosis not present

## 2022-09-10 DIAGNOSIS — S299XXA Unspecified injury of thorax, initial encounter: Secondary | ICD-10-CM | POA: Diagnosis not present

## 2022-09-10 DIAGNOSIS — M8000XD Age-related osteoporosis with current pathological fracture, unspecified site, subsequent encounter for fracture with routine healing: Secondary | ICD-10-CM | POA: Diagnosis not present

## 2022-09-10 DIAGNOSIS — W19XXXA Unspecified fall, initial encounter: Secondary | ICD-10-CM | POA: Diagnosis not present

## 2022-09-10 DIAGNOSIS — R0781 Pleurodynia: Secondary | ICD-10-CM | POA: Diagnosis not present

## 2022-09-10 DIAGNOSIS — R519 Headache, unspecified: Secondary | ICD-10-CM | POA: Diagnosis not present

## 2022-09-10 DIAGNOSIS — Z743 Need for continuous supervision: Secondary | ICD-10-CM | POA: Diagnosis not present

## 2022-09-10 DIAGNOSIS — N3281 Overactive bladder: Secondary | ICD-10-CM | POA: Diagnosis not present

## 2022-09-10 DIAGNOSIS — G629 Polyneuropathy, unspecified: Secondary | ICD-10-CM | POA: Diagnosis not present

## 2022-09-10 DIAGNOSIS — I10 Essential (primary) hypertension: Secondary | ICD-10-CM | POA: Diagnosis not present

## 2022-09-10 DIAGNOSIS — R079 Chest pain, unspecified: Secondary | ICD-10-CM | POA: Diagnosis not present

## 2022-09-10 DIAGNOSIS — R52 Pain, unspecified: Secondary | ICD-10-CM | POA: Diagnosis not present

## 2022-09-10 DIAGNOSIS — G8911 Acute pain due to trauma: Secondary | ICD-10-CM | POA: Diagnosis not present

## 2022-09-10 DIAGNOSIS — B372 Candidiasis of skin and nail: Secondary | ICD-10-CM | POA: Diagnosis not present

## 2022-09-11 DIAGNOSIS — W19XXXA Unspecified fall, initial encounter: Secondary | ICD-10-CM | POA: Diagnosis not present

## 2022-09-11 DIAGNOSIS — Z8673 Personal history of transient ischemic attack (TIA), and cerebral infarction without residual deficits: Secondary | ICD-10-CM | POA: Diagnosis not present

## 2022-09-11 DIAGNOSIS — R079 Chest pain, unspecified: Secondary | ICD-10-CM | POA: Diagnosis not present

## 2022-09-11 DIAGNOSIS — Z7901 Long term (current) use of anticoagulants: Secondary | ICD-10-CM | POA: Diagnosis not present

## 2022-09-11 DIAGNOSIS — I4821 Permanent atrial fibrillation: Secondary | ICD-10-CM | POA: Diagnosis not present

## 2022-09-11 DIAGNOSIS — I4891 Unspecified atrial fibrillation: Secondary | ICD-10-CM | POA: Diagnosis not present

## 2022-09-12 DIAGNOSIS — I4891 Unspecified atrial fibrillation: Secondary | ICD-10-CM | POA: Diagnosis not present

## 2022-09-12 DIAGNOSIS — Z8673 Personal history of transient ischemic attack (TIA), and cerebral infarction without residual deficits: Secondary | ICD-10-CM | POA: Diagnosis not present

## 2022-09-12 DIAGNOSIS — R079 Chest pain, unspecified: Secondary | ICD-10-CM | POA: Diagnosis not present

## 2022-09-12 DIAGNOSIS — W19XXXA Unspecified fall, initial encounter: Secondary | ICD-10-CM | POA: Diagnosis not present

## 2022-09-13 DIAGNOSIS — I480 Paroxysmal atrial fibrillation: Secondary | ICD-10-CM | POA: Diagnosis not present

## 2022-09-13 DIAGNOSIS — S2242XA Multiple fractures of ribs, left side, initial encounter for closed fracture: Secondary | ICD-10-CM | POA: Diagnosis not present

## 2022-09-13 DIAGNOSIS — N3281 Overactive bladder: Secondary | ICD-10-CM | POA: Diagnosis not present

## 2022-09-13 DIAGNOSIS — C4491 Basal cell carcinoma of skin, unspecified: Secondary | ICD-10-CM | POA: Diagnosis not present

## 2022-09-13 DIAGNOSIS — W19XXXA Unspecified fall, initial encounter: Secondary | ICD-10-CM | POA: Diagnosis not present

## 2022-09-13 DIAGNOSIS — R079 Chest pain, unspecified: Secondary | ICD-10-CM | POA: Diagnosis not present

## 2022-09-13 DIAGNOSIS — S2243XD Multiple fractures of ribs, bilateral, subsequent encounter for fracture with routine healing: Secondary | ICD-10-CM | POA: Diagnosis not present

## 2022-09-13 DIAGNOSIS — Z8673 Personal history of transient ischemic attack (TIA), and cerebral infarction without residual deficits: Secondary | ICD-10-CM | POA: Diagnosis not present

## 2022-09-13 DIAGNOSIS — I10 Essential (primary) hypertension: Secondary | ICD-10-CM | POA: Diagnosis not present

## 2022-09-13 DIAGNOSIS — G9009 Other idiopathic peripheral autonomic neuropathy: Secondary | ICD-10-CM | POA: Diagnosis not present

## 2022-09-13 DIAGNOSIS — I4891 Unspecified atrial fibrillation: Secondary | ICD-10-CM | POA: Diagnosis not present

## 2022-09-13 DIAGNOSIS — M8000XD Age-related osteoporosis with current pathological fracture, unspecified site, subsequent encounter for fracture with routine healing: Secondary | ICD-10-CM | POA: Diagnosis not present

## 2022-09-13 DIAGNOSIS — G8911 Acute pain due to trauma: Secondary | ICD-10-CM | POA: Diagnosis not present

## 2022-09-13 DIAGNOSIS — M810 Age-related osteoporosis without current pathological fracture: Secondary | ICD-10-CM | POA: Diagnosis not present

## 2022-09-13 DIAGNOSIS — Z7401 Bed confinement status: Secondary | ICD-10-CM | POA: Diagnosis not present

## 2022-09-13 DIAGNOSIS — B372 Candidiasis of skin and nail: Secondary | ICD-10-CM | POA: Diagnosis not present

## 2022-09-13 DIAGNOSIS — M800AXD Age-related osteoporosis with current pathological fracture, other site, subsequent encounter for fracture with routine healing: Secondary | ICD-10-CM | POA: Diagnosis not present

## 2022-09-13 DIAGNOSIS — Z743 Need for continuous supervision: Secondary | ICD-10-CM | POA: Diagnosis not present

## 2022-09-13 DIAGNOSIS — M6281 Muscle weakness (generalized): Secondary | ICD-10-CM | POA: Diagnosis not present

## 2022-09-13 DIAGNOSIS — R69 Illness, unspecified: Secondary | ICD-10-CM | POA: Diagnosis not present

## 2022-09-13 DIAGNOSIS — Z23 Encounter for immunization: Secondary | ICD-10-CM | POA: Diagnosis not present

## 2022-09-20 DIAGNOSIS — N3281 Overactive bladder: Secondary | ICD-10-CM | POA: Diagnosis not present

## 2022-09-20 DIAGNOSIS — G9009 Other idiopathic peripheral autonomic neuropathy: Secondary | ICD-10-CM | POA: Diagnosis not present

## 2022-09-20 DIAGNOSIS — I4891 Unspecified atrial fibrillation: Secondary | ICD-10-CM | POA: Diagnosis not present

## 2022-09-20 DIAGNOSIS — W19XXXA Unspecified fall, initial encounter: Secondary | ICD-10-CM | POA: Diagnosis not present

## 2022-09-20 DIAGNOSIS — I10 Essential (primary) hypertension: Secondary | ICD-10-CM | POA: Diagnosis not present

## 2022-09-20 DIAGNOSIS — M6281 Muscle weakness (generalized): Secondary | ICD-10-CM | POA: Diagnosis not present

## 2022-09-20 DIAGNOSIS — M800AXD Age-related osteoporosis with current pathological fracture, other site, subsequent encounter for fracture with routine healing: Secondary | ICD-10-CM | POA: Diagnosis not present

## 2022-09-22 DIAGNOSIS — M6281 Muscle weakness (generalized): Secondary | ICD-10-CM | POA: Diagnosis not present

## 2022-09-22 DIAGNOSIS — I10 Essential (primary) hypertension: Secondary | ICD-10-CM | POA: Diagnosis not present

## 2022-09-22 DIAGNOSIS — W19XXXA Unspecified fall, initial encounter: Secondary | ICD-10-CM | POA: Diagnosis not present

## 2022-09-22 DIAGNOSIS — I4891 Unspecified atrial fibrillation: Secondary | ICD-10-CM | POA: Diagnosis not present

## 2022-09-22 DIAGNOSIS — M800AXD Age-related osteoporosis with current pathological fracture, other site, subsequent encounter for fracture with routine healing: Secondary | ICD-10-CM | POA: Diagnosis not present

## 2022-09-22 DIAGNOSIS — C4491 Basal cell carcinoma of skin, unspecified: Secondary | ICD-10-CM | POA: Diagnosis not present

## 2022-09-25 DIAGNOSIS — G9009 Other idiopathic peripheral autonomic neuropathy: Secondary | ICD-10-CM | POA: Diagnosis not present

## 2022-09-25 DIAGNOSIS — N3281 Overactive bladder: Secondary | ICD-10-CM | POA: Diagnosis not present

## 2022-09-25 DIAGNOSIS — M800AXD Age-related osteoporosis with current pathological fracture, other site, subsequent encounter for fracture with routine healing: Secondary | ICD-10-CM | POA: Diagnosis not present

## 2022-09-25 DIAGNOSIS — M6281 Muscle weakness (generalized): Secondary | ICD-10-CM | POA: Diagnosis not present

## 2022-09-25 DIAGNOSIS — I10 Essential (primary) hypertension: Secondary | ICD-10-CM | POA: Diagnosis not present

## 2022-09-25 DIAGNOSIS — I4891 Unspecified atrial fibrillation: Secondary | ICD-10-CM | POA: Diagnosis not present

## 2022-09-26 DIAGNOSIS — I4891 Unspecified atrial fibrillation: Secondary | ICD-10-CM | POA: Diagnosis not present

## 2022-09-26 DIAGNOSIS — S2242XA Multiple fractures of ribs, left side, initial encounter for closed fracture: Secondary | ICD-10-CM | POA: Diagnosis not present

## 2022-09-26 DIAGNOSIS — Z8673 Personal history of transient ischemic attack (TIA), and cerebral infarction without residual deficits: Secondary | ICD-10-CM | POA: Diagnosis not present

## 2022-09-26 DIAGNOSIS — M810 Age-related osteoporosis without current pathological fracture: Secondary | ICD-10-CM | POA: Diagnosis not present

## 2022-09-26 DIAGNOSIS — I10 Essential (primary) hypertension: Secondary | ICD-10-CM | POA: Diagnosis not present

## 2022-09-27 DIAGNOSIS — M6281 Muscle weakness (generalized): Secondary | ICD-10-CM | POA: Diagnosis not present

## 2022-09-27 DIAGNOSIS — M800AXD Age-related osteoporosis with current pathological fracture, other site, subsequent encounter for fracture with routine healing: Secondary | ICD-10-CM | POA: Diagnosis not present

## 2022-09-27 DIAGNOSIS — I10 Essential (primary) hypertension: Secondary | ICD-10-CM | POA: Diagnosis not present

## 2022-10-02 DIAGNOSIS — I480 Paroxysmal atrial fibrillation: Secondary | ICD-10-CM | POA: Diagnosis not present

## 2022-10-02 DIAGNOSIS — I10 Essential (primary) hypertension: Secondary | ICD-10-CM | POA: Diagnosis not present

## 2022-10-02 DIAGNOSIS — G8911 Acute pain due to trauma: Secondary | ICD-10-CM | POA: Diagnosis not present

## 2022-10-02 DIAGNOSIS — G9009 Other idiopathic peripheral autonomic neuropathy: Secondary | ICD-10-CM | POA: Diagnosis not present

## 2022-10-02 DIAGNOSIS — N3281 Overactive bladder: Secondary | ICD-10-CM | POA: Diagnosis not present

## 2022-10-02 DIAGNOSIS — M800AXD Age-related osteoporosis with current pathological fracture, other site, subsequent encounter for fracture with routine healing: Secondary | ICD-10-CM | POA: Diagnosis not present

## 2022-10-03 DIAGNOSIS — R0609 Other forms of dyspnea: Secondary | ICD-10-CM | POA: Diagnosis not present

## 2022-10-04 DIAGNOSIS — I480 Paroxysmal atrial fibrillation: Secondary | ICD-10-CM | POA: Diagnosis not present

## 2022-10-04 DIAGNOSIS — G9009 Other idiopathic peripheral autonomic neuropathy: Secondary | ICD-10-CM | POA: Diagnosis not present

## 2022-10-04 DIAGNOSIS — I10 Essential (primary) hypertension: Secondary | ICD-10-CM | POA: Diagnosis not present

## 2022-10-04 DIAGNOSIS — N3281 Overactive bladder: Secondary | ICD-10-CM | POA: Diagnosis not present

## 2022-10-04 DIAGNOSIS — G8911 Acute pain due to trauma: Secondary | ICD-10-CM | POA: Diagnosis not present

## 2022-10-04 DIAGNOSIS — M800AXD Age-related osteoporosis with current pathological fracture, other site, subsequent encounter for fracture with routine healing: Secondary | ICD-10-CM | POA: Diagnosis not present

## 2022-10-05 DIAGNOSIS — I10 Essential (primary) hypertension: Secondary | ICD-10-CM | POA: Diagnosis not present

## 2022-10-05 DIAGNOSIS — M800AXD Age-related osteoporosis with current pathological fracture, other site, subsequent encounter for fracture with routine healing: Secondary | ICD-10-CM | POA: Diagnosis not present

## 2022-10-05 DIAGNOSIS — N3281 Overactive bladder: Secondary | ICD-10-CM | POA: Diagnosis not present

## 2022-10-05 DIAGNOSIS — I480 Paroxysmal atrial fibrillation: Secondary | ICD-10-CM | POA: Diagnosis not present

## 2022-10-05 DIAGNOSIS — G8911 Acute pain due to trauma: Secondary | ICD-10-CM | POA: Diagnosis not present

## 2022-10-05 DIAGNOSIS — G9009 Other idiopathic peripheral autonomic neuropathy: Secondary | ICD-10-CM | POA: Diagnosis not present

## 2022-10-06 DIAGNOSIS — C44612 Basal cell carcinoma of skin of right upper limb, including shoulder: Secondary | ICD-10-CM | POA: Diagnosis not present

## 2022-10-06 DIAGNOSIS — C44712 Basal cell carcinoma of skin of right lower limb, including hip: Secondary | ICD-10-CM | POA: Diagnosis not present

## 2022-10-09 DIAGNOSIS — G9009 Other idiopathic peripheral autonomic neuropathy: Secondary | ICD-10-CM | POA: Diagnosis not present

## 2022-10-09 DIAGNOSIS — I10 Essential (primary) hypertension: Secondary | ICD-10-CM | POA: Diagnosis not present

## 2022-10-09 DIAGNOSIS — G8911 Acute pain due to trauma: Secondary | ICD-10-CM | POA: Diagnosis not present

## 2022-10-09 DIAGNOSIS — I480 Paroxysmal atrial fibrillation: Secondary | ICD-10-CM | POA: Diagnosis not present

## 2022-10-09 DIAGNOSIS — N3281 Overactive bladder: Secondary | ICD-10-CM | POA: Diagnosis not present

## 2022-10-09 DIAGNOSIS — M800AXD Age-related osteoporosis with current pathological fracture, other site, subsequent encounter for fracture with routine healing: Secondary | ICD-10-CM | POA: Diagnosis not present

## 2022-10-11 DIAGNOSIS — G8911 Acute pain due to trauma: Secondary | ICD-10-CM | POA: Diagnosis not present

## 2022-10-11 DIAGNOSIS — Z23 Encounter for immunization: Secondary | ICD-10-CM | POA: Diagnosis not present

## 2022-10-11 DIAGNOSIS — S2232XD Fracture of one rib, left side, subsequent encounter for fracture with routine healing: Secondary | ICD-10-CM | POA: Diagnosis not present

## 2022-10-11 DIAGNOSIS — R2242 Localized swelling, mass and lump, left lower limb: Secondary | ICD-10-CM | POA: Diagnosis not present

## 2022-10-11 DIAGNOSIS — G9009 Other idiopathic peripheral autonomic neuropathy: Secondary | ICD-10-CM | POA: Diagnosis not present

## 2022-10-11 DIAGNOSIS — N3281 Overactive bladder: Secondary | ICD-10-CM | POA: Diagnosis not present

## 2022-10-11 DIAGNOSIS — I10 Essential (primary) hypertension: Secondary | ICD-10-CM | POA: Diagnosis not present

## 2022-10-11 DIAGNOSIS — I480 Paroxysmal atrial fibrillation: Secondary | ICD-10-CM | POA: Diagnosis not present

## 2022-10-11 DIAGNOSIS — R296 Repeated falls: Secondary | ICD-10-CM | POA: Diagnosis not present

## 2022-10-11 DIAGNOSIS — C4492 Squamous cell carcinoma of skin, unspecified: Secondary | ICD-10-CM | POA: Diagnosis not present

## 2022-10-11 DIAGNOSIS — M800AXD Age-related osteoporosis with current pathological fracture, other site, subsequent encounter for fracture with routine healing: Secondary | ICD-10-CM | POA: Diagnosis not present

## 2022-10-12 DIAGNOSIS — I739 Peripheral vascular disease, unspecified: Secondary | ICD-10-CM | POA: Diagnosis not present

## 2022-10-12 DIAGNOSIS — G4733 Obstructive sleep apnea (adult) (pediatric): Secondary | ICD-10-CM | POA: Diagnosis not present

## 2022-10-12 DIAGNOSIS — N3281 Overactive bladder: Secondary | ICD-10-CM | POA: Diagnosis not present

## 2022-10-12 DIAGNOSIS — R0609 Other forms of dyspnea: Secondary | ICD-10-CM | POA: Diagnosis not present

## 2022-10-12 DIAGNOSIS — G9009 Other idiopathic peripheral autonomic neuropathy: Secondary | ICD-10-CM | POA: Diagnosis not present

## 2022-10-12 DIAGNOSIS — M800AXD Age-related osteoporosis with current pathological fracture, other site, subsequent encounter for fracture with routine healing: Secondary | ICD-10-CM | POA: Diagnosis not present

## 2022-10-12 DIAGNOSIS — I4891 Unspecified atrial fibrillation: Secondary | ICD-10-CM | POA: Diagnosis not present

## 2022-10-12 DIAGNOSIS — I480 Paroxysmal atrial fibrillation: Secondary | ICD-10-CM | POA: Diagnosis not present

## 2022-10-12 DIAGNOSIS — G8911 Acute pain due to trauma: Secondary | ICD-10-CM | POA: Diagnosis not present

## 2022-10-12 DIAGNOSIS — I10 Essential (primary) hypertension: Secondary | ICD-10-CM | POA: Diagnosis not present

## 2022-10-13 DIAGNOSIS — I7789 Other specified disorders of arteries and arterioles: Secondary | ICD-10-CM | POA: Diagnosis not present

## 2022-10-13 DIAGNOSIS — R600 Localized edema: Secondary | ICD-10-CM | POA: Diagnosis not present

## 2022-10-13 DIAGNOSIS — I739 Peripheral vascular disease, unspecified: Secondary | ICD-10-CM | POA: Diagnosis not present

## 2022-10-17 DIAGNOSIS — M800AXD Age-related osteoporosis with current pathological fracture, other site, subsequent encounter for fracture with routine healing: Secondary | ICD-10-CM | POA: Diagnosis not present

## 2022-10-17 DIAGNOSIS — G9009 Other idiopathic peripheral autonomic neuropathy: Secondary | ICD-10-CM | POA: Diagnosis not present

## 2022-10-17 DIAGNOSIS — I480 Paroxysmal atrial fibrillation: Secondary | ICD-10-CM | POA: Diagnosis not present

## 2022-10-17 DIAGNOSIS — G8911 Acute pain due to trauma: Secondary | ICD-10-CM | POA: Diagnosis not present

## 2022-10-17 DIAGNOSIS — N3281 Overactive bladder: Secondary | ICD-10-CM | POA: Diagnosis not present

## 2022-10-17 DIAGNOSIS — I10 Essential (primary) hypertension: Secondary | ICD-10-CM | POA: Diagnosis not present

## 2022-10-18 DIAGNOSIS — N3281 Overactive bladder: Secondary | ICD-10-CM | POA: Diagnosis not present

## 2022-10-18 DIAGNOSIS — I480 Paroxysmal atrial fibrillation: Secondary | ICD-10-CM | POA: Diagnosis not present

## 2022-10-18 DIAGNOSIS — M800AXD Age-related osteoporosis with current pathological fracture, other site, subsequent encounter for fracture with routine healing: Secondary | ICD-10-CM | POA: Diagnosis not present

## 2022-10-18 DIAGNOSIS — I10 Essential (primary) hypertension: Secondary | ICD-10-CM | POA: Diagnosis not present

## 2022-10-18 DIAGNOSIS — G8911 Acute pain due to trauma: Secondary | ICD-10-CM | POA: Diagnosis not present

## 2022-10-18 DIAGNOSIS — G9009 Other idiopathic peripheral autonomic neuropathy: Secondary | ICD-10-CM | POA: Diagnosis not present

## 2022-10-19 DIAGNOSIS — G8911 Acute pain due to trauma: Secondary | ICD-10-CM | POA: Diagnosis not present

## 2022-10-19 DIAGNOSIS — I10 Essential (primary) hypertension: Secondary | ICD-10-CM | POA: Diagnosis not present

## 2022-10-19 DIAGNOSIS — M800AXD Age-related osteoporosis with current pathological fracture, other site, subsequent encounter for fracture with routine healing: Secondary | ICD-10-CM | POA: Diagnosis not present

## 2022-10-19 DIAGNOSIS — I480 Paroxysmal atrial fibrillation: Secondary | ICD-10-CM | POA: Diagnosis not present

## 2022-10-19 DIAGNOSIS — N3281 Overactive bladder: Secondary | ICD-10-CM | POA: Diagnosis not present

## 2022-10-19 DIAGNOSIS — G9009 Other idiopathic peripheral autonomic neuropathy: Secondary | ICD-10-CM | POA: Diagnosis not present

## 2022-10-24 DIAGNOSIS — G9009 Other idiopathic peripheral autonomic neuropathy: Secondary | ICD-10-CM | POA: Diagnosis not present

## 2022-10-24 DIAGNOSIS — I10 Essential (primary) hypertension: Secondary | ICD-10-CM | POA: Diagnosis not present

## 2022-10-24 DIAGNOSIS — N3281 Overactive bladder: Secondary | ICD-10-CM | POA: Diagnosis not present

## 2022-10-24 DIAGNOSIS — G8911 Acute pain due to trauma: Secondary | ICD-10-CM | POA: Diagnosis not present

## 2022-10-24 DIAGNOSIS — M800AXD Age-related osteoporosis with current pathological fracture, other site, subsequent encounter for fracture with routine healing: Secondary | ICD-10-CM | POA: Diagnosis not present

## 2022-10-24 DIAGNOSIS — I480 Paroxysmal atrial fibrillation: Secondary | ICD-10-CM | POA: Diagnosis not present

## 2022-10-26 DIAGNOSIS — G8911 Acute pain due to trauma: Secondary | ICD-10-CM | POA: Diagnosis not present

## 2022-10-26 DIAGNOSIS — N3281 Overactive bladder: Secondary | ICD-10-CM | POA: Diagnosis not present

## 2022-10-26 DIAGNOSIS — I10 Essential (primary) hypertension: Secondary | ICD-10-CM | POA: Diagnosis not present

## 2022-10-26 DIAGNOSIS — I480 Paroxysmal atrial fibrillation: Secondary | ICD-10-CM | POA: Diagnosis not present

## 2022-10-26 DIAGNOSIS — M800AXD Age-related osteoporosis with current pathological fracture, other site, subsequent encounter for fracture with routine healing: Secondary | ICD-10-CM | POA: Diagnosis not present

## 2022-10-26 DIAGNOSIS — G9009 Other idiopathic peripheral autonomic neuropathy: Secondary | ICD-10-CM | POA: Diagnosis not present

## 2022-10-27 ENCOUNTER — Telehealth: Payer: Self-pay | Admitting: Family Medicine

## 2022-10-27 NOTE — Telephone Encounter (Signed)
I called patient to schedule AWV. Patient said she has a new pcp, ?Dr Sherren Kerns, in [place], [place]. Please remove PCP.

## 2022-10-30 DIAGNOSIS — G8911 Acute pain due to trauma: Secondary | ICD-10-CM | POA: Diagnosis not present

## 2022-10-30 DIAGNOSIS — N3281 Overactive bladder: Secondary | ICD-10-CM | POA: Diagnosis not present

## 2022-10-30 DIAGNOSIS — M800AXD Age-related osteoporosis with current pathological fracture, other site, subsequent encounter for fracture with routine healing: Secondary | ICD-10-CM | POA: Diagnosis not present

## 2022-10-30 DIAGNOSIS — I480 Paroxysmal atrial fibrillation: Secondary | ICD-10-CM | POA: Diagnosis not present

## 2022-10-30 DIAGNOSIS — I10 Essential (primary) hypertension: Secondary | ICD-10-CM | POA: Diagnosis not present

## 2022-10-30 DIAGNOSIS — G9009 Other idiopathic peripheral autonomic neuropathy: Secondary | ICD-10-CM | POA: Diagnosis not present

## 2022-11-01 DIAGNOSIS — I10 Essential (primary) hypertension: Secondary | ICD-10-CM | POA: Diagnosis not present

## 2022-11-01 DIAGNOSIS — I480 Paroxysmal atrial fibrillation: Secondary | ICD-10-CM | POA: Diagnosis not present

## 2022-11-01 DIAGNOSIS — G8911 Acute pain due to trauma: Secondary | ICD-10-CM | POA: Diagnosis not present

## 2022-11-01 DIAGNOSIS — M800AXD Age-related osteoporosis with current pathological fracture, other site, subsequent encounter for fracture with routine healing: Secondary | ICD-10-CM | POA: Diagnosis not present

## 2022-11-01 DIAGNOSIS — G9009 Other idiopathic peripheral autonomic neuropathy: Secondary | ICD-10-CM | POA: Diagnosis not present

## 2022-11-01 DIAGNOSIS — N3281 Overactive bladder: Secondary | ICD-10-CM | POA: Diagnosis not present

## 2022-11-02 DIAGNOSIS — I70213 Atherosclerosis of native arteries of extremities with intermittent claudication, bilateral legs: Secondary | ICD-10-CM | POA: Diagnosis not present

## 2022-11-02 DIAGNOSIS — M7989 Other specified soft tissue disorders: Secondary | ICD-10-CM | POA: Diagnosis not present

## 2022-11-02 DIAGNOSIS — I10 Essential (primary) hypertension: Secondary | ICD-10-CM | POA: Diagnosis not present

## 2022-11-02 DIAGNOSIS — I739 Peripheral vascular disease, unspecified: Secondary | ICD-10-CM | POA: Diagnosis not present

## 2022-11-02 DIAGNOSIS — I480 Paroxysmal atrial fibrillation: Secondary | ICD-10-CM | POA: Diagnosis not present

## 2022-11-02 DIAGNOSIS — N3281 Overactive bladder: Secondary | ICD-10-CM | POA: Diagnosis not present

## 2022-11-02 DIAGNOSIS — G9009 Other idiopathic peripheral autonomic neuropathy: Secondary | ICD-10-CM | POA: Diagnosis not present

## 2022-11-02 DIAGNOSIS — M800AXD Age-related osteoporosis with current pathological fracture, other site, subsequent encounter for fracture with routine healing: Secondary | ICD-10-CM | POA: Diagnosis not present

## 2022-11-02 DIAGNOSIS — G8911 Acute pain due to trauma: Secondary | ICD-10-CM | POA: Diagnosis not present

## 2022-11-06 ENCOUNTER — Other Ambulatory Visit: Payer: Self-pay | Admitting: Family Medicine

## 2022-11-06 DIAGNOSIS — I10 Essential (primary) hypertension: Secondary | ICD-10-CM

## 2022-11-07 DIAGNOSIS — I10 Essential (primary) hypertension: Secondary | ICD-10-CM | POA: Diagnosis not present

## 2022-11-07 DIAGNOSIS — G8911 Acute pain due to trauma: Secondary | ICD-10-CM | POA: Diagnosis not present

## 2022-11-07 DIAGNOSIS — N3281 Overactive bladder: Secondary | ICD-10-CM | POA: Diagnosis not present

## 2022-11-07 DIAGNOSIS — G9009 Other idiopathic peripheral autonomic neuropathy: Secondary | ICD-10-CM | POA: Diagnosis not present

## 2022-11-07 DIAGNOSIS — M800AXD Age-related osteoporosis with current pathological fracture, other site, subsequent encounter for fracture with routine healing: Secondary | ICD-10-CM | POA: Diagnosis not present

## 2022-11-07 DIAGNOSIS — I480 Paroxysmal atrial fibrillation: Secondary | ICD-10-CM | POA: Diagnosis not present

## 2022-11-08 DIAGNOSIS — S7001XA Contusion of right hip, initial encounter: Secondary | ICD-10-CM | POA: Diagnosis not present

## 2022-11-08 DIAGNOSIS — M545 Low back pain, unspecified: Secondary | ICD-10-CM | POA: Diagnosis not present

## 2022-11-08 DIAGNOSIS — S3992XA Unspecified injury of lower back, initial encounter: Secondary | ICD-10-CM | POA: Diagnosis not present

## 2022-11-08 DIAGNOSIS — S76011A Strain of muscle, fascia and tendon of right hip, initial encounter: Secondary | ICD-10-CM | POA: Diagnosis not present

## 2022-11-08 DIAGNOSIS — S76001A Unspecified injury of muscle, fascia and tendon of right hip, initial encounter: Secondary | ICD-10-CM | POA: Diagnosis not present

## 2022-11-08 DIAGNOSIS — W1830XA Fall on same level, unspecified, initial encounter: Secondary | ICD-10-CM | POA: Diagnosis not present

## 2022-11-08 DIAGNOSIS — S3993XA Unspecified injury of pelvis, initial encounter: Secondary | ICD-10-CM | POA: Diagnosis not present

## 2022-11-08 DIAGNOSIS — S300XXA Contusion of lower back and pelvis, initial encounter: Secondary | ICD-10-CM | POA: Diagnosis not present

## 2022-11-14 DIAGNOSIS — I480 Paroxysmal atrial fibrillation: Secondary | ICD-10-CM | POA: Diagnosis not present

## 2022-11-14 DIAGNOSIS — I10 Essential (primary) hypertension: Secondary | ICD-10-CM | POA: Diagnosis not present

## 2022-11-14 DIAGNOSIS — G8911 Acute pain due to trauma: Secondary | ICD-10-CM | POA: Diagnosis not present

## 2022-11-14 DIAGNOSIS — M800AXD Age-related osteoporosis with current pathological fracture, other site, subsequent encounter for fracture with routine healing: Secondary | ICD-10-CM | POA: Diagnosis not present

## 2022-11-14 DIAGNOSIS — G9009 Other idiopathic peripheral autonomic neuropathy: Secondary | ICD-10-CM | POA: Diagnosis not present

## 2022-11-14 DIAGNOSIS — N3281 Overactive bladder: Secondary | ICD-10-CM | POA: Diagnosis not present

## 2022-11-15 DIAGNOSIS — N3281 Overactive bladder: Secondary | ICD-10-CM | POA: Diagnosis not present

## 2022-11-15 DIAGNOSIS — G9009 Other idiopathic peripheral autonomic neuropathy: Secondary | ICD-10-CM | POA: Diagnosis not present

## 2022-11-15 DIAGNOSIS — G8911 Acute pain due to trauma: Secondary | ICD-10-CM | POA: Diagnosis not present

## 2022-11-15 DIAGNOSIS — M800AXD Age-related osteoporosis with current pathological fracture, other site, subsequent encounter for fracture with routine healing: Secondary | ICD-10-CM | POA: Diagnosis not present

## 2022-11-15 DIAGNOSIS — I10 Essential (primary) hypertension: Secondary | ICD-10-CM | POA: Diagnosis not present

## 2022-11-15 DIAGNOSIS — I480 Paroxysmal atrial fibrillation: Secondary | ICD-10-CM | POA: Diagnosis not present

## 2022-11-16 DIAGNOSIS — M800AXD Age-related osteoporosis with current pathological fracture, other site, subsequent encounter for fracture with routine healing: Secondary | ICD-10-CM | POA: Diagnosis not present

## 2022-11-16 DIAGNOSIS — I480 Paroxysmal atrial fibrillation: Secondary | ICD-10-CM | POA: Diagnosis not present

## 2022-11-16 DIAGNOSIS — N3281 Overactive bladder: Secondary | ICD-10-CM | POA: Diagnosis not present

## 2022-11-16 DIAGNOSIS — G9009 Other idiopathic peripheral autonomic neuropathy: Secondary | ICD-10-CM | POA: Diagnosis not present

## 2022-11-16 DIAGNOSIS — G8911 Acute pain due to trauma: Secondary | ICD-10-CM | POA: Diagnosis not present

## 2022-11-16 DIAGNOSIS — I10 Essential (primary) hypertension: Secondary | ICD-10-CM | POA: Diagnosis not present

## 2022-11-20 DIAGNOSIS — I10 Essential (primary) hypertension: Secondary | ICD-10-CM | POA: Diagnosis not present

## 2022-11-20 DIAGNOSIS — I480 Paroxysmal atrial fibrillation: Secondary | ICD-10-CM | POA: Diagnosis not present

## 2022-11-20 DIAGNOSIS — N3281 Overactive bladder: Secondary | ICD-10-CM | POA: Diagnosis not present

## 2022-11-20 DIAGNOSIS — G8911 Acute pain due to trauma: Secondary | ICD-10-CM | POA: Diagnosis not present

## 2022-11-20 DIAGNOSIS — M800AXD Age-related osteoporosis with current pathological fracture, other site, subsequent encounter for fracture with routine healing: Secondary | ICD-10-CM | POA: Diagnosis not present

## 2022-11-20 DIAGNOSIS — G9009 Other idiopathic peripheral autonomic neuropathy: Secondary | ICD-10-CM | POA: Diagnosis not present

## 2022-11-21 DIAGNOSIS — G8911 Acute pain due to trauma: Secondary | ICD-10-CM | POA: Diagnosis not present

## 2022-11-21 DIAGNOSIS — I480 Paroxysmal atrial fibrillation: Secondary | ICD-10-CM | POA: Diagnosis not present

## 2022-11-21 DIAGNOSIS — M800AXD Age-related osteoporosis with current pathological fracture, other site, subsequent encounter for fracture with routine healing: Secondary | ICD-10-CM | POA: Diagnosis not present

## 2022-11-21 DIAGNOSIS — N3281 Overactive bladder: Secondary | ICD-10-CM | POA: Diagnosis not present

## 2022-11-21 DIAGNOSIS — I10 Essential (primary) hypertension: Secondary | ICD-10-CM | POA: Diagnosis not present

## 2022-11-21 DIAGNOSIS — G9009 Other idiopathic peripheral autonomic neuropathy: Secondary | ICD-10-CM | POA: Diagnosis not present

## 2022-11-30 DIAGNOSIS — G8911 Acute pain due to trauma: Secondary | ICD-10-CM | POA: Diagnosis not present

## 2022-11-30 DIAGNOSIS — I10 Essential (primary) hypertension: Secondary | ICD-10-CM | POA: Diagnosis not present

## 2022-11-30 DIAGNOSIS — G9009 Other idiopathic peripheral autonomic neuropathy: Secondary | ICD-10-CM | POA: Diagnosis not present

## 2022-11-30 DIAGNOSIS — N3281 Overactive bladder: Secondary | ICD-10-CM | POA: Diagnosis not present

## 2022-11-30 DIAGNOSIS — I480 Paroxysmal atrial fibrillation: Secondary | ICD-10-CM | POA: Diagnosis not present

## 2022-11-30 DIAGNOSIS — M800AXD Age-related osteoporosis with current pathological fracture, other site, subsequent encounter for fracture with routine healing: Secondary | ICD-10-CM | POA: Diagnosis not present

## 2022-12-05 DIAGNOSIS — I10 Essential (primary) hypertension: Secondary | ICD-10-CM | POA: Diagnosis not present

## 2022-12-05 DIAGNOSIS — G8911 Acute pain due to trauma: Secondary | ICD-10-CM | POA: Diagnosis not present

## 2022-12-05 DIAGNOSIS — N3281 Overactive bladder: Secondary | ICD-10-CM | POA: Diagnosis not present

## 2022-12-05 DIAGNOSIS — M800AXD Age-related osteoporosis with current pathological fracture, other site, subsequent encounter for fracture with routine healing: Secondary | ICD-10-CM | POA: Diagnosis not present

## 2022-12-05 DIAGNOSIS — G9009 Other idiopathic peripheral autonomic neuropathy: Secondary | ICD-10-CM | POA: Diagnosis not present

## 2022-12-05 DIAGNOSIS — I480 Paroxysmal atrial fibrillation: Secondary | ICD-10-CM | POA: Diagnosis not present

## 2022-12-08 DIAGNOSIS — I480 Paroxysmal atrial fibrillation: Secondary | ICD-10-CM | POA: Diagnosis not present

## 2022-12-08 DIAGNOSIS — G9009 Other idiopathic peripheral autonomic neuropathy: Secondary | ICD-10-CM | POA: Diagnosis not present

## 2022-12-08 DIAGNOSIS — G8911 Acute pain due to trauma: Secondary | ICD-10-CM | POA: Diagnosis not present

## 2022-12-08 DIAGNOSIS — I10 Essential (primary) hypertension: Secondary | ICD-10-CM | POA: Diagnosis not present

## 2022-12-08 DIAGNOSIS — M800AXD Age-related osteoporosis with current pathological fracture, other site, subsequent encounter for fracture with routine healing: Secondary | ICD-10-CM | POA: Diagnosis not present

## 2022-12-08 DIAGNOSIS — N3281 Overactive bladder: Secondary | ICD-10-CM | POA: Diagnosis not present

## 2022-12-13 DIAGNOSIS — M800AXD Age-related osteoporosis with current pathological fracture, other site, subsequent encounter for fracture with routine healing: Secondary | ICD-10-CM | POA: Diagnosis not present

## 2022-12-13 DIAGNOSIS — I480 Paroxysmal atrial fibrillation: Secondary | ICD-10-CM | POA: Diagnosis not present

## 2022-12-13 DIAGNOSIS — I10 Essential (primary) hypertension: Secondary | ICD-10-CM | POA: Diagnosis not present

## 2022-12-13 DIAGNOSIS — G8911 Acute pain due to trauma: Secondary | ICD-10-CM | POA: Diagnosis not present

## 2022-12-13 DIAGNOSIS — G9009 Other idiopathic peripheral autonomic neuropathy: Secondary | ICD-10-CM | POA: Diagnosis not present

## 2022-12-13 DIAGNOSIS — N3281 Overactive bladder: Secondary | ICD-10-CM | POA: Diagnosis not present

## 2022-12-14 DIAGNOSIS — G8911 Acute pain due to trauma: Secondary | ICD-10-CM | POA: Diagnosis not present

## 2022-12-14 DIAGNOSIS — I739 Peripheral vascular disease, unspecified: Secondary | ICD-10-CM | POA: Diagnosis not present

## 2022-12-14 DIAGNOSIS — I480 Paroxysmal atrial fibrillation: Secondary | ICD-10-CM | POA: Diagnosis not present

## 2022-12-14 DIAGNOSIS — N3281 Overactive bladder: Secondary | ICD-10-CM | POA: Diagnosis not present

## 2022-12-14 DIAGNOSIS — G9009 Other idiopathic peripheral autonomic neuropathy: Secondary | ICD-10-CM | POA: Diagnosis not present

## 2022-12-14 DIAGNOSIS — R2681 Unsteadiness on feet: Secondary | ICD-10-CM | POA: Diagnosis not present

## 2022-12-14 DIAGNOSIS — M800AXD Age-related osteoporosis with current pathological fracture, other site, subsequent encounter for fracture with routine healing: Secondary | ICD-10-CM | POA: Diagnosis not present

## 2022-12-14 DIAGNOSIS — I10 Essential (primary) hypertension: Secondary | ICD-10-CM | POA: Diagnosis not present

## 2022-12-14 DIAGNOSIS — I6990 Unspecified sequelae of unspecified cerebrovascular disease: Secondary | ICD-10-CM | POA: Diagnosis not present

## 2022-12-18 DIAGNOSIS — M79605 Pain in left leg: Secondary | ICD-10-CM | POA: Diagnosis not present

## 2022-12-18 DIAGNOSIS — Z01818 Encounter for other preprocedural examination: Secondary | ICD-10-CM | POA: Diagnosis not present

## 2022-12-18 DIAGNOSIS — M79604 Pain in right leg: Secondary | ICD-10-CM | POA: Diagnosis not present

## 2022-12-18 DIAGNOSIS — I83813 Varicose veins of bilateral lower extremities with pain: Secondary | ICD-10-CM | POA: Diagnosis not present

## 2022-12-19 DIAGNOSIS — G9009 Other idiopathic peripheral autonomic neuropathy: Secondary | ICD-10-CM | POA: Diagnosis not present

## 2022-12-19 DIAGNOSIS — I10 Essential (primary) hypertension: Secondary | ICD-10-CM | POA: Diagnosis not present

## 2022-12-19 DIAGNOSIS — I480 Paroxysmal atrial fibrillation: Secondary | ICD-10-CM | POA: Diagnosis not present

## 2022-12-19 DIAGNOSIS — G8911 Acute pain due to trauma: Secondary | ICD-10-CM | POA: Diagnosis not present

## 2022-12-19 DIAGNOSIS — M800AXD Age-related osteoporosis with current pathological fracture, other site, subsequent encounter for fracture with routine healing: Secondary | ICD-10-CM | POA: Diagnosis not present

## 2022-12-19 DIAGNOSIS — N3281 Overactive bladder: Secondary | ICD-10-CM | POA: Diagnosis not present

## 2022-12-20 DIAGNOSIS — M800AXD Age-related osteoporosis with current pathological fracture, other site, subsequent encounter for fracture with routine healing: Secondary | ICD-10-CM | POA: Diagnosis not present

## 2022-12-20 DIAGNOSIS — G9009 Other idiopathic peripheral autonomic neuropathy: Secondary | ICD-10-CM | POA: Diagnosis not present

## 2022-12-20 DIAGNOSIS — N3281 Overactive bladder: Secondary | ICD-10-CM | POA: Diagnosis not present

## 2022-12-20 DIAGNOSIS — G8911 Acute pain due to trauma: Secondary | ICD-10-CM | POA: Diagnosis not present

## 2022-12-20 DIAGNOSIS — I10 Essential (primary) hypertension: Secondary | ICD-10-CM | POA: Diagnosis not present

## 2022-12-20 DIAGNOSIS — I480 Paroxysmal atrial fibrillation: Secondary | ICD-10-CM | POA: Diagnosis not present

## 2022-12-21 DIAGNOSIS — R296 Repeated falls: Secondary | ICD-10-CM | POA: Diagnosis not present

## 2022-12-21 DIAGNOSIS — R2242 Localized swelling, mass and lump, left lower limb: Secondary | ICD-10-CM | POA: Diagnosis not present

## 2022-12-21 DIAGNOSIS — H539 Unspecified visual disturbance: Secondary | ICD-10-CM | POA: Diagnosis not present

## 2022-12-21 DIAGNOSIS — R413 Other amnesia: Secondary | ICD-10-CM | POA: Diagnosis not present

## 2022-12-21 DIAGNOSIS — C4492 Squamous cell carcinoma of skin, unspecified: Secondary | ICD-10-CM | POA: Diagnosis not present

## 2022-12-26 DIAGNOSIS — I480 Paroxysmal atrial fibrillation: Secondary | ICD-10-CM | POA: Diagnosis not present

## 2022-12-26 DIAGNOSIS — G8911 Acute pain due to trauma: Secondary | ICD-10-CM | POA: Diagnosis not present

## 2022-12-26 DIAGNOSIS — G9009 Other idiopathic peripheral autonomic neuropathy: Secondary | ICD-10-CM | POA: Diagnosis not present

## 2022-12-26 DIAGNOSIS — I10 Essential (primary) hypertension: Secondary | ICD-10-CM | POA: Diagnosis not present

## 2022-12-26 DIAGNOSIS — M800AXD Age-related osteoporosis with current pathological fracture, other site, subsequent encounter for fracture with routine healing: Secondary | ICD-10-CM | POA: Diagnosis not present

## 2022-12-26 DIAGNOSIS — N3281 Overactive bladder: Secondary | ICD-10-CM | POA: Diagnosis not present

## 2022-12-27 DIAGNOSIS — N3281 Overactive bladder: Secondary | ICD-10-CM | POA: Diagnosis not present

## 2022-12-27 DIAGNOSIS — M800AXD Age-related osteoporosis with current pathological fracture, other site, subsequent encounter for fracture with routine healing: Secondary | ICD-10-CM | POA: Diagnosis not present

## 2022-12-27 DIAGNOSIS — G9009 Other idiopathic peripheral autonomic neuropathy: Secondary | ICD-10-CM | POA: Diagnosis not present

## 2022-12-27 DIAGNOSIS — I480 Paroxysmal atrial fibrillation: Secondary | ICD-10-CM | POA: Diagnosis not present

## 2022-12-27 DIAGNOSIS — I10 Essential (primary) hypertension: Secondary | ICD-10-CM | POA: Diagnosis not present

## 2022-12-27 DIAGNOSIS — G8911 Acute pain due to trauma: Secondary | ICD-10-CM | POA: Diagnosis not present

## 2023-01-04 DIAGNOSIS — G8911 Acute pain due to trauma: Secondary | ICD-10-CM | POA: Diagnosis not present

## 2023-01-04 DIAGNOSIS — N3281 Overactive bladder: Secondary | ICD-10-CM | POA: Diagnosis not present

## 2023-01-04 DIAGNOSIS — I480 Paroxysmal atrial fibrillation: Secondary | ICD-10-CM | POA: Diagnosis not present

## 2023-01-04 DIAGNOSIS — G9009 Other idiopathic peripheral autonomic neuropathy: Secondary | ICD-10-CM | POA: Diagnosis not present

## 2023-01-04 DIAGNOSIS — I10 Essential (primary) hypertension: Secondary | ICD-10-CM | POA: Diagnosis not present

## 2023-01-04 DIAGNOSIS — M800AXD Age-related osteoporosis with current pathological fracture, other site, subsequent encounter for fracture with routine healing: Secondary | ICD-10-CM | POA: Diagnosis not present

## 2023-01-16 DIAGNOSIS — I10 Essential (primary) hypertension: Secondary | ICD-10-CM | POA: Diagnosis not present

## 2023-01-16 DIAGNOSIS — I480 Paroxysmal atrial fibrillation: Secondary | ICD-10-CM | POA: Diagnosis not present

## 2023-01-16 DIAGNOSIS — N3281 Overactive bladder: Secondary | ICD-10-CM | POA: Diagnosis not present

## 2023-01-16 DIAGNOSIS — G9009 Other idiopathic peripheral autonomic neuropathy: Secondary | ICD-10-CM | POA: Diagnosis not present

## 2023-01-16 DIAGNOSIS — G8911 Acute pain due to trauma: Secondary | ICD-10-CM | POA: Diagnosis not present

## 2023-01-16 DIAGNOSIS — M800AXD Age-related osteoporosis with current pathological fracture, other site, subsequent encounter for fracture with routine healing: Secondary | ICD-10-CM | POA: Diagnosis not present

## 2023-01-17 DIAGNOSIS — R001 Bradycardia, unspecified: Secondary | ICD-10-CM | POA: Diagnosis not present

## 2023-01-17 DIAGNOSIS — R197 Diarrhea, unspecified: Secondary | ICD-10-CM | POA: Diagnosis not present

## 2023-01-17 DIAGNOSIS — J9691 Respiratory failure, unspecified with hypoxia: Secondary | ICD-10-CM | POA: Diagnosis not present

## 2023-01-17 DIAGNOSIS — Z7401 Bed confinement status: Secondary | ICD-10-CM | POA: Diagnosis not present

## 2023-01-17 DIAGNOSIS — N3281 Overactive bladder: Secondary | ICD-10-CM | POA: Diagnosis not present

## 2023-01-17 DIAGNOSIS — R6889 Other general symptoms and signs: Secondary | ICD-10-CM | POA: Diagnosis not present

## 2023-01-17 DIAGNOSIS — I4891 Unspecified atrial fibrillation: Secondary | ICD-10-CM | POA: Diagnosis not present

## 2023-01-17 DIAGNOSIS — M6281 Muscle weakness (generalized): Secondary | ICD-10-CM | POA: Diagnosis not present

## 2023-01-17 DIAGNOSIS — Z79891 Long term (current) use of opiate analgesic: Secondary | ICD-10-CM | POA: Diagnosis not present

## 2023-01-17 DIAGNOSIS — Z7901 Long term (current) use of anticoagulants: Secondary | ICD-10-CM | POA: Diagnosis not present

## 2023-01-17 DIAGNOSIS — J90 Pleural effusion, not elsewhere classified: Secondary | ICD-10-CM | POA: Diagnosis not present

## 2023-01-17 DIAGNOSIS — B372 Candidiasis of skin and nail: Secondary | ICD-10-CM | POA: Diagnosis not present

## 2023-01-17 DIAGNOSIS — I10 Essential (primary) hypertension: Secondary | ICD-10-CM | POA: Diagnosis not present

## 2023-01-17 DIAGNOSIS — R109 Unspecified abdominal pain: Secondary | ICD-10-CM | POA: Diagnosis not present

## 2023-01-17 DIAGNOSIS — R531 Weakness: Secondary | ICD-10-CM | POA: Diagnosis not present

## 2023-01-17 DIAGNOSIS — G629 Polyneuropathy, unspecified: Secondary | ICD-10-CM | POA: Diagnosis not present

## 2023-01-17 DIAGNOSIS — M8000XD Age-related osteoporosis with current pathological fracture, unspecified site, subsequent encounter for fracture with routine healing: Secondary | ICD-10-CM | POA: Diagnosis not present

## 2023-01-17 DIAGNOSIS — Z743 Need for continuous supervision: Secondary | ICD-10-CM | POA: Diagnosis not present

## 2023-01-17 DIAGNOSIS — A084 Viral intestinal infection, unspecified: Secondary | ICD-10-CM | POA: Diagnosis not present

## 2023-01-17 DIAGNOSIS — J810 Acute pulmonary edema: Secondary | ICD-10-CM | POA: Diagnosis not present

## 2023-01-17 DIAGNOSIS — Z79899 Other long term (current) drug therapy: Secondary | ICD-10-CM | POA: Diagnosis not present

## 2023-01-17 DIAGNOSIS — R918 Other nonspecific abnormal finding of lung field: Secondary | ICD-10-CM | POA: Diagnosis not present

## 2023-01-17 DIAGNOSIS — I499 Cardiac arrhythmia, unspecified: Secondary | ICD-10-CM | POA: Diagnosis not present

## 2023-01-17 DIAGNOSIS — I509 Heart failure, unspecified: Secondary | ICD-10-CM | POA: Diagnosis not present

## 2023-01-17 DIAGNOSIS — I480 Paroxysmal atrial fibrillation: Secondary | ICD-10-CM | POA: Diagnosis not present

## 2023-01-17 DIAGNOSIS — G9009 Other idiopathic peripheral autonomic neuropathy: Secondary | ICD-10-CM | POA: Diagnosis not present

## 2023-01-17 DIAGNOSIS — I4821 Permanent atrial fibrillation: Secondary | ICD-10-CM | POA: Diagnosis not present

## 2023-01-18 DIAGNOSIS — R197 Diarrhea, unspecified: Secondary | ICD-10-CM | POA: Diagnosis not present

## 2023-01-18 DIAGNOSIS — R531 Weakness: Secondary | ICD-10-CM | POA: Diagnosis not present

## 2023-01-18 DIAGNOSIS — R109 Unspecified abdominal pain: Secondary | ICD-10-CM | POA: Diagnosis not present

## 2023-01-18 DIAGNOSIS — J90 Pleural effusion, not elsewhere classified: Secondary | ICD-10-CM | POA: Diagnosis not present

## 2023-01-18 DIAGNOSIS — I4891 Unspecified atrial fibrillation: Secondary | ICD-10-CM | POA: Diagnosis not present

## 2023-01-19 DIAGNOSIS — R531 Weakness: Secondary | ICD-10-CM | POA: Diagnosis not present

## 2023-01-19 DIAGNOSIS — R197 Diarrhea, unspecified: Secondary | ICD-10-CM | POA: Diagnosis not present

## 2023-01-19 DIAGNOSIS — I4891 Unspecified atrial fibrillation: Secondary | ICD-10-CM | POA: Diagnosis not present

## 2023-01-20 DIAGNOSIS — R531 Weakness: Secondary | ICD-10-CM | POA: Diagnosis not present

## 2023-01-20 DIAGNOSIS — I4891 Unspecified atrial fibrillation: Secondary | ICD-10-CM | POA: Diagnosis not present

## 2023-01-20 DIAGNOSIS — R197 Diarrhea, unspecified: Secondary | ICD-10-CM | POA: Diagnosis not present

## 2023-01-21 DIAGNOSIS — I4891 Unspecified atrial fibrillation: Secondary | ICD-10-CM | POA: Diagnosis not present

## 2023-01-21 DIAGNOSIS — R197 Diarrhea, unspecified: Secondary | ICD-10-CM | POA: Diagnosis not present

## 2023-01-21 DIAGNOSIS — R531 Weakness: Secondary | ICD-10-CM | POA: Diagnosis not present

## 2023-01-22 DIAGNOSIS — R197 Diarrhea, unspecified: Secondary | ICD-10-CM | POA: Diagnosis not present

## 2023-01-22 DIAGNOSIS — R531 Weakness: Secondary | ICD-10-CM | POA: Diagnosis not present

## 2023-01-22 DIAGNOSIS — I4891 Unspecified atrial fibrillation: Secondary | ICD-10-CM | POA: Diagnosis not present

## 2023-01-24 DIAGNOSIS — R5383 Other fatigue: Secondary | ICD-10-CM | POA: Diagnosis not present

## 2023-01-24 DIAGNOSIS — M2041 Other hammer toe(s) (acquired), right foot: Secondary | ICD-10-CM | POA: Diagnosis not present

## 2023-01-24 DIAGNOSIS — B372 Candidiasis of skin and nail: Secondary | ICD-10-CM | POA: Diagnosis not present

## 2023-01-24 DIAGNOSIS — I5022 Chronic systolic (congestive) heart failure: Secondary | ICD-10-CM | POA: Diagnosis not present

## 2023-01-24 DIAGNOSIS — I10 Essential (primary) hypertension: Secondary | ICD-10-CM | POA: Diagnosis not present

## 2023-01-24 DIAGNOSIS — M810 Age-related osteoporosis without current pathological fracture: Secondary | ICD-10-CM | POA: Diagnosis not present

## 2023-01-24 DIAGNOSIS — Z743 Need for continuous supervision: Secondary | ICD-10-CM | POA: Diagnosis not present

## 2023-01-24 DIAGNOSIS — R197 Diarrhea, unspecified: Secondary | ICD-10-CM | POA: Diagnosis not present

## 2023-01-24 DIAGNOSIS — I4891 Unspecified atrial fibrillation: Secondary | ICD-10-CM | POA: Diagnosis not present

## 2023-01-24 DIAGNOSIS — G9009 Other idiopathic peripheral autonomic neuropathy: Secondary | ICD-10-CM | POA: Diagnosis not present

## 2023-01-24 DIAGNOSIS — E875 Hyperkalemia: Secondary | ICD-10-CM | POA: Diagnosis not present

## 2023-01-24 DIAGNOSIS — M2010 Hallux valgus (acquired), unspecified foot: Secondary | ICD-10-CM | POA: Diagnosis not present

## 2023-01-24 DIAGNOSIS — N3281 Overactive bladder: Secondary | ICD-10-CM | POA: Diagnosis not present

## 2023-01-24 DIAGNOSIS — Z7401 Bed confinement status: Secondary | ICD-10-CM | POA: Diagnosis not present

## 2023-01-24 DIAGNOSIS — B351 Tinea unguium: Secondary | ICD-10-CM | POA: Diagnosis not present

## 2023-01-24 DIAGNOSIS — J811 Chronic pulmonary edema: Secondary | ICD-10-CM | POA: Diagnosis not present

## 2023-01-24 DIAGNOSIS — I5032 Chronic diastolic (congestive) heart failure: Secondary | ICD-10-CM | POA: Diagnosis not present

## 2023-01-24 DIAGNOSIS — I739 Peripheral vascular disease, unspecified: Secondary | ICD-10-CM | POA: Diagnosis not present

## 2023-01-24 DIAGNOSIS — R262 Difficulty in walking, not elsewhere classified: Secondary | ICD-10-CM | POA: Diagnosis not present

## 2023-01-24 DIAGNOSIS — R531 Weakness: Secondary | ICD-10-CM | POA: Diagnosis not present

## 2023-01-24 DIAGNOSIS — I480 Paroxysmal atrial fibrillation: Secondary | ICD-10-CM | POA: Diagnosis not present

## 2023-01-24 DIAGNOSIS — M818 Other osteoporosis without current pathological fracture: Secondary | ICD-10-CM | POA: Diagnosis not present

## 2023-01-24 DIAGNOSIS — M8000XD Age-related osteoporosis with current pathological fracture, unspecified site, subsequent encounter for fracture with routine healing: Secondary | ICD-10-CM | POA: Diagnosis not present

## 2023-01-24 DIAGNOSIS — M6281 Muscle weakness (generalized): Secondary | ICD-10-CM | POA: Diagnosis not present

## 2023-01-29 DIAGNOSIS — G9009 Other idiopathic peripheral autonomic neuropathy: Secondary | ICD-10-CM | POA: Diagnosis not present

## 2023-01-29 DIAGNOSIS — E875 Hyperkalemia: Secondary | ICD-10-CM | POA: Diagnosis not present

## 2023-01-29 DIAGNOSIS — M818 Other osteoporosis without current pathological fracture: Secondary | ICD-10-CM | POA: Diagnosis not present

## 2023-01-29 DIAGNOSIS — I5032 Chronic diastolic (congestive) heart failure: Secondary | ICD-10-CM | POA: Diagnosis not present

## 2023-01-29 DIAGNOSIS — I4891 Unspecified atrial fibrillation: Secondary | ICD-10-CM | POA: Diagnosis not present

## 2023-01-29 DIAGNOSIS — M6281 Muscle weakness (generalized): Secondary | ICD-10-CM | POA: Diagnosis not present

## 2023-01-29 DIAGNOSIS — N3281 Overactive bladder: Secondary | ICD-10-CM | POA: Diagnosis not present

## 2023-01-29 DIAGNOSIS — R5383 Other fatigue: Secondary | ICD-10-CM | POA: Diagnosis not present

## 2023-01-29 DIAGNOSIS — R197 Diarrhea, unspecified: Secondary | ICD-10-CM | POA: Diagnosis not present

## 2023-01-29 DIAGNOSIS — I10 Essential (primary) hypertension: Secondary | ICD-10-CM | POA: Diagnosis not present

## 2023-01-30 DIAGNOSIS — I4891 Unspecified atrial fibrillation: Secondary | ICD-10-CM | POA: Diagnosis not present

## 2023-01-30 DIAGNOSIS — I10 Essential (primary) hypertension: Secondary | ICD-10-CM | POA: Diagnosis not present

## 2023-01-30 DIAGNOSIS — M810 Age-related osteoporosis without current pathological fracture: Secondary | ICD-10-CM | POA: Diagnosis not present

## 2023-01-30 DIAGNOSIS — J811 Chronic pulmonary edema: Secondary | ICD-10-CM | POA: Diagnosis not present

## 2023-01-31 DIAGNOSIS — I5032 Chronic diastolic (congestive) heart failure: Secondary | ICD-10-CM | POA: Diagnosis not present

## 2023-01-31 DIAGNOSIS — M818 Other osteoporosis without current pathological fracture: Secondary | ICD-10-CM | POA: Diagnosis not present

## 2023-01-31 DIAGNOSIS — I4891 Unspecified atrial fibrillation: Secondary | ICD-10-CM | POA: Diagnosis not present

## 2023-01-31 DIAGNOSIS — R262 Difficulty in walking, not elsewhere classified: Secondary | ICD-10-CM | POA: Diagnosis not present

## 2023-02-02 DIAGNOSIS — I4891 Unspecified atrial fibrillation: Secondary | ICD-10-CM | POA: Diagnosis not present

## 2023-02-02 DIAGNOSIS — M6281 Muscle weakness (generalized): Secondary | ICD-10-CM | POA: Diagnosis not present

## 2023-02-02 DIAGNOSIS — I10 Essential (primary) hypertension: Secondary | ICD-10-CM | POA: Diagnosis not present

## 2023-02-02 DIAGNOSIS — M818 Other osteoporosis without current pathological fracture: Secondary | ICD-10-CM | POA: Diagnosis not present

## 2023-02-05 DIAGNOSIS — I5032 Chronic diastolic (congestive) heart failure: Secondary | ICD-10-CM | POA: Diagnosis not present

## 2023-02-05 DIAGNOSIS — I10 Essential (primary) hypertension: Secondary | ICD-10-CM | POA: Diagnosis not present

## 2023-02-05 DIAGNOSIS — M6281 Muscle weakness (generalized): Secondary | ICD-10-CM | POA: Diagnosis not present

## 2023-02-05 DIAGNOSIS — I4891 Unspecified atrial fibrillation: Secondary | ICD-10-CM | POA: Diagnosis not present

## 2023-02-07 DIAGNOSIS — B351 Tinea unguium: Secondary | ICD-10-CM | POA: Diagnosis not present

## 2023-02-07 DIAGNOSIS — M2010 Hallux valgus (acquired), unspecified foot: Secondary | ICD-10-CM | POA: Diagnosis not present

## 2023-02-07 DIAGNOSIS — I739 Peripheral vascular disease, unspecified: Secondary | ICD-10-CM | POA: Diagnosis not present

## 2023-02-07 DIAGNOSIS — I10 Essential (primary) hypertension: Secondary | ICD-10-CM | POA: Diagnosis not present

## 2023-02-07 DIAGNOSIS — M2041 Other hammer toe(s) (acquired), right foot: Secondary | ICD-10-CM | POA: Diagnosis not present

## 2023-02-07 DIAGNOSIS — I4891 Unspecified atrial fibrillation: Secondary | ICD-10-CM | POA: Diagnosis not present

## 2023-02-09 DIAGNOSIS — R262 Difficulty in walking, not elsewhere classified: Secondary | ICD-10-CM | POA: Diagnosis not present

## 2023-02-09 DIAGNOSIS — M6281 Muscle weakness (generalized): Secondary | ICD-10-CM | POA: Diagnosis not present

## 2023-02-09 DIAGNOSIS — M8000XD Age-related osteoporosis with current pathological fracture, unspecified site, subsequent encounter for fracture with routine healing: Secondary | ICD-10-CM | POA: Diagnosis not present

## 2023-02-09 DIAGNOSIS — I5022 Chronic systolic (congestive) heart failure: Secondary | ICD-10-CM | POA: Diagnosis not present

## 2023-02-09 DIAGNOSIS — G9009 Other idiopathic peripheral autonomic neuropathy: Secondary | ICD-10-CM | POA: Diagnosis not present

## 2023-02-09 DIAGNOSIS — N390 Urinary tract infection, site not specified: Secondary | ICD-10-CM | POA: Diagnosis not present

## 2023-02-09 DIAGNOSIS — I480 Paroxysmal atrial fibrillation: Secondary | ICD-10-CM | POA: Diagnosis not present

## 2023-02-10 DIAGNOSIS — M6281 Muscle weakness (generalized): Secondary | ICD-10-CM | POA: Diagnosis not present

## 2023-02-10 DIAGNOSIS — M8000XD Age-related osteoporosis with current pathological fracture, unspecified site, subsequent encounter for fracture with routine healing: Secondary | ICD-10-CM | POA: Diagnosis not present

## 2023-02-10 DIAGNOSIS — I5022 Chronic systolic (congestive) heart failure: Secondary | ICD-10-CM | POA: Diagnosis not present

## 2023-02-10 DIAGNOSIS — G9009 Other idiopathic peripheral autonomic neuropathy: Secondary | ICD-10-CM | POA: Diagnosis not present

## 2023-02-10 DIAGNOSIS — R262 Difficulty in walking, not elsewhere classified: Secondary | ICD-10-CM | POA: Diagnosis not present

## 2023-02-10 DIAGNOSIS — I480 Paroxysmal atrial fibrillation: Secondary | ICD-10-CM | POA: Diagnosis not present

## 2023-02-11 DIAGNOSIS — N390 Urinary tract infection, site not specified: Secondary | ICD-10-CM | POA: Diagnosis not present

## 2023-02-12 DIAGNOSIS — I5022 Chronic systolic (congestive) heart failure: Secondary | ICD-10-CM | POA: Diagnosis not present

## 2023-02-12 DIAGNOSIS — M8000XD Age-related osteoporosis with current pathological fracture, unspecified site, subsequent encounter for fracture with routine healing: Secondary | ICD-10-CM | POA: Diagnosis not present

## 2023-02-12 DIAGNOSIS — G9009 Other idiopathic peripheral autonomic neuropathy: Secondary | ICD-10-CM | POA: Diagnosis not present

## 2023-02-12 DIAGNOSIS — M6281 Muscle weakness (generalized): Secondary | ICD-10-CM | POA: Diagnosis not present

## 2023-02-12 DIAGNOSIS — I480 Paroxysmal atrial fibrillation: Secondary | ICD-10-CM | POA: Diagnosis not present

## 2023-02-12 DIAGNOSIS — R262 Difficulty in walking, not elsewhere classified: Secondary | ICD-10-CM | POA: Diagnosis not present

## 2023-02-13 DIAGNOSIS — I480 Paroxysmal atrial fibrillation: Secondary | ICD-10-CM | POA: Diagnosis not present

## 2023-02-13 DIAGNOSIS — R262 Difficulty in walking, not elsewhere classified: Secondary | ICD-10-CM | POA: Diagnosis not present

## 2023-02-13 DIAGNOSIS — M6281 Muscle weakness (generalized): Secondary | ICD-10-CM | POA: Diagnosis not present

## 2023-02-13 DIAGNOSIS — I5022 Chronic systolic (congestive) heart failure: Secondary | ICD-10-CM | POA: Diagnosis not present

## 2023-02-13 DIAGNOSIS — G9009 Other idiopathic peripheral autonomic neuropathy: Secondary | ICD-10-CM | POA: Diagnosis not present

## 2023-02-13 DIAGNOSIS — M8000XD Age-related osteoporosis with current pathological fracture, unspecified site, subsequent encounter for fracture with routine healing: Secondary | ICD-10-CM | POA: Diagnosis not present

## 2023-02-14 DIAGNOSIS — I480 Paroxysmal atrial fibrillation: Secondary | ICD-10-CM | POA: Diagnosis not present

## 2023-02-14 DIAGNOSIS — M8000XD Age-related osteoporosis with current pathological fracture, unspecified site, subsequent encounter for fracture with routine healing: Secondary | ICD-10-CM | POA: Diagnosis not present

## 2023-02-14 DIAGNOSIS — R262 Difficulty in walking, not elsewhere classified: Secondary | ICD-10-CM | POA: Diagnosis not present

## 2023-02-14 DIAGNOSIS — G9009 Other idiopathic peripheral autonomic neuropathy: Secondary | ICD-10-CM | POA: Diagnosis not present

## 2023-02-14 DIAGNOSIS — I5022 Chronic systolic (congestive) heart failure: Secondary | ICD-10-CM | POA: Diagnosis not present

## 2023-02-14 DIAGNOSIS — M6281 Muscle weakness (generalized): Secondary | ICD-10-CM | POA: Diagnosis not present

## 2023-02-26 DIAGNOSIS — H547 Unspecified visual loss: Secondary | ICD-10-CM | POA: Diagnosis not present

## 2023-02-26 DIAGNOSIS — I4891 Unspecified atrial fibrillation: Secondary | ICD-10-CM | POA: Diagnosis not present

## 2023-02-26 DIAGNOSIS — R2689 Other abnormalities of gait and mobility: Secondary | ICD-10-CM | POA: Diagnosis not present

## 2023-02-26 DIAGNOSIS — M6281 Muscle weakness (generalized): Secondary | ICD-10-CM | POA: Diagnosis not present

## 2023-02-26 DIAGNOSIS — R600 Localized edema: Secondary | ICD-10-CM | POA: Diagnosis not present

## 2023-02-28 DIAGNOSIS — G4733 Obstructive sleep apnea (adult) (pediatric): Secondary | ICD-10-CM | POA: Diagnosis not present

## 2023-03-08 DIAGNOSIS — M6281 Muscle weakness (generalized): Secondary | ICD-10-CM | POA: Diagnosis not present

## 2023-03-08 DIAGNOSIS — Z9181 History of falling: Secondary | ICD-10-CM | POA: Diagnosis not present

## 2023-03-08 DIAGNOSIS — R197 Diarrhea, unspecified: Secondary | ICD-10-CM | POA: Diagnosis not present

## 2023-03-08 DIAGNOSIS — R262 Difficulty in walking, not elsewhere classified: Secondary | ICD-10-CM | POA: Diagnosis not present

## 2023-03-09 DIAGNOSIS — Z08 Encounter for follow-up examination after completed treatment for malignant neoplasm: Secondary | ICD-10-CM | POA: Diagnosis not present

## 2023-03-09 DIAGNOSIS — Z85828 Personal history of other malignant neoplasm of skin: Secondary | ICD-10-CM | POA: Diagnosis not present

## 2023-03-09 DIAGNOSIS — D485 Neoplasm of uncertain behavior of skin: Secondary | ICD-10-CM | POA: Diagnosis not present

## 2023-03-09 DIAGNOSIS — L814 Other melanin hyperpigmentation: Secondary | ICD-10-CM | POA: Diagnosis not present

## 2023-03-09 DIAGNOSIS — L821 Other seborrheic keratosis: Secondary | ICD-10-CM | POA: Diagnosis not present

## 2023-03-14 DIAGNOSIS — M6281 Muscle weakness (generalized): Secondary | ICD-10-CM | POA: Diagnosis not present

## 2023-03-14 DIAGNOSIS — R262 Difficulty in walking, not elsewhere classified: Secondary | ICD-10-CM | POA: Diagnosis not present

## 2023-03-14 DIAGNOSIS — Z9181 History of falling: Secondary | ICD-10-CM | POA: Diagnosis not present

## 2023-03-16 DIAGNOSIS — R262 Difficulty in walking, not elsewhere classified: Secondary | ICD-10-CM | POA: Diagnosis not present

## 2023-03-16 DIAGNOSIS — M6281 Muscle weakness (generalized): Secondary | ICD-10-CM | POA: Diagnosis not present

## 2023-03-16 DIAGNOSIS — Z9181 History of falling: Secondary | ICD-10-CM | POA: Diagnosis not present

## 2023-03-18 DIAGNOSIS — G4733 Obstructive sleep apnea (adult) (pediatric): Secondary | ICD-10-CM | POA: Diagnosis not present

## 2023-03-20 DIAGNOSIS — I83813 Varicose veins of bilateral lower extremities with pain: Secondary | ICD-10-CM | POA: Diagnosis not present

## 2023-03-20 DIAGNOSIS — I83893 Varicose veins of bilateral lower extremities with other complications: Secondary | ICD-10-CM | POA: Diagnosis not present

## 2023-03-21 DIAGNOSIS — R262 Difficulty in walking, not elsewhere classified: Secondary | ICD-10-CM | POA: Diagnosis not present

## 2023-03-21 DIAGNOSIS — Z9181 History of falling: Secondary | ICD-10-CM | POA: Diagnosis not present

## 2023-03-21 DIAGNOSIS — M6281 Muscle weakness (generalized): Secondary | ICD-10-CM | POA: Diagnosis not present

## 2023-03-22 DIAGNOSIS — M79604 Pain in right leg: Secondary | ICD-10-CM | POA: Diagnosis not present

## 2023-03-22 DIAGNOSIS — M79605 Pain in left leg: Secondary | ICD-10-CM | POA: Diagnosis not present

## 2023-03-23 DIAGNOSIS — M6281 Muscle weakness (generalized): Secondary | ICD-10-CM | POA: Diagnosis not present

## 2023-03-23 DIAGNOSIS — R262 Difficulty in walking, not elsewhere classified: Secondary | ICD-10-CM | POA: Diagnosis not present

## 2023-03-23 DIAGNOSIS — Z9181 History of falling: Secondary | ICD-10-CM | POA: Diagnosis not present

## 2023-03-26 DIAGNOSIS — Z9181 History of falling: Secondary | ICD-10-CM | POA: Diagnosis not present

## 2023-03-26 DIAGNOSIS — M6281 Muscle weakness (generalized): Secondary | ICD-10-CM | POA: Diagnosis not present

## 2023-03-26 DIAGNOSIS — R262 Difficulty in walking, not elsewhere classified: Secondary | ICD-10-CM | POA: Diagnosis not present

## 2023-03-28 DIAGNOSIS — Z9181 History of falling: Secondary | ICD-10-CM | POA: Diagnosis not present

## 2023-03-28 DIAGNOSIS — M6281 Muscle weakness (generalized): Secondary | ICD-10-CM | POA: Diagnosis not present

## 2023-03-28 DIAGNOSIS — R262 Difficulty in walking, not elsewhere classified: Secondary | ICD-10-CM | POA: Diagnosis not present

## 2023-04-04 DIAGNOSIS — I4891 Unspecified atrial fibrillation: Secondary | ICD-10-CM | POA: Diagnosis not present

## 2023-04-04 DIAGNOSIS — M79609 Pain in unspecified limb: Secondary | ICD-10-CM | POA: Diagnosis not present

## 2023-04-04 DIAGNOSIS — R262 Difficulty in walking, not elsewhere classified: Secondary | ICD-10-CM | POA: Diagnosis not present

## 2023-04-04 DIAGNOSIS — Z8673 Personal history of transient ischemic attack (TIA), and cerebral infarction without residual deficits: Secondary | ICD-10-CM | POA: Diagnosis not present

## 2023-04-04 DIAGNOSIS — M6281 Muscle weakness (generalized): Secondary | ICD-10-CM | POA: Diagnosis not present

## 2023-04-04 DIAGNOSIS — Z9181 History of falling: Secondary | ICD-10-CM | POA: Diagnosis not present

## 2023-04-06 DIAGNOSIS — Z9181 History of falling: Secondary | ICD-10-CM | POA: Diagnosis not present

## 2023-04-06 DIAGNOSIS — M6281 Muscle weakness (generalized): Secondary | ICD-10-CM | POA: Diagnosis not present

## 2023-04-06 DIAGNOSIS — R262 Difficulty in walking, not elsewhere classified: Secondary | ICD-10-CM | POA: Diagnosis not present

## 2023-04-09 DIAGNOSIS — M6281 Muscle weakness (generalized): Secondary | ICD-10-CM | POA: Diagnosis not present

## 2023-04-09 DIAGNOSIS — Z9181 History of falling: Secondary | ICD-10-CM | POA: Diagnosis not present

## 2023-04-09 DIAGNOSIS — R262 Difficulty in walking, not elsewhere classified: Secondary | ICD-10-CM | POA: Diagnosis not present

## 2023-04-11 DIAGNOSIS — Z9181 History of falling: Secondary | ICD-10-CM | POA: Diagnosis not present

## 2023-04-11 DIAGNOSIS — R262 Difficulty in walking, not elsewhere classified: Secondary | ICD-10-CM | POA: Diagnosis not present

## 2023-04-11 DIAGNOSIS — M6281 Muscle weakness (generalized): Secondary | ICD-10-CM | POA: Diagnosis not present

## 2023-04-16 DIAGNOSIS — M6281 Muscle weakness (generalized): Secondary | ICD-10-CM | POA: Diagnosis not present

## 2023-04-16 DIAGNOSIS — Z9181 History of falling: Secondary | ICD-10-CM | POA: Diagnosis not present

## 2023-04-16 DIAGNOSIS — R262 Difficulty in walking, not elsewhere classified: Secondary | ICD-10-CM | POA: Diagnosis not present

## 2023-04-18 DIAGNOSIS — G4733 Obstructive sleep apnea (adult) (pediatric): Secondary | ICD-10-CM | POA: Diagnosis not present

## 2023-04-18 DIAGNOSIS — M6281 Muscle weakness (generalized): Secondary | ICD-10-CM | POA: Diagnosis not present

## 2023-04-18 DIAGNOSIS — Z9181 History of falling: Secondary | ICD-10-CM | POA: Diagnosis not present

## 2023-04-18 DIAGNOSIS — R262 Difficulty in walking, not elsewhere classified: Secondary | ICD-10-CM | POA: Diagnosis not present

## 2023-04-23 DIAGNOSIS — R262 Difficulty in walking, not elsewhere classified: Secondary | ICD-10-CM | POA: Diagnosis not present

## 2023-04-23 DIAGNOSIS — Z9181 History of falling: Secondary | ICD-10-CM | POA: Diagnosis not present

## 2023-04-23 DIAGNOSIS — M6281 Muscle weakness (generalized): Secondary | ICD-10-CM | POA: Diagnosis not present

## 2023-04-24 DIAGNOSIS — I83811 Varicose veins of right lower extremities with pain: Secondary | ICD-10-CM | POA: Diagnosis not present

## 2023-04-24 DIAGNOSIS — I8311 Varicose veins of right lower extremity with inflammation: Secondary | ICD-10-CM | POA: Diagnosis not present

## 2023-04-24 DIAGNOSIS — Z79899 Other long term (current) drug therapy: Secondary | ICD-10-CM | POA: Diagnosis not present

## 2023-04-24 DIAGNOSIS — I10 Essential (primary) hypertension: Secondary | ICD-10-CM | POA: Diagnosis not present

## 2023-04-24 DIAGNOSIS — M7989 Other specified soft tissue disorders: Secondary | ICD-10-CM | POA: Diagnosis not present

## 2023-04-24 DIAGNOSIS — I4891 Unspecified atrial fibrillation: Secondary | ICD-10-CM | POA: Diagnosis not present

## 2023-04-24 DIAGNOSIS — I83891 Varicose veins of right lower extremities with other complications: Secondary | ICD-10-CM | POA: Diagnosis not present

## 2023-04-25 DIAGNOSIS — R262 Difficulty in walking, not elsewhere classified: Secondary | ICD-10-CM | POA: Diagnosis not present

## 2023-04-25 DIAGNOSIS — Z9181 History of falling: Secondary | ICD-10-CM | POA: Diagnosis not present

## 2023-04-25 DIAGNOSIS — M6281 Muscle weakness (generalized): Secondary | ICD-10-CM | POA: Diagnosis not present

## 2023-04-27 DIAGNOSIS — I83811 Varicose veins of right lower extremities with pain: Secondary | ICD-10-CM | POA: Diagnosis not present

## 2023-04-27 DIAGNOSIS — I83891 Varicose veins of right lower extremities with other complications: Secondary | ICD-10-CM | POA: Diagnosis not present

## 2023-04-28 DIAGNOSIS — Z01818 Encounter for other preprocedural examination: Secondary | ICD-10-CM | POA: Diagnosis not present

## 2023-05-01 DIAGNOSIS — I83892 Varicose veins of left lower extremities with other complications: Secondary | ICD-10-CM | POA: Diagnosis not present

## 2023-05-01 DIAGNOSIS — I83812 Varicose veins of left lower extremities with pain: Secondary | ICD-10-CM | POA: Diagnosis not present

## 2023-05-02 DIAGNOSIS — Z9181 History of falling: Secondary | ICD-10-CM | POA: Diagnosis not present

## 2023-05-02 DIAGNOSIS — M6281 Muscle weakness (generalized): Secondary | ICD-10-CM | POA: Diagnosis not present

## 2023-05-02 DIAGNOSIS — R262 Difficulty in walking, not elsewhere classified: Secondary | ICD-10-CM | POA: Diagnosis not present

## 2023-05-03 DIAGNOSIS — I83811 Varicose veins of right lower extremities with pain: Secondary | ICD-10-CM | POA: Diagnosis not present

## 2023-05-03 DIAGNOSIS — I83891 Varicose veins of right lower extremities with other complications: Secondary | ICD-10-CM | POA: Diagnosis not present

## 2023-05-04 DIAGNOSIS — M6281 Muscle weakness (generalized): Secondary | ICD-10-CM | POA: Diagnosis not present

## 2023-05-04 DIAGNOSIS — Z01818 Encounter for other preprocedural examination: Secondary | ICD-10-CM | POA: Diagnosis not present

## 2023-05-04 DIAGNOSIS — Z9181 History of falling: Secondary | ICD-10-CM | POA: Diagnosis not present

## 2023-05-04 DIAGNOSIS — R262 Difficulty in walking, not elsewhere classified: Secondary | ICD-10-CM | POA: Diagnosis not present

## 2023-05-07 DIAGNOSIS — Z9181 History of falling: Secondary | ICD-10-CM | POA: Diagnosis not present

## 2023-05-07 DIAGNOSIS — M6281 Muscle weakness (generalized): Secondary | ICD-10-CM | POA: Diagnosis not present

## 2023-05-07 DIAGNOSIS — R262 Difficulty in walking, not elsewhere classified: Secondary | ICD-10-CM | POA: Diagnosis not present

## 2023-05-08 DIAGNOSIS — I83891 Varicose veins of right lower extremities with other complications: Secondary | ICD-10-CM | POA: Diagnosis not present

## 2023-05-08 DIAGNOSIS — I83813 Varicose veins of bilateral lower extremities with pain: Secondary | ICD-10-CM | POA: Diagnosis not present

## 2023-05-08 DIAGNOSIS — I83811 Varicose veins of right lower extremities with pain: Secondary | ICD-10-CM | POA: Diagnosis not present

## 2023-05-09 DIAGNOSIS — I82511 Chronic embolism and thrombosis of right femoral vein: Secondary | ICD-10-CM | POA: Diagnosis not present

## 2023-05-10 DIAGNOSIS — I83811 Varicose veins of right lower extremities with pain: Secondary | ICD-10-CM | POA: Diagnosis not present

## 2023-05-10 DIAGNOSIS — R262 Difficulty in walking, not elsewhere classified: Secondary | ICD-10-CM | POA: Diagnosis not present

## 2023-05-10 DIAGNOSIS — Z01818 Encounter for other preprocedural examination: Secondary | ICD-10-CM | POA: Diagnosis not present

## 2023-05-10 DIAGNOSIS — I83891 Varicose veins of right lower extremities with other complications: Secondary | ICD-10-CM | POA: Diagnosis not present

## 2023-05-10 DIAGNOSIS — M6281 Muscle weakness (generalized): Secondary | ICD-10-CM | POA: Diagnosis not present

## 2023-05-10 DIAGNOSIS — Z9181 History of falling: Secondary | ICD-10-CM | POA: Diagnosis not present

## 2023-05-14 DIAGNOSIS — Z9181 History of falling: Secondary | ICD-10-CM | POA: Diagnosis not present

## 2023-05-14 DIAGNOSIS — R262 Difficulty in walking, not elsewhere classified: Secondary | ICD-10-CM | POA: Diagnosis not present

## 2023-05-14 DIAGNOSIS — M6281 Muscle weakness (generalized): Secondary | ICD-10-CM | POA: Diagnosis not present

## 2023-05-18 DIAGNOSIS — R262 Difficulty in walking, not elsewhere classified: Secondary | ICD-10-CM | POA: Diagnosis not present

## 2023-05-18 DIAGNOSIS — G4733 Obstructive sleep apnea (adult) (pediatric): Secondary | ICD-10-CM | POA: Diagnosis not present

## 2023-05-18 DIAGNOSIS — M6281 Muscle weakness (generalized): Secondary | ICD-10-CM | POA: Diagnosis not present

## 2023-05-18 DIAGNOSIS — Z9181 History of falling: Secondary | ICD-10-CM | POA: Diagnosis not present

## 2023-05-21 DIAGNOSIS — Z9181 History of falling: Secondary | ICD-10-CM | POA: Diagnosis not present

## 2023-05-21 DIAGNOSIS — M6281 Muscle weakness (generalized): Secondary | ICD-10-CM | POA: Diagnosis not present

## 2023-05-21 DIAGNOSIS — R262 Difficulty in walking, not elsewhere classified: Secondary | ICD-10-CM | POA: Diagnosis not present

## 2023-05-22 DIAGNOSIS — I83892 Varicose veins of left lower extremities with other complications: Secondary | ICD-10-CM | POA: Diagnosis not present

## 2023-05-22 DIAGNOSIS — I83812 Varicose veins of left lower extremities with pain: Secondary | ICD-10-CM | POA: Diagnosis not present

## 2023-05-22 DIAGNOSIS — I83893 Varicose veins of bilateral lower extremities with other complications: Secondary | ICD-10-CM | POA: Diagnosis not present

## 2023-05-23 DIAGNOSIS — R262 Difficulty in walking, not elsewhere classified: Secondary | ICD-10-CM | POA: Diagnosis not present

## 2023-05-23 DIAGNOSIS — M6281 Muscle weakness (generalized): Secondary | ICD-10-CM | POA: Diagnosis not present

## 2023-05-23 DIAGNOSIS — Z9181 History of falling: Secondary | ICD-10-CM | POA: Diagnosis not present

## 2023-05-24 DIAGNOSIS — I83892 Varicose veins of left lower extremities with other complications: Secondary | ICD-10-CM | POA: Diagnosis not present

## 2023-05-24 DIAGNOSIS — I82401 Acute embolism and thrombosis of unspecified deep veins of right lower extremity: Secondary | ICD-10-CM | POA: Diagnosis not present

## 2023-05-24 DIAGNOSIS — I83812 Varicose veins of left lower extremities with pain: Secondary | ICD-10-CM | POA: Diagnosis not present

## 2023-05-25 DIAGNOSIS — Z01818 Encounter for other preprocedural examination: Secondary | ICD-10-CM | POA: Diagnosis not present

## 2023-05-28 DIAGNOSIS — R262 Difficulty in walking, not elsewhere classified: Secondary | ICD-10-CM | POA: Diagnosis not present

## 2023-05-28 DIAGNOSIS — Z9181 History of falling: Secondary | ICD-10-CM | POA: Diagnosis not present

## 2023-05-28 DIAGNOSIS — M6281 Muscle weakness (generalized): Secondary | ICD-10-CM | POA: Diagnosis not present

## 2023-05-30 DIAGNOSIS — H518 Other specified disorders of binocular movement: Secondary | ICD-10-CM | POA: Diagnosis not present

## 2023-05-30 DIAGNOSIS — H52223 Regular astigmatism, bilateral: Secondary | ICD-10-CM | POA: Diagnosis not present

## 2023-05-30 DIAGNOSIS — H5509 Other forms of nystagmus: Secondary | ICD-10-CM | POA: Diagnosis not present

## 2023-05-30 DIAGNOSIS — H532 Diplopia: Secondary | ICD-10-CM | POA: Diagnosis not present

## 2023-05-30 DIAGNOSIS — H5340 Unspecified visual field defects: Secondary | ICD-10-CM | POA: Diagnosis not present

## 2023-05-30 DIAGNOSIS — H5005 Alternating esotropia: Secondary | ICD-10-CM | POA: Diagnosis not present

## 2023-05-31 DIAGNOSIS — R262 Difficulty in walking, not elsewhere classified: Secondary | ICD-10-CM | POA: Diagnosis not present

## 2023-05-31 DIAGNOSIS — Z9181 History of falling: Secondary | ICD-10-CM | POA: Diagnosis not present

## 2023-05-31 DIAGNOSIS — G4733 Obstructive sleep apnea (adult) (pediatric): Secondary | ICD-10-CM | POA: Diagnosis not present

## 2023-05-31 DIAGNOSIS — M6281 Muscle weakness (generalized): Secondary | ICD-10-CM | POA: Diagnosis not present

## 2023-06-04 DIAGNOSIS — Z9181 History of falling: Secondary | ICD-10-CM | POA: Diagnosis not present

## 2023-06-04 DIAGNOSIS — W1830XA Fall on same level, unspecified, initial encounter: Secondary | ICD-10-CM | POA: Diagnosis not present

## 2023-06-04 DIAGNOSIS — M6281 Muscle weakness (generalized): Secondary | ICD-10-CM | POA: Diagnosis not present

## 2023-06-04 DIAGNOSIS — Z79899 Other long term (current) drug therapy: Secondary | ICD-10-CM | POA: Diagnosis not present

## 2023-06-04 DIAGNOSIS — S098XXA Other specified injuries of head, initial encounter: Secondary | ICD-10-CM | POA: Diagnosis not present

## 2023-06-04 DIAGNOSIS — M542 Cervicalgia: Secondary | ICD-10-CM | POA: Diagnosis not present

## 2023-06-04 DIAGNOSIS — R262 Difficulty in walking, not elsewhere classified: Secondary | ICD-10-CM | POA: Diagnosis not present

## 2023-06-04 DIAGNOSIS — S0181XA Laceration without foreign body of other part of head, initial encounter: Secondary | ICD-10-CM | POA: Diagnosis not present

## 2023-06-04 DIAGNOSIS — S0990XA Unspecified injury of head, initial encounter: Secondary | ICD-10-CM | POA: Diagnosis not present

## 2023-06-05 DIAGNOSIS — M542 Cervicalgia: Secondary | ICD-10-CM | POA: Diagnosis not present

## 2023-06-05 DIAGNOSIS — S0990XA Unspecified injury of head, initial encounter: Secondary | ICD-10-CM | POA: Diagnosis not present

## 2023-06-05 DIAGNOSIS — G4733 Obstructive sleep apnea (adult) (pediatric): Secondary | ICD-10-CM | POA: Diagnosis not present

## 2023-06-08 DIAGNOSIS — S0080XD Unspecified superficial injury of other part of head, subsequent encounter: Secondary | ICD-10-CM | POA: Diagnosis not present

## 2023-06-08 DIAGNOSIS — R296 Repeated falls: Secondary | ICD-10-CM | POA: Diagnosis not present

## 2023-06-08 DIAGNOSIS — I4891 Unspecified atrial fibrillation: Secondary | ICD-10-CM | POA: Diagnosis not present

## 2023-06-08 DIAGNOSIS — S0990XD Unspecified injury of head, subsequent encounter: Secondary | ICD-10-CM | POA: Diagnosis not present

## 2023-06-11 DIAGNOSIS — S0990XD Unspecified injury of head, subsequent encounter: Secondary | ICD-10-CM | POA: Diagnosis not present

## 2023-06-11 DIAGNOSIS — R296 Repeated falls: Secondary | ICD-10-CM | POA: Diagnosis not present

## 2023-06-11 DIAGNOSIS — Z9889 Other specified postprocedural states: Secondary | ICD-10-CM | POA: Diagnosis not present

## 2023-06-11 DIAGNOSIS — Z01818 Encounter for other preprocedural examination: Secondary | ICD-10-CM | POA: Diagnosis not present

## 2023-06-11 DIAGNOSIS — I83813 Varicose veins of bilateral lower extremities with pain: Secondary | ICD-10-CM | POA: Diagnosis not present

## 2023-06-11 DIAGNOSIS — I4891 Unspecified atrial fibrillation: Secondary | ICD-10-CM | POA: Diagnosis not present

## 2023-06-11 DIAGNOSIS — S0080XD Unspecified superficial injury of other part of head, subsequent encounter: Secondary | ICD-10-CM | POA: Diagnosis not present

## 2023-06-11 DIAGNOSIS — I83893 Varicose veins of bilateral lower extremities with other complications: Secondary | ICD-10-CM | POA: Diagnosis not present

## 2023-06-11 DIAGNOSIS — R42 Dizziness and giddiness: Secondary | ICD-10-CM | POA: Diagnosis not present

## 2023-06-11 DIAGNOSIS — M7989 Other specified soft tissue disorders: Secondary | ICD-10-CM | POA: Diagnosis not present

## 2023-06-12 DIAGNOSIS — M6281 Muscle weakness (generalized): Secondary | ICD-10-CM | POA: Diagnosis not present

## 2023-06-12 DIAGNOSIS — Z9181 History of falling: Secondary | ICD-10-CM | POA: Diagnosis not present

## 2023-06-12 DIAGNOSIS — R42 Dizziness and giddiness: Secondary | ICD-10-CM | POA: Diagnosis not present

## 2023-06-12 DIAGNOSIS — R262 Difficulty in walking, not elsewhere classified: Secondary | ICD-10-CM | POA: Diagnosis not present

## 2023-06-14 DIAGNOSIS — Z9181 History of falling: Secondary | ICD-10-CM | POA: Diagnosis not present

## 2023-06-14 DIAGNOSIS — R42 Dizziness and giddiness: Secondary | ICD-10-CM | POA: Diagnosis not present

## 2023-06-14 DIAGNOSIS — R262 Difficulty in walking, not elsewhere classified: Secondary | ICD-10-CM | POA: Diagnosis not present

## 2023-06-14 DIAGNOSIS — M6281 Muscle weakness (generalized): Secondary | ICD-10-CM | POA: Diagnosis not present

## 2023-06-18 DIAGNOSIS — R42 Dizziness and giddiness: Secondary | ICD-10-CM | POA: Diagnosis not present

## 2023-06-18 DIAGNOSIS — Z9181 History of falling: Secondary | ICD-10-CM | POA: Diagnosis not present

## 2023-06-18 DIAGNOSIS — R262 Difficulty in walking, not elsewhere classified: Secondary | ICD-10-CM | POA: Diagnosis not present

## 2023-06-18 DIAGNOSIS — G4733 Obstructive sleep apnea (adult) (pediatric): Secondary | ICD-10-CM | POA: Diagnosis not present

## 2023-06-18 DIAGNOSIS — M6281 Muscle weakness (generalized): Secondary | ICD-10-CM | POA: Diagnosis not present

## 2023-06-21 DIAGNOSIS — R262 Difficulty in walking, not elsewhere classified: Secondary | ICD-10-CM | POA: Diagnosis not present

## 2023-06-21 DIAGNOSIS — R42 Dizziness and giddiness: Secondary | ICD-10-CM | POA: Diagnosis not present

## 2023-06-21 DIAGNOSIS — Z9181 History of falling: Secondary | ICD-10-CM | POA: Diagnosis not present

## 2023-06-21 DIAGNOSIS — M6281 Muscle weakness (generalized): Secondary | ICD-10-CM | POA: Diagnosis not present

## 2023-06-26 DIAGNOSIS — Z9181 History of falling: Secondary | ICD-10-CM | POA: Diagnosis not present

## 2023-06-26 DIAGNOSIS — M6281 Muscle weakness (generalized): Secondary | ICD-10-CM | POA: Diagnosis not present

## 2023-06-26 DIAGNOSIS — R42 Dizziness and giddiness: Secondary | ICD-10-CM | POA: Diagnosis not present

## 2023-06-26 DIAGNOSIS — R262 Difficulty in walking, not elsewhere classified: Secondary | ICD-10-CM | POA: Diagnosis not present

## 2023-06-28 DIAGNOSIS — Z9181 History of falling: Secondary | ICD-10-CM | POA: Diagnosis not present

## 2023-06-28 DIAGNOSIS — M6281 Muscle weakness (generalized): Secondary | ICD-10-CM | POA: Diagnosis not present

## 2023-06-28 DIAGNOSIS — R262 Difficulty in walking, not elsewhere classified: Secondary | ICD-10-CM | POA: Diagnosis not present

## 2023-06-28 DIAGNOSIS — R42 Dizziness and giddiness: Secondary | ICD-10-CM | POA: Diagnosis not present

## 2023-07-02 DIAGNOSIS — M6281 Muscle weakness (generalized): Secondary | ICD-10-CM | POA: Diagnosis not present

## 2023-07-02 DIAGNOSIS — R262 Difficulty in walking, not elsewhere classified: Secondary | ICD-10-CM | POA: Diagnosis not present

## 2023-07-02 DIAGNOSIS — R42 Dizziness and giddiness: Secondary | ICD-10-CM | POA: Diagnosis not present

## 2023-07-02 DIAGNOSIS — Z9181 History of falling: Secondary | ICD-10-CM | POA: Diagnosis not present

## 2023-07-04 DIAGNOSIS — R42 Dizziness and giddiness: Secondary | ICD-10-CM | POA: Diagnosis not present

## 2023-07-04 DIAGNOSIS — R262 Difficulty in walking, not elsewhere classified: Secondary | ICD-10-CM | POA: Diagnosis not present

## 2023-07-04 DIAGNOSIS — Z9181 History of falling: Secondary | ICD-10-CM | POA: Diagnosis not present

## 2023-07-04 DIAGNOSIS — M6281 Muscle weakness (generalized): Secondary | ICD-10-CM | POA: Diagnosis not present

## 2023-07-06 DIAGNOSIS — R0902 Hypoxemia: Secondary | ICD-10-CM | POA: Diagnosis not present

## 2023-07-06 DIAGNOSIS — G9341 Metabolic encephalopathy: Secondary | ICD-10-CM | POA: Diagnosis not present

## 2023-07-06 DIAGNOSIS — R64 Cachexia: Secondary | ICD-10-CM | POA: Diagnosis not present

## 2023-07-06 DIAGNOSIS — R296 Repeated falls: Secondary | ICD-10-CM | POA: Diagnosis not present

## 2023-07-06 DIAGNOSIS — R160 Hepatomegaly, not elsewhere classified: Secondary | ICD-10-CM | POA: Diagnosis not present

## 2023-07-06 DIAGNOSIS — I4891 Unspecified atrial fibrillation: Secondary | ICD-10-CM | POA: Diagnosis not present

## 2023-07-06 DIAGNOSIS — I69951 Hemiplegia and hemiparesis following unspecified cerebrovascular disease affecting right dominant side: Secondary | ICD-10-CM | POA: Diagnosis not present

## 2023-07-06 DIAGNOSIS — Z741 Need for assistance with personal care: Secondary | ICD-10-CM | POA: Diagnosis not present

## 2023-07-06 DIAGNOSIS — U071 COVID-19: Secondary | ICD-10-CM | POA: Diagnosis not present

## 2023-07-06 DIAGNOSIS — R109 Unspecified abdominal pain: Secondary | ICD-10-CM | POA: Diagnosis not present

## 2023-07-06 DIAGNOSIS — M7989 Other specified soft tissue disorders: Secondary | ICD-10-CM | POA: Diagnosis not present

## 2023-07-06 DIAGNOSIS — J1282 Pneumonia due to coronavirus disease 2019: Secondary | ICD-10-CM | POA: Diagnosis not present

## 2023-07-06 DIAGNOSIS — Z743 Need for continuous supervision: Secondary | ICD-10-CM | POA: Diagnosis not present

## 2023-07-06 DIAGNOSIS — Z7901 Long term (current) use of anticoagulants: Secondary | ICD-10-CM | POA: Diagnosis not present

## 2023-07-06 DIAGNOSIS — M545 Low back pain, unspecified: Secondary | ICD-10-CM | POA: Diagnosis not present

## 2023-07-06 DIAGNOSIS — R531 Weakness: Secondary | ICD-10-CM | POA: Diagnosis not present

## 2023-07-06 DIAGNOSIS — G8929 Other chronic pain: Secondary | ICD-10-CM | POA: Diagnosis not present

## 2023-07-06 DIAGNOSIS — A419 Sepsis, unspecified organism: Secondary | ICD-10-CM | POA: Diagnosis not present

## 2023-07-06 DIAGNOSIS — Z7409 Other reduced mobility: Secondary | ICD-10-CM | POA: Diagnosis not present

## 2023-07-06 DIAGNOSIS — M6281 Muscle weakness (generalized): Secondary | ICD-10-CM | POA: Diagnosis not present

## 2023-07-06 DIAGNOSIS — R279 Unspecified lack of coordination: Secondary | ICD-10-CM | POA: Diagnosis not present

## 2023-07-06 DIAGNOSIS — R059 Cough, unspecified: Secondary | ICD-10-CM | POA: Diagnosis not present

## 2023-07-08 DIAGNOSIS — U071 COVID-19: Secondary | ICD-10-CM | POA: Diagnosis not present

## 2023-07-08 DIAGNOSIS — R0902 Hypoxemia: Secondary | ICD-10-CM | POA: Diagnosis not present

## 2023-07-08 DIAGNOSIS — R160 Hepatomegaly, not elsewhere classified: Secondary | ICD-10-CM | POA: Diagnosis not present

## 2023-07-08 DIAGNOSIS — A419 Sepsis, unspecified organism: Secondary | ICD-10-CM | POA: Diagnosis not present

## 2023-07-08 DIAGNOSIS — R531 Weakness: Secondary | ICD-10-CM | POA: Diagnosis not present

## 2023-07-08 DIAGNOSIS — R109 Unspecified abdominal pain: Secondary | ICD-10-CM | POA: Diagnosis not present

## 2023-07-09 DIAGNOSIS — G9341 Metabolic encephalopathy: Secondary | ICD-10-CM | POA: Diagnosis not present

## 2023-07-10 DIAGNOSIS — G9341 Metabolic encephalopathy: Secondary | ICD-10-CM | POA: Diagnosis not present

## 2023-07-11 DIAGNOSIS — G9341 Metabolic encephalopathy: Secondary | ICD-10-CM | POA: Diagnosis not present

## 2023-07-12 DIAGNOSIS — G9341 Metabolic encephalopathy: Secondary | ICD-10-CM | POA: Diagnosis not present

## 2023-07-13 DIAGNOSIS — G9341 Metabolic encephalopathy: Secondary | ICD-10-CM | POA: Diagnosis not present

## 2023-07-14 DIAGNOSIS — G9341 Metabolic encephalopathy: Secondary | ICD-10-CM | POA: Diagnosis not present

## 2023-07-15 DIAGNOSIS — G9341 Metabolic encephalopathy: Secondary | ICD-10-CM | POA: Diagnosis not present

## 2023-07-16 DIAGNOSIS — R52 Pain, unspecified: Secondary | ICD-10-CM | POA: Diagnosis not present

## 2023-07-16 DIAGNOSIS — Z7409 Other reduced mobility: Secondary | ICD-10-CM | POA: Diagnosis not present

## 2023-07-16 DIAGNOSIS — R5381 Other malaise: Secondary | ICD-10-CM | POA: Diagnosis not present

## 2023-07-16 DIAGNOSIS — I509 Heart failure, unspecified: Secondary | ICD-10-CM | POA: Diagnosis not present

## 2023-07-16 DIAGNOSIS — J1282 Pneumonia due to coronavirus disease 2019: Secondary | ICD-10-CM | POA: Diagnosis not present

## 2023-07-16 DIAGNOSIS — R161 Splenomegaly, not elsewhere classified: Secondary | ICD-10-CM | POA: Diagnosis not present

## 2023-07-16 DIAGNOSIS — R300 Dysuria: Secondary | ICD-10-CM | POA: Diagnosis not present

## 2023-07-16 DIAGNOSIS — L988 Other specified disorders of the skin and subcutaneous tissue: Secondary | ICD-10-CM | POA: Diagnosis not present

## 2023-07-16 DIAGNOSIS — R296 Repeated falls: Secondary | ICD-10-CM | POA: Diagnosis not present

## 2023-07-16 DIAGNOSIS — M6281 Muscle weakness (generalized): Secondary | ICD-10-CM | POA: Diagnosis not present

## 2023-07-16 DIAGNOSIS — M545 Low back pain, unspecified: Secondary | ICD-10-CM | POA: Diagnosis not present

## 2023-07-16 DIAGNOSIS — R531 Weakness: Secondary | ICD-10-CM | POA: Diagnosis not present

## 2023-07-16 DIAGNOSIS — U071 COVID-19: Secondary | ICD-10-CM | POA: Diagnosis not present

## 2023-07-16 DIAGNOSIS — R279 Unspecified lack of coordination: Secondary | ICD-10-CM | POA: Diagnosis not present

## 2023-07-16 DIAGNOSIS — R059 Cough, unspecified: Secondary | ICD-10-CM | POA: Diagnosis not present

## 2023-07-16 DIAGNOSIS — I4891 Unspecified atrial fibrillation: Secondary | ICD-10-CM | POA: Diagnosis not present

## 2023-07-16 DIAGNOSIS — Z741 Need for assistance with personal care: Secondary | ICD-10-CM | POA: Diagnosis not present

## 2023-07-16 DIAGNOSIS — G9341 Metabolic encephalopathy: Secondary | ICD-10-CM | POA: Diagnosis not present

## 2023-07-16 DIAGNOSIS — Z743 Need for continuous supervision: Secondary | ICD-10-CM | POA: Diagnosis not present

## 2023-07-17 DIAGNOSIS — I509 Heart failure, unspecified: Secondary | ICD-10-CM | POA: Diagnosis not present

## 2023-07-17 DIAGNOSIS — R5381 Other malaise: Secondary | ICD-10-CM | POA: Diagnosis not present

## 2023-07-17 DIAGNOSIS — U071 COVID-19: Secondary | ICD-10-CM | POA: Diagnosis not present

## 2023-07-17 DIAGNOSIS — J1282 Pneumonia due to coronavirus disease 2019: Secondary | ICD-10-CM | POA: Diagnosis not present

## 2023-07-17 DIAGNOSIS — R161 Splenomegaly, not elsewhere classified: Secondary | ICD-10-CM | POA: Diagnosis not present

## 2023-07-17 DIAGNOSIS — I4891 Unspecified atrial fibrillation: Secondary | ICD-10-CM | POA: Diagnosis not present

## 2023-07-23 DIAGNOSIS — R5381 Other malaise: Secondary | ICD-10-CM | POA: Diagnosis not present

## 2023-07-23 DIAGNOSIS — I509 Heart failure, unspecified: Secondary | ICD-10-CM | POA: Diagnosis not present

## 2023-07-26 DIAGNOSIS — R5381 Other malaise: Secondary | ICD-10-CM | POA: Diagnosis not present

## 2023-07-26 DIAGNOSIS — I4891 Unspecified atrial fibrillation: Secondary | ICD-10-CM | POA: Diagnosis not present

## 2023-07-26 DIAGNOSIS — I509 Heart failure, unspecified: Secondary | ICD-10-CM | POA: Diagnosis not present

## 2023-07-26 DIAGNOSIS — U071 COVID-19: Secondary | ICD-10-CM | POA: Diagnosis not present

## 2023-07-26 DIAGNOSIS — R161 Splenomegaly, not elsewhere classified: Secondary | ICD-10-CM | POA: Diagnosis not present

## 2023-07-26 DIAGNOSIS — J1282 Pneumonia due to coronavirus disease 2019: Secondary | ICD-10-CM | POA: Diagnosis not present

## 2023-07-31 DIAGNOSIS — R5381 Other malaise: Secondary | ICD-10-CM | POA: Diagnosis not present

## 2023-07-31 DIAGNOSIS — R52 Pain, unspecified: Secondary | ICD-10-CM | POA: Diagnosis not present

## 2023-08-06 DIAGNOSIS — L988 Other specified disorders of the skin and subcutaneous tissue: Secondary | ICD-10-CM | POA: Diagnosis not present

## 2023-08-07 DIAGNOSIS — I509 Heart failure, unspecified: Secondary | ICD-10-CM | POA: Diagnosis not present

## 2023-08-07 DIAGNOSIS — R5381 Other malaise: Secondary | ICD-10-CM | POA: Diagnosis not present

## 2023-08-07 DIAGNOSIS — R300 Dysuria: Secondary | ICD-10-CM | POA: Diagnosis not present

## 2023-08-13 DIAGNOSIS — I509 Heart failure, unspecified: Secondary | ICD-10-CM | POA: Diagnosis not present

## 2023-08-13 DIAGNOSIS — R5381 Other malaise: Secondary | ICD-10-CM | POA: Diagnosis not present

## 2023-08-16 DIAGNOSIS — G9341 Metabolic encephalopathy: Secondary | ICD-10-CM | POA: Diagnosis not present

## 2023-08-16 DIAGNOSIS — J1282 Pneumonia due to coronavirus disease 2019: Secondary | ICD-10-CM | POA: Diagnosis not present

## 2023-08-16 DIAGNOSIS — R296 Repeated falls: Secondary | ICD-10-CM | POA: Diagnosis not present

## 2023-08-18 DIAGNOSIS — A419 Sepsis, unspecified organism: Secondary | ICD-10-CM | POA: Diagnosis not present

## 2023-08-18 DIAGNOSIS — U071 COVID-19: Secondary | ICD-10-CM | POA: Diagnosis not present

## 2023-08-18 DIAGNOSIS — I4891 Unspecified atrial fibrillation: Secondary | ICD-10-CM | POA: Diagnosis not present

## 2023-08-18 DIAGNOSIS — I82409 Acute embolism and thrombosis of unspecified deep veins of unspecified lower extremity: Secondary | ICD-10-CM | POA: Diagnosis not present

## 2023-08-18 DIAGNOSIS — I509 Heart failure, unspecified: Secondary | ICD-10-CM | POA: Diagnosis not present

## 2023-08-18 DIAGNOSIS — I110 Hypertensive heart disease with heart failure: Secondary | ICD-10-CM | POA: Diagnosis not present

## 2023-08-20 DIAGNOSIS — R296 Repeated falls: Secondary | ICD-10-CM | POA: Diagnosis not present

## 2023-08-20 DIAGNOSIS — G9341 Metabolic encephalopathy: Secondary | ICD-10-CM | POA: Diagnosis not present

## 2023-08-20 DIAGNOSIS — J1282 Pneumonia due to coronavirus disease 2019: Secondary | ICD-10-CM | POA: Diagnosis not present

## 2023-08-21 DIAGNOSIS — I110 Hypertensive heart disease with heart failure: Secondary | ICD-10-CM | POA: Diagnosis not present

## 2023-08-21 DIAGNOSIS — I82409 Acute embolism and thrombosis of unspecified deep veins of unspecified lower extremity: Secondary | ICD-10-CM | POA: Diagnosis not present

## 2023-08-21 DIAGNOSIS — I509 Heart failure, unspecified: Secondary | ICD-10-CM | POA: Diagnosis not present

## 2023-08-21 DIAGNOSIS — A419 Sepsis, unspecified organism: Secondary | ICD-10-CM | POA: Diagnosis not present

## 2023-08-21 DIAGNOSIS — U071 COVID-19: Secondary | ICD-10-CM | POA: Diagnosis not present

## 2023-08-21 DIAGNOSIS — I4891 Unspecified atrial fibrillation: Secondary | ICD-10-CM | POA: Diagnosis not present

## 2023-08-22 DIAGNOSIS — I509 Heart failure, unspecified: Secondary | ICD-10-CM | POA: Diagnosis not present

## 2023-08-22 DIAGNOSIS — I82409 Acute embolism and thrombosis of unspecified deep veins of unspecified lower extremity: Secondary | ICD-10-CM | POA: Diagnosis not present

## 2023-08-22 DIAGNOSIS — I110 Hypertensive heart disease with heart failure: Secondary | ICD-10-CM | POA: Diagnosis not present

## 2023-08-22 DIAGNOSIS — A419 Sepsis, unspecified organism: Secondary | ICD-10-CM | POA: Diagnosis not present

## 2023-08-22 DIAGNOSIS — U071 COVID-19: Secondary | ICD-10-CM | POA: Diagnosis not present

## 2023-08-22 DIAGNOSIS — I4891 Unspecified atrial fibrillation: Secondary | ICD-10-CM | POA: Diagnosis not present

## 2023-08-23 DIAGNOSIS — U071 COVID-19: Secondary | ICD-10-CM | POA: Diagnosis not present

## 2023-08-23 DIAGNOSIS — I4891 Unspecified atrial fibrillation: Secondary | ICD-10-CM | POA: Diagnosis not present

## 2023-08-23 DIAGNOSIS — A419 Sepsis, unspecified organism: Secondary | ICD-10-CM | POA: Diagnosis not present

## 2023-08-23 DIAGNOSIS — I82409 Acute embolism and thrombosis of unspecified deep veins of unspecified lower extremity: Secondary | ICD-10-CM | POA: Diagnosis not present

## 2023-08-23 DIAGNOSIS — I110 Hypertensive heart disease with heart failure: Secondary | ICD-10-CM | POA: Diagnosis not present

## 2023-08-23 DIAGNOSIS — I509 Heart failure, unspecified: Secondary | ICD-10-CM | POA: Diagnosis not present

## 2023-08-24 DIAGNOSIS — C4491 Basal cell carcinoma of skin, unspecified: Secondary | ICD-10-CM | POA: Diagnosis not present

## 2023-08-24 DIAGNOSIS — I10 Essential (primary) hypertension: Secondary | ICD-10-CM | POA: Diagnosis not present

## 2023-08-24 DIAGNOSIS — I509 Heart failure, unspecified: Secondary | ICD-10-CM | POA: Diagnosis not present

## 2023-08-24 DIAGNOSIS — J1282 Pneumonia due to coronavirus disease 2019: Secondary | ICD-10-CM | POA: Diagnosis not present

## 2023-08-24 DIAGNOSIS — S2232XD Fracture of one rib, left side, subsequent encounter for fracture with routine healing: Secondary | ICD-10-CM | POA: Diagnosis not present

## 2023-08-24 DIAGNOSIS — D734 Cyst of spleen: Secondary | ICD-10-CM | POA: Diagnosis not present

## 2023-08-24 DIAGNOSIS — M810 Age-related osteoporosis without current pathological fracture: Secondary | ICD-10-CM | POA: Diagnosis not present

## 2023-08-24 DIAGNOSIS — G4733 Obstructive sleep apnea (adult) (pediatric): Secondary | ICD-10-CM | POA: Diagnosis not present

## 2023-08-27 DIAGNOSIS — U071 COVID-19: Secondary | ICD-10-CM | POA: Diagnosis not present

## 2023-08-27 DIAGNOSIS — A419 Sepsis, unspecified organism: Secondary | ICD-10-CM | POA: Diagnosis not present

## 2023-08-27 DIAGNOSIS — I509 Heart failure, unspecified: Secondary | ICD-10-CM | POA: Diagnosis not present

## 2023-08-27 DIAGNOSIS — I82409 Acute embolism and thrombosis of unspecified deep veins of unspecified lower extremity: Secondary | ICD-10-CM | POA: Diagnosis not present

## 2023-08-27 DIAGNOSIS — I110 Hypertensive heart disease with heart failure: Secondary | ICD-10-CM | POA: Diagnosis not present

## 2023-08-27 DIAGNOSIS — I4891 Unspecified atrial fibrillation: Secondary | ICD-10-CM | POA: Diagnosis not present

## 2023-08-28 DIAGNOSIS — I509 Heart failure, unspecified: Secondary | ICD-10-CM | POA: Diagnosis not present

## 2023-08-28 DIAGNOSIS — I4891 Unspecified atrial fibrillation: Secondary | ICD-10-CM | POA: Diagnosis not present

## 2023-08-28 DIAGNOSIS — U071 COVID-19: Secondary | ICD-10-CM | POA: Diagnosis not present

## 2023-08-28 DIAGNOSIS — A419 Sepsis, unspecified organism: Secondary | ICD-10-CM | POA: Diagnosis not present

## 2023-08-28 DIAGNOSIS — I82409 Acute embolism and thrombosis of unspecified deep veins of unspecified lower extremity: Secondary | ICD-10-CM | POA: Diagnosis not present

## 2023-08-28 DIAGNOSIS — I110 Hypertensive heart disease with heart failure: Secondary | ICD-10-CM | POA: Diagnosis not present

## 2023-08-29 DIAGNOSIS — I509 Heart failure, unspecified: Secondary | ICD-10-CM | POA: Diagnosis not present

## 2023-08-29 DIAGNOSIS — I4891 Unspecified atrial fibrillation: Secondary | ICD-10-CM | POA: Diagnosis not present

## 2023-08-29 DIAGNOSIS — I110 Hypertensive heart disease with heart failure: Secondary | ICD-10-CM | POA: Diagnosis not present

## 2023-08-29 DIAGNOSIS — U071 COVID-19: Secondary | ICD-10-CM | POA: Diagnosis not present

## 2023-08-29 DIAGNOSIS — I82409 Acute embolism and thrombosis of unspecified deep veins of unspecified lower extremity: Secondary | ICD-10-CM | POA: Diagnosis not present

## 2023-08-29 DIAGNOSIS — A419 Sepsis, unspecified organism: Secondary | ICD-10-CM | POA: Diagnosis not present

## 2023-08-30 DIAGNOSIS — I509 Heart failure, unspecified: Secondary | ICD-10-CM | POA: Diagnosis not present

## 2023-08-30 DIAGNOSIS — I82409 Acute embolism and thrombosis of unspecified deep veins of unspecified lower extremity: Secondary | ICD-10-CM | POA: Diagnosis not present

## 2023-08-30 DIAGNOSIS — A419 Sepsis, unspecified organism: Secondary | ICD-10-CM | POA: Diagnosis not present

## 2023-08-30 DIAGNOSIS — U071 COVID-19: Secondary | ICD-10-CM | POA: Diagnosis not present

## 2023-08-30 DIAGNOSIS — I110 Hypertensive heart disease with heart failure: Secondary | ICD-10-CM | POA: Diagnosis not present

## 2023-08-30 DIAGNOSIS — I4891 Unspecified atrial fibrillation: Secondary | ICD-10-CM | POA: Diagnosis not present

## 2023-08-31 DIAGNOSIS — H5340 Unspecified visual field defects: Secondary | ICD-10-CM | POA: Diagnosis not present

## 2023-08-31 DIAGNOSIS — H52223 Regular astigmatism, bilateral: Secondary | ICD-10-CM | POA: Diagnosis not present

## 2023-08-31 DIAGNOSIS — H518 Other specified disorders of binocular movement: Secondary | ICD-10-CM | POA: Diagnosis not present

## 2023-08-31 DIAGNOSIS — H532 Diplopia: Secondary | ICD-10-CM | POA: Diagnosis not present

## 2023-08-31 DIAGNOSIS — H5034 Intermittent alternating exotropia: Secondary | ICD-10-CM | POA: Diagnosis not present

## 2023-08-31 DIAGNOSIS — H5005 Alternating esotropia: Secondary | ICD-10-CM | POA: Diagnosis not present

## 2023-08-31 DIAGNOSIS — H5509 Other forms of nystagmus: Secondary | ICD-10-CM | POA: Diagnosis not present

## 2023-09-03 DIAGNOSIS — A419 Sepsis, unspecified organism: Secondary | ICD-10-CM | POA: Diagnosis not present

## 2023-09-03 DIAGNOSIS — I110 Hypertensive heart disease with heart failure: Secondary | ICD-10-CM | POA: Diagnosis not present

## 2023-09-03 DIAGNOSIS — I82409 Acute embolism and thrombosis of unspecified deep veins of unspecified lower extremity: Secondary | ICD-10-CM | POA: Diagnosis not present

## 2023-09-03 DIAGNOSIS — I4891 Unspecified atrial fibrillation: Secondary | ICD-10-CM | POA: Diagnosis not present

## 2023-09-03 DIAGNOSIS — I509 Heart failure, unspecified: Secondary | ICD-10-CM | POA: Diagnosis not present

## 2023-09-03 DIAGNOSIS — U071 COVID-19: Secondary | ICD-10-CM | POA: Diagnosis not present

## 2023-09-04 DIAGNOSIS — I82409 Acute embolism and thrombosis of unspecified deep veins of unspecified lower extremity: Secondary | ICD-10-CM | POA: Diagnosis not present

## 2023-09-04 DIAGNOSIS — U071 COVID-19: Secondary | ICD-10-CM | POA: Diagnosis not present

## 2023-09-04 DIAGNOSIS — A419 Sepsis, unspecified organism: Secondary | ICD-10-CM | POA: Diagnosis not present

## 2023-09-04 DIAGNOSIS — I509 Heart failure, unspecified: Secondary | ICD-10-CM | POA: Diagnosis not present

## 2023-09-04 DIAGNOSIS — I110 Hypertensive heart disease with heart failure: Secondary | ICD-10-CM | POA: Diagnosis not present

## 2023-09-04 DIAGNOSIS — I4891 Unspecified atrial fibrillation: Secondary | ICD-10-CM | POA: Diagnosis not present

## 2023-09-05 DIAGNOSIS — I4891 Unspecified atrial fibrillation: Secondary | ICD-10-CM | POA: Diagnosis not present

## 2023-09-05 DIAGNOSIS — I110 Hypertensive heart disease with heart failure: Secondary | ICD-10-CM | POA: Diagnosis not present

## 2023-09-05 DIAGNOSIS — U071 COVID-19: Secondary | ICD-10-CM | POA: Diagnosis not present

## 2023-09-05 DIAGNOSIS — I509 Heart failure, unspecified: Secondary | ICD-10-CM | POA: Diagnosis not present

## 2023-09-05 DIAGNOSIS — I82409 Acute embolism and thrombosis of unspecified deep veins of unspecified lower extremity: Secondary | ICD-10-CM | POA: Diagnosis not present

## 2023-09-05 DIAGNOSIS — A419 Sepsis, unspecified organism: Secondary | ICD-10-CM | POA: Diagnosis not present

## 2023-09-06 DIAGNOSIS — I509 Heart failure, unspecified: Secondary | ICD-10-CM | POA: Diagnosis not present

## 2023-09-06 DIAGNOSIS — I110 Hypertensive heart disease with heart failure: Secondary | ICD-10-CM | POA: Diagnosis not present

## 2023-09-06 DIAGNOSIS — I4891 Unspecified atrial fibrillation: Secondary | ICD-10-CM | POA: Diagnosis not present

## 2023-09-06 DIAGNOSIS — U071 COVID-19: Secondary | ICD-10-CM | POA: Diagnosis not present

## 2023-09-06 DIAGNOSIS — I82409 Acute embolism and thrombosis of unspecified deep veins of unspecified lower extremity: Secondary | ICD-10-CM | POA: Diagnosis not present

## 2023-09-06 DIAGNOSIS — A419 Sepsis, unspecified organism: Secondary | ICD-10-CM | POA: Diagnosis not present

## 2023-09-08 DIAGNOSIS — S161XXA Strain of muscle, fascia and tendon at neck level, initial encounter: Secondary | ICD-10-CM | POA: Diagnosis not present

## 2023-09-08 DIAGNOSIS — S0990XA Unspecified injury of head, initial encounter: Secondary | ICD-10-CM | POA: Diagnosis not present

## 2023-09-08 DIAGNOSIS — M542 Cervicalgia: Secondary | ICD-10-CM | POA: Diagnosis not present

## 2023-09-08 DIAGNOSIS — S0003XA Contusion of scalp, initial encounter: Secondary | ICD-10-CM | POA: Diagnosis not present

## 2023-09-09 DIAGNOSIS — M542 Cervicalgia: Secondary | ICD-10-CM | POA: Diagnosis not present

## 2023-09-10 DIAGNOSIS — D485 Neoplasm of uncertain behavior of skin: Secondary | ICD-10-CM | POA: Diagnosis not present

## 2023-09-10 DIAGNOSIS — A419 Sepsis, unspecified organism: Secondary | ICD-10-CM | POA: Diagnosis not present

## 2023-09-10 DIAGNOSIS — I4891 Unspecified atrial fibrillation: Secondary | ICD-10-CM | POA: Diagnosis not present

## 2023-09-10 DIAGNOSIS — U071 COVID-19: Secondary | ICD-10-CM | POA: Diagnosis not present

## 2023-09-10 DIAGNOSIS — Z85828 Personal history of other malignant neoplasm of skin: Secondary | ICD-10-CM | POA: Diagnosis not present

## 2023-09-10 DIAGNOSIS — I110 Hypertensive heart disease with heart failure: Secondary | ICD-10-CM | POA: Diagnosis not present

## 2023-09-10 DIAGNOSIS — I509 Heart failure, unspecified: Secondary | ICD-10-CM | POA: Diagnosis not present

## 2023-09-10 DIAGNOSIS — Z08 Encounter for follow-up examination after completed treatment for malignant neoplasm: Secondary | ICD-10-CM | POA: Diagnosis not present

## 2023-09-10 DIAGNOSIS — I82409 Acute embolism and thrombosis of unspecified deep veins of unspecified lower extremity: Secondary | ICD-10-CM | POA: Diagnosis not present

## 2023-09-11 DIAGNOSIS — I110 Hypertensive heart disease with heart failure: Secondary | ICD-10-CM | POA: Diagnosis not present

## 2023-09-11 DIAGNOSIS — U071 COVID-19: Secondary | ICD-10-CM | POA: Diagnosis not present

## 2023-09-11 DIAGNOSIS — I4891 Unspecified atrial fibrillation: Secondary | ICD-10-CM | POA: Diagnosis not present

## 2023-09-11 DIAGNOSIS — A419 Sepsis, unspecified organism: Secondary | ICD-10-CM | POA: Diagnosis not present

## 2023-09-11 DIAGNOSIS — I82409 Acute embolism and thrombosis of unspecified deep veins of unspecified lower extremity: Secondary | ICD-10-CM | POA: Diagnosis not present

## 2023-09-11 DIAGNOSIS — I509 Heart failure, unspecified: Secondary | ICD-10-CM | POA: Diagnosis not present

## 2023-09-12 DIAGNOSIS — I509 Heart failure, unspecified: Secondary | ICD-10-CM | POA: Diagnosis not present

## 2023-09-12 DIAGNOSIS — Z049 Encounter for examination and observation for unspecified reason: Secondary | ICD-10-CM | POA: Diagnosis not present

## 2023-09-12 DIAGNOSIS — I82409 Acute embolism and thrombosis of unspecified deep veins of unspecified lower extremity: Secondary | ICD-10-CM | POA: Diagnosis not present

## 2023-09-12 DIAGNOSIS — I4891 Unspecified atrial fibrillation: Secondary | ICD-10-CM | POA: Diagnosis not present

## 2023-09-12 DIAGNOSIS — A419 Sepsis, unspecified organism: Secondary | ICD-10-CM | POA: Diagnosis not present

## 2023-09-12 DIAGNOSIS — I110 Hypertensive heart disease with heart failure: Secondary | ICD-10-CM | POA: Diagnosis not present

## 2023-09-12 DIAGNOSIS — U071 COVID-19: Secondary | ICD-10-CM | POA: Diagnosis not present

## 2023-09-12 DIAGNOSIS — Z7689 Persons encountering health services in other specified circumstances: Secondary | ICD-10-CM | POA: Diagnosis not present

## 2023-09-13 DIAGNOSIS — I509 Heart failure, unspecified: Secondary | ICD-10-CM | POA: Diagnosis not present

## 2023-09-13 DIAGNOSIS — I4891 Unspecified atrial fibrillation: Secondary | ICD-10-CM | POA: Diagnosis not present

## 2023-09-13 DIAGNOSIS — U071 COVID-19: Secondary | ICD-10-CM | POA: Diagnosis not present

## 2023-09-13 DIAGNOSIS — I82409 Acute embolism and thrombosis of unspecified deep veins of unspecified lower extremity: Secondary | ICD-10-CM | POA: Diagnosis not present

## 2023-09-13 DIAGNOSIS — A419 Sepsis, unspecified organism: Secondary | ICD-10-CM | POA: Diagnosis not present

## 2023-09-13 DIAGNOSIS — I110 Hypertensive heart disease with heart failure: Secondary | ICD-10-CM | POA: Diagnosis not present

## 2023-09-17 DIAGNOSIS — I82409 Acute embolism and thrombosis of unspecified deep veins of unspecified lower extremity: Secondary | ICD-10-CM | POA: Diagnosis not present

## 2023-09-17 DIAGNOSIS — A419 Sepsis, unspecified organism: Secondary | ICD-10-CM | POA: Diagnosis not present

## 2023-09-17 DIAGNOSIS — I110 Hypertensive heart disease with heart failure: Secondary | ICD-10-CM | POA: Diagnosis not present

## 2023-09-17 DIAGNOSIS — I509 Heart failure, unspecified: Secondary | ICD-10-CM | POA: Diagnosis not present

## 2023-09-17 DIAGNOSIS — U071 COVID-19: Secondary | ICD-10-CM | POA: Diagnosis not present

## 2023-09-17 DIAGNOSIS — I4891 Unspecified atrial fibrillation: Secondary | ICD-10-CM | POA: Diagnosis not present

## 2023-09-21 DIAGNOSIS — I82409 Acute embolism and thrombosis of unspecified deep veins of unspecified lower extremity: Secondary | ICD-10-CM | POA: Diagnosis not present

## 2023-09-21 DIAGNOSIS — I509 Heart failure, unspecified: Secondary | ICD-10-CM | POA: Diagnosis not present

## 2023-09-21 DIAGNOSIS — I110 Hypertensive heart disease with heart failure: Secondary | ICD-10-CM | POA: Diagnosis not present

## 2023-09-21 DIAGNOSIS — I4891 Unspecified atrial fibrillation: Secondary | ICD-10-CM | POA: Diagnosis not present

## 2023-09-21 DIAGNOSIS — A419 Sepsis, unspecified organism: Secondary | ICD-10-CM | POA: Diagnosis not present

## 2023-09-21 DIAGNOSIS — U071 COVID-19: Secondary | ICD-10-CM | POA: Diagnosis not present

## 2023-09-24 DIAGNOSIS — I509 Heart failure, unspecified: Secondary | ICD-10-CM | POA: Diagnosis not present

## 2023-09-24 DIAGNOSIS — U071 COVID-19: Secondary | ICD-10-CM | POA: Diagnosis not present

## 2023-09-24 DIAGNOSIS — A419 Sepsis, unspecified organism: Secondary | ICD-10-CM | POA: Diagnosis not present

## 2023-09-24 DIAGNOSIS — I82409 Acute embolism and thrombosis of unspecified deep veins of unspecified lower extremity: Secondary | ICD-10-CM | POA: Diagnosis not present

## 2023-09-24 DIAGNOSIS — I4891 Unspecified atrial fibrillation: Secondary | ICD-10-CM | POA: Diagnosis not present

## 2023-09-24 DIAGNOSIS — I110 Hypertensive heart disease with heart failure: Secondary | ICD-10-CM | POA: Diagnosis not present

## 2023-09-27 DIAGNOSIS — I509 Heart failure, unspecified: Secondary | ICD-10-CM | POA: Diagnosis not present

## 2023-09-27 DIAGNOSIS — I4891 Unspecified atrial fibrillation: Secondary | ICD-10-CM | POA: Diagnosis not present

## 2023-09-27 DIAGNOSIS — I110 Hypertensive heart disease with heart failure: Secondary | ICD-10-CM | POA: Diagnosis not present

## 2023-09-27 DIAGNOSIS — I82409 Acute embolism and thrombosis of unspecified deep veins of unspecified lower extremity: Secondary | ICD-10-CM | POA: Diagnosis not present

## 2023-09-27 DIAGNOSIS — A419 Sepsis, unspecified organism: Secondary | ICD-10-CM | POA: Diagnosis not present

## 2023-09-27 DIAGNOSIS — U071 COVID-19: Secondary | ICD-10-CM | POA: Diagnosis not present

## 2023-09-28 DIAGNOSIS — I82409 Acute embolism and thrombosis of unspecified deep veins of unspecified lower extremity: Secondary | ICD-10-CM | POA: Diagnosis not present

## 2023-09-28 DIAGNOSIS — U071 COVID-19: Secondary | ICD-10-CM | POA: Diagnosis not present

## 2023-09-28 DIAGNOSIS — I509 Heart failure, unspecified: Secondary | ICD-10-CM | POA: Diagnosis not present

## 2023-09-28 DIAGNOSIS — I4891 Unspecified atrial fibrillation: Secondary | ICD-10-CM | POA: Diagnosis not present

## 2023-09-28 DIAGNOSIS — A419 Sepsis, unspecified organism: Secondary | ICD-10-CM | POA: Diagnosis not present

## 2023-09-28 DIAGNOSIS — I110 Hypertensive heart disease with heart failure: Secondary | ICD-10-CM | POA: Diagnosis not present

## 2023-10-01 DIAGNOSIS — I509 Heart failure, unspecified: Secondary | ICD-10-CM | POA: Diagnosis not present

## 2023-10-01 DIAGNOSIS — A419 Sepsis, unspecified organism: Secondary | ICD-10-CM | POA: Diagnosis not present

## 2023-10-01 DIAGNOSIS — I82409 Acute embolism and thrombosis of unspecified deep veins of unspecified lower extremity: Secondary | ICD-10-CM | POA: Diagnosis not present

## 2023-10-01 DIAGNOSIS — U071 COVID-19: Secondary | ICD-10-CM | POA: Diagnosis not present

## 2023-10-01 DIAGNOSIS — I4891 Unspecified atrial fibrillation: Secondary | ICD-10-CM | POA: Diagnosis not present

## 2023-10-01 DIAGNOSIS — I110 Hypertensive heart disease with heart failure: Secondary | ICD-10-CM | POA: Diagnosis not present

## 2023-10-03 DIAGNOSIS — I82409 Acute embolism and thrombosis of unspecified deep veins of unspecified lower extremity: Secondary | ICD-10-CM | POA: Diagnosis not present

## 2023-10-03 DIAGNOSIS — U071 COVID-19: Secondary | ICD-10-CM | POA: Diagnosis not present

## 2023-10-03 DIAGNOSIS — I509 Heart failure, unspecified: Secondary | ICD-10-CM | POA: Diagnosis not present

## 2023-10-03 DIAGNOSIS — A419 Sepsis, unspecified organism: Secondary | ICD-10-CM | POA: Diagnosis not present

## 2023-10-03 DIAGNOSIS — I110 Hypertensive heart disease with heart failure: Secondary | ICD-10-CM | POA: Diagnosis not present

## 2023-10-03 DIAGNOSIS — I4891 Unspecified atrial fibrillation: Secondary | ICD-10-CM | POA: Diagnosis not present

## 2023-10-10 DIAGNOSIS — I4891 Unspecified atrial fibrillation: Secondary | ICD-10-CM | POA: Diagnosis not present

## 2023-10-10 DIAGNOSIS — I110 Hypertensive heart disease with heart failure: Secondary | ICD-10-CM | POA: Diagnosis not present

## 2023-10-10 DIAGNOSIS — U071 COVID-19: Secondary | ICD-10-CM | POA: Diagnosis not present

## 2023-10-10 DIAGNOSIS — I82409 Acute embolism and thrombosis of unspecified deep veins of unspecified lower extremity: Secondary | ICD-10-CM | POA: Diagnosis not present

## 2023-10-10 DIAGNOSIS — I509 Heart failure, unspecified: Secondary | ICD-10-CM | POA: Diagnosis not present

## 2023-10-10 DIAGNOSIS — A419 Sepsis, unspecified organism: Secondary | ICD-10-CM | POA: Diagnosis not present

## 2023-10-11 DIAGNOSIS — L814 Other melanin hyperpigmentation: Secondary | ICD-10-CM | POA: Diagnosis not present

## 2023-10-11 DIAGNOSIS — Z85828 Personal history of other malignant neoplasm of skin: Secondary | ICD-10-CM | POA: Diagnosis not present

## 2023-10-11 DIAGNOSIS — C44712 Basal cell carcinoma of skin of right lower limb, including hip: Secondary | ICD-10-CM | POA: Diagnosis not present

## 2023-10-11 DIAGNOSIS — L821 Other seborrheic keratosis: Secondary | ICD-10-CM | POA: Diagnosis not present

## 2023-10-11 DIAGNOSIS — Z08 Encounter for follow-up examination after completed treatment for malignant neoplasm: Secondary | ICD-10-CM | POA: Diagnosis not present

## 2023-10-27 DIAGNOSIS — S62323A Displaced fracture of shaft of third metacarpal bone, left hand, initial encounter for closed fracture: Secondary | ICD-10-CM | POA: Diagnosis not present

## 2023-10-27 DIAGNOSIS — D6869 Other thrombophilia: Secondary | ICD-10-CM | POA: Diagnosis not present

## 2023-10-27 DIAGNOSIS — I10 Essential (primary) hypertension: Secondary | ICD-10-CM | POA: Diagnosis not present

## 2023-10-27 DIAGNOSIS — Z87820 Personal history of traumatic brain injury: Secondary | ICD-10-CM | POA: Diagnosis not present

## 2023-10-27 DIAGNOSIS — S199XXA Unspecified injury of neck, initial encounter: Secondary | ICD-10-CM | POA: Diagnosis not present

## 2023-10-27 DIAGNOSIS — R1310 Dysphagia, unspecified: Secondary | ICD-10-CM | POA: Diagnosis not present

## 2023-10-27 DIAGNOSIS — Z743 Need for continuous supervision: Secondary | ICD-10-CM | POA: Diagnosis not present

## 2023-10-27 DIAGNOSIS — G8194 Hemiplegia, unspecified affecting left nondominant side: Secondary | ICD-10-CM | POA: Diagnosis not present

## 2023-10-27 DIAGNOSIS — R6889 Other general symptoms and signs: Secondary | ICD-10-CM | POA: Diagnosis not present

## 2023-10-27 DIAGNOSIS — W19XXXD Unspecified fall, subsequent encounter: Secondary | ICD-10-CM | POA: Diagnosis not present

## 2023-10-27 DIAGNOSIS — R1311 Dysphagia, oral phase: Secondary | ICD-10-CM | POA: Diagnosis not present

## 2023-10-27 DIAGNOSIS — I69320 Aphasia following cerebral infarction: Secondary | ICD-10-CM | POA: Diagnosis not present

## 2023-10-27 DIAGNOSIS — S62392D Other fracture of third metacarpal bone, right hand, subsequent encounter for fracture with routine healing: Secondary | ICD-10-CM | POA: Diagnosis not present

## 2023-10-27 DIAGNOSIS — S2231XA Fracture of one rib, right side, initial encounter for closed fracture: Secondary | ICD-10-CM | POA: Diagnosis not present

## 2023-10-27 DIAGNOSIS — R609 Edema, unspecified: Secondary | ICD-10-CM | POA: Diagnosis not present

## 2023-10-27 DIAGNOSIS — R5383 Other fatigue: Secondary | ICD-10-CM | POA: Diagnosis not present

## 2023-10-27 DIAGNOSIS — R413 Other amnesia: Secondary | ICD-10-CM | POA: Diagnosis not present

## 2023-10-27 DIAGNOSIS — S59912A Unspecified injury of left forearm, initial encounter: Secondary | ICD-10-CM | POA: Diagnosis not present

## 2023-10-27 DIAGNOSIS — G8929 Other chronic pain: Secondary | ICD-10-CM | POA: Diagnosis not present

## 2023-10-27 DIAGNOSIS — R9431 Abnormal electrocardiogram [ECG] [EKG]: Secondary | ICD-10-CM | POA: Diagnosis not present

## 2023-10-27 DIAGNOSIS — G4700 Insomnia, unspecified: Secondary | ICD-10-CM | POA: Diagnosis not present

## 2023-10-27 DIAGNOSIS — R58 Hemorrhage, not elsewhere classified: Secondary | ICD-10-CM | POA: Diagnosis not present

## 2023-10-27 DIAGNOSIS — M4800 Spinal stenosis, site unspecified: Secondary | ICD-10-CM | POA: Diagnosis not present

## 2023-10-27 DIAGNOSIS — S065XAA Traumatic subdural hemorrhage with loss of consciousness status unknown, initial encounter: Secondary | ICD-10-CM | POA: Diagnosis not present

## 2023-10-27 DIAGNOSIS — M4150 Other secondary scoliosis, site unspecified: Secondary | ICD-10-CM | POA: Diagnosis not present

## 2023-10-27 DIAGNOSIS — Z8616 Personal history of COVID-19: Secondary | ICD-10-CM | POA: Diagnosis not present

## 2023-10-27 DIAGNOSIS — W1830XA Fall on same level, unspecified, initial encounter: Secondary | ICD-10-CM | POA: Diagnosis not present

## 2023-10-27 DIAGNOSIS — Z4689 Encounter for fitting and adjustment of other specified devices: Secondary | ICD-10-CM | POA: Diagnosis not present

## 2023-10-27 DIAGNOSIS — Z66 Do not resuscitate: Secondary | ICD-10-CM | POA: Diagnosis not present

## 2023-10-27 DIAGNOSIS — S2231XD Fracture of one rib, right side, subsequent encounter for fracture with routine healing: Secondary | ICD-10-CM | POA: Diagnosis not present

## 2023-10-27 DIAGNOSIS — S4992XA Unspecified injury of left shoulder and upper arm, initial encounter: Secondary | ICD-10-CM | POA: Diagnosis not present

## 2023-10-27 DIAGNOSIS — J9811 Atelectasis: Secondary | ICD-10-CM | POA: Diagnosis not present

## 2023-10-27 DIAGNOSIS — R296 Repeated falls: Secondary | ICD-10-CM | POA: Diagnosis not present

## 2023-10-27 DIAGNOSIS — S0181XA Laceration without foreign body of other part of head, initial encounter: Secondary | ICD-10-CM | POA: Diagnosis not present

## 2023-10-27 DIAGNOSIS — Z515 Encounter for palliative care: Secondary | ICD-10-CM | POA: Diagnosis not present

## 2023-10-27 DIAGNOSIS — G8911 Acute pain due to trauma: Secondary | ICD-10-CM | POA: Diagnosis not present

## 2023-10-27 DIAGNOSIS — Z86711 Personal history of pulmonary embolism: Secondary | ICD-10-CM | POA: Diagnosis not present

## 2023-10-27 DIAGNOSIS — S065X0A Traumatic subdural hemorrhage without loss of consciousness, initial encounter: Secondary | ICD-10-CM | POA: Diagnosis not present

## 2023-10-27 DIAGNOSIS — R509 Fever, unspecified: Secondary | ICD-10-CM | POA: Diagnosis not present

## 2023-10-27 DIAGNOSIS — S299XXA Unspecified injury of thorax, initial encounter: Secondary | ICD-10-CM | POA: Diagnosis not present

## 2023-10-27 DIAGNOSIS — I480 Paroxysmal atrial fibrillation: Secondary | ICD-10-CM | POA: Diagnosis not present

## 2023-10-27 DIAGNOSIS — R918 Other nonspecific abnormal finding of lung field: Secondary | ICD-10-CM | POA: Diagnosis not present

## 2023-10-27 DIAGNOSIS — Z86718 Personal history of other venous thrombosis and embolism: Secondary | ICD-10-CM | POA: Diagnosis not present

## 2023-10-27 DIAGNOSIS — Z9981 Dependence on supplemental oxygen: Secondary | ICD-10-CM | POA: Diagnosis not present

## 2023-10-27 DIAGNOSIS — W1809XA Striking against other object with subsequent fall, initial encounter: Secondary | ICD-10-CM | POA: Diagnosis not present

## 2023-10-27 DIAGNOSIS — J9601 Acute respiratory failure with hypoxia: Secondary | ICD-10-CM | POA: Diagnosis not present

## 2023-10-27 DIAGNOSIS — M1990 Unspecified osteoarthritis, unspecified site: Secondary | ICD-10-CM | POA: Diagnosis not present

## 2023-10-27 DIAGNOSIS — R633 Feeding difficulties, unspecified: Secondary | ICD-10-CM | POA: Diagnosis not present

## 2023-10-27 DIAGNOSIS — M810 Age-related osteoporosis without current pathological fracture: Secondary | ICD-10-CM | POA: Diagnosis not present

## 2023-10-27 DIAGNOSIS — Z87828 Personal history of other (healed) physical injury and trauma: Secondary | ICD-10-CM | POA: Diagnosis not present

## 2023-10-27 DIAGNOSIS — S0511XA Contusion of eyeball and orbital tissues, right eye, initial encounter: Secondary | ICD-10-CM | POA: Diagnosis not present

## 2023-10-27 DIAGNOSIS — Z96653 Presence of artificial knee joint, bilateral: Secondary | ICD-10-CM | POA: Diagnosis not present

## 2023-10-27 DIAGNOSIS — R531 Weakness: Secondary | ICD-10-CM | POA: Diagnosis not present

## 2023-10-27 DIAGNOSIS — K5900 Constipation, unspecified: Secondary | ICD-10-CM | POA: Diagnosis not present

## 2023-10-27 DIAGNOSIS — I6200 Nontraumatic subdural hemorrhage, unspecified: Secondary | ICD-10-CM | POA: Diagnosis not present

## 2023-10-27 DIAGNOSIS — I739 Peripheral vascular disease, unspecified: Secondary | ICD-10-CM | POA: Diagnosis not present

## 2023-10-27 DIAGNOSIS — M549 Dorsalgia, unspecified: Secondary | ICD-10-CM | POA: Diagnosis not present

## 2023-10-27 DIAGNOSIS — S62303A Unspecified fracture of third metacarpal bone, left hand, initial encounter for closed fracture: Secondary | ICD-10-CM | POA: Diagnosis not present

## 2023-10-27 DIAGNOSIS — Z7901 Long term (current) use of anticoagulants: Secondary | ICD-10-CM | POA: Diagnosis not present

## 2023-10-27 DIAGNOSIS — S01111A Laceration without foreign body of right eyelid and periocular area, initial encounter: Secondary | ICD-10-CM | POA: Diagnosis not present

## 2023-10-27 DIAGNOSIS — I4891 Unspecified atrial fibrillation: Secondary | ICD-10-CM | POA: Diagnosis not present

## 2023-10-27 DIAGNOSIS — R791 Abnormal coagulation profile: Secondary | ICD-10-CM | POA: Diagnosis not present

## 2023-10-27 DIAGNOSIS — I499 Cardiac arrhythmia, unspecified: Secondary | ICD-10-CM | POA: Diagnosis not present

## 2023-10-27 DIAGNOSIS — G935 Compression of brain: Secondary | ICD-10-CM | POA: Diagnosis not present

## 2023-10-27 DIAGNOSIS — W19XXXA Unspecified fall, initial encounter: Secondary | ICD-10-CM | POA: Diagnosis not present

## 2023-10-27 DIAGNOSIS — Z4682 Encounter for fitting and adjustment of non-vascular catheter: Secondary | ICD-10-CM | POA: Diagnosis not present

## 2023-11-09 DIAGNOSIS — I2602 Saddle embolus of pulmonary artery with acute cor pulmonale: Secondary | ICD-10-CM | POA: Diagnosis not present

## 2023-11-09 DIAGNOSIS — I82401 Acute embolism and thrombosis of unspecified deep veins of right lower extremity: Secondary | ICD-10-CM | POA: Diagnosis not present

## 2023-11-09 DIAGNOSIS — Z9981 Dependence on supplemental oxygen: Secondary | ICD-10-CM | POA: Diagnosis not present

## 2023-11-09 DIAGNOSIS — S2231XA Fracture of one rib, right side, initial encounter for closed fracture: Secondary | ICD-10-CM | POA: Diagnosis not present

## 2023-11-09 DIAGNOSIS — K5900 Constipation, unspecified: Secondary | ICD-10-CM | POA: Diagnosis not present

## 2023-11-09 DIAGNOSIS — R0902 Hypoxemia: Secondary | ICD-10-CM | POA: Diagnosis not present

## 2023-11-09 DIAGNOSIS — S065X0A Traumatic subdural hemorrhage without loss of consciousness, initial encounter: Secondary | ICD-10-CM | POA: Diagnosis not present

## 2023-11-09 DIAGNOSIS — W19XXXD Unspecified fall, subsequent encounter: Secondary | ICD-10-CM | POA: Diagnosis not present

## 2023-11-09 DIAGNOSIS — J9601 Acute respiratory failure with hypoxia: Secondary | ICD-10-CM | POA: Diagnosis not present

## 2023-11-09 DIAGNOSIS — E872 Acidosis, unspecified: Secondary | ICD-10-CM | POA: Diagnosis not present

## 2023-11-09 DIAGNOSIS — I69320 Aphasia following cerebral infarction: Secondary | ICD-10-CM | POA: Diagnosis not present

## 2023-11-09 DIAGNOSIS — I2699 Other pulmonary embolism without acute cor pulmonale: Secondary | ICD-10-CM | POA: Diagnosis not present

## 2023-11-09 DIAGNOSIS — M4800 Spinal stenosis, site unspecified: Secondary | ICD-10-CM | POA: Diagnosis not present

## 2023-11-09 DIAGNOSIS — R5383 Other fatigue: Secondary | ICD-10-CM | POA: Diagnosis not present

## 2023-11-09 DIAGNOSIS — R5381 Other malaise: Secondary | ICD-10-CM | POA: Diagnosis not present

## 2023-11-09 DIAGNOSIS — R6889 Other general symptoms and signs: Secondary | ICD-10-CM | POA: Diagnosis not present

## 2023-11-09 DIAGNOSIS — Z7401 Bed confinement status: Secondary | ICD-10-CM | POA: Diagnosis not present

## 2023-11-09 DIAGNOSIS — R413 Other amnesia: Secondary | ICD-10-CM | POA: Diagnosis not present

## 2023-11-09 DIAGNOSIS — R0602 Shortness of breath: Secondary | ICD-10-CM | POA: Diagnosis not present

## 2023-11-09 DIAGNOSIS — Z1152 Encounter for screening for COVID-19: Secondary | ICD-10-CM | POA: Diagnosis not present

## 2023-11-09 DIAGNOSIS — S065XAA Traumatic subdural hemorrhage with loss of consciousness status unknown, initial encounter: Secondary | ICD-10-CM | POA: Diagnosis not present

## 2023-11-09 DIAGNOSIS — I480 Paroxysmal atrial fibrillation: Secondary | ICD-10-CM | POA: Diagnosis not present

## 2023-11-09 DIAGNOSIS — I959 Hypotension, unspecified: Secondary | ICD-10-CM | POA: Diagnosis not present

## 2023-11-09 DIAGNOSIS — Z515 Encounter for palliative care: Secondary | ICD-10-CM | POA: Diagnosis not present

## 2023-11-09 DIAGNOSIS — I6202 Nontraumatic subacute subdural hemorrhage: Secondary | ICD-10-CM | POA: Diagnosis not present

## 2023-11-09 DIAGNOSIS — Z86711 Personal history of pulmonary embolism: Secondary | ICD-10-CM | POA: Diagnosis not present

## 2023-11-09 DIAGNOSIS — R1310 Dysphagia, unspecified: Secondary | ICD-10-CM | POA: Diagnosis not present

## 2023-11-09 DIAGNOSIS — Z7189 Other specified counseling: Secondary | ICD-10-CM | POA: Diagnosis not present

## 2023-11-09 DIAGNOSIS — M810 Age-related osteoporosis without current pathological fracture: Secondary | ICD-10-CM | POA: Diagnosis not present

## 2023-11-09 DIAGNOSIS — I6200 Nontraumatic subdural hemorrhage, unspecified: Secondary | ICD-10-CM | POA: Diagnosis not present

## 2023-11-09 DIAGNOSIS — Z87820 Personal history of traumatic brain injury: Secondary | ICD-10-CM | POA: Diagnosis not present

## 2023-11-09 DIAGNOSIS — Z7901 Long term (current) use of anticoagulants: Secondary | ICD-10-CM | POA: Diagnosis not present

## 2023-11-09 DIAGNOSIS — E860 Dehydration: Secondary | ICD-10-CM | POA: Diagnosis not present

## 2023-11-09 DIAGNOSIS — I499 Cardiac arrhythmia, unspecified: Secondary | ICD-10-CM | POA: Diagnosis not present

## 2023-11-09 DIAGNOSIS — S065X0D Traumatic subdural hemorrhage without loss of consciousness, subsequent encounter: Secondary | ICD-10-CM | POA: Diagnosis not present

## 2023-11-09 DIAGNOSIS — S2231XD Fracture of one rib, right side, subsequent encounter for fracture with routine healing: Secondary | ICD-10-CM | POA: Diagnosis not present

## 2023-11-09 DIAGNOSIS — Z66 Do not resuscitate: Secondary | ICD-10-CM | POA: Diagnosis not present

## 2023-11-09 DIAGNOSIS — W19XXXA Unspecified fall, initial encounter: Secondary | ICD-10-CM | POA: Diagnosis not present

## 2023-11-09 DIAGNOSIS — Z4689 Encounter for fitting and adjustment of other specified devices: Secondary | ICD-10-CM | POA: Diagnosis not present

## 2023-11-09 DIAGNOSIS — R0600 Dyspnea, unspecified: Secondary | ICD-10-CM | POA: Diagnosis not present

## 2023-11-09 DIAGNOSIS — Z743 Need for continuous supervision: Secondary | ICD-10-CM | POA: Diagnosis not present

## 2023-11-09 DIAGNOSIS — L97509 Non-pressure chronic ulcer of other part of unspecified foot with unspecified severity: Secondary | ICD-10-CM | POA: Diagnosis not present

## 2023-11-09 DIAGNOSIS — G9341 Metabolic encephalopathy: Secondary | ICD-10-CM | POA: Diagnosis not present

## 2023-11-09 DIAGNOSIS — Z96653 Presence of artificial knee joint, bilateral: Secondary | ICD-10-CM | POA: Diagnosis not present

## 2023-11-09 DIAGNOSIS — I82402 Acute embolism and thrombosis of unspecified deep veins of left lower extremity: Secondary | ICD-10-CM | POA: Diagnosis not present

## 2023-11-09 DIAGNOSIS — N289 Disorder of kidney and ureter, unspecified: Secondary | ICD-10-CM | POA: Diagnosis not present

## 2023-11-09 DIAGNOSIS — R000 Tachycardia, unspecified: Secondary | ICD-10-CM | POA: Diagnosis not present

## 2023-11-09 DIAGNOSIS — I10 Essential (primary) hypertension: Secondary | ICD-10-CM | POA: Diagnosis not present

## 2023-11-09 DIAGNOSIS — G9009 Other idiopathic peripheral autonomic neuropathy: Secondary | ICD-10-CM | POA: Diagnosis not present

## 2023-11-09 DIAGNOSIS — R404 Transient alteration of awareness: Secondary | ICD-10-CM | POA: Diagnosis not present

## 2023-11-09 DIAGNOSIS — Z5181 Encounter for therapeutic drug level monitoring: Secondary | ICD-10-CM | POA: Diagnosis not present

## 2023-11-09 DIAGNOSIS — S62392D Other fracture of third metacarpal bone, right hand, subsequent encounter for fracture with routine healing: Secondary | ICD-10-CM | POA: Diagnosis not present

## 2023-11-09 DIAGNOSIS — R0781 Pleurodynia: Secondary | ICD-10-CM | POA: Diagnosis not present

## 2023-11-09 DIAGNOSIS — R531 Weakness: Secondary | ICD-10-CM | POA: Diagnosis not present

## 2023-11-09 DIAGNOSIS — G4700 Insomnia, unspecified: Secondary | ICD-10-CM | POA: Diagnosis not present

## 2023-11-09 DIAGNOSIS — G9340 Encephalopathy, unspecified: Secondary | ICD-10-CM | POA: Diagnosis not present

## 2023-11-09 DIAGNOSIS — R1013 Epigastric pain: Secondary | ICD-10-CM | POA: Diagnosis not present

## 2023-11-09 DIAGNOSIS — R1311 Dysphagia, oral phase: Secondary | ICD-10-CM | POA: Diagnosis not present

## 2023-11-09 DIAGNOSIS — I4891 Unspecified atrial fibrillation: Secondary | ICD-10-CM | POA: Diagnosis not present

## 2023-11-09 DIAGNOSIS — M8000XD Age-related osteoporosis with current pathological fracture, unspecified site, subsequent encounter for fracture with routine healing: Secondary | ICD-10-CM | POA: Diagnosis not present

## 2023-11-09 DIAGNOSIS — E876 Hypokalemia: Secondary | ICD-10-CM | POA: Diagnosis not present

## 2023-11-09 DIAGNOSIS — Z79899 Other long term (current) drug therapy: Secondary | ICD-10-CM | POA: Diagnosis not present

## 2023-11-09 DIAGNOSIS — Z86718 Personal history of other venous thrombosis and embolism: Secondary | ICD-10-CM | POA: Diagnosis not present

## 2023-11-09 DIAGNOSIS — R509 Fever, unspecified: Secondary | ICD-10-CM | POA: Diagnosis not present

## 2023-11-09 DIAGNOSIS — R410 Disorientation, unspecified: Secondary | ICD-10-CM | POA: Diagnosis not present

## 2023-11-09 DIAGNOSIS — R4701 Aphasia: Secondary | ICD-10-CM | POA: Diagnosis not present

## 2023-11-09 DIAGNOSIS — M6281 Muscle weakness (generalized): Secondary | ICD-10-CM | POA: Diagnosis not present

## 2023-11-09 DIAGNOSIS — I739 Peripheral vascular disease, unspecified: Secondary | ICD-10-CM | POA: Diagnosis not present

## 2023-11-11 DIAGNOSIS — I499 Cardiac arrhythmia, unspecified: Secondary | ICD-10-CM | POA: Diagnosis not present

## 2023-11-11 DIAGNOSIS — Z7401 Bed confinement status: Secondary | ICD-10-CM | POA: Diagnosis not present

## 2023-11-11 DIAGNOSIS — Z66 Do not resuscitate: Secondary | ICD-10-CM | POA: Diagnosis not present

## 2023-11-11 DIAGNOSIS — R404 Transient alteration of awareness: Secondary | ICD-10-CM | POA: Diagnosis not present

## 2023-11-11 DIAGNOSIS — Z515 Encounter for palliative care: Secondary | ICD-10-CM | POA: Diagnosis not present

## 2023-11-11 DIAGNOSIS — I10 Essential (primary) hypertension: Secondary | ICD-10-CM | POA: Diagnosis not present

## 2023-11-11 DIAGNOSIS — I4891 Unspecified atrial fibrillation: Secondary | ICD-10-CM | POA: Diagnosis not present

## 2023-11-11 DIAGNOSIS — R0781 Pleurodynia: Secondary | ICD-10-CM | POA: Diagnosis not present

## 2023-11-11 DIAGNOSIS — G4700 Insomnia, unspecified: Secondary | ICD-10-CM | POA: Diagnosis not present

## 2023-11-11 DIAGNOSIS — R1310 Dysphagia, unspecified: Secondary | ICD-10-CM | POA: Diagnosis not present

## 2023-11-11 DIAGNOSIS — R509 Fever, unspecified: Secondary | ICD-10-CM | POA: Diagnosis not present

## 2023-11-11 DIAGNOSIS — R1311 Dysphagia, oral phase: Secondary | ICD-10-CM | POA: Diagnosis not present

## 2023-11-11 DIAGNOSIS — R410 Disorientation, unspecified: Secondary | ICD-10-CM | POA: Diagnosis not present

## 2023-11-11 DIAGNOSIS — S065X0A Traumatic subdural hemorrhage without loss of consciousness, initial encounter: Secondary | ICD-10-CM | POA: Diagnosis not present

## 2023-11-11 DIAGNOSIS — W19XXXA Unspecified fall, initial encounter: Secondary | ICD-10-CM | POA: Diagnosis not present

## 2023-11-11 DIAGNOSIS — W19XXXD Unspecified fall, subsequent encounter: Secondary | ICD-10-CM | POA: Diagnosis not present

## 2023-11-11 DIAGNOSIS — M810 Age-related osteoporosis without current pathological fracture: Secondary | ICD-10-CM | POA: Diagnosis not present

## 2023-11-11 DIAGNOSIS — Z9981 Dependence on supplemental oxygen: Secondary | ICD-10-CM | POA: Diagnosis not present

## 2023-11-11 DIAGNOSIS — I959 Hypotension, unspecified: Secondary | ICD-10-CM | POA: Diagnosis not present

## 2023-11-11 DIAGNOSIS — I69320 Aphasia following cerebral infarction: Secondary | ICD-10-CM | POA: Diagnosis not present

## 2023-11-11 DIAGNOSIS — I739 Peripheral vascular disease, unspecified: Secondary | ICD-10-CM | POA: Diagnosis not present

## 2023-11-11 DIAGNOSIS — S065X0D Traumatic subdural hemorrhage without loss of consciousness, subsequent encounter: Secondary | ICD-10-CM | POA: Diagnosis not present

## 2023-11-11 DIAGNOSIS — Z4689 Encounter for fitting and adjustment of other specified devices: Secondary | ICD-10-CM | POA: Diagnosis not present

## 2023-11-11 DIAGNOSIS — M4800 Spinal stenosis, site unspecified: Secondary | ICD-10-CM | POA: Diagnosis not present

## 2023-11-11 DIAGNOSIS — G9340 Encephalopathy, unspecified: Secondary | ICD-10-CM | POA: Diagnosis not present

## 2023-11-11 DIAGNOSIS — R6889 Other general symptoms and signs: Secondary | ICD-10-CM | POA: Diagnosis not present

## 2023-11-11 DIAGNOSIS — S2231XD Fracture of one rib, right side, subsequent encounter for fracture with routine healing: Secondary | ICD-10-CM | POA: Diagnosis not present

## 2023-11-11 DIAGNOSIS — Z743 Need for continuous supervision: Secondary | ICD-10-CM | POA: Diagnosis not present

## 2023-11-11 DIAGNOSIS — E860 Dehydration: Secondary | ICD-10-CM | POA: Diagnosis not present

## 2023-11-11 DIAGNOSIS — K5900 Constipation, unspecified: Secondary | ICD-10-CM | POA: Diagnosis not present

## 2023-11-11 DIAGNOSIS — S62392D Other fracture of third metacarpal bone, right hand, subsequent encounter for fracture with routine healing: Secondary | ICD-10-CM | POA: Diagnosis not present

## 2023-11-11 DIAGNOSIS — Z87820 Personal history of traumatic brain injury: Secondary | ICD-10-CM | POA: Diagnosis not present

## 2023-11-11 DIAGNOSIS — I6202 Nontraumatic subacute subdural hemorrhage: Secondary | ICD-10-CM | POA: Diagnosis not present

## 2023-11-11 DIAGNOSIS — Z1152 Encounter for screening for COVID-19: Secondary | ICD-10-CM | POA: Diagnosis not present

## 2023-11-11 DIAGNOSIS — R413 Other amnesia: Secondary | ICD-10-CM | POA: Diagnosis not present

## 2023-11-11 DIAGNOSIS — Z96653 Presence of artificial knee joint, bilateral: Secondary | ICD-10-CM | POA: Diagnosis not present

## 2023-11-11 DIAGNOSIS — E872 Acidosis, unspecified: Secondary | ICD-10-CM | POA: Diagnosis not present

## 2023-11-11 DIAGNOSIS — Z86718 Personal history of other venous thrombosis and embolism: Secondary | ICD-10-CM | POA: Diagnosis not present

## 2023-11-14 DIAGNOSIS — E860 Dehydration: Secondary | ICD-10-CM | POA: Diagnosis not present

## 2023-11-14 DIAGNOSIS — I6200 Nontraumatic subdural hemorrhage, unspecified: Secondary | ICD-10-CM | POA: Diagnosis not present

## 2023-11-14 DIAGNOSIS — I959 Hypotension, unspecified: Secondary | ICD-10-CM | POA: Diagnosis not present

## 2023-11-14 DIAGNOSIS — I4891 Unspecified atrial fibrillation: Secondary | ICD-10-CM | POA: Diagnosis not present

## 2023-11-19 DIAGNOSIS — N289 Disorder of kidney and ureter, unspecified: Secondary | ICD-10-CM | POA: Diagnosis not present

## 2023-11-19 DIAGNOSIS — I4891 Unspecified atrial fibrillation: Secondary | ICD-10-CM | POA: Diagnosis not present

## 2023-11-19 DIAGNOSIS — I2699 Other pulmonary embolism without acute cor pulmonale: Secondary | ICD-10-CM | POA: Diagnosis not present

## 2023-11-19 DIAGNOSIS — S065XAA Traumatic subdural hemorrhage with loss of consciousness status unknown, initial encounter: Secondary | ICD-10-CM | POA: Diagnosis not present

## 2023-11-19 DIAGNOSIS — I10 Essential (primary) hypertension: Secondary | ICD-10-CM | POA: Diagnosis not present

## 2023-11-19 DIAGNOSIS — S2231XA Fracture of one rib, right side, initial encounter for closed fracture: Secondary | ICD-10-CM | POA: Diagnosis not present

## 2023-11-19 DIAGNOSIS — R1310 Dysphagia, unspecified: Secondary | ICD-10-CM | POA: Diagnosis not present

## 2023-11-21 DIAGNOSIS — I10 Essential (primary) hypertension: Secondary | ICD-10-CM | POA: Diagnosis not present

## 2023-11-21 DIAGNOSIS — I4891 Unspecified atrial fibrillation: Secondary | ICD-10-CM | POA: Diagnosis not present

## 2023-11-28 DIAGNOSIS — Z7189 Other specified counseling: Secondary | ICD-10-CM | POA: Diagnosis not present

## 2023-11-28 DIAGNOSIS — I4891 Unspecified atrial fibrillation: Secondary | ICD-10-CM | POA: Diagnosis not present

## 2023-11-28 DIAGNOSIS — S065XAA Traumatic subdural hemorrhage with loss of consciousness status unknown, initial encounter: Secondary | ICD-10-CM | POA: Diagnosis not present

## 2023-11-30 DIAGNOSIS — I2699 Other pulmonary embolism without acute cor pulmonale: Secondary | ICD-10-CM | POA: Diagnosis not present

## 2023-11-30 DIAGNOSIS — R5381 Other malaise: Secondary | ICD-10-CM | POA: Diagnosis not present

## 2023-11-30 DIAGNOSIS — S065XAA Traumatic subdural hemorrhage with loss of consciousness status unknown, initial encounter: Secondary | ICD-10-CM | POA: Diagnosis not present

## 2023-11-30 DIAGNOSIS — I4891 Unspecified atrial fibrillation: Secondary | ICD-10-CM | POA: Diagnosis not present

## 2023-12-02 DIAGNOSIS — I2699 Other pulmonary embolism without acute cor pulmonale: Secondary | ICD-10-CM | POA: Diagnosis not present

## 2023-12-02 DIAGNOSIS — I4891 Unspecified atrial fibrillation: Secondary | ICD-10-CM | POA: Diagnosis not present

## 2023-12-02 DIAGNOSIS — S065XAA Traumatic subdural hemorrhage with loss of consciousness status unknown, initial encounter: Secondary | ICD-10-CM | POA: Diagnosis not present

## 2023-12-03 DIAGNOSIS — I4891 Unspecified atrial fibrillation: Secondary | ICD-10-CM | POA: Diagnosis not present

## 2023-12-03 DIAGNOSIS — S065XAA Traumatic subdural hemorrhage with loss of consciousness status unknown, initial encounter: Secondary | ICD-10-CM | POA: Diagnosis not present

## 2023-12-03 DIAGNOSIS — I10 Essential (primary) hypertension: Secondary | ICD-10-CM | POA: Diagnosis not present

## 2023-12-03 DIAGNOSIS — I2699 Other pulmonary embolism without acute cor pulmonale: Secondary | ICD-10-CM | POA: Diagnosis not present

## 2023-12-10 DIAGNOSIS — Z79899 Other long term (current) drug therapy: Secondary | ICD-10-CM | POA: Diagnosis not present

## 2023-12-14 DIAGNOSIS — R5381 Other malaise: Secondary | ICD-10-CM | POA: Diagnosis not present

## 2023-12-14 DIAGNOSIS — S065XAA Traumatic subdural hemorrhage with loss of consciousness status unknown, initial encounter: Secondary | ICD-10-CM | POA: Diagnosis not present

## 2023-12-14 DIAGNOSIS — I4891 Unspecified atrial fibrillation: Secondary | ICD-10-CM | POA: Diagnosis not present

## 2023-12-14 DIAGNOSIS — I10 Essential (primary) hypertension: Secondary | ICD-10-CM | POA: Diagnosis not present

## 2023-12-15 DIAGNOSIS — R0902 Hypoxemia: Secondary | ICD-10-CM | POA: Diagnosis not present

## 2023-12-16 DIAGNOSIS — I4891 Unspecified atrial fibrillation: Secondary | ICD-10-CM | POA: Diagnosis not present

## 2023-12-16 DIAGNOSIS — J9601 Acute respiratory failure with hypoxia: Secondary | ICD-10-CM | POA: Diagnosis not present

## 2023-12-17 DIAGNOSIS — I4891 Unspecified atrial fibrillation: Secondary | ICD-10-CM | POA: Diagnosis not present

## 2023-12-17 DIAGNOSIS — I2699 Other pulmonary embolism without acute cor pulmonale: Secondary | ICD-10-CM | POA: Diagnosis not present

## 2023-12-18 DIAGNOSIS — I619 Nontraumatic intracerebral hemorrhage, unspecified: Secondary | ICD-10-CM | POA: Diagnosis not present

## 2023-12-18 DIAGNOSIS — K5900 Constipation, unspecified: Secondary | ICD-10-CM | POA: Diagnosis not present

## 2023-12-18 DIAGNOSIS — M8000XD Age-related osteoporosis with current pathological fracture, unspecified site, subsequent encounter for fracture with routine healing: Secondary | ICD-10-CM | POA: Diagnosis not present

## 2023-12-18 DIAGNOSIS — R0902 Hypoxemia: Secondary | ICD-10-CM | POA: Diagnosis not present

## 2023-12-18 DIAGNOSIS — R1310 Dysphagia, unspecified: Secondary | ICD-10-CM | POA: Diagnosis not present

## 2023-12-18 DIAGNOSIS — J9601 Acute respiratory failure with hypoxia: Secondary | ICD-10-CM | POA: Diagnosis not present

## 2023-12-18 DIAGNOSIS — I2699 Other pulmonary embolism without acute cor pulmonale: Secondary | ICD-10-CM | POA: Diagnosis not present

## 2023-12-18 DIAGNOSIS — R0602 Shortness of breath: Secondary | ICD-10-CM | POA: Diagnosis not present

## 2023-12-18 DIAGNOSIS — I4891 Unspecified atrial fibrillation: Secondary | ICD-10-CM | POA: Diagnosis not present

## 2023-12-18 DIAGNOSIS — M6281 Muscle weakness (generalized): Secondary | ICD-10-CM | POA: Diagnosis not present

## 2023-12-18 DIAGNOSIS — G459 Transient cerebral ischemic attack, unspecified: Secondary | ICD-10-CM | POA: Diagnosis not present

## 2023-12-18 DIAGNOSIS — G9009 Other idiopathic peripheral autonomic neuropathy: Secondary | ICD-10-CM | POA: Diagnosis not present

## 2023-12-18 DIAGNOSIS — I69320 Aphasia following cerebral infarction: Secondary | ICD-10-CM | POA: Diagnosis not present

## 2023-12-18 DIAGNOSIS — R4701 Aphasia: Secondary | ICD-10-CM | POA: Diagnosis not present

## 2023-12-18 DIAGNOSIS — Z66 Do not resuscitate: Secondary | ICD-10-CM | POA: Diagnosis not present

## 2023-12-18 DIAGNOSIS — I480 Paroxysmal atrial fibrillation: Secondary | ICD-10-CM | POA: Diagnosis not present

## 2023-12-18 DIAGNOSIS — G9341 Metabolic encephalopathy: Secondary | ICD-10-CM | POA: Diagnosis not present

## 2023-12-18 DIAGNOSIS — G4700 Insomnia, unspecified: Secondary | ICD-10-CM | POA: Diagnosis not present

## 2023-12-18 DIAGNOSIS — Z86718 Personal history of other venous thrombosis and embolism: Secondary | ICD-10-CM | POA: Diagnosis not present

## 2023-12-18 DIAGNOSIS — I82401 Acute embolism and thrombosis of unspecified deep veins of right lower extremity: Secondary | ICD-10-CM | POA: Diagnosis not present

## 2023-12-18 DIAGNOSIS — E876 Hypokalemia: Secondary | ICD-10-CM | POA: Diagnosis not present

## 2023-12-18 DIAGNOSIS — I6200 Nontraumatic subdural hemorrhage, unspecified: Secondary | ICD-10-CM | POA: Diagnosis not present

## 2023-12-18 DIAGNOSIS — I499 Cardiac arrhythmia, unspecified: Secondary | ICD-10-CM | POA: Diagnosis not present

## 2023-12-18 DIAGNOSIS — R633 Feeding difficulties, unspecified: Secondary | ICD-10-CM | POA: Diagnosis not present

## 2023-12-18 DIAGNOSIS — E568 Deficiency of other vitamins: Secondary | ICD-10-CM | POA: Diagnosis not present

## 2023-12-18 DIAGNOSIS — Z86711 Personal history of pulmonary embolism: Secondary | ICD-10-CM | POA: Diagnosis not present

## 2023-12-18 DIAGNOSIS — Z7901 Long term (current) use of anticoagulants: Secondary | ICD-10-CM | POA: Diagnosis not present

## 2023-12-18 DIAGNOSIS — I2602 Saddle embolus of pulmonary artery with acute cor pulmonale: Secondary | ICD-10-CM | POA: Diagnosis not present

## 2023-12-18 DIAGNOSIS — I10 Essential (primary) hypertension: Secondary | ICD-10-CM | POA: Diagnosis not present

## 2023-12-18 DIAGNOSIS — I82402 Acute embolism and thrombosis of unspecified deep veins of left lower extremity: Secondary | ICD-10-CM | POA: Diagnosis not present

## 2023-12-18 DIAGNOSIS — L97509 Non-pressure chronic ulcer of other part of unspecified foot with unspecified severity: Secondary | ICD-10-CM | POA: Diagnosis not present

## 2023-12-18 DIAGNOSIS — R0600 Dyspnea, unspecified: Secondary | ICD-10-CM | POA: Diagnosis not present

## 2023-12-18 DIAGNOSIS — R1013 Epigastric pain: Secondary | ICD-10-CM | POA: Diagnosis not present

## 2023-12-18 DIAGNOSIS — I6201 Nontraumatic acute subdural hemorrhage: Secondary | ICD-10-CM | POA: Diagnosis not present

## 2023-12-18 DIAGNOSIS — R410 Disorientation, unspecified: Secondary | ICD-10-CM | POA: Diagnosis not present

## 2023-12-18 DIAGNOSIS — R000 Tachycardia, unspecified: Secondary | ICD-10-CM | POA: Diagnosis not present

## 2023-12-28 DIAGNOSIS — R1310 Dysphagia, unspecified: Secondary | ICD-10-CM | POA: Diagnosis not present

## 2023-12-28 DIAGNOSIS — E876 Hypokalemia: Secondary | ICD-10-CM | POA: Diagnosis not present

## 2023-12-28 DIAGNOSIS — I2699 Other pulmonary embolism without acute cor pulmonale: Secondary | ICD-10-CM | POA: Diagnosis not present

## 2023-12-28 DIAGNOSIS — I480 Paroxysmal atrial fibrillation: Secondary | ICD-10-CM | POA: Diagnosis not present

## 2023-12-28 DIAGNOSIS — G9341 Metabolic encephalopathy: Secondary | ICD-10-CM | POA: Diagnosis not present

## 2023-12-28 DIAGNOSIS — K5900 Constipation, unspecified: Secondary | ICD-10-CM | POA: Diagnosis not present

## 2023-12-28 DIAGNOSIS — R4701 Aphasia: Secondary | ICD-10-CM | POA: Diagnosis not present

## 2023-12-28 DIAGNOSIS — G4700 Insomnia, unspecified: Secondary | ICD-10-CM | POA: Diagnosis not present

## 2024-01-03 DIAGNOSIS — G4700 Insomnia, unspecified: Secondary | ICD-10-CM | POA: Diagnosis not present

## 2024-01-03 DIAGNOSIS — I2699 Other pulmonary embolism without acute cor pulmonale: Secondary | ICD-10-CM | POA: Diagnosis not present

## 2024-01-03 DIAGNOSIS — R4701 Aphasia: Secondary | ICD-10-CM | POA: Diagnosis not present

## 2024-01-03 DIAGNOSIS — E876 Hypokalemia: Secondary | ICD-10-CM | POA: Diagnosis not present

## 2024-01-03 DIAGNOSIS — G9341 Metabolic encephalopathy: Secondary | ICD-10-CM | POA: Diagnosis not present

## 2024-01-03 DIAGNOSIS — K5900 Constipation, unspecified: Secondary | ICD-10-CM | POA: Diagnosis not present

## 2024-01-03 DIAGNOSIS — R1310 Dysphagia, unspecified: Secondary | ICD-10-CM | POA: Diagnosis not present

## 2024-01-03 DIAGNOSIS — I480 Paroxysmal atrial fibrillation: Secondary | ICD-10-CM | POA: Diagnosis not present

## 2024-01-04 DIAGNOSIS — I959 Hypotension, unspecified: Secondary | ICD-10-CM | POA: Diagnosis not present

## 2024-01-04 DIAGNOSIS — R531 Weakness: Secondary | ICD-10-CM | POA: Diagnosis not present

## 2024-01-04 DIAGNOSIS — I10 Essential (primary) hypertension: Secondary | ICD-10-CM | POA: Diagnosis not present

## 2024-01-04 DIAGNOSIS — R4701 Aphasia: Secondary | ICD-10-CM | POA: Diagnosis not present

## 2024-01-04 DIAGNOSIS — I480 Paroxysmal atrial fibrillation: Secondary | ICD-10-CM | POA: Diagnosis not present

## 2024-01-04 DIAGNOSIS — R059 Cough, unspecified: Secondary | ICD-10-CM | POA: Diagnosis not present

## 2024-01-04 DIAGNOSIS — I2699 Other pulmonary embolism without acute cor pulmonale: Secondary | ICD-10-CM | POA: Diagnosis not present

## 2024-01-04 DIAGNOSIS — G9009 Other idiopathic peripheral autonomic neuropathy: Secondary | ICD-10-CM | POA: Diagnosis not present

## 2024-01-04 DIAGNOSIS — R1310 Dysphagia, unspecified: Secondary | ICD-10-CM | POA: Diagnosis not present

## 2024-01-04 DIAGNOSIS — M6281 Muscle weakness (generalized): Secondary | ICD-10-CM | POA: Diagnosis not present

## 2024-01-04 DIAGNOSIS — I4891 Unspecified atrial fibrillation: Secondary | ICD-10-CM | POA: Diagnosis not present

## 2024-01-04 DIAGNOSIS — J449 Chronic obstructive pulmonary disease, unspecified: Secondary | ICD-10-CM | POA: Diagnosis not present

## 2024-01-04 DIAGNOSIS — E568 Deficiency of other vitamins: Secondary | ICD-10-CM | POA: Diagnosis not present

## 2024-01-04 DIAGNOSIS — E876 Hypokalemia: Secondary | ICD-10-CM | POA: Diagnosis not present

## 2024-01-04 DIAGNOSIS — G459 Transient cerebral ischemic attack, unspecified: Secondary | ICD-10-CM | POA: Diagnosis not present

## 2024-01-04 DIAGNOSIS — K5900 Constipation, unspecified: Secondary | ICD-10-CM | POA: Diagnosis not present

## 2024-01-04 DIAGNOSIS — I517 Cardiomegaly: Secondary | ICD-10-CM | POA: Diagnosis not present

## 2024-01-04 DIAGNOSIS — I482 Chronic atrial fibrillation, unspecified: Secondary | ICD-10-CM | POA: Diagnosis not present

## 2024-01-04 DIAGNOSIS — I2602 Saddle embolus of pulmonary artery with acute cor pulmonale: Secondary | ICD-10-CM | POA: Diagnosis not present

## 2024-01-04 DIAGNOSIS — R410 Disorientation, unspecified: Secondary | ICD-10-CM | POA: Diagnosis not present

## 2024-01-04 DIAGNOSIS — Z9181 History of falling: Secondary | ICD-10-CM | POA: Diagnosis not present

## 2024-01-04 DIAGNOSIS — I6201 Nontraumatic acute subdural hemorrhage: Secondary | ICD-10-CM | POA: Diagnosis not present

## 2024-01-04 DIAGNOSIS — I6200 Nontraumatic subdural hemorrhage, unspecified: Secondary | ICD-10-CM | POA: Diagnosis not present

## 2024-01-04 DIAGNOSIS — M8000XD Age-related osteoporosis with current pathological fracture, unspecified site, subsequent encounter for fracture with routine healing: Secondary | ICD-10-CM | POA: Diagnosis not present

## 2024-01-04 DIAGNOSIS — I509 Heart failure, unspecified: Secondary | ICD-10-CM | POA: Diagnosis not present

## 2024-01-04 DIAGNOSIS — N390 Urinary tract infection, site not specified: Secondary | ICD-10-CM | POA: Diagnosis not present

## 2024-01-04 DIAGNOSIS — G4700 Insomnia, unspecified: Secondary | ICD-10-CM | POA: Diagnosis not present

## 2024-01-04 DIAGNOSIS — G9341 Metabolic encephalopathy: Secondary | ICD-10-CM | POA: Diagnosis not present

## 2024-01-04 DIAGNOSIS — I69320 Aphasia following cerebral infarction: Secondary | ICD-10-CM | POA: Diagnosis not present

## 2024-01-08 DIAGNOSIS — I959 Hypotension, unspecified: Secondary | ICD-10-CM | POA: Diagnosis not present

## 2024-01-08 DIAGNOSIS — M6281 Muscle weakness (generalized): Secondary | ICD-10-CM | POA: Diagnosis not present

## 2024-01-08 DIAGNOSIS — Z9181 History of falling: Secondary | ICD-10-CM | POA: Diagnosis not present

## 2024-01-08 DIAGNOSIS — I10 Essential (primary) hypertension: Secondary | ICD-10-CM | POA: Diagnosis not present

## 2024-01-08 DIAGNOSIS — G4700 Insomnia, unspecified: Secondary | ICD-10-CM | POA: Diagnosis not present

## 2024-01-08 DIAGNOSIS — I4891 Unspecified atrial fibrillation: Secondary | ICD-10-CM | POA: Diagnosis not present

## 2024-01-09 DIAGNOSIS — I4891 Unspecified atrial fibrillation: Secondary | ICD-10-CM | POA: Diagnosis not present

## 2024-01-09 DIAGNOSIS — I10 Essential (primary) hypertension: Secondary | ICD-10-CM | POA: Diagnosis not present

## 2024-01-09 DIAGNOSIS — G4700 Insomnia, unspecified: Secondary | ICD-10-CM | POA: Diagnosis not present

## 2024-01-09 DIAGNOSIS — Z9181 History of falling: Secondary | ICD-10-CM | POA: Diagnosis not present

## 2024-01-09 DIAGNOSIS — R531 Weakness: Secondary | ICD-10-CM | POA: Diagnosis not present

## 2024-01-15 DIAGNOSIS — I4891 Unspecified atrial fibrillation: Secondary | ICD-10-CM | POA: Diagnosis not present

## 2024-01-15 DIAGNOSIS — I10 Essential (primary) hypertension: Secondary | ICD-10-CM | POA: Diagnosis not present

## 2024-01-15 DIAGNOSIS — I6201 Nontraumatic acute subdural hemorrhage: Secondary | ICD-10-CM | POA: Diagnosis not present

## 2024-01-16 DIAGNOSIS — I4891 Unspecified atrial fibrillation: Secondary | ICD-10-CM | POA: Diagnosis not present

## 2024-01-16 DIAGNOSIS — I10 Essential (primary) hypertension: Secondary | ICD-10-CM | POA: Diagnosis not present

## 2024-01-16 DIAGNOSIS — G4700 Insomnia, unspecified: Secondary | ICD-10-CM | POA: Diagnosis not present

## 2024-01-16 DIAGNOSIS — I509 Heart failure, unspecified: Secondary | ICD-10-CM | POA: Diagnosis not present

## 2024-01-21 DIAGNOSIS — I4891 Unspecified atrial fibrillation: Secondary | ICD-10-CM | POA: Diagnosis not present

## 2024-01-21 DIAGNOSIS — I2602 Saddle embolus of pulmonary artery with acute cor pulmonale: Secondary | ICD-10-CM | POA: Diagnosis not present

## 2024-01-21 DIAGNOSIS — I10 Essential (primary) hypertension: Secondary | ICD-10-CM | POA: Diagnosis not present

## 2024-01-22 DIAGNOSIS — J449 Chronic obstructive pulmonary disease, unspecified: Secondary | ICD-10-CM | POA: Diagnosis not present

## 2024-01-22 DIAGNOSIS — I2602 Saddle embolus of pulmonary artery with acute cor pulmonale: Secondary | ICD-10-CM | POA: Diagnosis not present

## 2024-01-22 DIAGNOSIS — I509 Heart failure, unspecified: Secondary | ICD-10-CM | POA: Diagnosis not present

## 2024-01-22 DIAGNOSIS — I4891 Unspecified atrial fibrillation: Secondary | ICD-10-CM | POA: Diagnosis not present

## 2024-01-23 DIAGNOSIS — I10 Essential (primary) hypertension: Secondary | ICD-10-CM | POA: Diagnosis not present

## 2024-01-23 DIAGNOSIS — J449 Chronic obstructive pulmonary disease, unspecified: Secondary | ICD-10-CM | POA: Diagnosis not present

## 2024-01-23 DIAGNOSIS — I509 Heart failure, unspecified: Secondary | ICD-10-CM | POA: Diagnosis not present

## 2024-01-23 DIAGNOSIS — I2602 Saddle embolus of pulmonary artery with acute cor pulmonale: Secondary | ICD-10-CM | POA: Diagnosis not present

## 2024-01-23 DIAGNOSIS — I4891 Unspecified atrial fibrillation: Secondary | ICD-10-CM | POA: Diagnosis not present

## 2024-01-29 DIAGNOSIS — R059 Cough, unspecified: Secondary | ICD-10-CM | POA: Diagnosis not present

## 2024-01-29 DIAGNOSIS — I10 Essential (primary) hypertension: Secondary | ICD-10-CM | POA: Diagnosis not present

## 2024-01-29 DIAGNOSIS — I509 Heart failure, unspecified: Secondary | ICD-10-CM | POA: Diagnosis not present

## 2024-01-29 DIAGNOSIS — I4891 Unspecified atrial fibrillation: Secondary | ICD-10-CM | POA: Diagnosis not present

## 2024-01-30 DIAGNOSIS — I4891 Unspecified atrial fibrillation: Secondary | ICD-10-CM | POA: Diagnosis not present

## 2024-01-30 DIAGNOSIS — I6200 Nontraumatic subdural hemorrhage, unspecified: Secondary | ICD-10-CM | POA: Diagnosis not present

## 2024-01-30 DIAGNOSIS — I509 Heart failure, unspecified: Secondary | ICD-10-CM | POA: Diagnosis not present

## 2024-01-30 DIAGNOSIS — R059 Cough, unspecified: Secondary | ICD-10-CM | POA: Diagnosis not present

## 2024-02-05 DIAGNOSIS — J449 Chronic obstructive pulmonary disease, unspecified: Secondary | ICD-10-CM | POA: Diagnosis not present

## 2024-02-05 DIAGNOSIS — N390 Urinary tract infection, site not specified: Secondary | ICD-10-CM | POA: Diagnosis not present

## 2024-02-05 DIAGNOSIS — I509 Heart failure, unspecified: Secondary | ICD-10-CM | POA: Diagnosis not present

## 2024-02-06 DIAGNOSIS — I10 Essential (primary) hypertension: Secondary | ICD-10-CM | POA: Diagnosis not present

## 2024-02-06 DIAGNOSIS — G4700 Insomnia, unspecified: Secondary | ICD-10-CM | POA: Diagnosis not present

## 2024-02-06 DIAGNOSIS — J449 Chronic obstructive pulmonary disease, unspecified: Secondary | ICD-10-CM | POA: Diagnosis not present

## 2024-02-13 DIAGNOSIS — I482 Chronic atrial fibrillation, unspecified: Secondary | ICD-10-CM | POA: Diagnosis not present

## 2024-02-13 DIAGNOSIS — J449 Chronic obstructive pulmonary disease, unspecified: Secondary | ICD-10-CM | POA: Diagnosis not present

## 2024-02-13 DIAGNOSIS — I509 Heart failure, unspecified: Secondary | ICD-10-CM | POA: Diagnosis not present

## 2024-02-13 DIAGNOSIS — G4700 Insomnia, unspecified: Secondary | ICD-10-CM | POA: Diagnosis not present

## 2024-02-13 DIAGNOSIS — I10 Essential (primary) hypertension: Secondary | ICD-10-CM | POA: Diagnosis not present

## 2024-02-22 DIAGNOSIS — R2681 Unsteadiness on feet: Secondary | ICD-10-CM | POA: Diagnosis not present

## 2024-02-22 DIAGNOSIS — I2602 Saddle embolus of pulmonary artery with acute cor pulmonale: Secondary | ICD-10-CM | POA: Diagnosis not present

## 2024-02-22 DIAGNOSIS — R278 Other lack of coordination: Secondary | ICD-10-CM | POA: Diagnosis not present

## 2024-02-22 DIAGNOSIS — M6281 Muscle weakness (generalized): Secondary | ICD-10-CM | POA: Diagnosis not present

## 2024-02-22 DIAGNOSIS — G9341 Metabolic encephalopathy: Secondary | ICD-10-CM | POA: Diagnosis not present

## 2024-02-23 DIAGNOSIS — I2602 Saddle embolus of pulmonary artery with acute cor pulmonale: Secondary | ICD-10-CM | POA: Diagnosis not present

## 2024-02-23 DIAGNOSIS — R278 Other lack of coordination: Secondary | ICD-10-CM | POA: Diagnosis not present

## 2024-02-23 DIAGNOSIS — R2681 Unsteadiness on feet: Secondary | ICD-10-CM | POA: Diagnosis not present

## 2024-02-23 DIAGNOSIS — M6281 Muscle weakness (generalized): Secondary | ICD-10-CM | POA: Diagnosis not present

## 2024-02-23 DIAGNOSIS — G9341 Metabolic encephalopathy: Secondary | ICD-10-CM | POA: Diagnosis not present

## 2024-02-25 DIAGNOSIS — M6281 Muscle weakness (generalized): Secondary | ICD-10-CM | POA: Diagnosis not present

## 2024-02-25 DIAGNOSIS — R2681 Unsteadiness on feet: Secondary | ICD-10-CM | POA: Diagnosis not present

## 2024-02-25 DIAGNOSIS — R278 Other lack of coordination: Secondary | ICD-10-CM | POA: Diagnosis not present

## 2024-02-25 DIAGNOSIS — I2602 Saddle embolus of pulmonary artery with acute cor pulmonale: Secondary | ICD-10-CM | POA: Diagnosis not present

## 2024-02-25 DIAGNOSIS — G9341 Metabolic encephalopathy: Secondary | ICD-10-CM | POA: Diagnosis not present

## 2024-02-26 DIAGNOSIS — G9341 Metabolic encephalopathy: Secondary | ICD-10-CM | POA: Diagnosis not present

## 2024-02-26 DIAGNOSIS — R2681 Unsteadiness on feet: Secondary | ICD-10-CM | POA: Diagnosis not present

## 2024-02-26 DIAGNOSIS — R278 Other lack of coordination: Secondary | ICD-10-CM | POA: Diagnosis not present

## 2024-02-26 DIAGNOSIS — I2602 Saddle embolus of pulmonary artery with acute cor pulmonale: Secondary | ICD-10-CM | POA: Diagnosis not present

## 2024-02-26 DIAGNOSIS — M6281 Muscle weakness (generalized): Secondary | ICD-10-CM | POA: Diagnosis not present

## 2024-02-27 DIAGNOSIS — M6281 Muscle weakness (generalized): Secondary | ICD-10-CM | POA: Diagnosis not present

## 2024-02-27 DIAGNOSIS — I2602 Saddle embolus of pulmonary artery with acute cor pulmonale: Secondary | ICD-10-CM | POA: Diagnosis not present

## 2024-02-27 DIAGNOSIS — R2681 Unsteadiness on feet: Secondary | ICD-10-CM | POA: Diagnosis not present

## 2024-02-27 DIAGNOSIS — R278 Other lack of coordination: Secondary | ICD-10-CM | POA: Diagnosis not present

## 2024-02-27 DIAGNOSIS — G9341 Metabolic encephalopathy: Secondary | ICD-10-CM | POA: Diagnosis not present

## 2024-02-28 DIAGNOSIS — M6281 Muscle weakness (generalized): Secondary | ICD-10-CM | POA: Diagnosis not present

## 2024-02-28 DIAGNOSIS — G9341 Metabolic encephalopathy: Secondary | ICD-10-CM | POA: Diagnosis not present

## 2024-02-28 DIAGNOSIS — R278 Other lack of coordination: Secondary | ICD-10-CM | POA: Diagnosis not present

## 2024-02-28 DIAGNOSIS — R2681 Unsteadiness on feet: Secondary | ICD-10-CM | POA: Diagnosis not present

## 2024-02-28 DIAGNOSIS — I2602 Saddle embolus of pulmonary artery with acute cor pulmonale: Secondary | ICD-10-CM | POA: Diagnosis not present

## 2024-02-29 DIAGNOSIS — R278 Other lack of coordination: Secondary | ICD-10-CM | POA: Diagnosis not present

## 2024-02-29 DIAGNOSIS — G9341 Metabolic encephalopathy: Secondary | ICD-10-CM | POA: Diagnosis not present

## 2024-02-29 DIAGNOSIS — I2602 Saddle embolus of pulmonary artery with acute cor pulmonale: Secondary | ICD-10-CM | POA: Diagnosis not present

## 2024-02-29 DIAGNOSIS — R2681 Unsteadiness on feet: Secondary | ICD-10-CM | POA: Diagnosis not present

## 2024-02-29 DIAGNOSIS — M6281 Muscle weakness (generalized): Secondary | ICD-10-CM | POA: Diagnosis not present

## 2024-03-01 DIAGNOSIS — R2681 Unsteadiness on feet: Secondary | ICD-10-CM | POA: Diagnosis not present

## 2024-03-01 DIAGNOSIS — I2602 Saddle embolus of pulmonary artery with acute cor pulmonale: Secondary | ICD-10-CM | POA: Diagnosis not present

## 2024-03-01 DIAGNOSIS — G9341 Metabolic encephalopathy: Secondary | ICD-10-CM | POA: Diagnosis not present

## 2024-03-01 DIAGNOSIS — R278 Other lack of coordination: Secondary | ICD-10-CM | POA: Diagnosis not present

## 2024-03-01 DIAGNOSIS — M6281 Muscle weakness (generalized): Secondary | ICD-10-CM | POA: Diagnosis not present

## 2024-03-03 DIAGNOSIS — R278 Other lack of coordination: Secondary | ICD-10-CM | POA: Diagnosis not present

## 2024-03-03 DIAGNOSIS — R2681 Unsteadiness on feet: Secondary | ICD-10-CM | POA: Diagnosis not present

## 2024-03-03 DIAGNOSIS — G9341 Metabolic encephalopathy: Secondary | ICD-10-CM | POA: Diagnosis not present

## 2024-03-03 DIAGNOSIS — I2602 Saddle embolus of pulmonary artery with acute cor pulmonale: Secondary | ICD-10-CM | POA: Diagnosis not present

## 2024-03-03 DIAGNOSIS — M6281 Muscle weakness (generalized): Secondary | ICD-10-CM | POA: Diagnosis not present

## 2024-03-04 DIAGNOSIS — G9341 Metabolic encephalopathy: Secondary | ICD-10-CM | POA: Diagnosis not present

## 2024-03-04 DIAGNOSIS — M6281 Muscle weakness (generalized): Secondary | ICD-10-CM | POA: Diagnosis not present

## 2024-03-04 DIAGNOSIS — R278 Other lack of coordination: Secondary | ICD-10-CM | POA: Diagnosis not present

## 2024-03-04 DIAGNOSIS — R2681 Unsteadiness on feet: Secondary | ICD-10-CM | POA: Diagnosis not present

## 2024-03-04 DIAGNOSIS — I2602 Saddle embolus of pulmonary artery with acute cor pulmonale: Secondary | ICD-10-CM | POA: Diagnosis not present

## 2024-03-05 DIAGNOSIS — R2681 Unsteadiness on feet: Secondary | ICD-10-CM | POA: Diagnosis not present

## 2024-03-05 DIAGNOSIS — M6281 Muscle weakness (generalized): Secondary | ICD-10-CM | POA: Diagnosis not present

## 2024-03-05 DIAGNOSIS — G9341 Metabolic encephalopathy: Secondary | ICD-10-CM | POA: Diagnosis not present

## 2024-03-05 DIAGNOSIS — I2602 Saddle embolus of pulmonary artery with acute cor pulmonale: Secondary | ICD-10-CM | POA: Diagnosis not present

## 2024-03-05 DIAGNOSIS — R278 Other lack of coordination: Secondary | ICD-10-CM | POA: Diagnosis not present

## 2024-03-06 DIAGNOSIS — M6281 Muscle weakness (generalized): Secondary | ICD-10-CM | POA: Diagnosis not present

## 2024-03-06 DIAGNOSIS — R2681 Unsteadiness on feet: Secondary | ICD-10-CM | POA: Diagnosis not present

## 2024-03-06 DIAGNOSIS — R278 Other lack of coordination: Secondary | ICD-10-CM | POA: Diagnosis not present

## 2024-03-06 DIAGNOSIS — I2602 Saddle embolus of pulmonary artery with acute cor pulmonale: Secondary | ICD-10-CM | POA: Diagnosis not present

## 2024-03-06 DIAGNOSIS — G9341 Metabolic encephalopathy: Secondary | ICD-10-CM | POA: Diagnosis not present

## 2024-03-07 DIAGNOSIS — M6281 Muscle weakness (generalized): Secondary | ICD-10-CM | POA: Diagnosis not present

## 2024-03-07 DIAGNOSIS — R278 Other lack of coordination: Secondary | ICD-10-CM | POA: Diagnosis not present

## 2024-03-07 DIAGNOSIS — G9341 Metabolic encephalopathy: Secondary | ICD-10-CM | POA: Diagnosis not present

## 2024-03-07 DIAGNOSIS — R2681 Unsteadiness on feet: Secondary | ICD-10-CM | POA: Diagnosis not present

## 2024-03-07 DIAGNOSIS — G4733 Obstructive sleep apnea (adult) (pediatric): Secondary | ICD-10-CM | POA: Diagnosis not present

## 2024-03-07 DIAGNOSIS — K5900 Constipation, unspecified: Secondary | ICD-10-CM | POA: Diagnosis not present

## 2024-03-07 DIAGNOSIS — I2602 Saddle embolus of pulmonary artery with acute cor pulmonale: Secondary | ICD-10-CM | POA: Diagnosis not present

## 2024-03-07 DIAGNOSIS — I480 Paroxysmal atrial fibrillation: Secondary | ICD-10-CM | POA: Diagnosis not present

## 2024-03-07 DIAGNOSIS — I5022 Chronic systolic (congestive) heart failure: Secondary | ICD-10-CM | POA: Diagnosis not present

## 2024-03-10 DIAGNOSIS — I2602 Saddle embolus of pulmonary artery with acute cor pulmonale: Secondary | ICD-10-CM | POA: Diagnosis not present

## 2024-03-10 DIAGNOSIS — R2681 Unsteadiness on feet: Secondary | ICD-10-CM | POA: Diagnosis not present

## 2024-03-10 DIAGNOSIS — M6281 Muscle weakness (generalized): Secondary | ICD-10-CM | POA: Diagnosis not present

## 2024-03-10 DIAGNOSIS — G9341 Metabolic encephalopathy: Secondary | ICD-10-CM | POA: Diagnosis not present

## 2024-03-10 DIAGNOSIS — R278 Other lack of coordination: Secondary | ICD-10-CM | POA: Diagnosis not present

## 2024-03-11 DIAGNOSIS — I2602 Saddle embolus of pulmonary artery with acute cor pulmonale: Secondary | ICD-10-CM | POA: Diagnosis not present

## 2024-03-11 DIAGNOSIS — R278 Other lack of coordination: Secondary | ICD-10-CM | POA: Diagnosis not present

## 2024-03-11 DIAGNOSIS — M6281 Muscle weakness (generalized): Secondary | ICD-10-CM | POA: Diagnosis not present

## 2024-03-11 DIAGNOSIS — R2681 Unsteadiness on feet: Secondary | ICD-10-CM | POA: Diagnosis not present

## 2024-03-11 DIAGNOSIS — G9341 Metabolic encephalopathy: Secondary | ICD-10-CM | POA: Diagnosis not present

## 2024-03-12 DIAGNOSIS — M6281 Muscle weakness (generalized): Secondary | ICD-10-CM | POA: Diagnosis not present

## 2024-03-12 DIAGNOSIS — G9341 Metabolic encephalopathy: Secondary | ICD-10-CM | POA: Diagnosis not present

## 2024-03-12 DIAGNOSIS — R2681 Unsteadiness on feet: Secondary | ICD-10-CM | POA: Diagnosis not present

## 2024-03-12 DIAGNOSIS — I2602 Saddle embolus of pulmonary artery with acute cor pulmonale: Secondary | ICD-10-CM | POA: Diagnosis not present

## 2024-03-12 DIAGNOSIS — R278 Other lack of coordination: Secondary | ICD-10-CM | POA: Diagnosis not present

## 2024-03-13 DIAGNOSIS — R2681 Unsteadiness on feet: Secondary | ICD-10-CM | POA: Diagnosis not present

## 2024-03-13 DIAGNOSIS — I2602 Saddle embolus of pulmonary artery with acute cor pulmonale: Secondary | ICD-10-CM | POA: Diagnosis not present

## 2024-03-13 DIAGNOSIS — R278 Other lack of coordination: Secondary | ICD-10-CM | POA: Diagnosis not present

## 2024-03-13 DIAGNOSIS — I5032 Chronic diastolic (congestive) heart failure: Secondary | ICD-10-CM | POA: Diagnosis not present

## 2024-03-13 DIAGNOSIS — M6281 Muscle weakness (generalized): Secondary | ICD-10-CM | POA: Diagnosis not present

## 2024-03-13 DIAGNOSIS — I739 Peripheral vascular disease, unspecified: Secondary | ICD-10-CM | POA: Diagnosis not present

## 2024-03-13 DIAGNOSIS — G9341 Metabolic encephalopathy: Secondary | ICD-10-CM | POA: Diagnosis not present

## 2024-03-14 DIAGNOSIS — I2602 Saddle embolus of pulmonary artery with acute cor pulmonale: Secondary | ICD-10-CM | POA: Diagnosis not present

## 2024-03-14 DIAGNOSIS — R278 Other lack of coordination: Secondary | ICD-10-CM | POA: Diagnosis not present

## 2024-03-14 DIAGNOSIS — R2681 Unsteadiness on feet: Secondary | ICD-10-CM | POA: Diagnosis not present

## 2024-03-14 DIAGNOSIS — G9341 Metabolic encephalopathy: Secondary | ICD-10-CM | POA: Diagnosis not present

## 2024-03-14 DIAGNOSIS — M6281 Muscle weakness (generalized): Secondary | ICD-10-CM | POA: Diagnosis not present

## 2024-03-15 DIAGNOSIS — R2681 Unsteadiness on feet: Secondary | ICD-10-CM | POA: Diagnosis not present

## 2024-03-15 DIAGNOSIS — R278 Other lack of coordination: Secondary | ICD-10-CM | POA: Diagnosis not present

## 2024-03-15 DIAGNOSIS — G9341 Metabolic encephalopathy: Secondary | ICD-10-CM | POA: Diagnosis not present

## 2024-03-15 DIAGNOSIS — I2602 Saddle embolus of pulmonary artery with acute cor pulmonale: Secondary | ICD-10-CM | POA: Diagnosis not present

## 2024-03-15 DIAGNOSIS — M6281 Muscle weakness (generalized): Secondary | ICD-10-CM | POA: Diagnosis not present

## 2024-03-16 DIAGNOSIS — R278 Other lack of coordination: Secondary | ICD-10-CM | POA: Diagnosis not present

## 2024-03-16 DIAGNOSIS — R2681 Unsteadiness on feet: Secondary | ICD-10-CM | POA: Diagnosis not present

## 2024-03-16 DIAGNOSIS — M6281 Muscle weakness (generalized): Secondary | ICD-10-CM | POA: Diagnosis not present

## 2024-03-16 DIAGNOSIS — G9341 Metabolic encephalopathy: Secondary | ICD-10-CM | POA: Diagnosis not present

## 2024-03-16 DIAGNOSIS — I2602 Saddle embolus of pulmonary artery with acute cor pulmonale: Secondary | ICD-10-CM | POA: Diagnosis not present

## 2024-03-17 DIAGNOSIS — I2602 Saddle embolus of pulmonary artery with acute cor pulmonale: Secondary | ICD-10-CM | POA: Diagnosis not present

## 2024-03-17 DIAGNOSIS — R2681 Unsteadiness on feet: Secondary | ICD-10-CM | POA: Diagnosis not present

## 2024-03-17 DIAGNOSIS — R278 Other lack of coordination: Secondary | ICD-10-CM | POA: Diagnosis not present

## 2024-03-17 DIAGNOSIS — G9341 Metabolic encephalopathy: Secondary | ICD-10-CM | POA: Diagnosis not present

## 2024-03-17 DIAGNOSIS — M6281 Muscle weakness (generalized): Secondary | ICD-10-CM | POA: Diagnosis not present

## 2024-03-18 DIAGNOSIS — Z86711 Personal history of pulmonary embolism: Secondary | ICD-10-CM | POA: Diagnosis not present

## 2024-03-18 DIAGNOSIS — I480 Paroxysmal atrial fibrillation: Secondary | ICD-10-CM | POA: Diagnosis not present

## 2024-03-18 DIAGNOSIS — G9341 Metabolic encephalopathy: Secondary | ICD-10-CM | POA: Diagnosis not present

## 2024-03-18 DIAGNOSIS — R2681 Unsteadiness on feet: Secondary | ICD-10-CM | POA: Diagnosis not present

## 2024-03-18 DIAGNOSIS — J440 Chronic obstructive pulmonary disease with acute lower respiratory infection: Secondary | ICD-10-CM | POA: Diagnosis not present

## 2024-03-18 DIAGNOSIS — M6281 Muscle weakness (generalized): Secondary | ICD-10-CM | POA: Diagnosis not present

## 2024-03-18 DIAGNOSIS — R278 Other lack of coordination: Secondary | ICD-10-CM | POA: Diagnosis not present

## 2024-03-18 DIAGNOSIS — Z8679 Personal history of other diseases of the circulatory system: Secondary | ICD-10-CM | POA: Diagnosis not present

## 2024-03-18 DIAGNOSIS — I2602 Saddle embolus of pulmonary artery with acute cor pulmonale: Secondary | ICD-10-CM | POA: Diagnosis not present

## 2024-03-18 DIAGNOSIS — I10 Essential (primary) hypertension: Secondary | ICD-10-CM | POA: Diagnosis not present

## 2024-03-19 DIAGNOSIS — G9341 Metabolic encephalopathy: Secondary | ICD-10-CM | POA: Diagnosis not present

## 2024-03-19 DIAGNOSIS — I2602 Saddle embolus of pulmonary artery with acute cor pulmonale: Secondary | ICD-10-CM | POA: Diagnosis not present

## 2024-03-19 DIAGNOSIS — R278 Other lack of coordination: Secondary | ICD-10-CM | POA: Diagnosis not present

## 2024-03-19 DIAGNOSIS — R2681 Unsteadiness on feet: Secondary | ICD-10-CM | POA: Diagnosis not present

## 2024-03-19 DIAGNOSIS — M6281 Muscle weakness (generalized): Secondary | ICD-10-CM | POA: Diagnosis not present

## 2024-03-20 DIAGNOSIS — G9341 Metabolic encephalopathy: Secondary | ICD-10-CM | POA: Diagnosis not present

## 2024-03-20 DIAGNOSIS — R2681 Unsteadiness on feet: Secondary | ICD-10-CM | POA: Diagnosis not present

## 2024-03-20 DIAGNOSIS — M6281 Muscle weakness (generalized): Secondary | ICD-10-CM | POA: Diagnosis not present

## 2024-03-20 DIAGNOSIS — I2602 Saddle embolus of pulmonary artery with acute cor pulmonale: Secondary | ICD-10-CM | POA: Diagnosis not present

## 2024-03-20 DIAGNOSIS — R278 Other lack of coordination: Secondary | ICD-10-CM | POA: Diagnosis not present

## 2024-03-21 DIAGNOSIS — G9341 Metabolic encephalopathy: Secondary | ICD-10-CM | POA: Diagnosis not present

## 2024-03-21 DIAGNOSIS — R278 Other lack of coordination: Secondary | ICD-10-CM | POA: Diagnosis not present

## 2024-03-21 DIAGNOSIS — M6281 Muscle weakness (generalized): Secondary | ICD-10-CM | POA: Diagnosis not present

## 2024-03-21 DIAGNOSIS — I2602 Saddle embolus of pulmonary artery with acute cor pulmonale: Secondary | ICD-10-CM | POA: Diagnosis not present

## 2024-03-21 DIAGNOSIS — R2681 Unsteadiness on feet: Secondary | ICD-10-CM | POA: Diagnosis not present

## 2024-03-24 DIAGNOSIS — R278 Other lack of coordination: Secondary | ICD-10-CM | POA: Diagnosis not present

## 2024-03-24 DIAGNOSIS — G9341 Metabolic encephalopathy: Secondary | ICD-10-CM | POA: Diagnosis not present

## 2024-03-24 DIAGNOSIS — M6281 Muscle weakness (generalized): Secondary | ICD-10-CM | POA: Diagnosis not present

## 2024-03-24 DIAGNOSIS — I2602 Saddle embolus of pulmonary artery with acute cor pulmonale: Secondary | ICD-10-CM | POA: Diagnosis not present

## 2024-03-24 DIAGNOSIS — R2681 Unsteadiness on feet: Secondary | ICD-10-CM | POA: Diagnosis not present

## 2024-03-25 DIAGNOSIS — G9341 Metabolic encephalopathy: Secondary | ICD-10-CM | POA: Diagnosis not present

## 2024-03-25 DIAGNOSIS — M2010 Hallux valgus (acquired), unspecified foot: Secondary | ICD-10-CM | POA: Diagnosis not present

## 2024-03-25 DIAGNOSIS — R2681 Unsteadiness on feet: Secondary | ICD-10-CM | POA: Diagnosis not present

## 2024-03-25 DIAGNOSIS — M6281 Muscle weakness (generalized): Secondary | ICD-10-CM | POA: Diagnosis not present

## 2024-03-25 DIAGNOSIS — M2041 Other hammer toe(s) (acquired), right foot: Secondary | ICD-10-CM | POA: Diagnosis not present

## 2024-03-25 DIAGNOSIS — R278 Other lack of coordination: Secondary | ICD-10-CM | POA: Diagnosis not present

## 2024-03-25 DIAGNOSIS — I2602 Saddle embolus of pulmonary artery with acute cor pulmonale: Secondary | ICD-10-CM | POA: Diagnosis not present

## 2024-03-25 DIAGNOSIS — I739 Peripheral vascular disease, unspecified: Secondary | ICD-10-CM | POA: Diagnosis not present

## 2024-03-25 DIAGNOSIS — B351 Tinea unguium: Secondary | ICD-10-CM | POA: Diagnosis not present

## 2024-03-26 DIAGNOSIS — G9341 Metabolic encephalopathy: Secondary | ICD-10-CM | POA: Diagnosis not present

## 2024-03-26 DIAGNOSIS — R2681 Unsteadiness on feet: Secondary | ICD-10-CM | POA: Diagnosis not present

## 2024-03-26 DIAGNOSIS — N390 Urinary tract infection, site not specified: Secondary | ICD-10-CM | POA: Diagnosis not present

## 2024-03-26 DIAGNOSIS — R278 Other lack of coordination: Secondary | ICD-10-CM | POA: Diagnosis not present

## 2024-03-26 DIAGNOSIS — M6281 Muscle weakness (generalized): Secondary | ICD-10-CM | POA: Diagnosis not present

## 2024-03-26 DIAGNOSIS — I2602 Saddle embolus of pulmonary artery with acute cor pulmonale: Secondary | ICD-10-CM | POA: Diagnosis not present

## 2024-03-27 DIAGNOSIS — M6281 Muscle weakness (generalized): Secondary | ICD-10-CM | POA: Diagnosis not present

## 2024-03-27 DIAGNOSIS — R2681 Unsteadiness on feet: Secondary | ICD-10-CM | POA: Diagnosis not present

## 2024-03-27 DIAGNOSIS — R278 Other lack of coordination: Secondary | ICD-10-CM | POA: Diagnosis not present

## 2024-03-27 DIAGNOSIS — G9341 Metabolic encephalopathy: Secondary | ICD-10-CM | POA: Diagnosis not present

## 2024-03-27 DIAGNOSIS — I2602 Saddle embolus of pulmonary artery with acute cor pulmonale: Secondary | ICD-10-CM | POA: Diagnosis not present

## 2024-03-28 DIAGNOSIS — M6281 Muscle weakness (generalized): Secondary | ICD-10-CM | POA: Diagnosis not present

## 2024-03-28 DIAGNOSIS — R2681 Unsteadiness on feet: Secondary | ICD-10-CM | POA: Diagnosis not present

## 2024-03-28 DIAGNOSIS — G9341 Metabolic encephalopathy: Secondary | ICD-10-CM | POA: Diagnosis not present

## 2024-03-28 DIAGNOSIS — R278 Other lack of coordination: Secondary | ICD-10-CM | POA: Diagnosis not present

## 2024-03-28 DIAGNOSIS — I2602 Saddle embolus of pulmonary artery with acute cor pulmonale: Secondary | ICD-10-CM | POA: Diagnosis not present

## 2024-03-29 DIAGNOSIS — I2602 Saddle embolus of pulmonary artery with acute cor pulmonale: Secondary | ICD-10-CM | POA: Diagnosis not present

## 2024-03-29 DIAGNOSIS — R2681 Unsteadiness on feet: Secondary | ICD-10-CM | POA: Diagnosis not present

## 2024-03-29 DIAGNOSIS — R278 Other lack of coordination: Secondary | ICD-10-CM | POA: Diagnosis not present

## 2024-03-29 DIAGNOSIS — M6281 Muscle weakness (generalized): Secondary | ICD-10-CM | POA: Diagnosis not present

## 2024-03-29 DIAGNOSIS — G9341 Metabolic encephalopathy: Secondary | ICD-10-CM | POA: Diagnosis not present

## 2024-03-31 DIAGNOSIS — I2602 Saddle embolus of pulmonary artery with acute cor pulmonale: Secondary | ICD-10-CM | POA: Diagnosis not present

## 2024-03-31 DIAGNOSIS — R278 Other lack of coordination: Secondary | ICD-10-CM | POA: Diagnosis not present

## 2024-03-31 DIAGNOSIS — I10 Essential (primary) hypertension: Secondary | ICD-10-CM | POA: Diagnosis not present

## 2024-03-31 DIAGNOSIS — I4891 Unspecified atrial fibrillation: Secondary | ICD-10-CM | POA: Diagnosis not present

## 2024-03-31 DIAGNOSIS — M6281 Muscle weakness (generalized): Secondary | ICD-10-CM | POA: Diagnosis not present

## 2024-03-31 DIAGNOSIS — R2681 Unsteadiness on feet: Secondary | ICD-10-CM | POA: Diagnosis not present

## 2024-03-31 DIAGNOSIS — G9341 Metabolic encephalopathy: Secondary | ICD-10-CM | POA: Diagnosis not present

## 2024-03-31 DIAGNOSIS — R0600 Dyspnea, unspecified: Secondary | ICD-10-CM | POA: Diagnosis not present

## 2024-04-01 DIAGNOSIS — R278 Other lack of coordination: Secondary | ICD-10-CM | POA: Diagnosis not present

## 2024-04-01 DIAGNOSIS — G9341 Metabolic encephalopathy: Secondary | ICD-10-CM | POA: Diagnosis not present

## 2024-04-01 DIAGNOSIS — R2681 Unsteadiness on feet: Secondary | ICD-10-CM | POA: Diagnosis not present

## 2024-04-01 DIAGNOSIS — M6281 Muscle weakness (generalized): Secondary | ICD-10-CM | POA: Diagnosis not present

## 2024-04-01 DIAGNOSIS — I2602 Saddle embolus of pulmonary artery with acute cor pulmonale: Secondary | ICD-10-CM | POA: Diagnosis not present

## 2024-04-02 DIAGNOSIS — R278 Other lack of coordination: Secondary | ICD-10-CM | POA: Diagnosis not present

## 2024-04-02 DIAGNOSIS — I2602 Saddle embolus of pulmonary artery with acute cor pulmonale: Secondary | ICD-10-CM | POA: Diagnosis not present

## 2024-04-02 DIAGNOSIS — M6281 Muscle weakness (generalized): Secondary | ICD-10-CM | POA: Diagnosis not present

## 2024-04-02 DIAGNOSIS — G9341 Metabolic encephalopathy: Secondary | ICD-10-CM | POA: Diagnosis not present

## 2024-04-02 DIAGNOSIS — R2681 Unsteadiness on feet: Secondary | ICD-10-CM | POA: Diagnosis not present

## 2024-04-03 DIAGNOSIS — R2681 Unsteadiness on feet: Secondary | ICD-10-CM | POA: Diagnosis not present

## 2024-04-03 DIAGNOSIS — G9341 Metabolic encephalopathy: Secondary | ICD-10-CM | POA: Diagnosis not present

## 2024-04-03 DIAGNOSIS — I2602 Saddle embolus of pulmonary artery with acute cor pulmonale: Secondary | ICD-10-CM | POA: Diagnosis not present

## 2024-04-03 DIAGNOSIS — M6281 Muscle weakness (generalized): Secondary | ICD-10-CM | POA: Diagnosis not present

## 2024-04-03 DIAGNOSIS — R278 Other lack of coordination: Secondary | ICD-10-CM | POA: Diagnosis not present

## 2024-04-04 DIAGNOSIS — J449 Chronic obstructive pulmonary disease, unspecified: Secondary | ICD-10-CM | POA: Diagnosis not present

## 2024-04-04 DIAGNOSIS — G4700 Insomnia, unspecified: Secondary | ICD-10-CM | POA: Diagnosis not present

## 2024-04-04 DIAGNOSIS — G9341 Metabolic encephalopathy: Secondary | ICD-10-CM | POA: Diagnosis not present

## 2024-04-04 DIAGNOSIS — M6281 Muscle weakness (generalized): Secondary | ICD-10-CM | POA: Diagnosis not present

## 2024-04-04 DIAGNOSIS — I10 Essential (primary) hypertension: Secondary | ICD-10-CM | POA: Diagnosis not present

## 2024-04-04 DIAGNOSIS — I2602 Saddle embolus of pulmonary artery with acute cor pulmonale: Secondary | ICD-10-CM | POA: Diagnosis not present

## 2024-04-04 DIAGNOSIS — R2681 Unsteadiness on feet: Secondary | ICD-10-CM | POA: Diagnosis not present

## 2024-04-04 DIAGNOSIS — R278 Other lack of coordination: Secondary | ICD-10-CM | POA: Diagnosis not present

## 2024-04-06 DIAGNOSIS — R278 Other lack of coordination: Secondary | ICD-10-CM | POA: Diagnosis not present

## 2024-04-06 DIAGNOSIS — R2681 Unsteadiness on feet: Secondary | ICD-10-CM | POA: Diagnosis not present

## 2024-04-06 DIAGNOSIS — M6281 Muscle weakness (generalized): Secondary | ICD-10-CM | POA: Diagnosis not present

## 2024-04-06 DIAGNOSIS — G9341 Metabolic encephalopathy: Secondary | ICD-10-CM | POA: Diagnosis not present

## 2024-04-06 DIAGNOSIS — I2602 Saddle embolus of pulmonary artery with acute cor pulmonale: Secondary | ICD-10-CM | POA: Diagnosis not present

## 2024-04-08 DIAGNOSIS — G9341 Metabolic encephalopathy: Secondary | ICD-10-CM | POA: Diagnosis not present

## 2024-04-08 DIAGNOSIS — R2681 Unsteadiness on feet: Secondary | ICD-10-CM | POA: Diagnosis not present

## 2024-04-08 DIAGNOSIS — I2602 Saddle embolus of pulmonary artery with acute cor pulmonale: Secondary | ICD-10-CM | POA: Diagnosis not present

## 2024-04-08 DIAGNOSIS — R278 Other lack of coordination: Secondary | ICD-10-CM | POA: Diagnosis not present

## 2024-04-08 DIAGNOSIS — M6281 Muscle weakness (generalized): Secondary | ICD-10-CM | POA: Diagnosis not present

## 2024-04-09 DIAGNOSIS — R278 Other lack of coordination: Secondary | ICD-10-CM | POA: Diagnosis not present

## 2024-04-09 DIAGNOSIS — M6281 Muscle weakness (generalized): Secondary | ICD-10-CM | POA: Diagnosis not present

## 2024-04-09 DIAGNOSIS — G9341 Metabolic encephalopathy: Secondary | ICD-10-CM | POA: Diagnosis not present

## 2024-04-09 DIAGNOSIS — I2602 Saddle embolus of pulmonary artery with acute cor pulmonale: Secondary | ICD-10-CM | POA: Diagnosis not present

## 2024-04-09 DIAGNOSIS — R2681 Unsteadiness on feet: Secondary | ICD-10-CM | POA: Diagnosis not present

## 2024-04-10 DIAGNOSIS — M6281 Muscle weakness (generalized): Secondary | ICD-10-CM | POA: Diagnosis not present

## 2024-04-10 DIAGNOSIS — G9341 Metabolic encephalopathy: Secondary | ICD-10-CM | POA: Diagnosis not present

## 2024-04-10 DIAGNOSIS — R278 Other lack of coordination: Secondary | ICD-10-CM | POA: Diagnosis not present

## 2024-04-10 DIAGNOSIS — I2602 Saddle embolus of pulmonary artery with acute cor pulmonale: Secondary | ICD-10-CM | POA: Diagnosis not present

## 2024-04-10 DIAGNOSIS — R2681 Unsteadiness on feet: Secondary | ICD-10-CM | POA: Diagnosis not present

## 2024-04-11 DIAGNOSIS — R2681 Unsteadiness on feet: Secondary | ICD-10-CM | POA: Diagnosis not present

## 2024-04-11 DIAGNOSIS — G9341 Metabolic encephalopathy: Secondary | ICD-10-CM | POA: Diagnosis not present

## 2024-04-11 DIAGNOSIS — R278 Other lack of coordination: Secondary | ICD-10-CM | POA: Diagnosis not present

## 2024-04-11 DIAGNOSIS — I2602 Saddle embolus of pulmonary artery with acute cor pulmonale: Secondary | ICD-10-CM | POA: Diagnosis not present

## 2024-04-11 DIAGNOSIS — M6281 Muscle weakness (generalized): Secondary | ICD-10-CM | POA: Diagnosis not present

## 2024-04-12 DIAGNOSIS — G9341 Metabolic encephalopathy: Secondary | ICD-10-CM | POA: Diagnosis not present

## 2024-04-12 DIAGNOSIS — R2681 Unsteadiness on feet: Secondary | ICD-10-CM | POA: Diagnosis not present

## 2024-04-12 DIAGNOSIS — M6281 Muscle weakness (generalized): Secondary | ICD-10-CM | POA: Diagnosis not present

## 2024-04-12 DIAGNOSIS — I2602 Saddle embolus of pulmonary artery with acute cor pulmonale: Secondary | ICD-10-CM | POA: Diagnosis not present

## 2024-04-12 DIAGNOSIS — R278 Other lack of coordination: Secondary | ICD-10-CM | POA: Diagnosis not present

## 2024-04-14 DIAGNOSIS — M6281 Muscle weakness (generalized): Secondary | ICD-10-CM | POA: Diagnosis not present

## 2024-04-14 DIAGNOSIS — I2602 Saddle embolus of pulmonary artery with acute cor pulmonale: Secondary | ICD-10-CM | POA: Diagnosis not present

## 2024-04-14 DIAGNOSIS — R2681 Unsteadiness on feet: Secondary | ICD-10-CM | POA: Diagnosis not present

## 2024-04-14 DIAGNOSIS — R278 Other lack of coordination: Secondary | ICD-10-CM | POA: Diagnosis not present

## 2024-04-14 DIAGNOSIS — G9341 Metabolic encephalopathy: Secondary | ICD-10-CM | POA: Diagnosis not present

## 2024-04-15 DIAGNOSIS — Z86711 Personal history of pulmonary embolism: Secondary | ICD-10-CM | POA: Diagnosis not present

## 2024-04-15 DIAGNOSIS — J449 Chronic obstructive pulmonary disease, unspecified: Secondary | ICD-10-CM | POA: Diagnosis not present

## 2024-04-15 DIAGNOSIS — G9341 Metabolic encephalopathy: Secondary | ICD-10-CM | POA: Diagnosis not present

## 2024-04-15 DIAGNOSIS — R2681 Unsteadiness on feet: Secondary | ICD-10-CM | POA: Diagnosis not present

## 2024-04-15 DIAGNOSIS — M6281 Muscle weakness (generalized): Secondary | ICD-10-CM | POA: Diagnosis not present

## 2024-04-15 DIAGNOSIS — I2602 Saddle embolus of pulmonary artery with acute cor pulmonale: Secondary | ICD-10-CM | POA: Diagnosis not present

## 2024-04-15 DIAGNOSIS — I10 Essential (primary) hypertension: Secondary | ICD-10-CM | POA: Diagnosis not present

## 2024-04-15 DIAGNOSIS — G4700 Insomnia, unspecified: Secondary | ICD-10-CM | POA: Diagnosis not present

## 2024-04-15 DIAGNOSIS — R278 Other lack of coordination: Secondary | ICD-10-CM | POA: Diagnosis not present

## 2024-04-15 DIAGNOSIS — I4891 Unspecified atrial fibrillation: Secondary | ICD-10-CM | POA: Diagnosis not present

## 2024-04-16 DIAGNOSIS — M6281 Muscle weakness (generalized): Secondary | ICD-10-CM | POA: Diagnosis not present

## 2024-04-16 DIAGNOSIS — R2681 Unsteadiness on feet: Secondary | ICD-10-CM | POA: Diagnosis not present

## 2024-04-16 DIAGNOSIS — G9341 Metabolic encephalopathy: Secondary | ICD-10-CM | POA: Diagnosis not present

## 2024-04-16 DIAGNOSIS — I2602 Saddle embolus of pulmonary artery with acute cor pulmonale: Secondary | ICD-10-CM | POA: Diagnosis not present

## 2024-04-16 DIAGNOSIS — R278 Other lack of coordination: Secondary | ICD-10-CM | POA: Diagnosis not present

## 2024-04-17 DIAGNOSIS — G9341 Metabolic encephalopathy: Secondary | ICD-10-CM | POA: Diagnosis not present

## 2024-04-17 DIAGNOSIS — R2681 Unsteadiness on feet: Secondary | ICD-10-CM | POA: Diagnosis not present

## 2024-04-17 DIAGNOSIS — R278 Other lack of coordination: Secondary | ICD-10-CM | POA: Diagnosis not present

## 2024-04-17 DIAGNOSIS — M6281 Muscle weakness (generalized): Secondary | ICD-10-CM | POA: Diagnosis not present

## 2024-04-17 DIAGNOSIS — I2602 Saddle embolus of pulmonary artery with acute cor pulmonale: Secondary | ICD-10-CM | POA: Diagnosis not present

## 2024-04-18 DIAGNOSIS — G9341 Metabolic encephalopathy: Secondary | ICD-10-CM | POA: Diagnosis not present

## 2024-04-18 DIAGNOSIS — R2681 Unsteadiness on feet: Secondary | ICD-10-CM | POA: Diagnosis not present

## 2024-04-18 DIAGNOSIS — I2602 Saddle embolus of pulmonary artery with acute cor pulmonale: Secondary | ICD-10-CM | POA: Diagnosis not present

## 2024-04-18 DIAGNOSIS — R278 Other lack of coordination: Secondary | ICD-10-CM | POA: Diagnosis not present

## 2024-04-18 DIAGNOSIS — M6281 Muscle weakness (generalized): Secondary | ICD-10-CM | POA: Diagnosis not present

## 2024-04-19 DIAGNOSIS — R278 Other lack of coordination: Secondary | ICD-10-CM | POA: Diagnosis not present

## 2024-04-19 DIAGNOSIS — I5032 Chronic diastolic (congestive) heart failure: Secondary | ICD-10-CM | POA: Diagnosis not present

## 2024-04-19 DIAGNOSIS — G9341 Metabolic encephalopathy: Secondary | ICD-10-CM | POA: Diagnosis not present

## 2024-04-19 DIAGNOSIS — I2602 Saddle embolus of pulmonary artery with acute cor pulmonale: Secondary | ICD-10-CM | POA: Diagnosis not present

## 2024-04-19 DIAGNOSIS — I739 Peripheral vascular disease, unspecified: Secondary | ICD-10-CM | POA: Diagnosis not present

## 2024-04-19 DIAGNOSIS — M6281 Muscle weakness (generalized): Secondary | ICD-10-CM | POA: Diagnosis not present

## 2024-04-19 DIAGNOSIS — R2681 Unsteadiness on feet: Secondary | ICD-10-CM | POA: Diagnosis not present

## 2024-04-21 DIAGNOSIS — M6281 Muscle weakness (generalized): Secondary | ICD-10-CM | POA: Diagnosis not present

## 2024-04-21 DIAGNOSIS — R2681 Unsteadiness on feet: Secondary | ICD-10-CM | POA: Diagnosis not present

## 2024-04-21 DIAGNOSIS — G9341 Metabolic encephalopathy: Secondary | ICD-10-CM | POA: Diagnosis not present

## 2024-04-21 DIAGNOSIS — R278 Other lack of coordination: Secondary | ICD-10-CM | POA: Diagnosis not present

## 2024-04-21 DIAGNOSIS — I2602 Saddle embolus of pulmonary artery with acute cor pulmonale: Secondary | ICD-10-CM | POA: Diagnosis not present

## 2024-04-22 DIAGNOSIS — M6281 Muscle weakness (generalized): Secondary | ICD-10-CM | POA: Diagnosis not present

## 2024-04-22 DIAGNOSIS — I2602 Saddle embolus of pulmonary artery with acute cor pulmonale: Secondary | ICD-10-CM | POA: Diagnosis not present

## 2024-04-22 DIAGNOSIS — N390 Urinary tract infection, site not specified: Secondary | ICD-10-CM | POA: Diagnosis not present

## 2024-04-22 DIAGNOSIS — I10 Essential (primary) hypertension: Secondary | ICD-10-CM | POA: Diagnosis not present

## 2024-04-22 DIAGNOSIS — G9341 Metabolic encephalopathy: Secondary | ICD-10-CM | POA: Diagnosis not present

## 2024-04-22 DIAGNOSIS — J449 Chronic obstructive pulmonary disease, unspecified: Secondary | ICD-10-CM | POA: Diagnosis not present

## 2024-04-22 DIAGNOSIS — I4891 Unspecified atrial fibrillation: Secondary | ICD-10-CM | POA: Diagnosis not present

## 2024-04-22 DIAGNOSIS — R2681 Unsteadiness on feet: Secondary | ICD-10-CM | POA: Diagnosis not present

## 2024-04-22 DIAGNOSIS — R278 Other lack of coordination: Secondary | ICD-10-CM | POA: Diagnosis not present

## 2024-04-23 DIAGNOSIS — G9341 Metabolic encephalopathy: Secondary | ICD-10-CM | POA: Diagnosis not present

## 2024-04-23 DIAGNOSIS — R2681 Unsteadiness on feet: Secondary | ICD-10-CM | POA: Diagnosis not present

## 2024-04-23 DIAGNOSIS — M6281 Muscle weakness (generalized): Secondary | ICD-10-CM | POA: Diagnosis not present

## 2024-04-23 DIAGNOSIS — R451 Restlessness and agitation: Secondary | ICD-10-CM | POA: Diagnosis not present

## 2024-04-23 DIAGNOSIS — I2602 Saddle embolus of pulmonary artery with acute cor pulmonale: Secondary | ICD-10-CM | POA: Diagnosis not present

## 2024-04-23 DIAGNOSIS — R278 Other lack of coordination: Secondary | ICD-10-CM | POA: Diagnosis not present

## 2024-04-24 DIAGNOSIS — R0602 Shortness of breath: Secondary | ICD-10-CM | POA: Diagnosis not present

## 2024-04-24 DIAGNOSIS — R2681 Unsteadiness on feet: Secondary | ICD-10-CM | POA: Diagnosis not present

## 2024-04-24 DIAGNOSIS — R278 Other lack of coordination: Secondary | ICD-10-CM | POA: Diagnosis not present

## 2024-04-24 DIAGNOSIS — M6281 Muscle weakness (generalized): Secondary | ICD-10-CM | POA: Diagnosis not present

## 2024-04-24 DIAGNOSIS — G9341 Metabolic encephalopathy: Secondary | ICD-10-CM | POA: Diagnosis not present

## 2024-04-24 DIAGNOSIS — I2602 Saddle embolus of pulmonary artery with acute cor pulmonale: Secondary | ICD-10-CM | POA: Diagnosis not present

## 2024-04-25 DIAGNOSIS — R2681 Unsteadiness on feet: Secondary | ICD-10-CM | POA: Diagnosis not present

## 2024-04-25 DIAGNOSIS — G9341 Metabolic encephalopathy: Secondary | ICD-10-CM | POA: Diagnosis not present

## 2024-04-25 DIAGNOSIS — R278 Other lack of coordination: Secondary | ICD-10-CM | POA: Diagnosis not present

## 2024-04-25 DIAGNOSIS — J984 Other disorders of lung: Secondary | ICD-10-CM | POA: Diagnosis not present

## 2024-04-25 DIAGNOSIS — I509 Heart failure, unspecified: Secondary | ICD-10-CM | POA: Diagnosis not present

## 2024-04-25 DIAGNOSIS — J90 Pleural effusion, not elsewhere classified: Secondary | ICD-10-CM | POA: Diagnosis not present

## 2024-04-25 DIAGNOSIS — M6281 Muscle weakness (generalized): Secondary | ICD-10-CM | POA: Diagnosis not present

## 2024-04-25 DIAGNOSIS — I2602 Saddle embolus of pulmonary artery with acute cor pulmonale: Secondary | ICD-10-CM | POA: Diagnosis not present

## 2024-04-28 DIAGNOSIS — M6281 Muscle weakness (generalized): Secondary | ICD-10-CM | POA: Diagnosis not present

## 2024-04-28 DIAGNOSIS — R278 Other lack of coordination: Secondary | ICD-10-CM | POA: Diagnosis not present

## 2024-04-28 DIAGNOSIS — I2602 Saddle embolus of pulmonary artery with acute cor pulmonale: Secondary | ICD-10-CM | POA: Diagnosis not present

## 2024-04-28 DIAGNOSIS — R2681 Unsteadiness on feet: Secondary | ICD-10-CM | POA: Diagnosis not present

## 2024-04-28 DIAGNOSIS — G9341 Metabolic encephalopathy: Secondary | ICD-10-CM | POA: Diagnosis not present

## 2024-04-29 DIAGNOSIS — R278 Other lack of coordination: Secondary | ICD-10-CM | POA: Diagnosis not present

## 2024-04-29 DIAGNOSIS — I2602 Saddle embolus of pulmonary artery with acute cor pulmonale: Secondary | ICD-10-CM | POA: Diagnosis not present

## 2024-04-29 DIAGNOSIS — G9341 Metabolic encephalopathy: Secondary | ICD-10-CM | POA: Diagnosis not present

## 2024-04-29 DIAGNOSIS — R2681 Unsteadiness on feet: Secondary | ICD-10-CM | POA: Diagnosis not present

## 2024-04-29 DIAGNOSIS — M6281 Muscle weakness (generalized): Secondary | ICD-10-CM | POA: Diagnosis not present

## 2024-04-30 DIAGNOSIS — M6281 Muscle weakness (generalized): Secondary | ICD-10-CM | POA: Diagnosis not present

## 2024-04-30 DIAGNOSIS — I4891 Unspecified atrial fibrillation: Secondary | ICD-10-CM | POA: Diagnosis not present

## 2024-04-30 DIAGNOSIS — R2681 Unsteadiness on feet: Secondary | ICD-10-CM | POA: Diagnosis not present

## 2024-04-30 DIAGNOSIS — R278 Other lack of coordination: Secondary | ICD-10-CM | POA: Diagnosis not present

## 2024-04-30 DIAGNOSIS — R0602 Shortness of breath: Secondary | ICD-10-CM | POA: Diagnosis not present

## 2024-04-30 DIAGNOSIS — I2602 Saddle embolus of pulmonary artery with acute cor pulmonale: Secondary | ICD-10-CM | POA: Diagnosis not present

## 2024-04-30 DIAGNOSIS — M810 Age-related osteoporosis without current pathological fracture: Secondary | ICD-10-CM | POA: Diagnosis not present

## 2024-04-30 DIAGNOSIS — G4733 Obstructive sleep apnea (adult) (pediatric): Secondary | ICD-10-CM | POA: Diagnosis not present

## 2024-04-30 DIAGNOSIS — N3281 Overactive bladder: Secondary | ICD-10-CM | POA: Diagnosis not present

## 2024-04-30 DIAGNOSIS — Z79899 Other long term (current) drug therapy: Secondary | ICD-10-CM | POA: Diagnosis not present

## 2024-04-30 DIAGNOSIS — R410 Disorientation, unspecified: Secondary | ICD-10-CM | POA: Diagnosis not present

## 2024-04-30 DIAGNOSIS — G928 Other toxic encephalopathy: Secondary | ICD-10-CM | POA: Diagnosis not present

## 2024-04-30 DIAGNOSIS — R451 Restlessness and agitation: Secondary | ICD-10-CM | POA: Diagnosis not present

## 2024-04-30 DIAGNOSIS — I10 Essential (primary) hypertension: Secondary | ICD-10-CM | POA: Diagnosis not present

## 2024-04-30 DIAGNOSIS — G9341 Metabolic encephalopathy: Secondary | ICD-10-CM | POA: Diagnosis not present

## 2024-05-01 DIAGNOSIS — G9341 Metabolic encephalopathy: Secondary | ICD-10-CM | POA: Diagnosis not present

## 2024-05-01 DIAGNOSIS — Z7401 Bed confinement status: Secondary | ICD-10-CM | POA: Diagnosis not present

## 2024-05-01 DIAGNOSIS — R000 Tachycardia, unspecified: Secondary | ICD-10-CM | POA: Diagnosis not present

## 2024-05-01 DIAGNOSIS — R0902 Hypoxemia: Secondary | ICD-10-CM | POA: Diagnosis not present

## 2024-05-01 DIAGNOSIS — I10 Essential (primary) hypertension: Secondary | ICD-10-CM | POA: Diagnosis not present

## 2024-05-02 DIAGNOSIS — M6281 Muscle weakness (generalized): Secondary | ICD-10-CM | POA: Diagnosis not present

## 2024-05-02 DIAGNOSIS — G9341 Metabolic encephalopathy: Secondary | ICD-10-CM | POA: Diagnosis not present

## 2024-05-02 DIAGNOSIS — R2681 Unsteadiness on feet: Secondary | ICD-10-CM | POA: Diagnosis not present

## 2024-05-02 DIAGNOSIS — R278 Other lack of coordination: Secondary | ICD-10-CM | POA: Diagnosis not present

## 2024-05-02 DIAGNOSIS — I2602 Saddle embolus of pulmonary artery with acute cor pulmonale: Secondary | ICD-10-CM | POA: Diagnosis not present

## 2024-05-03 DIAGNOSIS — R2681 Unsteadiness on feet: Secondary | ICD-10-CM | POA: Diagnosis not present

## 2024-05-03 DIAGNOSIS — R9431 Abnormal electrocardiogram [ECG] [EKG]: Secondary | ICD-10-CM | POA: Diagnosis not present

## 2024-05-03 DIAGNOSIS — I2602 Saddle embolus of pulmonary artery with acute cor pulmonale: Secondary | ICD-10-CM | POA: Diagnosis not present

## 2024-05-03 DIAGNOSIS — I480 Paroxysmal atrial fibrillation: Secondary | ICD-10-CM | POA: Diagnosis not present

## 2024-05-03 DIAGNOSIS — M6281 Muscle weakness (generalized): Secondary | ICD-10-CM | POA: Diagnosis not present

## 2024-05-03 DIAGNOSIS — G9341 Metabolic encephalopathy: Secondary | ICD-10-CM | POA: Diagnosis not present

## 2024-05-03 DIAGNOSIS — R278 Other lack of coordination: Secondary | ICD-10-CM | POA: Diagnosis not present

## 2024-05-05 DIAGNOSIS — R2681 Unsteadiness on feet: Secondary | ICD-10-CM | POA: Diagnosis not present

## 2024-05-05 DIAGNOSIS — G4733 Obstructive sleep apnea (adult) (pediatric): Secondary | ICD-10-CM | POA: Diagnosis not present

## 2024-05-05 DIAGNOSIS — M6281 Muscle weakness (generalized): Secondary | ICD-10-CM | POA: Diagnosis not present

## 2024-05-05 DIAGNOSIS — I2602 Saddle embolus of pulmonary artery with acute cor pulmonale: Secondary | ICD-10-CM | POA: Diagnosis not present

## 2024-05-05 DIAGNOSIS — I10 Essential (primary) hypertension: Secondary | ICD-10-CM | POA: Diagnosis not present

## 2024-05-05 DIAGNOSIS — G9341 Metabolic encephalopathy: Secondary | ICD-10-CM | POA: Diagnosis not present

## 2024-05-05 DIAGNOSIS — J449 Chronic obstructive pulmonary disease, unspecified: Secondary | ICD-10-CM | POA: Diagnosis not present

## 2024-05-05 DIAGNOSIS — R278 Other lack of coordination: Secondary | ICD-10-CM | POA: Diagnosis not present

## 2024-05-06 DIAGNOSIS — M6281 Muscle weakness (generalized): Secondary | ICD-10-CM | POA: Diagnosis not present

## 2024-05-06 DIAGNOSIS — I2602 Saddle embolus of pulmonary artery with acute cor pulmonale: Secondary | ICD-10-CM | POA: Diagnosis not present

## 2024-05-06 DIAGNOSIS — R278 Other lack of coordination: Secondary | ICD-10-CM | POA: Diagnosis not present

## 2024-05-06 DIAGNOSIS — R2681 Unsteadiness on feet: Secondary | ICD-10-CM | POA: Diagnosis not present

## 2024-05-06 DIAGNOSIS — G9341 Metabolic encephalopathy: Secondary | ICD-10-CM | POA: Diagnosis not present

## 2024-05-07 DIAGNOSIS — G9341 Metabolic encephalopathy: Secondary | ICD-10-CM | POA: Diagnosis not present

## 2024-05-07 DIAGNOSIS — M6281 Muscle weakness (generalized): Secondary | ICD-10-CM | POA: Diagnosis not present

## 2024-05-07 DIAGNOSIS — I2602 Saddle embolus of pulmonary artery with acute cor pulmonale: Secondary | ICD-10-CM | POA: Diagnosis not present

## 2024-05-07 DIAGNOSIS — R2681 Unsteadiness on feet: Secondary | ICD-10-CM | POA: Diagnosis not present

## 2024-05-07 DIAGNOSIS — R278 Other lack of coordination: Secondary | ICD-10-CM | POA: Diagnosis not present

## 2024-05-08 DIAGNOSIS — G9341 Metabolic encephalopathy: Secondary | ICD-10-CM | POA: Diagnosis not present

## 2024-05-08 DIAGNOSIS — I2602 Saddle embolus of pulmonary artery with acute cor pulmonale: Secondary | ICD-10-CM | POA: Diagnosis not present

## 2024-05-08 DIAGNOSIS — R2681 Unsteadiness on feet: Secondary | ICD-10-CM | POA: Diagnosis not present

## 2024-05-08 DIAGNOSIS — M6281 Muscle weakness (generalized): Secondary | ICD-10-CM | POA: Diagnosis not present

## 2024-05-08 DIAGNOSIS — R278 Other lack of coordination: Secondary | ICD-10-CM | POA: Diagnosis not present

## 2024-05-09 DIAGNOSIS — M6281 Muscle weakness (generalized): Secondary | ICD-10-CM | POA: Diagnosis not present

## 2024-05-09 DIAGNOSIS — G9341 Metabolic encephalopathy: Secondary | ICD-10-CM | POA: Diagnosis not present

## 2024-05-09 DIAGNOSIS — I2602 Saddle embolus of pulmonary artery with acute cor pulmonale: Secondary | ICD-10-CM | POA: Diagnosis not present

## 2024-05-09 DIAGNOSIS — R2681 Unsteadiness on feet: Secondary | ICD-10-CM | POA: Diagnosis not present

## 2024-05-09 DIAGNOSIS — R278 Other lack of coordination: Secondary | ICD-10-CM | POA: Diagnosis not present

## 2024-05-11 DIAGNOSIS — I2602 Saddle embolus of pulmonary artery with acute cor pulmonale: Secondary | ICD-10-CM | POA: Diagnosis not present

## 2024-05-11 DIAGNOSIS — M6281 Muscle weakness (generalized): Secondary | ICD-10-CM | POA: Diagnosis not present

## 2024-05-11 DIAGNOSIS — R278 Other lack of coordination: Secondary | ICD-10-CM | POA: Diagnosis not present

## 2024-05-11 DIAGNOSIS — G9341 Metabolic encephalopathy: Secondary | ICD-10-CM | POA: Diagnosis not present

## 2024-05-11 DIAGNOSIS — R2681 Unsteadiness on feet: Secondary | ICD-10-CM | POA: Diagnosis not present

## 2024-05-12 DIAGNOSIS — R2681 Unsteadiness on feet: Secondary | ICD-10-CM | POA: Diagnosis not present

## 2024-05-12 DIAGNOSIS — G9341 Metabolic encephalopathy: Secondary | ICD-10-CM | POA: Diagnosis not present

## 2024-05-12 DIAGNOSIS — M6281 Muscle weakness (generalized): Secondary | ICD-10-CM | POA: Diagnosis not present

## 2024-05-12 DIAGNOSIS — I2602 Saddle embolus of pulmonary artery with acute cor pulmonale: Secondary | ICD-10-CM | POA: Diagnosis not present

## 2024-05-12 DIAGNOSIS — R278 Other lack of coordination: Secondary | ICD-10-CM | POA: Diagnosis not present

## 2024-05-13 DIAGNOSIS — I2602 Saddle embolus of pulmonary artery with acute cor pulmonale: Secondary | ICD-10-CM | POA: Diagnosis not present

## 2024-05-13 DIAGNOSIS — R2681 Unsteadiness on feet: Secondary | ICD-10-CM | POA: Diagnosis not present

## 2024-05-13 DIAGNOSIS — G9341 Metabolic encephalopathy: Secondary | ICD-10-CM | POA: Diagnosis not present

## 2024-05-13 DIAGNOSIS — M6281 Muscle weakness (generalized): Secondary | ICD-10-CM | POA: Diagnosis not present

## 2024-05-13 DIAGNOSIS — R278 Other lack of coordination: Secondary | ICD-10-CM | POA: Diagnosis not present

## 2024-05-14 DIAGNOSIS — G9341 Metabolic encephalopathy: Secondary | ICD-10-CM | POA: Diagnosis not present

## 2024-05-14 DIAGNOSIS — R278 Other lack of coordination: Secondary | ICD-10-CM | POA: Diagnosis not present

## 2024-05-14 DIAGNOSIS — R2681 Unsteadiness on feet: Secondary | ICD-10-CM | POA: Diagnosis not present

## 2024-05-14 DIAGNOSIS — M6281 Muscle weakness (generalized): Secondary | ICD-10-CM | POA: Diagnosis not present

## 2024-05-14 DIAGNOSIS — I2602 Saddle embolus of pulmonary artery with acute cor pulmonale: Secondary | ICD-10-CM | POA: Diagnosis not present

## 2024-05-15 DIAGNOSIS — R278 Other lack of coordination: Secondary | ICD-10-CM | POA: Diagnosis not present

## 2024-05-15 DIAGNOSIS — R2681 Unsteadiness on feet: Secondary | ICD-10-CM | POA: Diagnosis not present

## 2024-05-15 DIAGNOSIS — I2602 Saddle embolus of pulmonary artery with acute cor pulmonale: Secondary | ICD-10-CM | POA: Diagnosis not present

## 2024-05-15 DIAGNOSIS — G9341 Metabolic encephalopathy: Secondary | ICD-10-CM | POA: Diagnosis not present

## 2024-05-15 DIAGNOSIS — M6281 Muscle weakness (generalized): Secondary | ICD-10-CM | POA: Diagnosis not present

## 2024-05-16 DIAGNOSIS — I2602 Saddle embolus of pulmonary artery with acute cor pulmonale: Secondary | ICD-10-CM | POA: Diagnosis not present

## 2024-05-16 DIAGNOSIS — R278 Other lack of coordination: Secondary | ICD-10-CM | POA: Diagnosis not present

## 2024-05-16 DIAGNOSIS — R2681 Unsteadiness on feet: Secondary | ICD-10-CM | POA: Diagnosis not present

## 2024-05-16 DIAGNOSIS — M6281 Muscle weakness (generalized): Secondary | ICD-10-CM | POA: Diagnosis not present

## 2024-05-16 DIAGNOSIS — G9341 Metabolic encephalopathy: Secondary | ICD-10-CM | POA: Diagnosis not present

## 2024-05-17 DIAGNOSIS — M6281 Muscle weakness (generalized): Secondary | ICD-10-CM | POA: Diagnosis not present

## 2024-05-17 DIAGNOSIS — I2602 Saddle embolus of pulmonary artery with acute cor pulmonale: Secondary | ICD-10-CM | POA: Diagnosis not present

## 2024-05-17 DIAGNOSIS — G9341 Metabolic encephalopathy: Secondary | ICD-10-CM | POA: Diagnosis not present

## 2024-05-17 DIAGNOSIS — R278 Other lack of coordination: Secondary | ICD-10-CM | POA: Diagnosis not present

## 2024-05-17 DIAGNOSIS — R2681 Unsteadiness on feet: Secondary | ICD-10-CM | POA: Diagnosis not present

## 2024-05-19 DIAGNOSIS — I2602 Saddle embolus of pulmonary artery with acute cor pulmonale: Secondary | ICD-10-CM | POA: Diagnosis not present

## 2024-05-19 DIAGNOSIS — Z79899 Other long term (current) drug therapy: Secondary | ICD-10-CM | POA: Diagnosis not present

## 2024-05-19 DIAGNOSIS — G9341 Metabolic encephalopathy: Secondary | ICD-10-CM | POA: Diagnosis not present

## 2024-05-19 DIAGNOSIS — R278 Other lack of coordination: Secondary | ICD-10-CM | POA: Diagnosis not present

## 2024-05-19 DIAGNOSIS — M6281 Muscle weakness (generalized): Secondary | ICD-10-CM | POA: Diagnosis not present

## 2024-05-19 DIAGNOSIS — I6201 Nontraumatic acute subdural hemorrhage: Secondary | ICD-10-CM | POA: Diagnosis not present

## 2024-05-19 DIAGNOSIS — R2681 Unsteadiness on feet: Secondary | ICD-10-CM | POA: Diagnosis not present

## 2024-05-20 DIAGNOSIS — J449 Chronic obstructive pulmonary disease, unspecified: Secondary | ICD-10-CM | POA: Diagnosis not present

## 2024-05-20 DIAGNOSIS — G9341 Metabolic encephalopathy: Secondary | ICD-10-CM | POA: Diagnosis not present

## 2024-05-20 DIAGNOSIS — M6281 Muscle weakness (generalized): Secondary | ICD-10-CM | POA: Diagnosis not present

## 2024-05-20 DIAGNOSIS — R2681 Unsteadiness on feet: Secondary | ICD-10-CM | POA: Diagnosis not present

## 2024-05-20 DIAGNOSIS — I2602 Saddle embolus of pulmonary artery with acute cor pulmonale: Secondary | ICD-10-CM | POA: Diagnosis not present

## 2024-05-20 DIAGNOSIS — R278 Other lack of coordination: Secondary | ICD-10-CM | POA: Diagnosis not present

## 2024-05-20 DIAGNOSIS — N390 Urinary tract infection, site not specified: Secondary | ICD-10-CM | POA: Diagnosis not present

## 2024-05-20 DIAGNOSIS — I10 Essential (primary) hypertension: Secondary | ICD-10-CM | POA: Diagnosis not present

## 2024-05-21 DIAGNOSIS — G9341 Metabolic encephalopathy: Secondary | ICD-10-CM | POA: Diagnosis not present

## 2024-05-21 DIAGNOSIS — R278 Other lack of coordination: Secondary | ICD-10-CM | POA: Diagnosis not present

## 2024-05-21 DIAGNOSIS — R2681 Unsteadiness on feet: Secondary | ICD-10-CM | POA: Diagnosis not present

## 2024-05-21 DIAGNOSIS — I2602 Saddle embolus of pulmonary artery with acute cor pulmonale: Secondary | ICD-10-CM | POA: Diagnosis not present

## 2024-05-21 DIAGNOSIS — M6281 Muscle weakness (generalized): Secondary | ICD-10-CM | POA: Diagnosis not present

## 2024-05-22 DIAGNOSIS — R278 Other lack of coordination: Secondary | ICD-10-CM | POA: Diagnosis not present

## 2024-05-22 DIAGNOSIS — I2602 Saddle embolus of pulmonary artery with acute cor pulmonale: Secondary | ICD-10-CM | POA: Diagnosis not present

## 2024-05-22 DIAGNOSIS — M6281 Muscle weakness (generalized): Secondary | ICD-10-CM | POA: Diagnosis not present

## 2024-05-22 DIAGNOSIS — R2681 Unsteadiness on feet: Secondary | ICD-10-CM | POA: Diagnosis not present

## 2024-05-22 DIAGNOSIS — G9341 Metabolic encephalopathy: Secondary | ICD-10-CM | POA: Diagnosis not present

## 2024-05-23 DIAGNOSIS — M6281 Muscle weakness (generalized): Secondary | ICD-10-CM | POA: Diagnosis not present

## 2024-05-23 DIAGNOSIS — G9341 Metabolic encephalopathy: Secondary | ICD-10-CM | POA: Diagnosis not present

## 2024-05-23 DIAGNOSIS — I2602 Saddle embolus of pulmonary artery with acute cor pulmonale: Secondary | ICD-10-CM | POA: Diagnosis not present

## 2024-05-23 DIAGNOSIS — R278 Other lack of coordination: Secondary | ICD-10-CM | POA: Diagnosis not present

## 2024-05-24 DIAGNOSIS — M6281 Muscle weakness (generalized): Secondary | ICD-10-CM | POA: Diagnosis not present

## 2024-05-24 DIAGNOSIS — G9341 Metabolic encephalopathy: Secondary | ICD-10-CM | POA: Diagnosis not present

## 2024-05-24 DIAGNOSIS — R278 Other lack of coordination: Secondary | ICD-10-CM | POA: Diagnosis not present

## 2024-05-24 DIAGNOSIS — I2602 Saddle embolus of pulmonary artery with acute cor pulmonale: Secondary | ICD-10-CM | POA: Diagnosis not present

## 2024-05-26 DIAGNOSIS — M6281 Muscle weakness (generalized): Secondary | ICD-10-CM | POA: Diagnosis not present

## 2024-05-26 DIAGNOSIS — I2602 Saddle embolus of pulmonary artery with acute cor pulmonale: Secondary | ICD-10-CM | POA: Diagnosis not present

## 2024-05-26 DIAGNOSIS — G9341 Metabolic encephalopathy: Secondary | ICD-10-CM | POA: Diagnosis not present

## 2024-05-26 DIAGNOSIS — R278 Other lack of coordination: Secondary | ICD-10-CM | POA: Diagnosis not present

## 2024-05-27 DIAGNOSIS — M6281 Muscle weakness (generalized): Secondary | ICD-10-CM | POA: Diagnosis not present

## 2024-05-27 DIAGNOSIS — R278 Other lack of coordination: Secondary | ICD-10-CM | POA: Diagnosis not present

## 2024-05-27 DIAGNOSIS — G9341 Metabolic encephalopathy: Secondary | ICD-10-CM | POA: Diagnosis not present

## 2024-05-28 DIAGNOSIS — I2602 Saddle embolus of pulmonary artery with acute cor pulmonale: Secondary | ICD-10-CM | POA: Diagnosis not present

## 2024-05-28 DIAGNOSIS — G9341 Metabolic encephalopathy: Secondary | ICD-10-CM | POA: Diagnosis not present

## 2024-05-28 DIAGNOSIS — M6281 Muscle weakness (generalized): Secondary | ICD-10-CM | POA: Diagnosis not present

## 2024-05-28 DIAGNOSIS — R278 Other lack of coordination: Secondary | ICD-10-CM | POA: Diagnosis not present

## 2024-05-29 DIAGNOSIS — M6281 Muscle weakness (generalized): Secondary | ICD-10-CM | POA: Diagnosis not present

## 2024-05-29 DIAGNOSIS — R278 Other lack of coordination: Secondary | ICD-10-CM | POA: Diagnosis not present

## 2024-05-29 DIAGNOSIS — G9341 Metabolic encephalopathy: Secondary | ICD-10-CM | POA: Diagnosis not present

## 2024-05-30 DIAGNOSIS — R278 Other lack of coordination: Secondary | ICD-10-CM | POA: Diagnosis not present

## 2024-05-30 DIAGNOSIS — G9341 Metabolic encephalopathy: Secondary | ICD-10-CM | POA: Diagnosis not present

## 2024-05-30 DIAGNOSIS — I2602 Saddle embolus of pulmonary artery with acute cor pulmonale: Secondary | ICD-10-CM | POA: Diagnosis not present

## 2024-05-30 DIAGNOSIS — M6281 Muscle weakness (generalized): Secondary | ICD-10-CM | POA: Diagnosis not present

## 2024-05-31 DIAGNOSIS — R278 Other lack of coordination: Secondary | ICD-10-CM | POA: Diagnosis not present

## 2024-05-31 DIAGNOSIS — M6281 Muscle weakness (generalized): Secondary | ICD-10-CM | POA: Diagnosis not present

## 2024-05-31 DIAGNOSIS — G9341 Metabolic encephalopathy: Secondary | ICD-10-CM | POA: Diagnosis not present

## 2024-06-02 DIAGNOSIS — R278 Other lack of coordination: Secondary | ICD-10-CM | POA: Diagnosis not present

## 2024-06-02 DIAGNOSIS — I2602 Saddle embolus of pulmonary artery with acute cor pulmonale: Secondary | ICD-10-CM | POA: Diagnosis not present

## 2024-06-02 DIAGNOSIS — R531 Weakness: Secondary | ICD-10-CM | POA: Diagnosis not present

## 2024-06-02 DIAGNOSIS — M6281 Muscle weakness (generalized): Secondary | ICD-10-CM | POA: Diagnosis not present

## 2024-06-02 DIAGNOSIS — I6200 Nontraumatic subdural hemorrhage, unspecified: Secondary | ICD-10-CM | POA: Diagnosis not present

## 2024-06-02 DIAGNOSIS — R410 Disorientation, unspecified: Secondary | ICD-10-CM | POA: Diagnosis not present

## 2024-06-02 DIAGNOSIS — G9341 Metabolic encephalopathy: Secondary | ICD-10-CM | POA: Diagnosis not present

## 2024-06-02 DIAGNOSIS — I10 Essential (primary) hypertension: Secondary | ICD-10-CM | POA: Diagnosis not present

## 2024-06-03 DIAGNOSIS — R278 Other lack of coordination: Secondary | ICD-10-CM | POA: Diagnosis not present

## 2024-06-03 DIAGNOSIS — G9341 Metabolic encephalopathy: Secondary | ICD-10-CM | POA: Diagnosis not present

## 2024-06-03 DIAGNOSIS — I2602 Saddle embolus of pulmonary artery with acute cor pulmonale: Secondary | ICD-10-CM | POA: Diagnosis not present

## 2024-06-03 DIAGNOSIS — M6281 Muscle weakness (generalized): Secondary | ICD-10-CM | POA: Diagnosis not present

## 2024-06-04 DIAGNOSIS — I4891 Unspecified atrial fibrillation: Secondary | ICD-10-CM | POA: Diagnosis not present

## 2024-06-04 DIAGNOSIS — G4700 Insomnia, unspecified: Secondary | ICD-10-CM | POA: Diagnosis not present

## 2024-06-04 DIAGNOSIS — K5900 Constipation, unspecified: Secondary | ICD-10-CM | POA: Diagnosis not present

## 2024-06-04 DIAGNOSIS — G9341 Metabolic encephalopathy: Secondary | ICD-10-CM | POA: Diagnosis not present

## 2024-06-04 DIAGNOSIS — R278 Other lack of coordination: Secondary | ICD-10-CM | POA: Diagnosis not present

## 2024-06-04 DIAGNOSIS — I2602 Saddle embolus of pulmonary artery with acute cor pulmonale: Secondary | ICD-10-CM | POA: Diagnosis not present

## 2024-06-04 DIAGNOSIS — M6281 Muscle weakness (generalized): Secondary | ICD-10-CM | POA: Diagnosis not present

## 2024-06-05 DIAGNOSIS — R278 Other lack of coordination: Secondary | ICD-10-CM | POA: Diagnosis not present

## 2024-06-05 DIAGNOSIS — M6281 Muscle weakness (generalized): Secondary | ICD-10-CM | POA: Diagnosis not present

## 2024-06-05 DIAGNOSIS — G9341 Metabolic encephalopathy: Secondary | ICD-10-CM | POA: Diagnosis not present

## 2024-06-05 DIAGNOSIS — I2602 Saddle embolus of pulmonary artery with acute cor pulmonale: Secondary | ICD-10-CM | POA: Diagnosis not present

## 2024-06-06 DIAGNOSIS — I2602 Saddle embolus of pulmonary artery with acute cor pulmonale: Secondary | ICD-10-CM | POA: Diagnosis not present

## 2024-06-06 DIAGNOSIS — G9341 Metabolic encephalopathy: Secondary | ICD-10-CM | POA: Diagnosis not present

## 2024-06-06 DIAGNOSIS — M6281 Muscle weakness (generalized): Secondary | ICD-10-CM | POA: Diagnosis not present

## 2024-06-06 DIAGNOSIS — R278 Other lack of coordination: Secondary | ICD-10-CM | POA: Diagnosis not present

## 2024-06-09 DIAGNOSIS — I2602 Saddle embolus of pulmonary artery with acute cor pulmonale: Secondary | ICD-10-CM | POA: Diagnosis not present

## 2024-06-09 DIAGNOSIS — G9341 Metabolic encephalopathy: Secondary | ICD-10-CM | POA: Diagnosis not present

## 2024-06-09 DIAGNOSIS — M6281 Muscle weakness (generalized): Secondary | ICD-10-CM | POA: Diagnosis not present

## 2024-06-09 DIAGNOSIS — R278 Other lack of coordination: Secondary | ICD-10-CM | POA: Diagnosis not present

## 2024-06-10 DIAGNOSIS — G9341 Metabolic encephalopathy: Secondary | ICD-10-CM | POA: Diagnosis not present

## 2024-06-10 DIAGNOSIS — R278 Other lack of coordination: Secondary | ICD-10-CM | POA: Diagnosis not present

## 2024-06-10 DIAGNOSIS — M6281 Muscle weakness (generalized): Secondary | ICD-10-CM | POA: Diagnosis not present

## 2024-06-11 DIAGNOSIS — M6281 Muscle weakness (generalized): Secondary | ICD-10-CM | POA: Diagnosis not present

## 2024-06-11 DIAGNOSIS — R278 Other lack of coordination: Secondary | ICD-10-CM | POA: Diagnosis not present

## 2024-06-11 DIAGNOSIS — G9341 Metabolic encephalopathy: Secondary | ICD-10-CM | POA: Diagnosis not present

## 2024-06-12 DIAGNOSIS — I2602 Saddle embolus of pulmonary artery with acute cor pulmonale: Secondary | ICD-10-CM | POA: Diagnosis not present

## 2024-06-12 DIAGNOSIS — M6281 Muscle weakness (generalized): Secondary | ICD-10-CM | POA: Diagnosis not present

## 2024-06-12 DIAGNOSIS — G9341 Metabolic encephalopathy: Secondary | ICD-10-CM | POA: Diagnosis not present

## 2024-06-12 DIAGNOSIS — R278 Other lack of coordination: Secondary | ICD-10-CM | POA: Diagnosis not present

## 2024-06-13 DIAGNOSIS — G4733 Obstructive sleep apnea (adult) (pediatric): Secondary | ICD-10-CM | POA: Diagnosis not present

## 2024-06-13 DIAGNOSIS — I2602 Saddle embolus of pulmonary artery with acute cor pulmonale: Secondary | ICD-10-CM | POA: Diagnosis not present

## 2024-06-13 DIAGNOSIS — R278 Other lack of coordination: Secondary | ICD-10-CM | POA: Diagnosis not present

## 2024-06-13 DIAGNOSIS — G9341 Metabolic encephalopathy: Secondary | ICD-10-CM | POA: Diagnosis not present

## 2024-06-13 DIAGNOSIS — M6281 Muscle weakness (generalized): Secondary | ICD-10-CM | POA: Diagnosis not present

## 2024-06-15 DIAGNOSIS — R278 Other lack of coordination: Secondary | ICD-10-CM | POA: Diagnosis not present

## 2024-06-15 DIAGNOSIS — M6281 Muscle weakness (generalized): Secondary | ICD-10-CM | POA: Diagnosis not present

## 2024-06-15 DIAGNOSIS — G9341 Metabolic encephalopathy: Secondary | ICD-10-CM | POA: Diagnosis not present

## 2024-06-16 DIAGNOSIS — R278 Other lack of coordination: Secondary | ICD-10-CM | POA: Diagnosis not present

## 2024-06-16 DIAGNOSIS — G9341 Metabolic encephalopathy: Secondary | ICD-10-CM | POA: Diagnosis not present

## 2024-06-16 DIAGNOSIS — I2602 Saddle embolus of pulmonary artery with acute cor pulmonale: Secondary | ICD-10-CM | POA: Diagnosis not present

## 2024-06-16 DIAGNOSIS — M6281 Muscle weakness (generalized): Secondary | ICD-10-CM | POA: Diagnosis not present

## 2024-06-17 DIAGNOSIS — R278 Other lack of coordination: Secondary | ICD-10-CM | POA: Diagnosis not present

## 2024-06-17 DIAGNOSIS — Z515 Encounter for palliative care: Secondary | ICD-10-CM | POA: Diagnosis not present

## 2024-06-17 DIAGNOSIS — I4891 Unspecified atrial fibrillation: Secondary | ICD-10-CM | POA: Diagnosis not present

## 2024-06-17 DIAGNOSIS — Z86711 Personal history of pulmonary embolism: Secondary | ICD-10-CM | POA: Diagnosis not present

## 2024-06-17 DIAGNOSIS — J449 Chronic obstructive pulmonary disease, unspecified: Secondary | ICD-10-CM | POA: Diagnosis not present

## 2024-06-17 DIAGNOSIS — I6930 Unspecified sequelae of cerebral infarction: Secondary | ICD-10-CM | POA: Diagnosis not present

## 2024-06-17 DIAGNOSIS — I10 Essential (primary) hypertension: Secondary | ICD-10-CM | POA: Diagnosis not present

## 2024-06-17 DIAGNOSIS — M6281 Muscle weakness (generalized): Secondary | ICD-10-CM | POA: Diagnosis not present

## 2024-06-17 DIAGNOSIS — G9341 Metabolic encephalopathy: Secondary | ICD-10-CM | POA: Diagnosis not present

## 2024-06-17 DIAGNOSIS — I2602 Saddle embolus of pulmonary artery with acute cor pulmonale: Secondary | ICD-10-CM | POA: Diagnosis not present

## 2024-06-18 DIAGNOSIS — I739 Peripheral vascular disease, unspecified: Secondary | ICD-10-CM | POA: Diagnosis not present

## 2024-06-18 DIAGNOSIS — B351 Tinea unguium: Secondary | ICD-10-CM | POA: Diagnosis not present

## 2024-06-18 DIAGNOSIS — M6281 Muscle weakness (generalized): Secondary | ICD-10-CM | POA: Diagnosis not present

## 2024-06-18 DIAGNOSIS — G9341 Metabolic encephalopathy: Secondary | ICD-10-CM | POA: Diagnosis not present

## 2024-06-18 DIAGNOSIS — R278 Other lack of coordination: Secondary | ICD-10-CM | POA: Diagnosis not present

## 2024-06-19 DIAGNOSIS — R278 Other lack of coordination: Secondary | ICD-10-CM | POA: Diagnosis not present

## 2024-06-19 DIAGNOSIS — M6281 Muscle weakness (generalized): Secondary | ICD-10-CM | POA: Diagnosis not present

## 2024-06-19 DIAGNOSIS — G9341 Metabolic encephalopathy: Secondary | ICD-10-CM | POA: Diagnosis not present

## 2024-06-20 DIAGNOSIS — G9341 Metabolic encephalopathy: Secondary | ICD-10-CM | POA: Diagnosis not present

## 2024-06-20 DIAGNOSIS — M6281 Muscle weakness (generalized): Secondary | ICD-10-CM | POA: Diagnosis not present

## 2024-06-20 DIAGNOSIS — R278 Other lack of coordination: Secondary | ICD-10-CM | POA: Diagnosis not present

## 2024-06-23 DIAGNOSIS — G9340 Encephalopathy, unspecified: Secondary | ICD-10-CM | POA: Diagnosis not present

## 2024-06-23 DIAGNOSIS — R2681 Unsteadiness on feet: Secondary | ICD-10-CM | POA: Diagnosis not present

## 2024-06-23 DIAGNOSIS — R278 Other lack of coordination: Secondary | ICD-10-CM | POA: Diagnosis not present

## 2024-06-23 DIAGNOSIS — M6281 Muscle weakness (generalized): Secondary | ICD-10-CM | POA: Diagnosis not present

## 2024-06-23 DIAGNOSIS — G9341 Metabolic encephalopathy: Secondary | ICD-10-CM | POA: Diagnosis not present

## 2024-06-24 DIAGNOSIS — R2681 Unsteadiness on feet: Secondary | ICD-10-CM | POA: Diagnosis not present

## 2024-06-24 DIAGNOSIS — R278 Other lack of coordination: Secondary | ICD-10-CM | POA: Diagnosis not present

## 2024-06-24 DIAGNOSIS — G9341 Metabolic encephalopathy: Secondary | ICD-10-CM | POA: Diagnosis not present

## 2024-06-24 DIAGNOSIS — G9340 Encephalopathy, unspecified: Secondary | ICD-10-CM | POA: Diagnosis not present

## 2024-06-25 DIAGNOSIS — R2681 Unsteadiness on feet: Secondary | ICD-10-CM | POA: Diagnosis not present

## 2024-06-25 DIAGNOSIS — G9341 Metabolic encephalopathy: Secondary | ICD-10-CM | POA: Diagnosis not present

## 2024-06-25 DIAGNOSIS — G9340 Encephalopathy, unspecified: Secondary | ICD-10-CM | POA: Diagnosis not present

## 2024-06-25 DIAGNOSIS — R278 Other lack of coordination: Secondary | ICD-10-CM | POA: Diagnosis not present

## 2024-06-25 DIAGNOSIS — M6281 Muscle weakness (generalized): Secondary | ICD-10-CM | POA: Diagnosis not present

## 2024-06-26 DIAGNOSIS — G9340 Encephalopathy, unspecified: Secondary | ICD-10-CM | POA: Diagnosis not present

## 2024-06-26 DIAGNOSIS — G9341 Metabolic encephalopathy: Secondary | ICD-10-CM | POA: Diagnosis not present

## 2024-06-26 DIAGNOSIS — R278 Other lack of coordination: Secondary | ICD-10-CM | POA: Diagnosis not present

## 2024-06-26 DIAGNOSIS — I2602 Saddle embolus of pulmonary artery with acute cor pulmonale: Secondary | ICD-10-CM | POA: Diagnosis not present

## 2024-06-26 DIAGNOSIS — M6281 Muscle weakness (generalized): Secondary | ICD-10-CM | POA: Diagnosis not present

## 2024-06-26 DIAGNOSIS — R2681 Unsteadiness on feet: Secondary | ICD-10-CM | POA: Diagnosis not present

## 2024-06-27 DIAGNOSIS — G9340 Encephalopathy, unspecified: Secondary | ICD-10-CM | POA: Diagnosis not present

## 2024-06-27 DIAGNOSIS — I2602 Saddle embolus of pulmonary artery with acute cor pulmonale: Secondary | ICD-10-CM | POA: Diagnosis not present

## 2024-06-27 DIAGNOSIS — R2681 Unsteadiness on feet: Secondary | ICD-10-CM | POA: Diagnosis not present

## 2024-06-27 DIAGNOSIS — G4733 Obstructive sleep apnea (adult) (pediatric): Secondary | ICD-10-CM | POA: Diagnosis not present

## 2024-06-27 DIAGNOSIS — R278 Other lack of coordination: Secondary | ICD-10-CM | POA: Diagnosis not present

## 2024-06-27 DIAGNOSIS — I959 Hypotension, unspecified: Secondary | ICD-10-CM | POA: Diagnosis not present

## 2024-06-27 DIAGNOSIS — Z9181 History of falling: Secondary | ICD-10-CM | POA: Diagnosis not present

## 2024-06-27 DIAGNOSIS — G9341 Metabolic encephalopathy: Secondary | ICD-10-CM | POA: Diagnosis not present

## 2024-06-27 DIAGNOSIS — M6281 Muscle weakness (generalized): Secondary | ICD-10-CM | POA: Diagnosis not present

## 2024-06-27 DIAGNOSIS — Z87440 Personal history of urinary (tract) infections: Secondary | ICD-10-CM | POA: Diagnosis not present

## 2024-06-30 DIAGNOSIS — M6281 Muscle weakness (generalized): Secondary | ICD-10-CM | POA: Diagnosis not present

## 2024-06-30 DIAGNOSIS — R2681 Unsteadiness on feet: Secondary | ICD-10-CM | POA: Diagnosis not present

## 2024-06-30 DIAGNOSIS — G9341 Metabolic encephalopathy: Secondary | ICD-10-CM | POA: Diagnosis not present

## 2024-06-30 DIAGNOSIS — R278 Other lack of coordination: Secondary | ICD-10-CM | POA: Diagnosis not present

## 2024-06-30 DIAGNOSIS — G9340 Encephalopathy, unspecified: Secondary | ICD-10-CM | POA: Diagnosis not present

## 2024-07-01 DIAGNOSIS — I2602 Saddle embolus of pulmonary artery with acute cor pulmonale: Secondary | ICD-10-CM | POA: Diagnosis not present

## 2024-07-01 DIAGNOSIS — R278 Other lack of coordination: Secondary | ICD-10-CM | POA: Diagnosis not present

## 2024-07-01 DIAGNOSIS — R2681 Unsteadiness on feet: Secondary | ICD-10-CM | POA: Diagnosis not present

## 2024-07-01 DIAGNOSIS — M6281 Muscle weakness (generalized): Secondary | ICD-10-CM | POA: Diagnosis not present

## 2024-07-01 DIAGNOSIS — G9340 Encephalopathy, unspecified: Secondary | ICD-10-CM | POA: Diagnosis not present

## 2024-07-01 DIAGNOSIS — G9341 Metabolic encephalopathy: Secondary | ICD-10-CM | POA: Diagnosis not present

## 2024-07-02 DIAGNOSIS — M6281 Muscle weakness (generalized): Secondary | ICD-10-CM | POA: Diagnosis not present

## 2024-07-02 DIAGNOSIS — G9340 Encephalopathy, unspecified: Secondary | ICD-10-CM | POA: Diagnosis not present

## 2024-07-02 DIAGNOSIS — R278 Other lack of coordination: Secondary | ICD-10-CM | POA: Diagnosis not present

## 2024-07-02 DIAGNOSIS — G9341 Metabolic encephalopathy: Secondary | ICD-10-CM | POA: Diagnosis not present

## 2024-07-02 DIAGNOSIS — I2602 Saddle embolus of pulmonary artery with acute cor pulmonale: Secondary | ICD-10-CM | POA: Diagnosis not present

## 2024-07-02 DIAGNOSIS — R2681 Unsteadiness on feet: Secondary | ICD-10-CM | POA: Diagnosis not present

## 2024-07-03 DIAGNOSIS — G9341 Metabolic encephalopathy: Secondary | ICD-10-CM | POA: Diagnosis not present

## 2024-07-03 DIAGNOSIS — I2602 Saddle embolus of pulmonary artery with acute cor pulmonale: Secondary | ICD-10-CM | POA: Diagnosis not present

## 2024-07-03 DIAGNOSIS — G9340 Encephalopathy, unspecified: Secondary | ICD-10-CM | POA: Diagnosis not present

## 2024-07-03 DIAGNOSIS — R278 Other lack of coordination: Secondary | ICD-10-CM | POA: Diagnosis not present

## 2024-07-03 DIAGNOSIS — R2681 Unsteadiness on feet: Secondary | ICD-10-CM | POA: Diagnosis not present

## 2024-07-04 DIAGNOSIS — I10 Essential (primary) hypertension: Secondary | ICD-10-CM | POA: Diagnosis not present

## 2024-07-04 DIAGNOSIS — R2681 Unsteadiness on feet: Secondary | ICD-10-CM | POA: Diagnosis not present

## 2024-07-04 DIAGNOSIS — G4700 Insomnia, unspecified: Secondary | ICD-10-CM | POA: Diagnosis not present

## 2024-07-04 DIAGNOSIS — G9341 Metabolic encephalopathy: Secondary | ICD-10-CM | POA: Diagnosis not present

## 2024-07-04 DIAGNOSIS — G4733 Obstructive sleep apnea (adult) (pediatric): Secondary | ICD-10-CM | POA: Diagnosis not present

## 2024-07-04 DIAGNOSIS — M6281 Muscle weakness (generalized): Secondary | ICD-10-CM | POA: Diagnosis not present

## 2024-07-04 DIAGNOSIS — G9340 Encephalopathy, unspecified: Secondary | ICD-10-CM | POA: Diagnosis not present

## 2024-07-04 DIAGNOSIS — J449 Chronic obstructive pulmonary disease, unspecified: Secondary | ICD-10-CM | POA: Diagnosis not present

## 2024-07-04 DIAGNOSIS — I2602 Saddle embolus of pulmonary artery with acute cor pulmonale: Secondary | ICD-10-CM | POA: Diagnosis not present

## 2024-07-04 DIAGNOSIS — R278 Other lack of coordination: Secondary | ICD-10-CM | POA: Diagnosis not present

## 2024-07-05 DIAGNOSIS — R278 Other lack of coordination: Secondary | ICD-10-CM | POA: Diagnosis not present

## 2024-07-05 DIAGNOSIS — G9341 Metabolic encephalopathy: Secondary | ICD-10-CM | POA: Diagnosis not present

## 2024-07-05 DIAGNOSIS — I2602 Saddle embolus of pulmonary artery with acute cor pulmonale: Secondary | ICD-10-CM | POA: Diagnosis not present

## 2024-07-05 DIAGNOSIS — G9340 Encephalopathy, unspecified: Secondary | ICD-10-CM | POA: Diagnosis not present

## 2024-07-05 DIAGNOSIS — R2681 Unsteadiness on feet: Secondary | ICD-10-CM | POA: Diagnosis not present

## 2024-07-06 DIAGNOSIS — G9341 Metabolic encephalopathy: Secondary | ICD-10-CM | POA: Diagnosis not present

## 2024-07-06 DIAGNOSIS — R2681 Unsteadiness on feet: Secondary | ICD-10-CM | POA: Diagnosis not present

## 2024-07-06 DIAGNOSIS — M6281 Muscle weakness (generalized): Secondary | ICD-10-CM | POA: Diagnosis not present

## 2024-07-06 DIAGNOSIS — R278 Other lack of coordination: Secondary | ICD-10-CM | POA: Diagnosis not present

## 2024-07-06 DIAGNOSIS — G9340 Encephalopathy, unspecified: Secondary | ICD-10-CM | POA: Diagnosis not present

## 2024-07-07 DIAGNOSIS — I2602 Saddle embolus of pulmonary artery with acute cor pulmonale: Secondary | ICD-10-CM | POA: Diagnosis not present

## 2024-07-07 DIAGNOSIS — G9340 Encephalopathy, unspecified: Secondary | ICD-10-CM | POA: Diagnosis not present

## 2024-07-07 DIAGNOSIS — G9341 Metabolic encephalopathy: Secondary | ICD-10-CM | POA: Diagnosis not present

## 2024-07-07 DIAGNOSIS — R278 Other lack of coordination: Secondary | ICD-10-CM | POA: Diagnosis not present

## 2024-07-07 DIAGNOSIS — M6281 Muscle weakness (generalized): Secondary | ICD-10-CM | POA: Diagnosis not present

## 2024-07-07 DIAGNOSIS — R2681 Unsteadiness on feet: Secondary | ICD-10-CM | POA: Diagnosis not present

## 2024-07-08 DIAGNOSIS — G9340 Encephalopathy, unspecified: Secondary | ICD-10-CM | POA: Diagnosis not present

## 2024-07-08 DIAGNOSIS — M6281 Muscle weakness (generalized): Secondary | ICD-10-CM | POA: Diagnosis not present

## 2024-07-08 DIAGNOSIS — G9341 Metabolic encephalopathy: Secondary | ICD-10-CM | POA: Diagnosis not present

## 2024-07-08 DIAGNOSIS — R2681 Unsteadiness on feet: Secondary | ICD-10-CM | POA: Diagnosis not present

## 2024-07-08 DIAGNOSIS — R278 Other lack of coordination: Secondary | ICD-10-CM | POA: Diagnosis not present

## 2024-07-09 DIAGNOSIS — M6281 Muscle weakness (generalized): Secondary | ICD-10-CM | POA: Diagnosis not present

## 2024-07-09 DIAGNOSIS — G9340 Encephalopathy, unspecified: Secondary | ICD-10-CM | POA: Diagnosis not present

## 2024-07-09 DIAGNOSIS — R278 Other lack of coordination: Secondary | ICD-10-CM | POA: Diagnosis not present

## 2024-07-09 DIAGNOSIS — G9341 Metabolic encephalopathy: Secondary | ICD-10-CM | POA: Diagnosis not present

## 2024-07-09 DIAGNOSIS — R2681 Unsteadiness on feet: Secondary | ICD-10-CM | POA: Diagnosis not present

## 2024-07-10 DIAGNOSIS — R278 Other lack of coordination: Secondary | ICD-10-CM | POA: Diagnosis not present

## 2024-07-10 DIAGNOSIS — M6281 Muscle weakness (generalized): Secondary | ICD-10-CM | POA: Diagnosis not present

## 2024-07-10 DIAGNOSIS — G9340 Encephalopathy, unspecified: Secondary | ICD-10-CM | POA: Diagnosis not present

## 2024-07-10 DIAGNOSIS — I2602 Saddle embolus of pulmonary artery with acute cor pulmonale: Secondary | ICD-10-CM | POA: Diagnosis not present

## 2024-07-10 DIAGNOSIS — R2681 Unsteadiness on feet: Secondary | ICD-10-CM | POA: Diagnosis not present

## 2024-07-10 DIAGNOSIS — G9341 Metabolic encephalopathy: Secondary | ICD-10-CM | POA: Diagnosis not present

## 2024-07-11 DIAGNOSIS — R2681 Unsteadiness on feet: Secondary | ICD-10-CM | POA: Diagnosis not present

## 2024-07-11 DIAGNOSIS — R278 Other lack of coordination: Secondary | ICD-10-CM | POA: Diagnosis not present

## 2024-07-11 DIAGNOSIS — G9340 Encephalopathy, unspecified: Secondary | ICD-10-CM | POA: Diagnosis not present

## 2024-07-11 DIAGNOSIS — M6281 Muscle weakness (generalized): Secondary | ICD-10-CM | POA: Diagnosis not present

## 2024-07-11 DIAGNOSIS — G9341 Metabolic encephalopathy: Secondary | ICD-10-CM | POA: Diagnosis not present

## 2024-07-13 DIAGNOSIS — G9340 Encephalopathy, unspecified: Secondary | ICD-10-CM | POA: Diagnosis not present

## 2024-07-13 DIAGNOSIS — R2681 Unsteadiness on feet: Secondary | ICD-10-CM | POA: Diagnosis not present

## 2024-07-13 DIAGNOSIS — G9341 Metabolic encephalopathy: Secondary | ICD-10-CM | POA: Diagnosis not present

## 2024-07-13 DIAGNOSIS — M6281 Muscle weakness (generalized): Secondary | ICD-10-CM | POA: Diagnosis not present

## 2024-07-13 DIAGNOSIS — R278 Other lack of coordination: Secondary | ICD-10-CM | POA: Diagnosis not present

## 2024-07-14 DIAGNOSIS — R278 Other lack of coordination: Secondary | ICD-10-CM | POA: Diagnosis not present

## 2024-07-14 DIAGNOSIS — R2681 Unsteadiness on feet: Secondary | ICD-10-CM | POA: Diagnosis not present

## 2024-07-14 DIAGNOSIS — M6281 Muscle weakness (generalized): Secondary | ICD-10-CM | POA: Diagnosis not present

## 2024-07-14 DIAGNOSIS — G9341 Metabolic encephalopathy: Secondary | ICD-10-CM | POA: Diagnosis not present

## 2024-07-14 DIAGNOSIS — G9340 Encephalopathy, unspecified: Secondary | ICD-10-CM | POA: Diagnosis not present

## 2024-07-15 DIAGNOSIS — M6281 Muscle weakness (generalized): Secondary | ICD-10-CM | POA: Diagnosis not present

## 2024-07-15 DIAGNOSIS — I2602 Saddle embolus of pulmonary artery with acute cor pulmonale: Secondary | ICD-10-CM | POA: Diagnosis not present

## 2024-07-15 DIAGNOSIS — R278 Other lack of coordination: Secondary | ICD-10-CM | POA: Diagnosis not present

## 2024-07-15 DIAGNOSIS — R2681 Unsteadiness on feet: Secondary | ICD-10-CM | POA: Diagnosis not present

## 2024-07-15 DIAGNOSIS — G9340 Encephalopathy, unspecified: Secondary | ICD-10-CM | POA: Diagnosis not present

## 2024-07-15 DIAGNOSIS — G9341 Metabolic encephalopathy: Secondary | ICD-10-CM | POA: Diagnosis not present

## 2024-07-16 DIAGNOSIS — G9341 Metabolic encephalopathy: Secondary | ICD-10-CM | POA: Diagnosis not present

## 2024-07-16 DIAGNOSIS — M6281 Muscle weakness (generalized): Secondary | ICD-10-CM | POA: Diagnosis not present

## 2024-07-16 DIAGNOSIS — R278 Other lack of coordination: Secondary | ICD-10-CM | POA: Diagnosis not present

## 2024-07-16 DIAGNOSIS — R2681 Unsteadiness on feet: Secondary | ICD-10-CM | POA: Diagnosis not present

## 2024-07-16 DIAGNOSIS — G9340 Encephalopathy, unspecified: Secondary | ICD-10-CM | POA: Diagnosis not present

## 2024-07-17 DIAGNOSIS — M6281 Muscle weakness (generalized): Secondary | ICD-10-CM | POA: Diagnosis not present

## 2024-07-17 DIAGNOSIS — I2602 Saddle embolus of pulmonary artery with acute cor pulmonale: Secondary | ICD-10-CM | POA: Diagnosis not present

## 2024-07-17 DIAGNOSIS — G9340 Encephalopathy, unspecified: Secondary | ICD-10-CM | POA: Diagnosis not present

## 2024-07-17 DIAGNOSIS — R2681 Unsteadiness on feet: Secondary | ICD-10-CM | POA: Diagnosis not present

## 2024-07-17 DIAGNOSIS — R278 Other lack of coordination: Secondary | ICD-10-CM | POA: Diagnosis not present

## 2024-07-17 DIAGNOSIS — G9341 Metabolic encephalopathy: Secondary | ICD-10-CM | POA: Diagnosis not present

## 2024-07-18 DIAGNOSIS — G9340 Encephalopathy, unspecified: Secondary | ICD-10-CM | POA: Diagnosis not present

## 2024-07-18 DIAGNOSIS — R278 Other lack of coordination: Secondary | ICD-10-CM | POA: Diagnosis not present

## 2024-07-18 DIAGNOSIS — M6281 Muscle weakness (generalized): Secondary | ICD-10-CM | POA: Diagnosis not present

## 2024-07-18 DIAGNOSIS — G9341 Metabolic encephalopathy: Secondary | ICD-10-CM | POA: Diagnosis not present

## 2024-07-18 DIAGNOSIS — R2681 Unsteadiness on feet: Secondary | ICD-10-CM | POA: Diagnosis not present

## 2024-07-21 DIAGNOSIS — M6281 Muscle weakness (generalized): Secondary | ICD-10-CM | POA: Diagnosis not present

## 2024-07-21 DIAGNOSIS — B354 Tinea corporis: Secondary | ICD-10-CM | POA: Diagnosis not present

## 2024-07-21 DIAGNOSIS — G4733 Obstructive sleep apnea (adult) (pediatric): Secondary | ICD-10-CM | POA: Diagnosis not present

## 2024-07-21 DIAGNOSIS — G9340 Encephalopathy, unspecified: Secondary | ICD-10-CM | POA: Diagnosis not present

## 2024-07-21 DIAGNOSIS — G9341 Metabolic encephalopathy: Secondary | ICD-10-CM | POA: Diagnosis not present

## 2024-07-21 DIAGNOSIS — R278 Other lack of coordination: Secondary | ICD-10-CM | POA: Diagnosis not present

## 2024-07-21 DIAGNOSIS — B356 Tinea cruris: Secondary | ICD-10-CM | POA: Diagnosis not present

## 2024-07-21 DIAGNOSIS — J449 Chronic obstructive pulmonary disease, unspecified: Secondary | ICD-10-CM | POA: Diagnosis not present

## 2024-07-21 DIAGNOSIS — I10 Essential (primary) hypertension: Secondary | ICD-10-CM | POA: Diagnosis not present

## 2024-07-21 DIAGNOSIS — R2681 Unsteadiness on feet: Secondary | ICD-10-CM | POA: Diagnosis not present

## 2024-07-22 DIAGNOSIS — M6281 Muscle weakness (generalized): Secondary | ICD-10-CM | POA: Diagnosis not present

## 2024-07-22 DIAGNOSIS — G9341 Metabolic encephalopathy: Secondary | ICD-10-CM | POA: Diagnosis not present

## 2024-07-22 DIAGNOSIS — R278 Other lack of coordination: Secondary | ICD-10-CM | POA: Diagnosis not present

## 2024-07-22 DIAGNOSIS — I2602 Saddle embolus of pulmonary artery with acute cor pulmonale: Secondary | ICD-10-CM | POA: Diagnosis not present

## 2024-07-22 DIAGNOSIS — G9340 Encephalopathy, unspecified: Secondary | ICD-10-CM | POA: Diagnosis not present

## 2024-07-22 DIAGNOSIS — R2681 Unsteadiness on feet: Secondary | ICD-10-CM | POA: Diagnosis not present

## 2024-07-24 DIAGNOSIS — N390 Urinary tract infection, site not specified: Secondary | ICD-10-CM | POA: Diagnosis not present

## 2024-07-24 DIAGNOSIS — Z79899 Other long term (current) drug therapy: Secondary | ICD-10-CM | POA: Diagnosis not present

## 2024-07-30 DIAGNOSIS — Z79899 Other long term (current) drug therapy: Secondary | ICD-10-CM | POA: Diagnosis not present

## 2024-08-06 DIAGNOSIS — I6201 Nontraumatic acute subdural hemorrhage: Secondary | ICD-10-CM | POA: Diagnosis not present

## 2024-08-06 DIAGNOSIS — I10 Essential (primary) hypertension: Secondary | ICD-10-CM | POA: Diagnosis not present

## 2024-08-06 DIAGNOSIS — I4891 Unspecified atrial fibrillation: Secondary | ICD-10-CM | POA: Diagnosis not present
